# Patient Record
Sex: Female | Born: 1935 | Race: White | Hispanic: No | Smoking: Former smoker
Health system: Southern US, Community
[De-identification: ages and names within clinical notes are randomized; demographics above are authoritative.]

## PROBLEM LIST (undated history)

## (undated) DIAGNOSIS — J302 Other seasonal allergic rhinitis: Secondary | ICD-10-CM

## (undated) DIAGNOSIS — I2510 Atherosclerotic heart disease of native coronary artery without angina pectoris: Secondary | ICD-10-CM

## (undated) DIAGNOSIS — F32A Depression, unspecified: Secondary | ICD-10-CM

## (undated) DIAGNOSIS — R7303 Prediabetes: Secondary | ICD-10-CM

## (undated) DIAGNOSIS — I4891 Unspecified atrial fibrillation: Secondary | ICD-10-CM

## (undated) DIAGNOSIS — K5792 Diverticulitis of intestine, part unspecified, without perforation or abscess without bleeding: Secondary | ICD-10-CM

## (undated) DIAGNOSIS — N390 Urinary tract infection, site not specified: Secondary | ICD-10-CM

## (undated) DIAGNOSIS — E860 Dehydration: Secondary | ICD-10-CM

## (undated) DIAGNOSIS — G4730 Sleep apnea, unspecified: Secondary | ICD-10-CM

## (undated) DIAGNOSIS — C3490 Malignant neoplasm of unspecified part of unspecified bronchus or lung: Secondary | ICD-10-CM

## (undated) DIAGNOSIS — F329 Major depressive disorder, single episode, unspecified: Secondary | ICD-10-CM

## (undated) DIAGNOSIS — C50919 Malignant neoplasm of unspecified site of unspecified female breast: Secondary | ICD-10-CM

## (undated) DIAGNOSIS — J189 Pneumonia, unspecified organism: Secondary | ICD-10-CM

## (undated) DIAGNOSIS — M1990 Unspecified osteoarthritis, unspecified site: Secondary | ICD-10-CM

## (undated) DIAGNOSIS — IMO0001 Reserved for inherently not codable concepts without codable children: Secondary | ICD-10-CM

## (undated) DIAGNOSIS — R350 Frequency of micturition: Secondary | ICD-10-CM

## (undated) DIAGNOSIS — K219 Gastro-esophageal reflux disease without esophagitis: Secondary | ICD-10-CM

## (undated) DIAGNOSIS — E785 Hyperlipidemia, unspecified: Secondary | ICD-10-CM

## (undated) DIAGNOSIS — F0390 Unspecified dementia without behavioral disturbance: Secondary | ICD-10-CM

## (undated) DIAGNOSIS — I10 Essential (primary) hypertension: Secondary | ICD-10-CM

## (undated) DIAGNOSIS — F419 Anxiety disorder, unspecified: Secondary | ICD-10-CM

## (undated) HISTORY — PX: COLON SURGERY: SHX602

## (undated) HISTORY — DX: Prediabetes: R73.03

## (undated) HISTORY — PX: COLONOSCOPY: SHX174

## (undated) HISTORY — PX: CARDIOVERSION: SHX1299

## (undated) HISTORY — PX: BREAST SURGERY: SHX581

## (undated) HISTORY — PX: MASTECTOMY: SHX3

## (undated) HISTORY — DX: Other seasonal allergic rhinitis: J30.2

## (undated) HISTORY — DX: Depression, unspecified: F32.A

## (undated) HISTORY — DX: Dehydration: E86.0

## (undated) HISTORY — DX: Malignant neoplasm of unspecified part of unspecified bronchus or lung: C34.90

## (undated) HISTORY — DX: Diverticulitis of intestine, part unspecified, without perforation or abscess without bleeding: K57.92

## (undated) HISTORY — PX: TONSILLECTOMY: SUR1361

## (undated) HISTORY — DX: Atherosclerotic heart disease of native coronary artery without angina pectoris: I25.10

## (undated) HISTORY — DX: Unspecified atrial fibrillation: I48.91

## (undated) HISTORY — DX: Malignant neoplasm of unspecified site of unspecified female breast: C50.919

## (undated) HISTORY — DX: Major depressive disorder, single episode, unspecified: F32.9

## (undated) HISTORY — DX: Hyperlipidemia, unspecified: E78.5

## (undated) HISTORY — PX: OTHER SURGICAL HISTORY: SHX169

## (undated) HISTORY — DX: Essential (primary) hypertension: I10

---

## 2014-04-16 DIAGNOSIS — E785 Hyperlipidemia, unspecified: Secondary | ICD-10-CM | POA: Insufficient documentation

## 2014-04-16 DIAGNOSIS — K5792 Diverticulitis of intestine, part unspecified, without perforation or abscess without bleeding: Secondary | ICD-10-CM | POA: Insufficient documentation

## 2014-04-16 DIAGNOSIS — I2510 Atherosclerotic heart disease of native coronary artery without angina pectoris: Secondary | ICD-10-CM | POA: Insufficient documentation

## 2014-04-17 DIAGNOSIS — C50919 Malignant neoplasm of unspecified site of unspecified female breast: Secondary | ICD-10-CM | POA: Insufficient documentation

## 2014-04-17 DIAGNOSIS — R7303 Prediabetes: Secondary | ICD-10-CM | POA: Insufficient documentation

## 2014-05-27 ENCOUNTER — Encounter: Payer: Self-pay | Admitting: Internal Medicine

## 2014-05-27 ENCOUNTER — Other Ambulatory Visit: Payer: Self-pay | Admitting: Internal Medicine

## 2014-05-27 ENCOUNTER — Ambulatory Visit (INDEPENDENT_AMBULATORY_CARE_PROVIDER_SITE_OTHER): Payer: Medicare HMO | Admitting: Internal Medicine

## 2014-05-27 VITALS — BP 120/70 | HR 52 | Ht 65.5 in | Wt 169.1 lb

## 2014-05-27 DIAGNOSIS — L659 Nonscarring hair loss, unspecified: Secondary | ICD-10-CM

## 2014-05-27 DIAGNOSIS — I480 Paroxysmal atrial fibrillation: Secondary | ICD-10-CM

## 2014-05-27 DIAGNOSIS — Z79899 Other long term (current) drug therapy: Secondary | ICD-10-CM

## 2014-05-27 DIAGNOSIS — E785 Hyperlipidemia, unspecified: Secondary | ICD-10-CM | POA: Insufficient documentation

## 2014-05-27 DIAGNOSIS — I498 Other specified cardiac arrhythmias: Secondary | ICD-10-CM

## 2014-05-27 DIAGNOSIS — R0989 Other specified symptoms and signs involving the circulatory and respiratory systems: Secondary | ICD-10-CM

## 2014-05-27 DIAGNOSIS — I4891 Unspecified atrial fibrillation: Secondary | ICD-10-CM

## 2014-05-27 DIAGNOSIS — R001 Bradycardia, unspecified: Secondary | ICD-10-CM

## 2014-05-27 DIAGNOSIS — R0609 Other forms of dyspnea: Secondary | ICD-10-CM

## 2014-05-27 DIAGNOSIS — I10 Essential (primary) hypertension: Secondary | ICD-10-CM

## 2014-05-27 MED ORDER — APIXABAN 5 MG PO TABS: 5.0000 mg | ORAL_TABLET | Freq: Two times a day (BID) | ORAL | Status: DC

## 2014-05-27 MED ORDER — METOPROLOL TARTRATE 25 MG PO TABS: 25.0000 mg | ORAL_TABLET | Freq: Two times a day (BID) | ORAL | Status: DC

## 2014-05-27 NOTE — Telephone Encounter (Signed)
Rx refill sent to patient pharmacy   

## 2014-05-27 NOTE — Progress Notes (Signed)
OFFICE NOTE  Chief Complaint:  Establish with cardiologist  Primary Care Physician: Pamela Ou, MD  HPI:  Pamela Mcguire is a pleasant 79 year old female who recently moved from the [place] area back to [place]. She is here today to establish cardiology care. She apparently was recently hospitalized and quite ill in the intensive care unit. She was suffering from diverticulitis and went into atrial fibrillation. She does not recall many of the events however does report that she was cardioverted and started on Rythmol. She must is seen a cardiologist in the hospital however did not have cardiology followup. She has been maintained on this medication and is in sinus rhythm today. She was not started on anticoagulation which is very unusual. She does have a history of hypertension, age greater than 98 and dyslipidemia. Her CHADSVASC score is 2, therefore, long-term anticoagulation is indicated. There are also some discrepancies in her medications. It appears that she has 3 different cholesterol medications.  She denies any chest pain with exertion but does get short of breath. She denies any palpitations, presyncope or syncopal episodes. She reports that she never had a stress test. She denies suffering an MI in the hospital.  PMHx:  Past Medical History  Diagnosis Date  . Hyperlipidemia   . Diverticulitis   . CAD (coronary artery disease)   . Breast cancer     right mastectomy  . AF (atrial fibrillation)   . Hypertension   . Seasonal allergies   . Prediabetes     Past Surgical History  Procedure Laterality Date  . Cardioversion      FAMHx:  Family History  Problem Relation Age of Onset  . Family history unknown: Yes    SOCHx:   reports that she quit smoking about 20 years ago. She has never used smokeless tobacco. She reports that she drinks alcohol. She reports that she does not use illicit drugs.  ALLERGIES:  No Known Allergies  ROS: A comprehensive review  of systems was negative except for: Respiratory: positive for dyspnea on exertion  HOME MEDS: Current Outpatient Prescriptions  Medication Sig Dispense Refill  . Cholecalciferol (VITAMIN D PO) Take by mouth every morning.      . clonazePAM (KLONOPIN) 0.5 MG tablet Take 0.5 mg by mouth at bedtime.      . Cyanocobalamin (VITAMIN B-12 PO) Take 1 capsule by mouth every morning.      . escitalopram (LEXAPRO) 10 MG tablet Take 10 mg by mouth daily.      Marland Kitchen ezetimibe-simvastatin (VYTORIN) 10-40 MG per tablet Take 1 tablet by mouth daily.      Marland Kitchen FOLIC ACID PO Take 1 tablet by mouth 2 (two) times daily.      . Ginger, Zingiber officinalis, (GINGER PO) Take by mouth 2 (two) times daily.      Marland Kitchen loratadine (CLARITIN) 10 MG tablet Take 10 mg by mouth at bedtime as needed for allergies.      . magnesium chloride (SLOW-MAG) 64 MG TBEC SR tablet Take 1 tablet by mouth 3 (three) times daily.      . Omega-3 Fatty Acids (FISH OIL) 1200 MG CAPS Take 2 capsules by mouth daily.      . pantoprazole (PROTONIX) 40 MG tablet Take 40 mg by mouth daily.      . Probiotic Product (PROBIOTIC PO) Take 1 capsule by mouth 2 (two) times daily.      . propafenone (RYTHMOL) 150 MG tablet Take 150 mg by mouth 2 (two)  times daily.      . traZODone (DESYREL) 50 MG tablet Take 50-100 mg by mouth at bedtime.      Marland Kitchen apixaban (ELIQUIS) 5 MG TABS tablet Take 1 tablet (5 mg total) by mouth 2 (two) times daily.  60 tablet  6  . metoprolol tartrate (LOPRESSOR) 25 MG tablet TAKE 1 TABLET BY MOUTH TWICE DAILY  180 tablet  3   No current facility-administered medications for this visit.    LABS/IMAGING: No results found for this or any previous visit (from the past 48 hour(s)). No results found.  VITALS: BP 120/70  Pulse 52  Ht 5' 5.5" (1.664 m)  Wt 169 lb 1.6 oz (76.703 kg)  BMI 27.70 kg/m2  EXAM: General appearance: alert and no distress Neck: no carotid bruit and no JVD Lungs: clear to auscultation bilaterally Heart: regular  rate and rhythm, S1, S2 normal, no murmur, click, rub or gallop Abdomen: soft, non-tender; bowel sounds normal; no masses,  no organomegaly Extremities: extremities normal, atraumatic, no cyanosis or edema Pulses: 2+ and symmetric Skin: Skin color, texture, turgor normal. No rashes or lesions Neurologic: Grossly normal Psych: Mood, affect normal  EKG: Deferred  ASSESSMENT: 1. Paroxysmal atrial fibrillation-maintaining sinus on Rythmol 2. Hypertension-controlled 3. Dyslipidemia-on multiple statins 4. Dyspnea on exertion 5. Relative bradycardia-fatigue  PLAN: 1.   [name] had paroxysmal atrial fibrillation which may have been related to being sick although she was unaware she was out of rhythm. She apparently was cardioverted and started on Rythmol but is on no anticoagulations. This is very unusual as the risk of thrombus does go up after cardioversion. She does have a CHADSVASC score of 2 therefore I would recommend anticoagulation. I will start eloquence 5 mg twice a day. I counseled her today and side effects of the medication and things to watch for. We've given her a card for free 30 day supply. I would like her to see Erasmo Downer in our anticoagulation pharmacist for a more thorough explanation of anticoagulant therapy. She is not currently on aspirin and I would recommend against that. Her blood pressure is controlled on her current medications she does report some fatigue and recent hair loss which may be related to the beta blocker. I recommend decreasing her metoprolol to 25 mg twice daily. She should discontinue atorvastatin and simvastatin and remain on Vytorin, which apparently is being paid for by her insurance company. Finally, she will need a stress test for coronary ischemia evaluation as a possible cause of her A. fib as well as to assure that she is not at increased risk for pro arrhythmias on Rythmol. I would recommend a LexiScan nuclear stress test.  Plan to see her back to  review her records as well as the results of her stress test and to see she's had improvement in her bradycardia and fatigue as well as some of her hair loss in about a month.  Thank you for allowing to participate in her care.  Pixie Casino, MD, [facility] Attending Cardiologist [facility]  HILTY,Kenneth C 05/27/2014, 4:52 PM

## 2014-05-27 NOTE — Patient Instructions (Signed)
Your physician has requested that you have a lexiscan myoview. For further information please visit [email or web address]. Please follow instruction sheet, as given.  Your physician has recommended you make the following change in your medication... 1. STOP atorvastatin and simvastatin (continue vytorin - for cholesterol) 2. DECREASE metoprolol to 25mg  twice daily 3. START eliquis 5mg  twice daily (every 12 hours) - blood thinner  Please have FASTING lab work at [facility] (to check cholesterol)  Please schedule appointment with Pamela Mcguire or Pamela Mcguire (pharmacists) to discuss eliquis  Please schedule an appointment with Dr. Debara Pickett - after your stress test.

## 2014-06-05 ENCOUNTER — Encounter (HOSPITAL_COMMUNITY): Payer: Medicare HMO

## 2014-06-05 ENCOUNTER — Telehealth: Payer: Self-pay | Admitting: *Deleted

## 2014-06-05 NOTE — Telephone Encounter (Signed)
Faxed OV note to get lexiscan myoview approved with [employer]  Dyspnea, new-onset AF, starting antiarrythmic medications

## 2014-06-06 ENCOUNTER — Telehealth (HOSPITAL_COMMUNITY): Payer: Self-pay

## 2014-06-06 NOTE — Telephone Encounter (Signed)
Encounter complete. 

## 2014-06-07 LAB — LIPID PANEL
Cholesterol: 191 mg/dL (ref 0–200)
HDL: 44 mg/dL (ref >39)
LDL Cholesterol: 122 mg/dL — ABNORMAL HIGH (ref 0–99)
TRIGLYCERIDES: 123 mg/dL (ref <150)
Total CHOL/HDL Ratio: 4.3 Ratio
VLDL: 25 mg/dL (ref 0–40)

## 2014-06-10 ENCOUNTER — Telehealth (HOSPITAL_COMMUNITY): Payer: Self-pay

## 2014-06-10 NOTE — Telephone Encounter (Signed)
Encounter complete. 

## 2014-06-11 ENCOUNTER — Ambulatory Visit (INDEPENDENT_AMBULATORY_CARE_PROVIDER_SITE_OTHER): Payer: Medicare HMO | Admitting: Pharmacist

## 2014-06-11 ENCOUNTER — Ambulatory Visit (HOSPITAL_COMMUNITY)
Admission: RE | Admit: 2014-06-11 | Discharge: 2014-06-11 | Disposition: A | Discharge status: Home / Self Care | Payer: Medicare HMO | Source: Ambulatory Visit | Attending: Internal Medicine | Admitting: Internal Medicine

## 2014-06-11 DIAGNOSIS — I4891 Unspecified atrial fibrillation: Secondary | ICD-10-CM

## 2014-06-11 DIAGNOSIS — Z79899 Other long term (current) drug therapy: Secondary | ICD-10-CM

## 2014-06-11 DIAGNOSIS — R0989 Other specified symptoms and signs involving the circulatory and respiratory systems: Secondary | ICD-10-CM | POA: Insufficient documentation

## 2014-06-11 DIAGNOSIS — R0609 Other forms of dyspnea: Secondary | ICD-10-CM | POA: Insufficient documentation

## 2014-06-11 LAB — CBC
HCT: 39.1 % (ref 36.0–46.0)
Hemoglobin: 13.2 g/dL (ref 12.0–15.0)
MCH: 28.8 pg (ref 26.0–34.0)
MCHC: 33.8 g/dL (ref 30.0–36.0)
MCV: 85.2 fL (ref 78.0–100.0)
PLATELETS: 297 10*3/uL (ref 150–400)
RBC: 4.59 MIL/uL (ref 3.87–5.11)
RDW: 14.8 % (ref 11.5–15.5)
WBC: 7.1 10*3/uL (ref 4.0–10.5)

## 2014-06-11 MED ORDER — TECHNETIUM TC 99M SESTAMIBI GENERIC - CARDIOLITE
29.8000 | Freq: Once | INTRAVENOUS | Status: AC | PRN
  Administered 2014-06-11: 29.8 via INTRAVENOUS

## 2014-06-11 MED ORDER — TECHNETIUM TC 99M SESTAMIBI GENERIC - CARDIOLITE
10.6000 | Freq: Once | INTRAVENOUS | Status: AC | PRN
  Administered 2014-06-11: 11 via INTRAVENOUS

## 2014-06-11 MED ORDER — REGADENOSON 0.4 MG/5ML IV SOLN
0.4000 mg | Freq: Once | INTRAVENOUS | Status: AC
  Administered 2014-06-11: 0.4 mg via INTRAVENOUS

## 2014-06-11 MED ORDER — AMINOPHYLLINE 25 MG/ML IV SOLN
75.0000 mg | Freq: Once | INTRAVENOUS | Status: AC
  Administered 2014-06-11: 75 mg via INTRAVENOUS

## 2014-06-11 NOTE — Progress Notes (Signed)
Pt was started on Eliquis for Atrial Fibrillation on May 27, 2014.     Reviewed patients medication list.  Pt is not currently on any combined P-gp and strong CYP3A4 inhibitors/inducers (ketoconazole, traconazole, ritonavir, carbamazepine, phenytoin, rifampin, St. John's wort).  She is taking Ginger, which can increase bleeding risk.   Reviewed labs.  SCr 0.91 , Weight 76 kg, CrCl- 62 mL/min.  Dose appropriate based on CrCl.   Hgb and HCT Within Normal Limits  A full discussion of the nature of anticoagulants has been carried out.  A benefit/risk analysis has been presented to the patient, so that they understand the justification for choosing anticoagulation with Eliquis at this time.  The need for compliance is stressed.  Pt is aware to take the medication twice daily.  Side effects of potential bleeding are discussed, including unusual colored urine or stools, coughing up blood or coffee ground emesis, nose bleeds or serious fall or head trauma.  Discussed signs and symptoms of stroke. The patient should avoid any OTC items containing aspirin or ibuprofen.  Avoid alcohol consumption.   Call if any signs of abnormal bleeding.  Discussed financial obligations and resolved any difficulty in obtaining medication.  Next lab test test in 6 months.

## 2014-06-11 NOTE — Procedures (Addendum)
[place] [address] [place] [address] [phone]  Cardiology Nuclear Med Study  Pamela Mcguire is a 79 y.o. female     MRN : [id number]     DOB: August 20, 1935  Procedure Date: 06/11/2014  Nuclear Med Background Indication for Stress Test:  Evaluation for Ischemia and [facility] History:  CAD;PAF -cardioversion;bradycardia;DVT;No prior respiratory history reported;No prior NUC MPI for comparison. Cardiac Risk Factors: Family History - CAD, History of Smoking, Hypertension, Lipids and Overweight  Symptoms:  Chest Pain, Dizziness, DOE, Fatigue, Light-Headedness, SOB and weakness   Nuclear Pre-Procedure Caffeine/Decaff Intake:  1:00am NPO After: 11am   IV Site: L Forearm  IV 0.9% NS with Angio Cath:  22g  Chest Size (in):  n/a IV Started by: Pamela Course, RN  Height: 5\' 6"  (1.676 m)  Cup Size: C- s/p right mastectomy/lymphectomy;No sticks right arm.  BMI:  Body mass index is 27.29 kg/(m^2). Weight:  169 lb (76.658 kg)   Tech Comments:  NO STICKS RIGHT ARM     Nuclear Med Study 1 or 2 day study: 1 day  Stress Test Type:  Lexiscan  Order Authorizing Provider:  Lyman Bishop, MD   Resting Radionuclide: Technetium 16m Sestamibi  Resting Radionuclide Dose: 10.6 mCi   Stress Radionuclide:  Thallous Chloride Tl201  Stress Radionuclide Dose: 29.8 mCi           Stress Protocol Rest HR: 50 Stress HR: 63  Rest BP: 185/88 Stress BP: 178/88  Exercise Time (min): n/a METS: n/a   Predicted Max HR: 143 bpm % Max HR: 44.06 bpm Rate Pressure Product: 71696  Dose of Adenosine (mg):  n/a Dose of Lexiscan: 0.4 mg  Dose of Atropine (mg): n/a Dose of Dobutamine: n/a mcg/kg/min (at max HR)  Stress Test Technologist: Pamela Mcguire, CCT Nuclear Technologist: Pamela Mcguire, CNMT   Rest Procedure:  Myocardial perfusion imaging was performed at rest 45 minutes following the intravenous administration of Technetium 43m  Sestamibi. Stress Procedure:  The patient received IV Lexiscan 0.4 mg over 15-seconds.  Technetium 26m Sestamibi injected IV at 30-seconds.  Patient experienced SOB and 75 mg of Aminophylline IV was administered. There were no significant changes with Lexiscan.  Quantitative spect images were obtained after a 45 minute delay.  Transient Ischemic Dilatation (Normal <1.22):  1.02  QGS EDV:  63 ml QGS ESV:  19 ml LV Ejection Fraction: 69%  Rest ECG: NSR - Normal EKG  Stress ECG: No significant change from baseline ECG  QPS Raw Data Images:  There is interference from nuclear activity from structures below the diaphragm. This does not affect the ability to read the study. Stress Images:  Normal homogeneous uptake in all areas of the myocardium. Rest Images:  Normal homogeneous uptake in all areas of the myocardium. Subtraction (SDS):  No evidence of ischemia.  Impression Exercise Capacity:  Lexiscan with no exercise. BP Response:  Normal blood pressure response. Clinical Symptoms:  There is dyspnea. ECG Impression:  No significant ECG changes with Lexiscan. Comparison with Prior Nuclear Study: No previous nuclear study performed  Overall Impression:  Normal stress nuclear study.  LV Wall Motion:  NL LV Function; NL Wall Motion; EF 69%  Pixie Casino, MD, [facility] Board Certified in Nuclear Cardiology Attending Cardiologist [place], MD  06/11/2014 4:34 PM

## 2014-06-12 LAB — BASIC METABOLIC PANEL
BUN: 15 mg/dL (ref 6–23)
CO2: 21 meq/L (ref 19–32)
CREATININE: 0.91 mg/dL (ref 0.50–1.10)
Calcium: 9.8 mg/dL (ref 8.4–10.5)
Chloride: 102 mEq/L (ref 96–112)
GLUCOSE: 118 mg/dL — AB (ref 70–99)
Potassium: 5.6 mEq/L — ABNORMAL HIGH (ref 3.5–5.3)
Sodium: 138 mEq/L (ref 135–145)

## 2014-06-12 NOTE — Progress Notes (Signed)
LMTCB for lab/stress test results.

## 2014-06-13 NOTE — Progress Notes (Signed)
LM on daughter Donna's # to return call for lab and test results.

## 2014-07-02 ENCOUNTER — Telehealth: Payer: Self-pay | Admitting: Internal Medicine

## 2014-07-02 NOTE — Telephone Encounter (Signed)
Pt would lie some samples of Eliquis 5 mg and Vytorin please. She did not know the mg for Vytorin.

## 2014-07-02 NOTE — Telephone Encounter (Signed)
Spoke with patient's daughter and informed her samples of Vytorin would be at front desk, we are currently out of Eliquis. Told her she could call [address] and check. Patient is leaving for [place] on Thursday and would really like to have some before she goes, co-pay is very expansive.

## 2014-07-09 ENCOUNTER — Ambulatory Visit: Payer: Medicare HMO | Admitting: Internal Medicine

## 2014-08-12 ENCOUNTER — Ambulatory Visit (INDEPENDENT_AMBULATORY_CARE_PROVIDER_SITE_OTHER): Payer: Medicare HMO | Admitting: Internal Medicine

## 2014-08-12 ENCOUNTER — Encounter: Payer: Self-pay | Admitting: Internal Medicine

## 2014-08-12 VITALS — BP 153/66 | HR 57 | Ht 65.5 in | Wt 163.9 lb

## 2014-08-12 DIAGNOSIS — I498 Other specified cardiac arrhythmias: Secondary | ICD-10-CM

## 2014-08-12 DIAGNOSIS — G4710 Hypersomnia, unspecified: Secondary | ICD-10-CM

## 2014-08-12 DIAGNOSIS — R0609 Other forms of dyspnea: Secondary | ICD-10-CM

## 2014-08-12 DIAGNOSIS — Z79899 Other long term (current) drug therapy: Secondary | ICD-10-CM

## 2014-08-12 DIAGNOSIS — E785 Hyperlipidemia, unspecified: Secondary | ICD-10-CM

## 2014-08-12 DIAGNOSIS — I480 Paroxysmal atrial fibrillation: Secondary | ICD-10-CM

## 2014-08-12 DIAGNOSIS — R001 Bradycardia, unspecified: Secondary | ICD-10-CM

## 2014-08-12 DIAGNOSIS — R5383 Other fatigue: Secondary | ICD-10-CM

## 2014-08-12 DIAGNOSIS — I4891 Unspecified atrial fibrillation: Secondary | ICD-10-CM

## 2014-08-12 DIAGNOSIS — R0683 Snoring: Secondary | ICD-10-CM

## 2014-08-12 DIAGNOSIS — I10 Essential (primary) hypertension: Secondary | ICD-10-CM

## 2014-08-12 DIAGNOSIS — R0989 Other specified symptoms and signs involving the circulatory and respiratory systems: Secondary | ICD-10-CM

## 2014-08-12 DIAGNOSIS — R5381 Other malaise: Secondary | ICD-10-CM

## 2014-08-12 DIAGNOSIS — G4719 Other hypersomnia: Secondary | ICD-10-CM

## 2014-08-12 MED ORDER — ZOLPIDEM TARTRATE 5 MG PO TABS: 5.0000 mg | ORAL_TABLET | Freq: Every evening | ORAL | Status: DC | PRN

## 2014-08-12 MED ORDER — APIXABAN 5 MG PO TABS: 5.0000 mg | ORAL_TABLET | Freq: Two times a day (BID) | ORAL | Status: DC

## 2014-08-12 MED ORDER — EZETIMIBE-SIMVASTATIN 10-40 MG PO TABS: 1.0000 | ORAL_TABLET | Freq: Every day | ORAL | Status: DC

## 2014-08-12 NOTE — Progress Notes (Signed)
OFFICE NOTE  Chief Complaint:  Establish with cardiologist  Primary Care Physician: Florina Ou, MD  HPI:  Pamela Mcguire is a pleasant 79 year old female who recently moved from the [place] area back to [place]. She is here today to establish cardiology care. She apparently was recently hospitalized and quite ill in the intensive care unit. She was suffering from diverticulitis and went into atrial fibrillation. She does not recall many of the events however does report that she was cardioverted and started on Rythmol. She must is seen a cardiologist in the hospital however did not have cardiology followup. She has been maintained on this medication and is in sinus rhythm today. She was not started on anticoagulation which is very unusual. She does have a history of hypertension, age greater than 54 and dyslipidemia. Her CHADSVASC score is 2, therefore, long-term anticoagulation is indicated. There are also some discrepancies in her medications. It appears that she has 3 different cholesterol medications.  She denies any chest pain with exertion but does get short of breath. She denies any palpitations, presyncope or syncopal episodes. She reports that she never had a stress test. She denies suffering an MI in the hospital.  Ms. Fomby returns today for followup. I'm pleased to report her nuclear stress test was negative for ischemia. She has since started Eliquis and is tolerating it well without bleeding problems. We simplified her cholesterol medications and she is currently on Vytorin. She does report poor sleep at night and napping during the day. She says she has to urinate several times at night. She is noted to have a history of snoring but is unclear whether she has any true apnea. She often has trouble falling asleep as well as staying asleep.  PMHx:  Past Medical History  Diagnosis Date  . Hyperlipidemia   . Diverticulitis   . CAD (coronary artery disease)   . Breast  cancer     right mastectomy  . AF (atrial fibrillation)   . Hypertension   . Seasonal allergies   . Prediabetes     Past Surgical History  Procedure Laterality Date  . Cardioversion      FAMHx:  No family history on file.  SOCHx:   reports that she quit smoking about 20 years ago. She has never used smokeless tobacco. She reports that she drinks alcohol. She reports that she does not use illicit drugs.  ALLERGIES:  No Known Allergies  ROS: A comprehensive review of systems was negative except for: Constitutional: positive for insomnia Respiratory: positive for dyspnea on exertion  HOME MEDS: Current Outpatient Prescriptions  Medication Sig Dispense Refill  . apixaban (ELIQUIS) 5 MG TABS tablet Take 1 tablet (5 mg total) by mouth 2 (two) times daily.  56 tablet  0  . Cholecalciferol (VITAMIN D PO) Take by mouth every morning.      . clonazePAM (KLONOPIN) 0.5 MG tablet Take 0.5 mg by mouth at bedtime.      . Cyanocobalamin (VITAMIN B-12 PO) Take 1 capsule by mouth every morning.      . escitalopram (LEXAPRO) 10 MG tablet Take 10 mg by mouth daily.      Marland Kitchen ezetimibe-simvastatin (VYTORIN) 10-40 MG per tablet Take 1 tablet by mouth daily.  28 tablet  0  . FOLIC ACID PO Take 1 tablet by mouth 2 (two) times daily.      Marland Kitchen loratadine (CLARITIN) 10 MG tablet Take 10 mg by mouth at bedtime as needed for allergies.      Marland Kitchen  magnesium chloride (SLOW-MAG) 64 MG TBEC SR tablet Take 1 tablet by mouth 3 (three) times daily.      . metoprolol tartrate (LOPRESSOR) 25 MG tablet TAKE 1 TABLET BY MOUTH TWICE DAILY  180 tablet  3  . Omega-3 Fatty Acids (FISH OIL) 1200 MG CAPS Take 2 capsules by mouth daily.      . pantoprazole (PROTONIX) 40 MG tablet Take 40 mg by mouth daily.      . Probiotic Product (PROBIOTIC PO) Take 1 capsule by mouth 2 (two) times daily.      . propafenone (RYTHMOL) 150 MG tablet Take 150 mg by mouth 2 (two) times daily.      . traZODone (DESYREL) 50 MG tablet Take 50-100 mg  by mouth at bedtime.      Marland Kitchen zolpidem (AMBIEN) 5 MG tablet Take 1 tablet (5 mg total) by mouth at bedtime as needed for sleep.  1 tablet  0   No current facility-administered medications for this visit.    LABS/IMAGING: No results found for this or any previous visit (from the past 48 hour(s)). No results found.  VITALS: BP 153/66  Pulse 57  Ht 5' 5.5" (1.664 m)  Wt 163 lb 14.4 oz (74.345 kg)  BMI 26.85 kg/m2  EXAM: deferred  EKG: Deferred  ASSESSMENT: 1. Paroxysmal atrial fibrillation-maintaining sinus on Rythmol 2. Hypertension-controlled 3. Dyslipidemia-on multiple statins 4. Dyspnea on exertion 5. Relative bradycardia-fatigue 6. Insomnia/somnolence  PLAN: 1.   Pamela Mcguire had a negative nuclear stress test which is reassuring. She is maintaining sinus rhythm on Rythmol. Her blood pressure is well controlled. Cholesterol was noted to be slightly high with an LDL in the 120s, however she just restarted her Vytorin. This will need to be looked at again in 6 months to year. She is having trouble with sleep at night.  I've recommended a sleep study as there is a high correlation with atrial fibrillation and sleep apnea. We will provide a single dose Ambien to take prior to her sleep study in order to help her sleep.  Followup in 6 months.  Pixie Casino, MD, [facility] Attending Cardiologist [facility]  Juniper Snyders C 08/12/2014, 4:11 PM

## 2014-08-12 NOTE — Patient Instructions (Signed)
Your physician has recommended that you have a sleep study. This test records several body functions during sleep, including: brain activity, eye movement, oxygen and carbon dioxide blood levels, heart rate and rhythm, breathing rate and rhythm, the flow of air through your mouth and nose, snoring, body muscle movements, and chest and belly movement. ** this is done at [place] wants you to follow-up in: 6 months with Dr. Debara Pickett. You will receive a reminder letter in the mail two months in advance. If you don't receive a letter, please call our office to schedule the follow-up appointment.

## 2014-08-14 ENCOUNTER — Ambulatory Visit: Payer: Medicare HMO | Admitting: Internal Medicine

## 2014-09-16 ENCOUNTER — Telehealth: Payer: Self-pay | Admitting: Internal Medicine

## 2014-09-16 ENCOUNTER — Telehealth: Payer: Self-pay | Admitting: *Deleted

## 2014-09-16 ENCOUNTER — Other Ambulatory Visit: Payer: Self-pay | Admitting: Internal Medicine

## 2014-09-16 NOTE — Telephone Encounter (Signed)
Pt. called and message left for her to call me back, I did leave a message with the time to be here tomorrow 11:15am for a 11:30 am appt to see Dr. Sallyanne Kuster for Bradycardia

## 2014-09-16 NOTE — Telephone Encounter (Signed)
Pamela Mcguire called in stating that she would like to pick up some Vytorin Samples tomorrow when her mom comes in the morning.

## 2014-09-16 NOTE — Telephone Encounter (Signed)
[name] called in stating the while the pt was at he doctors office, her heart rate began to drop. The PA ran a EKG on her and they found that she had bradychardia. The PA suggested that she be seen as soon as possible. Please call  thanks

## 2014-09-16 NOTE — Telephone Encounter (Signed)
Received call from [facility] who was working up patient at PCP office - EKG performed - sinus bradycardia - patient complains of dizziness with position changes - HR 52  Bradycardia is NOT new for patient. They will fax over EKG for provider to review. Patient has no other complaints.

## 2014-09-17 ENCOUNTER — Ambulatory Visit (INDEPENDENT_AMBULATORY_CARE_PROVIDER_SITE_OTHER): Payer: Medicare HMO | Admitting: Cardiovascular Disease

## 2014-09-17 ENCOUNTER — Encounter: Payer: Self-pay | Admitting: Cardiovascular Disease

## 2014-09-17 VITALS — BP 152/68 | HR 64 | Resp 16 | Ht 65.0 in | Wt 168.1 lb

## 2014-09-17 DIAGNOSIS — E785 Hyperlipidemia, unspecified: Secondary | ICD-10-CM

## 2014-09-17 DIAGNOSIS — Z79899 Other long term (current) drug therapy: Secondary | ICD-10-CM

## 2014-09-17 DIAGNOSIS — I10 Essential (primary) hypertension: Secondary | ICD-10-CM

## 2014-09-17 DIAGNOSIS — R001 Bradycardia, unspecified: Secondary | ICD-10-CM

## 2014-09-17 DIAGNOSIS — I480 Paroxysmal atrial fibrillation: Secondary | ICD-10-CM

## 2014-09-17 MED ORDER — EZETIMIBE-SIMVASTATIN 10-40 MG PO TABS: 1.0000 | ORAL_TABLET | Freq: Every day | ORAL | Status: DC

## 2014-09-17 NOTE — Patient Instructions (Signed)
Your physician has recommended you make the following change in your medication: STOP RYTHMOL.  Your physician recommends that you schedule a follow-up appointment in: As scheduled with Dr. Debara Pickett.

## 2014-09-17 NOTE — Telephone Encounter (Signed)
Samples will be left in Dr. Victorino December pod for patient to pick up at appointment

## 2014-09-17 NOTE — Telephone Encounter (Signed)
Rx was sent to pharmacy electronically. 

## 2014-09-18 ENCOUNTER — Encounter: Payer: Self-pay | Admitting: Cardiovascular Disease

## 2014-09-18 NOTE — Progress Notes (Signed)
Patient ID: Pamela Mcguire, female   DOB: 1935/10/05, 79 y.o.   MRN: [id number]     Reason for office visit Bradycardia, PAF  Pamela Mcguire is Dr. Lysbeth Penner patient, but is seen today for fatigue and bradycardia.  Pamela Mcguire was hospitalized with acute diverticulitis when she had her only known episode of PAF earlier this year. She has not had any recurrence on Rythmol and metoprolol. She takes Eliquis, without bleeding or neurological events. She has treated HTN and hyperlipidemia. Normal Lexiscan Myoview in July. Normal LV function, not sure about left atrium size.  She presented yesterday with fatigue, sleepiness and her ECG showed sinus bradycardia in the low 50s. Today she is better and her heart rate is 64.   No Known Allergies  Current Outpatient Prescriptions  Medication Sig Dispense Refill  . apixaban (ELIQUIS) 5 MG TABS tablet Take 1 tablet (5 mg total) by mouth 2 (two) times daily.  56 tablet  0  . Cholecalciferol (VITAMIN D PO) Take by mouth every morning.      . Cyanocobalamin (VITAMIN B-12 PO) Take 1 capsule by mouth every morning.      . escitalopram (LEXAPRO) 10 MG tablet Take 10 mg by mouth daily.      Marland Kitchen ezetimibe-simvastatin (VYTORIN) 10-40 MG per tablet Take 1 tablet by mouth daily.  28 tablet  0  . FOLIC ACID PO Take 1 tablet by mouth 2 (two) times daily.      Marland Kitchen loratadine (CLARITIN) 10 MG tablet Take 10 mg by mouth at bedtime as needed for allergies.      . magnesium chloride (SLOW-MAG) 64 MG TBEC SR tablet Take 1 tablet by mouth 3 (three) times daily.      . metoprolol tartrate (LOPRESSOR) 25 MG tablet TAKE 1 TABLET BY MOUTH TWICE DAILY  180 tablet  3  . Omega-3 Fatty Acids (FISH OIL) 1200 MG CAPS Take 2 capsules by mouth daily.      . pantoprazole (PROTONIX) 40 MG tablet Take 40 mg by mouth daily.      . Probiotic Product (PROBIOTIC PO) Take 1 capsule by mouth 2 (two) times daily.      . traZODone (DESYREL) 50 MG tablet Take 50-100 mg by mouth at bedtime.      Marland Kitchen  zolpidem (AMBIEN) 5 MG tablet Take 1 tablet (5 mg total) by mouth at bedtime as needed for sleep.  1 tablet  0  . metoprolol tartrate (LOPRESSOR) 25 MG tablet TAKE 1 TABLET BY MOUTH TWICE DAILY  60 tablet  6   No current facility-administered medications for this visit.    Past Medical History  Diagnosis Date  . Hyperlipidemia   . Diverticulitis   . CAD (coronary artery disease)   . Breast cancer     right mastectomy  . AF (atrial fibrillation)   . Hypertension   . Seasonal allergies   . Prediabetes     Past Surgical History  Procedure Laterality Date  . Cardioversion      No family history on file.  History   Social History  . Marital Status: Widowed    Spouse Name: N/A    Number of Children: N/A  . Years of Education: N/A   Occupational History  . Not on file.   Social History Main Topics  . Smoking status: Former Smoker -- 20 years    Quit date: 05/27/1994  . Smokeless tobacco: Never Used  . Alcohol Use: Yes     Comment: 1/2 gallon red  wine/week - limited to special occasions (05/27/14)  . Drug Use: No  . Sexual Activity: Not on file   Other Topics Concern  . Not on file   Social History Narrative  . No narrative on file    Review of systems: Fatigue, chronic class II exertional dyspnea. The patient specifically denies any chest pain at rest or with exertion, dyspnea at rest, orthopnea, paroxysmal nocturnal dyspnea, syncope, palpitations, focal neurological deficits, intermittent claudication, lower extremity edema, unexplained weight gain, cough, hemoptysis or wheezing.  The patient also denies abdominal pain, nausea, vomiting, dysphagia, diarrhea, constipation, polyuria, polydipsia, dysuria, hematuria, frequency, urgency, abnormal bleeding or bruising, fever, chills, unexpected weight changes, mood swings, change in skin or hair texture, change in voice quality, auditory or visual problems, allergic reactions or rashes, new musculoskeletal complaints other  than usual "aches and pains".   PHYSICAL EXAM BP 152/68  Pulse 64  Resp 16  Ht 5\' 5"  (1.651 m)  Wt 168 lb 1.6 oz (76.25 kg)  BMI 27.97 kg/m2  General: Alert, oriented x3, no distress Head: no evidence of trauma, PERRL, EOMI, no exophtalmos or lid lag, no myxedema, no xanthelasma; normal ears, nose and oropharynx Neck: normal jugular venous pulsations and no hepatojugular reflux; brisk carotid pulses without delay and no carotid bruits Chest: clear to auscultation, no signs of consolidation by percussion or palpation, normal fremitus, symmetrical and full respiratory excursions Cardiovascular: normal position and quality of the apical impulse, regular rhythm, normal first and second heart sounds, no murmurs, rubs or gallops Abdomen: no tenderness or distention, no masses by palpation, no abnormal pulsatility or arterial bruits, normal bowel sounds, no hepatosplenomegaly Extremities: no clubbing, cyanosis or edema; 2+ radial, ulnar and brachial pulses bilaterally; 2+ right femoral, posterior tibial and dorsalis pedis pulses; 2+ left femoral, posterior tibial and dorsalis pedis pulses; no subclavian or femoral bruits Neurological: grossly nonfocal   EKG: sinus rhythm (yesterday sinus brady)  Lipid Panel     Component Value Date/Time   CHOL 191 06/06/2014 1108   TRIG 123 06/06/2014 1108   HDL 44 06/06/2014 1108   CHOLHDL 4.3 06/06/2014 1108   VLDL 25 06/06/2014 1108   LDLCALC 122* 06/06/2014 1108    BMET    Component Value Date/Time   NA 138 06/11/2014 1608   K 5.6* 06/11/2014 1608   CL 102 06/11/2014 1608   CO2 21 06/11/2014 1608   GLUCOSE 118* 06/11/2014 1608   BUN 15 06/11/2014 1608   CREATININE 0.91 06/11/2014 1608   CALCIUM 9.8 06/11/2014 1608     ASSESSMENT AND PLAN  Pamela Mcguire has symptomatic medication-induced bradycardia. She has not had recurrence of AF and her only known event occurred during an acute non-cardiac illness.  Stop Rythmol. Continue low dose beta blocker and  anticoagulation. If she has recurrent AF with RVR, may need to consider pacemaker therapy to allow antiarrhythmics.    Orders Placed This Encounter  Procedures  . EKG 12-Lead   No orders of the defined types were placed in this encounter.    Holli Humbles, MD, [place] [phone] office [phone] pager

## 2014-09-21 DIAGNOSIS — E538 Deficiency of other specified B group vitamins: Secondary | ICD-10-CM | POA: Insufficient documentation

## 2014-10-15 ENCOUNTER — Telehealth: Payer: Self-pay | Admitting: Internal Medicine

## 2014-10-15 MED ORDER — EZETIMIBE-SIMVASTATIN 10-40 MG PO TABS: 1.0000 | ORAL_TABLET | Freq: Every day | ORAL | Status: DC

## 2014-10-15 NOTE — Telephone Encounter (Signed)
(  2) samples of VYTORIN 10/40 NOTIFIED Ms Marjo Bicker pick up

## 2014-10-15 NOTE — Telephone Encounter (Signed)
Pt would like some samples of Vytorin 10/40 please.

## 2014-10-28 ENCOUNTER — Telehealth: Payer: Self-pay | Admitting: Internal Medicine

## 2014-10-28 NOTE — Telephone Encounter (Signed)
Pt would like some samples of Vytorin 10/40 please.

## 2014-10-29 MED ORDER — EZETIMIBE-SIMVASTATIN 10-40 MG PO TABS: 1.0000 | ORAL_TABLET | Freq: Every day | ORAL | Status: DC

## 2014-11-03 ENCOUNTER — Ambulatory Visit (HOSPITAL_BASED_OUTPATIENT_CLINIC_OR_DEPARTMENT_OTHER): Payer: Medicare HMO | Attending: Internal Medicine | Admitting: Radiology

## 2014-11-03 ENCOUNTER — Ambulatory Visit: Payer: Medicare HMO | Admitting: Cardiovascular Disease

## 2014-11-03 VITALS — Ht 65.0 in | Wt 170.0 lb

## 2014-11-03 DIAGNOSIS — Z6828 Body mass index (BMI) 28.0-28.9, adult: Secondary | ICD-10-CM | POA: Insufficient documentation

## 2014-11-03 DIAGNOSIS — R0683 Snoring: Secondary | ICD-10-CM | POA: Diagnosis present

## 2014-11-03 DIAGNOSIS — R5383 Other fatigue: Secondary | ICD-10-CM | POA: Insufficient documentation

## 2014-11-03 DIAGNOSIS — G4733 Obstructive sleep apnea (adult) (pediatric): Secondary | ICD-10-CM

## 2014-11-03 DIAGNOSIS — I10 Essential (primary) hypertension: Secondary | ICD-10-CM | POA: Diagnosis not present

## 2014-11-03 DIAGNOSIS — G4719 Other hypersomnia: Secondary | ICD-10-CM

## 2014-11-03 DIAGNOSIS — I480 Paroxysmal atrial fibrillation: Secondary | ICD-10-CM | POA: Insufficient documentation

## 2014-11-03 DIAGNOSIS — G4730 Sleep apnea, unspecified: Secondary | ICD-10-CM | POA: Diagnosis present

## 2014-11-03 DIAGNOSIS — I4891 Unspecified atrial fibrillation: Secondary | ICD-10-CM

## 2014-11-14 NOTE — Addendum Note (Signed)
Addended by: Shelva Majestic A on: 11/14/2014 02:57 PM   Modules accepted: Level of Service

## 2014-11-14 NOTE — Sleep Study (Signed)
NAME: Pamela Mcguire DATE OF BIRTH:  [birth year] MEDICAL RECORD NUMBER [id number]  LOCATION: [facility]  PHYSICIAN: KELLY,THOMAS A  DATE OF STUDY: 11/03/2014  SLEEP STUDY TYPE: Nocturnal Polysomnogram               REFERRING PHYSICIAN: Pixie Casino., MD  INDICATION FOR STUDY:  Pamela Mcguire is a 79 year old female who has a history of paroxysmal atrial fibrillation, hypertension, and is referred for sleep study to evaluate for obstructive sleep apnea.  She has had episodes of witnessed apnea, snoring, and daytime fatigue.  EPWORTH SLEEPINESS SCORE:  7  HEIGHT: 5\' 5"  (165.1 cm)  WEIGHT: 170 lb (77.111 kg)    Body mass index is 28.29 kg/(m^2).  NECK SIZE: 14.5 in.  MEDICATIONS:   Medications zolpidem (AMBIEN) 5 MG tablet 5 mg, At bedtime PRN traZODone (DESYREL) 50 MG tablet 50-100 mg, Daily at bedtime Probiotic Product (PROBIOTIC PO) 1 capsule, 2 times daily pantoprazole (PROTONIX) 40 MG tablet 40 mg, Daily Omega-3 Fatty Acids (FISH OIL) 1200 MG CAPS 2 capsule, Daily metoprolol tartrate (LOPRESSOR) 25 MG tablet metoprolol tartrate (LOPRESSOR) 25 MG tablet magnesium chloride (SLOW-MAG) 64 MG TBEC SR tablet 1 tablet, 3 times daily loratadine (CLARITIN) 10 MG tablet 10 mg, At bedtime PRN FOLIC ACID PO 1 tablet, 2 times daily ezetimibe-simvastatin (VYTORIN) 10-40 MG per tablet 1 tablet, Daily escitalopram (LEXAPRO) 10 MG tablet 10 mg, Daily Cyanocobalamin (VITAMIN B-12 PO) 1 capsule, Every morning - 10a Cholecalciferol (VITAMIN D PO) Every morning - 10a apixaban (ELIQUIS) 5 MG TABS tablet 5 mg, 2 times daily   SLEEP ARCHITECTURE:   The patient did not meet split-night criteria per standing protocol in the laboratory.  A diagnostic polysomnogram was performed.  Total recording time was 384.5 minutes.  The patient slept for 214 minutes out of 333.5 minutes of sleep period time.  There is reduced sleep efficiency at 55.7 percent.  Sleep latency was prolonged at 51 minutes.   She was unable to achieve REM sleep.  The patient spent 6.5 minutes in stage I (7.7%), and 197.5 minutes in stage II (92.3%).  There was no evidence for slow wave or REM sleep.   RESPIRATORY DATA:  During the sleep period time there were 39 obstructive apneas, 123 hypopneas and  one mixed apnea.  The apnea hypopnea index (AHI) was 45.7/hr, which places the patient in the category of severe obstructive sleep apnea.  The respiratory disturbance index (RDI) was 57.5/hr.  There were 184 arousals with an index of 51.6.  Patient was observed to snore mildly.  OXYGEN DATA:    The baseline oxygen saturation was 98%.  The mean oxygen saturation with sleep was 88.7%.  The oxygen nadir with non-REM sleep was 79%.  REM sleep was not achieved.   CARDIAC DATA:   The patient was in sinus rhythm but during the night there were several episodes of PACs and isolated PVCs with several short salvos of transient atrial tachycardia.  MOVEMENT/PARASOMNIA:   A total of 44 periodic limb movements were observed, with an index of 12.3 with 2.5 to arousal  IMPRESSION/ RECOMMENDATION:    Severe obstructive sleep apnea/hypopnea syndrome with an AHI of 45.7 per hour and RDI of 57.5 per hour. Respiratory events with frequent oxygen desaturation to a nadir of 79% with non-REM sleep. Mild snoring. Reduced sleep efficiency. Nocturnal myoclonus with questionable clinical significance. The arousal index was severely abnormal. Transient PACs and short bursts of atrial arrhythmia with isolated PVCs.  CPAP  titration is recommended in light of the severity of the patient's sleep apnea and its symptomatic nature in this patient with a history of paroxysmal atrial fibrillation and hypertension. Efforts should be made to optimize nasal/oral pharyngeal patency.  The patient should be counseled on good sleep hygiene.    [place], American Board of Sleep Medicine  ELECTRONICALLY SIGNED ON:  11/14/2014, 2:32  PM [place] PH: [phone]   FX: [phone] [place]

## 2014-11-17 ENCOUNTER — Telehealth: Payer: Self-pay | Admitting: Internal Medicine

## 2014-11-17 ENCOUNTER — Telehealth: Payer: Self-pay | Admitting: *Deleted

## 2014-11-17 DIAGNOSIS — G4733 Obstructive sleep apnea (adult) (pediatric): Secondary | ICD-10-CM

## 2014-11-17 NOTE — Telephone Encounter (Signed)
Left message to return a call to discuss sleep study results. 

## 2014-11-17 NOTE — Telephone Encounter (Signed)
Returning your call. °

## 2014-11-17 NOTE — Telephone Encounter (Signed)
Called mobile # - spoke with [name] (daughter). Was informed that patient lives w/her part time and her other sister [name] part time. Left VM for [name] to return call.

## 2014-11-17 NOTE — Telephone Encounter (Signed)
Provided PSG results to daughter [name]. She will informed [place]. CPAP titration ordered. They are aware that sleep center will contact them for an appmt

## 2014-11-27 ENCOUNTER — Telehealth: Payer: Self-pay | Admitting: Internal Medicine

## 2014-11-27 ENCOUNTER — Other Ambulatory Visit: Payer: Self-pay | Admitting: *Deleted

## 2014-11-27 MED ORDER — EZETIMIBE-SIMVASTATIN 10-40 MG PO TABS: 1.0000 | ORAL_TABLET | Freq: Every day | ORAL | Status: DC

## 2014-11-27 NOTE — Telephone Encounter (Signed)
Pt's daughter called in wanting to know if there are any samples of Vytorin 10/40 available for the pt. If not she will need some called in to the pharmacy. Please call  Thanks

## 2014-11-27 NOTE — Telephone Encounter (Signed)
Samples left at front. Pt's daughter notified. Also sending in Rx to pharmacy of choice.

## 2014-12-01 ENCOUNTER — Telehealth: Payer: Self-pay | Admitting: Internal Medicine

## 2014-12-01 NOTE — Telephone Encounter (Signed)
Daughter came in requested follow up appt for her mother.  Patient had sleep study and was told she needed cpap, etc. What does she need to do?

## 2014-12-01 NOTE — Telephone Encounter (Signed)
A CPAP titration has been ordered - just needs to be scheduled.   Can you schedule this Deedie?

## 2014-12-02 ENCOUNTER — Telehealth: Payer: Self-pay | Admitting: Internal Medicine

## 2014-12-02 NOTE — Telephone Encounter (Signed)
Joycelyn Schmid is calling in reference to a drug change for a pt. Please call  Thanks

## 2014-12-02 NOTE — Telephone Encounter (Signed)
Pharmacy to send fax requesting med change due to patient insurer's preference. Will put to Dr. Lysbeth Penner attention.

## 2014-12-24 ENCOUNTER — Telehealth: Payer: Self-pay | Admitting: Internal Medicine

## 2014-12-24 MED ORDER — APIXABAN 5 MG PO TABS: 5.0000 mg | ORAL_TABLET | Freq: Two times a day (BID) | ORAL | Status: DC

## 2014-12-24 NOTE — Telephone Encounter (Signed)
Samples at front desk, called and informed requesting party.

## 2014-12-24 NOTE — Telephone Encounter (Signed)
Pamela Mcguire is calling to see if we have samples of Vytorin and Eliquis . Please call  If you have any questions   Thanks

## 2015-01-05 ENCOUNTER — Other Ambulatory Visit: Payer: Self-pay

## 2015-01-05 MED ORDER — METOPROLOL TARTRATE 25 MG PO TABS: 25.0000 mg | ORAL_TABLET | Freq: Two times a day (BID) | ORAL | Status: DC

## 2015-01-05 NOTE — Telephone Encounter (Signed)
Rx(s) sent to pharmacy electronically. Request for Lexapro routed to Dr Debara Pickett to approve.

## 2015-01-06 MED ORDER — ESCITALOPRAM OXALATE 10 MG PO TABS: 10.0000 mg | ORAL_TABLET | Freq: Every day | ORAL | Status: DC

## 2015-01-24 ENCOUNTER — Other Ambulatory Visit: Payer: Self-pay | Admitting: Internal Medicine

## 2015-01-26 ENCOUNTER — Other Ambulatory Visit: Payer: Self-pay

## 2015-01-26 MED ORDER — APIXABAN 5 MG PO TABS: 5.0000 mg | ORAL_TABLET | Freq: Two times a day (BID) | ORAL | Status: DC

## 2015-01-26 NOTE — Telephone Encounter (Signed)
Rx(s) sent to pharmacy electronically.  

## 2015-01-27 ENCOUNTER — Telehealth: Payer: Self-pay | Admitting: Internal Medicine

## 2015-01-27 NOTE — Telephone Encounter (Signed)
Returned call to patient's daughter Drucie Opitz 10/40 mg samples left at front desk of [facility] office.

## 2015-01-27 NOTE — Telephone Encounter (Signed)
[name] is calling to see if Pamela Mcguire can get some Vytorin 10/40. Please call    Thanks

## 2015-02-09 ENCOUNTER — Ambulatory Visit (INDEPENDENT_AMBULATORY_CARE_PROVIDER_SITE_OTHER): Payer: Medicare HMO | Admitting: Internal Medicine

## 2015-02-09 ENCOUNTER — Encounter: Payer: Self-pay | Admitting: Internal Medicine

## 2015-02-09 VITALS — BP 118/74 | HR 66 | Ht 65.5 in | Wt 182.6 lb

## 2015-02-09 DIAGNOSIS — Z79899 Other long term (current) drug therapy: Secondary | ICD-10-CM

## 2015-02-09 DIAGNOSIS — R001 Bradycardia, unspecified: Secondary | ICD-10-CM

## 2015-02-09 DIAGNOSIS — I10 Essential (primary) hypertension: Secondary | ICD-10-CM

## 2015-02-09 DIAGNOSIS — I480 Paroxysmal atrial fibrillation: Secondary | ICD-10-CM

## 2015-02-09 DIAGNOSIS — E785 Hyperlipidemia, unspecified: Secondary | ICD-10-CM

## 2015-02-09 DIAGNOSIS — R0609 Other forms of dyspnea: Secondary | ICD-10-CM

## 2015-02-09 DIAGNOSIS — M2550 Pain in unspecified joint: Secondary | ICD-10-CM

## 2015-02-09 NOTE — Patient Instructions (Signed)
You have been referred to Dr. Beryl Meager for rheumatology.  Your physician wants you to follow-up in: 6 months or sooner if needed with Dr. Debara Pickett. You will receive a reminder letter in the mail two months in advance. If you don't receive a letter, please call our office to schedule the follow-up appointment.

## 2015-02-09 NOTE — Progress Notes (Signed)
OFFICE NOTE  Chief Complaint:  Positional dizziness  Primary Care Physician: Florina Ou, MD  HPI:  Pamela Mcguire is a pleasant 80 year old female who recently moved from the [place] area back to [place]. She is here today to establish cardiology care. She apparently was recently hospitalized and quite ill in the intensive care unit. She was suffering from diverticulitis and went into atrial fibrillation. She does not recall many of the events however does report that she was cardioverted and started on Rythmol. She must is seen a cardiologist in the hospital however did not have cardiology followup. She has been maintained on this medication and is in sinus rhythm today. She was not started on anticoagulation which is very unusual. She does have a history of hypertension, age greater than 66 and dyslipidemia. Her CHADSVASC score is 2, therefore, long-term anticoagulation is indicated. There are also some discrepancies in her medications. It appears that she has 3 different cholesterol medications.  She denies any chest pain with exertion but does get short of breath. She denies any palpitations, presyncope or syncopal episodes. She reports that she never had a stress test. She denies suffering an MI in the hospital.  Pamela Mcguire returns today for followup. I'm pleased to report her nuclear stress test was negative for ischemia. She has since started Eliquis and is tolerating it well without bleeding problems. We simplified her cholesterol medications and she is currently on Vytorin. She does report poor sleep at night and napping during the day. She says she has to urinate several times at night. She is noted to have a history of snoring but is unclear whether she has any true apnea. She often has trouble falling asleep as well as staying asleep.  I saw Pamela Mcguire back in the office today. She is reporting some mild positional dizziness. She also reports of chest pain when laying down at  night. Although she recently had a stress test as above which was negative. She continues to be fatigued throughout the day. She did undergo a sleep study which was markedly abnormal demonstrating an AHI of close to 50. She is scheduled to have a CPAP titration on March 28. I think this will make a significant difference in her symptoms. She reports no recurrence of her atrial fibrillation and is off Rythmol.  PMHx:  Past Medical History  Diagnosis Date  . Hyperlipidemia   . Diverticulitis   . CAD (coronary artery disease)   . Breast cancer     right mastectomy  . AF (atrial fibrillation)   . Hypertension   . Seasonal allergies   . Prediabetes     Past Surgical History  Procedure Laterality Date  . Cardioversion      FAMHx:  No family history on file.  SOCHx:   reports that she quit smoking about 20 years ago. She has never used smokeless tobacco. She reports that she drinks alcohol. She reports that she does not use illicit drugs.  ALLERGIES:  No Known Allergies  ROS: A comprehensive review of systems was negative except for: Constitutional: positive for insomnia and daytime hypersomnolence Respiratory: positive for dyspnea on exertion  HOME MEDS: Current Outpatient Prescriptions  Medication Sig Dispense Refill  . Cholecalciferol (VITAMIN D PO) Take by mouth every morning.    . Cyanocobalamin (VITAMIN B-12 PO) Take 1 capsule by mouth every morning.    Marland Kitchen ELIQUIS 5 MG TABS tablet TAKE 1 TABLET BY MOUTH TWICE DAILY 60 tablet 5  . escitalopram (  LEXAPRO) 10 MG tablet Take 1 tablet (10 mg total) by mouth daily. 30 tablet 5  . ezetimibe-simvastatin (VYTORIN) 10-40 MG per tablet Take 1 tablet by mouth daily. 90 tablet 2  . FOLIC ACID PO Take 1 tablet by mouth 2 (two) times daily.    Marland Kitchen loratadine (CLARITIN) 10 MG tablet Take 10 mg by mouth at bedtime as needed for allergies.    . magnesium chloride (SLOW-MAG) 64 MG TBEC SR tablet Take 1 tablet by mouth 3 (three) times daily.    .  metoprolol tartrate (LOPRESSOR) 25 MG tablet Take 1 tablet (25 mg total) by mouth 2 (two) times daily. 180 tablet 2  . Omega-3 Fatty Acids (FISH OIL) 1200 MG CAPS Take 2 capsules by mouth daily.    . pantoprazole (PROTONIX) 40 MG tablet Take 40 mg by mouth daily.    . Probiotic Product (PROBIOTIC PO) Take 1 capsule by mouth 2 (two) times daily.    . traZODone (DESYREL) 50 MG tablet Take 50-100 mg by mouth at bedtime.     No current facility-administered medications for this visit.    LABS/IMAGING: No results found for this or any previous visit (from the past 48 hour(s)). No results found.  VITALS: BP 118/74 mmHg  Pulse 66  Ht 5' 5.5" (1.664 m)  Wt 182 lb 9.6 oz (82.827 kg)  BMI 29.91 kg/m2  EXAM: General appearance: alert and no distress Neck: no carotid bruit and no JVD Lungs: clear to auscultation bilaterally Heart: regular rate and rhythm, S1, S2 normal, no murmur, click, rub or gallop Abdomen: soft, non-tender; bowel sounds normal; no masses,  no organomegaly Extremities: extremities normal, atraumatic, no cyanosis or edema Pulses: 2+ and symmetric Skin: Skin color, texture, turgor normal. No rashes or lesions Neurologic: Grossly normal Psych: Appears fatigued  EKG: Sinus rhythm at 66  ASSESSMENT: 1. Paroxysmal atrial fibrillation-maintaining sinus rhythm 2. Hypertension-controlled 3. Dyslipidemia-on multiple statins 4. Dyspnea on exertion 5. Relative bradycardia-fatigue 6. Insomnia/somnolence - severe sleep apnea, awaiting CPAP titration  PLAN: 1.   Pamela Mcguire had a negative nuclear stress test which is reassuring. She is maintaining sinus rhythm - but is no longer on antiarrhythmic therapy. Her blood pressure is well controlled. Cholesterol was noted to be slightly high with an LDL in the 120s, however she just restarted her Vytorin. She had a sleep study which was markedly abnormal and is awaiting CPAP titration. I think this will help her significantly with her  insomnia/somnolence. I recommend we continue to pursue this now and continue her other medications. She is not having any bleeding problems on Eliquis.  Followup in 6 months.  Pixie Casino, MD, [facility] Attending Cardiologist [facility]  HILTY,Kenneth C 02/09/2015, 6:16 PM

## 2015-02-16 ENCOUNTER — Telehealth: Payer: Self-pay | Admitting: Internal Medicine

## 2015-02-16 NOTE — Telephone Encounter (Signed)
Deferred to Dr. Debara Pickett to advise on refill - was taken OFF med list at last [place] 3/14 - no refills by Dr. Debara Pickett noted in [facility]

## 2015-02-16 NOTE — Telephone Encounter (Signed)
Defer to PCP.  -Dr. Lemmie Evens

## 2015-02-16 NOTE — Telephone Encounter (Signed)
Pt decoded she wants a prescription for Ambien. Please call to [facility] in [place].

## 2015-02-17 NOTE — Telephone Encounter (Signed)
Left message on Denise's (daughter) VM that medication will not be refilled by Dr. Debara Pickett - defer to PCP

## 2015-02-23 ENCOUNTER — Ambulatory Visit (HOSPITAL_BASED_OUTPATIENT_CLINIC_OR_DEPARTMENT_OTHER): Payer: Medicare HMO | Attending: Internal Medicine | Admitting: Radiology

## 2015-02-23 VITALS — Ht 65.5 in | Wt 182.0 lb

## 2015-02-23 DIAGNOSIS — G4733 Obstructive sleep apnea (adult) (pediatric): Secondary | ICD-10-CM

## 2015-03-04 ENCOUNTER — Other Ambulatory Visit: Payer: Self-pay | Admitting: Internal Medicine

## 2015-03-04 MED ORDER — EZETIMIBE-SIMVASTATIN 10-40 MG PO TABS: 1.0000 | ORAL_TABLET | Freq: Every day | ORAL | Status: DC

## 2015-03-04 NOTE — Telephone Encounter (Signed)
E-sent medication and notified  [name]

## 2015-03-04 NOTE — Telephone Encounter (Signed)
°  1. Which medications need to be refilled? Vytorin 10/40mg    2. Which pharmacy is medication to be sent to?[facility] in [place]   3. Do they need a 30 day or 90 day supply? 90  4. Would they like a call back once the medication has been sent to the pharmacy? yes

## 2015-03-08 NOTE — Sleep Study (Signed)
   NAME: Pamela Mcguire DATE OF BIRTH:  1935-08-28 MEDICAL RECORD NUMBER [id number]  LOCATION: [facility]  PHYSICIAN: KELLY,THOMAS A  DATE OF STUDY: 02/23/2015  SLEEP STUDY TYPE: Positive Airway Pressure Titration               REFERRING PHYSICIAN: Pixie Casino, MD  INDICATION FOR STUDY:  [name] is a 80 year old female has a history of PAF, and hypertension.  She was found to have severe obstructive sleep apnea with AHI of 45.7 per hour and RDI of 57.5 per hour associated with an oxygen desaturation 79%.  She is now referred for CPAP titration.  EPWORTH SLEEPINESS SCORE:  7 HEIGHT: 5' 5.5" (166.4 cm)  WEIGHT: 182 lb (82.555 kg)    Body mass index is 29.82 kg/(m^2).  NECK SIZE: 14 in.  MEDICATIONS:  traZODone (DESYREL) 50 MG tablet 50-100 mg, Daily at bedtime Probiotic Product (PROBIOTIC PO) 1 capsule, 2 times daily pantoprazole (PROTONIX) 40 MG tablet 40 mg, Daily Omega-3 Fatty Acids (FISH OIL) 1200 MG CAPS 2 capsule, Daily metoprolol tartrate (LOPRESSOR) 25 MG tablet 25 mg, 2 times daily magnesium chloride (SLOW-MAG) 64 MG TBEC SR tablet 1 tablet, 3 times daily loratadine (CLARITIN) 10 MG tablet 10 mg, At bedtime PRN FOLIC ACID PO 1 tablet, 2 times daily ezetimibe-simvastatin (VYTORIN) 10-40 MG per tablet 1 tablet, Daily escitalopram (LEXAPRO) 10 MG tablet 10 mg, Daily ELIQUIS 5 MG TABS tablet Cyanocobalamin (VITAMIN B-12 PO) 1 capsule, Every morning - 10a Cholecalciferol (VITAMIN D PO) Every morning    SLEEP ARCHITECTURE:  Total recording time was 367 minutes.  Total sleep time 277 minutes of the sleep period of time of 338.5 minutes.  Sleep efficiency 75.5.  Sleep latency was 28.5 minutes.  There was prolonged latency from sleep at 277.5 minutes.  The patient's slept for 26 minutes (9.4%) in stage I, 190 minutes (68.6%), stage II, 0 minutes in stage III, and 61 minutes (22%) in REM sleep.  70 minutes or 25.3% were spent supine.  RESPIRATORY DATA:  CPAP therapy  was started at 570 m water pressure and was titrated up to 13 cm water pressure.  The AHI at 13 cm water pressure was 0.9 per hour.  Initially there was mild to moderate snoring, which resolved with CPAP therapy.  OXYGEN DATA:  The baseline oxygen was 98%.  The lowest oxygen desaturation with non-REM sleep was 87% and with REM sleep 92%.  CARDIAC DATA:  The patient was in sinus rhythm but there were occasional PVCs and PACs throughout the evening.  MOVEMENT/PARASOMNIA:  There were frequent.  Lipid movements with a total count of 200, and an index of 43.3.  There were 80.  Neck lipid movements to arousal with an index of 17.3.  IMPRESSION/ RECOMMENDATION:   The patient has previously diagnosed severe obstructive sleep apnea/hypopnea syndrome on diagnostic polysomnography.  Recommend initial CPAP pressure with C-Flex/APR 3 at 13 cm water pressure with heated humidification.  The patient tolerated CPAP well with resolution of snoring.  Recommend download in 30 days and sleep clinic evaluation.   [place], American Board of Sleep Medicine  ELECTRONICALLY SIGNED ON:  03/08/2015, 4:26 PM [place] PH: [phone]   FX: [phone] [place]

## 2015-03-08 NOTE — Addendum Note (Signed)
Addended by: Shelva Majestic A on: 03/08/2015 04:35 PM   Modules accepted: Level of Service

## 2015-03-11 ENCOUNTER — Telehealth: Payer: Self-pay | Admitting: *Deleted

## 2015-03-11 NOTE — Telephone Encounter (Signed)
Spoke with Coralyn Mark @ [facility] to ask why the patient 's sleep study was scheduled so far from the time of order. Wondered if she had rescheduled any of the appointments.. She informs me that the first study was ordered in September. Patient was not scheduled until December. Titration study was ordered in late December. Not scheduled until March due to their schedule being full. I will attempt to see if the visit from Dr. Sallyanne Kuster 09/11/14 office visit will work. If not the patient will have to star over in order for Medicare to cover her equipment. Drs. Claiborne Billings and [place] will be informed of these events.

## 2015-03-25 ENCOUNTER — Telehealth: Payer: Self-pay | Admitting: Internal Medicine

## 2015-03-25 NOTE — Telephone Encounter (Signed)
Returned call to daughter, [name]. Informed her we are unable to see the chest xray in [facility]. She states she got another MD to authorize CT.   [name] requested samples of vytorin for mother  Medication samples have been provided to the patient.  Drug name: vytorin 10/40  Qty: 28  LOT: [id number]  Exp.Date: 05/2015  Samples left at front desk for patient pick-up at [facility] appmt date. Patient notified.  Sheral Apley M 1:56 PM 03/25/2015

## 2015-03-25 NOTE — Telephone Encounter (Signed)
FORWARD TO DR HILTY AND JENNA RN

## 2015-03-25 NOTE — Telephone Encounter (Signed)
Pt had abnormal chest xray,there is a recommendation for a CT. Pt need a medical doctor to authorize the CT Scan,.please call.

## 2015-03-27 ENCOUNTER — Telehealth: Payer: Self-pay | Admitting: *Deleted

## 2015-03-27 ENCOUNTER — Ambulatory Visit (INDEPENDENT_AMBULATORY_CARE_PROVIDER_SITE_OTHER): Payer: Medicare HMO | Admitting: Cardiovascular Disease

## 2015-03-27 ENCOUNTER — Other Ambulatory Visit: Payer: Self-pay | Admitting: *Deleted

## 2015-03-27 ENCOUNTER — Encounter: Payer: Self-pay | Admitting: Cardiovascular Disease

## 2015-03-27 VITALS — BP 120/80 | HR 68 | Ht 65.0 in | Wt 187.0 lb

## 2015-03-27 DIAGNOSIS — I10 Essential (primary) hypertension: Secondary | ICD-10-CM

## 2015-03-27 DIAGNOSIS — R918 Other nonspecific abnormal finding of lung field: Secondary | ICD-10-CM | POA: Insufficient documentation

## 2015-03-27 DIAGNOSIS — Z79899 Other long term (current) drug therapy: Secondary | ICD-10-CM

## 2015-03-27 DIAGNOSIS — I480 Paroxysmal atrial fibrillation: Secondary | ICD-10-CM | POA: Diagnosis not present

## 2015-03-27 DIAGNOSIS — G4733 Obstructive sleep apnea (adult) (pediatric): Secondary | ICD-10-CM | POA: Insufficient documentation

## 2015-03-27 DIAGNOSIS — E785 Hyperlipidemia, unspecified: Secondary | ICD-10-CM

## 2015-03-27 NOTE — Telephone Encounter (Signed)
Received referral for Pamela Mcguire today.  Patient needs PET scan.  I called referring office, [facility].  I asked them to order and they stated they would.  I called patient to give her appt time and place.  I spoke with the daughter and gave her appt.  I also asked that she bring her CT Chest scan from [facility].  Daughter stated they had it already.  Daughter was thankful for the call and appt.

## 2015-03-27 NOTE — Progress Notes (Signed)
Patient ID: Pamela Mcguire, female   DOB: 1935-09-03, 80 y.o.   MRN: [id number]     HPI: Pamela Mcguire is a 81 y.o. female who presents to the office today for a sleep evaluation prior to initiating CPAP therapy.  As 35 or is a 80 year old female who was originally from [place], [place].  She has a history of hypertension, hyperlipidemia, and paroxysmal atrial fibrillation.  She was seen by Dr. Debara Pickett in September 2015 and with her history of atrial fibrillation as well as symptoms very concerning for obstructive sleep apnea.  She was referred to [facility] for a sleep study.  Due to scheduling constraints, her diagnostic polysomnogram was not able to be done until 11/03/2014.  This was done as a polysomnogram and this him and stated severe obstructive sleep apnea/hypoxia syndrome with an AHI of 45.7 per hour and RDI 57.5 per hour.  She has significant oxygen desaturation to 79% with non-REM sleep.  There was evidence for mild snoring, reduced sleep efficiency, and there was evidence for periodic limb movements with an index of 12.3 with 12.5 to arousal.  She had a severely abnormal arousal index and she was in sinus rhythm but had transient PACs and short burst of atrial arrhythmia with isolated PVCs.  She was scheduled for a CPAP titration trial.  However, due to the laboratory being overbooked she could not get the CPAP study done until 02/23/2015.  She was titrated up to 13 cm water pressure and had definite benefit with an AHI of 13 cm at 0.9 per hour and resolution of previous mild/moderate snoring.  She was able to sleep for 22% of the night in REM sleep.  [facility].  I'm seeing her face-to-face today to review her sleep findings with her.  She remains significantly symptomatic with her sleep apnea.  She presently goes to bed at 10 PM and wakes up at 9 AM.  She often has to get up 5 times per night to go to the bathroom.  She is snoring.  Her  sleep is nonrestorative.  Although her Epworth Sleepiness Scale score calculated at 7, she feels that she has to take daytime naps due to hypersomnolence.   Past Medical History  Diagnosis Date  . Hyperlipidemia   . Diverticulitis   . CAD (coronary artery disease)   . Breast cancer     right mastectomy  . AF (atrial fibrillation)   . Hypertension   . Seasonal allergies   . Prediabetes     Past Surgical History  Procedure Laterality Date  . Cardioversion      No Known Allergies  Current Outpatient Prescriptions  Medication Sig Dispense Refill  . Cholecalciferol (VITAMIN D PO) Take by mouth every morning.    . Cyanocobalamin (VITAMIN B-12 PO) Take 1 capsule by mouth every morning.    Marland Kitchen ELIQUIS 5 MG TABS tablet TAKE 1 TABLET BY MOUTH TWICE DAILY 60 tablet 5  . escitalopram (LEXAPRO) 10 MG tablet Take 1 tablet (10 mg total) by mouth daily. 30 tablet 5  . ezetimibe-simvastatin (VYTORIN) 10-40 MG per tablet Take 1 tablet by mouth daily. 90 tablet 3  . loratadine (CLARITIN) 10 MG tablet Take 10 mg by mouth at bedtime as needed for allergies.    . metoprolol tartrate (LOPRESSOR) 25 MG tablet Take 1 tablet (25 mg total) by mouth 2 (two) times daily. 180 tablet 2  . Omega-3 Fatty Acids (FISH OIL) 1200  MG CAPS Take 2 capsules by mouth daily.    . pantoprazole (PROTONIX) 40 MG tablet Take 40 mg by mouth daily.    . Probiotic Product (PROBIOTIC PO) Take 1 capsule by mouth 2 (two) times daily.    . traZODone (DESYREL) 50 MG tablet Take 50-100 mg by mouth at bedtime.     No current facility-administered medications for this visit.    History   Social History  . Marital Status: Widowed    Spouse Name: N/A  . Number of Children: N/A  . Years of Education: N/A   Occupational History  . Not on file.   Social History Main Topics  . Smoking status: Former Smoker -- 20 years    Quit date: 05/27/1994  . Smokeless tobacco: Never Used  . Alcohol Use: 0.0 oz/week    0 Standard drinks or  equivalent per week     Comment: 1/2 gallon red wine/week - limited to special occasions (05/27/14)  . Drug Use: No  . Sexual Activity: Not on file   Other Topics Concern  . Not on file   Social History Narrative    History reviewed. No pertinent family history.  ROS General: Negative; No fevers, chills, or night sweats HEENT: Negative; No changes in vision or hearing, sinus congestion, difficulty swallowing Pulmonary: Negative; No cough, wheezing, shortness of breath, hemoptysis Cardiovascular: See HPI: History of paroxysmal atrial fibrillation No chest pain, presyncope, syncope, GI: Negative; No nausea, vomiting, diarrhea, or abdominal pain GU: Negative; No dysuria, hematuria, or difficulty voiding Musculoskeletal: Negative; no myalgias, joint pain, or weakness Hematologic: Negative; no easy bruising, bleeding Endocrine: Negative; no heat/cold intolerance; no diabetes, Neuro: Negative; no changes in balance, headaches Skin: Negative; No rashes or skin lesions Psychiatric: Negative; No behavioral problems, depression Sleep: Positive for recently diagnosed severe obstructive sleep apnea with mild to moderate snoring and daytime sleepiness; no bruxism, restless legs, hypnogognic hallucinations. Other comprehensive 14 point system review is negative   Physical Exam BP 120/80 mmHg  Pulse 68  Ht '5\' 5"'$  (1.651 m)  Wt 187 lb (84.823 kg)  BMI 31.12 kg/m2 General: Alert, oriented, no distress.  Skin: normal turgor, no rashes, warm and dry HEENT: Normocephalic, atraumatic. Pupils equal round and reactive to light; sclera anicteric; extraocular muscles intact, No lid lag; Nose without nasal septal hypertrophy; Mouth/Parynx benign; Mallinpatti scale 3 Neck: No JVD, no carotid bruits; normal carotid upstroke Lungs: clear to ausculatation and percussion bilaterally; no wheezing or rales, normal inspiratory and expiratory effort Chest wall: without tenderness to palpitation Heart: PMI not  displaced, RRR, s1 s2 normal, 1/6 systolic murmur, No diastolic murmur, no rubs, gallops, thrills, or heaves Abdomen: soft, nontender; no hepatosplenomehaly, BS+; abdominal aorta nontender and not dilated by palpation. Back: no CVA tenderness Pulses: 2+  Musculoskeletal: full range of motion, normal strength, no joint deformities Extremities: Pulses 2+, no clubbing cyanosis or edema, Homan's sign negative  Neurologic: grossly nonfocal; Cranial nerves grossly wnl Psychologic: Normal mood and affect   ECG not done in the office today  LABS:  BMP Latest Ref Rng 06/11/2014  Glucose 70 - 99 mg/dL 118(H)  BUN 6 - 23 mg/dL 15  Creatinine 0.50 - 1.10 mg/dL 0.91  Sodium 135 - 145 mEq/L 138  Potassium 3.5 - 5.3 mEq/L 5.6(H)  Chloride 96 - 112 mEq/L 102  CO2 19 - 32 mEq/L 21  Calcium 8.4 - 10.5 mg/dL 9.8     No flowsheet data found.  CBC Latest Ref Rng 06/11/2014  WBC 4.0 -  10.5 K/uL 7.1  Hemoglobin 12.0 - 15.0 g/dL 13.2  Hematocrit 36.0 - 46.0 % 39.1  Platelets 150 - 400 K/uL 297   Lab Results  Component Value Date   MCV 85.2 06/11/2014    No results found for: TSH  BNP No results found for: BNP  ProBNP No results found for: PROBNP   Lipid Panel     Component Value Date/Time   CHOL 191 06/06/2014 1108   TRIG 123 06/06/2014 1108   HDL 44 06/06/2014 1108   CHOLHDL 4.3 06/06/2014 1108   VLDL 25 06/06/2014 1108   LDLCALC 122* 06/06/2014 1108     RADIOLOGY: No results found.    ASSESSMENT AND PLAN: Ms. Westbay is a 80 year old female who has significant cardiovascular comorbidities including paroxysmal atrial fibrillation, hypertension, as well as hyperlipidemia.  She is on Eliquis for anticoagulation.  She was referred for a diagnostic polysomnogram which was done several months after she had been seen by Dr. Debara Pickett due to a delay in being able to have this done at the sleep lab since the lab was unable to accommodate her study request sooner.  This initially had been  done, as only a diagnostic polysomnogram.  Again, due to significant difficulty with waiting time, her CPAP titration trial was ultimately able to be scheduled and done in late March.  She saw Dr. Debara Pickett in March to her CPAP titration. She continues to be significant asymptomatic with her obstructive sleep apnea and on the CPAP titration trial she derive significant benefit when titrated to a CPAP pressure of 13 cm.  I had an extensive discussion with both she and her daughter today.  I reviewed her diagnostic polysomnogram and CPAP trials in detail.  I also discussed her abnormal sleep architecture.  I discussed the risks associated with untreated sleep apnea particularly with reference to recurrent atrial fibrillation and her cardiovascular comorbidities.  I will try to expedite initiation of her home CPAP therapy.  She will need to be seen per Medicare guidelines within 90 days following CPAP initiation in a face-to-face assessment to document compliance.  Time spent: 30 minutes  Troy Sine, MD, [facility]  03/27/2015 1:25 PM

## 2015-03-27 NOTE — Patient Instructions (Signed)
Your physician recommends that you schedule a follow-up appointment in: 2 months in a sleep clinic after initiation of therapy.

## 2015-03-30 ENCOUNTER — Other Ambulatory Visit (HOSPITAL_COMMUNITY): Payer: Self-pay | Admitting: Nurse Practitioner

## 2015-03-30 DIAGNOSIS — R918 Other nonspecific abnormal finding of lung field: Secondary | ICD-10-CM

## 2015-04-01 ENCOUNTER — Encounter: Payer: Self-pay | Admitting: Internal Medicine

## 2015-04-06 ENCOUNTER — Ambulatory Visit (HOSPITAL_COMMUNITY): Payer: Medicare HMO

## 2015-04-07 ENCOUNTER — Other Ambulatory Visit: Payer: Medicare HMO

## 2015-04-07 ENCOUNTER — Ambulatory Visit: Payer: Medicare HMO

## 2015-04-07 ENCOUNTER — Telehealth: Payer: Self-pay | Admitting: *Deleted

## 2015-04-07 ENCOUNTER — Ambulatory Visit: Payer: Medicare HMO | Admitting: Internal Medicine

## 2015-04-07 NOTE — Telephone Encounter (Signed)
Called pt to check status due to no show MD visit 5/10. Daughter answered states" that appt was cancelled, my sister cancelled it. Mom was to have a PET scan first before she was allowed to see the Dr; so I thought my sister cancelled it." Daughter advised PET scan is on Friday 5/13. Declined to r/s appt at this time. Message forward to MD for review. POF to scheduling for pt appt to be r/s

## 2015-04-09 ENCOUNTER — Telehealth: Payer: Self-pay | Admitting: Internal Medicine

## 2015-04-09 NOTE — Telephone Encounter (Signed)
RN contacted [facility] No order received for C-PAP MACHINE,per Ivin Booty. Will forward to [place]

## 2015-04-09 NOTE — Telephone Encounter (Signed)
Pt have had 2 sleep studies,but she still does not have a C-Pap machine.

## 2015-04-10 ENCOUNTER — Ambulatory Visit (HOSPITAL_COMMUNITY)
Admission: RE | Admit: 2015-04-10 | Discharge: 2015-04-10 | Disposition: A | Discharge status: Home / Self Care | Payer: Medicare HMO | Source: Ambulatory Visit | Attending: Nurse Practitioner | Admitting: Nurse Practitioner

## 2015-04-10 DIAGNOSIS — C3490 Malignant neoplasm of unspecified part of unspecified bronchus or lung: Secondary | ICD-10-CM | POA: Insufficient documentation

## 2015-04-10 DIAGNOSIS — N200 Calculus of kidney: Secondary | ICD-10-CM | POA: Diagnosis not present

## 2015-04-10 DIAGNOSIS — I272 Other secondary pulmonary hypertension: Secondary | ICD-10-CM | POA: Insufficient documentation

## 2015-04-10 DIAGNOSIS — R918 Other nonspecific abnormal finding of lung field: Secondary | ICD-10-CM

## 2015-04-10 LAB — GLUCOSE, CAPILLARY: Glucose-Capillary: 96 mg/dL (ref 65–99)

## 2015-04-10 MED ORDER — FLUDEOXYGLUCOSE F - 18 (FDG) INJECTION: 9.9000 | Freq: Once | INTRAVENOUS | Status: AC | PRN

## 2015-04-13 ENCOUNTER — Ambulatory Visit (HOSPITAL_BASED_OUTPATIENT_CLINIC_OR_DEPARTMENT_OTHER): Payer: Medicare HMO | Admitting: Internal Medicine

## 2015-04-13 ENCOUNTER — Telehealth: Payer: Self-pay | Admitting: Internal Medicine

## 2015-04-13 ENCOUNTER — Other Ambulatory Visit (HOSPITAL_BASED_OUTPATIENT_CLINIC_OR_DEPARTMENT_OTHER): Payer: Medicare HMO

## 2015-04-13 ENCOUNTER — Encounter (INDEPENDENT_AMBULATORY_CARE_PROVIDER_SITE_OTHER): Payer: Self-pay

## 2015-04-13 ENCOUNTER — Encounter: Payer: Self-pay | Admitting: Internal Medicine

## 2015-04-13 ENCOUNTER — Other Ambulatory Visit: Payer: Self-pay | Admitting: Internal Medicine

## 2015-04-13 ENCOUNTER — Ambulatory Visit: Payer: Medicare HMO

## 2015-04-13 VITALS — BP 148/56 | HR 67 | Temp 97.9°F | Resp 17 | Ht 65.0 in | Wt 187.4 lb

## 2015-04-13 DIAGNOSIS — R918 Other nonspecific abnormal finding of lung field: Secondary | ICD-10-CM

## 2015-04-13 LAB — COMPREHENSIVE METABOLIC PANEL (CC13)
ALT: 12 U/L (ref 0–55)
AST: 17 U/L (ref 5–34)
Albumin: 3.5 g/dL (ref 3.5–5.0)
Alkaline Phosphatase: 46 U/L (ref 40–150)
Anion Gap: 10 mEq/L (ref 3–11)
BILIRUBIN TOTAL: 0.42 mg/dL (ref 0.20–1.20)
BUN: 19.6 mg/dL (ref 7.0–26.0)
CHLORIDE: 103 meq/L (ref 98–109)
CO2: 27 meq/L (ref 22–29)
Calcium: 9.3 mg/dL (ref 8.4–10.4)
Creatinine: 0.9 mg/dL (ref 0.6–1.1)
EGFR: 64 mL/min/{1.73_m2} — AB (ref >90)
GLUCOSE: 122 mg/dL (ref 70–140)
POTASSIUM: 4.8 meq/L (ref 3.5–5.1)
SODIUM: 139 meq/L (ref 136–145)
Total Protein: 7.1 g/dL (ref 6.4–8.3)

## 2015-04-13 LAB — CBC WITH DIFFERENTIAL/PLATELET
BASO%: 0.3 % (ref 0.0–2.0)
Basophils Absolute: 0 10*3/uL (ref 0.0–0.1)
EOS%: 0.8 % (ref 0.0–7.0)
Eosinophils Absolute: 0.1 10*3/uL (ref 0.0–0.5)
HEMATOCRIT: 36.8 % (ref 34.8–46.6)
HGB: 11.8 g/dL (ref 11.6–15.9)
LYMPH%: 20.5 % (ref 14.0–49.7)
MCH: 27.7 pg (ref 25.1–34.0)
MCHC: 32.1 g/dL (ref 31.5–36.0)
MCV: 86.4 fL (ref 79.5–101.0)
MONO#: 0.7 10*3/uL (ref 0.1–0.9)
MONO%: 7.7 % (ref 0.0–14.0)
NEUT#: 6.6 10*3/uL — ABNORMAL HIGH (ref 1.5–6.5)
NEUT%: 70.7 % (ref 38.4–76.8)
PLATELETS: 353 10*3/uL (ref 145–400)
RBC: 4.26 10*6/uL (ref 3.70–5.45)
RDW: 14.2 % (ref 11.2–14.5)
WBC: 9.4 10*3/uL (ref 3.9–10.3)
lymph#: 1.9 10*3/uL (ref 0.9–3.3)

## 2015-04-13 NOTE — Progress Notes (Signed)
Checked in new pt with no financial concerns prior to seeing the dr.  Abbott Pao has my card for any billing questions or concerns.

## 2015-04-13 NOTE — Telephone Encounter (Signed)
Gave and printed appt sched and avs for pt for June °

## 2015-04-13 NOTE — Progress Notes (Signed)
[facility] [phone]   [phone]  CONSULT NOTE  REFERRING PHYSICIAN: [name], NP  REASON FOR CONSULTATION:  80 years old white female with questionable lung cancer.  HPI Pamela Mcguire is a 80 y.o. female with past medical history significant for breast cancer status post right mastectomy more than 10 years ago in [place] followed by treatment with tamoxifen for 5 years, dyslipidemia, hypertension, diverticulosis, atrial fibrillation as well as depression and long history of smoking but quit 20 years ago. The patient was seen by her rheumatologist Dr. Amil Amen for evaluation of arthritis and during her evaluation she had chest x-ray performed which showed a nodule in the right lung. This was followed by CT scan of the chest with contrast on 03/25/2015 at [facility] and it showed evidence of a spiculated right upper lobe nodule measuring 1.8 x 0.9 cm. There was also enlarged right suprahilar and mediastinal lymph nodes with the largest node in the pretracheal area measuring 0.5 x 3.1 cm. There was also nonspecific small left adrenal mass that could represent adenoma or metastasis. The patient was seen by her primary care physician and a PET scan was performed on 04/10/2015 and it showed a spiculated right apical pulmonary nodule measuring 1.3 x 1.8 cm with SUV max of 10.3. There was also low right paratracheal node measuring 3.5 x 3.8 cm and a SUV max of 15.6. There was also right hilar hypermetabolism corresponding to soft tissue fullness/adenopathy with SUV max of 11.3. There was no hyper metabolic activity corresponding to the nodule in the left adrenal gland. The patient was referred to me today for evaluation and discussion of her condition and treatment options. When seen today she continues to complain of chest pain on the right side especially when she lays down. She also has shortness of breath that was getting worse recently and mild cough with  occasional hemoptysis in the past but not recently. The patient also has arthritis. She denied having any significant weight loss or night sweats. She has no nausea or vomiting. She denied having any headache or visual changes. Family history: The patient doesn't know much about her parents other than the mother was in a psychiatric facility. She is a widow and had 6 children, 4 living. She was accompanied today by 2 of her daughters [name] and [name]. She used to work as a Garment/textile technologist. She has a history of smoking more than one pack per day for around 40 years but quit 20 years ago. She also drinks 3 glasses of wine on the weekend. No history of drug abuse.  HPI  Past Medical History  Diagnosis Date  . Hyperlipidemia   . Diverticulitis   . CAD (coronary artery disease)   . Breast cancer     right mastectomy  . AF (atrial fibrillation)   . Hypertension   . Seasonal allergies   . Prediabetes     Past Surgical History  Procedure Laterality Date  . Cardioversion      No family history on file.  Social History History  Substance Use Topics  . Smoking status: Former Smoker -- 20 years    Quit date: 05/27/1994  . Smokeless tobacco: Never Used  . Alcohol Use: 0.0 oz/week    0 Standard drinks or equivalent per week     Comment: 1/2 gallon red wine/week - limited to special occasions (05/27/14)    No Known Allergies  Current Outpatient Prescriptions  Medication Sig Dispense Refill  .  albuterol (PROVENTIL HFA;VENTOLIN HFA) 108 (90 BASE) MCG/ACT inhaler Inhale 1 puff into the lungs every 6 (six) hours as needed for wheezing or shortness of breath.    . Cholecalciferol (VITAMIN D PO) Take by mouth every morning.    . Cyanocobalamin (VITAMIN B-12 PO) Take 1 capsule by mouth every morning.    Marland Kitchen ELIQUIS 5 MG TABS tablet TAKE 1 TABLET BY MOUTH TWICE DAILY 60 tablet 5  . escitalopram (LEXAPRO) 10 MG tablet Take 1 tablet (10 mg total) by mouth daily. 30 tablet 5  . ezetimibe-simvastatin  (VYTORIN) 10-40 MG per tablet Take 1 tablet by mouth daily. 90 tablet 3  . loratadine (CLARITIN) 10 MG tablet Take 10 mg by mouth at bedtime as needed for allergies.    . metoprolol tartrate (LOPRESSOR) 25 MG tablet Take 1 tablet (25 mg total) by mouth 2 (two) times daily. 180 tablet 2  . mometasone-formoterol (DULERA) 100-5 MCG/ACT AERO Inhale 2 puffs into the lungs 2 (two) times daily.    . Omega-3 Fatty Acids (FISH OIL) 1200 MG CAPS Take 2 capsules by mouth daily.    . pantoprazole (PROTONIX) 40 MG tablet Take 40 mg by mouth daily.    . Probiotic Product (PROBIOTIC PO) Take 1 capsule by mouth 2 (two) times daily.    . traZODone (DESYREL) 50 MG tablet Take 100 mg by mouth at bedtime.      No current facility-administered medications for this visit.    Review of Systems  Constitutional: negative Eyes: negative Ears, nose, mouth, throat, and face: negative Respiratory: positive for cough and dyspnea on exertion Cardiovascular: negative Gastrointestinal: negative Genitourinary:negative Integument/breast: negative Hematologic/lymphatic: negative Musculoskeletal:positive for arthralgias Neurological: negative Behavioral/Psych: negative Endocrine: negative Allergic/Immunologic: negative  Physical Exam  QQP:YPPJK, healthy, no distress, well nourished and well developed SKIN: skin color, texture, turgor are normal, no rashes or significant lesions HEAD: Normocephalic, No masses, lesions, tenderness or abnormalities EYES: normal, PERRLA, Conjunctiva are pink and non-injected EARS: External ears normal, Canals clear OROPHARYNX:no exudate, no erythema and lips, buccal mucosa, and tongue normal  NECK: supple, no adenopathy, no JVD LYMPH:  no palpable lymphadenopathy, no hepatosplenomegaly BREAST:not examined LUNGS: clear to auscultation , and palpation HEART: no murmurs, no gallops and irregularly irregular ABDOMEN:abdomen soft, non-tender, obese, normal bowel sounds and no masses or  organomegaly BACK: Back symmetric, no curvature., No CVA tenderness EXTREMITIES:no joint deformities, effusion, or inflammation, no edema, no skin discoloration  NEURO: alert & oriented x 3 with fluent speech, no focal motor/sensory deficits  PERFORMANCE STATUS: ECOG 1  LABORATORY DATA: Lab Results  Component Value Date   WBC 9.4 04/13/2015   HGB 11.8 04/13/2015   HCT 36.8 04/13/2015   MCV 86.4 04/13/2015   PLT 353 04/13/2015      Chemistry      Component Value Date/Time   NA 139 04/13/2015 1348   NA 138 06/11/2014 1608   K 4.8 04/13/2015 1348   K 5.6* 06/11/2014 1608   CL 102 06/11/2014 1608   CO2 27 04/13/2015 1348   CO2 21 06/11/2014 1608   BUN 19.6 04/13/2015 1348   BUN 15 06/11/2014 1608   CREATININE 0.9 04/13/2015 1348   CREATININE 0.91 06/11/2014 1608      Component Value Date/Time   CALCIUM 9.3 04/13/2015 1348   CALCIUM 9.8 06/11/2014 1608   ALKPHOS 46 04/13/2015 1348   AST 17 04/13/2015 1348   ALT 12 04/13/2015 1348   BILITOT 0.42 04/13/2015 1348       RADIOGRAPHIC  STUDIES: Nm Pet Image Initial (pi) Skull Base To Thigh  04/13/2015   CLINICAL DATA:  Initial treatment strategy for lung mass. History of breast cancer.  EXAM: NUCLEAR MEDICINE PET SKULL BASE TO THIGH  TECHNIQUE: 9.9 MCi F-18 FDG was injected intravenously. Full-ring PET imaging was performed from the skull base to thigh after the radiotracer. CT data was obtained and used for attenuation correction and anatomic localization.  FASTING BLOOD GLUCOSE:  Value: 96 mg/dl  COMPARISON:  None.  No outside images or report submitted.  FINDINGS: NECK  No areas of abnormal hypermetabolism.  CHEST  Spiculated right apical pulmonary nodule measures 1.3 x 1.8 cm and a S.U.V. max of 10.3 on image 18.  Low right paratracheal node measures 3.5 x 3.8 cm and a S.U.V. max of 15.6 on image 63.  Right hilar hypermetabolism corresponds to soft tissue fullness/ adenopathy. The node is difficult to differentiate from the  adjacent hilar vasculature, but measures a S.U.V. max of 11.3.  ABDOMEN/PELVIS  Hypermetabolism which corresponds to focal outpouching of the lateral aspect of the sigmoid colon. This measures a S.U.V. max of 10.0 on image 162.  SKELETON  No abnormal marrow activity.  CT IMAGES PERFORMED FOR ATTENUATION CORRECTION  Cerebral atrophy. No cervical adenopathy. Cardiomegaly. Aortic and branch vessel atherosclerosis. Multivessel coronary artery atherosclerosis. Pulmonary artery enlargement is mild, including a 2.7 cm right pulmonary artery.  4 mm left apical pulmonary nodule on image 17. More Posterior left apical 4 mm nodule on image 20. 4 mm right middle lobe lung nodule on image 54.  Right mastectomy and axillary node dissection. No axillary adenopathy. Punctate left renal collecting system calculus. Interpolar right renal cyst.  Cholecystectomy.  IMPRESSION: 1. 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 Presuming non-small-cell histology, T1aN2M0 or stage IIIA. 2. Hypermetabolism corresponding to a focal outpouching of the sigmoid colon. Suspicious for localized diverticulitis. 3. Incidental findings, including pulmonary arterial hypertension and left nephrolithiasis. 4. Nonspecific scattered pulmonary nodules. 5.  Atherosclerosis, including within the coronary arteries.   Electronically Signed   By: Abigail Miyamoto M.D.   On: 04/13/2015 08:09    ASSESSMENT: This is a very pleasant 80 years old white female was questionable as stage IIIA (T1a, N2, M0) lung cancer, presenting with right upper lobe nodule in addition to mediastinal lymphadenopathy, pending tissue diagnosis and the staging workup.   PLAN: I had a lengthy discussion with the patient and her daughters today about her current condition and further investigation to confirm her diagnosis as well as staging workup. I recommended for the patient to have MRI of the brain to rule out brain  metastasis. I will also refer the patient to cardiothoracic surgery for consideration of bronchoscopy, endobronchial ultrasound and biopsy plus/minus mediastinoscopy for tissue diagnosis. Depending on the final pathology and staging workup, I will discuss with the patient her treatment options which may include a course of concurrent chemoradiation if she has no evidence of metastatic disease to the brain. The patient will come back for follow-up visit in 2 weeks for reevaluation and more detailed discussion of her treatment options. She was also seen by the thoracic nurse navigator who will arrange for the patient an appointment with thoracic surgery for tissue diagnosis. The patient voices understanding of current disease status and treatment options and is in agreement with the current care plan.  All questions were answered. The patient knows to call the clinic with any problems, questions or concerns. We  can certainly see the patient much sooner if necessary.  Thank you so much for allowing me to participate in the care of [facility]. I will continue to follow up the patient with you and assist in her care.  I spent 40 minutes counseling the patient face to face. The total time spent in the appointment was 60 minutes.  Disclaimer: This note was dictated with voice recognition software. Similar sounding words can inadvertently be transcribed and may not be corrected upon review.   Brinnley Lacap K. Apr 13, 2015, 3:02 PM

## 2015-04-14 ENCOUNTER — Ambulatory Visit (HOSPITAL_COMMUNITY): Payer: Medicare HMO

## 2015-04-15 ENCOUNTER — Encounter: Payer: Self-pay | Admitting: Cardiovascular Disease

## 2015-04-15 ENCOUNTER — Telehealth: Payer: Self-pay | Admitting: Internal Medicine

## 2015-04-15 NOTE — Telephone Encounter (Signed)
returned call and confirmed with pt that radiology will call pt to sched MRI

## 2015-04-16 NOTE — Telephone Encounter (Signed)
Returned a call to patient's daughter informing her that [facility] is working on getting her CPAP and supplies covered by [employer]. Due to the time expiring from the time she saw Dr. Debara Pickett and the set up was requested Medicare will require for her to have another sleep study. We are trying to work out a way to keep her from having to do this. Dr. Claiborne Billings put in his last dictation the reasons that it took so long and that it was no fault of the patient. Once [facility] gets approval or denial we will contact them. I apologized for the length of time that this has taken and for any inconvenience to their family. Patient's daughter also informed me that the patient has recently been diagnosed with lung CA.

## 2015-04-17 ENCOUNTER — Telehealth: Payer: Self-pay | Admitting: *Deleted

## 2015-04-17 NOTE — Telephone Encounter (Signed)
Oncology Nurse Navigator Documentation  Oncology Nurse Navigator Flowsheets 04/17/2015  Navigator Encounter Type Clinic/MDC  Treatment Phase Other  Barriers/Navigation Needs No barriers at this time  Interventions Coordination of Care. Spoke with daughter regarding appt for [place] on 04/23/15.  Verbalized understanding of appt time and place.   Coordination of Care MD Appointments  Time Spent with Patient 15

## 2015-04-22 ENCOUNTER — Telehealth: Payer: Self-pay | Admitting: *Deleted

## 2015-04-22 NOTE — Telephone Encounter (Signed)
Called and spoke w/ pt's daughter w/ friendly reminder about clinic appt tomorrow.  Gave directions and instructions.

## 2015-04-23 ENCOUNTER — Institutional Professional Consult (permissible substitution) (INDEPENDENT_AMBULATORY_CARE_PROVIDER_SITE_OTHER): Payer: Medicare HMO | Admitting: Surgery

## 2015-04-23 ENCOUNTER — Encounter: Payer: Self-pay | Admitting: *Deleted

## 2015-04-23 ENCOUNTER — Ambulatory Visit: Payer: Medicare HMO | Attending: Surgery | Admitting: Physical Therapy

## 2015-04-23 VITALS — BP 128/43 | HR 73 | Temp 97.8°F | Resp 17 | Wt 191.0 lb

## 2015-04-23 DIAGNOSIS — R0602 Shortness of breath: Secondary | ICD-10-CM | POA: Insufficient documentation

## 2015-04-23 DIAGNOSIS — Z9181 History of falling: Secondary | ICD-10-CM | POA: Diagnosis not present

## 2015-04-23 DIAGNOSIS — R599 Enlarged lymph nodes, unspecified: Secondary | ICD-10-CM

## 2015-04-23 DIAGNOSIS — R2689 Other abnormalities of gait and mobility: Secondary | ICD-10-CM

## 2015-04-23 DIAGNOSIS — R29818 Other symptoms and signs involving the nervous system: Secondary | ICD-10-CM | POA: Diagnosis not present

## 2015-04-23 DIAGNOSIS — R590 Localized enlarged lymph nodes: Secondary | ICD-10-CM

## 2015-04-23 DIAGNOSIS — R918 Other nonspecific abnormal finding of lung field: Secondary | ICD-10-CM

## 2015-04-23 NOTE — Progress Notes (Unsigned)
Oncology Nurse Navigator Documentation  Oncology Nurse Navigator Flowsheets 04/23/2015  Navigator Encounter Type Clinic/MDC  Patient Visit Type Follow-up  Treatment Phase Other  Barriers/Navigation Needs Education. Information explained on biopsy  Interventions Education Method  Coordination of Care -  Education Method Verbal  Time Spent with Patient 30

## 2015-04-23 NOTE — Therapy (Signed)
[place], [place], [address] Phone: [phone]   Fax:  [phone]  Physical Therapy Evaluation  Patient Details  Name: Pamela Mcguire MRN: [id number] Date of Birth: February 24, 1935 Referring Provider:  [name], MD  Encounter Date: 04/23/2015      PT End of Session - 04/23/15 1709    Visit Number 1   Number of Visits 1   PT Start Time 7902   PT Stop Time 1615   PT Time Calculation (min) 20 min   Activity Tolerance Treatment limited secondary to medical complications (Comment)  shortness of breath   Behavior During Therapy [facility] for tasks assessed/performed      Past Medical History  Diagnosis Date  . Hyperlipidemia   . Diverticulitis   . CAD (coronary artery disease)   . Breast cancer     right mastectomy  . AF (atrial fibrillation)   . Hypertension   . Seasonal allergies   . Prediabetes   . Depression     Past Surgical History  Procedure Laterality Date  . Cardioversion      There were no vitals filed for this visit.  Visit Diagnosis:  Personal history of fall - Plan: PT plan of care cert/re-cert  Shortness of breath on exertion - Plan: PT plan of care cert/re-cert  Balance problem - Plan: PT plan of care cert/re-cert      Subjective Assessment - 04/23/15 1656    Subjective Pt. with shortness of breath with walking a short distance; also h/o falls.   Patient is accompained by: Family member  daughter   Pertinent History Incidental finding of lung nodule when being worked up for R.A. 03/25/15.  Has right hilar and mediastinal mass, 1.3 cm. x 1.8 cm.; no pathology yet.  Pt. to have bronch EBUS or mediastinoscopy for pathology before treatment is recommended.  h/o falls with a stay in a rehab facility and then home health PT because she "couldn't get up"; h/o right breast CA wiht mastectomy, CAD, atrial fib, HTN, depression, sleep apnea   Currently in Pain? Yes   Pain Score 6    Pain Location Hip   Pain Orientation Right;Left   Aggravating Factors  lying down   Pain Relieving Factors sitting up, use of ice            [facility] PT Assessment - 04/23/15 0001    Assessment   Medical Diagnosis probable lung cancer, but awaiting pathology   Onset Date/Surgical Date 03/25/15   Precautions   Precautions Fall;Other (comment)   Precaution Comments cancer precautions   Restrictions   Weight Bearing Restrictions No   Balance Screen   Has the patient fallen in the past 6 months No  but h/o earlier falls   Has the patient had a decrease in activity level because of a fear of falling?  Yes   Is the patient reluctant to leave their home because of a fear of falling?  Yes   [place] Private residence   Living Arrangements Children   Available Help at Discharge Family   Type of [place] to enter   [place] One level   Prior Function   Level of [place] device for independence;Needs assistance with ADLs;Needs assistance with homemaking   Leisure no regular exercise   Observation/Other Assessments   Observations older woman who looks pale   Posture/Postural Control  Posture/Postural Control Postural limitations   Postural Limitations Forward head   ROM / Strength   AROM / PROM / Strength AROM   AROM   Overall AROM  Deficits   Overall AROM Comments decreased trunk flexion and extension, partly due to loss of balance with these motions; rotation 25% loss bilat.   Ambulation/Gait   Ambulation/Gait Yes   Ambulation/Gait Assistance 6: Modified independent (Device/Increase time)   Ambulation Distance (Feet) --  short of breath with walking limits distance   Assistive device Straight cane;Rollator  rollator for longer distances   Balance   Balance Assessed Yes   Dynamic Standing Balance   Dynamic Standing - Comments reaches 8 inches forward in standing  and with significant difficulty; this is less than average                           PT Education - Apr 24, 2015 1708    Education provided Yes   Education Details posture, breathing, energy conservation, walking or other exercise, cough splinting, PT info   Person(s) Educated Patient;Child(ren)   Methods Explanation;Handout   Comprehension Verbalized understanding               Lung Clinic Goals - 04/24/2015 1713    Patient will be able to verbalize understanding of the benefit of exercise to decrease fatigue.   Status Achieved   Patient will be able to verbalize the importance of posture.   Status Achieved   Patient will be able to demonstrate diaphragmatic breathing for improved lung function.   Status Achieved   Patient will be able to verbalize understanding of the role of physical therapy to prevent functional decline and who to contact if physical therapy is needed.   Status Achieved             Plan - Apr 24, 2015 1709    Clinical Impression Statement Patient with significant h/o falls and impaired gait including limited endurance due to SOB now with lung nodule and probably cancer to be confirmed with procedure to get tissue diagnosis.   Pt will benefit from skilled therapeutic intervention in order to improve on the following deficits Cardiopulmonary status limiting activity;Decreased endurance;Difficulty walking;Decreased balance   Rehab Potential Fair   PT Frequency One time visit   PT Next Visit Plan None at this time; may benefit from therapy after diagnosis is  made and treatment begun for probable lung cancer.   PT Home Exercise Plan see education section   Consulted and Agree with Plan of Care Patient;Family member/caregiver          G-Codes - 2015-04-24 1713    Functional Assessment Tool Used clinical judgement   Functional Limitation Mobility: Walking and moving around   Mobility: Walking and Moving Around Current Status [phone]) At least  60 percent but less than 80 percent impaired, limited or restricted   Mobility: Walking and Moving Around Goal Status [phone]) At least 60 percent but less than 80 percent impaired, limited or restricted   Mobility: Walking and Moving Around Discharge Status [phone]) At least 60 percent but less than 80 percent impaired, limited or restricted       Problem List Patient Active Problem List   Diagnosis Date Noted  . Severe obstructive sleep apnea 03/27/2015  . Lung mass 03/27/2015  . Paroxysmal a-fib 05/27/2014  . DOE (dyspnea on exertion) 05/27/2014  . HTN (hypertension) 05/27/2014  . Dyslipidemia 05/27/2014  . Long term current use of antiarrhythmic drug  05/27/2014  . Bradycardia 05/27/2014  . Hair loss 05/27/2014    SALISBURY,DONNA 04/23/2015, 5:15 PM  [place], [place], [address] Phone: [phone]   Fax:  [phone]  Patient will follow up at outpatient cancer rehab if needed.  If the patient does require further physical therapy, a specific plan will be dictated and sent to the referring physician for approval at that time. The patient was educated today on the importance of posture, diaphragmatic breathing, energy conservation, and a progressive walking program.    Serafina Royals, PT 04/23/2015 5:16 PM

## 2015-04-24 ENCOUNTER — Encounter (HOSPITAL_COMMUNITY): Payer: Self-pay | Admitting: *Deleted

## 2015-04-24 ENCOUNTER — Encounter (HOSPITAL_COMMUNITY): Payer: Self-pay | Admitting: Pharmacy Technician

## 2015-04-24 ENCOUNTER — Encounter: Payer: Self-pay | Admitting: Surgery

## 2015-04-24 ENCOUNTER — Other Ambulatory Visit: Payer: Self-pay | Admitting: *Deleted

## 2015-04-24 DIAGNOSIS — R590 Localized enlarged lymph nodes: Secondary | ICD-10-CM | POA: Insufficient documentation

## 2015-04-24 NOTE — Progress Notes (Signed)
Cardiothoracic Surgery Consultation   PCP is Florina Ou, MD Referring Provider is Curt Bears, MD    HPI:  The patient is a 80 y.o. female with a history significant for breast cancer, status post right mastectomy more than 10 years ago in [place] followed by treatment with tamoxifen for 5 years, dyslipidemia, hypertension, diverticulosis, atrial fibrillation on Eliquis, as well as depression and long history of smoking (quit 20 years ago). The patient was seen by her rheumatologist Dr. Amil Amen for evaluation of arthritis and during her evaluation she mentioned that she had some blood streaking of her sputum. She had a chest x-ray performed which showed a nodule in the right lung. This was followed by CT scan of the chest with contrast on 03/25/2015 at [facility] and it showed evidence of a spiculated right upper lobe nodule measuring 1.8 x 0.9 cm. There was also enlarged right suprahilar and mediastinal lymph nodes with the largest node in the pretracheal area measuring 0.5 x 3.1 cm. There was also nonspecific small left adrenal mass that could represent adenoma or metastasis. PET scan was performed on 04/10/2015 and it showed a spiculated right apical pulmonary nodule measuring 1.3 x 1.8 cm with SUV max of 10.3. There was also low right paratracheal node measuring 3.5 x 3.8 cm and a SUV max of 15.6. There was also right hilar hypermetabolism corresponding to soft tissue fullness/adenopathy with SUV max of 11.3. There was no hyper metabolic activity corresponding to the nodule in the left adrenal gland.  Past Medical History  Diagnosis Date  . Hyperlipidemia   . Diverticulitis   . CAD (coronary artery disease)   . Breast cancer     right mastectomy  . AF (atrial fibrillation)   . Hypertension   . Seasonal allergies   . Prediabetes   . Depression     Past Surgical History  Procedure Laterality Date  . Cardioversion      Family history:   The patient doesn't know much  about her parents other than the mother was in a psychiatric facility. She is a widow and had 6 children, 4 living. She was accompanied today by 1 of her daughters.    Social History History  Substance Use Topics  . Smoking status: Former Smoker -- 20 years    Quit date: 05/27/1994  . Smokeless tobacco: Never Used  . Alcohol Use: 0.0 oz/week    0 Standard drinks or equivalent per week     Comment: 1/2 gallon red wine/week - limited to special occasions (05/27/14)  She used to work as a travel Music therapist.    Current Outpatient Prescriptions  Medication Sig Dispense Refill  . albuterol (PROVENTIL HFA;VENTOLIN HFA) 108 (90 BASE) MCG/ACT inhaler Inhale 1 puff into the lungs every 6 (six) hours as needed for wheezing or shortness of breath.    . Cholecalciferol (VITAMIN D PO) Take by mouth every morning.    . Cyanocobalamin (VITAMIN B-12 PO) Take 1 capsule by mouth every morning.    Marland Kitchen ELIQUIS 5 MG TABS tablet TAKE 1 TABLET BY MOUTH TWICE DAILY 60 tablet 5  . escitalopram (LEXAPRO) 10 MG tablet Take 1 tablet (10 mg total) by mouth daily. 30 tablet 5  . ezetimibe-simvastatin (VYTORIN) 10-40 MG per tablet Take 1 tablet by mouth daily. 90 tablet 3  . loratadine (CLARITIN) 10 MG tablet Take 10 mg by mouth at bedtime as needed for allergies.    . metoprolol tartrate (LOPRESSOR) 25 MG tablet Take 1 tablet (25  mg total) by mouth 2 (two) times daily. 180 tablet 2  . mometasone-formoterol (DULERA) 100-5 MCG/ACT AERO Inhale 2 puffs into the lungs 2 (two) times daily.    . Omega-3 Fatty Acids (FISH OIL) 1200 MG CAPS Take 2 capsules by mouth daily.    . pantoprazole (PROTONIX) 40 MG tablet Take 40 mg by mouth daily.    . Probiotic Product (PROBIOTIC PO) Take 1 capsule by mouth 2 (two) times daily.    . traZODone (DESYREL) 50 MG tablet Take 100 mg by mouth at bedtime.      No current facility-administered medications for this visit.    No Known Allergies  Review of Systems  Constitutional: Negative for  activity change, appetite change, fatigue and unexpected weight change.  HENT: Negative.   Eyes: Negative.   Respiratory: Positive for cough and shortness of breath.        Right chest pain  Blood streaked sputum  Cardiovascular: Negative for palpitations and leg swelling.       No exertional chest pain. She had a nuclear stress test in 2015 that was negative for ischemia.  Gastrointestinal: Negative for nausea, vomiting, abdominal pain, diarrhea, constipation and abdominal distention.  Endocrine: Negative.   Genitourinary: Negative.   Musculoskeletal: Positive for arthralgias.  Skin: Negative.   Allergic/Immunologic: Negative.   Neurological: Negative.   Hematological: Negative.   Psychiatric/Behavioral: Negative.     BP 128/43 mmHg  Pulse 73  Temp(Src) 97.8 F (36.6 C)  Resp 17  Wt 191 lb (86.637 kg)  SpO2 96% Physical Exam  Constitutional: She is oriented to person, place, and time. She appears well-developed and well-nourished. No distress.  HENT:  Head: Normocephalic and atraumatic.  Mouth/Throat: Oropharynx is clear and moist.  Eyes: EOM are normal. Pupils are equal, round, and reactive to light.  Neck: Normal range of motion. Neck supple. No JVD present. No tracheal deviation present. No thyromegaly present.  Cardiovascular: Normal rate, regular rhythm, normal heart sounds and intact distal pulses.   No murmur heard. Pulmonary/Chest: Effort normal and breath sounds normal. No respiratory distress. She has no wheezes. She has no rales. She exhibits no tenderness.  Abdominal: Soft. Bowel sounds are normal. She exhibits no distension and no mass. There is no tenderness.  Musculoskeletal: Normal range of motion. She exhibits no edema.  Lymphadenopathy:    She has no cervical adenopathy.  Neurological: She is alert and oriented to person, place, and time. She has normal strength. No cranial nerve deficit or sensory deficit.  Skin: Skin is warm and dry.  Psychiatric: She  has a normal mood and affect.     Diagnostic Tests:  CLINICAL DATA: Initial treatment strategy for lung mass. History of breast cancer.  EXAM: NUCLEAR MEDICINE PET SKULL BASE TO THIGH  TECHNIQUE: 9.9 MCi F-18 FDG was injected intravenously. Full-ring PET imaging was performed from the skull base to thigh after the radiotracer. CT data was obtained and used for attenuation correction and anatomic localization.  FASTING BLOOD GLUCOSE: Value: 96 mg/dl  COMPARISON: None. No outside images or report submitted.  FINDINGS: NECK  No areas of abnormal hypermetabolism.  CHEST  Spiculated right apical pulmonary nodule measures 1.3 x 1.8 cm and a S.U.V. max of 10.3 on image 18.  Low right paratracheal node measures 3.5 x 3.8 cm and a S.U.V. max of 15.6 on image 63.  Right hilar hypermetabolism corresponds to soft tissue fullness/ adenopathy. The node is difficult to differentiate from the adjacent hilar vasculature, but measures a  S.U.V. max of 11.3.  ABDOMEN/PELVIS  Hypermetabolism which corresponds to focal outpouching of the lateral aspect of the sigmoid colon. This measures a S.U.V. max of 10.0 on image 162.  SKELETON  No abnormal marrow activity.  CT IMAGES PERFORMED FOR ATTENUATION CORRECTION  Cerebral atrophy. No cervical adenopathy. Cardiomegaly. Aortic and branch vessel atherosclerosis. Multivessel coronary artery atherosclerosis. Pulmonary artery enlargement is mild, including a 2.7 cm right pulmonary artery.  4 mm left apical pulmonary nodule on image 17. More Posterior left apical 4 mm nodule on image 20. 4 mm right middle lobe lung nodule on image 54.  Right mastectomy and axillary node dissection. No axillary adenopathy. Punctate left renal collecting system calculus. Interpolar right renal cyst.  Cholecystectomy.  IMPRESSION: 1. Right apical primary bronchogenic carcinoma with nodal metastasis in the right-sided  mediastinum and right hilum. Presuming non-small-cell histology, T1aN2M0 or stage IIIA. 2. Hypermetabolism corresponding to a focal outpouching of the sigmoid colon. Suspicious for localized diverticulitis. 3. Incidental findings, including pulmonary arterial hypertension and left nephrolithiasis. 4. Nonspecific scattered pulmonary nodules. 5. Atherosclerosis, including within the coronary arteries.   Electronically Signed  By: Abigail Miyamoto M.D.  On: 04/13/2015 08:09      Cardiology Nuclear Med Study  Jessia Kief is a 80 y.o. female MRN : [id number] DOB: 09/12/1935  Procedure Date: 06/11/2014  Nuclear Med Background Indication for Stress Test: Evaluation for Ischemia and [facility] History: CAD;PAF -cardioversion;bradycardia;DVT;No prior respiratory history reported;No prior NUC MPI for comparison. Cardiac Risk Factors: Family History - CAD, History of Smoking, Hypertension, Lipids and Overweight  Symptoms: Chest Pain, Dizziness, DOE, Fatigue, Light-Headedness, SOB and weakness   Nuclear Pre-Procedure Caffeine/Decaff Intake: 1:00am NPO After: 11am   IV Site: L Forearm  IV 0.9% NS with Angio Cath: 22g  Chest Size (in): n/a IV Started by: Rolene Course, RN  Height: '5\' 6"'$  (1.676 m)  Cup Size: C- s/p right mastectomy/lymphectomy;No sticks right arm.  BMI: Body mass index is 27.29 kg/(m^2). Weight: 169 lb (76.658 kg)   Tech Comments: NO STICKS RIGHT ARM     Nuclear Med Study 1 or 2 day study: 1 day  Stress Test Type: Lexiscan  Order Authorizing Provider: Lyman Bishop, MD   Resting Radionuclide: Technetium 73mSestamibi  Resting Radionuclide Dose: 10.6 mCi   Stress Radionuclide: Thallous Chloride Tl201  Stress Radionuclide Dose: 29.8 mCi      Stress Protocol Rest HR: 50 Stress HR: 63  Rest BP: 185/88 Stress BP: 178/88  Exercise Time (min): n/a METS: n/a   Predicted Max HR: 143 bpm % Max HR: 44.06 bpm Rate  Pressure Product: 131517 Dose of Adenosine (mg): n/a Dose of Lexiscan: 0.4 mg  Dose of Atropine (mg): n/a Dose of Dobutamine: n/a mcg/kg/min (at max HR)  Stress Test Technologist: TLeane Para CCT Nuclear Technologist: PImagene Riches CNMT   Rest Procedure: Myocardial perfusion imaging was performed at rest 45 minutes following the intravenous administration of Technetium 918mestamibi. Stress Procedure: The patient received IV Lexiscan 0.4 mg over 15-seconds. Technetium 9964mstamibi injected IV at 30-seconds. Patient experienced SOB and 75 mg of Aminophylline IV was administered. There were no significant changes with Lexiscan. Quantitative spect images were obtained after a 45 minute delay.  Transient Ischemic Dilatation (Normal <1.22): 1.02  QGS EDV: 63 ml QGS ESV: 19 ml LV Ejection Fraction: 69%  Rest ECG: NSR - Normal EKG  Stress ECG: No significant change from baseline ECG  QPS Raw Data Images: There is interference from nuclear activity from structures below  the diaphragm. This does not affect the ability to read the study. Stress Images: Normal homogeneous uptake in all areas of the myocardium. Rest Images: Normal homogeneous uptake in all areas of the myocardium. Subtraction (SDS): No evidence of ischemia.  Impression Exercise Capacity: Lexiscan with no exercise. BP Response: Normal blood pressure response. Clinical Symptoms: There is dyspnea. ECG Impression: No significant ECG changes with Lexiscan. Comparison with Prior Nuclear Study: No previous nuclear study performed  Overall Impression: Normal stress nuclear study.  LV Wall Motion: NL LV Function; NL Wall Motion; EF 69%  Pixie Casino, MD, [facility] Board Certified in Nuclear Cardiology Attending Cardiologist [place], MD  06/11/2014 4:34 PM     Impression:  She has a right apical lung mass and hypermetabolic mediastinal and right hilar adenopathy  suspicious metastatic disease. I think mediastinoscopy is the best diagnostic test to get a tissue diagnosis and get enough tissue to allow genetic testing. EBUS would probably give [place] a diagnosis but limited tissue. Bronchoscopy or ENB of the right apical lung lesion would be low yield given the peripheral location of the lesion and small size. I discussed the procedure with the patient and her daughter including alternatives, benefits and risks including but not limited to bleeding, infection, injury to the vessels or airway in the mediastinum, pneumothorax and they understand and agree to proceed.  Plan:  Mediastinoscopy on Tuesday 04/28/2015. She will stop her Eliquis on Friday of this week.  [name], MD [facility] [phone]

## 2015-04-24 NOTE — Progress Notes (Signed)
In Dr. Vivi Martens note from today 04/24/15 he wrote that pt would stop Eliquis today. When I asked pt this on the phone she seemed unaware of this. I then instructed her not to take it again until Dr. Cyndia Bent tells her she can start it back. She voiced understanding.

## 2015-04-27 MED ORDER — CHLORHEXIDINE GLUCONATE CLOTH 2 % EX PADS: 6.0000 | MEDICATED_PAD | Freq: Once | CUTANEOUS | Status: DC

## 2015-04-27 MED ORDER — MUPIROCIN 2 % EX OINT: 1.0000 "application " | TOPICAL_OINTMENT | Freq: Once | CUTANEOUS | Status: DC

## 2015-04-27 MED ORDER — DEXTROSE 5 % IV SOLN
1.5000 g | INTRAVENOUS | Status: DC
  Filled 2015-04-27: qty 1.5

## 2015-04-28 ENCOUNTER — Encounter (HOSPITAL_COMMUNITY): Payer: Self-pay | Admitting: General Practice

## 2015-04-28 ENCOUNTER — Ambulatory Visit (HOSPITAL_COMMUNITY): Payer: Medicare HMO | Admitting: Certified Registered"

## 2015-04-28 ENCOUNTER — Encounter (HOSPITAL_COMMUNITY)
Admission: RE | Disposition: A | Discharge status: Home / Self Care | Payer: Self-pay | Source: Ambulatory Visit | Attending: Surgery

## 2015-04-28 ENCOUNTER — Ambulatory Visit (HOSPITAL_COMMUNITY): Payer: Medicare HMO

## 2015-04-28 ENCOUNTER — Observation Stay (HOSPITAL_COMMUNITY)
Admission: RE | Admit: 2015-04-28 | Discharge: 2015-04-29 | Disposition: A | Discharge status: Home / Self Care | Payer: Medicare HMO | Source: Ambulatory Visit | Attending: Surgery | Admitting: Surgery

## 2015-04-28 DIAGNOSIS — R590 Localized enlarged lymph nodes: Secondary | ICD-10-CM

## 2015-04-28 DIAGNOSIS — C3411 Malignant neoplasm of upper lobe, right bronchus or lung: Principal | ICD-10-CM | POA: Insufficient documentation

## 2015-04-28 DIAGNOSIS — C771 Secondary and unspecified malignant neoplasm of intrathoracic lymph nodes: Secondary | ICD-10-CM | POA: Insufficient documentation

## 2015-04-28 DIAGNOSIS — G4733 Obstructive sleep apnea (adult) (pediatric): Secondary | ICD-10-CM | POA: Insufficient documentation

## 2015-04-28 DIAGNOSIS — Z9011 Acquired absence of right breast and nipple: Secondary | ICD-10-CM | POA: Diagnosis not present

## 2015-04-28 DIAGNOSIS — K5790 Diverticulosis of intestine, part unspecified, without perforation or abscess without bleeding: Secondary | ICD-10-CM | POA: Diagnosis not present

## 2015-04-28 DIAGNOSIS — E785 Hyperlipidemia, unspecified: Secondary | ICD-10-CM | POA: Diagnosis not present

## 2015-04-28 DIAGNOSIS — Z853 Personal history of malignant neoplasm of breast: Secondary | ICD-10-CM | POA: Diagnosis not present

## 2015-04-28 DIAGNOSIS — F329 Major depressive disorder, single episode, unspecified: Secondary | ICD-10-CM | POA: Insufficient documentation

## 2015-04-28 DIAGNOSIS — I2510 Atherosclerotic heart disease of native coronary artery without angina pectoris: Secondary | ICD-10-CM | POA: Insufficient documentation

## 2015-04-28 DIAGNOSIS — I4891 Unspecified atrial fibrillation: Secondary | ICD-10-CM | POA: Insufficient documentation

## 2015-04-28 DIAGNOSIS — J939 Pneumothorax, unspecified: Secondary | ICD-10-CM

## 2015-04-28 DIAGNOSIS — Z23 Encounter for immunization: Secondary | ICD-10-CM | POA: Diagnosis not present

## 2015-04-28 DIAGNOSIS — I10 Essential (primary) hypertension: Secondary | ICD-10-CM | POA: Insufficient documentation

## 2015-04-28 DIAGNOSIS — N200 Calculus of kidney: Secondary | ICD-10-CM | POA: Diagnosis not present

## 2015-04-28 DIAGNOSIS — Z79899 Other long term (current) drug therapy: Secondary | ICD-10-CM | POA: Diagnosis not present

## 2015-04-28 DIAGNOSIS — Z87891 Personal history of nicotine dependence: Secondary | ICD-10-CM | POA: Insufficient documentation

## 2015-04-28 DIAGNOSIS — G4730 Sleep apnea, unspecified: Secondary | ICD-10-CM | POA: Diagnosis present

## 2015-04-28 HISTORY — DX: Reserved for inherently not codable concepts without codable children: IMO0001

## 2015-04-28 HISTORY — PX: MEDIASTINOSCOPY: SHX5086

## 2015-04-28 HISTORY — DX: Gastro-esophageal reflux disease without esophagitis: K21.9

## 2015-04-28 HISTORY — DX: Unspecified osteoarthritis, unspecified site: M19.90

## 2015-04-28 HISTORY — DX: Anxiety disorder, unspecified: F41.9

## 2015-04-28 HISTORY — DX: Frequency of micturition: R35.0

## 2015-04-28 HISTORY — DX: Sleep apnea, unspecified: G47.30

## 2015-04-28 LAB — PROTIME-INR
INR: 1 (ref 0.00–1.49)
Prothrombin Time: 13.4 seconds (ref 11.6–15.2)

## 2015-04-28 LAB — COMPREHENSIVE METABOLIC PANEL
ALT: 12 U/L — ABNORMAL LOW (ref 14–54)
AST: 16 U/L (ref 15–41)
Albumin: 3.5 g/dL (ref 3.5–5.0)
Alkaline Phosphatase: 42 U/L (ref 38–126)
Anion gap: 9 (ref 5–15)
BUN: 15 mg/dL (ref 6–20)
CALCIUM: 9.1 mg/dL (ref 8.9–10.3)
CO2: 27 mmol/L (ref 22–32)
CREATININE: 0.87 mg/dL (ref 0.44–1.00)
Chloride: 104 mmol/L (ref 101–111)
GLUCOSE: 125 mg/dL — AB (ref 65–99)
Potassium: 4.4 mmol/L (ref 3.5–5.1)
Sodium: 140 mmol/L (ref 135–145)
Total Bilirubin: 0.2 mg/dL — ABNORMAL LOW (ref 0.3–1.2)
Total Protein: 7 g/dL (ref 6.5–8.1)

## 2015-04-28 LAB — CBC
HCT: 37.4 % (ref 36.0–46.0)
Hemoglobin: 12 g/dL (ref 12.0–15.0)
MCH: 28 pg (ref 26.0–34.0)
MCHC: 32.1 g/dL (ref 30.0–36.0)
MCV: 87.2 fL (ref 78.0–100.0)
Platelets: 309 10*3/uL (ref 150–400)
RBC: 4.29 MIL/uL (ref 3.87–5.11)
RDW: 13.9 % (ref 11.5–15.5)
WBC: 7.9 10*3/uL (ref 4.0–10.5)

## 2015-04-28 LAB — APTT: aPTT: 34 seconds (ref 24–37)

## 2015-04-28 LAB — SURGICAL PCR SCREEN
MRSA, PCR: NEGATIVE
Staphylococcus aureus: POSITIVE — AB

## 2015-04-28 SURGERY — MEDIASTINOSCOPY
Anesthesia: General | Site: Chest

## 2015-04-28 MED ORDER — LIDOCAINE HCL 4 % MT SOLN
OROMUCOSAL | Status: DC | PRN
  Administered 2015-04-28: 4 mL via TOPICAL

## 2015-04-28 MED ORDER — 0.9 % SODIUM CHLORIDE (POUR BTL) OPTIME
TOPICAL | Status: DC | PRN
  Administered 2015-04-28: 1000 mL

## 2015-04-28 MED ORDER — OXYCODONE HCL 5 MG PO TABS: 5.0000 mg | ORAL_TABLET | ORAL | Status: DC | PRN

## 2015-04-28 MED ORDER — EPHEDRINE SULFATE 50 MG/ML IJ SOLN
INTRAMUSCULAR | Status: DC | PRN
  Administered 2015-04-28: 5 mg via INTRAVENOUS

## 2015-04-28 MED ORDER — PHENYLEPHRINE 40 MCG/ML (10ML) SYRINGE FOR IV PUSH (FOR BLOOD PRESSURE SUPPORT)
PREFILLED_SYRINGE | INTRAVENOUS | Status: AC
  Filled 2015-04-28: qty 10

## 2015-04-28 MED ORDER — GLYCOPYRROLATE 0.2 MG/ML IJ SOLN
INTRAMUSCULAR | Status: AC
  Filled 2015-04-28: qty 3

## 2015-04-28 MED ORDER — IPRATROPIUM-ALBUTEROL 0.5-2.5 (3) MG/3ML IN SOLN: 3.0000 mL | RESPIRATORY_TRACT | Status: DC | PRN

## 2015-04-28 MED ORDER — PROPOFOL 10 MG/ML IV BOLUS
INTRAVENOUS | Status: DC | PRN
  Administered 2015-04-28: 110 mg via INTRAVENOUS

## 2015-04-28 MED ORDER — MIDAZOLAM HCL 2 MG/2ML IJ SOLN
INTRAMUSCULAR | Status: AC
  Filled 2015-04-28: qty 2

## 2015-04-28 MED ORDER — ONDANSETRON HCL 4 MG/2ML IJ SOLN
INTRAMUSCULAR | Status: DC | PRN
  Administered 2015-04-28: 4 mg via INTRAVENOUS

## 2015-04-28 MED ORDER — IPRATROPIUM-ALBUTEROL 0.5-2.5 (3) MG/3ML IN SOLN
3.0000 mL | Freq: Two times a day (BID) | RESPIRATORY_TRACT | Status: DC
  Administered 2015-04-29: 3 mL via RESPIRATORY_TRACT
  Filled 2015-04-28: qty 3

## 2015-04-28 MED ORDER — SUCCINYLCHOLINE CHLORIDE 20 MG/ML IJ SOLN
INTRAMUSCULAR | Status: AC
  Filled 2015-04-28: qty 1

## 2015-04-28 MED ORDER — EZETIMIBE-SIMVASTATIN 10-40 MG PO TABS
1.0000 | ORAL_TABLET | Freq: Every day | ORAL | Status: DC
  Administered 2015-04-28: 1 via ORAL
  Filled 2015-04-28 (×2): qty 1

## 2015-04-28 MED ORDER — FENTANYL CITRATE (PF) 250 MCG/5ML IJ SOLN
INTRAMUSCULAR | Status: AC
  Filled 2015-04-28: qty 5

## 2015-04-28 MED ORDER — ROCURONIUM BROMIDE 50 MG/5ML IV SOLN
INTRAVENOUS | Status: AC
Start: 2015-04-28 — End: 2015-04-28
  Filled 2015-04-28: qty 1

## 2015-04-28 MED ORDER — ALBUTEROL SULFATE (2.5 MG/3ML) 0.083% IN NEBU
3.0000 mL | INHALATION_SOLUTION | Freq: Four times a day (QID) | RESPIRATORY_TRACT | Status: DC
  Administered 2015-04-28: 3 mL via RESPIRATORY_TRACT
  Filled 2015-04-28: qty 3

## 2015-04-28 MED ORDER — ARTIFICIAL TEARS OP OINT
TOPICAL_OINTMENT | OPHTHALMIC | Status: AC
  Filled 2015-04-28: qty 3.5

## 2015-04-28 MED ORDER — ACETAMINOPHEN 500 MG PO TABS
500.0000 mg | ORAL_TABLET | Freq: Four times a day (QID) | ORAL | Status: DC | PRN
  Administered 2015-04-29: 1000 mg via ORAL
  Filled 2015-04-28: qty 2

## 2015-04-28 MED ORDER — ROCURONIUM BROMIDE 100 MG/10ML IV SOLN
INTRAVENOUS | Status: DC | PRN
  Administered 2015-04-28: 50 mg via INTRAVENOUS

## 2015-04-28 MED ORDER — LIDOCAINE HCL (CARDIAC) 20 MG/ML IV SOLN
INTRAVENOUS | Status: DC | PRN
  Administered 2015-04-28: 40 mg via INTRAVENOUS

## 2015-04-28 MED ORDER — LIDOCAINE HCL (CARDIAC) 20 MG/ML IV SOLN
INTRAVENOUS | Status: AC
  Filled 2015-04-28: qty 5

## 2015-04-28 MED ORDER — EPHEDRINE SULFATE 50 MG/ML IJ SOLN
INTRAMUSCULAR | Status: AC
  Filled 2015-04-28: qty 1

## 2015-04-28 MED ORDER — ONDANSETRON HCL 4 MG/2ML IJ SOLN
INTRAMUSCULAR | Status: AC
  Filled 2015-04-28: qty 2

## 2015-04-28 MED ORDER — TRAZODONE HCL 100 MG PO TABS
100.0000 mg | ORAL_TABLET | Freq: Every day | ORAL | Status: DC
  Administered 2015-04-28: 100 mg via ORAL
  Filled 2015-04-28 (×3): qty 1

## 2015-04-28 MED ORDER — OXYCODONE HCL 5 MG PO TABS: 5.0000 mg | ORAL_TABLET | Freq: Once | ORAL | Status: DC

## 2015-04-28 MED ORDER — PHENYLEPHRINE HCL 10 MG/ML IJ SOLN
10.0000 mg | INTRAVENOUS | Status: DC | PRN
  Administered 2015-04-28: 20 ug/min via INTRAVENOUS

## 2015-04-28 MED ORDER — FENTANYL CITRATE (PF) 100 MCG/2ML IJ SOLN
INTRAMUSCULAR | Status: AC
  Filled 2015-04-28: qty 2

## 2015-04-28 MED ORDER — FENTANYL CITRATE (PF) 100 MCG/2ML IJ SOLN
25.0000 ug | INTRAMUSCULAR | Status: DC | PRN
  Administered 2015-04-28 (×2): 25 ug via INTRAVENOUS

## 2015-04-28 MED ORDER — METOPROLOL TARTRATE 25 MG PO TABS
25.0000 mg | ORAL_TABLET | Freq: Two times a day (BID) | ORAL | Status: DC
  Administered 2015-04-28 – 2015-04-29 (×2): 25 mg via ORAL
  Filled 2015-04-28 (×3): qty 1

## 2015-04-28 MED ORDER — PROPOFOL 10 MG/ML IV BOLUS
INTRAVENOUS | Status: AC
  Filled 2015-04-28: qty 20

## 2015-04-28 MED ORDER — LACTATED RINGERS IV SOLN
INTRAVENOUS | Status: DC
  Administered 2015-04-28: 08:00:00 via INTRAVENOUS

## 2015-04-28 MED ORDER — SODIUM CHLORIDE 0.9 % IJ SOLN
INTRAMUSCULAR | Status: AC
  Filled 2015-04-28: qty 10

## 2015-04-28 MED ORDER — OXYCODONE HCL 5 MG/5ML PO SOLN
ORAL | Status: AC
  Administered 2015-04-28: 5 mg
  Filled 2015-04-28: qty 5

## 2015-04-28 MED ORDER — ARTIFICIAL TEARS OP OINT
TOPICAL_OINTMENT | OPHTHALMIC | Status: DC | PRN
  Administered 2015-04-28: 1 via OPHTHALMIC

## 2015-04-28 MED ORDER — FENTANYL CITRATE (PF) 100 MCG/2ML IJ SOLN
INTRAMUSCULAR | Status: DC | PRN
  Administered 2015-04-28 (×3): 50 ug via INTRAVENOUS

## 2015-04-28 MED ORDER — HEMOSTATIC AGENTS (NO CHARGE) OPTIME
TOPICAL | Status: DC | PRN
  Administered 2015-04-28 (×2): 1 via TOPICAL

## 2015-04-28 MED ORDER — PNEUMOCOCCAL VAC POLYVALENT 25 MCG/0.5ML IJ INJ
0.5000 mL | INJECTION | INTRAMUSCULAR | Status: AC
  Administered 2015-04-29: 0.5 mL via INTRAMUSCULAR
  Filled 2015-04-28 (×2): qty 0.5

## 2015-04-28 MED ORDER — NEOSTIGMINE METHYLSULFATE 10 MG/10ML IV SOLN
INTRAVENOUS | Status: AC
  Filled 2015-04-28: qty 1

## 2015-04-28 MED ORDER — OXYCODONE HCL 5 MG PO TABS
5.0000 mg | ORAL_TABLET | ORAL | Status: DC | PRN
  Administered 2015-04-28 – 2015-04-29 (×5): 5 mg via ORAL
  Filled 2015-04-28 (×6): qty 1

## 2015-04-28 MED ORDER — MUPIROCIN 2 % EX OINT
TOPICAL_OINTMENT | CUTANEOUS | Status: AC
  Filled 2015-04-28: qty 22

## 2015-04-28 MED ORDER — DEXTROSE 5 % IV SOLN
1.5000 g | INTRAVENOUS | Status: DC | PRN
  Administered 2015-04-28: 1.5 g via INTRAVENOUS

## 2015-04-28 MED ORDER — GLYCOPYRROLATE 0.2 MG/ML IJ SOLN
INTRAMUSCULAR | Status: DC | PRN
  Administered 2015-04-28: .6 mg via INTRAVENOUS

## 2015-04-28 MED ORDER — NEOSTIGMINE METHYLSULFATE 10 MG/10ML IV SOLN
INTRAVENOUS | Status: DC | PRN
  Administered 2015-04-28: 4 mg via INTRAVENOUS

## 2015-04-28 MED ORDER — LORATADINE 10 MG PO TABS
10.0000 mg | ORAL_TABLET | Freq: Every day | ORAL | Status: DC
  Administered 2015-04-28 – 2015-04-29 (×2): 10 mg via ORAL
  Filled 2015-04-28 (×2): qty 1

## 2015-04-28 MED ORDER — MUPIROCIN 2 % EX OINT
TOPICAL_OINTMENT | Freq: Once | CUTANEOUS | Status: AC
  Administered 2015-04-28: 1 via NASAL

## 2015-04-28 SURGICAL SUPPLY — 43 items
BLADE SURG 15 STRL LF DISP TIS (BLADE) ×1 IMPLANT
BLADE SURG 15 STRL SS (BLADE) ×2
CANISTER SUCTION 2500CC (MISCELLANEOUS) ×3 IMPLANT
CLIP TI MEDIUM 6 (CLIP) ×3 IMPLANT
CONT SPEC 4OZ CLIKSEAL STRL BL (MISCELLANEOUS) ×6 IMPLANT
COVER SURGICAL LIGHT HANDLE (MISCELLANEOUS) ×3 IMPLANT
DERMABOND ADVANCED (GAUZE/BANDAGES/DRESSINGS) ×2
DERMABOND ADVANCED .7 DNX12 (GAUZE/BANDAGES/DRESSINGS) ×1 IMPLANT
DRAPE LAPAROTOMY T 102X78X121 (DRAPES) ×3 IMPLANT
ELECT CAUTERY BLADE 6.4 (BLADE) ×3 IMPLANT
ELECT REM PT RETURN 9FT ADLT (ELECTROSURGICAL) ×3
ELECTRODE REM PT RTRN 9FT ADLT (ELECTROSURGICAL) ×1 IMPLANT
GAUZE SPONGE 4X4 12PLY STRL (GAUZE/BANDAGES/DRESSINGS) ×3 IMPLANT
GLOVE EUDERMIC 7 POWDERFREE (GLOVE) ×3 IMPLANT
GLOVE SURG SS PI 7.0 STRL IVOR (GLOVE) ×6 IMPLANT
GOWN STRL REUS W/ TWL LRG LVL3 (GOWN DISPOSABLE) ×1 IMPLANT
GOWN STRL REUS W/ TWL XL LVL3 (GOWN DISPOSABLE) ×1 IMPLANT
GOWN STRL REUS W/TWL LRG LVL3 (GOWN DISPOSABLE) ×2
GOWN STRL REUS W/TWL XL LVL3 (GOWN DISPOSABLE) ×2
HEMOSTAT SURGICEL 2X14 (HEMOSTASIS) ×6 IMPLANT
KIT BASIN OR (CUSTOM PROCEDURE TRAY) ×3 IMPLANT
KIT ROOM TURNOVER OR (KITS) ×3 IMPLANT
NS IRRIG 1000ML POUR BTL (IV SOLUTION) ×3 IMPLANT
PACK SURGICAL SETUP 50X90 (CUSTOM PROCEDURE TRAY) ×3 IMPLANT
PAD ARMBOARD 7.5X6 YLW CONV (MISCELLANEOUS) ×6 IMPLANT
PENCIL BUTTON HOLSTER BLD 10FT (ELECTRODE) ×3 IMPLANT
SET ASEPTIC TRANSFER (MISCELLANEOUS) ×3 IMPLANT
SPONGE INTESTINAL PEANUT (DISPOSABLE) ×3 IMPLANT
SUT SILK 2 0 TIES 10X30 (SUTURE) IMPLANT
SUT SILK 3 0 (SUTURE) ×2
SUT SILK 3-0 18XBRD TIE 12 (SUTURE) ×1 IMPLANT
SUT VIC AB 2-0 CT1 27 (SUTURE) ×2
SUT VIC AB 2-0 CT1 TAPERPNT 27 (SUTURE) ×1 IMPLANT
SUT VIC AB 3-0 SH 27 (SUTURE)
SUT VIC AB 3-0 SH 27X BRD (SUTURE) IMPLANT
SUT VICRYL 4-0 PS2 18IN ABS (SUTURE) ×3 IMPLANT
SYR BULB IRRIGATION 50ML (SYRINGE) ×3 IMPLANT
SYRINGE 10CC LL (SYRINGE) ×3 IMPLANT
TOWEL OR 17X24 6PK STRL BLUE (TOWEL DISPOSABLE) ×6 IMPLANT
TOWEL OR 17X26 10 PK STRL BLUE (TOWEL DISPOSABLE) ×3 IMPLANT
TUBE CONNECTING 12'X1/4 (SUCTIONS) ×1
TUBE CONNECTING 12X1/4 (SUCTIONS) ×2 IMPLANT
WATER STERILE IRR 1000ML POUR (IV SOLUTION) IMPLANT

## 2015-04-28 NOTE — Anesthesia Procedure Notes (Signed)
Procedure Name: Intubation Performed by: Julian Reil Pre-anesthesia Checklist: Patient identified, Emergency Drugs available, Suction available, Patient being monitored and Timeout performed Patient Re-evaluated:Patient Re-evaluated prior to inductionOxygen Delivery Method: Circle system utilized Preoxygenation: Pre-oxygenation with 100% oxygen Intubation Type: IV induction Ventilation: Mask ventilation without difficulty Laryngoscope Size: Mac and 3 Grade View: Grade I Tube type: Oral Tube size: 7.5 mm Number of attempts: 1 Airway Equipment and Method: Stylet and LTA kit utilized Placement Confirmation: ETT inserted through vocal cords under direct vision,  positive ETCO2,  CO2 detector and breath sounds checked- equal and bilateral Secured at: 20 cm Tube secured with: Tape Dental Injury: Teeth and Oropharynx as per pre-operative assessment

## 2015-04-28 NOTE — Brief Op Note (Signed)
04/28/2015  10:34 AM  PATIENT:  [name]  80 y.o. female  PRE-OPERATIVE DIAGNOSIS:  Mediastinal lymphadenopathy and right upper lobe lung mass  POST-OPERATIVE DIAGNOSIS:  Same  PROCEDURE:  Procedure(s): MEDIASTINOSCOPY (N/A) with lymph node biopsies  SURGEON:  Surgeon(s) and Role:    * [name], MD - Primary  PHYSICIAN ASSISTANT: none  ASSISTANTS: none   ANESTHESIA:   general  EBL:  Total I/O In: 56 [IV Piggyback:50] Out: 100 [Blood:100]  BLOOD ADMINISTERED:none  DRAINS: none   LOCAL MEDICATIONS USED:  NONE  SPECIMEN:  Source of Specimen:  right paratracheal lymph node mass  DISPOSITION OF SPECIMEN:  PATHOLOGY   Frozen section: carcinoma, favor non-small cell.  COUNTS:  YES  TOURNIQUET:  * No tourniquets in log *  DICTATION: .Note written in [place]: Discharge to home after PACU  PATIENT DISPOSITION:  PACU - hemodynamically stable.   Delay start of Pharmacological VTE agent (>24hrs) due to surgical blood loss or risk of bleeding: not applicable

## 2015-04-28 NOTE — Discharge Instructions (Signed)
May resume normal activity  May shower

## 2015-04-28 NOTE — Anesthesia Postprocedure Evaluation (Signed)
  Anesthesia Post-op Note  Patient: Pamela Mcguire  Procedure(s) Performed: Procedure(s): MEDIASTINOSCOPY (N/A)  Patient Location: PACU  Anesthesia Type:General  Level of Consciousness: awake and alert   Airway and Oxygen Therapy: Patient Spontanous Breathing  Post-op Pain: mild  Post-op Assessment: Post-op Vital signs reviewed, Patient's Cardiovascular Status Stable and Respiratory Function Stable  Post-op Vital Signs: Reviewed  Filed Vitals:   04/28/15 1211  BP: 141/43  Pulse:   Temp:   Resp:     Complications: No apparent anesthesia complications

## 2015-04-28 NOTE — Anesthesia Preprocedure Evaluation (Signed)
Anesthesia Evaluation  Patient identified by MRN, date of birth, ID band Patient awake    Reviewed: Allergy & Precautions, H&P , NPO status , Patient's Chart, lab work & pertinent test results, reviewed documented beta blocker date and time   Airway Mallampati: II  TM Distance: >3 FB Neck ROM: Full    Dental no notable dental hx. (+) Lower Dentures, Upper Dentures, Dental Advisory Given   Pulmonary sleep apnea , former smoker,  breath sounds clear to auscultation  Pulmonary exam normal       Cardiovascular hypertension, Pt. on medications and Pt. on home beta blockers + CAD + dysrhythmias Atrial Fibrillation Rhythm:Regular Rate:Normal     Neuro/Psych Anxiety Depression negative neurological ROS     GI/Hepatic Neg liver ROS, GERD-  Medicated and Controlled,  Endo/Other  negative endocrine ROS  Renal/GU negative Renal ROS  negative genitourinary   Musculoskeletal  (+) Arthritis -, Osteoarthritis,    Abdominal   Peds  Hematology negative hematology ROS (+)   Anesthesia Other Findings   Reproductive/Obstetrics negative OB ROS                             Anesthesia Physical Anesthesia Plan  ASA: III  Anesthesia Plan: General   Post-op Pain Management:    Induction: Intravenous  Airway Management Planned: Oral ETT  Additional Equipment:   Intra-op Plan:   Post-operative Plan: Extubation in OR  Informed Consent: I have reviewed the patients History and Physical, chart, labs and discussed the procedure including the risks, benefits and alternatives for the proposed anesthesia with the patient or authorized representative who has indicated his/her understanding and acceptance.   Dental advisory given  Plan Discussed with: CRNA  Anesthesia Plan Comments:         Anesthesia Quick Evaluation

## 2015-04-28 NOTE — Interval H&P Note (Signed)
History and Physical Interval Note:  04/28/2015 8:55 AM  Pamela Mcguire  has presented today for surgery, with the diagnosis of Mediastinal lymphadenopathy  The various methods of treatment have been discussed with the patient and family. After consideration of risks, benefits and other options for treatment, the patient has consented to  Procedure(s): MEDIASTINOSCOPY (N/A) as a surgical intervention .  The patient's history has been reviewed, patient examined, no change in status, stable for surgery.  I have reviewed the patient's chart and labs.  Questions were answered to the patient's satisfaction.     [name]

## 2015-04-28 NOTE — Progress Notes (Signed)
Patient sats keep dropping when she goes to sleep. Patient still very sleepy. Talked to Dr. Ola Spurr and Dr. Cyndia Bent and she is okay to stay over night if needed for better monitoring and breathing.

## 2015-04-28 NOTE — H&P (Signed)
Pamela Mcguire [address]       ,[place] [address]             [phone]      Cardiothoracic Surgery History and Physical   PCP is Florina Ou, MD Referring Provider is Curt Bears, MD    HPI:  The patient is a 80 y.o. female with a history significant for breast cancer, status post right mastectomy more than 10 years ago in [place] followed by treatment with tamoxifen for 5 years, dyslipidemia, hypertension, diverticulosis, atrial fibrillation on Eliquis, as well as depression and long history of smoking (quit 20 years ago). The patient was seen by her rheumatologist Dr. Amil Amen for evaluation of arthritis and during her evaluation she mentioned that she had some blood streaking of her sputum. She had a chest x-ray performed which showed a nodule in the right lung. This was followed by CT scan of the chest with contrast on 03/25/2015 at [facility] and it showed evidence of a spiculated right upper lobe nodule measuring 1.8 x 0.9 cm. There was also enlarged right suprahilar and mediastinal lymph nodes with the largest node in the pretracheal area measuring 0.5 x 3.1 cm. There was also nonspecific small left adrenal mass that could represent adenoma or metastasis. PET scan was performed on 04/10/2015 and it showed a spiculated right apical pulmonary nodule measuring 1.3 x 1.8 cm with SUV max of 10.3. There was also low right paratracheal node measuring 3.5 x 3.8 cm and a SUV max of 15.6. There was also right hilar hypermetabolism corresponding to soft tissue fullness/adenopathy with SUV max of 11.3. There was no hyper metabolic activity corresponding to the nodule in the left adrenal gland.  Past Medical History  Diagnosis Date  . Hyperlipidemia   . Diverticulitis   . CAD (coronary artery disease)   . Breast cancer     right mastectomy  . AF (atrial fibrillation)   . Hypertension   . Seasonal allergies   . Prediabetes   .  Depression     Past Surgical History  Procedure Laterality Date  . Cardioversion      Family history:   The patient doesn't know much about her parents other than the mother was in a psychiatric facility. She is a widow and had 6 children, 4 living. She was accompanied today by 1 of her daughters.    Social History History  Substance Use Topics  . Smoking status: Former Smoker -- 20 years    Quit date: 05/27/1994  . Smokeless tobacco: Never Used  . Alcohol Use: 0.0 oz/week    0 Standard drinks or equivalent per week     Comment: 1/2 gallon red wine/week - limited to special occasions (05/27/14)  She used to work as a travel Music therapist.    Current Outpatient Prescriptions  Medication Sig Dispense Refill  . albuterol (PROVENTIL HFA;VENTOLIN HFA) 108 (90 BASE) MCG/ACT inhaler Inhale 1 puff into the lungs every 6 (six) hours as needed for wheezing or shortness of breath.    . Cholecalciferol (VITAMIN D PO) Take by mouth every morning.    . Cyanocobalamin (VITAMIN B-12 PO) Take 1 capsule by mouth every morning.    Marland Kitchen ELIQUIS 5 MG TABS tablet TAKE 1 TABLET BY MOUTH TWICE DAILY 60 tablet 5  . escitalopram (LEXAPRO) 10 MG tablet Take 1 tablet (10 mg total) by mouth daily. 30 tablet 5  . ezetimibe-simvastatin (VYTORIN) 10-40 MG per tablet Take 1 tablet by  mouth daily. 90 tablet 3  . loratadine (CLARITIN) 10 MG tablet Take 10 mg by mouth at bedtime as needed for allergies.    . metoprolol tartrate (LOPRESSOR) 25 MG tablet Take 1 tablet (25 mg total) by mouth 2 (two) times daily. 180 tablet 2  . mometasone-formoterol (DULERA) 100-5 MCG/ACT AERO Inhale 2 puffs into the lungs 2 (two) times daily.    . Omega-3 Fatty Acids (FISH OIL) 1200 MG CAPS Take 2 capsules by mouth daily.    . pantoprazole (PROTONIX) 40 MG tablet Take 40 mg by mouth daily.    . Probiotic Product (PROBIOTIC PO) Take 1 capsule by  mouth 2 (two) times daily.    . traZODone (DESYREL) 50 MG tablet Take 100 mg by mouth at bedtime.      No current facility-administered medications for this visit.    No Known Allergies  Review of Systems  Constitutional: Negative for activity change, appetite change, fatigue and unexpected weight change.  HENT: Negative.  Eyes: Negative.  Respiratory: Positive for cough and shortness of breath.   Right chest pain  Blood streaked sputum  Cardiovascular: Negative for palpitations and leg swelling.   No exertional chest pain. She had a nuclear stress test in 2015 that was negative for ischemia.  Gastrointestinal: Negative for nausea, vomiting, abdominal pain, diarrhea, constipation and abdominal distention.  Endocrine: Negative.  Genitourinary: Negative.  Musculoskeletal: Positive for arthralgias.  Skin: Negative.  Allergic/Immunologic: Negative.  Neurological: Negative.  Hematological: Negative.  Psychiatric/Behavioral: Negative.    BP 128/43 mmHg  Pulse 73  Temp(Src) 97.8 F (36.6 C)  Resp 17  Wt 191 lb (86.637 kg)  SpO2 96% Physical Exam  Constitutional: She is oriented to person, place, and time. She appears well-developed and well-nourished. No distress.  HENT:  Head: Normocephalic and atraumatic.  Mouth/Throat: Oropharynx is clear and moist.  Eyes: EOM are normal. Pupils are equal, round, and reactive to light.  Neck: Normal range of motion. Neck supple. No JVD present. No tracheal deviation present. No thyromegaly present.  Cardiovascular: Normal rate, regular rhythm, normal heart sounds and intact distal pulses.  No murmur heard. Pulmonary/Chest: Effort normal and breath sounds normal. No respiratory distress. She has no wheezes. She has no rales. She exhibits no tenderness.  Abdominal: Soft. Bowel sounds are normal. She exhibits no distension and no mass. There is no tenderness.  Musculoskeletal: Normal range of motion. She  exhibits no edema.  Lymphadenopathy:   She has no cervical adenopathy.  Neurological: She is alert and oriented to person, place, and time. She has normal strength. No cranial nerve deficit or sensory deficit.  Skin: Skin is warm and dry.  Psychiatric: She has a normal mood and affect.     Diagnostic Tests:  CLINICAL DATA: Initial treatment strategy for lung mass. History of breast cancer.  EXAM: NUCLEAR MEDICINE PET SKULL BASE TO THIGH  TECHNIQUE: 9.9 MCi F-18 FDG was injected intravenously. Full-ring PET imaging was performed from the skull base to thigh after the radiotracer. CT data was obtained and used for attenuation correction and anatomic localization.  FASTING BLOOD GLUCOSE: Value: 96 mg/dl  COMPARISON: None. No outside images or report submitted.  FINDINGS: NECK  No areas of abnormal hypermetabolism.  CHEST  Spiculated right apical pulmonary nodule measures 1.3 x 1.8 cm and a S.U.V. max of 10.3 on image 18.  Low right paratracheal node measures 3.5 x 3.8 cm and a S.U.V. max of 15.6 on image 63.  Right hilar hypermetabolism corresponds to soft  tissue fullness/ adenopathy. The node is difficult to differentiate from the adjacent hilar vasculature, but measures a S.U.V. max of 11.3.  ABDOMEN/PELVIS  Hypermetabolism which corresponds to focal outpouching of the lateral aspect of the sigmoid colon. This measures a S.U.V. max of 10.0 on image 162.  SKELETON  No abnormal marrow activity.  CT IMAGES PERFORMED FOR ATTENUATION CORRECTION  Cerebral atrophy. No cervical adenopathy. Cardiomegaly. Aortic and branch vessel atherosclerosis. Multivessel coronary artery atherosclerosis. Pulmonary artery enlargement is mild, including a 2.7 cm right pulmonary artery.  4 mm left apical pulmonary nodule on image 17. More Posterior left apical 4 mm nodule on image 20. 4 mm right middle lobe lung nodule on image 54.  Right mastectomy and  axillary node dissection. No axillary adenopathy. Punctate left renal collecting system calculus. Interpolar right renal cyst.  Cholecystectomy.  IMPRESSION: 1. 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 Presuming non-small-cell histology, T1aN2M0 or stage IIIA. 2. Hypermetabolism corresponding to a focal outpouching of the sigmoid colon. Suspicious for localized diverticulitis. 3. Incidental findings, including pulmonary arterial hypertension and left nephrolithiasis. 4. Nonspecific scattered pulmonary nodules. 5. Atherosclerosis, including within the coronary arteries.   Electronically Signed  By: Abigail Miyamoto M.D.  On: 04/13/2015 08:09      Cardiology Nuclear Med Study  Pamela Mcguire is a 80 y.o. female MRN : [id number] DOB: 11/14/1935  Procedure Date: 06/11/2014  Nuclear Med Background Indication for Stress Test: Evaluation for Ischemia and [facility] History: CAD;PAF -cardioversion;bradycardia;DVT;No prior respiratory history reported;No prior NUC MPI for comparison. Cardiac Risk Factors: Family History - CAD, History of Smoking, Hypertension, Lipids and Overweight  Symptoms: Chest Pain, Dizziness, DOE, Fatigue, Light-Headedness, SOB and weakness   Nuclear Pre-Procedure Caffeine/Decaff Intake: 1:00am NPO After: 11am   IV Site: L Forearm  IV 0.9% NS with Angio Cath: 22g  Chest Size (in): n/a IV Started by: Rolene Course, RN  Height: '5\' 6"'$  (1.676 m)  Cup Size: C- s/p right mastectomy/lymphectomy;No sticks right arm.  BMI: Body mass index is 27.29 kg/(m^2). Weight: 169 lb (76.658 kg)   Tech Comments: NO STICKS RIGHT ARM     Nuclear Med Study 1 or 2 day study: 1 day  Stress Test Type: Lexiscan  Order Authorizing Provider: Lyman Bishop, MD   Resting Radionuclide: Technetium 18mSestamibi  Resting Radionuclide Dose: 10.6 mCi     Stress Radionuclide: Thallous Chloride Tl201  Stress Radionuclide Dose: 29.8 mCi      Stress Protocol Rest HR: 50 Stress HR: 63  Rest BP: 185/88 Stress BP: 178/88  Exercise Time (min): n/a METS: n/a   Predicted Max HR: 143 bpm % Max HR: 44.06 bpm Rate Pressure Product: 185631 Dose of Adenosine (mg): n/a Dose of Lexiscan: 0.4 mg  Dose of Atropine (mg): n/a Dose of Dobutamine: n/a mcg/kg/min (at max HR)  Stress Test Technologist: TLeane Para CCT Nuclear Technologist: PImagene Riches CNMT   Rest Procedure: Myocardial perfusion imaging was performed at rest 45 minutes following the intravenous administration of Technetium 925mestamibi. Stress Procedure: The patient received IV Lexiscan 0.4 mg over 15-seconds. Technetium 9956mstamibi injected IV at 30-seconds. Patient experienced SOB and 75 mg of Aminophylline IV was administered. There were no significant changes with Lexiscan. Quantitative spect images were obtained after a 45 minute delay.  Transient Ischemic Dilatation (Normal <1.22): 1.02  QGS EDV: 63 ml QGS ESV: 19 ml LV Ejection Fraction: 69%  Rest ECG: NSR - Normal EKG  Stress ECG: No significant  change from baseline ECG  QPS Raw Data Images: There is interference from nuclear activity from structures below the diaphragm. This does not affect the ability to read the study. Stress Images: Normal homogeneous uptake in all areas of the myocardium. Rest Images: Normal homogeneous uptake in all areas of the myocardium. Subtraction (SDS): No evidence of ischemia.  Impression Exercise Capacity: Lexiscan with no exercise. BP Response: Normal blood pressure response. Clinical Symptoms: There is dyspnea. ECG Impression: No significant ECG changes with Lexiscan. Comparison with Prior Nuclear Study: No previous nuclear study performed  Overall Impression: Normal stress nuclear study.  LV Wall Motion: NL LV Function;  NL Wall Motion; EF 69%  Pixie Casino, MD, [facility] Board Certified in Nuclear Cardiology Attending Cardiologist [place], MD  06/11/2014 4:34 PM     Impression:  She has a right apical lung mass and hypermetabolic mediastinal and right hilar adenopathy suspicious metastatic disease. I think mediastinoscopy is the best diagnostic test to get a tissue diagnosis and get enough tissue to allow genetic testing. EBUS would probably give [place] a diagnosis but limited tissue. Bronchoscopy or ENB of the right apical lung lesion would be low yield given the peripheral location of the lesion and small size. I discussed the procedure with the patient and her daughter including alternatives, benefits and risks including but not limited to bleeding, infection, injury to the vessels or airway in the mediastinum, pneumothorax and they understand and agree to proceed.  Plan:  Mediastinoscopy on Tuesday 04/28/2015. She will stop her Eliquis on Friday of this week.  [name], MD [facility] [phone]

## 2015-04-28 NOTE — Transfer of Care (Signed)
Immediate Anesthesia Transfer of Care Note  Patient: Pamela Mcguire  Procedure(s) Performed: Procedure(s): MEDIASTINOSCOPY (N/A)  Patient Location: PACU  Anesthesia Type:General  Level of Consciousness: awake, alert , oriented and patient cooperative  Airway & Oxygen Therapy: Patient Spontanous Breathing and Patient connected to nasal cannula oxygen  Post-op Assessment: Report given to RN, Post -op Vital signs reviewed and stable and Patient moving all extremities  Post vital signs: Reviewed and stable  Last Vitals:  Filed Vitals:   04/28/15 0746  BP: 169/46  Pulse: 65  Temp: 36.6 C  Resp: 20    Complications: No apparent anesthesia complications

## 2015-04-29 ENCOUNTER — Ambulatory Visit (HOSPITAL_COMMUNITY)
Admission: RE | Admit: 2015-04-29 | Discharge: 2015-04-29 | Disposition: A | Discharge status: Home / Self Care | Payer: Medicare HMO | Source: Ambulatory Visit | Attending: Internal Medicine | Admitting: Internal Medicine

## 2015-04-29 ENCOUNTER — Other Ambulatory Visit: Payer: Self-pay | Admitting: Internal Medicine

## 2015-04-29 ENCOUNTER — Encounter (HOSPITAL_COMMUNITY): Payer: Self-pay | Admitting: Surgery

## 2015-04-29 DIAGNOSIS — C3411 Malignant neoplasm of upper lobe, right bronchus or lung: Secondary | ICD-10-CM | POA: Diagnosis not present

## 2015-04-29 DIAGNOSIS — R918 Other nonspecific abnormal finding of lung field: Secondary | ICD-10-CM

## 2015-04-29 MED ORDER — GADOBENATE DIMEGLUMINE 529 MG/ML IV SOLN
20.0000 mL | Freq: Once | INTRAVENOUS | Status: AC | PRN
  Administered 2015-04-29: 19 mL via INTRAVENOUS

## 2015-04-29 MED ORDER — OXYCODONE HCL 5 MG PO TABS: 5.0000 mg | ORAL_TABLET | ORAL | Status: DC | PRN

## 2015-04-29 NOTE — Discharge Summary (Signed)
Physician Discharge Summary       [address]       [place] [address]             [phone]    Patient ID: Pamela Mcguire MRN: [id number] DOB/AGE: Jan 29, 1935 80 y.o.  Admit date: 04/28/2015 Discharge date: 04/29/2015  Admission Diagnoses: 1. Mediastinal lymphadenopathy 2. Right upper lobe lung mass 3. History hyperlipidemia 4. History of CAD 5. History of right breast cancer 6. History of prediabetes 7. History of hypertension 8. History of a fib 9. History of tobacco abuse 10. Newly diagnosed OSA  Discharge Diagnoses:  1. Mediastinal lymphadenopathy 2. Right upper lobe lung mass 3. History hyperlipidemia 4. History of CAD 5. History of right breast cancer 6. History of prediabetes 7. History of hypertension 8. History of a fib 9. History of tobacco abuse 10. Newly diagnosed OSA   Procedure (s):  MEDIASTINOSCOPY (N/A) with lymph node biopsies by Dr. Cyndia Bent on 04/28/2015  History of Presenting Illness: The patient is a 80 y.o. female with a history significant for breast cancer, status post right mastectomy more than 10 years ago in [place] followed by treatment with tamoxifen for 5 years, dyslipidemia, hypertension, diverticulosis, atrial fibrillation on Eliquis, as well as depression and long history of smoking (quit 20 years ago). The patient was seen by her rheumatologist Dr. Amil Amen for evaluation of arthritis and during her evaluation she mentioned that she had some blood streaking of her sputum. She had a chest x-ray performed which showed a nodule in the right lung. This was followed by CT scan of the chest with contrast on 03/25/2015 at [facility] and it showed evidence of a spiculated right upper lobe nodule measuring 1.8 x 0.9 cm. There was also enlarged right suprahilar and mediastinal lymph nodes with the largest node in the pretracheal area measuring 0.5 x 3.1 cm. There was also nonspecific small left adrenal mass that could represent adenoma  or metastasis. PET scan was performed on 04/10/2015 and it showed a spiculated right apical pulmonary nodule measuring 1.3 x 1.8 cm with SUV max of 10.3. There was also low right paratracheal node measuring 3.5 x 3.8 cm and a SUV max of 15.6. There was also right hilar hypermetabolism corresponding to soft tissue fullness/adenopathy with SUV max of 11.3. There was no hyper metabolic activity corresponding to the nodule in the left adrenal gland. Dr. Cyndia Bent discussed the need for a mediastinoscopy. Potential risks, benefits, and complications were discussed with the patient and she agreed to proceed with surgery. She underwent a mediastinoscopy with lymph node biopsies on 04/28/2015.  Brief Hospital Course:  She did de sat post op and was very sleepy. As a result, she was kept over night for observation. She did require 1-2 liters of oxygen via [place]. Attempts were made to wean her to room air;however, she was 85 % on room. Home oxygen was arranged. She had pain under left axilla the evening of surgery. Cold compress did help. Her wound was clean and dry and derma bond is intact. She was recently diagnosed with OSA and her CPAP is supposed to be delivered today. She is tolerating a diet. She is felt surgically stable for discharge.   Latest Vital Signs: Blood pressure 161/53, pulse 73, temperature 98 F (36.7 C), temperature source Oral, resp. rate 18, weight 191 lb (86.637 kg), SpO2 96 %.  Physical Exam: Cardiovascular: RRR Pulmonary: Clear to auscultation bilaterally; no rales, wheezes, or rhonchi. Abdomen: Soft, non tender, bowel sounds  present. Wounds: Clean and dry. No erythema or signs of infection.  Discharge Condition:Stable to home  Recent laboratory studies:  Lab Results  Component Value Date   WBC 7.9 04/28/2015   HGB 12.0 04/28/2015   HCT 37.4 04/28/2015   MCV 87.2 04/28/2015   PLT 309 04/28/2015   Lab Results  Component Value Date   NA 140 04/28/2015   K 4.4 04/28/2015   CL  104 04/28/2015   CO2 27 04/28/2015   CREATININE 0.87 04/28/2015   GLUCOSE 125* 04/28/2015      Diagnostic Studies: Dg Chest 2 View  04/28/2015   CLINICAL DATA:  Preop  EXAM: CHEST  2 VIEW  COMPARISON:  None.  FINDINGS: The heart is mildly enlarged. Interstitial prominence is noted. Lungs are hyperaerated. No pneumothorax or pleural effusion  The lung parenchyma is abnormal. Throughout the right upper lobe, there is increased hazy opacity with increased reticular markings. There is also an ill-defined focal opacity in the central right upper lobe. Vague opacity at the medial left apex is noted. Patchy densities are present at the right lung base.  IMPRESSION: Cardiomegaly without edema.  Stigmata of COPD.  There are abnormal features in the lung parenchyma including ill-defined opacities in the upper lung zones. Focal masses cannot be excluded. There is also patchy opacity at the right base which may represent airspace disease and hazy density throughout the right upper lobe which is nonspecific. Correlation with prior imaging is recommended.   Electronically Signed   By: Marybelle Killings M.D.   On: 04/28/2015 08:01   Nm Pet Image Initial (pi) Skull Base To Thigh  04/13/2015   CLINICAL DATA:  Initial treatment strategy for lung mass. History of breast cancer.  EXAM: NUCLEAR MEDICINE PET SKULL BASE TO THIGH  TECHNIQUE: 9.9 MCi F-18 FDG was injected intravenously. Full-ring PET imaging was performed from the skull base to thigh after the radiotracer. CT data was obtained and used for attenuation correction and anatomic localization.  FASTING BLOOD GLUCOSE:  Value: 96 mg/dl  COMPARISON:  None.  No outside images or report submitted.  FINDINGS: NECK  No areas of abnormal hypermetabolism.  CHEST  Spiculated right apical pulmonary nodule measures 1.3 x 1.8 cm and a S.U.V. max of 10.3 on image 18.  Low right paratracheal node measures 3.5 x 3.8 cm and a S.U.V. max of 15.6 on image 63.  Right hilar hypermetabolism  corresponds to soft tissue fullness/ adenopathy. The node is difficult to differentiate from the adjacent hilar vasculature, but measures a S.U.V. max of 11.3.  ABDOMEN/PELVIS  Hypermetabolism which corresponds to focal outpouching of the lateral aspect of the sigmoid colon. This measures a S.U.V. max of 10.0 on image 162.  SKELETON  No abnormal marrow activity.  CT IMAGES PERFORMED FOR ATTENUATION CORRECTION  Cerebral atrophy. No cervical adenopathy. Cardiomegaly. Aortic and branch vessel atherosclerosis. Multivessel coronary artery atherosclerosis. Pulmonary artery enlargement is mild, including a 2.7 cm right pulmonary artery.  4 mm left apical pulmonary nodule on image 17. More Posterior left apical 4 mm nodule on image 20. 4 mm right middle lobe lung nodule on image 54.  Right mastectomy and axillary node dissection. No axillary adenopathy. Punctate left renal collecting system calculus. Interpolar right renal cyst.  Cholecystectomy.  IMPRESSION: 1. 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 Presuming non-small-cell histology, T1aN2M0 or stage IIIA. 2. Hypermetabolism corresponding to a focal outpouching of the sigmoid colon. Suspicious for localized diverticulitis. 3.  Incidental findings, including pulmonary arterial hypertension and left nephrolithiasis. 4. Nonspecific scattered pulmonary nodules. 5.  Atherosclerosis, including within the coronary arteries.   Electronically Signed   By: Abigail Miyamoto M.D.   On: 04/13/2015 08:09   Dg Chest Port 1 View  04/28/2015   CLINICAL DATA:  Post mediastinal surgery. Patient underwent mediastinoscopy with lymph node biopsies for mediastinal lymph node.  EXAM: PORTABLE CHEST - 1 VIEW  COMPARISON:  Chest radiograph 04/28/2015 and PET-CT 04/10/2015. Chest radiograph 03/18/2015.  FINDINGS: Heart size is within normal limits and stable. There is heavy atherosclerotic calcification of the thoracic aortic  arch. Lung volumes are low-normal. There are chronic bronchitic changes throughout both lungs, unchanged. Ill-defined approximately 18 mm mass is noted in the right upper lobe, recently evaluated with PET-CT. Thickening of the right paratracheal stripe corresponds to known mediastinal lymphadenopathy as described on recent PET-CT.  Negative for pneumothorax. No visible pneumomediastinum. No subcutaneous air. No visible pleural effusion. Right axillary surgical clips are present. Evidence of right mastectomy.  IMPRESSION: 1. No acute findings identified in the chest, in this patient status post mediastinoscopy. Chronic bronchitic changes are stable. 2. Right upper lobe mass, as described on recent PET-CT. 3. Evidence of mediastinal lymphadenopathy, as described on recent PET-CT. 4. Thoracic aorta atherosclerosis 5. Right mastectomy right axillary nodal dissection.   Electronically Signed   By: Curlene Dolphin M.D.   On: 04/28/2015 11:19    Discharge Medications:   Medication List    TAKE these medications        acetaminophen 500 MG tablet  Commonly known as:  TYLENOL  Take 500-1,000 mg by mouth every 6 (six) hours as needed for mild pain or headache.     albuterol 108 (90 BASE) MCG/ACT inhaler  Commonly known as:  PROVENTIL HFA;VENTOLIN HFA  Inhale 1 puff into the lungs every 6 (six) hours as needed for wheezing or shortness of breath.     ELIQUIS 5 MG Tabs tablet  Generic drug:  apixaban  TAKE 1 TABLET BY MOUTH TWICE DAILY     escitalopram 10 MG tablet  Commonly known as:  LEXAPRO  Take 1 tablet (10 mg total) by mouth daily.     ezetimibe-simvastatin 10-40 MG per tablet  Commonly known as:  VYTORIN  Take 1 tablet by mouth daily.     Fish Oil 1200 MG Caps  Take 2 capsules by mouth daily.     loratadine 10 MG tablet  Commonly known as:  CLARITIN  Take 10 mg by mouth at bedtime as needed for allergies.     metoprolol tartrate 25 MG tablet  Commonly known as:  LOPRESSOR  Take 1  tablet (25 mg total) by mouth 2 (two) times daily.     mometasone-formoterol 100-5 MCG/ACT Aero  Commonly known as:  DULERA  Inhale 2 puffs into the lungs 2 (two) times daily as needed for wheezing.     oxyCODONE 5 MG immediate release tablet  Commonly known as:  ROXICODONE  Take 1 tablet (5 mg total) by mouth every 4 (four) hours as needed for severe pain.     pantoprazole 40 MG tablet  Commonly known as:  PROTONIX  Take 40 mg by mouth daily.     PROBIOTIC PO  Take 1 capsule by mouth 2 (two) times daily.     traZODone 50 MG tablet  Commonly known as:  DESYREL  Take 100 mg by mouth at bedtime.     VITAMIN B-12 PO  Take 1  capsule by mouth every morning.     VITAMIN D PO  Take 1 tablet by mouth every morning.        Follow Up Appointments: Follow-up Information    Follow up with [name], MD On 05/13/2015.   Specialty:  Cardiothoracic Surgery   Why:  Appointment time is at 10:30 am   Contact information:   [place] [address] [phone]       Signed: Lars Pinks MPA-C 04/29/2015, 10:54 AM

## 2015-04-29 NOTE — Care Management Note (Addendum)
Case Management Note  Patient Details  Name: Pamela Mcguire MRN: [id number] Date of Birth: 1935/09/02  Subjective/Objective:        Non-small cell carcinoma metastases             Action/Plan: Lives with dtrs  Expected Discharge Date:      04/29/2015            Expected Discharge Plan:  Home/Self Care  In-House Referral:     Discharge planning Services  CM Consult  Post Acute Care Choice:    Choice offered to:     DME Arranged:  Oxygen DME Agency:  [place]:    [facility] Agency:     Status of Service:  Completed, signed off  Medicare Important Message Given:  No Date Medicare IM Given:    Medicare IM give by:    Date Additional Medicare IM Given:    Additional Medicare Important Message give by:     If discussed at [place] of Stay Meetings, dates discussed:    Additional Comments: NCM spoke to pt and gave permission to speak to dtr, [name] [phone]. She stays with her during the week, [address] [place] [address]. Contacted [facility] for oxygen for home. Pt has a cane at home.  Erenest Rasher, RN 04/29/2015, 2:16 PM

## 2015-04-29 NOTE — Progress Notes (Addendum)
      [address]       [place] [address]             [phone]       1 Day Post-Op Procedure(s) (LRB): MEDIASTINOSCOPY (N/A)  Subjective: Patient had a lot of pain below left axilla last night. Cool compress helped.  Objective: Vital signs in last 24 hours: Temp:  [97.2 F (36.2 C)-98 F (36.7 C)] 98 F (36.7 C) (06/01 0336) Pulse Rate:  [49-77] 73 (06/01 0336) Cardiac Rhythm:  [-] Normal sinus rhythm (05/31 1045) Resp:  [12-20] 18 (06/01 0336) BP: (113-169)/(43-79) 161/53 mmHg (06/01 0336) SpO2:  [87 %-100 %] 95 % (06/01 0336) Weight:  [191 lb (86.637 kg)] 191 lb (86.637 kg) (05/31 0742)     Intake/Output from previous day: 05/31 0701 - 06/01 0700 In: 665 [P.O.:490; I.V.:125; IV Piggyback:50] Out: 500 [Urine:400; Blood:100]   Physical Exam:  Cardiovascular: RRR Pulmonary: Clear to auscultation bilaterally; no rales, wheezes, or rhonchi. Abdomen: Soft, non tender, bowel sounds present. Wounds: Clean and dry.  No erythema or signs of infection.   Lab Results: CBC: Recent Labs  04/28/15 0710  WBC 7.9  HGB 12.0  HCT 37.4  PLT 309   BMET:  Recent Labs  04/28/15 0710  NA 140  K 4.4  CL 104  CO2 27  GLUCOSE 125*  BUN 15  CREATININE 0.87  CALCIUM 9.1    PT/INR:  Recent Labs  04/28/15 0710  LABPROT 13.4  INR 1.00   ABG:  INR: Will add last result for INR, ABG once components are confirmed Will add last 4 CBG results once components are confirmed  Assessment/Plan:  1. CV - SR in the 70's. 2.  Pulmonary - Newly diagnosed OSA. On 2 iters via [place]. Wean oxygen this am. Continue Duoneb.  3. Hope to discharge later today  ZIMMERMAN,DONIELLE MPA-C 04/29/2015,7:25 AM   Chart reviewed, patient examined, agree with above.

## 2015-04-29 NOTE — Plan of Care (Signed)
Problem: Problem: Respiratory Progression Goal: ABLE TO WEAN TO ROOM AIR Outcome: Not Progressing Unable to keep oxygen saturation above 85% on room air while sitting in bed.

## 2015-04-29 NOTE — Op Note (Addendum)
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 [name]   Assistant: none  Anesthesia: GET   Clinical History:   The patient is a 80 y.o. female with a history significant for breast cancer, status post right mastectomy more than 10 years ago in [place] followed by treatment with tamoxifen for 5 years, dyslipidemia, hypertension, diverticulosis, atrial fibrillation on Eliquis, as well as depression and long history of smoking (quit 20 years ago). The patient was seen by her rheumatologist Dr. Amil Amen for evaluation of arthritis and during her evaluation she mentioned that she had some blood streaking of her sputum. She had a chest x-ray performed which showed a nodule in the right lung. This was followed by CT scan of the chest with contrast on 03/25/2015 at [facility] and it showed evidence of a spiculated right upper lobe nodule measuring 1.8 x 0.9 cm. There was also enlarged right suprahilar and mediastinal lymph nodes with the largest node in the pretracheal area measuring 0.5 x 3.1 cm. There was also nonspecific small left adrenal mass that could represent adenoma or metastasis. PET scan was performed on 04/10/2015 and it showed a spiculated right apical pulmonary nodule measuring 1.3 x 1.8 cm with SUV max of 10.3. There was also low right paratracheal node measuring 3.5 x 3.8 cm and a SUV max of 15.6. There was also right hilar hypermetabolism corresponding to soft tissue fullness/adenopathy with SUV max of 11.3. There was no hyper metabolic activity corresponding to the nodule in the left adrenal gland.  She has a right apical lung mass and hypermetabolic mediastinal and right hilar adenopathy suspicious metastatic disease. I think mediastinoscopy  is the best diagnostic test to get a tissue diagnosis and get enough tissue to allow genetic testing. EBUS would probably give [place] a diagnosis but limited tissue. Bronchoscopy or ENB of the right apical lung lesion would be low yield given the peripheral location of the lesion and small size. I discussed the procedure with the patient and her daughter including alternatives, benefits and risks including but not limited to bleeding, infection, injury to the vessels or airway in the mediastinum, pneumothorax and they understand and agree to proceed.  Mediastinoscopy:  A roll was placed beneath the shoulders and the neck slightly extended. The neck and chest were prepped with betadine soap and solution and draped in the usual sterile manner. A  time out was taken and the correct patient and procedure were confirmed with nursing and anesthesia. A 2 cm incision was made horizontally just above the sternal notch in the natural skin crease. Electrocautery was used to continue down through the subcutaneous tissue. The strap muscles were separated in the midline to expose the trachea. A pretracheal plane was developed bluntly and the mediastinoscope inserted. It was advanced along the anterior tracheal wall. There was a large lymph node mass along the right paratracheal and pretracheal regions. Biopsies were taken and sent for frozen section. While I was waiting for these results I took several other biopsies of these lymph nodes which were firm and looked grossly like malignant lymph nodes. The initial frozen section results were called back to the OR by Dr. Tillman Sers and showed crush artifact and atypical cells but not diagnostic of malignancy. Therefore I sent the second set of biopsies for frozen section and  took a third set of biopsies which were sent for permanent pathology. The frozen section of the second set of biopsies showed metastatic carcinoma favoring non-small cell. Hemostasis was complete at the end of the  procedure. The scope was removed and the strap muscles re-approximated with interrupted 3-0 vicryl sutures. The platysma muscle was re-approximated with 3-0 vicryl suture and the skin with 4-0 vicryl subcuticular suture. Dermabond was applied. The sponge, needle, and instrument counts were correct according to the nurses. The patient was awakened, extubated and transported to the PACU in stable condition.

## 2015-04-29 NOTE — Progress Notes (Signed)
SATURATION QUALIFICATIONS: (This note is used to comply with regulatory documentation for home oxygen)  Patient Saturations on Room Air at Rest = 85%  Patient Saturations on Room Air while Ambulating = %  Patient Saturations on 2 Liters of oxygen while Ambulating = 93%  Please briefly explain why patient needs home oxygen: Patient unable to keep oxygen level at normal range.

## 2015-04-30 ENCOUNTER — Telehealth: Payer: Self-pay | Admitting: *Deleted

## 2015-04-30 ENCOUNTER — Telehealth: Payer: Self-pay | Admitting: Physician Assistant

## 2015-04-30 ENCOUNTER — Ambulatory Visit (HOSPITAL_BASED_OUTPATIENT_CLINIC_OR_DEPARTMENT_OTHER): Payer: Medicare HMO | Admitting: Physician Assistant

## 2015-04-30 ENCOUNTER — Encounter: Payer: Self-pay | Admitting: Physician Assistant

## 2015-04-30 VITALS — BP 106/31 | HR 74 | Temp 98.1°F | Resp 17 | Ht 65.0 in

## 2015-04-30 DIAGNOSIS — C3490 Malignant neoplasm of unspecified part of unspecified bronchus or lung: Secondary | ICD-10-CM

## 2015-04-30 DIAGNOSIS — C3491 Malignant neoplasm of unspecified part of right bronchus or lung: Secondary | ICD-10-CM | POA: Diagnosis not present

## 2015-04-30 DIAGNOSIS — C771 Secondary and unspecified malignant neoplasm of intrathoracic lymph nodes: Secondary | ICD-10-CM

## 2015-04-30 DIAGNOSIS — C3411 Malignant neoplasm of upper lobe, right bronchus or lung: Secondary | ICD-10-CM | POA: Insufficient documentation

## 2015-04-30 HISTORY — DX: Malignant neoplasm of unspecified part of unspecified bronchus or lung: C34.90

## 2015-04-30 LAB — TYPE AND SCREEN
ABO/RH(D): B NEG
Antibody Screen: POSITIVE
DAT, IgG: NEGATIVE
DONOR AG TYPE: NEGATIVE
Donor AG Type: NEGATIVE
PT AG TYPE: NEGATIVE
UNIT DIVISION: 0
Unit division: 0

## 2015-04-30 MED ORDER — PROCHLORPERAZINE MALEATE 10 MG PO TABS: 10.0000 mg | ORAL_TABLET | Freq: Four times a day (QID) | ORAL | Status: DC | PRN

## 2015-04-30 NOTE — Telephone Encounter (Signed)
per pof to sch pt appt-gave pt copy of sch-cld Santiago Glad to sch radiation-gave pt copy of sch-sent MW email to sch trmt

## 2015-04-30 NOTE — Telephone Encounter (Signed)
Per staff message and POF I have scheduled appts. Advised scheduler of appts. JMW  

## 2015-04-30 NOTE — Progress Notes (Signed)
No images are attached to the encounter. No scans are attached to the encounter. No scans are attached to the encounter. [facility] VISIT PROGRESS NOTE  [name], [place] [address]  DIAGNOSIS: Non-small cell carcinoma of lung, stage 3   Staging form: Lung, AJCC 7th Edition     Clinical stage from 04/30/2015: Stage IIIA (T1a, N2, M0) - Signed by Curt Bears, MD on 04/30/2015  PRIOR THERAPY: None  CURRENT THERAPY: Patient began a course of concurrent chemoradiation with chemotherapy in the form of weekly carboplatin for an AUC of 2 and paclitaxel at 45 mg/m given concurrent with radiation.  INTERVAL HISTORY: Pamela Mcguire 80 y.o. female returns for a scheduled regular office visit for followup of her recently diagnosed stage IIIa non-small cell lung cancer. She is accompanied by her 2 daughters. She recently had an MRI of the brain and presents to discuss the results as well as her treatment options. She had one episode of vomiting after her mediastinoscopy, likely related to the anesthesia. She is a little sore from her procedure. Reports decreased appetite. She is currently on oxygen at 2 L via nasal cannula.  MEDICAL HISTORY: Past Medical History  Diagnosis Date  . Hyperlipidemia   . Diverticulitis   . CAD (coronary artery disease)   . AF (atrial fibrillation)   . Hypertension   . Seasonal allergies   . Prediabetes   . Depression   . Shortness of breath dyspnea   . Anxiety   . Urinary frequency   . GERD (gastroesophageal reflux disease)   . Breast cancer     right mastectomy  . Sleep apnea      dx'd 10/2014, has not received cpap yet (04/28/2015)  . Arthritis     "thighs, hips, arms" (04/28/2015)  . Non-small cell carcinoma of lung, stage 3 04/30/2015    ALLERGIES:  has No Known Allergies.  MEDICATIONS:  Current Outpatient Prescriptions  Medication Sig Dispense Refill  . acetaminophen (TYLENOL)  500 MG tablet Take 500-1,000 mg by mouth every 6 (six) hours as needed for mild pain or headache.    . albuterol (PROVENTIL HFA;VENTOLIN HFA) 108 (90 BASE) MCG/ACT inhaler Inhale 1 puff into the lungs every 6 (six) hours as needed for wheezing or shortness of breath.    . Cholecalciferol (VITAMIN D PO) Take 1 tablet by mouth every morning.     . Cyanocobalamin (VITAMIN B-12 PO) Take 1 capsule by mouth every morning.    Marland Kitchen ELIQUIS 5 MG TABS tablet TAKE 1 TABLET BY MOUTH TWICE DAILY 60 tablet 5  . escitalopram (LEXAPRO) 10 MG tablet Take 1 tablet (10 mg total) by mouth daily. 30 tablet 5  . ezetimibe-simvastatin (VYTORIN) 10-40 MG per tablet Take 1 tablet by mouth daily. 90 tablet 3  . loratadine (CLARITIN) 10 MG tablet Take 10 mg by mouth at bedtime as needed for allergies.    . metoprolol tartrate (LOPRESSOR) 25 MG tablet Take 1 tablet (25 mg total) by mouth 2 (two) times daily. 180 tablet 2  . mometasone-formoterol (DULERA) 100-5 MCG/ACT AERO Inhale 2 puffs into the lungs 2 (two) times daily as needed for wheezing.     . Omega-3 Fatty Acids (FISH OIL) 1200 MG CAPS Take 2 capsules by mouth daily.    Marland Kitchen oxyCODONE (ROXICODONE) 5 MG immediate release tablet Take 1 tablet (5 mg total) by mouth every 4 (four) hours as needed for severe pain. 10 tablet  0  . pantoprazole (PROTONIX) 40 MG tablet Take 40 mg by mouth daily.    . Probiotic Product (PROBIOTIC PO) Take 1 capsule by mouth 2 (two) times daily.    . traZODone (DESYREL) 50 MG tablet Take 100 mg by mouth at bedtime.     . prochlorperazine (COMPAZINE) 10 MG tablet Take 1 tablet (10 mg total) by mouth every 6 (six) hours as needed for nausea or vomiting. 30 tablet 0   No current facility-administered medications for this visit.    SURGICAL HISTORY:  Past Surgical History  Procedure Laterality Date  . Cardioversion    . Tonsillectomy    . Colonoscopy    . Cesarean section  X 2  . Mastectomy Right   . Breast surgery    . Mediastinoscopy N/A  04/28/2015    Procedure: MEDIASTINOSCOPY;  Surgeon: [name], MD;  Location: [facility] OR;  Service: Thoracic;  Laterality: N/A;    REVIEW OF SYSTEMS:  Review of Systems  Constitutional: Negative for fever, chills, weight loss, malaise/fatigue and diaphoresis.       Decreased appetite  HENT: Negative for congestion, ear discharge, ear pain, hearing loss, nosebleeds, sore throat and tinnitus.   Eyes: Negative for blurred vision, double vision, photophobia, pain, discharge and redness.  Respiratory: Negative for cough, hemoptysis, sputum production, shortness of breath, wheezing and stridor.        Soreness from recent mediastinoscopy  Cardiovascular: Negative for chest pain, palpitations, orthopnea, claudication, leg swelling and PND.  Gastrointestinal: Positive for nausea and vomiting. Negative for heartburn, abdominal pain, diarrhea, constipation, blood in stool and melena.  Genitourinary: Negative.   Musculoskeletal: Negative.   Skin: Negative.   Neurological: Negative for dizziness, tingling, focal weakness, seizures, weakness and headaches.  Endo/Heme/Allergies: Does not bruise/bleed easily.  Psychiatric/Behavioral: Negative for depression. The patient is not nervous/anxious and does not have insomnia.      PHYSICAL EXAMINATION: Physical Exam  Constitutional: She is oriented to person, place, and time and well-developed, well-nourished, and in no distress.  Comfortably seated and wheelchair on 2 L of oxygen via nasal cannula  HENT:  Head: Normocephalic and atraumatic.  Mouth/Throat: Oropharynx is clear and moist.  Eyes: Pupils are equal, round, and reactive to light.  Neck: Normal range of motion. Neck supple. No JVD present. No tracheal deviation present. No thyromegaly present.  Cardiovascular: Normal rate, regular rhythm, normal heart sounds and intact distal pulses.  Exam reveals no gallop and no friction rub.   No murmur heard. Pulmonary/Chest: Effort normal and breath sounds  normal. No respiratory distress. She has no wheezes. She has no rales.  Abdominal: Soft. Bowel sounds are normal. She exhibits no distension and no mass. There is no tenderness.  Musculoskeletal: Normal range of motion. She exhibits no edema or tenderness.  Lymphadenopathy:    She has no cervical adenopathy.  Neurological: She is alert and oriented to person, place, and time. She has normal reflexes. Gait normal.  Skin: Skin is warm and dry. No rash noted.    ECOG PERFORMANCE STATUS: 2 - Symptomatic, <50% confined to bed  Blood pressure 106/31, pulse 74, temperature 98.1 F (36.7 C), temperature source Oral, resp. rate 17, height '5\' 5"'$  (1.651 m), SpO2 96 %.  LABORATORY DATA: Lab Results  Component Value Date   WBC 7.9 04/28/2015   HGB 12.0 04/28/2015   HCT 37.4 04/28/2015   MCV 87.2 04/28/2015   PLT 309 04/28/2015      Chemistry      Component  Value Date/Time   NA 140 04/28/2015 0710   NA 139 04/13/2015 1348   K 4.4 04/28/2015 0710   K 4.8 04/13/2015 1348   CL 104 04/28/2015 0710   CO2 27 04/28/2015 0710   CO2 27 04/13/2015 1348   BUN 15 04/28/2015 0710   BUN 19.6 04/13/2015 1348   CREATININE 0.87 04/28/2015 0710   CREATININE 0.9 04/13/2015 1348   CREATININE 0.91 06/11/2014 1608      Component Value Date/Time   CALCIUM 9.1 04/28/2015 0710   CALCIUM 9.3 04/13/2015 1348   ALKPHOS 42 04/28/2015 0710   ALKPHOS 46 04/13/2015 1348   AST 16 04/28/2015 0710   AST 17 04/13/2015 1348   ALT 12* 04/28/2015 0710   ALT 12 04/13/2015 1348   BILITOT 0.2* 04/28/2015 0710   BILITOT 0.42 04/13/2015 1348       RADIOGRAPHIC STUDIES:  Dg Chest 2 View  04/28/2015   CLINICAL DATA:  Preop  EXAM: CHEST  2 VIEW  COMPARISON:  None.  FINDINGS: The heart is mildly enlarged. Interstitial prominence is noted. Lungs are hyperaerated. No pneumothorax or pleural effusion  The lung parenchyma is abnormal. Throughout the right upper lobe, there is increased hazy opacity with increased reticular  markings. There is also an ill-defined focal opacity in the central right upper lobe. Vague opacity at the medial left apex is noted. Patchy densities are present at the right lung base.  IMPRESSION: Cardiomegaly without edema.  Stigmata of COPD.  There are abnormal features in the lung parenchyma including ill-defined opacities in the upper lung zones. Focal masses cannot be excluded. There is also patchy opacity at the right base which may represent airspace disease and hazy density throughout the right upper lobe which is nonspecific. Correlation with prior imaging is recommended.   Electronically Signed   By: Marybelle Killings M.D.   On: 04/28/2015 08:01   Mr Brain Wo Contrast  04/29/2015   CLINICAL DATA:  Lung cancer. Personal history breast cancer. Abnormal speech and gait.  EXAM: MRI HEAD WITHOUT CONTRAST  TECHNIQUE: Multiplanar, multiecho pulse sequences of the brain and surrounding structures were obtained without intravenous contrast.  COMPARISON:  None.  FINDINGS: Remote lacunar infarcts are present in the basal ganglia and cerebellum bilaterally. Moderate scratch the remote lacunar infarct is present in the right coronal radiata. Moderate atrophy and white matter disease is present bilaterally. The ventricles are proportionate to the degree of atrophy.  Flow is present in the major intracranial arteries. The globes and orbits are intact. The paranasal sinuses and mastoid air cells are clear.  IMPRESSION: 1. No focal mass lucent to suggest metastatic disease to the brain. 2. Multiple remote lacunar infarcts of the basal ganglia and cerebellum bilaterally. 3. Advanced atrophy and white matter disease likely reflects the sequela of chronic microvascular ischemia.   Electronically Signed   By: San Morelle M.D.   On: 04/29/2015 21:06   Nm Pet Image Initial (pi) Skull Base To Thigh  04/13/2015   CLINICAL DATA:  Initial treatment strategy for lung mass. History of breast cancer.  EXAM: NUCLEAR MEDICINE  PET SKULL BASE TO THIGH  TECHNIQUE: 9.9 MCi F-18 FDG was injected intravenously. Full-ring PET imaging was performed from the skull base to thigh after the radiotracer. CT data was obtained and used for attenuation correction and anatomic localization.  FASTING BLOOD GLUCOSE:  Value: 96 mg/dl  COMPARISON:  None.  No outside images or report submitted.  FINDINGS: NECK  No areas of abnormal hypermetabolism.  CHEST  Spiculated right apical pulmonary nodule measures 1.3 x 1.8 cm and a S.U.V. max of 10.3 on image 18.  Low right paratracheal node measures 3.5 x 3.8 cm and a S.U.V. max of 15.6 on image 63.  Right hilar hypermetabolism corresponds to soft tissue fullness/ adenopathy. The node is difficult to differentiate from the adjacent hilar vasculature, but measures a S.U.V. max of 11.3.  ABDOMEN/PELVIS  Hypermetabolism which corresponds to focal outpouching of the lateral aspect of the sigmoid colon. This measures a S.U.V. max of 10.0 on image 162.  SKELETON  No abnormal marrow activity.  CT IMAGES PERFORMED FOR ATTENUATION CORRECTION  Cerebral atrophy. No cervical adenopathy. Cardiomegaly. Aortic and branch vessel atherosclerosis. Multivessel coronary artery atherosclerosis. Pulmonary artery enlargement is mild, including a 2.7 cm right pulmonary artery.  4 mm left apical pulmonary nodule on image 17. More Posterior left apical 4 mm nodule on image 20. 4 mm right middle lobe lung nodule on image 54.  Right mastectomy and axillary node dissection. No axillary adenopathy. Punctate left renal collecting system calculus. Interpolar right renal cyst.  Cholecystectomy.  IMPRESSION: 1. 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 Presuming non-small-cell histology, T1aN2M0 or stage IIIA. 2. Hypermetabolism corresponding to a focal outpouching of the sigmoid colon. Suspicious for localized diverticulitis. 3. Incidental findings, including pulmonary arterial  hypertension and left nephrolithiasis. 4. Nonspecific scattered pulmonary nodules. 5.  Atherosclerosis, including within the coronary arteries.   Electronically Signed   By: Abigail Miyamoto M.D.   On: 04/13/2015 08:09   Dg Chest Port 1 View  04/28/2015   CLINICAL DATA:  Post mediastinal surgery. Patient underwent mediastinoscopy with lymph node biopsies for mediastinal lymph node.  EXAM: PORTABLE CHEST - 1 VIEW  COMPARISON:  Chest radiograph 04/28/2015 and PET-CT 04/10/2015. Chest radiograph 03/18/2015.  FINDINGS: Heart size is within normal limits and stable. There is heavy atherosclerotic calcification of the thoracic aortic arch. Lung volumes are low-normal. There are chronic bronchitic changes throughout both lungs, unchanged. Ill-defined approximately 18 mm mass is noted in the right upper lobe, recently evaluated with PET-CT. Thickening of the right paratracheal stripe corresponds to known mediastinal lymphadenopathy as described on recent PET-CT.  Negative for pneumothorax. No visible pneumomediastinum. No subcutaneous air. No visible pleural effusion. Right axillary surgical clips are present. Evidence of right mastectomy.  IMPRESSION: 1. No acute findings identified in the chest, in this patient status post mediastinoscopy. Chronic bronchitic changes are stable. 2. Right upper lobe mass, as described on recent PET-CT. 3. Evidence of mediastinal lymphadenopathy, as described on recent PET-CT. 4. Thoracic aorta atherosclerosis 5. Right mastectomy right axillary nodal dissection.   Electronically Signed   By: Curlene Dolphin M.D.   On: 04/28/2015 11:19     ASSESSMENT/PLAN:  No problem-specific assessment & plan notes found for this encounter.  the patient is a pleasant 80 year old Caucasian female recently diagnosed with stage IIIa non-small cell lung cancer. She is currently on oxygen at 2 L via nasal cannula. She still has some soreness from her recent mediastinal endoscopy. The MRI of her brain was  negative for brain metastasis and these results were discussed with the patient and her daughter. The patient was discussed with and also seen by Dr. Julien Nordmann. The plan is for her to proceed with a course of concurrent chemoradiation with chemotherapy in the form of carboplatin for an before meals of 2 and paclitaxel 45 mg/m given weekly concurrent with radiation. Her prescription for Compazine  was sent her pharmacy of record via E scribed for nausea or vomiting. We will plan for her to begin in approximately 2 weeks. She will be referred to radiation oncology so that they can do the simulation and schedule the radiation therapy hopefully to begin 05/11/2015. She will follow-up in 3 weeks for symptom management visit after starting her course of concurrent chemoradiation.  Awilda Metro E, PA-C 04/30/2015  All questions were answered. The patient knows to call the clinic with any problems, questions or concerns. We can certainly see the patient much sooner if necessary.  ADDENDUM: Hematology/Oncology Attending: I had a face to face encounter with the patient. I recommended her care plan. This is a very pleasant 80 years old white female recently diagnosed with a stage IIIa non-small cell lung cancer, adenocarcinoma. She recently had mediastinoscopy that showed evidence for metastatic adenocarcinoma to the mediastinal lymph nodes. The patient is here today for evaluation and discussion of her treatment options. I had a lengthy discussion with the patient today about her treatment options and I recommended for her course of concurrent chemoradiation with weekly carboplatin for AUC of 2 and paclitaxel 45 MG/M2 concurrent with radiation. I would refer the patient to radiation oncology for evaluation and discussion of the radiotherapy option. I discussed with the patient adverse effect of the chemotherapy including but not limited to alopecia, myelosuppression, nausea and vomiting, peripheral neuropathy,  liver or renal dysfunction. She is expected to start the first cycle of her concurrent chemoradiation on 05/11/2015. I will also arrange for the patient to have MRI of the brain to rule out brain metastases. She would come back for follow-up visit in 2 weeks for reevaluation and management of any adverse effect of her treatment. The patient was advised to call immediately if she has any concerning symptoms in the interval.  Disclaimer: This note was dictated with voice recognition software. Similar sounding words can inadvertently be transcribed and may be missed upon review. Eilleen Kempf., MD 05/04/2015

## 2015-05-01 ENCOUNTER — Encounter: Payer: Self-pay | Admitting: Radiation Oncology

## 2015-05-01 ENCOUNTER — Telehealth: Payer: Self-pay | Admitting: *Deleted

## 2015-05-01 NOTE — Progress Notes (Signed)
Thoracic Location of Tumor / Histology: non-small cell carcinoma of lung, stage 3  Patient presented to her rheumatologist for routine visit and mentioned she had hemoptysis. Chest xray preformed and showed evidence of a lung nodule. Chest xray was followed by CT and PET.  Biopsies of right paratracheal lymph node revealed:    Tobacco/Marijuana/Snuff/ETOH use: Former smoker quit 20 years ago  Past/Anticipated interventions by cardiothoracic surgery, if any: mediastinoscopy  Past/Anticipated interventions by medical oncology, if any: treatment with tamoxifen ten years ago in [place] for breast ca  Signs/Symptoms  Weight changes, if any:   Respiratory complaints, if any:   Hemoptysis, if any: yes  Pain issues, if any:    SAFETY ISSUES:  Prior radiation?   Pacemaker/ICD? no   Possible current pregnancy?no  Is the patient on methotrexate? no  Current Complaints / other details:  80 year old female.

## 2015-05-01 NOTE — Patient Instructions (Signed)
Keep the appointment with a radiation oncologist Start your concurrent chemoradiation as scheduled Follow-up in 3 weeks for symptom management visit

## 2015-05-01 NOTE — Telephone Encounter (Signed)
Oncology Nurse Navigator Documentation  Oncology Nurse Navigator Flowsheets 05/01/2015  Navigator Encounter Type Other/Follow up phone call   Patient Visit Type Phone  Treatment Phase Pre-treatment   Barriers/Navigation Needs Education  Interventions Called to follow up with patient.  She has a treatment plan for stage III lung cancer.  I called to see if she had any questions or concerns.  I left a vm message to call if needed.   Coordination of Care Other/Phone call   Education Method None  Time Spent with Patient 5

## 2015-05-04 ENCOUNTER — Ambulatory Visit
Admission: RE | Admit: 2015-05-04 | Discharge: 2015-05-04 | Disposition: A | Discharge status: Home / Self Care | Payer: Medicare HMO | Source: Ambulatory Visit | Attending: Radiation Oncology | Admitting: Radiation Oncology

## 2015-05-04 ENCOUNTER — Encounter: Payer: Self-pay | Admitting: Radiation Oncology

## 2015-05-04 VITALS — BP 130/48 | HR 61 | Resp 18 | Ht 65.0 in | Wt 191.0 lb

## 2015-05-04 DIAGNOSIS — I10 Essential (primary) hypertension: Secondary | ICD-10-CM | POA: Insufficient documentation

## 2015-05-04 DIAGNOSIS — M1990 Unspecified osteoarthritis, unspecified site: Secondary | ICD-10-CM | POA: Diagnosis not present

## 2015-05-04 DIAGNOSIS — Z510 Encounter for antineoplastic radiation therapy: Secondary | ICD-10-CM | POA: Diagnosis not present

## 2015-05-04 DIAGNOSIS — Z853 Personal history of malignant neoplasm of breast: Secondary | ICD-10-CM | POA: Diagnosis not present

## 2015-05-04 DIAGNOSIS — G4730 Sleep apnea, unspecified: Secondary | ICD-10-CM | POA: Diagnosis not present

## 2015-05-04 DIAGNOSIS — K219 Gastro-esophageal reflux disease without esophagitis: Secondary | ICD-10-CM | POA: Insufficient documentation

## 2015-05-04 DIAGNOSIS — I2510 Atherosclerotic heart disease of native coronary artery without angina pectoris: Secondary | ICD-10-CM | POA: Insufficient documentation

## 2015-05-04 DIAGNOSIS — E785 Hyperlipidemia, unspecified: Secondary | ICD-10-CM | POA: Diagnosis not present

## 2015-05-04 DIAGNOSIS — I4891 Unspecified atrial fibrillation: Secondary | ICD-10-CM | POA: Diagnosis not present

## 2015-05-04 DIAGNOSIS — F419 Anxiety disorder, unspecified: Secondary | ICD-10-CM | POA: Diagnosis not present

## 2015-05-04 DIAGNOSIS — Z87891 Personal history of nicotine dependence: Secondary | ICD-10-CM | POA: Diagnosis not present

## 2015-05-04 DIAGNOSIS — D381 Neoplasm of uncertain behavior of trachea, bronchus and lung: Secondary | ICD-10-CM

## 2015-05-04 DIAGNOSIS — C3411 Malignant neoplasm of upper lobe, right bronchus or lung: Secondary | ICD-10-CM | POA: Diagnosis not present

## 2015-05-04 DIAGNOSIS — C771 Secondary and unspecified malignant neoplasm of intrathoracic lymph nodes: Secondary | ICD-10-CM | POA: Insufficient documentation

## 2015-05-04 DIAGNOSIS — C3410 Malignant neoplasm of upper lobe, unspecified bronchus or lung: Secondary | ICD-10-CM | POA: Diagnosis present

## 2015-05-04 NOTE — Progress Notes (Signed)
Patient reports that she has pain in the center of her chest 5 on a scale of 0-10. Reports this pain has been present since her biopsy. Egg shaped bruised raised area noted base of neck. Reports ten pound weight gain noted over the last two months. Oxygen therapy has been prescribed because pulse ox drops to 85% with exertion however, patient forgot oxygen tank in her daughter's car. Reports a persistent productive cough with beige sputum. Reports hemoptysis has resolved. Reports nausea and vomiting following biopsy but, none since. Reports fatigue and weakness.

## 2015-05-04 NOTE — Progress Notes (Signed)
Radiation Oncology         [phone] ________________________________  Initial outpatient Consultation  Name: Pamela Mcguire  MRN: [id number]  Date: 05/04/2015  DOB: 1935/09/03  XA:JOIN, Neoma Laming, MD  Curt Bears, MD   REFERRING PHYSICIAN: Curt Bears, MD  DIAGNOSIS: The encounter diagnosis was Cancer of upper lobe of right lung.    ICD-9-CM ICD-10-CM   1. Cancer of upper lobe of right lung 162.3 C34.11     HISTORY OF PRESENT ILLNESS::Pamela Mcguire is a 80 y.o. female who had a chest x-ray performed by her rheumatologist. This showed a nodule in the right lung. The patient proceeded to chest CT on 03/25/15 at [facility]. This study confirmed a 1.8 cm right upper lung nodule with enlarged mediastinal lymph nodes. PET scan was performed on 04/10/15 confirming involved lymph nodes with SUV max of 11.3. A nodule in the left adrenal gland noted on chest CT showed no increase in metabolism on PET CT ruling out metastasis.            The patient was referred to thoracic surgery and underwent mediastinoscopy on 04/28/15. Sampled lymph nodes from the right paratracheal level revealed metastatic adenocarcinoma. The patient has been referred today to discuss possible radiation treatment options.      PREVIOUS RADIATION THERAPY: No  PAST MEDICAL HISTORY:  has a past medical history of Hyperlipidemia; Diverticulitis; CAD (coronary artery disease); AF (atrial fibrillation); Hypertension; Seasonal allergies; Prediabetes; Depression; Shortness of breath dyspnea; Anxiety; Urinary frequency; GERD (gastroesophageal reflux disease); Breast cancer; Sleep apnea; Arthritis; and Non-small cell carcinoma of lung, stage 3 (04/30/2015).    PAST SURGICAL HISTORY: Past Surgical History  Procedure Laterality Date  . Cardioversion    . Tonsillectomy    . Colonoscopy    . Cesarean section  X 2  . Mastectomy Right   . Breast surgery    . Mediastinoscopy N/A 04/28/2015    Procedure: MEDIASTINOSCOPY;   Surgeon: [name], MD;  Location: [place];  Service: Thoracic;  Laterality: N/A;    FAMILY HISTORY: family history is not on file.  SOCIAL HISTORY:  reports that she quit smoking about 22 years ago. Her smoking use included Cigarettes. She has a 35 pack-year smoking history. She has never used smokeless tobacco. She reports that she drinks about 5.4 oz of alcohol per week. She reports that she does not use illicit drugs.  ALLERGIES: Review of patient's allergies indicates no known allergies.  MEDICATIONS:  Current Outpatient Prescriptions  Medication Sig Dispense Refill  . acetaminophen (TYLENOL) 500 MG tablet Take 500-1,000 mg by mouth every 6 (six) hours as needed for mild pain or headache.    . albuterol (PROVENTIL HFA;VENTOLIN HFA) 108 (90 BASE) MCG/ACT inhaler Inhale 1 puff into the lungs every 6 (six) hours as needed for wheezing or shortness of breath.    . Cholecalciferol (VITAMIN D PO) Take 1 tablet by mouth every morning.     . Cyanocobalamin (VITAMIN B-12 PO) Take 1 capsule by mouth every morning.    Marland Kitchen ELIQUIS 5 MG TABS tablet TAKE 1 TABLET BY MOUTH TWICE DAILY 60 tablet 5  . escitalopram (LEXAPRO) 10 MG tablet Take 1 tablet (10 mg total) by mouth daily. 30 tablet 5  . ezetimibe-simvastatin (VYTORIN) 10-40 MG per tablet Take 1 tablet by mouth daily. 90 tablet 3  . loratadine (CLARITIN) 10 MG tablet Take 10 mg by mouth at bedtime as needed for allergies.    . metoprolol tartrate (LOPRESSOR) 25 MG tablet Take  1 tablet (25 mg total) by mouth 2 (two) times daily. 180 tablet 2  . mometasone-formoterol (DULERA) 100-5 MCG/ACT AERO Inhale 2 puffs into the lungs 2 (two) times daily as needed for wheezing.     . Omega-3 Fatty Acids (FISH OIL) 1200 MG CAPS Take 2 capsules by mouth daily.    Marland Kitchen oxyCODONE (ROXICODONE) 5 MG immediate release tablet Take 1 tablet (5 mg total) by mouth every 4 (four) hours as needed for severe pain. 10 tablet 0  . pantoprazole (PROTONIX) 40 MG tablet Take 40 mg  by mouth daily.    . Probiotic Product (PROBIOTIC PO) Take 1 capsule by mouth 2 (two) times daily.    . traZODone (DESYREL) 50 MG tablet Take 100 mg by mouth at bedtime.     . prochlorperazine (COMPAZINE) 10 MG tablet Take 1 tablet (10 mg total) by mouth every 6 (six) hours as needed for nausea or vomiting. (Patient not taking: Reported on 05/04/2015) 30 tablet 0   No current facility-administered medications for this encounter.    REVIEW OF SYSTEMS:  A 15 point review of systems is documented in the electronic medical record. This was obtained by the nursing staff. However,  I reviewed this with the patient to discuss relevant findings and make appropriate changes.  Pertinent items are noted in HPI.  Patient reports that she has pain in the center of her chest 5 on a scale of 0-10. Reports this pain has been present since her biopsy. Egg shaped bruised raised area noted base of neck. Reports ten pound weight gain noted over the last two months. Oxygen therapy has been prescribed because pulse ox drops to 85% with exertion however, patient forgot oxygen tank in her daughter's car. Reports a persistent productive cough with beige sputum. Reports hemoptysis has resolved. Reports nausea and vomit ing following biopsy but, none since. Reports fatigue and weakness.   PHYSICAL EXAM:  height is '5\' 5"'$  (1.651 m) and weight is 191 lb (86.637 kg). Her blood pressure is 130/48 and her pulse is 61. Her respiration is 18 and oxygen saturation is 94%.    Patient is in no acute distress or pain. EOMI Neck is notable recent mediastinotomy which is well intended with no evidence of infection. There is some ecchymosis noted. Respiratory effort is normal. Neurologic function is intact  Appropriate psychology.   KPS = 90  100 - Normal; no complaints; no evidence of disease. 90   - Able to carry on normal activity; minor signs or symptoms of disease. 80   - Normal activity with effort; some signs or symptoms of  disease. 60   - Cares for self; unable to carry on normal activity or to do active work. 60   - Requires occasional assistance, but is able to care for most of his personal needs. 50   - Requires considerable assistance and frequent medical care. 33   - Disabled; requires special care and assistance. 78   - Severely disabled; hospital admission is indicated although death not imminent. 5   - Very sick; hospital admission necessary; active supportive treatment necessary. 10   - Moribund; fatal processes progressing rapidly. 0     - Dead  Karnofsky DA, Abelmann WH, Craver LS and Burchenal [facility] [phone]) The use of the nitrogen mustards in the palliative treatment of carcinoma: with particular reference to bronchogenic carcinoma Cancer 1 634-56  LABORATORY DATA:  Lab Results  Component Value Date   WBC 7.9 04/28/2015   HGB 12.0  04/28/2015   HCT 37.4 04/28/2015   MCV 87.2 04/28/2015   PLT 309 04/28/2015   Lab Results  Component Value Date   NA 140 04/28/2015   K 4.4 04/28/2015   CL 104 04/28/2015   CO2 27 04/28/2015   Lab Results  Component Value Date   ALT 12* 04/28/2015   AST 16 04/28/2015   ALKPHOS 42 04/28/2015   BILITOT 0.2* 04/28/2015     RADIOGRAPHY: Dg Chest 2 View  04/28/2015   CLINICAL DATA:  Preop  EXAM: CHEST  2 VIEW  COMPARISON:  None.  FINDINGS: The heart is mildly enlarged. Interstitial prominence is noted. Lungs are hyperaerated. No pneumothorax or pleural effusion  The lung parenchyma is abnormal. Throughout the right upper lobe, there is increased hazy opacity with increased reticular markings. There is also an ill-defined focal opacity in the central right upper lobe. Vague opacity at the medial left apex is noted. Patchy densities are present at the right lung base.  IMPRESSION: Cardiomegaly without edema.  Stigmata of COPD.  There are abnormal features in the lung parenchyma including ill-defined opacities in the upper lung zones. Focal masses cannot be excluded.  There is also patchy opacity at the right base which may represent airspace disease and hazy density throughout the right upper lobe which is nonspecific. Correlation with prior imaging is recommended.   Electronically Signed   By: Marybelle Killings M.D.   On: 04/28/2015 08:01   Mr Brain Wo Contrast  04/29/2015   CLINICAL DATA:  Lung cancer. Personal history breast cancer. Abnormal speech and gait.  EXAM: MRI HEAD WITHOUT CONTRAST  TECHNIQUE: Multiplanar, multiecho pulse sequences of the brain and surrounding structures were obtained without intravenous contrast.  COMPARISON:  None.  FINDINGS: Remote lacunar infarcts are present in the basal ganglia and cerebellum bilaterally. Moderate scratch the remote lacunar infarct is present in the right coronal radiata. Moderate atrophy and white matter disease is present bilaterally. The ventricles are proportionate to the degree of atrophy.  Flow is present in the major intracranial arteries. The globes and orbits are intact. The paranasal sinuses and mastoid air cells are clear.  IMPRESSION: 1. No focal mass lucent to suggest metastatic disease to the brain. 2. Multiple remote lacunar infarcts of the basal ganglia and cerebellum bilaterally. 3. Advanced atrophy and white matter disease likely reflects the sequela of chronic microvascular ischemia.   Electronically Signed   By: San Morelle M.D.   On: 04/29/2015 21:06   Nm Pet Image Initial (pi) Skull Base To Thigh  04/13/2015   CLINICAL DATA:  Initial treatment strategy for lung mass. History of breast cancer.  EXAM: NUCLEAR MEDICINE PET SKULL BASE TO THIGH  TECHNIQUE: 9.9 MCi F-18 FDG was injected intravenously. Full-ring PET imaging was performed from the skull base to thigh after the radiotracer. CT data was obtained and used for attenuation correction and anatomic localization.  FASTING BLOOD GLUCOSE:  Value: 96 mg/dl  COMPARISON:  None.  No outside images or report submitted.  FINDINGS: NECK  No areas of  abnormal hypermetabolism.  CHEST  Spiculated right apical pulmonary nodule measures 1.3 x 1.8 cm and a S.U.V. max of 10.3 on image 18.  Low right paratracheal node measures 3.5 x 3.8 cm and a S.U.V. max of 15.6 on image 63.  Right hilar hypermetabolism corresponds to soft tissue fullness/ adenopathy. The node is difficult to differentiate from the adjacent hilar vasculature, but measures a S.U.V. max of 11.3.  ABDOMEN/PELVIS  Hypermetabolism which corresponds  to focal outpouching of the lateral aspect of the sigmoid colon. This measures a S.U.V. max of 10.0 on image 162.  SKELETON  No abnormal marrow activity.  CT IMAGES PERFORMED FOR ATTENUATION CORRECTION  Cerebral atrophy. No cervical adenopathy. Cardiomegaly. Aortic and branch vessel atherosclerosis. Multivessel coronary artery atherosclerosis. Pulmonary artery enlargement is mild, including a 2.7 cm right pulmonary artery.  4 mm left apical pulmonary nodule on image 17. More Posterior left apical 4 mm nodule on image 20. 4 mm right middle lobe lung nodule on image 54.  Right mastectomy and axillary node dissection. No axillary adenopathy. Punctate left renal collecting system calculus. Interpolar right renal cyst.  Cholecystectomy.  IMPRESSION: 1. 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 Presuming non-small-cell histology, T1aN2M0 or stage IIIA. 2. Hypermetabolism corresponding to a focal outpouching of the sigmoid colon. Suspicious for localized diverticulitis. 3. Incidental findings, including pulmonary arterial hypertension and left nephrolithiasis. 4. Nonspecific scattered pulmonary nodules. 5.  Atherosclerosis, including within the coronary arteries.   Electronically Signed   By: Abigail Miyamoto M.D.   On: 04/13/2015 08:09   Dg Chest Port 1 View  04/28/2015   CLINICAL DATA:  Post mediastinal surgery. Patient underwent mediastinoscopy with lymph node biopsies for mediastinal lymph  node.  EXAM: PORTABLE CHEST - 1 VIEW  COMPARISON:  Chest radiograph 04/28/2015 and PET-CT 04/10/2015. Chest radiograph 03/18/2015.  FINDINGS: Heart size is within normal limits and stable. There is heavy atherosclerotic calcification of the thoracic aortic arch. Lung volumes are low-normal. There are chronic bronchitic changes throughout both lungs, unchanged. Ill-defined approximately 18 mm mass is noted in the right upper lobe, recently evaluated with PET-CT. Thickening of the right paratracheal stripe corresponds to known mediastinal lymphadenopathy as described on recent PET-CT.  Negative for pneumothorax. No visible pneumomediastinum. No subcutaneous air. No visible pleural effusion. Right axillary surgical clips are present. Evidence of right mastectomy.  IMPRESSION: 1. No acute findings identified in the chest, in this patient status post mediastinoscopy. Chronic bronchitic changes are stable. 2. Right upper lobe mass, as described on recent PET-CT. 3. Evidence of mediastinal lymphadenopathy, as described on recent PET-CT. 4. Thoracic aorta atherosclerosis 5. Right mastectomy right axillary nodal dissection.   Electronically Signed   By: Curlene Dolphin M.D.   On: 04/28/2015 11:19      IMPRESSION: The patient is a very nice 80 year-old woman with stage IIIA adenocarcinoma of right upper lung. She may potentially benefit from concurrent radiation and chemotherapy.  PLAN:Today, I talked to the patient and family about the findings and work-up thus far.  We discussed the natural history of stage III lung cancer and general treatment, highlighting the role of radiotherapy in the management.  We discussed the available radiation techniques, and focused on the details of logistics and delivery.  We reviewed the anticipated acute and late sequelae associated with radiation in this setting.  The patient was encouraged to ask questions that I answered to the best of my ability.  I filled out a patient counseling  form during our discussion including treatment diagrams.  We retained a copy for our records.  The patient would like to proceed with radiation and will be scheduled for CT simulation.  CT simulation scheduled for Friday at 1 pm.    I spent 60 minutes minutes face to face with the patient and more than 50% of that time was spent in counseling and/or coordination of care.      ------------------------------------------------  Sheral Apley Tammi Klippel, M.D.      This document serves as a record of services personally performed by Tyler Pita, MD. It was created on his behalf by Derek Mound, a trained medical scribe. The creation of this record is based on the scribe's personal observations and the provider's statements to them. This document has been checked and approved by the attending provider.

## 2015-05-04 NOTE — Progress Notes (Signed)
See progress note under physician encounter. 

## 2015-05-08 ENCOUNTER — Ambulatory Visit
Admission: RE | Admit: 2015-05-08 | Discharge: 2015-05-08 | Disposition: A | Discharge status: Home / Self Care | Payer: Medicare HMO | Source: Ambulatory Visit | Attending: Radiation Oncology | Admitting: Radiation Oncology

## 2015-05-08 DIAGNOSIS — Z510 Encounter for antineoplastic radiation therapy: Secondary | ICD-10-CM | POA: Diagnosis not present

## 2015-05-08 DIAGNOSIS — C3411 Malignant neoplasm of upper lobe, right bronchus or lung: Secondary | ICD-10-CM

## 2015-05-08 NOTE — Progress Notes (Signed)
  Radiation Oncology         (336) [phone] ________________________________  Name: Pamela Mcguire  MRN: [id number]  Date: 05/08/2015  DOB: December 31, 1934  SIMULATION AND TREATMENT PLANNING NOTE    ICD-9-CM ICD-10-CM   1. Adenocarcinoma of upper lobe of right lung 162.3 C34.11     DIAGNOSIS:  80 yo woman with encounter diagnosis of Cancer of upper lobe of right lung.    ICD-9-CM ICD-10-CM   1. Cancer of upper lobe of right lung 162.3 C34.11     NARRATIVE:  The patient was brought to the [facility].  Identity was confirmed.  All relevant records and images related to the planned course of therapy were reviewed.  The patient freely provided informed written consent to proceed with treatment after reviewing the details related to the planned course of therapy. The consent form was witnessed and verified by the simulation staff.  Then, the patient was set-up in a stable reproducible  supine position for radiation therapy.  CT images were obtained.  Surface markings were placed.  The CT images were loaded into the planning software.  Then the target and avoidance structures were contoured.  Treatment planning then occurred.  The radiation prescription was entered and confirmed.  Then, I designed and supervised the construction of a total of 6 medically necessary complex treatment devices, including a BodyFix immobilization mold custom fitted to the patient along with 5 multileaf collimators conformally shaped radiation around the treatment target while shielding critical structures such as the heart and spinal cord maximally.  I have requested : 3D Simulation  I have requested a DVH of the following structures: Left lung, right lung, spinal cord, heart, esophagus, and target.  I have ordered:Nutrition Consult  SPECIAL TREATMENT PROCEDURE:  The planned course of therapy using radiation constitutes a special treatment procedure. Special care is required in the management of this patient for the  following reasons.  The patient will be receiving concurrent chemotherapy requiring careful monitoring for increased toxicities of treatment including periodic laboratory values.  The special nature of the planned course of radiotherapy will require increased physician supervision and oversight to ensure patient's safety with optimal treatment outcomes.  PLAN:  The patient will receive 66 Gy in 33 fractions.  This document serves as a record of services personally performed by Tyler Pita, MD. It was created on his behalf by Arlyce Harman, a trained medical scribe. The creation of this record is based on the scribe's personal observations and the provider's statements to them. This document has been checked and approved by the attending provider.    ________________________________  Sheral Apley. Tammi Klippel, M.D.

## 2015-05-11 ENCOUNTER — Other Ambulatory Visit: Payer: Self-pay | Admitting: Surgery

## 2015-05-11 ENCOUNTER — Other Ambulatory Visit: Payer: Self-pay | Admitting: Internal Medicine

## 2015-05-11 ENCOUNTER — Ambulatory Visit: Payer: Medicare HMO | Admitting: Internal Medicine

## 2015-05-11 ENCOUNTER — Other Ambulatory Visit: Payer: Medicare HMO

## 2015-05-11 ENCOUNTER — Encounter: Payer: Self-pay | Admitting: *Deleted

## 2015-05-11 ENCOUNTER — Ambulatory Visit: Payer: Medicare HMO

## 2015-05-11 ENCOUNTER — Other Ambulatory Visit (HOSPITAL_BASED_OUTPATIENT_CLINIC_OR_DEPARTMENT_OTHER): Payer: Medicare HMO

## 2015-05-11 ENCOUNTER — Ambulatory Visit (HOSPITAL_BASED_OUTPATIENT_CLINIC_OR_DEPARTMENT_OTHER): Payer: Medicare HMO

## 2015-05-11 ENCOUNTER — Telehealth: Payer: Self-pay | Admitting: Internal Medicine

## 2015-05-11 VITALS — BP 152/69 | HR 70 | Temp 97.9°F | Resp 20

## 2015-05-11 DIAGNOSIS — R918 Other nonspecific abnormal finding of lung field: Secondary | ICD-10-CM

## 2015-05-11 DIAGNOSIS — C3491 Malignant neoplasm of unspecified part of right bronchus or lung: Secondary | ICD-10-CM

## 2015-05-11 DIAGNOSIS — C3411 Malignant neoplasm of upper lobe, right bronchus or lung: Secondary | ICD-10-CM

## 2015-05-11 DIAGNOSIS — Z5111 Encounter for antineoplastic chemotherapy: Secondary | ICD-10-CM

## 2015-05-11 LAB — COMPREHENSIVE METABOLIC PANEL (CC13)
ALBUMIN: 3.2 g/dL — AB (ref 3.5–5.0)
ALT: 9 U/L (ref 0–55)
AST: 13 U/L (ref 5–34)
Alkaline Phosphatase: 41 U/L (ref 40–150)
Anion Gap: 9 mEq/L (ref 3–11)
BILIRUBIN TOTAL: 0.28 mg/dL (ref 0.20–1.20)
BUN: 14.9 mg/dL (ref 7.0–26.0)
CHLORIDE: 104 meq/L (ref 98–109)
CO2: 26 meq/L (ref 22–29)
CREATININE: 0.9 mg/dL (ref 0.6–1.1)
Calcium: 9.2 mg/dL (ref 8.4–10.4)
EGFR: 62 mL/min/{1.73_m2} — AB (ref >90)
GLUCOSE: 116 mg/dL (ref 70–140)
Potassium: 4.5 mEq/L (ref 3.5–5.1)
Sodium: 139 mEq/L (ref 136–145)
Total Protein: 6.8 g/dL (ref 6.4–8.3)

## 2015-05-11 LAB — CBC WITH DIFFERENTIAL/PLATELET
BASO%: 1.2 % (ref 0.0–2.0)
BASOS ABS: 0.1 10*3/uL (ref 0.0–0.1)
EOS ABS: 0.1 10*3/uL (ref 0.0–0.5)
EOS%: 1.4 % (ref 0.0–7.0)
HCT: 35 % (ref 34.8–46.6)
HEMOGLOBIN: 11.4 g/dL — AB (ref 11.6–15.9)
LYMPH%: 18.2 % (ref 14.0–49.7)
MCH: 28 pg (ref 25.1–34.0)
MCHC: 32.6 g/dL (ref 31.5–36.0)
MCV: 86.1 fL (ref 79.5–101.0)
MONO#: 0.6 10*3/uL (ref 0.1–0.9)
MONO%: 7.3 % (ref 0.0–14.0)
NEUT#: 6 10*3/uL (ref 1.5–6.5)
NEUT%: 71.9 % (ref 38.4–76.8)
Platelets: 336 10*3/uL (ref 145–400)
RBC: 4.06 10*6/uL (ref 3.70–5.45)
RDW: 14 % (ref 11.2–14.5)
WBC: 8.3 10*3/uL (ref 3.9–10.3)
lymph#: 1.5 10*3/uL (ref 0.9–3.3)

## 2015-05-11 MED ORDER — TRAMADOL HCL (ER BIPHASIC) 100 MG PO TB24: 100.0000 | ORAL_TABLET | Freq: Once | ORAL | Status: DC

## 2015-05-11 MED ORDER — DIPHENHYDRAMINE HCL 50 MG/ML IJ SOLN
50.0000 mg | Freq: Once | INTRAMUSCULAR | Status: AC
  Administered 2015-05-11: 50 mg via INTRAVENOUS

## 2015-05-11 MED ORDER — SODIUM CHLORIDE 0.9 % IV SOLN
Freq: Once | INTRAVENOUS | Status: AC
  Administered 2015-05-11: 11:00:00 via INTRAVENOUS

## 2015-05-11 MED ORDER — FAMOTIDINE IN NACL 20-0.9 MG/50ML-% IV SOLN
INTRAVENOUS | Status: AC
  Filled 2015-05-11: qty 50

## 2015-05-11 MED ORDER — DIPHENHYDRAMINE HCL 50 MG/ML IJ SOLN
INTRAMUSCULAR | Status: AC
Start: 2015-05-11 — End: 2015-05-11
  Filled 2015-05-11: qty 1

## 2015-05-11 MED ORDER — SODIUM CHLORIDE 0.9 % IV SOLN
Freq: Once | INTRAVENOUS | Status: AC
  Administered 2015-05-11: 12:00:00 via INTRAVENOUS
  Filled 2015-05-11: qty 8

## 2015-05-11 MED ORDER — TRAZODONE HCL 100 MG PO TABS: 100.0000 mg | ORAL_TABLET | Freq: Every day | ORAL | Status: DC

## 2015-05-11 MED ORDER — CARBOPLATIN CHEMO INJECTION 450 MG/45ML
176.8000 mg | Freq: Once | INTRAVENOUS | Status: AC
  Administered 2015-05-11: 180 mg via INTRAVENOUS
  Filled 2015-05-11: qty 18

## 2015-05-11 MED ORDER — FAMOTIDINE IN NACL 20-0.9 MG/50ML-% IV SOLN
20.0000 mg | Freq: Once | INTRAVENOUS | Status: AC
  Administered 2015-05-11: 20 mg via INTRAVENOUS

## 2015-05-11 MED ORDER — PACLITAXEL CHEMO INJECTION 300 MG/50ML
45.0000 mg/m2 | Freq: Once | INTRAVENOUS | Status: AC
  Administered 2015-05-11: 90 mg via INTRAVENOUS
  Filled 2015-05-11: qty 15

## 2015-05-11 NOTE — Progress Notes (Signed)
Oncology Nurse Navigator Documentation  Oncology Nurse Navigator Flowsheets 05/11/2015  Navigator Encounter Type Treatment  Patient Visit Type Medonc  Treatment Phase First Chemo Tx  Barriers/Navigation Needs No barriers at this time  Interventions Coordination of Care/Spoke with patient and her daughter today at her 1st chemotherapy tx.  They do not have any questions about tx but need help with medication refill.  I spoke with Dr. Julien Nordmann about refill and he gave me the prescription for the patient.  I took it to chemo room and gave it to patient.  She was thankful for the prescription.   Coordination of Care Medication refill  Education Method None needed at this time  Time Spent with Patient 30

## 2015-05-11 NOTE — Telephone Encounter (Signed)
Appointments made and patient will get a new avs in chemo °

## 2015-05-11 NOTE — Patient Instructions (Signed)
[facility] Discharge Instructions for Patients Receiving Chemotherapy  Today you received the following chemotherapy agents taxol and carboplatin.  To help prevent nausea and vomiting after your treatment, we encourage you to take your nausea medication compazine.   If you develop nausea and vomiting that is not controlled by your nausea medication, call the clinic.   BELOW ARE SYMPTOMS THAT SHOULD BE REPORTED IMMEDIATELY:  *FEVER GREATER THAN 100.5 F  *CHILLS WITH OR WITHOUT FEVER  NAUSEA AND VOMITING THAT IS NOT CONTROLLED WITH YOUR NAUSEA MEDICATION  *UNUSUAL SHORTNESS OF BREATH  *UNUSUAL BRUISING OR BLEEDING  TENDERNESS IN MOUTH AND THROAT WITH OR WITHOUT PRESENCE OF ULCERS  *URINARY PROBLEMS  *BOWEL PROBLEMS  UNUSUAL RASH Items with * indicate a potential emergency and should be followed up as soon as possible.  Feel free to call the clinic you have any questions or concerns. The clinic phone number is [phone].  Please show the [facility] at check-in to the Emergency Department and triage nurse. Paclitaxel injection What is this medicine? PACLITAXEL (PAK li TAX el) is a chemotherapy drug. It targets fast dividing cells, like cancer cells, and causes these cells to die. This medicine is used to treat ovarian cancer, breast cancer, and other cancers. This medicine may be used for other purposes; ask your health care provider or pharmacist if you have questions. COMMON BRAND NAME(S): Onxol, Taxol What should I tell my health care provider before I take this medicine? They need to know if you have any of these conditions: -blood disorders -irregular heartbeat -infection (especially a virus infection such as chickenpox, cold sores, or herpes) -liver disease -previous or ongoing radiation therapy -an unusual or allergic reaction to paclitaxel, alcohol, polyoxyethylated castor oil, other chemotherapy agents, other medicines, foods, dyes, or  preservatives -pregnant or trying to get pregnant -breast-feeding How should I use this medicine? This drug is given as an infusion into a vein. It is administered in a hospital or clinic by a specially trained health care professional. Talk to your pediatrician regarding the use of this medicine in children. Special care may be needed. Overdosage: If you think you have taken too much of this medicine contact a poison control center or emergency room at once. NOTE: This medicine is only for you. Do not share this medicine with others. What if I miss a dose? It is important not to miss your dose. Call your doctor or health care professional if you are unable to keep an appointment. What may interact with this medicine? Do not take this medicine with any of the following medications: -disulfiram -metronidazole This medicine may also interact with the following medications: -cyclosporine -diazepam -ketoconazole -medicines to increase blood counts like filgrastim, pegfilgrastim, sargramostim -other chemotherapy drugs like cisplatin, doxorubicin, epirubicin, etoposide, teniposide, vincristine -quinidine -testosterone -vaccines -verapamil Talk to your doctor or health care professional before taking any of these medicines: -acetaminophen -aspirin -ibuprofen -ketoprofen -naproxen This list may not describe all possible interactions. Give your health care provider a list of all the medicines, herbs, non-prescription drugs, or dietary supplements you use. Also tell them if you smoke, drink alcohol, or use illegal drugs. Some items may interact with your medicine. What should I watch for while using this medicine? Your condition will be monitored carefully while you are receiving this medicine. You will need important blood work done while you are taking this medicine. This drug may make you feel generally unwell. This is not uncommon, as chemotherapy can affect healthy  cells as well as cancer  cells. Report any side effects. Continue your course of treatment even though you feel ill unless your doctor tells you to stop. In some cases, you may be given additional medicines to help with side effects. Follow all directions for their use. Call your doctor or health care professional for advice if you get a fever, chills or sore throat, or other symptoms of a cold or flu. Do not treat yourself. This drug decreases your body's ability to fight infections. Try to avoid being around people who are sick. This medicine may increase your risk to bruise or bleed. Call your doctor or health care professional if you notice any unusual bleeding. Be careful brushing and flossing your teeth or using a toothpick because you may get an infection or bleed more easily. If you have any dental work done, tell your dentist you are receiving this medicine. Avoid taking products that contain aspirin, acetaminophen, ibuprofen, naproxen, or ketoprofen unless instructed by your doctor. These medicines may hide a fever. Do not become pregnant while taking this medicine. Women should inform their doctor if they wish to become pregnant or think they might be pregnant. There is a potential for serious side effects to an unborn child. Talk to your health care professional or pharmacist for more information. Do not breast-feed an infant while taking this medicine. Men are advised not to father a child while receiving this medicine. What side effects may I notice from receiving this medicine? Side effects that you should report to your doctor or health care professional as soon as possible: -allergic reactions like skin rash, itching or hives, swelling of the face, lips, or tongue -low blood counts - This drug may decrease the number of white blood cells, red blood cells and platelets. You may be at increased risk for infections and bleeding. -signs of infection - fever or chills, cough, sore throat, pain or difficulty passing  urine -signs of decreased platelets or bleeding - bruising, pinpoint red spots on the skin, black, tarry stools, nosebleeds -signs of decreased red blood cells - unusually weak or tired, fainting spells, lightheadedness -breathing problems -chest pain -high or low blood pressure -mouth sores -nausea and vomiting -pain, swelling, redness or irritation at the injection site -pain, tingling, numbness in the hands or feet -slow or irregular heartbeat -swelling of the ankle, feet, hands Side effects that usually do not require medical attention (report to your doctor or health care professional if they continue or are bothersome): -bone pain -complete hair loss including hair on your head, underarms, pubic hair, eyebrows, and eyelashes -changes in the color of fingernails -diarrhea -loosening of the fingernails -loss of appetite -muscle or joint pain -red flush to skin -sweating This list may not describe all possible side effects. Call your doctor for medical advice about side effects. You may report side effects to FDA at 1-800-FDA-1088. Where should I keep my medicine? This drug is given in a hospital or clinic and will not be stored at home. NOTE: This sheet is a summary. It may not cover all possible information. If you have questions about this medicine, talk to your doctor, pharmacist, or health care provider.  2015, [employer] Standard. (2013-01-07 16:41:21) Carboplatin injection What is this medicine? CARBOPLATIN (KAR boe pla tin) is a chemotherapy drug. It targets fast dividing cells, like cancer cells, and causes these cells to die. This medicine is used to treat ovarian cancer and many other cancers. This medicine may be used for other  purposes; ask your health care provider or pharmacist if you have questions. COMMON BRAND NAME(S): Paraplatin What should I tell my health care provider before I take this medicine? They need to know if you have any of these  conditions: -blood disorders -hearing problems -kidney disease -recent or ongoing radiation therapy -an unusual or allergic reaction to carboplatin, cisplatin, other chemotherapy, other medicines, foods, dyes, or preservatives -pregnant or trying to get pregnant -breast-feeding How should I use this medicine? This drug is usually given as an infusion into a vein. It is administered in a hospital or clinic by a specially trained health care professional. Talk to your pediatrician regarding the use of this medicine in children. Special care may be needed. Overdosage: If you think you have taken too much of this medicine contact a poison control center or emergency room at once. NOTE: This medicine is only for you. Do not share this medicine with others. What if I miss a dose? It is important not to miss a dose. Call your doctor or health care professional if you are unable to keep an appointment. What may interact with this medicine? -medicines for seizures -medicines to increase blood counts like filgrastim, pegfilgrastim, sargramostim -some antibiotics like amikacin, gentamicin, neomycin, streptomycin, tobramycin -vaccines Talk to your doctor or health care professional before taking any of these medicines: -acetaminophen -aspirin -ibuprofen -ketoprofen -naproxen This list may not describe all possible interactions. Give your health care provider a list of all the medicines, herbs, non-prescription drugs, or dietary supplements you use. Also tell them if you smoke, drink alcohol, or use illegal drugs. Some items may interact with your medicine. What should I watch for while using this medicine? Your condition will be monitored carefully while you are receiving this medicine. You will need important blood work done while you are taking this medicine. This drug may make you feel generally unwell. This is not uncommon, as chemotherapy can affect healthy cells as well as cancer cells. Report  any side effects. Continue your course of treatment even though you feel ill unless your doctor tells you to stop. In some cases, you may be given additional medicines to help with side effects. Follow all directions for their use. Call your doctor or health care professional for advice if you get a fever, chills or sore throat, or other symptoms of a cold or flu. Do not treat yourself. This drug decreases your body's ability to fight infections. Try to avoid being around people who are sick. This medicine may increase your risk to bruise or bleed. Call your doctor or health care professional if you notice any unusual bleeding. Be careful brushing and flossing your teeth or using a toothpick because you may get an infection or bleed more easily. If you have any dental work done, tell your dentist you are receiving this medicine. Avoid taking products that contain aspirin, acetaminophen, ibuprofen, naproxen, or ketoprofen unless instructed by your doctor. These medicines may hide a fever. Do not become pregnant while taking this medicine. Women should inform their doctor if they wish to become pregnant or think they might be pregnant. There is a potential for serious side effects to an unborn child. Talk to your health care professional or pharmacist for more information. Do not breast-feed an infant while taking this medicine. What side effects may I notice from receiving this medicine? Side effects that you should report to your doctor or health care professional as soon as possible: -allergic reactions like skin rash, itching or  hives, swelling of the face, lips, or tongue -signs of infection - fever or chills, cough, sore throat, pain or difficulty passing urine -signs of decreased platelets or bleeding - bruising, pinpoint red spots on the skin, black, tarry stools, nosebleeds -signs of decreased red blood cells - unusually weak or tired, fainting spells, lightheadedness -breathing  problems -changes in hearing -changes in vision -chest pain -high blood pressure -low blood counts - This drug may decrease the number of white blood cells, red blood cells and platelets. You may be at increased risk for infections and bleeding. -nausea and vomiting -pain, swelling, redness or irritation at the injection site -pain, tingling, numbness in the hands or feet -problems with balance, talking, walking -trouble passing urine or change in the amount of urine Side effects that usually do not require medical attention (report to your doctor or health care professional if they continue or are bothersome): -hair loss -loss of appetite -metallic taste in the mouth or changes in taste This list may not describe all possible side effects. Call your doctor for medical advice about side effects. You may report side effects to FDA at 1-800-FDA-1088. Where should I keep my medicine? This drug is given in a hospital or clinic and will not be stored at home. NOTE: This sheet is a summary. It may not cover all possible information. If you have questions about this medicine, talk to your doctor, pharmacist, or health care provider.  2015, [employer]/Gold Standard. (2008-02-19 14:38:05)

## 2015-05-12 ENCOUNTER — Telehealth: Payer: Self-pay | Admitting: Medical Oncology

## 2015-05-12 NOTE — Telephone Encounter (Signed)
feeling fine after chemo no nausea. She is eating.  She does have new pain in ant chest with deep breath that started yesterday.

## 2015-05-12 NOTE — Telephone Encounter (Signed)
I spoke to daughter and she just gave pt a tylenol and put her on her oxygen for the pain in her chest. She understands to call if worsening symptoms.

## 2015-05-13 ENCOUNTER — Encounter: Payer: Self-pay | Admitting: Surgery

## 2015-05-13 ENCOUNTER — Ambulatory Visit (INDEPENDENT_AMBULATORY_CARE_PROVIDER_SITE_OTHER): Payer: Medicare HMO | Admitting: Surgery

## 2015-05-13 ENCOUNTER — Ambulatory Visit
Admission: RE | Admit: 2015-05-13 | Discharge: 2015-05-13 | Disposition: A | Discharge status: Home / Self Care | Payer: Medicare HMO | Source: Ambulatory Visit | Attending: Surgery | Admitting: Surgery

## 2015-05-13 VITALS — BP 129/63 | HR 63 | Resp 16 | Ht 65.0 in | Wt 190.0 lb

## 2015-05-13 DIAGNOSIS — Z9889 Other specified postprocedural states: Secondary | ICD-10-CM

## 2015-05-13 DIAGNOSIS — C3490 Malignant neoplasm of unspecified part of unspecified bronchus or lung: Secondary | ICD-10-CM | POA: Diagnosis not present

## 2015-05-13 DIAGNOSIS — R599 Enlarged lymph nodes, unspecified: Secondary | ICD-10-CM

## 2015-05-13 DIAGNOSIS — R590 Localized enlarged lymph nodes: Secondary | ICD-10-CM

## 2015-05-13 DIAGNOSIS — R918 Other nonspecific abnormal finding of lung field: Secondary | ICD-10-CM

## 2015-05-13 NOTE — Progress Notes (Signed)
HPI:  Patient returns for routine postoperative follow-up having undergone mediastinoscopy with lymph node biopsy for diagnosis on 04/28/2015. 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 Since the procedure she has started chemotherapy per Dr. Julien Nordmann and has seen Dr. Tammi Klippel for XRT which has not started yet.   Current Outpatient Prescriptions  Medication Sig Dispense Refill  . acetaminophen (TYLENOL) 500 MG tablet Take 500-1,000 mg by mouth every 6 (six) hours as needed for mild pain or headache.    . albuterol (PROVENTIL HFA;VENTOLIN HFA) 108 (90 BASE) MCG/ACT inhaler Inhale 1 puff into the lungs every 6 (six) hours as needed for wheezing or shortness of breath.    . Cholecalciferol (VITAMIN D PO) Take 1 tablet by mouth every morning.     . Cyanocobalamin (VITAMIN B-12 PO) Take 1 capsule by mouth every morning.    Marland Kitchen ELIQUIS 5 MG TABS tablet TAKE 1 TABLET BY MOUTH TWICE DAILY 60 tablet 5  . escitalopram (LEXAPRO) 10 MG tablet Take 1 tablet (10 mg total) by mouth daily. 30 tablet 5  . ezetimibe-simvastatin (VYTORIN) 10-40 MG per tablet Take 1 tablet by mouth daily. 90 tablet 3  . metoprolol tartrate (LOPRESSOR) 25 MG tablet Take 1 tablet (25 mg total) by mouth 2 (two) times daily. 180 tablet 2  . mometasone-formoterol (DULERA) 100-5 MCG/ACT AERO Inhale 2 puffs into the lungs 2 (two) times daily as needed for wheezing.     . Omega-3 Fatty Acids (FISH OIL) 1200 MG CAPS Take 2 capsules by mouth daily.    Marland Kitchen oxyCODONE (ROXICODONE) 5 MG immediate release tablet Take 1 tablet (5 mg total) by mouth every 4 (four) hours as needed for severe pain. 10 tablet 0  . pantoprazole (PROTONIX) 40 MG tablet Take 40 mg by mouth daily.    . Probiotic Product (PROBIOTIC PO) Take 1 capsule by mouth 2 (two) times daily.    . TraMADol HCl 100 MG TB24 Take 100 tablets by mouth once. 30 tablet 0  . traZODone (DESYREL) 100 MG tablet Take 1 tablet (100 mg total) by mouth at bedtime. 30 tablet  0  . loratadine (CLARITIN) 10 MG tablet Take 10 mg by mouth at bedtime as needed for allergies.    Marland Kitchen prochlorperazine (COMPAZINE) 10 MG tablet Take 1 tablet (10 mg total) by mouth every 6 (six) hours as needed for nausea or vomiting. (Patient not taking: Reported on 05/04/2015) 30 tablet 0   No current facility-administered medications for this visit.    Physical Exam: BP 129/63 mmHg  Pulse 63  Resp 16  Ht '5\' 5"'$  (1.651 m)  Wt 190 lb (86.183 kg)  BMI 31.62 kg/m2  SpO2 97% She looks well The incision is healing well with minimal residual swelling.  Diagnostic Tests:  CLINICAL DATA: Lung mass.  EXAM: CHEST 2 VIEW  COMPARISON: None.  FINDINGS: Mediastinum fullness is noted. This could be secondary to adenopathy. Right upper lobe nodular density is noted. This could represent a malignancy. Right upper lobe infiltrate noted. No pleural effusion or pneumothorax. Mild cardiomegaly with normal pulmonary vascularity. No acute bony abnormality. Diffuse osteopenia degenerative change.  IMPRESSION: 1. Right upper lobe nodular density with possible associated mediastinal adenopathy. IV contrast chest CT is suggested for further evaluation . 2. Mild right upper lobe infiltrate. 3. Mild cardiomegaly. No pulmonary venous congestion. a   Electronically Signed  By: Marcello Moores Register  On: 05/13/2015 11:09   Impression:  She is recovering well from the mediastinoscopy. She can  return to normal activity.  Plan:  She will continue chemo and XRT per oncology.   [name], MD [facility] [phone]

## 2015-05-14 ENCOUNTER — Encounter: Payer: Self-pay | Admitting: *Deleted

## 2015-05-14 ENCOUNTER — Other Ambulatory Visit: Payer: Medicare HMO

## 2015-05-14 ENCOUNTER — Other Ambulatory Visit: Payer: Self-pay | Admitting: Internal Medicine

## 2015-05-14 ENCOUNTER — Other Ambulatory Visit: Payer: Self-pay | Admitting: Medical Oncology

## 2015-05-14 DIAGNOSIS — Z510 Encounter for antineoplastic radiation therapy: Secondary | ICD-10-CM | POA: Diagnosis not present

## 2015-05-14 MED ORDER — HYDROCODONE-ACETAMINOPHEN 5-325 MG PO TABS: 1.0000 | ORAL_TABLET | Freq: Four times a day (QID) | ORAL | Status: DC | PRN

## 2015-05-15 ENCOUNTER — Ambulatory Visit
Admission: RE | Admit: 2015-05-15 | Discharge: 2015-05-15 | Disposition: A | Discharge status: Home / Self Care | Payer: Medicare HMO | Source: Ambulatory Visit | Attending: Radiation Oncology | Admitting: Radiation Oncology

## 2015-05-15 DIAGNOSIS — C3411 Malignant neoplasm of upper lobe, right bronchus or lung: Secondary | ICD-10-CM

## 2015-05-15 DIAGNOSIS — Z510 Encounter for antineoplastic radiation therapy: Secondary | ICD-10-CM | POA: Diagnosis not present

## 2015-05-18 ENCOUNTER — Other Ambulatory Visit (HOSPITAL_BASED_OUTPATIENT_CLINIC_OR_DEPARTMENT_OTHER): Payer: Medicare HMO

## 2015-05-18 ENCOUNTER — Ambulatory Visit (HOSPITAL_BASED_OUTPATIENT_CLINIC_OR_DEPARTMENT_OTHER): Payer: Medicare HMO | Admitting: Physician Assistant

## 2015-05-18 ENCOUNTER — Ambulatory Visit (HOSPITAL_BASED_OUTPATIENT_CLINIC_OR_DEPARTMENT_OTHER): Payer: Medicare HMO

## 2015-05-18 ENCOUNTER — Ambulatory Visit
Admission: RE | Admit: 2015-05-18 | Discharge: 2015-05-18 | Disposition: A | Discharge status: Home / Self Care | Payer: Medicare HMO | Source: Ambulatory Visit | Attending: Radiation Oncology | Admitting: Radiation Oncology

## 2015-05-18 ENCOUNTER — Telehealth: Payer: Self-pay | Admitting: Physician Assistant

## 2015-05-18 ENCOUNTER — Telehealth: Payer: Self-pay | Admitting: *Deleted

## 2015-05-18 ENCOUNTER — Encounter: Payer: Self-pay | Admitting: Physician Assistant

## 2015-05-18 VITALS — BP 99/74 | HR 61 | Temp 98.0°F | Resp 18 | Wt 190.3 lb

## 2015-05-18 DIAGNOSIS — C3491 Malignant neoplasm of unspecified part of right bronchus or lung: Secondary | ICD-10-CM

## 2015-05-18 DIAGNOSIS — C771 Secondary and unspecified malignant neoplasm of intrathoracic lymph nodes: Secondary | ICD-10-CM | POA: Diagnosis not present

## 2015-05-18 DIAGNOSIS — Z510 Encounter for antineoplastic radiation therapy: Secondary | ICD-10-CM | POA: Diagnosis not present

## 2015-05-18 DIAGNOSIS — Z5111 Encounter for antineoplastic chemotherapy: Secondary | ICD-10-CM

## 2015-05-18 DIAGNOSIS — C3411 Malignant neoplasm of upper lobe, right bronchus or lung: Secondary | ICD-10-CM

## 2015-05-18 LAB — COMPREHENSIVE METABOLIC PANEL (CC13)
ALBUMIN: 3.2 g/dL — AB (ref 3.5–5.0)
ALK PHOS: 46 U/L (ref 40–150)
ALT: 13 U/L (ref 0–55)
ANION GAP: 7 meq/L (ref 3–11)
AST: 12 U/L (ref 5–34)
BUN: 17.3 mg/dL (ref 7.0–26.0)
CO2: 27 meq/L (ref 22–29)
Calcium: 9.2 mg/dL (ref 8.4–10.4)
Chloride: 104 mEq/L (ref 98–109)
Creatinine: 0.8 mg/dL (ref 0.6–1.1)
EGFR: 67 mL/min/{1.73_m2} — AB (ref >90)
Glucose: 135 mg/dl (ref 70–140)
POTASSIUM: 4.3 meq/L (ref 3.5–5.1)
SODIUM: 138 meq/L (ref 136–145)
TOTAL PROTEIN: 6.3 g/dL — AB (ref 6.4–8.3)
Total Bilirubin: 0.26 mg/dL (ref 0.20–1.20)

## 2015-05-18 LAB — CBC WITH DIFFERENTIAL/PLATELET
BASO%: 0.2 % (ref 0.0–2.0)
Basophils Absolute: 0 10*3/uL (ref 0.0–0.1)
EOS%: 2.9 % (ref 0.0–7.0)
Eosinophils Absolute: 0.2 10*3/uL (ref 0.0–0.5)
HEMATOCRIT: 34.6 % — AB (ref 34.8–46.6)
HGB: 11.3 g/dL — ABNORMAL LOW (ref 11.6–15.9)
LYMPH#: 1.4 10*3/uL (ref 0.9–3.3)
LYMPH%: 18.6 % (ref 14.0–49.7)
MCH: 28 pg (ref 25.1–34.0)
MCHC: 32.7 g/dL (ref 31.5–36.0)
MCV: 85.6 fL (ref 79.5–101.0)
MONO#: 0.3 10*3/uL (ref 0.1–0.9)
MONO%: 4.7 % (ref 0.0–14.0)
NEUT#: 5.4 10*3/uL (ref 1.5–6.5)
NEUT%: 73.6 % (ref 38.4–76.8)
Platelets: 353 10*3/uL (ref 145–400)
RBC: 4.04 10*6/uL (ref 3.70–5.45)
RDW: 13.9 % (ref 11.2–14.5)
WBC: 7.3 10*3/uL (ref 3.9–10.3)

## 2015-05-18 MED ORDER — SODIUM CHLORIDE 0.9 % IV SOLN
176.8000 mg | Freq: Once | INTRAVENOUS | Status: AC
  Administered 2015-05-18: 180 mg via INTRAVENOUS
  Filled 2015-05-18: qty 18

## 2015-05-18 MED ORDER — FAMOTIDINE IN NACL 20-0.9 MG/50ML-% IV SOLN
INTRAVENOUS | Status: AC
  Filled 2015-05-18: qty 50

## 2015-05-18 MED ORDER — SODIUM CHLORIDE 0.9 % IV SOLN
Freq: Once | INTRAVENOUS | Status: AC
  Administered 2015-05-18: 12:00:00 via INTRAVENOUS
  Filled 2015-05-18: qty 8

## 2015-05-18 MED ORDER — PACLITAXEL CHEMO INJECTION 300 MG/50ML
45.0000 mg/m2 | Freq: Once | INTRAVENOUS | Status: AC
  Administered 2015-05-18: 90 mg via INTRAVENOUS
  Filled 2015-05-18: qty 15

## 2015-05-18 MED ORDER — SODIUM CHLORIDE 0.9 % IV SOLN
Freq: Once | INTRAVENOUS | Status: AC
  Administered 2015-05-18: 12:00:00 via INTRAVENOUS

## 2015-05-18 MED ORDER — FAMOTIDINE IN NACL 20-0.9 MG/50ML-% IV SOLN
20.0000 mg | Freq: Once | INTRAVENOUS | Status: AC
  Administered 2015-05-18: 20 mg via INTRAVENOUS

## 2015-05-18 MED ORDER — DIPHENHYDRAMINE HCL 50 MG/ML IJ SOLN
INTRAMUSCULAR | Status: AC
  Filled 2015-05-18: qty 1

## 2015-05-18 MED ORDER — DIPHENHYDRAMINE HCL 50 MG/ML IJ SOLN
50.0000 mg | Freq: Once | INTRAMUSCULAR | Status: AC
  Administered 2015-05-18: 50 mg via INTRAVENOUS

## 2015-05-18 NOTE — Patient Instructions (Signed)
Continue your course of concurrent chemoradiation as scheduled Follow up in 3 weeks 

## 2015-05-18 NOTE — Telephone Encounter (Signed)
Per staff message and POF I have scheduled appts. Advised scheduler of appts. JMW  

## 2015-05-18 NOTE — Progress Notes (Addendum)
No images are attached to the encounter. No scans are attached to the encounter. No scans are attached to the encounter. [facility] VISIT PROGRESS NOTE  [name], [address] [place] [address]  DIAGNOSIS: Non-small cell carcinoma of lung, stage 3   Staging form: Lung, AJCC 7th Edition     Clinical stage from 04/30/2015: Stage IIIA (T1a, N2, M0) - Signed by Curt Bears, MD on 04/30/2015  PRIOR THERAPY: None  CURRENT THERAPY: Patient to begin a course of concurrent chemoradiation with chemotherapy in the form of weekly carboplatin for an AUC of 2 and paclitaxel at 45 mg/m given concurrent with radiation.  INTERVAL HISTORY: Pamela Mcguire 80 y.o. female returns for a scheduled regular office visit for followup of her recently diagnosed stage IIIa non-small cell lung cancer. She was evaluated in radiation oncology by Dr. Tammi Klippel and has had her port films. She is to start her radiation this week. She is accompanied by one of her daughters.   She is currently on oxygen at 2 L via nasal cannula. She voices no specific complaints today.  MEDICAL HISTORY: Past Medical History  Diagnosis Date  . Hyperlipidemia   . Diverticulitis   . CAD (coronary artery disease)   . AF (atrial fibrillation)   . Hypertension   . Seasonal allergies   . Prediabetes   . Depression   . Shortness of breath dyspnea   . Anxiety   . Urinary frequency   . GERD (gastroesophageal reflux disease)   . Breast cancer     right mastectomy  . Sleep apnea      dx'd 10/2014, has not received cpap yet (04/28/2015)  . Arthritis     "thighs, hips, arms" (04/28/2015)  . Non-small cell carcinoma of lung, stage 3 04/30/2015    ALLERGIES:  has No Known Allergies.  MEDICATIONS:  Current Outpatient Prescriptions  Medication Sig Dispense Refill  . acetaminophen (TYLENOL) 500 MG tablet Take 500-1,000 mg by mouth every 6 (six) hours as needed for mild pain or  headache.    . albuterol (PROVENTIL HFA;VENTOLIN HFA) 108 (90 BASE) MCG/ACT inhaler Inhale 1 puff into the lungs every 6 (six) hours as needed for wheezing or shortness of breath.    . Cholecalciferol (VITAMIN D PO) Take 1 tablet by mouth every morning.     . Cyanocobalamin (VITAMIN B-12 PO) Take 1 capsule by mouth every morning.    Marland Kitchen ELIQUIS 5 MG TABS tablet TAKE 1 TABLET BY MOUTH TWICE DAILY 60 tablet 5  . escitalopram (LEXAPRO) 10 MG tablet Take 1 tablet (10 mg total) by mouth daily. 30 tablet 5  . ezetimibe-simvastatin (VYTORIN) 10-40 MG per tablet Take 1 tablet by mouth daily. 90 tablet 3  . HYDROcodone-acetaminophen (NORCO/VICODIN) 5-325 MG per tablet Take 1 tablet by mouth every 6 (six) hours as needed for moderate pain. 30 tablet 0  . loratadine (CLARITIN) 10 MG tablet Take 10 mg by mouth at bedtime as needed for allergies.    . metoprolol tartrate (LOPRESSOR) 25 MG tablet Take 1 tablet (25 mg total) by mouth 2 (two) times daily. 180 tablet 2  . mometasone-formoterol (DULERA) 100-5 MCG/ACT AERO Inhale 2 puffs into the lungs 2 (two) times daily as needed for wheezing.     . Omega-3 Fatty Acids (FISH OIL) 1200 MG CAPS Take 2 capsules by mouth daily.    Marland Kitchen oxyCODONE (ROXICODONE) 5 MG immediate release tablet Take 1 tablet (5 mg total)  by mouth every 4 (four) hours as needed for severe pain. 10 tablet 0  . pantoprazole (PROTONIX) 40 MG tablet Take 40 mg by mouth daily.    . Probiotic Product (PROBIOTIC PO) Take 1 capsule by mouth 2 (two) times daily.    . prochlorperazine (COMPAZINE) 10 MG tablet Take 1 tablet (10 mg total) by mouth every 6 (six) hours as needed for nausea or vomiting. 30 tablet 0  . traZODone (DESYREL) 100 MG tablet Take 1 tablet (100 mg total) by mouth at bedtime. 30 tablet 0  . TraMADol HCl 100 MG TB24 Take 100 tablets by mouth once. (Patient not taking: Reported on 05/18/2015) 30 tablet 0   No current facility-administered medications for this visit.    Facility-Administered Medications Ordered in Other Visits  Medication Dose Route Frequency Provider Last Rate Last Dose  . CARBOplatin (PARAPLATIN) 180 mg in sodium chloride 0.9 % 100 mL chemo infusion  180 mg Intravenous Once Curt Bears, MD 236 mL/hr at 05/18/15 1341 180 mg at 05/18/15 1341    SURGICAL HISTORY:  Past Surgical History  Procedure Laterality Date  . Cardioversion    . Tonsillectomy    . Colonoscopy    . Cesarean section  X 2  . Mastectomy Right   . Breast surgery    . Mediastinoscopy N/A 04/28/2015    Procedure: MEDIASTINOSCOPY;  Surgeon: [name], MD;  Location: [facility] OR;  Service: Thoracic;  Laterality: N/A;    REVIEW OF SYSTEMS:  Review of Systems  Constitutional: Negative for fever, chills, weight loss, malaise/fatigue and diaphoresis.  HENT: Negative for congestion, ear discharge, ear pain, hearing loss, nosebleeds, sore throat and tinnitus.   Eyes: Negative for blurred vision, double vision, photophobia, pain, discharge and redness.  Respiratory: Negative for cough, hemoptysis, sputum production, shortness of breath, wheezing and stridor.   Cardiovascular: Negative for chest pain, palpitations, orthopnea, claudication, leg swelling and PND.  Gastrointestinal: Negative for heartburn, abdominal pain, diarrhea, constipation, blood in stool and melena.  Genitourinary: Negative.   Musculoskeletal: Negative.   Skin: Negative.   Neurological: Negative for dizziness, tingling, focal weakness, seizures, weakness and headaches.  Endo/Heme/Allergies: Does not bruise/bleed easily.  Psychiatric/Behavioral: Negative for depression. The patient is not nervous/anxious and does not have insomnia.      PHYSICAL EXAMINATION: Physical Exam  Constitutional: She is oriented to person, place, and time and well-developed, well-nourished, and in no distress.  Comfortably seated and wheelchair on 2 L of oxygen via nasal cannula  HENT:  Head: Normocephalic and atraumatic.   Mouth/Throat: Oropharynx is clear and moist.  Eyes: Pupils are equal, round, and reactive to light.  Neck: Normal range of motion. Neck supple. No JVD present. No tracheal deviation present. No thyromegaly present.  Cardiovascular: Normal rate, regular rhythm, normal heart sounds and intact distal pulses.  Exam reveals no gallop and no friction rub.   No murmur heard. Pulmonary/Chest: Effort normal and breath sounds normal. No respiratory distress. She has no wheezes. She has no rales.  Abdominal: Soft. Bowel sounds are normal. She exhibits no distension and no mass. There is no tenderness.  Musculoskeletal: Normal range of motion. She exhibits no edema or tenderness.  Lymphadenopathy:    She has no cervical adenopathy.  Neurological: She is alert and oriented to person, place, and time. She has normal reflexes. Gait normal.  Skin: Skin is warm and dry. No rash noted.    ECOG PERFORMANCE STATUS: 2 - Symptomatic, <50% confined to bed  Blood pressure 99/74, pulse  61, temperature 98 F (36.7 C), resp. rate 18, weight 190 lb 4.8 oz (86.32 kg), SpO2 95 %.  LABORATORY DATA: Lab Results  Component Value Date   WBC 7.3 05/18/2015   HGB 11.3* 05/18/2015   HCT 34.6* 05/18/2015   MCV 85.6 05/18/2015   PLT 353 05/18/2015      Chemistry      Component Value Date/Time   NA 138 05/18/2015 1039   NA 140 04/28/2015 0710   K 4.3 05/18/2015 1039   K 4.4 04/28/2015 0710   CL 104 04/28/2015 0710   CO2 27 05/18/2015 1039   CO2 27 04/28/2015 0710   BUN 17.3 05/18/2015 1039   BUN 15 04/28/2015 0710   CREATININE 0.8 05/18/2015 1039   CREATININE 0.87 04/28/2015 0710   CREATININE 0.91 06/11/2014 1608      Component Value Date/Time   CALCIUM 9.2 05/18/2015 1039   CALCIUM 9.1 04/28/2015 0710   ALKPHOS 46 05/18/2015 1039   ALKPHOS 42 04/28/2015 0710   AST 12 05/18/2015 1039   AST 16 04/28/2015 0710   ALT 13 05/18/2015 1039   ALT 12* 04/28/2015 0710   BILITOT 0.26 05/18/2015 1039   BILITOT  0.2* 04/28/2015 0710       RADIOGRAPHIC STUDIES:  Dg Chest 2 View  05/13/2015   CLINICAL DATA:  Lung mass.  EXAM: CHEST  2 VIEW  COMPARISON:  None.  FINDINGS: Mediastinum fullness is noted. This could be secondary to adenopathy. Right upper lobe nodular density is noted. This could represent a malignancy. Right upper lobe infiltrate noted. No pleural effusion or pneumothorax. Mild cardiomegaly with normal pulmonary vascularity. No acute bony abnormality. Diffuse osteopenia degenerative change.  IMPRESSION: 1. Right upper lobe nodular density with possible associated mediastinal adenopathy. IV contrast chest CT is suggested for further evaluation . 2. Mild right upper lobe infiltrate. 3. Mild cardiomegaly.  No pulmonary venous congestion.  a   Electronically Signed   By: [place]   On: 05/13/2015 11:09   Dg Chest 2 View  04/28/2015   CLINICAL DATA:  Preop  EXAM: CHEST  2 VIEW  COMPARISON:  None.  FINDINGS: The heart is mildly enlarged. Interstitial prominence is noted. Lungs are hyperaerated. No pneumothorax or pleural effusion  The lung parenchyma is abnormal. Throughout the right upper lobe, there is increased hazy opacity with increased reticular markings. There is also an ill-defined focal opacity in the central right upper lobe. Vague opacity at the medial left apex is noted. Patchy densities are present at the right lung base.  IMPRESSION: Cardiomegaly without edema.  Stigmata of COPD.  There are abnormal features in the lung parenchyma including ill-defined opacities in the upper lung zones. Focal masses cannot be excluded. There is also patchy opacity at the right base which may represent airspace disease and hazy density throughout the right upper lobe which is nonspecific. Correlation with prior imaging is recommended.   Electronically Signed   By: Marybelle Killings M.D.   On: 04/28/2015 08:01   Mr Brain Wo Contrast  04/29/2015   CLINICAL DATA:  Lung cancer. Personal history breast cancer.  Abnormal speech and gait.  EXAM: MRI HEAD WITHOUT CONTRAST  TECHNIQUE: Multiplanar, multiecho pulse sequences of the brain and surrounding structures were obtained without intravenous contrast.  COMPARISON:  None.  FINDINGS: Remote lacunar infarcts are present in the basal ganglia and cerebellum bilaterally. Moderate scratch the remote lacunar infarct is present in the right coronal radiata. Moderate atrophy and white matter disease is present  bilaterally. The ventricles are proportionate to the degree of atrophy.  Flow is present in the major intracranial arteries. The globes and orbits are intact. The paranasal sinuses and mastoid air cells are clear.  IMPRESSION: 1. No focal mass lucent to suggest metastatic disease to the brain. 2. Multiple remote lacunar infarcts of the basal ganglia and cerebellum bilaterally. 3. Advanced atrophy and white matter disease likely reflects the sequela of chronic microvascular ischemia.   Electronically Signed   By: San Morelle M.D.   On: 04/29/2015 21:06   Dg Chest Port 1 View  04/28/2015   CLINICAL DATA:  Post mediastinal surgery. Patient underwent mediastinoscopy with lymph node biopsies for mediastinal lymph node.  EXAM: PORTABLE CHEST - 1 VIEW  COMPARISON:  Chest radiograph 04/28/2015 and PET-CT 04/10/2015. Chest radiograph 03/18/2015.  FINDINGS: Heart size is within normal limits and stable. There is heavy atherosclerotic calcification of the thoracic aortic arch. Lung volumes are low-normal. There are chronic bronchitic changes throughout both lungs, unchanged. Ill-defined approximately 18 mm mass is noted in the right upper lobe, recently evaluated with PET-CT. Thickening of the right paratracheal stripe corresponds to known mediastinal lymphadenopathy as described on recent PET-CT.  Negative for pneumothorax. No visible pneumomediastinum. No subcutaneous air. No visible pleural effusion. Right axillary surgical clips are present. Evidence of right mastectomy.   IMPRESSION: 1. No acute findings identified in the chest, in this patient status post mediastinoscopy. Chronic bronchitic changes are stable. 2. Right upper lobe mass, as described on recent PET-CT. 3. Evidence of mediastinal lymphadenopathy, as described on recent PET-CT. 4. Thoracic aorta atherosclerosis 5. Right mastectomy right axillary nodal dissection.   Electronically Signed   By: Curlene Dolphin M.D.   On: 04/28/2015 11:19     ASSESSMENT/PLAN:  No problem-specific assessment & plan notes found for this encounter.  the patient is a pleasant 80 year old Caucasian female recently diagnosed with stage IIIa non-small cell lung cancer. She is currently on oxygen at 2 L via nasal cannula. She still has some soreness from her recent mediastinal endoscopy. The MRI of her brain was negative for brain metastasis. The patient was discussed with and also seen by Dr. Julien Nordmann. The plan is for her to proceed with a course of concurrent chemoradiation with chemotherapy in the form of carboplatin for an AUC of 2 and paclitaxel 45 mg/m given weekly concurrent with radiation starting today.  She will follow-up in 3 weeks for symptom management visit.   Awilda Metro E, PA-C 05/18/2015  All questions were answered. The patient knows to call the clinic with any problems, questions or concerns. We can certainly see the patient much sooner if necessary.  ADDENDUM: Hematology/Oncology Attending: I had a face to face encounter with the patient today. I recommended her care plan. This is a very pleasant 80 years old white female recently diagnosed with a stage IIIa non-small cell lung cancer and currently undergoing concurrent chemoradiation with weekly carboplatin and paclitaxel is status post 1 cycle. She tolerated the first week of her treatment well. She denied having any significant nausea or vomiting. I recommended for the patient to proceed with cycle #2 of her treatment today as a scheduled. She would come  back for follow-up visit in 3 weeks for reevaluation and management of any adverse effect of her treatment. The patient was advised to call immediately if she has any concerning symptoms in the interval.  Disclaimer: This note was dictated with voice recognition software. Similar sounding words can inadvertently be transcribed and may  not be corrected upon review. Eilleen Kempf., MD 05/18/2015

## 2015-05-18 NOTE — Patient Instructions (Signed)
[facility] Discharge Instructions for Patients Receiving Chemotherapy  Today you received the following chemotherapy agents Paclitaxel/Carboplatin.  To help prevent nausea and vomiting after your treatment, we encourage you to take your nausea medication as directed.    If you develop nausea and vomiting that is not controlled by your nausea medication, call the clinic.   BELOW ARE SYMPTOMS THAT SHOULD BE REPORTED IMMEDIATELY:  *FEVER GREATER THAN 100.5 F  *CHILLS WITH OR WITHOUT FEVER  NAUSEA AND VOMITING THAT IS NOT CONTROLLED WITH YOUR NAUSEA MEDICATION  *UNUSUAL SHORTNESS OF BREATH  *UNUSUAL BRUISING OR BLEEDING  TENDERNESS IN MOUTH AND THROAT WITH OR WITHOUT PRESENCE OF ULCERS  *URINARY PROBLEMS  *BOWEL PROBLEMS  UNUSUAL RASH Items with * indicate a potential emergency and should be followed up as soon as possible.  Feel free to call the clinic you have any questions or concerns. The clinic phone number is [phone].  Please show the [facility] at check-in to the Emergency Department and triage nurse.

## 2015-05-18 NOTE — Telephone Encounter (Signed)
Gave avs & calendar for June & July. Sent message to schedule treatment for 07/05

## 2015-05-19 ENCOUNTER — Ambulatory Visit
Admission: RE | Admit: 2015-05-19 | Discharge: 2015-05-19 | Disposition: A | Discharge status: Home / Self Care | Payer: Medicare HMO | Source: Ambulatory Visit | Attending: Radiation Oncology | Admitting: Radiation Oncology

## 2015-05-19 DIAGNOSIS — Z510 Encounter for antineoplastic radiation therapy: Secondary | ICD-10-CM | POA: Diagnosis not present

## 2015-05-20 ENCOUNTER — Ambulatory Visit
Admission: RE | Admit: 2015-05-20 | Discharge: 2015-05-20 | Disposition: A | Discharge status: Home / Self Care | Payer: Medicare HMO | Source: Ambulatory Visit | Attending: Radiation Oncology | Admitting: Radiation Oncology

## 2015-05-20 DIAGNOSIS — Z510 Encounter for antineoplastic radiation therapy: Secondary | ICD-10-CM | POA: Diagnosis not present

## 2015-05-21 ENCOUNTER — Ambulatory Visit
Admission: RE | Admit: 2015-05-21 | Discharge: 2015-05-21 | Disposition: A | Discharge status: Home / Self Care | Payer: Medicare HMO | Source: Ambulatory Visit | Attending: Radiation Oncology | Admitting: Radiation Oncology

## 2015-05-21 ENCOUNTER — Telehealth: Payer: Self-pay | Admitting: *Deleted

## 2015-05-21 VITALS — BP 146/84 | HR 70 | Resp 16 | Wt 192.2 lb

## 2015-05-21 DIAGNOSIS — Z510 Encounter for antineoplastic radiation therapy: Secondary | ICD-10-CM | POA: Diagnosis not present

## 2015-05-21 DIAGNOSIS — C3411 Malignant neoplasm of upper lobe, right bronchus or lung: Secondary | ICD-10-CM

## 2015-05-21 MED ORDER — SUCRALFATE 1 G PO TABS: 1.0000 g | ORAL_TABLET | Freq: Three times a day (TID) | ORAL | Status: DC

## 2015-05-21 NOTE — Telephone Encounter (Signed)
[facility] faxed Prior authorization request for Tramadol.  Request to Managed Care for review.

## 2015-05-21 NOTE — Progress Notes (Signed)
  Radiation Oncology         [phone]   Name: Pamela Mcguire MRN: [id number]   Date: 05/21/2015  DOB: Jan 14, 1935   Weekly Radiation Therapy Management    ICD-9-CM ICD-10-CM   1. Adenocarcinoma of upper lobe of right lung 162.3 C34.11 sucralfate (CARAFATE) 1 G tablet    Current Dose: 8 Gy  Planned Dose:  66 Gy  Narrative The patient presents for routine under treatment assessment. The pt has started to feel "pressure pains" while swallowing. The pt reports she is taking nausea medication. Set-up films were reviewed. The chart was checked.  Physical Findings Weight essentially stable.  No significant changes.  Impression The patient is tolerating radiation.  Plan Continue treatment as planned. I will prescribe Carafate for the pt to help with her difficulty swallowing. The pt's pharmacy is the [facility] in [place]    This document serves as a record of services personally performed by Tyler Pita, MD. It was created on his behalf by Darcus Austin, a trained medical scribe. The creation of this record is based on the scribe's personal observations and the provider's statements to them. This document has been checked and approved by the attending provider.      Pamela Mcguire Pamela Mcguire, M.D.

## 2015-05-22 ENCOUNTER — Ambulatory Visit
Admission: RE | Admit: 2015-05-22 | Discharge: 2015-05-22 | Disposition: A | Discharge status: Home / Self Care | Payer: Medicare HMO | Source: Ambulatory Visit | Attending: Radiation Oncology | Admitting: Radiation Oncology

## 2015-05-22 ENCOUNTER — Encounter: Payer: Self-pay | Admitting: Internal Medicine

## 2015-05-22 DIAGNOSIS — Z510 Encounter for antineoplastic radiation therapy: Secondary | ICD-10-CM | POA: Diagnosis not present

## 2015-05-22 MED ORDER — RADIAPLEXRX EX GEL
Freq: Once | CUTANEOUS | Status: AC
  Administered 2015-05-22: 14:00:00 via TOPICAL

## 2015-05-22 NOTE — Addendum Note (Signed)
Encounter addended by: Heywood Footman, RN on: 05/22/2015  1:45 PM<BR>     Documentation filed: Inpatient MAR

## 2015-05-22 NOTE — Progress Notes (Signed)
I faxed notes to show what has been tried and not working prior to request for tramadol. [employer] denied because others needed to be tried and failed.

## 2015-05-22 NOTE — Addendum Note (Signed)
Encounter addended by: Heywood Footman, RN on: 05/22/2015  1:43 PM<BR>     Documentation filed: Chief Complaint Section, Medications, Dx Association, [place] Section, Notes Section, Orders

## 2015-05-22 NOTE — Progress Notes (Signed)
Oriented patient to staff and routine of the clinic. Provided patient with RADIATION THERAPY AND YOU handbook then, reviewed pertinent information. Educated patient reference potential side effects and management such as, fatigue, skin changes, and throat changes. Provided patient with [facility] and directed upon use. Encouraged patient to contact this RN with future needs and provided her with a business card. Patient verbalized understanding of all reviewed.

## 2015-05-22 NOTE — Progress Notes (Signed)
I faxed [employer] request for tramadol

## 2015-05-25 ENCOUNTER — Ambulatory Visit (HOSPITAL_BASED_OUTPATIENT_CLINIC_OR_DEPARTMENT_OTHER): Payer: Medicare HMO

## 2015-05-25 ENCOUNTER — Ambulatory Visit
Admission: RE | Admit: 2015-05-25 | Discharge: 2015-05-25 | Disposition: A | Discharge status: Home / Self Care | Payer: Medicare HMO | Source: Ambulatory Visit | Attending: Radiation Oncology | Admitting: Radiation Oncology

## 2015-05-25 ENCOUNTER — Other Ambulatory Visit (HOSPITAL_BASED_OUTPATIENT_CLINIC_OR_DEPARTMENT_OTHER): Payer: Medicare HMO

## 2015-05-25 DIAGNOSIS — Z5111 Encounter for antineoplastic chemotherapy: Secondary | ICD-10-CM | POA: Diagnosis not present

## 2015-05-25 DIAGNOSIS — G2581 Restless legs syndrome: Secondary | ICD-10-CM

## 2015-05-25 DIAGNOSIS — C771 Secondary and unspecified malignant neoplasm of intrathoracic lymph nodes: Secondary | ICD-10-CM | POA: Diagnosis not present

## 2015-05-25 DIAGNOSIS — Z510 Encounter for antineoplastic radiation therapy: Secondary | ICD-10-CM | POA: Diagnosis not present

## 2015-05-25 DIAGNOSIS — C3491 Malignant neoplasm of unspecified part of right bronchus or lung: Secondary | ICD-10-CM

## 2015-05-25 LAB — COMPREHENSIVE METABOLIC PANEL (CC13)
ALBUMIN: 3.1 g/dL — AB (ref 3.5–5.0)
ALK PHOS: 39 U/L — AB (ref 40–150)
ALT: 18 U/L (ref 0–55)
AST: 15 U/L (ref 5–34)
Anion Gap: 7 mEq/L (ref 3–11)
BILIRUBIN TOTAL: 0.38 mg/dL (ref 0.20–1.20)
BUN: 16 mg/dL (ref 7.0–26.0)
CALCIUM: 8.9 mg/dL (ref 8.4–10.4)
CO2: 26 meq/L (ref 22–29)
Chloride: 106 mEq/L (ref 98–109)
Creatinine: 0.8 mg/dL (ref 0.6–1.1)
EGFR: 70 mL/min/{1.73_m2} — AB (ref >90)
GLUCOSE: 128 mg/dL (ref 70–140)
Potassium: 4.4 mEq/L (ref 3.5–5.1)
SODIUM: 138 meq/L (ref 136–145)
Total Protein: 6.2 g/dL — ABNORMAL LOW (ref 6.4–8.3)

## 2015-05-25 LAB — CBC WITH DIFFERENTIAL/PLATELET
BASO%: 0.9 % (ref 0.0–2.0)
Basophils Absolute: 0.1 10*3/uL (ref 0.0–0.1)
EOS ABS: 0.2 10*3/uL (ref 0.0–0.5)
EOS%: 3.2 % (ref 0.0–7.0)
HEMATOCRIT: 33.1 % — AB (ref 34.8–46.6)
HEMOGLOBIN: 10.9 g/dL — AB (ref 11.6–15.9)
LYMPH%: 17.9 % (ref 14.0–49.7)
MCH: 28.4 pg (ref 25.1–34.0)
MCHC: 32.9 g/dL (ref 31.5–36.0)
MCV: 86.2 fL (ref 79.5–101.0)
MONO#: 0.2 10*3/uL (ref 0.1–0.9)
MONO%: 4 % (ref 0.0–14.0)
NEUT%: 74 % (ref 38.4–76.8)
NEUTROS ABS: 4.4 10*3/uL (ref 1.5–6.5)
Platelets: 288 10*3/uL (ref 145–400)
RBC: 3.84 10*6/uL (ref 3.70–5.45)
RDW: 13.9 % (ref 11.2–14.5)
WBC: 5.9 10*3/uL (ref 3.9–10.3)
lymph#: 1.1 10*3/uL (ref 0.9–3.3)

## 2015-05-25 MED ORDER — SODIUM CHLORIDE 0.9 % IV SOLN
Freq: Once | INTRAVENOUS | Status: AC
  Administered 2015-05-25: 15:00:00 via INTRAVENOUS
  Filled 2015-05-25: qty 8

## 2015-05-25 MED ORDER — LORAZEPAM 2 MG/ML IJ SOLN
0.5000 mg | INTRAMUSCULAR | Status: AC
  Administered 2015-05-25: 0.5 mg via INTRAVENOUS

## 2015-05-25 MED ORDER — CARBOPLATIN CHEMO INJECTION 450 MG/45ML
180.0000 mg | Freq: Once | INTRAVENOUS | Status: AC
  Administered 2015-05-25: 180 mg via INTRAVENOUS
  Filled 2015-05-25: qty 18

## 2015-05-25 MED ORDER — DEXTROSE 5 % IV SOLN
45.0000 mg/m2 | Freq: Once | INTRAVENOUS | Status: AC
  Administered 2015-05-25: 90 mg via INTRAVENOUS
  Filled 2015-05-25: qty 15

## 2015-05-25 MED ORDER — FAMOTIDINE IN NACL 20-0.9 MG/50ML-% IV SOLN
20.0000 mg | Freq: Once | INTRAVENOUS | Status: AC
  Administered 2015-05-25: 20 mg via INTRAVENOUS

## 2015-05-25 MED ORDER — FAMOTIDINE IN NACL 20-0.9 MG/50ML-% IV SOLN
INTRAVENOUS | Status: AC
  Filled 2015-05-25: qty 50

## 2015-05-25 MED ORDER — DIPHENHYDRAMINE HCL 50 MG/ML IJ SOLN
50.0000 mg | Freq: Once | INTRAMUSCULAR | Status: AC
  Administered 2015-05-25: 50 mg via INTRAVENOUS

## 2015-05-25 MED ORDER — SODIUM CHLORIDE 0.9 % IV SOLN
Freq: Once | INTRAVENOUS | Status: AC
  Administered 2015-05-25: 14:00:00 via INTRAVENOUS

## 2015-05-25 MED ORDER — DIPHENHYDRAMINE HCL 50 MG/ML IJ SOLN
INTRAMUSCULAR | Status: AC
  Filled 2015-05-25: qty 1

## 2015-05-25 MED ORDER — LORAZEPAM 2 MG/ML IJ SOLN
INTRAMUSCULAR | Status: AC
  Filled 2015-05-25: qty 1

## 2015-05-25 NOTE — Patient Instructions (Signed)
[facility] Discharge Instructions for Patients Receiving Chemotherapy  Today you received the following chemotherapy agents Taxol and Carboplatin  To help prevent nausea and vomiting after your treatment, we encourage you to take your nausea medication Compazine 10 mg every 6 hours as needed.   If you develop nausea and vomiting that is not controlled by your nausea medication, call the clinic.   BELOW ARE SYMPTOMS THAT SHOULD BE REPORTED IMMEDIATELY:  *FEVER GREATER THAN 100.5 F  *CHILLS WITH OR WITHOUT FEVER  NAUSEA AND VOMITING THAT IS NOT CONTROLLED WITH YOUR NAUSEA MEDICATION  *UNUSUAL SHORTNESS OF BREATH  *UNUSUAL BRUISING OR BLEEDING  TENDERNESS IN MOUTH AND THROAT WITH OR WITHOUT PRESENCE OF ULCERS  *URINARY PROBLEMS  *BOWEL PROBLEMS  UNUSUAL RASH Items with * indicate a potential emergency and should be followed up as soon as possible.  Feel free to call the clinic you have any questions or concerns. The clinic phone number is [phone].  Please show the CHEMO ALERT CARD at check-in to the Emergency Department and triage nurse.   

## 2015-05-26 ENCOUNTER — Ambulatory Visit
Admission: RE | Admit: 2015-05-26 | Discharge: 2015-05-26 | Disposition: A | Discharge status: Home / Self Care | Payer: Medicare HMO | Source: Ambulatory Visit | Attending: Radiation Oncology | Admitting: Radiation Oncology

## 2015-05-26 DIAGNOSIS — Z510 Encounter for antineoplastic radiation therapy: Secondary | ICD-10-CM | POA: Diagnosis not present

## 2015-05-27 ENCOUNTER — Ambulatory Visit
Admission: RE | Admit: 2015-05-27 | Discharge: 2015-05-27 | Disposition: A | Discharge status: Home / Self Care | Payer: Medicare HMO | Source: Ambulatory Visit | Attending: Radiation Oncology | Admitting: Radiation Oncology

## 2015-05-27 ENCOUNTER — Encounter: Payer: Self-pay | Admitting: Radiation Oncology

## 2015-05-27 VITALS — BP 145/66 | HR 70 | Resp 16 | Wt 191.8 lb

## 2015-05-27 DIAGNOSIS — C3411 Malignant neoplasm of upper lobe, right bronchus or lung: Secondary | ICD-10-CM

## 2015-05-27 DIAGNOSIS — Z510 Encounter for antineoplastic radiation therapy: Secondary | ICD-10-CM | POA: Diagnosis not present

## 2015-05-27 MED ORDER — LORAZEPAM 1 MG PO TABS: 1.0000 mg | ORAL_TABLET | Freq: Three times a day (TID) | ORAL | Status: DC

## 2015-05-27 NOTE — Progress Notes (Addendum)
Weight and vitals stable. Reports pain in the center of her chest continues 5 on a scale of 0-10. Reports a persistent dry cough. Denies hemoptysis. Denies nausea or vomiting. Swallowing without complication. Reports fatigue. Reports difficulty sleeping. Mild hyperpigmentation without desquamation of right/treated chest.  BP 145/66 mmHg  Pulse 70  Resp 16  Wt 191 lb 12.8 oz (87 kg) Wt Readings from Last 3 Encounters:  05/27/15 191 lb 12.8 oz (87 kg)  05/21/15 192 lb 3.2 oz (87.181 kg)  05/18/15 190 lb 4.8 oz (86.32 kg)

## 2015-05-27 NOTE — Progress Notes (Signed)
  Radiation Oncology         [phone]   Name: Pamela Mcguire MRN: [id number]   Date: 05/27/2015  DOB: [birth year]   Weekly Radiation Therapy Management  Diagnosis: Pamela Mcguire is a 80 year old woman presenting to clinic in regards to her adenocarcinoma of the upper lobe of the right bronchus.  Current Dose: 16 Gy Planned Dose:  66 Gy  Narrative The patient presents for routine under treatment assessment. Set-up films were reviewed. The chart was checked. The patient's weight and vitals are stable. She reports pain in the center of her chest (continues 5 on a scale of 0-10). Reports a persistent dry cough. Denies hemoptysis, nausea, or vomiting. The patient is swallowing without complication, reports fatigue and mild hyperpigmentation without desquamation of right/treated chest. Pamela Mcguire vocalized, "I can't sleep at night." She also reported constant, uncontrollable urine, with no bowel movements (for the past two days) . However, there are no "burning" sensations associated with these symptoms.  Physical Findings Weight essentially stable.  No significant changes.  Impression The patient is tolerating radiation.  Plan The medication Carafate is taken 15 minutes before eating and helping. The patient was prescribed Ativan today (05/27/2015) to alleviate new symptom of difficulty sleeping at night. The side effects and directions of use for both medications were provided to the patient and her daughter. Stool softener was also recommended to address difficulty of bowel movements. We have decided to continue treatment as planned.    This document serves as a record of services personally performed by Tyler Pita, MD. It was created on his behalf by Lenn Cal, a trained medical scribe. The creation of this record is based on the scribe's personal observations and the provider's statements to them. This document has been checked and approved by the attending provider.    Pamela Mcguire,  M.D.

## 2015-05-28 ENCOUNTER — Ambulatory Visit
Admission: RE | Admit: 2015-05-28 | Discharge: 2015-05-28 | Disposition: A | Discharge status: Home / Self Care | Payer: Medicare HMO | Source: Ambulatory Visit | Attending: Radiation Oncology | Admitting: Radiation Oncology

## 2015-05-28 ENCOUNTER — Telehealth: Payer: Self-pay | Admitting: Medical Oncology

## 2015-05-28 DIAGNOSIS — Z510 Encounter for antineoplastic radiation therapy: Secondary | ICD-10-CM | POA: Diagnosis not present

## 2015-05-28 NOTE — Telephone Encounter (Signed)
Tramadol authorized

## 2015-05-29 ENCOUNTER — Ambulatory Visit
Admission: RE | Admit: 2015-05-29 | Discharge: 2015-05-29 | Disposition: A | Discharge status: Home / Self Care | Payer: Medicare HMO | Source: Ambulatory Visit | Attending: Radiation Oncology | Admitting: Radiation Oncology

## 2015-05-29 DIAGNOSIS — Z510 Encounter for antineoplastic radiation therapy: Secondary | ICD-10-CM | POA: Diagnosis not present

## 2015-06-02 ENCOUNTER — Encounter: Payer: Self-pay | Admitting: *Deleted

## 2015-06-02 ENCOUNTER — Ambulatory Visit (HOSPITAL_BASED_OUTPATIENT_CLINIC_OR_DEPARTMENT_OTHER): Payer: Medicare HMO | Admitting: Nurse Practitioner

## 2015-06-02 ENCOUNTER — Ambulatory Visit (HOSPITAL_BASED_OUTPATIENT_CLINIC_OR_DEPARTMENT_OTHER): Payer: Medicare HMO

## 2015-06-02 ENCOUNTER — Other Ambulatory Visit: Payer: Self-pay | Admitting: Nurse Practitioner

## 2015-06-02 ENCOUNTER — Other Ambulatory Visit (HOSPITAL_COMMUNITY)
Admission: RE | Admit: 2015-06-02 | Discharge: 2015-06-02 | Disposition: A | Discharge status: Home / Self Care | Payer: Medicare HMO | Source: Ambulatory Visit | Attending: Internal Medicine | Admitting: Internal Medicine

## 2015-06-02 ENCOUNTER — Ambulatory Visit
Admission: RE | Admit: 2015-06-02 | Discharge: 2015-06-02 | Disposition: A | Discharge status: Home / Self Care | Payer: Medicare HMO | Source: Ambulatory Visit | Attending: Radiation Oncology | Admitting: Radiation Oncology

## 2015-06-02 ENCOUNTER — Other Ambulatory Visit (HOSPITAL_BASED_OUTPATIENT_CLINIC_OR_DEPARTMENT_OTHER): Payer: Medicare HMO

## 2015-06-02 VITALS — BP 122/52 | HR 79 | Temp 98.2°F | Resp 18

## 2015-06-02 DIAGNOSIS — I959 Hypotension, unspecified: Secondary | ICD-10-CM

## 2015-06-02 DIAGNOSIS — C771 Secondary and unspecified malignant neoplasm of intrathoracic lymph nodes: Secondary | ICD-10-CM | POA: Diagnosis not present

## 2015-06-02 DIAGNOSIS — H538 Other visual disturbances: Secondary | ICD-10-CM

## 2015-06-02 DIAGNOSIS — Z510 Encounter for antineoplastic radiation therapy: Secondary | ICD-10-CM | POA: Diagnosis not present

## 2015-06-02 DIAGNOSIS — R531 Weakness: Secondary | ICD-10-CM

## 2015-06-02 DIAGNOSIS — N390 Urinary tract infection, site not specified: Secondary | ICD-10-CM | POA: Diagnosis not present

## 2015-06-02 DIAGNOSIS — H109 Unspecified conjunctivitis: Secondary | ICD-10-CM | POA: Diagnosis not present

## 2015-06-02 DIAGNOSIS — C3491 Malignant neoplasm of unspecified part of right bronchus or lung: Secondary | ICD-10-CM

## 2015-06-02 DIAGNOSIS — C3411 Malignant neoplasm of upper lobe, right bronchus or lung: Secondary | ICD-10-CM | POA: Diagnosis not present

## 2015-06-02 DIAGNOSIS — Z5111 Encounter for antineoplastic chemotherapy: Secondary | ICD-10-CM

## 2015-06-02 LAB — COMPREHENSIVE METABOLIC PANEL (CC13)
ALT: 12 U/L (ref 0–55)
ANION GAP: 9 meq/L (ref 3–11)
AST: 12 U/L (ref 5–34)
Albumin: 3.1 g/dL — ABNORMAL LOW (ref 3.5–5.0)
Alkaline Phosphatase: 40 U/L (ref 40–150)
BUN: 14.3 mg/dL (ref 7.0–26.0)
CALCIUM: 9.6 mg/dL (ref 8.4–10.4)
CHLORIDE: 104 meq/L (ref 98–109)
CO2: 24 meq/L (ref 22–29)
CREATININE: 0.8 mg/dL (ref 0.6–1.1)
EGFR: 66 mL/min/{1.73_m2} — ABNORMAL LOW (ref >90)
GLUCOSE: 119 mg/dL (ref 70–140)
POTASSIUM: 4.2 meq/L (ref 3.5–5.1)
Sodium: 137 mEq/L (ref 136–145)
Total Bilirubin: 0.36 mg/dL (ref 0.20–1.20)
Total Protein: 6.4 g/dL (ref 6.4–8.3)

## 2015-06-02 LAB — URINALYSIS, MICROSCOPIC - CHCC
BILIRUBIN (URINE): NEGATIVE
GLUCOSE UR CHCC: NEGATIVE mg/dL
Ketones: NEGATIVE mg/dL
NITRITE: NEGATIVE
Protein: NEGATIVE mg/dL
Specific Gravity, Urine: 1.01 (ref 1.003–1.035)
Urobilinogen, UR: 0.2 mg/dL (ref 0.2–1)
pH: 6 (ref 4.6–8.0)

## 2015-06-02 LAB — CBC WITH DIFFERENTIAL/PLATELET
BASO%: 0.9 % (ref 0.0–2.0)
BASOS ABS: 0 10*3/uL (ref 0.0–0.1)
EOS%: 2.6 % (ref 0.0–7.0)
Eosinophils Absolute: 0.1 10*3/uL (ref 0.0–0.5)
HCT: 34 % — ABNORMAL LOW (ref 34.8–46.6)
HEMOGLOBIN: 11.3 g/dL — AB (ref 11.6–15.9)
LYMPH#: 0.7 10*3/uL — AB (ref 0.9–3.3)
LYMPH%: 13.8 % — ABNORMAL LOW (ref 14.0–49.7)
MCH: 28.7 pg (ref 25.1–34.0)
MCHC: 33.1 g/dL (ref 31.5–36.0)
MCV: 86.4 fL (ref 79.5–101.0)
MONO#: 0.4 10*3/uL (ref 0.1–0.9)
MONO%: 8.6 % (ref 0.0–14.0)
NEUT#: 3.6 10*3/uL (ref 1.5–6.5)
NEUT%: 74.1 % (ref 38.4–76.8)
Platelets: 322 10*3/uL (ref 145–400)
RBC: 3.93 10*6/uL (ref 3.70–5.45)
RDW: 14.7 % — AB (ref 11.2–14.5)
WBC: 4.9 10*3/uL (ref 3.9–10.3)

## 2015-06-02 MED ORDER — LORAZEPAM 2 MG/ML IJ SOLN
INTRAMUSCULAR | Status: AC
  Filled 2015-06-02: qty 1

## 2015-06-02 MED ORDER — FAMOTIDINE IN NACL 20-0.9 MG/50ML-% IV SOLN
20.0000 mg | Freq: Once | INTRAVENOUS | Status: AC
  Administered 2015-06-02: 20 mg via INTRAVENOUS

## 2015-06-02 MED ORDER — DIPHENHYDRAMINE HCL 50 MG/ML IJ SOLN
INTRAMUSCULAR | Status: AC
  Filled 2015-06-02: qty 1

## 2015-06-02 MED ORDER — SODIUM CHLORIDE 0.9 % IV SOLN
Freq: Once | INTRAVENOUS | Status: AC
  Administered 2015-06-02: 16:00:00 via INTRAVENOUS
  Filled 2015-06-02: qty 8

## 2015-06-02 MED ORDER — DIPHENHYDRAMINE HCL 50 MG/ML IJ SOLN
50.0000 mg | Freq: Once | INTRAMUSCULAR | Status: AC
  Administered 2015-06-02: 50 mg via INTRAVENOUS

## 2015-06-02 MED ORDER — ERYTHROMYCIN 5 MG/GM OP OINT: 1.0000 "application " | TOPICAL_OINTMENT | Freq: Three times a day (TID) | OPHTHALMIC | Status: DC

## 2015-06-02 MED ORDER — CIPROFLOXACIN HCL 500 MG PO TABS: 500.0000 mg | ORAL_TABLET | Freq: Two times a day (BID) | ORAL | Status: DC

## 2015-06-02 MED ORDER — SODIUM CHLORIDE 0.9 % IV SOLN
176.8000 mg | Freq: Once | INTRAVENOUS | Status: AC
  Administered 2015-06-02: 180 mg via INTRAVENOUS
  Filled 2015-06-02: qty 18

## 2015-06-02 MED ORDER — SODIUM CHLORIDE 0.9 % IV SOLN
Freq: Once | INTRAVENOUS | Status: AC
  Administered 2015-06-02: 15:00:00 via INTRAVENOUS

## 2015-06-02 MED ORDER — PACLITAXEL CHEMO INJECTION 300 MG/50ML
45.0000 mg/m2 | Freq: Once | INTRAVENOUS | Status: AC
  Administered 2015-06-02: 90 mg via INTRAVENOUS
  Filled 2015-06-02: qty 15

## 2015-06-02 MED ORDER — FAMOTIDINE IN NACL 20-0.9 MG/50ML-% IV SOLN
INTRAVENOUS | Status: AC
  Filled 2015-06-02: qty 50

## 2015-06-02 MED ORDER — LORAZEPAM 2 MG/ML IJ SOLN
0.5000 mg | Freq: Once | INTRAMUSCULAR | Status: AC
  Administered 2015-06-02: 0.5 mg via INTRAVENOUS

## 2015-06-02 NOTE — Patient Instructions (Signed)
[facility] Discharge Instructions for Patients Receiving Chemotherapy  Today you received the following chemotherapy agents taxol/carboplatin  To help prevent nausea and vomiting after your treatment, we encourage you to take your nausea medication as directed   If you develop nausea and vomiting that is not controlled by your nausea medication, call the clinic.   BELOW ARE SYMPTOMS THAT SHOULD BE REPORTED IMMEDIATELY:  *FEVER GREATER THAN 100.5 F  *CHILLS WITH OR WITHOUT FEVER  NAUSEA AND VOMITING THAT IS NOT CONTROLLED WITH YOUR NAUSEA MEDICATION  *UNUSUAL SHORTNESS OF BREATH  *UNUSUAL BRUISING OR BLEEDING  TENDERNESS IN MOUTH AND THROAT WITH OR WITHOUT PRESENCE OF ULCERS  *URINARY PROBLEMS  *BOWEL PROBLEMS  UNUSUAL RASH Items with * indicate a potential emergency and should be followed up as soon as possible.  Feel free to call the clinic you have any questions or concerns. The clinic phone number is [phone].  

## 2015-06-02 NOTE — Progress Notes (Unsigned)
Oncology Nurse Navigator Documentation  Oncology Nurse Navigator Flowsheets 06/02/2015  Navigator Encounter Type Treatment/I saw daughter and patient going to tx.  Daughter stated her mother was having blurry vision and was weak.  I spoke with Dr. Julien Nordmann.  He stated due to her recent head being negative.  He felt that if was low BP.  He gave an order for fluids.  I updated the chemo nurse regarding bolus.    Treatment Phase Treatment  Interventions Coordination of Care  Coordination of Care Symptom management  Time Spent with Patient 15

## 2015-06-03 ENCOUNTER — Ambulatory Visit
Admission: RE | Admit: 2015-06-03 | Discharge: 2015-06-03 | Disposition: A | Discharge status: Home / Self Care | Payer: Medicare HMO | Source: Ambulatory Visit | Attending: Radiation Oncology | Admitting: Radiation Oncology

## 2015-06-03 DIAGNOSIS — Z510 Encounter for antineoplastic radiation therapy: Secondary | ICD-10-CM | POA: Diagnosis not present

## 2015-06-04 ENCOUNTER — Ambulatory Visit
Admission: RE | Admit: 2015-06-04 | Discharge: 2015-06-04 | Disposition: A | Discharge status: Home / Self Care | Payer: Medicare HMO | Source: Ambulatory Visit | Attending: Radiation Oncology | Admitting: Radiation Oncology

## 2015-06-04 DIAGNOSIS — Z510 Encounter for antineoplastic radiation therapy: Secondary | ICD-10-CM | POA: Diagnosis not present

## 2015-06-04 LAB — URINE CULTURE

## 2015-06-05 ENCOUNTER — Ambulatory Visit
Admission: RE | Admit: 2015-06-05 | Discharge: 2015-06-05 | Disposition: A | Discharge status: Home / Self Care | Payer: Medicare HMO | Source: Ambulatory Visit | Attending: Radiation Oncology | Admitting: Radiation Oncology

## 2015-06-05 ENCOUNTER — Encounter: Payer: Self-pay | Admitting: Radiation Oncology

## 2015-06-05 VITALS — BP 112/55 | HR 77 | Resp 16 | Wt 187.8 lb

## 2015-06-05 DIAGNOSIS — C3411 Malignant neoplasm of upper lobe, right bronchus or lung: Secondary | ICD-10-CM

## 2015-06-05 DIAGNOSIS — Z510 Encounter for antineoplastic radiation therapy: Secondary | ICD-10-CM | POA: Diagnosis not present

## 2015-06-05 NOTE — Progress Notes (Signed)
  Radiation Oncology         [phone]   Name: Pamela Mcguire MRN: [id number]   Date: 06/05/2015  DOB: 08-Apr-1935   Weekly Radiation Therapy Management    ICD-9-CM ICD-10-CM   1. 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  Current Dose: 28 Gy Planned Dose:  66 Gy  Narrative The patient presents for routine under treatment assessment. Vitals stable. Patient reports she hasn't felt well since chemotherapy on Tuesday. The pt states she feels dizzy and reports blurred vision. Reports she was prescribed antibiotic eye drop by Retta Mac, NP without relief. Reports a slight headache. Reports nausea but, denies emesis. Reports using radiaplex as directed. Diagnosed with UTI this week. The pt's daughter reports she was prescribed Cipro. Reports intermittent hearing loss in the right ear. The pt presents to the clinic in a wheelchair. Set-up films were reviewed. The chart was checked.  Physical Findings BP 112/55 mmHg  Pulse 77  Resp 16  Wt 187 lb 12.8 oz (85.186 kg)  Wt Readings from Last 3 Encounters:   06/05/15  187 lb 12.8 oz (85.186 kg)   05/27/15  191 lb 12.8 oz (87 kg)   05/21/15  192 lb 3.2 oz (87.181 kg)  5 lb weight loss noted since late June, 2016. EOMI  Impression The patient is tolerating radiation.  Plan Continue treatment as planned. I advised the pt to continue taking the Cipro and keep herself hydrated. I also advised the pt and her daughter to monitor the pt's symptoms.   This document serves as a record of services personally performed by Tyler Pita, MD. It was created on his behalf by Darcus Austin, a trained medical scribe. The creation of this record is based on the scribe's personal observations and the provider's statements to them. This document has been checked and approved by the attending provider.        Sheral Apley Tammi Klippel, M.D.

## 2015-06-05 NOTE — Progress Notes (Addendum)
Vitals stable. Patient reports she hasn't felt well since chemotherapy on Tuesday. Reports blurred vision. Reports she was prescribed antibiotic eye drop by Retta Mac, NP without relief. Reports a slight headache. Reports nausea but, denies emesis. Radiation dermatitis noted on chest. Reports using radiaplex as directed. Diagnosed with UTI this week. Decline in short term memory and intermittent confusion noted. Reports intermittent hearing loss in right ear. Reports pain in center of chest continues. Denies hemoptysis. Reports a persistent dry cough.  BP 112/55 mmHg  Pulse 77  Resp 16  Wt 187 lb 12.8 oz (85.186 kg) Wt Readings from Last 3 Encounters:  06/05/15 187 lb 12.8 oz (85.186 kg)  05/27/15 191 lb 12.8 oz (87 kg)  05/21/15 192 lb 3.2 oz (87.181 kg)

## 2015-06-06 ENCOUNTER — Encounter: Payer: Self-pay | Admitting: Nurse Practitioner

## 2015-06-06 DIAGNOSIS — N390 Urinary tract infection, site not specified: Secondary | ICD-10-CM | POA: Insufficient documentation

## 2015-06-06 DIAGNOSIS — H109 Unspecified conjunctivitis: Secondary | ICD-10-CM | POA: Insufficient documentation

## 2015-06-06 HISTORY — DX: Urinary tract infection, site not specified: N39.0

## 2015-06-06 NOTE — Assessment & Plan Note (Signed)
Patient presented to the [facility] today to receive her Taxol/coronal chemotherapy.  She also continues to undergo radiation therapy on a daily basis.  Patient is scheduled to return on 06/08/2015 for labs and her next cycle of chemotherapy.

## 2015-06-06 NOTE — Assessment & Plan Note (Signed)
Patient is complaining of urinary frequency; but denies any dysuria, hematuria, or flank pain.  She also denies any recent fevers or chills.  Urinalysis obtained today did reveal a urinary tract infection.  Urine culture results pending.  Will prescribe Cipro antibiotics for treatment of UTI.

## 2015-06-06 NOTE — Assessment & Plan Note (Signed)
Patient is complaining of bilateral eye irritation, redness, and mild increased tearing.  Patient denies any purulent discharge whatsoever.  She also feels that her eyesight has become slightly more blurry as well.  She denies any other neurological deficits whatsoever.  On exam.  It does appear the patient has bilateral scleral erythema; but no excessive tearing or discharge.  Prescribed patient erythromycin ointment for treatment of bilateral mild conjunctivitis.  Also, advised patient to follow back up with her ophthalmologist if her symptoms persist or worsen.  Whatsoever.

## 2015-06-06 NOTE — Progress Notes (Signed)
SYMPTOM MANAGEMENT CLINIC   HPI: Pamela Mcguire 80 y.o. female diagnosed with lung cancer.  Currently undergoing Taxol/carboplatin chemotherapy regimen; as well as radiation therapy.  Patient presents to the [facility] today to receive her Taxol/carboplatin chemotherapy regimen.  She is complaining of some bilateral eye irritation, excessive tearing, and occasional blurred vision.  She denies any purulent discharge from her eyes.  She denies any headaches or other neurological deficits whatsoever.  Most recent brain MRI obtained on 04/29/2015 was negative for any acute findings.  Patient is also reporting some urinary frequency; but denies any dysuria, hematuria, flank pain, or fever/chills.  HPI  ROS  Past Medical History  Diagnosis Date  . Hyperlipidemia   . Diverticulitis   . CAD (coronary artery disease)   . AF (atrial fibrillation)   . Hypertension   . Seasonal allergies   . Prediabetes   . Depression   . Shortness of breath dyspnea   . Anxiety   . Urinary frequency   . GERD (gastroesophageal reflux disease)   . Breast cancer     right mastectomy  . Sleep apnea      dx'd 10/2014, has not received cpap yet (04/28/2015)  . Arthritis     "thighs, hips, arms" (04/28/2015)  . Non-small cell carcinoma of lung, stage 3 04/30/2015    Past Surgical History  Procedure Laterality Date  . Cardioversion    . Tonsillectomy    . Colonoscopy    . Cesarean section  X 2  . Mastectomy Right   . Breast surgery    . Mediastinoscopy N/A 04/28/2015    Procedure: MEDIASTINOSCOPY;  Surgeon: [name], MD;  Location: [facility] OR;  Service: Thoracic;  Laterality: N/A;    has Paroxysmal a-fib; DOE (dyspnea on exertion); HTN (hypertension); Dyslipidemia; Long term current use of antiarrhythmic drug; Bradycardia; Hair loss; Severe obstructive sleep apnea; Lung mass; Mediastinal adenopathy; Sleep apnea; Adenocarcinoma of upper lobe of right lung; Conjunctivitis; and UTI (urinary tract  infection) on her problem list.    has No Known Allergies.    Medication List       This list is accurate as of: 06/02/15 11:59 PM.  Always use your most recent med list.               acetaminophen 500 MG tablet  Commonly known as:  TYLENOL  Take 500-1,000 mg by mouth every 6 (six) hours as needed for mild pain or headache.     albuterol 108 (90 BASE) MCG/ACT inhaler  Commonly known as:  PROVENTIL HFA;VENTOLIN HFA  Inhale 1 puff into the lungs every 6 (six) hours as needed for wheezing or shortness of breath.     ciprofloxacin 500 MG tablet  Commonly known as:  CIPRO  Take 1 tablet (500 mg total) by mouth 2 (two) times daily.     ELIQUIS 5 MG Tabs tablet  Generic drug:  apixaban  TAKE 1 TABLET BY MOUTH TWICE DAILY     erythromycin ophthalmic ointment  Commonly known as:  ROMYCIN  Place 1 application into both eyes 3 (three) times daily.     escitalopram 10 MG tablet  Commonly known as:  LEXAPRO  Take 1 tablet (10 mg total) by mouth daily.     ezetimibe-simvastatin 10-40 MG per tablet  Commonly known as:  VYTORIN  Take 1 tablet by mouth daily.     Fish Oil 1200 MG Caps  Take 2 capsules by mouth daily.     HYDROcodone-acetaminophen 5-325  MG per tablet  Commonly known as:  NORCO/VICODIN  Take 1 tablet by mouth every 6 (six) hours as needed for moderate pain.     loratadine 10 MG tablet  Commonly known as:  CLARITIN  Take 10 mg by mouth at bedtime as needed for allergies.     LORazepam 1 MG tablet  Commonly known as:  ATIVAN  Take 1 tablet (1 mg total) by mouth every 8 (eight) hours.     metoprolol tartrate 25 MG tablet  Commonly known as:  LOPRESSOR  Take 1 tablet (25 mg total) by mouth 2 (two) times daily.     mometasone-formoterol 100-5 MCG/ACT Aero  Commonly known as:  DULERA  Inhale 2 puffs into the lungs 2 (two) times daily as needed for wheezing.     oxyCODONE 5 MG immediate release tablet  Commonly known as:  ROXICODONE  Take 1 tablet (5 mg total)  by mouth every 4 (four) hours as needed for severe pain.     pantoprazole 40 MG tablet  Commonly known as:  PROTONIX  Take 40 mg by mouth daily.     PROBIOTIC PO  Take 1 capsule by mouth 2 (two) times daily.     prochlorperazine 10 MG tablet  Commonly known as:  COMPAZINE  Take 1 tablet (10 mg total) by mouth every 6 (six) hours as needed for nausea or vomiting.     RADIAPLEX EX  Apply topically.     sucralfate 1 G tablet  Commonly known as:  CARAFATE  Take 1 tablet (1 g total) by mouth 4 (four) times daily -  with meals and at bedtime. 5 min before meals for radiation induced esophagitis     TraMADol HCl 100 MG Tb24  Take 100 tablets by mouth once.     traZODone 100 MG tablet  Commonly known as:  DESYREL  Take 1 tablet (100 mg total) by mouth at bedtime.     VITAMIN B-12 PO  Take 1 capsule by mouth every morning.     VITAMIN D PO  Take 1 tablet by mouth every morning.         PHYSICAL EXAMINATION  Oncology Vitals 06/05/2015 06/02/2015 05/27/2015 05/21/2015 05/18/2015 05/13/2015 05/11/2015  Height - - - - - 165 cm -  Weight 85.186 kg - 87 kg 87.181 kg 86.32 kg 86.183 kg -  Weight (lbs) 187 lbs 13 oz - 191 lbs 13 oz 192 lbs 3 oz 190 lbs 5 oz 190 lbs -  BMI (kg/m2) - - - - - 31.62 kg/m2 -  Temp - 98.2 - - 98 - 97.9  Pulse 77 79 70 70 61 63 70  Resp 16 18 16 16 18 16 20  SpO2 - 97 - - 95 97 96  BSA (m2) - - - - - 1.99 m2 -   BP Readings from Last 3 Encounters:  06/05/15 112/55  06/02/15 122/52  05/27/15 145/66    Physical Exam  Constitutional: She is oriented to person, place, and time and well-developed, well-nourished, and in no distress.  HENT:  Head: Normocephalic and atraumatic.  Eyes: EOM are normal. Pupils are equal, round, and reactive to light. Right eye exhibits no discharge. Left eye exhibits no discharge. No scleral icterus.  Neck: Normal range of motion.  Pulmonary/Chest: She is in respiratory distress.  Musculoskeletal: Normal range of motion.    Neurological: She is alert and oriented to person, place, and time.  Skin: Skin is warm and dry.  Psychiatric:   Affect normal.  Nursing note and vitals reviewed.   LABORATORY DATA:. Appointment on 06/02/2015  Component Date Value Ref Range Status  . WBC 06/02/2015 4.9  3.9 - 10.3 10e3/uL Final  . NEUT# 06/02/2015 3.6  1.5 - 6.5 10e3/uL Final  . HGB 06/02/2015 11.3* 11.6 - 15.9 g/dL Final  . HCT 06/02/2015 34.0* 34.8 - 46.6 % Final  . Platelets 06/02/2015 322  145 - 400 10e3/uL Final  . MCV 06/02/2015 86.4  79.5 - 101.0 fL Final  . MCH 06/02/2015 28.7  25.1 - 34.0 pg Final  . MCHC 06/02/2015 33.1  31.5 - 36.0 g/dL Final  . RBC 06/02/2015 3.93  3.70 - 5.45 10e6/uL Final  . RDW 06/02/2015 14.7* 11.2 - 14.5 % Final  . lymph# 06/02/2015 0.7* 0.9 - 3.3 10e3/uL Final  . MONO# 06/02/2015 0.4  0.1 - 0.9 10e3/uL Final  . Eosinophils Absolute 06/02/2015 0.1  0.0 - 0.5 10e3/uL Final  . Basophils Absolute 06/02/2015 0.0  0.0 - 0.1 10e3/uL Final  . NEUT% 06/02/2015 74.1  38.4 - 76.8 % Final  . LYMPH% 06/02/2015 13.8* 14.0 - 49.7 % Final  . MONO% 06/02/2015 8.6  0.0 - 14.0 % Final  . EOS% 06/02/2015 2.6  0.0 - 7.0 % Final  . BASO% 06/02/2015 0.9  0.0 - 2.0 % Final  . Sodium 06/02/2015 137  136 - 145 mEq/L Final  . Potassium 06/02/2015 4.2  3.5 - 5.1 mEq/L Final  . Chloride 06/02/2015 104  98 - 109 mEq/L Final  . CO2 06/02/2015 24  22 - 29 mEq/L Final  . Glucose 06/02/2015 119  70 - 140 mg/dl Final  . BUN 06/02/2015 14.3  7.0 - 26.0 mg/dL Final  . Creatinine 06/02/2015 0.8  0.6 - 1.1 mg/dL Final  . Total Bilirubin 06/02/2015 0.36  0.20 - 1.20 mg/dL Final  . Alkaline Phosphatase 06/02/2015 40  40 - 150 U/L Final  . AST 06/02/2015 12  5 - 34 U/L Final  . ALT 06/02/2015 12  0 - 55 U/L Final  . Total Protein 06/02/2015 6.4  6.4 - 8.3 g/dL Final  . Albumin 06/02/2015 3.1* 3.5 - 5.0 g/dL Final  . Calcium 06/02/2015 9.6  8.4 - 10.4 mg/dL Final  . Anion Gap 06/02/2015 9  3 - 11 mEq/L Final  .  EGFR 06/02/2015 66* >90 ml/min/1.73 m2 Final   eGFR is calculated using the CKD-EPI Creatinine Equation (2009)  . Glucose 06/02/2015 Negative  Negative mg/dL Final  . Bilirubin (Urine) 06/02/2015 Negative  Negative Final  . Ketones 06/02/2015 Negative  Negative mg/dL Final  . Specific Gravity, Urine 06/02/2015 1.010  1.003 - 1.035 Final  . Blood 06/02/2015 Large  Negative Final  . pH 06/02/2015 6.0  4.6 - 8.0 Final  . Protein 06/02/2015 Negative  Negative- <30 mg/dL Final  . Urobilinogen, UR 06/02/2015 0.2  0.2 - 1 mg/dL Final  . Nitrite 06/02/2015 Negative  Negative Final  . Leukocyte Esterase 06/02/2015 Moderate  Negative Final  . RBC / HPF 06/02/2015 7-10  0 - 2 Final  . WBC, UA 06/02/2015 11-20  0 - 2 Final  . Bacteria, UA 06/02/2015 Many  Negative- Trace Final  . Epithelial Cells 06/02/2015 Few  Negative- Few Final  . Urine Culture, Routine 06/02/2015 Culture, Urine   Final   Comment: Final - ===== COLONY COUNT: ===== >=100,000 COLONIES/ML PROTEUS MIRABILIS  ------------------------------------------------------------------------  PROTEUS MIRABILIS     AMPICILLIN  MIC      Sensitive        <=2 ug/ml    AMOX/CLAVULANIC                  MIC      Sensitive          4 ug/ml    AMPICILLIN/SUL                   MIC      Sensitive        <=2 ug/ml    PIPERACILLIN/TAZO                MIC      Sensitive        <=4 ug/ml    IMIPENEM                         MIC      Indeterminate      8 ug/ml    CEFAZOLIN                        MIC      Sensitive          8 ug/ml    CEFTRIAXONE                      MIC      Sensitive        <=1 ug/ml    CEFTAZIDIME                      MIC      Sensitive        <=1 ug/ml    CEFEPIME                         MIC      Sensitive        <=1 ug/ml    GENTAMICIN                       MIC      Sensitive        <=1 ug/ml    TOBRAMYCIN                       MIC      Sensitive        <=1 ug/ml    CIPROFLOXACIN                                               MIC      Sensitive     <=0.25 ug/ml    LEVOFLOXACIN                     MIC      Sensitive     <=0.12 ug/ml    NITROFURANTOIN                   MIC      Resistant        128 ug/ml    TRIMETH/SULFA                    MIC      Sensitive       <=20  ug/ml END OF REPORT      RADIOGRAPHIC STUDIES: No results found.  ASSESSMENT/PLAN:    Conjunctivitis Patient is complaining of bilateral eye irritation, redness, and mild increased tearing.  Patient denies any purulent discharge whatsoever.  She also feels that her eyesight has become slightly more blurry as well.  She denies any other neurological deficits whatsoever.  On exam.  It does appear the patient has bilateral scleral erythema; but no excessive tearing or discharge.  Prescribed patient erythromycin ointment for treatment of bilateral mild conjunctivitis.  Also, advised patient to follow back up with her ophthalmologist if her symptoms persist or worsen.  Whatsoever.  UTI (urinary tract infection) Patient is complaining of urinary frequency; but denies any dysuria, hematuria, or flank pain.  She also denies any recent fevers or chills.  Urinalysis obtained today did reveal a urinary tract infection.  Urine culture results pending.  Will prescribe Cipro antibiotics for treatment of UTI.  Adenocarcinoma of upper lobe of right lung Patient presented to the cancer Center today to receive her Taxol/coronal chemotherapy.  She also continues to undergo radiation therapy on a daily basis.  Patient is scheduled to return on 06/08/2015 for labs and her next cycle of chemotherapy.  Patient stated understanding of all instructions; and was in agreement with this plan of care. The patient knows to call the clinic with any problems, questions or concerns.   Review/collaboration with Dr. Mohamed  regarding all aspects of patient's visit today.   Total time spent with patient was 25 minutes;  with greater than 75 percent of that time  spent in face to face counseling regarding patient's symptoms,  and coordination of care and follow up.  Disclaimer: This note was dictated with voice recognition software. Similar sounding words can inadvertently be transcribed and may not be corrected upon review.   , , NP 06/06/2015    

## 2015-06-08 ENCOUNTER — Ambulatory Visit
Admission: RE | Admit: 2015-06-08 | Discharge: 2015-06-08 | Disposition: A | Discharge status: Home / Self Care | Payer: Medicare HMO | Source: Ambulatory Visit | Attending: Radiation Oncology | Admitting: Radiation Oncology

## 2015-06-08 ENCOUNTER — Other Ambulatory Visit: Payer: Self-pay | Admitting: Internal Medicine

## 2015-06-08 ENCOUNTER — Ambulatory Visit (HOSPITAL_BASED_OUTPATIENT_CLINIC_OR_DEPARTMENT_OTHER): Payer: Medicare HMO

## 2015-06-08 ENCOUNTER — Other Ambulatory Visit (HOSPITAL_BASED_OUTPATIENT_CLINIC_OR_DEPARTMENT_OTHER): Payer: Medicare HMO

## 2015-06-08 VITALS — BP 136/59 | HR 73 | Temp 98.0°F

## 2015-06-08 DIAGNOSIS — C771 Secondary and unspecified malignant neoplasm of intrathoracic lymph nodes: Secondary | ICD-10-CM

## 2015-06-08 DIAGNOSIS — C3491 Malignant neoplasm of unspecified part of right bronchus or lung: Secondary | ICD-10-CM

## 2015-06-08 DIAGNOSIS — Z5111 Encounter for antineoplastic chemotherapy: Secondary | ICD-10-CM

## 2015-06-08 DIAGNOSIS — Z510 Encounter for antineoplastic radiation therapy: Secondary | ICD-10-CM | POA: Diagnosis not present

## 2015-06-08 LAB — COMPREHENSIVE METABOLIC PANEL (CC13)
ALBUMIN: 3.1 g/dL — AB (ref 3.5–5.0)
ALT: 15 U/L (ref 0–55)
AST: 12 U/L (ref 5–34)
Alkaline Phosphatase: 41 U/L (ref 40–150)
Anion Gap: 7 mEq/L (ref 3–11)
BUN: 16.6 mg/dL (ref 7.0–26.0)
CHLORIDE: 104 meq/L (ref 98–109)
CO2: 25 mEq/L (ref 22–29)
CREATININE: 0.8 mg/dL (ref 0.6–1.1)
Calcium: 9.2 mg/dL (ref 8.4–10.4)
EGFR: 66 mL/min/{1.73_m2} — ABNORMAL LOW (ref >90)
GLUCOSE: 138 mg/dL (ref 70–140)
Potassium: 4.2 mEq/L (ref 3.5–5.1)
Sodium: 136 mEq/L (ref 136–145)
Total Bilirubin: 0.48 mg/dL (ref 0.20–1.20)
Total Protein: 6.1 g/dL — ABNORMAL LOW (ref 6.4–8.3)

## 2015-06-08 LAB — CBC WITH DIFFERENTIAL/PLATELET
BASO%: 0.8 % (ref 0.0–2.0)
Basophils Absolute: 0 10*3/uL (ref 0.0–0.1)
EOS%: 1.3 % (ref 0.0–7.0)
Eosinophils Absolute: 0 10*3/uL (ref 0.0–0.5)
HCT: 34.3 % — ABNORMAL LOW (ref 34.8–46.6)
HEMOGLOBIN: 11.3 g/dL — AB (ref 11.6–15.9)
LYMPH#: 0.5 10*3/uL — AB (ref 0.9–3.3)
LYMPH%: 14.4 % (ref 14.0–49.7)
MCH: 28.4 pg (ref 25.1–34.0)
MCHC: 32.9 g/dL (ref 31.5–36.0)
MCV: 86.3 fL (ref 79.5–101.0)
MONO#: 0.2 10*3/uL (ref 0.1–0.9)
MONO%: 4.6 % (ref 0.0–14.0)
NEUT#: 2.9 10*3/uL (ref 1.5–6.5)
NEUT%: 78.9 % — ABNORMAL HIGH (ref 38.4–76.8)
Platelets: 258 10*3/uL (ref 145–400)
RBC: 3.98 10*6/uL (ref 3.70–5.45)
RDW: 14.7 % — ABNORMAL HIGH (ref 11.2–14.5)
WBC: 3.7 10*3/uL — ABNORMAL LOW (ref 3.9–10.3)

## 2015-06-08 MED ORDER — DIPHENHYDRAMINE HCL 50 MG/ML IJ SOLN
INTRAMUSCULAR | Status: AC
  Filled 2015-06-08: qty 1

## 2015-06-08 MED ORDER — FAMOTIDINE IN NACL 20-0.9 MG/50ML-% IV SOLN
INTRAVENOUS | Status: AC
  Filled 2015-06-08: qty 50

## 2015-06-08 MED ORDER — DIPHENHYDRAMINE HCL 50 MG/ML IJ SOLN
50.0000 mg | Freq: Once | INTRAMUSCULAR | Status: AC
Start: 2015-06-08 — End: 2015-06-08
  Administered 2015-06-08: 25 mg via INTRAVENOUS

## 2015-06-08 MED ORDER — SODIUM CHLORIDE 0.9 % IV SOLN
Freq: Once | INTRAVENOUS | Status: AC
  Administered 2015-06-08: 12:00:00 via INTRAVENOUS
  Filled 2015-06-08: qty 8

## 2015-06-08 MED ORDER — SODIUM CHLORIDE 0.9 % IV SOLN
176.8000 mg | Freq: Once | INTRAVENOUS | Status: AC
  Administered 2015-06-08: 180 mg via INTRAVENOUS
  Filled 2015-06-08: qty 18

## 2015-06-08 MED ORDER — SODIUM CHLORIDE 0.9 % IV SOLN
Freq: Once | INTRAVENOUS | Status: AC
  Administered 2015-06-08: 11:00:00 via INTRAVENOUS

## 2015-06-08 MED ORDER — PACLITAXEL CHEMO INJECTION 300 MG/50ML
45.0000 mg/m2 | Freq: Once | INTRAVENOUS | Status: AC
  Administered 2015-06-08: 90 mg via INTRAVENOUS
  Filled 2015-06-08: qty 15

## 2015-06-08 MED ORDER — FAMOTIDINE IN NACL 20-0.9 MG/50ML-% IV SOLN
20.0000 mg | Freq: Once | INTRAVENOUS | Status: AC
  Administered 2015-06-08: 20 mg via INTRAVENOUS

## 2015-06-08 NOTE — Patient Instructions (Addendum)
[facility] Discharge Instructions for Patients Receiving Chemotherapy  Today you received the following chemotherapy agents taxol/carboplatin  To help prevent nausea and vomiting after your treatment, we encourage you to take your nausea medication as directed   If you develop nausea and vomiting that is not controlled by your nausea medication, call the clinic.   BELOW ARE SYMPTOMS THAT SHOULD BE REPORTED IMMEDIATELY:  *FEVER GREATER THAN 100.5 F  *CHILLS WITH OR WITHOUT FEVER  NAUSEA AND VOMITING THAT IS NOT CONTROLLED WITH YOUR NAUSEA MEDICATION  *UNUSUAL SHORTNESS OF BREATH  *UNUSUAL BRUISING OR BLEEDING  TENDERNESS IN MOUTH AND THROAT WITH OR WITHOUT PRESENCE OF ULCERS  *URINARY PROBLEMS  *BOWEL PROBLEMS  UNUSUAL RASH Items with * indicate a potential emergency and should be followed up as soon as possible.  Feel free to call the clinic you have any questions or concerns. The clinic phone number is [phone].   Paclitaxel injection What is this medicine? PACLITAXEL (PAK li TAX el) is a chemotherapy drug. It targets fast dividing cells, like cancer cells, and causes these cells to die. This medicine is used to treat ovarian cancer, breast cancer, and other cancers. This medicine may be used for other purposes; ask your health care provider or pharmacist if you have questions. COMMON BRAND NAME(S): Onxol, Taxol What should I tell my health care provider before I take this medicine? They need to know if you have any of these conditions: -blood disorders -irregular heartbeat -infection (especially a virus infection such as chickenpox, cold sores, or herpes) -liver disease -previous or ongoing radiation therapy -an unusual or allergic reaction to paclitaxel, alcohol, polyoxyethylated castor oil, other chemotherapy agents, other medicines, foods, dyes, or preservatives -pregnant or trying to get pregnant -breast-feeding How should I use this  medicine? This drug is given as an infusion into a vein. It is administered in a hospital or clinic by a specially trained health care professional. Talk to your pediatrician regarding the use of this medicine in children. Special care may be needed. Overdosage: If you think you have taken too much of this medicine contact a poison control center or emergency room at once. NOTE: This medicine is only for you. Do not share this medicine with others. What if I miss a dose? It is important not to miss your dose. Call your doctor or health care professional if you are unable to keep an appointment. What may interact with this medicine? Do not take this medicine with any of the following medications: -disulfiram -metronidazole This medicine may also interact with the following medications: -cyclosporine -diazepam -ketoconazole -medicines to increase blood counts like filgrastim, pegfilgrastim, sargramostim -other chemotherapy drugs like cisplatin, doxorubicin, epirubicin, etoposide, teniposide, vincristine -quinidine -testosterone -vaccines -verapamil Talk to your doctor or health care professional before taking any of these medicines: -acetaminophen -aspirin -ibuprofen -ketoprofen -naproxen This list may not describe all possible interactions. Give your health care provider a list of all the medicines, herbs, non-prescription drugs, or dietary supplements you use. Also tell them if you smoke, drink alcohol, or use illegal drugs. Some items may interact with your medicine. What should I watch for while using this medicine? Your condition will be monitored carefully while you are receiving this medicine. You will need important blood work done while you are taking this medicine. This drug may make you feel generally unwell. This is not uncommon, as chemotherapy can affect healthy cells as well as cancer cells. Report any side effects. Continue your course of treatment   even though you feel ill  unless your doctor tells you to stop. In some cases, you may be given additional medicines to help with side effects. Follow all directions for their use. Call your doctor or health care professional for advice if you get a fever, chills or sore throat, or other symptoms of a cold or flu. Do not treat yourself. This drug decreases your body's ability to fight infections. Try to avoid being around people who are sick. This medicine may increase your risk to bruise or bleed. Call your doctor or health care professional if you notice any unusual bleeding. Be careful brushing and flossing your teeth or using a toothpick because you may get an infection or bleed more easily. If you have any dental work done, tell your dentist you are receiving this medicine. Avoid taking products that contain aspirin, acetaminophen, ibuprofen, naproxen, or ketoprofen unless instructed by your doctor. These medicines may hide a fever. Do not become pregnant while taking this medicine. Women should inform their doctor if they wish to become pregnant or think they might be pregnant. There is a potential for serious side effects to an unborn child. Talk to your health care professional or pharmacist for more information. Do not breast-feed an infant while taking this medicine. Men are advised not to father a child while receiving this medicine. What side effects may I notice from receiving this medicine? Side effects that you should report to your doctor or health care professional as soon as possible: -allergic reactions like skin rash, itching or hives, swelling of the face, lips, or tongue -low blood counts - This drug may decrease the number of white blood cells, red blood cells and platelets. You may be at increased risk for infections and bleeding. -signs of infection - fever or chills, cough, sore throat, pain or difficulty passing urine -signs of decreased platelets or bleeding - bruising, pinpoint red spots on the skin,  black, tarry stools, nosebleeds -signs of decreased red blood cells - unusually weak or tired, fainting spells, lightheadedness -breathing problems -chest pain -high or low blood pressure -mouth sores -nausea and vomiting -pain, swelling, redness or irritation at the injection site -pain, tingling, numbness in the hands or feet -slow or irregular heartbeat -swelling of the ankle, feet, hands Side effects that usually do not require medical attention (report to your doctor or health care professional if they continue or are bothersome): -bone pain -complete hair loss including hair on your head, underarms, pubic hair, eyebrows, and eyelashes -changes in the color of fingernails -diarrhea -loosening of the fingernails -loss of appetite -muscle or joint pain -red flush to skin -sweating This list may not describe all possible side effects. Call your doctor for medical advice about side effects. You may report side effects to FDA at 1-800-FDA-1088. Where should I keep my medicine? This drug is given in a hospital or clinic and will not be stored at home. NOTE: This sheet is a summary. It may not cover all possible information. If you have questions about this medicine, talk to your doctor, pharmacist, or health care provider.  2015, [employer]/Gold Standard. (2013-01-07 16:41:21)  Carboplatin injection What is this medicine? CARBOPLATIN (KAR boe pla tin) is a chemotherapy drug. It targets fast dividing cells, like cancer cells, and causes these cells to die. This medicine is used to treat ovarian cancer and many other cancers. This medicine may be used for other purposes; ask your health care provider or pharmacist if you have questions. COMMON BRAND   NAME(S): Paraplatin What should I tell my health care provider before I take this medicine? They need to know if you have any of these conditions: -blood disorders -hearing problems -kidney disease -recent or ongoing radiation  therapy -an unusual or allergic reaction to carboplatin, cisplatin, other chemotherapy, other medicines, foods, dyes, or preservatives -pregnant or trying to get pregnant -breast-feeding How should I use this medicine? This drug is usually given as an infusion into a vein. It is administered in a hospital or clinic by a specially trained health care professional. Talk to your pediatrician regarding the use of this medicine in children. Special care may be needed. Overdosage: If you think you have taken too much of this medicine contact a poison control center or emergency room at once. NOTE: This medicine is only for you. Do not share this medicine with others. What if I miss a dose? It is important not to miss a dose. Call your doctor or health care professional if you are unable to keep an appointment. What may interact with this medicine? -medicines for seizures -medicines to increase blood counts like filgrastim, pegfilgrastim, sargramostim -some antibiotics like amikacin, gentamicin, neomycin, streptomycin, tobramycin -vaccines Talk to your doctor or health care professional before taking any of these medicines: -acetaminophen -aspirin -ibuprofen -ketoprofen -naproxen This list may not describe all possible interactions. Give your health care provider a list of all the medicines, herbs, non-prescription drugs, or dietary supplements you use. Also tell them if you smoke, drink alcohol, or use illegal drugs. Some items may interact with your medicine. What should I watch for while using this medicine? Your condition will be monitored carefully while you are receiving this medicine. You will need important blood work done while you are taking this medicine. This drug may make you feel generally unwell. This is not uncommon, as chemotherapy can affect healthy cells as well as cancer cells. Report any side effects. Continue your course of treatment even though you feel ill unless your doctor  tells you to stop. In some cases, you may be given additional medicines to help with side effects. Follow all directions for their use. Call your doctor or health care professional for advice if you get a fever, chills or sore throat, or other symptoms of a cold or flu. Do not treat yourself. This drug decreases your body's ability to fight infections. Try to avoid being around people who are sick. This medicine may increase your risk to bruise or bleed. Call your doctor or health care professional if you notice any unusual bleeding. Be careful brushing and flossing your teeth or using a toothpick because you may get an infection or bleed more easily. If you have any dental work done, tell your dentist you are receiving this medicine. Avoid taking products that contain aspirin, acetaminophen, ibuprofen, naproxen, or ketoprofen unless instructed by your doctor. These medicines may hide a fever. Do not become pregnant while taking this medicine. Women should inform their doctor if they wish to become pregnant or think they might be pregnant. There is a potential for serious side effects to an unborn child. Talk to your health care professional or pharmacist for more information. Do not breast-feed an infant while taking this medicine. What side effects may I notice from receiving this medicine? Side effects that you should report to your doctor or health care professional as soon as possible: -allergic reactions like skin rash, itching or hives, swelling of the face, lips, or tongue -signs of infection - fever or   chills, cough, sore throat, pain or difficulty passing urine -signs of decreased platelets or bleeding - bruising, pinpoint red spots on the skin, black, tarry stools, nosebleeds -signs of decreased red blood cells - unusually weak or tired, fainting spells, lightheadedness -breathing problems -changes in hearing -changes in vision -chest pain -high blood pressure -low blood counts - This  drug may decrease the number of white blood cells, red blood cells and platelets. You may be at increased risk for infections and bleeding. -nausea and vomiting -pain, swelling, redness or irritation at the injection site -pain, tingling, numbness in the hands or feet -problems with balance, talking, walking -trouble passing urine or change in the amount of urine Side effects that usually do not require medical attention (report to your doctor or health care professional if they continue or are bothersome): -hair loss -loss of appetite -metallic taste in the mouth or changes in taste This list may not describe all possible side effects. Call your doctor for medical advice about side effects. You may report side effects to FDA at 1-800-FDA-1088. Where should I keep my medicine? This drug is given in a hospital or clinic and will not be stored at home. NOTE: This sheet is a summary. It may not cover all possible information. If you have questions about this medicine, talk to your doctor, pharmacist, or health care provider.  2015, [employer]/Gold Standard. (2008-02-19 14:38:05)   

## 2015-06-09 ENCOUNTER — Ambulatory Visit
Admission: RE | Admit: 2015-06-09 | Discharge: 2015-06-09 | Disposition: A | Discharge status: Home / Self Care | Payer: Medicare HMO | Source: Ambulatory Visit | Attending: Radiation Oncology | Admitting: Radiation Oncology

## 2015-06-09 DIAGNOSIS — Z510 Encounter for antineoplastic radiation therapy: Secondary | ICD-10-CM | POA: Diagnosis not present

## 2015-06-09 DIAGNOSIS — C3411 Malignant neoplasm of upper lobe, right bronchus or lung: Secondary | ICD-10-CM

## 2015-06-09 MED ORDER — BIAFINE EX EMUL
Freq: Every day | CUTANEOUS | Status: DC
  Administered 2015-06-09: 15:00:00 via TOPICAL

## 2015-06-09 MED ORDER — EMOLLIENT BASE EX CREA: TOPICAL_CREAM | CUTANEOUS | Status: DC | PRN

## 2015-06-09 NOTE — Progress Notes (Signed)
Pamela Mcguire in nursing today after treatment with a mild rash in her treatment field. Discovered, per her daughter that she was using a lotion that was not approved despite having Radiaplex lotion.  Reviewed with daughter that she needed to use recommendation lotion to her upper chest and back and to avoid irritation of her skin during her treatment phase.  Advised that if lotion applied prior to treatment, that ii has to be applied 4 hours before she arrives to our department, then can reapply in the evening.  Daughter stated understanding.  Pamela Mcguire has difficulty with retaining information.

## 2015-06-10 ENCOUNTER — Ambulatory Visit
Admission: RE | Admit: 2015-06-10 | Discharge: 2015-06-10 | Disposition: A | Discharge status: Home / Self Care | Payer: Medicare HMO | Source: Ambulatory Visit | Attending: Radiation Oncology | Admitting: Radiation Oncology

## 2015-06-10 DIAGNOSIS — Z510 Encounter for antineoplastic radiation therapy: Secondary | ICD-10-CM | POA: Diagnosis not present

## 2015-06-11 ENCOUNTER — Ambulatory Visit
Admission: RE | Admit: 2015-06-11 | Discharge: 2015-06-11 | Disposition: A | Discharge status: Home / Self Care | Payer: Medicare HMO | Source: Ambulatory Visit | Attending: Radiation Oncology | Admitting: Radiation Oncology

## 2015-06-11 DIAGNOSIS — Z510 Encounter for antineoplastic radiation therapy: Secondary | ICD-10-CM | POA: Diagnosis not present

## 2015-06-12 ENCOUNTER — Encounter: Payer: Self-pay | Admitting: Radiation Oncology

## 2015-06-12 ENCOUNTER — Ambulatory Visit
Admission: RE | Admit: 2015-06-12 | Discharge: 2015-06-12 | Disposition: A | Discharge status: Home / Self Care | Payer: Medicare HMO | Source: Ambulatory Visit | Attending: Radiation Oncology | Admitting: Radiation Oncology

## 2015-06-12 VITALS — BP 116/50 | HR 78 | Temp 98.5°F | Resp 16 | Ht 65.0 in | Wt 185.5 lb

## 2015-06-12 DIAGNOSIS — C3411 Malignant neoplasm of upper lobe, right bronchus or lung: Secondary | ICD-10-CM

## 2015-06-12 DIAGNOSIS — Z510 Encounter for antineoplastic radiation therapy: Secondary | ICD-10-CM | POA: Diagnosis not present

## 2015-06-12 NOTE — Progress Notes (Addendum)
Pamela Mcguire has completed 19 fractions to her right lung.  She is in a wheelchair.  She continues to have pain in her central chest that she is rating at a 8/10.  She takes hydrocodone/acetaminophen at night.  She reports pain with swallowing and is taking Carafate.  She reports a poor appetite.  She has lost 2 lbs since last week.  She reports blurry vision that started about 2 weeks ago.  Her daughter reports that she is walking in to walls.  She is taking eye drops for this which are not helping.  She had chemotherapy on Monday.  She reports feeling dizzy.  Her diastolic BP was low today at 50.  She reports shortness of breath with activity.  She denies a cough or hemoptysis.  The skin on her right central chest and upper back has redness.  She is using biafine cream.  She reports fatigue.    BP 116/50 mmHg  Pulse 78  Temp(Src) 98.5 F (36.9 C) (Oral)  Resp 16  Ht '5\' 5"'$  (1.651 m)  Wt 185 lb 8 oz (84.142 kg)  BMI 30.87 kg/m2  SpO2 100%

## 2015-06-12 NOTE — Progress Notes (Signed)
  Radiation Oncology         [phone]   Name: Pamela Mcguire MRN: [id number]   Date: 06/12/2015  DOB: 1935-10-20   Weekly Radiation Therapy Management    ICD-9-CM ICD-10-CM   1. Adenocarcinoma of upper lobe of right lung 162.3 C34.11     Current Dose: 38 Gy  Planned Dose:  66 Gy  Narrative The patient presents for routine under treatment assessment. Pamela Mcguire has completed 19 fractions to her right lung.  She is in a wheelchair.  She continues to have pain in her central chest that she is rating at a 8/10.  She takes hydrocodone/acetaminophen at night.  She reports pain with swallowing and is taking Carafate.  She reports a poor appetite.  She has lost 2 lbs since last week.  She reports blurry vision that started about 2 weeks ago.  Her daughter reports that she is walking in to walls.  She is taking eye drops for this which are not helping.  She had chemotherapy on Monday.  She reports feeling dizzy.  Her diastolic BP was low today at 50.  She reports shortness of breath with activity.  She denies a cough or hemoptysis.  The skin on her right central chest and upper back has redness.  She is using biafine cream.  She reports fatigue.  The patient is without complaint. Set-up films were reviewed. The chart was checked.  Physical Findings  height is '5\' 5"'$  (1.651 m) and weight is 185 lb 8 oz (84.142 kg). Her oral temperature is 98.5 F (36.9 C). Her blood pressure is 116/50 and her pulse is 78. Her respiration is 16 and oxygen saturation is 100%. . Weight essentially stable.  No significant changes.  Impression The patient is tolerating radiation.  Plan Continue treatment as planned.  Will push hydration and nutrition.  If issues persist, will consider MRI brain with contrast.     Sheral Apley. Tammi Klippel, M.D.

## 2015-06-15 ENCOUNTER — Ambulatory Visit
Admission: RE | Admit: 2015-06-15 | Discharge: 2015-06-15 | Disposition: A | Discharge status: Home / Self Care | Payer: Medicare HMO | Source: Ambulatory Visit | Attending: Radiation Oncology | Admitting: Radiation Oncology

## 2015-06-15 ENCOUNTER — Other Ambulatory Visit: Payer: Self-pay | Admitting: Medical Oncology

## 2015-06-15 ENCOUNTER — Ambulatory Visit (HOSPITAL_BASED_OUTPATIENT_CLINIC_OR_DEPARTMENT_OTHER): Payer: Medicare HMO

## 2015-06-15 ENCOUNTER — Telehealth: Payer: Self-pay | Admitting: *Deleted

## 2015-06-15 ENCOUNTER — Ambulatory Visit (HOSPITAL_BASED_OUTPATIENT_CLINIC_OR_DEPARTMENT_OTHER): Payer: Medicare HMO | Admitting: Physician Assistant

## 2015-06-15 ENCOUNTER — Other Ambulatory Visit (HOSPITAL_BASED_OUTPATIENT_CLINIC_OR_DEPARTMENT_OTHER): Payer: Medicare HMO

## 2015-06-15 ENCOUNTER — Telehealth: Payer: Self-pay | Admitting: Physician Assistant

## 2015-06-15 ENCOUNTER — Encounter: Payer: Self-pay | Admitting: Physician Assistant

## 2015-06-15 VITALS — BP 127/65 | HR 84 | Temp 98.2°F | Resp 18 | Ht 65.0 in | Wt 183.7 lb

## 2015-06-15 DIAGNOSIS — Z5111 Encounter for antineoplastic chemotherapy: Secondary | ICD-10-CM | POA: Diagnosis not present

## 2015-06-15 DIAGNOSIS — R5383 Other fatigue: Secondary | ICD-10-CM

## 2015-06-15 DIAGNOSIS — C3491 Malignant neoplasm of unspecified part of right bronchus or lung: Secondary | ICD-10-CM

## 2015-06-15 DIAGNOSIS — C771 Secondary and unspecified malignant neoplasm of intrathoracic lymph nodes: Secondary | ICD-10-CM

## 2015-06-15 DIAGNOSIS — Z510 Encounter for antineoplastic radiation therapy: Secondary | ICD-10-CM | POA: Diagnosis not present

## 2015-06-15 DIAGNOSIS — R1310 Dysphagia, unspecified: Secondary | ICD-10-CM

## 2015-06-15 DIAGNOSIS — G2581 Restless legs syndrome: Secondary | ICD-10-CM

## 2015-06-15 DIAGNOSIS — C3411 Malignant neoplasm of upper lobe, right bronchus or lung: Secondary | ICD-10-CM

## 2015-06-15 LAB — CBC WITH DIFFERENTIAL/PLATELET
BASO%: 0.6 % (ref 0.0–2.0)
Basophils Absolute: 0 10*3/uL (ref 0.0–0.1)
EOS%: 1.3 % (ref 0.0–7.0)
Eosinophils Absolute: 0.1 10*3/uL (ref 0.0–0.5)
HEMATOCRIT: 35.7 % (ref 34.8–46.6)
HGB: 11.7 g/dL (ref 11.6–15.9)
LYMPH%: 10 % — ABNORMAL LOW (ref 14.0–49.7)
MCH: 28.3 pg (ref 25.1–34.0)
MCHC: 32.7 g/dL (ref 31.5–36.0)
MCV: 86.4 fL (ref 79.5–101.0)
MONO#: 0.2 10*3/uL (ref 0.1–0.9)
MONO%: 5.4 % (ref 0.0–14.0)
NEUT#: 3.6 10*3/uL (ref 1.5–6.5)
NEUT%: 82.7 % — ABNORMAL HIGH (ref 38.4–76.8)
PLATELETS: 194 10*3/uL (ref 145–400)
RBC: 4.14 10*6/uL (ref 3.70–5.45)
RDW: 14.7 % — AB (ref 11.2–14.5)
WBC: 4.3 10*3/uL (ref 3.9–10.3)
lymph#: 0.4 10*3/uL — ABNORMAL LOW (ref 0.9–3.3)

## 2015-06-15 LAB — COMPREHENSIVE METABOLIC PANEL (CC13)
ALBUMIN: 3.1 g/dL — AB (ref 3.5–5.0)
ALT: 18 U/L (ref 0–55)
AST: 16 U/L (ref 5–34)
Alkaline Phosphatase: 38 U/L — ABNORMAL LOW (ref 40–150)
Anion Gap: 7 mEq/L (ref 3–11)
BUN: 12 mg/dL (ref 7.0–26.0)
CO2: 24 mEq/L (ref 22–29)
Calcium: 9.3 mg/dL (ref 8.4–10.4)
Chloride: 106 mEq/L (ref 98–109)
Creatinine: 0.8 mg/dL (ref 0.6–1.1)
EGFR: 75 mL/min/{1.73_m2} — ABNORMAL LOW (ref >90)
Glucose: 124 mg/dl (ref 70–140)
POTASSIUM: 4.2 meq/L (ref 3.5–5.1)
SODIUM: 137 meq/L (ref 136–145)
Total Bilirubin: 0.41 mg/dL (ref 0.20–1.20)
Total Protein: 6.2 g/dL — ABNORMAL LOW (ref 6.4–8.3)

## 2015-06-15 MED ORDER — LORAZEPAM 2 MG/ML IJ SOLN
0.5000 mg | INTRAMUSCULAR | Status: AC
  Administered 2015-06-15: 0.5 mg via INTRAVENOUS

## 2015-06-15 MED ORDER — SODIUM CHLORIDE 0.9 % IV SOLN
Freq: Once | INTRAVENOUS | Status: AC
  Administered 2015-06-15: 15:00:00 via INTRAVENOUS

## 2015-06-15 MED ORDER — DIPHENHYDRAMINE HCL 50 MG/ML IJ SOLN
25.0000 mg | Freq: Once | INTRAMUSCULAR | Status: AC
  Administered 2015-06-15: 25 mg via INTRAVENOUS

## 2015-06-15 MED ORDER — FAMOTIDINE IN NACL 20-0.9 MG/50ML-% IV SOLN
INTRAVENOUS | Status: AC
  Filled 2015-06-15: qty 50

## 2015-06-15 MED ORDER — PACLITAXEL CHEMO INJECTION 300 MG/50ML
45.0000 mg/m2 | Freq: Once | INTRAVENOUS | Status: AC
  Administered 2015-06-15: 90 mg via INTRAVENOUS
  Filled 2015-06-15: qty 15

## 2015-06-15 MED ORDER — DIPHENHYDRAMINE HCL 50 MG/ML IJ SOLN
INTRAMUSCULAR | Status: AC
Start: 2015-06-15 — End: 2015-06-15
  Filled 2015-06-15: qty 1

## 2015-06-15 MED ORDER — LORAZEPAM 2 MG/ML IJ SOLN
INTRAMUSCULAR | Status: AC
  Filled 2015-06-15: qty 1

## 2015-06-15 MED ORDER — SODIUM CHLORIDE 0.9 % IV SOLN
Freq: Once | INTRAVENOUS | Status: AC
  Administered 2015-06-15: 15:00:00 via INTRAVENOUS
  Filled 2015-06-15: qty 8

## 2015-06-15 MED ORDER — FAMOTIDINE IN NACL 20-0.9 MG/50ML-% IV SOLN
20.0000 mg | Freq: Once | INTRAVENOUS | Status: AC
  Administered 2015-06-15: 20 mg via INTRAVENOUS

## 2015-06-15 MED ORDER — SODIUM CHLORIDE 0.9 % IV SOLN
176.8000 mg | Freq: Once | INTRAVENOUS | Status: AC
  Administered 2015-06-15: 180 mg via INTRAVENOUS
  Filled 2015-06-15: qty 18

## 2015-06-15 NOTE — Telephone Encounter (Signed)
Per staff message and POF I have scheduled appts. Advised scheduler of appts. JMW  

## 2015-06-15 NOTE — Patient Instructions (Signed)
[facility] Discharge Instructions for Patients Receiving Chemotherapy  Today you received the following chemotherapy agents taxol/carboplatin  To help prevent nausea and vomiting after your treatment, we encourage you to take your nausea medication as directed   If you develop nausea and vomiting that is not controlled by your nausea medication, call the clinic.   BELOW ARE SYMPTOMS THAT SHOULD BE REPORTED IMMEDIATELY:  *FEVER GREATER THAN 100.5 F  *CHILLS WITH OR WITHOUT FEVER  NAUSEA AND VOMITING THAT IS NOT CONTROLLED WITH YOUR NAUSEA MEDICATION  *UNUSUAL SHORTNESS OF BREATH  *UNUSUAL BRUISING OR BLEEDING  TENDERNESS IN MOUTH AND THROAT WITH OR WITHOUT PRESENCE OF ULCERS  *URINARY PROBLEMS  *BOWEL PROBLEMS  UNUSUAL RASH Items with * indicate a potential emergency and should be followed up as soon as possible.  Feel free to call the clinic you have any questions or concerns. The clinic phone number is [phone].  

## 2015-06-15 NOTE — Patient Instructions (Signed)
Continue with your course of concurrent chemoradiation as scheduled Followup in 2 weeks 

## 2015-06-15 NOTE — Progress Notes (Addendum)
No images are attached to the encounter. No scans are attached to the encounter. No scans are attached to the encounter. Pamela Mcguire VISIT PROGRESS NOTE  [name], [place] [address]  DIAGNOSIS: Non-small cell carcinoma of lung, stage 3   Staging form: Lung, AJCC 7th Edition     Clinical stage from 04/30/2015: Stage IIIA (T1a, N2, M0) - Signed by Curt Bears, MD on 04/30/2015  PRIOR THERAPY: None  CURRENT THERAPY: Concurrent chemoradiation with chemotherapy in the form of weekly carboplatin for an AUC of 2 and paclitaxel at 45 mg/m given concurrent with radiation.  INTERVAL HISTORY: Pamela Mcguire 80 y.o. female returns for a scheduled regular office visit for followup of her recently diagnosed stage IIIa non-small cell lung cancer. She is currently undergoing a course of concurrent chemoradiation. She reports having some discomfort with swallowing is currently on Carafate. She is experiencing some fatigue but is otherwise tolerating her course of concurrent chemoradiation relatively well.  She is accompanied by one of her daughters.   She continues on oxygen at 2 L via nasal cannula. She voices no specific complaints today.  MEDICAL HISTORY: Past Medical History  Diagnosis Date  . Hyperlipidemia   . Diverticulitis   . CAD (coronary artery disease)   . AF (atrial fibrillation)   . Hypertension   . Seasonal allergies   . Prediabetes   . Depression   . Shortness of breath dyspnea   . Anxiety   . Urinary frequency   . GERD (gastroesophageal reflux disease)   . Breast cancer     right mastectomy  . Sleep apnea      dx'd 10/2014, has not received cpap yet (04/28/2015)  . Arthritis     "thighs, hips, arms" (04/28/2015)  . Non-small cell carcinoma of lung, stage 3 04/30/2015    ALLERGIES:  has No Known Allergies.  MEDICATIONS:  Current Outpatient Prescriptions  Medication Sig Dispense Refill  .  acetaminophen (TYLENOL) 500 MG tablet Take 500-1,000 mg by mouth every 6 (six) hours as needed for mild pain or headache.    . albuterol (PROVENTIL HFA;VENTOLIN HFA) 108 (90 BASE) MCG/ACT inhaler Inhale 1 puff into the lungs every 6 (six) hours as needed for wheezing or shortness of breath.    . Cholecalciferol (VITAMIN D PO) Take 1 tablet by mouth every morning.     . Cyanocobalamin (VITAMIN B-12 PO) Take 1 capsule by mouth every morning.    Marland Kitchen ELIQUIS 5 MG TABS tablet TAKE 1 TABLET BY MOUTH TWICE DAILY 60 tablet 5  . emollient (BIAFINE) cream Apply topically as needed. 454 g 0  . erythromycin (ROMYCIN) ophthalmic ointment Place 1 application into both eyes 3 (three) times daily. 3.5 g 0  . escitalopram (LEXAPRO) 10 MG tablet Take 1 tablet (10 mg total) by mouth daily. 30 tablet 5  . ezetimibe-simvastatin (VYTORIN) 10-40 MG per tablet Take 1 tablet by mouth daily. 90 tablet 3  . HYDROcodone-acetaminophen (NORCO/VICODIN) 5-325 MG per tablet Take 1 tablet by mouth every 6 (six) hours as needed for moderate pain. 30 tablet 0  . LORazepam (ATIVAN) 1 MG tablet Take 1 tablet (1 mg total) by mouth every 8 (eight) hours. 45 tablet 0  . metoprolol tartrate (LOPRESSOR) 25 MG tablet Take 1 tablet (25 mg total) by mouth 2 (two) times daily. 180 tablet 2  . mometasone-formoterol (DULERA) 100-5 MCG/ACT AERO Inhale 2 puffs into the lungs 2 (two) times daily  as needed for wheezing.     . Omega-3 Fatty Acids (FISH OIL) 1200 MG CAPS Take 2 capsules by mouth daily.    . pantoprazole (PROTONIX) 40 MG tablet Take 40 mg by mouth daily.    . Probiotic Product (PROBIOTIC PO) Take 1 capsule by mouth 2 (two) times daily.    . prochlorperazine (COMPAZINE) 10 MG tablet Take 1 tablet (10 mg total) by mouth every 6 (six) hours as needed for nausea or vomiting. 30 tablet 0  . sucralfate (CARAFATE) 1 G tablet Take 1 tablet (1 g total) by mouth 4 (four) times daily -  with meals and at bedtime. 5 min before meals for radiation  induced esophagitis 120 tablet 2  . traZODone (DESYREL) 100 MG tablet Take 1 tablet (100 mg total) by mouth at bedtime. 30 tablet 0  . Wound Cleansers (RADIAPLEX EX) Apply topically.    Marland Kitchen loratadine (CLARITIN) 10 MG tablet Take 10 mg by mouth at bedtime as needed for allergies.    Marland Kitchen oxyCODONE (ROXICODONE) 5 MG immediate release tablet Take 1 tablet (5 mg total) by mouth every 4 (four) hours as needed for severe pain. (Patient not taking: Reported on 06/12/2015) 10 tablet 0   No current facility-administered medications for this visit.   Facility-Administered Medications Ordered in Other Visits  Medication Dose Route Frequency Provider Last Rate Last Dose  . CARBOplatin (PARAPLATIN) 180 mg in sodium chloride 0.9 % 100 mL chemo infusion  180 mg Intravenous Once Curt Bears, MD        SURGICAL HISTORY:  Past Surgical History  Procedure Laterality Date  . Cardioversion    . Tonsillectomy    . Colonoscopy    . Cesarean section  X 2  . Mastectomy Right   . Breast surgery    . Mediastinoscopy N/A 04/28/2015    Procedure: MEDIASTINOSCOPY;  Surgeon: [name], MD;  Location: [facility] OR;  Service: Thoracic;  Laterality: N/A;    REVIEW OF SYSTEMS:  Review of Systems  Constitutional: Negative for fever, chills, weight loss, malaise/fatigue and diaphoresis.  HENT: Negative for congestion, ear discharge, ear pain, hearing loss, nosebleeds, sore throat and tinnitus.   Eyes: Negative for blurred vision, double vision, photophobia, pain, discharge and redness.  Respiratory: Negative for cough, hemoptysis, sputum production, shortness of breath, wheezing and stridor.   Cardiovascular: Negative for chest pain, palpitations, orthopnea, claudication, leg swelling and PND.  Gastrointestinal: Negative for heartburn, abdominal pain, diarrhea, constipation, blood in stool and melena.  Genitourinary: Negative.   Musculoskeletal: Negative.   Skin: Negative.   Neurological: Negative for dizziness,  tingling, focal weakness, seizures, weakness and headaches.  Endo/Heme/Allergies: Does not bruise/bleed easily.  Psychiatric/Behavioral: Negative for depression. The patient is not nervous/anxious and does not have insomnia.      PHYSICAL EXAMINATION: Physical Exam  Constitutional: She is oriented to person, place, and time and well-developed, well-nourished, and in no distress.  Comfortably seated and wheelchair on 2 L of oxygen via nasal cannula  HENT:  Head: Normocephalic and atraumatic.  Mouth/Throat: Oropharynx is clear and moist.  Eyes: Pupils are equal, round, and reactive to light.  Neck: Normal range of motion. Neck supple. No JVD present. No tracheal deviation present. No thyromegaly present.  Cardiovascular: Normal rate, regular rhythm, normal heart sounds and intact distal pulses.  Exam reveals no gallop and no friction rub.   No murmur heard. Pulmonary/Chest: Effort normal and breath sounds normal. No respiratory distress. She has no wheezes. She has no rales.  Abdominal: Soft. Bowel sounds are normal. She exhibits no distension and no mass. There is no tenderness.  Musculoskeletal: Normal range of motion. She exhibits no edema or tenderness.  Lymphadenopathy:    She has no cervical adenopathy.  Neurological: She is alert and oriented to person, place, and time. She has normal reflexes. Gait normal.  Skin: Skin is warm and dry. No rash noted.    ECOG PERFORMANCE STATUS: 2 - Symptomatic, <50% confined to bed  Blood pressure 127/65, pulse 84, temperature 98.2 F (36.8 C), temperature source Oral, resp. rate 18, height '5\' 5"'$  (1.651 m), weight 183 lb 11.2 oz (83.326 kg), SpO2 96 %.  LABORATORY DATA: Lab Results  Component Value Date   WBC 4.3 06/15/2015   HGB 11.7 06/15/2015   HCT 35.7 06/15/2015   MCV 86.4 06/15/2015   PLT 194 06/15/2015      Chemistry      Component Value Date/Time   NA 137 06/15/2015 1154   NA 140 04/28/2015 0710   K 4.2 06/15/2015 1154   K  4.4 04/28/2015 0710   CL 104 04/28/2015 0710   CO2 24 06/15/2015 1154   CO2 27 04/28/2015 0710   BUN 12.0 06/15/2015 1154   BUN 15 04/28/2015 0710   CREATININE 0.8 06/15/2015 1154   CREATININE 0.87 04/28/2015 0710   CREATININE 0.91 06/11/2014 1608      Component Value Date/Time   CALCIUM 9.3 06/15/2015 1154   CALCIUM 9.1 04/28/2015 0710   ALKPHOS 38* 06/15/2015 1154   ALKPHOS 42 04/28/2015 0710   AST 16 06/15/2015 1154   AST 16 04/28/2015 0710   ALT 18 06/15/2015 1154   ALT 12* 04/28/2015 0710   BILITOT 0.41 06/15/2015 1154   BILITOT 0.2* 04/28/2015 0710       RADIOGRAPHIC STUDIES:  No results found.   ASSESSMENT/PLAN:  No problem-specific assessment & plan notes found for this encounter.  the patient is a pleasant 80 year old Caucasian female recently diagnosed with stage IIIa non-small cell lung cancer. She is currently on oxygen at 2 L via nasal cannula. She still has some soreness from her recent mediastinal endoscopy. The MRI of her brain was negative for brain metastasis. The patient was discussed with and also seen by Dr. Julien Nordmann. She will continue with her course of concurrent chemoradiation with chemotherapy in the form of carboplatin for an AUC of 2 and paclitaxel 45 mg/m given weekly concurrent with radiation.   She will follow-up in 2 weeks for symptom management visit.   Awilda Metro E, PA-C 06/15/2015  All questions were answered. The patient knows to call the clinic with any problems, questions or concerns. We can certainly see the patient much sooner if necessary.  ADDENDUM: Hematology/Oncology Attending: I had a face to face encounter with the patient today. I recommended her care plan. This is a very pleasant 80 years old white female recently diagnosed with a stage IIIa non-small cell lung cancer currently undergoing concurrent chemoradiation with weekly carboplatin and paclitaxel. She is tolerating her treatment well with no significant adverse effect  except for fatigue and mild odynophagia. I recommended for the patient to continue her treatment as scheduled. She will proceed with cycle #6 today, The patient would come back for follow-up visit in 2 weeks for reevaluation to the end of her treatment. She was advised to call immediately if she has any concerning symptoms in the interval.  Disclaimer: This note was dictated with voice recognition software. Similar sounding words can inadvertently be transcribed  and may be missed upon review. Eilleen Kempf., MD 06/15/2015

## 2015-06-15 NOTE — Telephone Encounter (Signed)
per pof to sch pt appt-gave pt copy of avs-sent MW email to sch trrmt

## 2015-06-16 ENCOUNTER — Ambulatory Visit
Admission: RE | Admit: 2015-06-16 | Discharge: 2015-06-16 | Disposition: A | Discharge status: Home / Self Care | Payer: Medicare HMO | Source: Ambulatory Visit | Attending: Radiation Oncology | Admitting: Radiation Oncology

## 2015-06-16 DIAGNOSIS — Z510 Encounter for antineoplastic radiation therapy: Secondary | ICD-10-CM | POA: Diagnosis not present

## 2015-06-17 ENCOUNTER — Ambulatory Visit
Admission: RE | Admit: 2015-06-17 | Discharge: 2015-06-17 | Disposition: A | Discharge status: Home / Self Care | Payer: Medicare HMO | Source: Ambulatory Visit | Attending: Radiation Oncology | Admitting: Radiation Oncology

## 2015-06-17 DIAGNOSIS — Z510 Encounter for antineoplastic radiation therapy: Secondary | ICD-10-CM | POA: Diagnosis not present

## 2015-06-18 ENCOUNTER — Ambulatory Visit
Admission: RE | Admit: 2015-06-18 | Discharge: 2015-06-18 | Disposition: A | Discharge status: Home / Self Care | Payer: Medicare HMO | Source: Ambulatory Visit | Attending: Radiation Oncology | Admitting: Radiation Oncology

## 2015-06-19 ENCOUNTER — Encounter: Payer: Self-pay | Admitting: Radiation Oncology

## 2015-06-19 ENCOUNTER — Ambulatory Visit
Admission: RE | Admit: 2015-06-19 | Discharge: 2015-06-19 | Disposition: A | Discharge status: Home / Self Care | Payer: Medicare HMO | Source: Ambulatory Visit | Attending: Radiation Oncology | Admitting: Radiation Oncology

## 2015-06-19 ENCOUNTER — Telehealth: Payer: Self-pay | Admitting: Medical Oncology

## 2015-06-19 ENCOUNTER — Other Ambulatory Visit: Payer: Self-pay | Admitting: Internal Medicine

## 2015-06-19 VITALS — BP 126/76 | HR 52 | Resp 16 | Wt 182.8 lb

## 2015-06-19 DIAGNOSIS — C3411 Malignant neoplasm of upper lobe, right bronchus or lung: Secondary | ICD-10-CM

## 2015-06-19 NOTE — Telephone Encounter (Signed)
Left message with pt home and mobile nubmer and with Tommi Emery number to pick up rx on Monday or go to ED over weekend for uncontrolled pain.

## 2015-06-19 NOTE — Telephone Encounter (Signed)
This pertains to a refill request received via patient portal.  This Triage nurse did not receive or handle request.  See 06-19-2015 phone note.

## 2015-06-19 NOTE — Progress Notes (Signed)
  Radiation Oncology         [phone]   Name: Pamela Mcguire MRN: [id number]   Date: 06/19/2015  DOB: Oct 29, 1935     Weekly Radiation Therapy Management    ICD-9-CM ICD-10-CM   1. Adenocarcinoma of upper lobe of right lung 162.3 C34.11     Current Dose: 48 Gy  Planned Dose:  66 Gy  Narrative The patient presents for routine under treatment assessment. Weight and vitals stable. Denies pain. Reports fatigue. Reports a sore throat. Denies difficulty swallowing. Denies cough or SOB. Hyperpigmentation with dermatitis noted center of chest. Reports using Biafine as directed. Reports blurry vision continues. Redness around left eye noted. She was prescribed Romycin eye drops by her primary care physician, Pamela Second NP. Currently receiving chemotherapy with medical oncology. The patient is without complaint. Set-up films were reviewed. The chart was checked.  Physical Findings  weight is 182 lb 12.8 oz (82.918 kg). Her blood pressure is 126/76 and her pulse is 52. Her respiration is 16 and oxygen saturation is 100%. . Weight essentially stable.  No significant changes.  Impression The patient is tolerating radiation.  Plan Continue treatment as planned.    This document serves as a record of services personally performed by Pamela Pita, MD. It was created on his behalf by Pamela Mcguire, a trained medical scribe. The creation of this record is based on the scribe's personal observations and the provider's statements to them. This document has been checked and approved by the attending provider.      Pamela Mcguire, M.D.

## 2015-06-19 NOTE — Progress Notes (Addendum)
Weight and vitals stable. Denies pain. Reports fatigue. Reports a sore throat. Denies difficulty swallowing. Denies cough or SOB. Hyperpigmentation with dermatitis noted center of chest. Reports using Biafine as directed. Reports blurry vision continues. Redness around left eye noted.   BP 126/76 mmHg  Pulse 52  Resp 16  Wt 182 lb 12.8 oz (82.918 kg)  SpO2 100% Wt Readings from Last 3 Encounters:  06/19/15 182 lb 12.8 oz (82.918 kg)  06/15/15 183 lb 11.2 oz (83.326 kg)  06/12/15 185 lb 8 oz (84.142 kg)

## 2015-06-21 ENCOUNTER — Emergency Department (HOSPITAL_COMMUNITY): Payer: Medicare HMO

## 2015-06-21 ENCOUNTER — Inpatient Hospital Stay (HOSPITAL_COMMUNITY)
Admission: EM | Admit: 2015-06-21 | Discharge: 2015-06-26 | DRG: 181 | Disposition: A | Discharge status: Home Health | Payer: Medicare HMO | Attending: Internal Medicine | Admitting: Internal Medicine

## 2015-06-21 ENCOUNTER — Encounter (HOSPITAL_COMMUNITY): Payer: Self-pay | Admitting: Emergency Medicine

## 2015-06-21 DIAGNOSIS — Z7901 Long term (current) use of anticoagulants: Secondary | ICD-10-CM

## 2015-06-21 DIAGNOSIS — R0682 Tachypnea, not elsewhere classified: Secondary | ICD-10-CM | POA: Diagnosis present

## 2015-06-21 DIAGNOSIS — D649 Anemia, unspecified: Secondary | ICD-10-CM | POA: Diagnosis present

## 2015-06-21 DIAGNOSIS — R0602 Shortness of breath: Secondary | ICD-10-CM | POA: Diagnosis not present

## 2015-06-21 DIAGNOSIS — C3411 Malignant neoplasm of upper lobe, right bronchus or lung: Secondary | ICD-10-CM | POA: Diagnosis not present

## 2015-06-21 DIAGNOSIS — I472 Ventricular tachycardia: Secondary | ICD-10-CM | POA: Diagnosis present

## 2015-06-21 DIAGNOSIS — Z79899 Other long term (current) drug therapy: Secondary | ICD-10-CM

## 2015-06-21 DIAGNOSIS — Z79891 Long term (current) use of opiate analgesic: Secondary | ICD-10-CM

## 2015-06-21 DIAGNOSIS — E785 Hyperlipidemia, unspecified: Secondary | ICD-10-CM | POA: Diagnosis present

## 2015-06-21 DIAGNOSIS — F329 Major depressive disorder, single episode, unspecified: Secondary | ICD-10-CM | POA: Diagnosis present

## 2015-06-21 DIAGNOSIS — G4730 Sleep apnea, unspecified: Secondary | ICD-10-CM | POA: Diagnosis present

## 2015-06-21 DIAGNOSIS — T451X5A Adverse effect of antineoplastic and immunosuppressive drugs, initial encounter: Secondary | ICD-10-CM | POA: Diagnosis present

## 2015-06-21 DIAGNOSIS — Z853 Personal history of malignant neoplasm of breast: Secondary | ICD-10-CM

## 2015-06-21 DIAGNOSIS — F419 Anxiety disorder, unspecified: Secondary | ICD-10-CM | POA: Diagnosis present

## 2015-06-21 DIAGNOSIS — Z87891 Personal history of nicotine dependence: Secondary | ICD-10-CM

## 2015-06-21 DIAGNOSIS — R6510 Systemic inflammatory response syndrome (SIRS) of non-infectious origin without acute organ dysfunction: Secondary | ICD-10-CM | POA: Diagnosis present

## 2015-06-21 DIAGNOSIS — N179 Acute kidney failure, unspecified: Secondary | ICD-10-CM | POA: Diagnosis present

## 2015-06-21 DIAGNOSIS — I4891 Unspecified atrial fibrillation: Secondary | ICD-10-CM | POA: Diagnosis present

## 2015-06-21 DIAGNOSIS — Z9011 Acquired absence of right breast and nipple: Secondary | ICD-10-CM | POA: Diagnosis present

## 2015-06-21 DIAGNOSIS — M1990 Unspecified osteoarthritis, unspecified site: Secondary | ICD-10-CM | POA: Diagnosis present

## 2015-06-21 DIAGNOSIS — I482 Chronic atrial fibrillation: Secondary | ICD-10-CM | POA: Diagnosis present

## 2015-06-21 DIAGNOSIS — R21 Rash and other nonspecific skin eruption: Secondary | ICD-10-CM | POA: Diagnosis present

## 2015-06-21 DIAGNOSIS — D6481 Anemia due to antineoplastic chemotherapy: Secondary | ICD-10-CM | POA: Diagnosis present

## 2015-06-21 DIAGNOSIS — I2510 Atherosclerotic heart disease of native coronary artery without angina pectoris: Secondary | ICD-10-CM | POA: Diagnosis present

## 2015-06-21 DIAGNOSIS — K219 Gastro-esophageal reflux disease without esophagitis: Secondary | ICD-10-CM | POA: Diagnosis present

## 2015-06-21 DIAGNOSIS — I10 Essential (primary) hypertension: Secondary | ICD-10-CM | POA: Diagnosis present

## 2015-06-21 DIAGNOSIS — E876 Hypokalemia: Secondary | ICD-10-CM | POA: Diagnosis present

## 2015-06-21 DIAGNOSIS — Z923 Personal history of irradiation: Secondary | ICD-10-CM

## 2015-06-21 DIAGNOSIS — D701 Agranulocytosis secondary to cancer chemotherapy: Secondary | ICD-10-CM | POA: Diagnosis present

## 2015-06-21 DIAGNOSIS — Z9221 Personal history of antineoplastic chemotherapy: Secondary | ICD-10-CM

## 2015-06-21 DIAGNOSIS — I480 Paroxysmal atrial fibrillation: Secondary | ICD-10-CM | POA: Diagnosis present

## 2015-06-21 DIAGNOSIS — E860 Dehydration: Secondary | ICD-10-CM | POA: Diagnosis present

## 2015-06-21 LAB — CBC WITH DIFFERENTIAL/PLATELET
BASOS ABS: 0 10*3/uL (ref 0.0–0.1)
Basophils Relative: 0 % (ref 0–1)
Eosinophils Absolute: 0 10*3/uL (ref 0.0–0.7)
Eosinophils Relative: 1 % (ref 0–5)
HCT: 32.1 % — ABNORMAL LOW (ref 36.0–46.0)
Hemoglobin: 10.6 g/dL — ABNORMAL LOW (ref 12.0–15.0)
Lymphocytes Relative: 16 % (ref 12–46)
Lymphs Abs: 0.5 10*3/uL — ABNORMAL LOW (ref 0.7–4.0)
MCH: 28.2 pg (ref 26.0–34.0)
MCHC: 33 g/dL (ref 30.0–36.0)
MCV: 85.4 fL (ref 78.0–100.0)
Monocytes Absolute: 0.1 10*3/uL (ref 0.1–1.0)
Monocytes Relative: 5 % (ref 3–12)
NEUTROS ABS: 2.4 10*3/uL (ref 1.7–7.7)
Neutrophils Relative %: 78 % — ABNORMAL HIGH (ref 43–77)
Platelets: 191 10*3/uL (ref 150–400)
RBC: 3.76 MIL/uL — ABNORMAL LOW (ref 3.87–5.11)
RDW: 14.8 % (ref 11.5–15.5)
WBC: 3 10*3/uL — ABNORMAL LOW (ref 4.0–10.5)

## 2015-06-21 LAB — TROPONIN I

## 2015-06-21 LAB — COMPREHENSIVE METABOLIC PANEL
ALT: 15 U/L (ref 14–54)
AST: 16 U/L (ref 15–41)
Albumin: 3.4 g/dL — ABNORMAL LOW (ref 3.5–5.0)
Alkaline Phosphatase: 34 U/L — ABNORMAL LOW (ref 38–126)
Anion gap: 7 (ref 5–15)
BILIRUBIN TOTAL: 0.6 mg/dL (ref 0.3–1.2)
BUN: 17 mg/dL (ref 6–20)
CALCIUM: 8.6 mg/dL — AB (ref 8.9–10.3)
CO2: 24 mmol/L (ref 22–32)
CREATININE: 0.98 mg/dL (ref 0.44–1.00)
Chloride: 104 mmol/L (ref 101–111)
GFR calc non Af Amer: 54 mL/min — ABNORMAL LOW (ref >60)
Glucose, Bld: 137 mg/dL — ABNORMAL HIGH (ref 65–99)
Potassium: 3.7 mmol/L (ref 3.5–5.1)
Sodium: 135 mmol/L (ref 135–145)
Total Protein: 6.1 g/dL — ABNORMAL LOW (ref 6.5–8.1)

## 2015-06-21 LAB — URINALYSIS, ROUTINE W REFLEX MICROSCOPIC
GLUCOSE, UA: NEGATIVE mg/dL
Ketones, ur: NEGATIVE mg/dL
Nitrite: NEGATIVE
Protein, ur: NEGATIVE mg/dL
Specific Gravity, Urine: 1.026 (ref 1.005–1.030)
UROBILINOGEN UA: 0.2 mg/dL (ref 0.0–1.0)
pH: 6.5 (ref 5.0–8.0)

## 2015-06-21 LAB — URINE MICROSCOPIC-ADD ON

## 2015-06-21 LAB — I-STAT CG4 LACTIC ACID, ED: Lactic Acid, Venous: 2.8 mmol/L (ref 0.5–2.0)

## 2015-06-21 MED ORDER — LORAZEPAM 1 MG PO TABS
1.0000 mg | ORAL_TABLET | Freq: Once | ORAL | Status: AC
  Administered 2015-06-21: 1 mg via ORAL
  Filled 2015-06-21: qty 1

## 2015-06-21 NOTE — ED Notes (Signed)
Patient went to xray 

## 2015-06-21 NOTE — ED Notes (Signed)
Patients family requested that patient gets ativan before trying again to get an PIV on patient.

## 2015-06-21 NOTE — ED Notes (Signed)
Below orders not completed by EW. 

## 2015-06-21 NOTE — ED Notes (Signed)
Patient has had increase weakness and SOB. Patient was on 96% room air and EMS put patient on [place] of 3L for comfort. Patient had last chemo session on Monday (June 14, 2015).

## 2015-06-21 NOTE — ED Provider Notes (Signed)
CSN: [id number]     Arrival date & time 06/21/15  2039 History   First MD Initiated Contact with Patient 06/21/15 2053     Chief Complaint  Patient presents with  . chemo card      Patient has had increase weakness and SOB. Patient was on 96% room air and EMS put patient on [place] of 3L for comfort. Patient had last chemo session on Monday (June 14, 2015).      (Consider location/radiation/quality/duration/timing/severity/associated sxs/prior Treatment) HPI Comments: 80 year old female with a history of lung cancer currently getting chemotherapy. Presents to the emergency department today at the past of her daughter for having fast breathing and an episode where she appeared clammy and pale. Was short in nature no other stated symptoms. No recent fevers nausea or vomiting. This happened one time before and she had to be cardioverted for A. fib. She has no pain anywhere. Does have a rash on her anterior chest that is old. An itchy illness and her back. No other associated symptoms.    Past Medical History  Diagnosis Date  . Hyperlipidemia   . Diverticulitis   . CAD (coronary artery disease)   . AF (atrial fibrillation)   . Hypertension   . Seasonal allergies   . Prediabetes   . Depression   . Shortness of breath dyspnea   . Anxiety   . Urinary frequency   . GERD (gastroesophageal reflux disease)   . Breast cancer     right mastectomy  . Sleep apnea      dx'd 10/2014, has not received cpap yet (04/28/2015)  . Arthritis     "thighs, hips, arms" (04/28/2015)  . Non-small cell carcinoma of lung, stage 3 04/30/2015   Past Surgical History  Procedure Laterality Date  . Cardioversion    . Tonsillectomy    . Colonoscopy    . Cesarean section  X 2  . Mastectomy Right   . Breast surgery    . Mediastinoscopy N/A 04/28/2015    Procedure: MEDIASTINOSCOPY;  Surgeon: [name], MD;  Location: [facility] OR;  Service: Thoracic;  Laterality: N/A;   History reviewed. No pertinent family  history. History  Substance Use Topics  . Smoking status: Former Smoker -- 1.00 packs/day for 35 years    Types: Cigarettes    Quit date: 11/28/1992  . Smokeless tobacco: Never Used     Comment: "stopped smoking at age 67"  . Alcohol Use: 5.4 oz/week    9 Glasses of wine, 0 Standard drinks or equivalent per week     Comment: 04/28/2015 "drink 3, 6oz,  glasses of wine on Sat & Sun"   OB History    No data available     Review of Systems  Constitutional: Negative for fever, chills and fatigue.  Eyes: Negative for pain.  Respiratory: Positive for shortness of breath. Negative for chest tightness.   Gastrointestinal: Negative for nausea, vomiting and abdominal pain.  Endocrine: Negative for polydipsia and polyuria.  Genitourinary: Negative for dysuria.  Musculoskeletal: Negative for neck pain.  Skin: Negative for pallor and wound.  All other systems reviewed and are negative.     Allergies  Review of patient's allergies indicates no known allergies.  Home Medications   Prior to Admission medications   Medication Sig Start Date End Date Taking? Authorizing Provider  acetaminophen (TYLENOL) 500 MG tablet Take 500-1,000 mg by mouth every 6 (six) hours as needed for mild pain or headache.    Historical Provider, MD  albuterol (PROVENTIL HFA;VENTOLIN HFA) 108 (90 BASE) MCG/ACT inhaler Inhale 1 puff into the lungs every 6 (six) hours as needed for wheezing or shortness of breath.    Historical Provider, MD  Cholecalciferol (VITAMIN D PO) Take 1 tablet by mouth every morning.     Historical Provider, MD  Cyanocobalamin (VITAMIN B-12 PO) Take 1 capsule by mouth every morning.    Historical Provider, MD  ELIQUIS 5 MG TABS tablet TAKE 1 TABLET BY MOUTH TWICE DAILY 01/27/15   Pixie Casino, MD  emollient (BIAFINE) cream Apply topically as needed. 06/09/15   Tyler Pita, MD  erythromycin [facility]) ophthalmic ointment Place 1 application into both eyes 3 (three) times daily. 06/02/15    Susanne Borders, NP  escitalopram (LEXAPRO) 10 MG tablet Take 1 tablet (10 mg total) by mouth daily. 01/06/15   Pixie Casino, MD  ezetimibe-simvastatin (VYTORIN) 10-40 MG per tablet Take 1 tablet by mouth daily. 03/04/15   Pixie Casino, MD  HYDROcodone-acetaminophen (NORCO/VICODIN) 5-325 MG per tablet Take 1 tablet by mouth every 6 (six) hours as needed for moderate pain. 05/14/15   Curt Bears, MD  loratadine (CLARITIN) 10 MG tablet Take 10 mg by mouth at bedtime as needed for allergies.    Historical Provider, MD  LORazepam (ATIVAN) 1 MG tablet Take 1 tablet (1 mg total) by mouth every 8 (eight) hours. 05/27/15   Tyler Pita, MD  metoprolol tartrate (LOPRESSOR) 25 MG tablet Take 1 tablet (25 mg total) by mouth 2 (two) times daily. 01/05/15   Pixie Casino, MD  mometasone-formoterol (DULERA) 100-5 MCG/ACT AERO Inhale 2 puffs into the lungs 2 (two) times daily as needed for wheezing.     [name], MD  Omega-3 Fatty Acids (FISH OIL) 1200 MG CAPS Take 2 capsules by mouth daily.    Historical Provider, MD  oxyCODONE (ROXICODONE) 5 MG immediate release tablet Take 1 tablet (5 mg total) by mouth every 4 (four) hours as needed for severe pain. 04/29/15   Donielle Liston Alba, PA-C  pantoprazole (PROTONIX) 40 MG tablet Take 40 mg by mouth daily.    Historical Provider, MD  Probiotic Product (PROBIOTIC PO) Take 1 capsule by mouth 2 (two) times daily.    Historical Provider, MD  prochlorperazine (COMPAZINE) 10 MG tablet Take 1 tablet (10 mg total) by mouth every 6 (six) hours as needed for nausea or vomiting. 04/30/15   Carlton Adam, PA-C  sucralfate (CARAFATE) 1 G tablet Take 1 tablet (1 g total) by mouth 4 (four) times daily -  with meals and at bedtime. 5 min before meals for radiation induced esophagitis 05/21/15   Tyler Pita, MD  traZODone (DESYREL) 100 MG tablet Take 1 tablet (100 mg total) by mouth at bedtime. 05/11/15   Curt Bears, MD  Wound Cleansers (RADIAPLEX EX) Apply topically.     Historical Provider, MD   BP 166/131 mmHg  Pulse 98  Temp(Src) 97.5 F (36.4 C) (Oral)  Resp 30  Ht '5\' 5"'$  (1.651 m)  Wt 182 lb (82.555 kg)  BMI 30.29 kg/m2  SpO2 100% Physical Exam  Constitutional: She is oriented to person, place, and time. She appears well-developed and well-nourished.  HENT:  Head: Normocephalic and atraumatic.  Eyes: Pupils are equal, round, and reactive to light.  Cardiovascular: Normal rate.   Pulmonary/Chest: Effort normal and breath sounds normal. Tachypnea noted. She has no decreased breath sounds. She has no wheezes. She has no rhonchi. She has no rales.  Abdominal: Soft.  She exhibits no distension. There is no tenderness.  Musculoskeletal: Normal range of motion. She exhibits no edema or tenderness.  Neurological: She is alert and oriented to person, place, and time.  Skin: Skin is warm and dry.  Nursing note and vitals reviewed.   ED Course  Procedures (including critical care time) Labs Review Labs Reviewed  COMPREHENSIVE METABOLIC PANEL - Abnormal; Notable for the following:    Glucose, Bld 137 (*)    Calcium 8.6 (*)    Total Protein 6.1 (*)    Albumin 3.4 (*)    Alkaline Phosphatase 34 (*)    GFR calc non Af Amer 54 (*)    All other components within normal limits  URINALYSIS, ROUTINE W REFLEX MICROSCOPIC (NOT AT [facility]) - Abnormal; Notable for the following:    Color, Urine AMBER (*)    APPearance CLOUDY (*)    Hgb urine dipstick MODERATE (*)    Bilirubin Urine SMALL (*)    Leukocytes, UA MODERATE (*)    All other components within normal limits  CBC WITH DIFFERENTIAL/PLATELET - Abnormal; Notable for the following:    WBC 3.0 (*)    RBC 3.76 (*)    Hemoglobin 10.6 (*)    HCT 32.1 (*)    Neutrophils Relative % 78 (*)    Lymphs Abs 0.5 (*)    All other components within normal limits  URINE MICROSCOPIC-ADD ON - Abnormal; Notable for the following:    Squamous Epithelial / LPF FEW (*)    All other components within normal limits   PROTIME-INR - Abnormal; Notable for the following:    Prothrombin Time 16.1 (*)    All other components within normal limits  BASIC METABOLIC PANEL - Abnormal; Notable for the following:    Glucose, Bld 121 (*)    Calcium 7.5 (*)    Anion gap 3 (*)    All other components within normal limits  CBC - Abnormal; Notable for the following:    WBC 1.9 (*)    RBC 3.46 (*)    Hemoglobin 10.2 (*)    HCT 30.8 (*)    All other components within normal limits  I-STAT CG4 LACTIC ACID, ED - Abnormal; Notable for the following:    Lactic Acid, Venous 2.80 (*)    All other components within normal limits  I-STAT CG4 LACTIC ACID, ED - Abnormal; Notable for the following:    Lactic Acid, Venous <0.30 (*)    All other components within normal limits  MRSA PCR SCREENING  CULTURE, BLOOD (ROUTINE X 2)  CULTURE, BLOOD (ROUTINE X 2)  URINE CULTURE  TROPONIN I  LACTIC ACID, PLASMA  PROCALCITONIN  APTT  CBC  COMPREHENSIVE METABOLIC PANEL    Imaging Review Dg Chest 2 View  06/21/2015   CLINICAL DATA:  Lung cancer. Chest pains today. Dyspnea. Tachypnea.  EXAM: CHEST  2 VIEW  COMPARISON:  05/13/2015  FINDINGS: The right upper lobe mass is unchanged. There is no evidence of a superimposed acute infiltrate or congestive heart failure. Pulmonary vasculature is normal. There are no pleural effusions. Heart size is normal and unchanged.  IMPRESSION: Known right upper lobe mass.  No superimposed acute findings.   Electronically Signed   By: Andreas Newport M.D.   On: 06/21/2015 21:21   Ct Angio Chest Pe W/cm &/or Wo Cm  06/22/2015   CLINICAL DATA:  Tachycardia and dyspnea. Ongoing chemotherapy for lung cancer.  EXAM: CT ANGIOGRAPHY CHEST WITH CONTRAST  TECHNIQUE: Multidetector CT imaging  of the chest was performed using the standard protocol during bolus administration of intravenous contrast. Multiplanar CT image reconstructions and MIPs were obtained to evaluate the vascular anatomy.  CONTRAST:  15m  OMNIPAQUE IOHEXOL 350 MG/ML SOLN  COMPARISON:  PET 04/10/2015  FINDINGS: Cardiovascular: There is good opacification of the pulmonary arteries. There is no pulmonary embolism. The thoracic aorta is normal in caliber and intact. There are atherosclerotic calcifications of the aorta and Coronary arteries.  Lungs: The right apical lung mass is slightly smaller, measuring 8.4 x 16.9 mm and previously measuring 12.9 x 18.1 mm on 04/10/2015. Chronic fibrotic appearing interstitial coarsening is again evident. No superimposed acute disease is evident.  Central airways: Patent  Effusions: None  Lymphadenopathy: Unchanged mediastinal and right hilar adenopathy  Esophagus: Unremarkable  Upper abdomen: Small hiatal hernia  Musculoskeletal: No significant skeletal lesion. Moderate kyphosis and degenerative disc disease.  Review of the MIP images confirms the above findings.  IMPRESSION: 1. Negative for acute pulmonary embolism. There is moderate atherosclerotic calcification of the aorta and Coronary arteries. 2. Mild size reduction of the right apical lung mass. Unchanged mediastinal and hilar adenopathy. 3. No acute findings are evident.   Electronically Signed   By: DAndreas NewportM.D.   On: 06/22/2015 01:06     EKG Interpretation   Date/Time:  Sunday June 21 2015 20:41:06 EDT Ventricular Rate:  106 PR Interval:  129 QRS Duration: 82 QT Interval:  376 QTC Calculation: 499 R Axis:   61 Text Interpretation:  Sinus tachycardia Supraventricular bigeminy Abnormal  R-wave progression, early transition Minimal ST depression, lateral leads  Confirmed by MSt. Bernards Behavioral HealthMD, JCorene [phone] on 06/21/2015 11:10:53 PM      MDM   Final diagnoses:  Tachypnea    80yo F w/ persisent worsening of dyspnea over last 24 hours with intermittent clammy, diaphoretic gray appearing episodes. Exam as above with tachycardia, tachypnea and slight anxiety. Slight rash on anterior chest that is chronic. No other obvious sources of  infection. Rectal temp of 100.1.  ddx broad with PE, sepsis, ACS and anxiety as most likely. CTA done to eval for PE and negative. No evidence of pneumonia either. ECG with lateral depressions new from previous however initial troponin unremarkable, will likely need trending. Urine clean. Doubt meningitis without headache, AMS, stiff neck.  Will admit for monitoring. Vanc/Zosyn given for immune compromised until cultures clear.   The patient appears reasonably stabilized for admission considering the current resources, flow, and capabilities available in the ED at this time, and I doubt any other [facility] further screening and/or treatment in the ED prior to admission.    JMerrily Pew MD 06/22/15 2151

## 2015-06-22 ENCOUNTER — Encounter: Payer: Self-pay | Admitting: *Deleted

## 2015-06-22 ENCOUNTER — Telehealth: Payer: Self-pay | Admitting: Oncology

## 2015-06-22 ENCOUNTER — Ambulatory Visit: Payer: Medicare HMO

## 2015-06-22 ENCOUNTER — Encounter (HOSPITAL_COMMUNITY): Payer: Self-pay | Admitting: *Deleted

## 2015-06-22 ENCOUNTER — Other Ambulatory Visit: Payer: Medicare HMO

## 2015-06-22 ENCOUNTER — Emergency Department (HOSPITAL_COMMUNITY): Payer: Medicare HMO

## 2015-06-22 ENCOUNTER — Ambulatory Visit
Admission: RE | Admit: 2015-06-22 | Discharge: 2015-06-22 | Disposition: A | Discharge status: Home / Self Care | Payer: Medicare HMO | Source: Ambulatory Visit | Attending: Radiation Oncology | Admitting: Radiation Oncology

## 2015-06-22 DIAGNOSIS — I2510 Atherosclerotic heart disease of native coronary artery without angina pectoris: Secondary | ICD-10-CM | POA: Diagnosis present

## 2015-06-22 DIAGNOSIS — I482 Chronic atrial fibrillation: Secondary | ICD-10-CM | POA: Diagnosis present

## 2015-06-22 DIAGNOSIS — A419 Sepsis, unspecified organism: Secondary | ICD-10-CM

## 2015-06-22 DIAGNOSIS — D649 Anemia, unspecified: Secondary | ICD-10-CM | POA: Diagnosis present

## 2015-06-22 DIAGNOSIS — D72819 Decreased white blood cell count, unspecified: Secondary | ICD-10-CM

## 2015-06-22 DIAGNOSIS — F419 Anxiety disorder, unspecified: Secondary | ICD-10-CM | POA: Diagnosis present

## 2015-06-22 DIAGNOSIS — E785 Hyperlipidemia, unspecified: Secondary | ICD-10-CM | POA: Diagnosis present

## 2015-06-22 DIAGNOSIS — C3411 Malignant neoplasm of upper lobe, right bronchus or lung: Secondary | ICD-10-CM | POA: Diagnosis present

## 2015-06-22 DIAGNOSIS — I10 Essential (primary) hypertension: Secondary | ICD-10-CM | POA: Diagnosis not present

## 2015-06-22 DIAGNOSIS — E860 Dehydration: Secondary | ICD-10-CM | POA: Diagnosis present

## 2015-06-22 DIAGNOSIS — R6510 Systemic inflammatory response syndrome (SIRS) of non-infectious origin without acute organ dysfunction: Secondary | ICD-10-CM | POA: Diagnosis present

## 2015-06-22 DIAGNOSIS — D6481 Anemia due to antineoplastic chemotherapy: Secondary | ICD-10-CM

## 2015-06-22 DIAGNOSIS — Z87891 Personal history of nicotine dependence: Secondary | ICD-10-CM | POA: Diagnosis not present

## 2015-06-22 DIAGNOSIS — N179 Acute kidney failure, unspecified: Secondary | ICD-10-CM | POA: Diagnosis not present

## 2015-06-22 DIAGNOSIS — R0682 Tachypnea, not elsewhere classified: Secondary | ICD-10-CM | POA: Diagnosis not present

## 2015-06-22 DIAGNOSIS — M1990 Unspecified osteoarthritis, unspecified site: Secondary | ICD-10-CM | POA: Diagnosis present

## 2015-06-22 DIAGNOSIS — C771 Secondary and unspecified malignant neoplasm of intrathoracic lymph nodes: Secondary | ICD-10-CM | POA: Diagnosis not present

## 2015-06-22 DIAGNOSIS — Z79891 Long term (current) use of opiate analgesic: Secondary | ICD-10-CM | POA: Diagnosis not present

## 2015-06-22 DIAGNOSIS — D701 Agranulocytosis secondary to cancer chemotherapy: Secondary | ICD-10-CM | POA: Diagnosis present

## 2015-06-22 DIAGNOSIS — E876 Hypokalemia: Secondary | ICD-10-CM | POA: Diagnosis present

## 2015-06-22 DIAGNOSIS — K219 Gastro-esophageal reflux disease without esophagitis: Secondary | ICD-10-CM | POA: Diagnosis present

## 2015-06-22 DIAGNOSIS — C3491 Malignant neoplasm of unspecified part of right bronchus or lung: Secondary | ICD-10-CM

## 2015-06-22 DIAGNOSIS — T451X5A Adverse effect of antineoplastic and immunosuppressive drugs, initial encounter: Secondary | ICD-10-CM | POA: Diagnosis not present

## 2015-06-22 DIAGNOSIS — I472 Ventricular tachycardia: Secondary | ICD-10-CM | POA: Diagnosis not present

## 2015-06-22 DIAGNOSIS — Z7901 Long term (current) use of anticoagulants: Secondary | ICD-10-CM | POA: Diagnosis not present

## 2015-06-22 DIAGNOSIS — C3410 Malignant neoplasm of upper lobe, unspecified bronchus or lung: Secondary | ICD-10-CM | POA: Insufficient documentation

## 2015-06-22 DIAGNOSIS — R21 Rash and other nonspecific skin eruption: Secondary | ICD-10-CM | POA: Diagnosis present

## 2015-06-22 DIAGNOSIS — Z79899 Other long term (current) drug therapy: Secondary | ICD-10-CM | POA: Diagnosis not present

## 2015-06-22 DIAGNOSIS — Z923 Personal history of irradiation: Secondary | ICD-10-CM | POA: Diagnosis not present

## 2015-06-22 DIAGNOSIS — G4730 Sleep apnea, unspecified: Secondary | ICD-10-CM | POA: Diagnosis present

## 2015-06-22 DIAGNOSIS — Z9221 Personal history of antineoplastic chemotherapy: Secondary | ICD-10-CM | POA: Diagnosis not present

## 2015-06-22 DIAGNOSIS — F329 Major depressive disorder, single episode, unspecified: Secondary | ICD-10-CM | POA: Diagnosis present

## 2015-06-22 DIAGNOSIS — Z853 Personal history of malignant neoplasm of breast: Secondary | ICD-10-CM | POA: Diagnosis not present

## 2015-06-22 DIAGNOSIS — Z9011 Acquired absence of right breast and nipple: Secondary | ICD-10-CM | POA: Diagnosis present

## 2015-06-22 DIAGNOSIS — R0602 Shortness of breath: Secondary | ICD-10-CM | POA: Diagnosis present

## 2015-06-22 DIAGNOSIS — I480 Paroxysmal atrial fibrillation: Secondary | ICD-10-CM | POA: Diagnosis not present

## 2015-06-22 LAB — LACTIC ACID, PLASMA: Lactic Acid, Venous: 1.8 mmol/L (ref 0.5–2.0)

## 2015-06-22 LAB — CBC
HCT: 30.8 % — ABNORMAL LOW (ref 36.0–46.0)
Hemoglobin: 10.2 g/dL — ABNORMAL LOW (ref 12.0–15.0)
MCH: 29.5 pg (ref 26.0–34.0)
MCHC: 33.1 g/dL (ref 30.0–36.0)
MCV: 89 fL (ref 78.0–100.0)
Platelets: 173 10*3/uL (ref 150–400)
RBC: 3.46 MIL/uL — ABNORMAL LOW (ref 3.87–5.11)
RDW: 15.1 % (ref 11.5–15.5)
WBC: 1.9 10*3/uL — AB (ref 4.0–10.5)

## 2015-06-22 LAB — BASIC METABOLIC PANEL
Anion gap: 3 — ABNORMAL LOW (ref 5–15)
BUN: 14 mg/dL (ref 6–20)
CALCIUM: 7.5 mg/dL — AB (ref 8.9–10.3)
CO2: 24 mmol/L (ref 22–32)
Chloride: 110 mmol/L (ref 101–111)
Creatinine, Ser: 0.84 mg/dL (ref 0.44–1.00)
Glucose, Bld: 121 mg/dL — ABNORMAL HIGH (ref 65–99)
Potassium: 3.7 mmol/L (ref 3.5–5.1)
SODIUM: 137 mmol/L (ref 135–145)

## 2015-06-22 LAB — MRSA PCR SCREENING: MRSA by PCR: NEGATIVE

## 2015-06-22 LAB — I-STAT CG4 LACTIC ACID, ED

## 2015-06-22 LAB — PROTIME-INR
INR: 1.28 (ref 0.00–1.49)
Prothrombin Time: 16.1 seconds — ABNORMAL HIGH (ref 11.6–15.2)

## 2015-06-22 LAB — APTT: APTT: 32 s (ref 24–37)

## 2015-06-22 LAB — PROCALCITONIN

## 2015-06-22 MED ORDER — METOPROLOL TARTRATE 25 MG PO TABS
25.0000 mg | ORAL_TABLET | Freq: Two times a day (BID) | ORAL | Status: DC
  Administered 2015-06-22 – 2015-06-26 (×9): 25 mg via ORAL
  Filled 2015-06-22 (×9): qty 1

## 2015-06-22 MED ORDER — ESCITALOPRAM OXALATE 10 MG PO TABS
10.0000 mg | ORAL_TABLET | Freq: Every day | ORAL | Status: DC
  Administered 2015-06-22 – 2015-06-26 (×5): 10 mg via ORAL
  Filled 2015-06-22 (×5): qty 1

## 2015-06-22 MED ORDER — ALUM & MAG HYDROXIDE-SIMETH 200-200-20 MG/5ML PO SUSP
30.0000 mL | Freq: Four times a day (QID) | ORAL | Status: DC | PRN
Start: 2015-06-22 — End: 2015-06-26

## 2015-06-22 MED ORDER — SODIUM CHLORIDE 0.9 % IV BOLUS (SEPSIS): 500.0000 mL | INTRAVENOUS | Status: AC

## 2015-06-22 MED ORDER — PANTOPRAZOLE SODIUM 40 MG PO TBEC
40.0000 mg | DELAYED_RELEASE_TABLET | Freq: Every day | ORAL | Status: DC
  Administered 2015-06-22 – 2015-06-26 (×5): 40 mg via ORAL
  Filled 2015-06-22 (×7): qty 1

## 2015-06-22 MED ORDER — ACETAMINOPHEN 650 MG RE SUPP
650.0000 mg | Freq: Four times a day (QID) | RECTAL | Status: DC | PRN
Start: 2015-06-22 — End: 2015-06-26

## 2015-06-22 MED ORDER — PIPERACILLIN-TAZOBACTAM 3.375 G IVPB 30 MIN: 3.3750 g | Freq: Once | INTRAVENOUS | Status: DC

## 2015-06-22 MED ORDER — OXYCODONE HCL 5 MG PO TABS: 5.0000 mg | ORAL_TABLET | ORAL | Status: DC | PRN

## 2015-06-22 MED ORDER — PIPERACILLIN-TAZOBACTAM 3.375 G IVPB
3.3750 g | Freq: Three times a day (TID) | INTRAVENOUS | Status: DC
  Administered 2015-06-22 – 2015-06-24 (×8): 3.375 g via INTRAVENOUS
  Filled 2015-06-22 (×9): qty 50

## 2015-06-22 MED ORDER — SODIUM CHLORIDE 0.9 % IV SOLN
INTRAVENOUS | Status: DC
  Administered 2015-06-22: 75 mL via INTRAVENOUS
  Administered 2015-06-23 – 2015-06-26 (×5): via INTRAVENOUS

## 2015-06-22 MED ORDER — VANCOMYCIN HCL IN DEXTROSE 750-5 MG/150ML-% IV SOLN
750.0000 mg | Freq: Two times a day (BID) | INTRAVENOUS | Status: DC
  Administered 2015-06-22 – 2015-06-24 (×4): 750 mg via INTRAVENOUS
  Filled 2015-06-22 (×5): qty 150

## 2015-06-22 MED ORDER — BOOST / RESOURCE BREEZE PO LIQD
1.0000 | Freq: Three times a day (TID) | ORAL | Status: DC
  Administered 2015-06-22 – 2015-06-26 (×9): 1 via ORAL

## 2015-06-22 MED ORDER — ACETAMINOPHEN 325 MG PO TABS: 650.0000 mg | ORAL_TABLET | Freq: Four times a day (QID) | ORAL | Status: DC | PRN

## 2015-06-22 MED ORDER — IOHEXOL 350 MG/ML SOLN
100.0000 mL | Freq: Once | INTRAVENOUS | Status: AC | PRN
  Administered 2015-06-22: 100 mL via INTRAVENOUS

## 2015-06-22 MED ORDER — ONDANSETRON HCL 4 MG PO TABS: 4.0000 mg | ORAL_TABLET | Freq: Four times a day (QID) | ORAL | Status: DC | PRN

## 2015-06-22 MED ORDER — LORAZEPAM 1 MG PO TABS
1.0000 mg | ORAL_TABLET | Freq: Three times a day (TID) | ORAL | Status: DC
  Administered 2015-06-22 – 2015-06-24 (×8): 1 mg via ORAL
  Filled 2015-06-22 (×8): qty 1

## 2015-06-22 MED ORDER — SUCRALFATE 1 G PO TABS
1.0000 g | ORAL_TABLET | Freq: Three times a day (TID) | ORAL | Status: DC
  Administered 2015-06-22 – 2015-06-26 (×18): 1 g via ORAL
  Filled 2015-06-22 (×20): qty 1

## 2015-06-22 MED ORDER — VANCOMYCIN HCL 10 G IV SOLR
1500.0000 mg | Freq: Once | INTRAVENOUS | Status: AC
  Administered 2015-06-22: 1500 mg via INTRAVENOUS
  Filled 2015-06-22: qty 1500

## 2015-06-22 MED ORDER — APIXABAN 5 MG PO TABS
5.0000 mg | ORAL_TABLET | Freq: Two times a day (BID) | ORAL | Status: DC
  Administered 2015-06-22 – 2015-06-26 (×9): 5 mg via ORAL
  Filled 2015-06-22 (×10): qty 1

## 2015-06-22 MED ORDER — IPRATROPIUM-ALBUTEROL 0.5-2.5 (3) MG/3ML IN SOLN: 3.0000 mL | RESPIRATORY_TRACT | Status: DC | PRN

## 2015-06-22 MED ORDER — FLORA-Q PO CAPS
1.0000 | ORAL_CAPSULE | Freq: Two times a day (BID) | ORAL | Status: DC
  Administered 2015-06-22 – 2015-06-23 (×3): 1 via ORAL
  Filled 2015-06-22 (×6): qty 1

## 2015-06-22 MED ORDER — EZETIMIBE-SIMVASTATIN 10-40 MG PO TABS
1.0000 | ORAL_TABLET | Freq: Every day | ORAL | Status: DC
  Administered 2015-06-22 – 2015-06-26 (×5): 1 via ORAL
  Filled 2015-06-22 (×5): qty 1

## 2015-06-22 MED ORDER — LORATADINE 10 MG PO TABS
10.0000 mg | ORAL_TABLET | Freq: Every evening | ORAL | Status: DC | PRN
Start: 2015-06-22 — End: 2015-06-26
  Filled 2015-06-22: qty 1

## 2015-06-22 MED ORDER — HYDROMORPHONE HCL 1 MG/ML IJ SOLN
0.5000 mg | INTRAMUSCULAR | Status: DC | PRN
  Administered 2015-06-24: 1 mg via INTRAVENOUS
  Filled 2015-06-22: qty 1

## 2015-06-22 MED ORDER — PIPERACILLIN-TAZOBACTAM 3.375 G IVPB 30 MIN: 3.3750 g | Freq: Three times a day (TID) | INTRAVENOUS | Status: DC

## 2015-06-22 MED ORDER — VITAMIN B-12 1000 MCG PO TABS
1000.0000 ug | ORAL_TABLET | Freq: Every day | ORAL | Status: DC
  Administered 2015-06-22 – 2015-06-26 (×5): 1000 ug via ORAL
  Filled 2015-06-22 (×5): qty 1

## 2015-06-22 MED ORDER — PRUTECT EX EMUL
1.0000 "application " | Freq: Every day | CUTANEOUS | Status: DC | PRN
  Administered 2015-06-23: 1 via TOPICAL
  Filled 2015-06-22 (×2): qty 45

## 2015-06-22 MED ORDER — TRAZODONE HCL 50 MG PO TABS: 100.0000 mg | ORAL_TABLET | Freq: Every evening | ORAL | Status: DC | PRN

## 2015-06-22 MED ORDER — PIPERACILLIN-TAZOBACTAM 3.375 G IVPB 30 MIN
3.3750 g | Freq: Once | INTRAVENOUS | Status: AC
  Administered 2015-06-22: 3.375 g via INTRAVENOUS
  Filled 2015-06-22: qty 50

## 2015-06-22 MED ORDER — SODIUM CHLORIDE 0.9 % IV BOLUS (SEPSIS)
1000.0000 mL | Freq: Once | INTRAVENOUS | Status: AC
  Administered 2015-06-22: 1000 mL via INTRAVENOUS

## 2015-06-22 MED ORDER — ONDANSETRON HCL 4 MG/2ML IJ SOLN: 4.0000 mg | Freq: Four times a day (QID) | INTRAMUSCULAR | Status: DC | PRN

## 2015-06-22 MED ORDER — SODIUM CHLORIDE 0.9 % IV BOLUS (SEPSIS)
1000.0000 mL | INTRAVENOUS | Status: AC
  Administered 2015-06-22 (×2): 1000 mL via INTRAVENOUS

## 2015-06-22 NOTE — Progress Notes (Signed)
Pamela Mcguire   DOB:Dec 15, 1934   MW#:[id number]   UVO#:[id number]  Patient Care Team: [name], MD as PCP - General (Nurse Practitioner)  Subjective: Pamela Mcguire is a 80 year old woman with a history of Stage 3 Non small cell carcinoma of the lung, admitted with SIRS. She presented with acute onset of fevers up to 100, shortness of breath, chills and night sweats, mild hemoptysis.Denies any chest pain or palpitations. Denies lower extremity swelling. Appetite is decreasd. Denies nausea, heartburn or change in bowel habits.Denies any dysuria. Denies abnormal skin rashes, or neuropathy. Denies any bleeding issues such as hematemesis, hematuria or hematochezia. She had mild epistaxis in the setting of Eliquis which she takes for chronic atrial fibrillation.CTA chest was negative for PE, with reduction of the tumor. She was placed on IV antibiotics, O2. Steroids, IVF. Cultures are pending. We were kindly informed of the patient's admission.  DIAGNOSIS: Non-small cell carcinoma of lung, stage 3  Staging form: Lung, AJCC 7th Edition  Clinical stage from 04/30/2015: Stage IIIA (T1a, N2, M0) - Signed by Curt Bears, MD on 04/30/2015  PRIOR THERAPY: None  CURRENT THERAPY: Concurrent chemoradiation with chemotherapy in the form of weekly carboplatin for an AUC of 2 and paclitaxel at 45 mg/m given concurrent with radiation.  Scheduled Meds: . apixaban  5 mg Oral BID  . escitalopram  10 mg Oral Daily  . ezetimibe-simvastatin  1 tablet Oral Daily  . FLORA-Q  1 capsule Oral BID  . LORazepam  1 mg Oral 3 times per day  . metoprolol tartrate  25 mg Oral BID  . pantoprazole  40 mg Oral Daily  . piperacillin-tazobactam (ZOSYN)  IV  3.375 g Intravenous 3 times per day  . sucralfate  1 g Oral TID WC & HS  . vancomycin  750 mg Intravenous Q12H  . vitamin B-12  1,000 mcg Oral Daily   Continuous Infusions: . sodium chloride 75 mL (06/22/15 0400)   PRN Meds:.acetaminophen **OR** acetaminophen, alum & mag  hydroxide-simeth, HYDROmorphone (DILAUDID) injection, ipratropium-albuterol, loratadine, ondansetron **OR** ondansetron (ZOFRAN) IV, oxyCODONE, PRUTECT, traZODone  Objective:  Filed Vitals:   06/22/15 0800  BP: 171/57  Pulse: 94  Temp:   Resp: 25      Intake/Output Summary (Last 24 hours) at 06/22/15 0837 Last data filed at 06/22/15 0800  Gross per 24 hour  Intake 2462.5 ml  Output      0 ml  Net 2462.5 ml    ECOG PERFORMANCE STATUS:  GENERAL:alert, ill appearing SKIN: skin color, texture, turgor are normal, no rashes or significant lesions EYES: normal, conjunctiva are pink and non-injected, sclera clear OROPHARYNX:no exudate, no erythema and lips, buccal mucosa, and tongue normal  NECK: supple, thyroid normal size, non-tender, without nodularity LYMPH:  no palpable lymphadenopathy in the cervical, axillary or inguinal LUNGS: clear to auscultation and percussion with normal breathing effort HEART: irregular rate & rhythm and no murmurs and no lower extremity edema ABDOMEN: soft, non-tender and normal bowel sounds Musculoskeletal:no cyanosis of digits and no clubbing  PSYCH: alert & oriented x 3 with fluent speech NEURO: no focal motor/sensory deficits     Labs:   Recent Labs Lab 06/15/15 1154 06/21/15 2233 06/22/15 0400  WBC 4.3 3.0* 1.9*  HGB 11.7 10.6* 10.2*  HCT 35.7 32.1* 30.8*  PLT 194 191 173  MCV 86.4 85.4 89.0  MCH 28.3 28.2 29.5  MCHC 32.7 33.0 33.1  RDW 14.7* 14.8 15.1  LYMPHSABS 0.4* 0.5*  --   MONOABS 0.2 0.1  --  EOSABS 0.1 0.0  --   BASOSABS 0.0 0.0  --      Chemistries:    Recent Labs Lab 06/15/15 1154 06/21/15 2233 06/22/15 0400  NA 137 135 137  K 4.2 3.7 3.7  CL  --  104 110  CO2 '24 24 24  '$ GLUCOSE 124 137* 121*  BUN 12.0 17 14  CREATININE 0.8 0.98 0.84  CALCIUM 9.3 8.6* 7.5*  AST 16 16  --   ALT 18 15  --   ALKPHOS 38* 34*  --   BILITOT 0.41 0.6  --     GFR Estimated Creatinine Clearance: 58.6 mL/min (by C-G formula  based on Cr of 0.84).  Liver Function Tests:  Recent Labs Lab 06/15/15 1154 06/21/15 2233  AST 16 16  ALT 18 15  ALKPHOS 38* 34*  BILITOT 0.41 0.6  PROT 6.2* 6.1*  ALBUMIN 3.1* 3.4*    Urine Studies     Component Value Date/Time   COLORURINE AMBER* 06/21/2015 2231   APPEARANCEUR CLOUDY* 06/21/2015 2231   LABSPEC 1.026 06/21/2015 2231   LABSPEC 1.010 06/02/2015 1706   PHURINE 6.5 06/21/2015 2231   PHURINE 6.0 06/02/2015 1706   GLUCOSEU NEGATIVE 06/21/2015 2231   GLUCOSEU Negative 06/02/2015 1706   HGBUR MODERATE* 06/21/2015 2231   HGBUR Large 06/02/2015 1706   BILIRUBINUR SMALL* 06/21/2015 2231   BILIRUBINUR Negative 06/02/2015 1706   KETONESUR NEGATIVE 06/21/2015 2231   KETONESUR Negative 06/02/2015 1706   PROTEINUR NEGATIVE 06/21/2015 2231   PROTEINUR Negative 06/02/2015 1706   UROBILINOGEN 0.2 06/21/2015 2231   UROBILINOGEN 0.2 06/02/2015 1706   NITRITE NEGATIVE 06/21/2015 2231   NITRITE Negative 06/02/2015 1706   LEUKOCYTESUR MODERATE* 06/21/2015 2231   LEUKOCYTESUR Moderate 06/02/2015 1706    Coagulation profile  Recent Labs Lab 06/22/15 0400  INR 1.28    Cardiac Enzymes:  Recent Labs Lab 06/21/15 2233  TROPONINI <0.03  Microbiology Cultures pending   Imaging Studies:  Dg Chest 2 View  06/21/2015   CLINICAL DATA:  Lung cancer. Chest pains today. Dyspnea. Tachypnea.  EXAM: CHEST  2 VIEW  COMPARISON:  05/13/2015  FINDINGS: The right upper lobe mass is unchanged. There is no evidence of a superimposed acute infiltrate or congestive heart failure. Pulmonary vasculature is normal. There are no pleural effusions. Heart size is normal and unchanged.  IMPRESSION: Known right upper lobe mass.  No superimposed acute findings.   Electronically Signed   By: Andreas Newport M.D.   On: 06/21/2015 21:21   Ct Angio Chest Pe W/cm &/or Wo Cm  06/22/2015   CLINICAL DATA:  Tachycardia and dyspnea. Ongoing chemotherapy for lung cancer.  EXAM: CT ANGIOGRAPHY CHEST  WITH CONTRAST  TECHNIQUE: Multidetector CT imaging of the chest was performed using the standard protocol during bolus administration of intravenous contrast. Multiplanar CT image reconstructions and MIPs were obtained to evaluate the vascular anatomy.  CONTRAST:  123m OMNIPAQUE IOHEXOL 350 MG/ML SOLN  COMPARISON:  PET 04/10/2015  FINDINGS: Cardiovascular: There is good opacification of the pulmonary arteries. There is no pulmonary embolism. The thoracic aorta is normal in caliber and intact. There are atherosclerotic calcifications of the aorta and Coronary arteries.  Lungs: The right apical lung mass is slightly smaller, measuring 8.4 x 16.9 mm and previously measuring 12.9 x 18.1 mm on 04/10/2015. Chronic fibrotic appearing interstitial coarsening is again evident. No superimposed acute disease is evident.  Central airways: Patent  Effusions: None  Lymphadenopathy: Unchanged mediastinal and right hilar adenopathy  Esophagus: Unremarkable  Upper abdomen: Small hiatal hernia  Musculoskeletal: No significant skeletal lesion. Moderate kyphosis and degenerative disc disease.  Review of the MIP images confirms the above findings.  IMPRESSION: 1. Negative for acute pulmonary embolism. There is moderate atherosclerotic calcification of the aorta and Coronary arteries. 2. Mild size reduction of the right apical lung mass. Unchanged mediastinal and hilar adenopathy. 3. No acute findings are evident.   Electronically Signed   By: Andreas Newport M.D.   On: 06/22/2015 01:06    Assessment/Plan: 80 y.o.   Stage IIIa non-small cell lung cancer.  On concurrent chemoradiation with chemotherapy in the form of carboplatin and paclitaxel weekly concurrent with radiation, last dose on 7/22 May continue radiation while in the hospital if Parker agrees, and chemo when clinically stable (she was due for chemo today)  SIRS(systemic inflammatory response syndrome) Patient was transferred to the ICU for sepsis workup She was  placed on Vanco and Zosyn, IVF, O2. Antipyretics with good response. Cultures pending Appreciate admitting team's involvement.  Anemia Due to recent chemotherapy and current sepsis No transfusion is indicated at this time Monitor counts closely Transfuse blood to maintain a Hb of 8 g or if the patient is acutely bleeding  Leukopenia Due to recent chemotherapy, dilution, infection,antibiotics  Current WBC 1.9. Check ANC Will consider Granix if ANC is less than 1.0 She on Solumedrol 125 mg IV fo respiratory issues, which may stabilize her leukopenia Continue to closely monitor  DVT prophylaxis On Eliquis  Full Code   Other medical issues as per admitting team     Rondel Jumbo, PA-C 06/22/2015  8:37 AM  ADDENDUM: Hematology/Oncology Attending: The patient is seen and examined. I agree with the above note. She is a very pleasant 80 years old white female with a stage IIIa non-small cell lung cancer who is currently undergoing concurrent chemoradiation with weekly carboplatin and paclitaxel last dose of chemotherapy was given on 06/15/2015. The patient was admitted to [facility] yesterday complaining of fever as well as shortness of breath and mild hemoptysis. She also had mild epistaxis but she is currently on Eliquis for chronic atrial fibrillation. She was admitted to the ICU for treatment of questionable sepsis. The patient was started on IV vancomycin and Zosyn in addition to IV fluids. She is feeling a little bit better today. On admission she had mild leukocytopenia likely secondary to recent chemotherapy as well as sepsis.  The patient is feeling much better and will continue to monitor her closely. We will consider her for treatment with Granix if she has absolute neutrophil count less than 500. Continue current antibiotics as well as IV hydration. We will hold her chemotherapy for today. Thank you for taking good care of Pamela. Mcguire, I will continue to follow up the  patient with you and assist in her management an as-needed basis.

## 2015-06-22 NOTE — Progress Notes (Signed)
Report called to [facility] Therapist, sports.  Patient transferring to room 1416.

## 2015-06-22 NOTE — Telephone Encounter (Signed)
Charlesetta Ivory, RN on ICU Stepdown for report on Pamela Mcguire.  Per Levada Dy, she does not have any IV gtts going and is using her home level of 2L of oxygen via nasal canula.  She will be transferred to another floor today.  Per Dr. Tammi Klippel she is OK to treat today.  Notified [place] 1.

## 2015-06-22 NOTE — Progress Notes (Signed)
Patient transferred from ICU. Has blanchable redness over buttocks. Redness on back d/t radiation.  Pamela Mcguire. Brigitte Pulse, RN

## 2015-06-22 NOTE — Progress Notes (Signed)
Oncology Nurse Navigator Documentation  Oncology Nurse Navigator Flowsheets 06/22/2015  Navigator Encounter Type Treatment  Patient Visit Type Inpatient/Patient was scheduled to see Awilda Metro at [facility] today but was admitted and in the stepdown unit at [facility].  I went to see her.  Her daughter was in the room and patient just got back from [place] tx.  All questions and concerns were address by Dr. Julien Nordmann, no other barriers identified.   Treatment Phase Treatment  Barriers/Navigation Needs Education  Education Understanding Cancer/ Treatment Options  Interventions Education Method  Time Spent with Patient 30

## 2015-06-22 NOTE — Care Management Note (Signed)
Case Management Note  Patient Details  Name: Pamela Mcguire MRN: [id number] Date of Birth: 03/29/1935  Subjective/Objective:                 sepsis   Action/Plan: tbd   Expected Discharge Date:       [id number]           Expected Discharge Plan:  Home/Self Care  In-House Referral:  Clinical Social Work  Discharge planning Services  CM Consult  Post Acute Care Choice:  NA Choice offered to:  NA  DME Arranged:  N/A DME Agency:  NA  HH Arranged:  NA HH Agency:  NA  Status of Service:  In process, will continue to follow  Medicare Important Message Given:    Date Medicare IM Given:    Medicare IM give by:    Date Additional Medicare IM Given:    Additional Medicare Important Message give by:     If discussed at [place] of Stay Meetings, dates discussed:    Additional Comments:  Leeroy Cha, RN 06/22/2015, 9:42 AM

## 2015-06-22 NOTE — ED Notes (Signed)
Called unit and talked to Network engineer. Patient will be wheeled up in 20 min.

## 2015-06-22 NOTE — Plan of Care (Signed)
Problem: Phase I Progression Outcomes Goal: Pain controlled with appropriate interventions Outcome: Not Applicable Date Met:  23/36/12 Denies pain.

## 2015-06-22 NOTE — H&P (Signed)
[facility] Admission History and Physical       Pamela Mcguire PFX:[id number] DOB: 1935-03-12 DOA: 06/21/2015  Referring physician: EDP PCP: [name], MD  Specialists:   Chief Complaint: SOB and Fever  HPI: Pamela Mcguire is a 80 y.o. female with a history of [place] Lung Cancer Stage III on chemotherapy and Radiation Rx Dx in 03/2015 and last Chemotherapy was Monday 07/18 and last Radiation Rx was on Friday 07/22 who presents to the ED with complaints of increased SOB,and Sweats over the past 24 hours.   She reports having some mild hemoptysis.   In the ED, a CTA of the Chest and chest X-ray were performed and the CTA of the Chest was negative for pulmonary emboli.   She was found to have a temperature of 100.1, and a Sepsis Workup was initiated.  She was placed on IV Vancomycin and Zosyn ad referred for admission  Of Note: She has Atrial fibrillation and is on Eliquis Rx.        Review of Systems:  Constitutional: No Weight Loss, No Weight Gain, Night Sweats, Fevers, Chills, Dizziness, Light Headedness, Fatigue, or Generalized Weakness HEENT: No Headaches, Difficulty Swallowing,Tooth/Dental Problems,Sore Throat,  No Sneezing, Rhinitis, Ear Ache, Nasal Congestion, or Post Nasal Drip,  Cardio-vascular:  No Chest pain, Orthopnea, PND, Edema in Lower Extremities, Anasarca, Dizziness, Palpitations  Resp: +Dyspnea, No DOE, No Productive Cough, No Non-Productive Cough, +Hemoptysis, No Wheezing.    GI: No Heartburn, Indigestion, Abdominal Pain, Nausea, Vomiting, Diarrhea, Constipation, Hematemesis, Hematochezia, Melena, Change in Bowel Habits,  +Loss of Appetite  GU: No Dysuria, No Change in Color of Urine, No Urgency or Urinary Frequency, No Flank pain.  Musculoskeletal: No Joint Pain or Swelling, No Decreased Range of Motion, No Back Pain.  Neurologic: No Syncope, No Seizures, Muscle Weakness, Paresthesia, Vision Disturbance or Loss, No Diplopia, No Vertigo, No Difficulty Walking,  Skin:  No Rash or Lesions. Psych: No Change in Mood or Affect, No Depression or Anxiety, No Memory loss, No Confusion, or Hallucinations   Past Medical History  Diagnosis Date  . Hyperlipidemia   . Diverticulitis   . CAD (coronary artery disease)   . AF (atrial fibrillation)   . Hypertension   . Seasonal allergies   . Prediabetes   . Depression   . Shortness of breath dyspnea   . Anxiety   . Urinary frequency   . GERD (gastroesophageal reflux disease)   . Breast cancer     right mastectomy  . Sleep apnea      dx'd 10/2014, has not received cpap yet (04/28/2015)  . Arthritis     "thighs, hips, arms" (04/28/2015)  . Non-small cell carcinoma of lung, stage 3 04/30/2015     Past Surgical History  Procedure Laterality Date  . Cardioversion    . Tonsillectomy    . Colonoscopy    . Cesarean section  X 2  . Mastectomy Right   . Breast surgery    . Mediastinoscopy N/A 04/28/2015    Procedure: MEDIASTINOSCOPY;  Surgeon: [name], MD;  Location: [facility] OR;  Service: Thoracic;  Laterality: N/A;      Prior to Admission medications   Medication Sig Start Date End Date Taking? Authorizing Provider  acetaminophen (TYLENOL) 500 MG tablet Take 500-1,000 mg by mouth every 6 (six) hours as needed for mild pain or headache.   Yes Historical Provider, MD  albuterol (PROVENTIL HFA;VENTOLIN HFA) 108 (90 BASE) MCG/ACT inhaler Inhale 1 puff into the lungs every 6 (  six) hours as needed for wheezing or shortness of breath.   Yes Historical Provider, MD  Cholecalciferol (VITAMIN D PO) Take 1 tablet by mouth every morning.    Yes Historical Provider, MD  Cyanocobalamin (VITAMIN B-12 PO) Take 1 capsule by mouth every morning.   Yes Historical Provider, MD  ELIQUIS 5 MG TABS tablet TAKE 1 TABLET BY MOUTH TWICE DAILY 01/27/15  Yes Pixie Casino, MD  escitalopram (LEXAPRO) 10 MG tablet Take 1 tablet (10 mg total) by mouth daily. 01/06/15  Yes Pixie Casino, MD  ezetimibe-simvastatin (VYTORIN) 10-40 MG per  tablet Take 1 tablet by mouth daily. 03/04/15  Yes Pixie Casino, MD  HYDROcodone-acetaminophen (NORCO/VICODIN) 5-325 MG per tablet Take 1 tablet by mouth every 6 (six) hours as needed for moderate pain. 05/14/15  Yes Curt Bears, MD  loratadine (CLARITIN) 10 MG tablet Take 10 mg by mouth at bedtime as needed for allergies.   Yes Historical Provider, MD  LORazepam (ATIVAN) 1 MG tablet Take 1 tablet (1 mg total) by mouth every 8 (eight) hours. Patient taking differently: Take 1 mg by mouth at bedtime.  05/27/15  Yes Tyler Pita, MD  metoprolol tartrate (LOPRESSOR) 25 MG tablet Take 1 tablet (25 mg total) by mouth 2 (two) times daily. 01/05/15  Yes Pixie Casino, MD  mometasone-formoterol (DULERA) 100-5 MCG/ACT AERO Inhale 2 puffs into the lungs 2 (two) times daily as needed for wheezing.    Yes [name], MD  pantoprazole (PROTONIX) 40 MG tablet Take 40 mg by mouth daily.   Yes Historical Provider, MD  Probiotic Product (PROBIOTIC PO) Take 1 capsule by mouth 2 (two) times daily.   Yes Historical Provider, MD  prochlorperazine (COMPAZINE) 10 MG tablet Take 1 tablet (10 mg total) by mouth every 6 (six) hours as needed for nausea or vomiting. 04/30/15  Yes Adrena E Johnson, PA-C  sucralfate (CARAFATE) 1 G tablet Take 1 tablet (1 g total) by mouth 4 (four) times daily -  with meals and at bedtime. 5 min before meals for radiation induced esophagitis 05/21/15  Yes Tyler Pita, MD  traZODone (DESYREL) 100 MG tablet Take 1 tablet (100 mg total) by mouth at bedtime. Patient taking differently: Take 100 mg by mouth at bedtime as needed for sleep.  05/11/15  Yes Curt Bears, MD  emollient (BIAFINE) cream Apply topically as needed. Patient taking differently: Apply 1 application topically daily as needed.  06/09/15   Tyler Pita, MD  erythromycin [facility]) ophthalmic ointment Place 1 application into both eyes 3 (three) times daily. Patient not taking: Reported on 06/22/2015 06/02/15   Susanne Borders, NP  Omega-3 Fatty Acids (FISH OIL) 1200 MG CAPS Take 2 capsules by mouth daily.    Historical Provider, MD  oxyCODONE (ROXICODONE) 5 MG immediate release tablet Take 1 tablet (5 mg total) by mouth every 4 (four) hours as needed for severe pain. Patient not taking: Reported on 06/22/2015 04/29/15   Nani Skillern, PA-C  Wound Cleansers (RADIAPLEX EX) Apply topically.    Historical Provider, MD     No Known Allergies  Social History:  reports that she quit smoking about 22 years ago. Her smoking use included Cigarettes. She has a 35 pack-year smoking history. She has never used smokeless tobacco. She reports that she drinks about 5.4 oz of alcohol per week. She reports that she does not use illicit drugs.    History reviewed. No pertinent family history.     Physical Exam:  GEN:  Pleasant Obese Elderly 80 y.o. Caucasian female examined and in no acute distress; cooperative with exam Filed Vitals:   06/21/15 2330 06/22/15 0000 06/22/15 0041 06/22/15 0125  BP: 120/79 129/57 121/94 121/94  Pulse: 91 92 92 91  Temp:    97.9 F (36.6 C)  TempSrc:    Oral  Resp: 19 37 17 19  Height:      Weight:      SpO2: 96% 97% 99% 100%   Blood pressure 121/94, pulse 91, temperature 97.9 F (36.6 C), temperature source Oral, resp. rate 19, height '5\' 5"'$  (1.651 m), weight 82.555 kg (182 lb), SpO2 100 %. PSYCH: She is alert and oriented x4; does not appear anxious does not appear depressed; affect is normal HEENT: Normocephalic and Atraumatic, Mucous membranes pink; PERRLA; EOM intact; Fundi:  Benign;  No scleral icterus, Nares: Patent, Oropharynx: Clear, Edentulous with Upper Denture and Lower Partial Present,    Neck:  FROM, No Cervical Lymphadenopathy nor Thyromegaly or Carotid Bruit; No JVD; Breasts:: Not examined CHEST WALL: No tenderness CHEST: Tachypneic, Decreased Breath Sounds No Rales No Rhonchi No Wheezes HEART: Mildly Tachycardic  And Regular Rhythm; no murmurs rubs or  gallops BACK: No kyphosis or scoliosis; No CVA tenderness ABDOMEN: Positive Bowel Sounds, Obese, Soft Non-Tender, No Rebound or Guarding; No Masses, No Organomegaly, No Pannus; No Intertriginous candida. Rectal Exam: Not done EXTREMITIES: No Cyanosis, Clubbing, or Edema; No Ulcerations. Genitalia: not examined PULSES: 2+ and symmetric SKIN: Normal hydration no rash or ulceration CNS:  Alert and Oriented x 4, No Focal Deficits Vascular: pulses palpable throughout    Labs on Admission:  Basic Metabolic Panel:  Recent Labs Lab 06/15/15 1154 06/21/15 2233  NA 137 135  K 4.2 3.7  CL  --  104  CO2 24 24  GLUCOSE 124 137*  BUN 12.0 17  CREATININE 0.8 0.98  CALCIUM 9.3 8.6*   Liver Function Tests:  Recent Labs Lab 06/15/15 1154 06/21/15 2233  AST 16 16  ALT 18 15  ALKPHOS 38* 34*  BILITOT 0.41 0.6  PROT 6.2* 6.1*  ALBUMIN 3.1* 3.4*   No results for input(s): LIPASE, AMYLASE in the last 168 hours. No results for input(s): AMMONIA in the last 168 hours. CBC:  Recent Labs Lab 06/15/15 1154 06/21/15 2233  WBC 4.3 3.0*  NEUTROABS 3.6 2.4  HGB 11.7 10.6*  HCT 35.7 32.1*  MCV 86.4 85.4  PLT 194 191   Cardiac Enzymes:  Recent Labs Lab 06/21/15 2233  TROPONINI <0.03    BNP (last 3 results) No results for input(s): BNP in the last 8760 hours.  ProBNP (last 3 results) No results for input(s): PROBNP in the last 8760 hours.  CBG: No results for input(s): GLUCAP in the last 168 hours.  Radiological Exams on Admission: Dg Chest 2 View  06/21/2015   CLINICAL DATA:  Lung cancer. Chest pains today. Dyspnea. Tachypnea.  EXAM: CHEST  2 VIEW  COMPARISON:  05/13/2015  FINDINGS: The right upper lobe mass is unchanged. There is no evidence of a superimposed acute infiltrate or congestive heart failure. Pulmonary vasculature is normal. There are no pleural effusions. Heart size is normal and unchanged.  IMPRESSION: Known right upper lobe mass.  No superimposed acute  findings.   Electronically Signed   By: Andreas Newport M.D.   On: 06/21/2015 21:21   Ct Angio Chest Pe W/cm &/or Wo Cm  06/22/2015   CLINICAL DATA:  Tachycardia and dyspnea. Ongoing chemotherapy for lung cancer.  EXAM: CT ANGIOGRAPHY CHEST WITH CONTRAST  TECHNIQUE: Multidetector CT imaging of the chest was performed using the standard protocol during bolus administration of intravenous contrast. Multiplanar CT image reconstructions and MIPs were obtained to evaluate the vascular anatomy.  CONTRAST:  169m OMNIPAQUE IOHEXOL 350 MG/ML SOLN  COMPARISON:  PET 04/10/2015  FINDINGS: Cardiovascular: There is good opacification of the pulmonary arteries. There is no pulmonary embolism. The thoracic aorta is normal in caliber and intact. There are atherosclerotic calcifications of the aorta and Coronary arteries.  Lungs: The right apical lung mass is slightly smaller, measuring 8.4 x 16.9 mm and previously measuring 12.9 x 18.1 mm on 04/10/2015. Chronic fibrotic appearing interstitial coarsening is again evident. No superimposed acute disease is evident.  Central airways: Patent  Effusions: None  Lymphadenopathy: Unchanged mediastinal and right hilar adenopathy  Esophagus: Unremarkable  Upper abdomen: Small hiatal hernia  Musculoskeletal: No significant skeletal lesion. Moderate kyphosis and degenerative disc disease.  Review of the MIP images confirms the above findings.  IMPRESSION: 1. Negative for acute pulmonary embolism. There is moderate atherosclerotic calcification of the aorta and Coronary arteries. 2. Mild size reduction of the right apical lung mass. Unchanged mediastinal and hilar adenopathy. 3. No acute findings are evident.   Electronically Signed   By: DAndreas NewportM.D.   On: 06/22/2015 01:06     EKG: Independently reviewed. Sinus Tachycardia Rate =106     Assessment/Plan:     80y.o. female with  Principal Problem:   1.   SIRS (systemic inflammatory response syndrome)   Sepsis Workup  inititated   IV Vancomycin and Zosyn   IVFs   Active Problems:   2.   Tachypnea- due to #9, and /or anxiety   O2 PRN   Monitor O2 sats     3.   Anemia- due to Chemo Rx   Monitor Trend     4.   Leukopenia due to antineoplastic chemotherapy   Monitor Trend     5.   Hypocalcemia   Replace     6.   Paroxysmal a-fib   Continue Metoprolol as BP and HR Tolerate   Continue Eliquis Rx     7.   HTN (hypertension)   Continue Metoprolol as BP Tolerates      8.   Long term current use of antiarrhythmic drug   On Eliquis Rx     9.   Adenocarcinoma of upper lobe of right lung   Notify Oncology and Radiation Oncology in AM     10. DVT Prophylaxis   On Elliquis Rx    Code Status:     FULL CODE     Family Communication:   2 Daughters at Bedside    Disposition Plan:    Inpatient  Status        Time spent:  71Minutes      JTheressa MillardTri[facility] Hospitalists Pager [phone]  If 7GainesPlease Contact the Day Rounding Team MD for [facility]  If 7PM-7AM, Please Contact Night-Floor Coverage  [email or web address] Password [email or web address] 06/22/2015, 1:42 AM     ADDENDUM:   Patient was seen and examined on 06/22/2015

## 2015-06-22 NOTE — Progress Notes (Signed)
ANTIBIOTIC CONSULT NOTE - INITIAL  Pharmacy Consult for Vancomycin and Zosyn  Indication: sepsis  No Known Allergies  Patient Measurements: Height: '5\' 5"'$  (165.1 cm) Weight: 182 lb (82.555 kg) IBW/kg (Calculated) : 57 Adjusted Body Weight:   Vital Signs: Temp: 97.9 F (36.6 C) (07/25 0125) Temp Source: Oral (07/25 0125) BP: 117/64 mmHg (07/25 0200) Pulse Rate: 92 (07/25 0200) Intake/Output from previous day: 07/24 0701 - 07/25 0700 In: 1050 [IV Piggyback:1050] Out: -  Intake/Output from this shift: Total I/O In: 1050 [IV Piggyback:1050] Out: -   Labs:  Recent Labs  06/21/15 2233  WBC 3.0*  HGB 10.6*  PLT 191  CREATININE 0.98   Estimated Creatinine Clearance: 50.2 mL/min (by C-G formula based on Cr of 0.98). No results for input(s): VANCOTROUGH, VANCOPEAK, VANCORANDOM, GENTTROUGH, GENTPEAK, GENTRANDOM, TOBRATROUGH, TOBRAPEAK, TOBRARND, AMIKACINPEAK, AMIKACINTROU, AMIKACIN in the last 72 hours.   Microbiology: Recent Results (from the past 720 hour(s))  Urine Culture     Status: None   Collection Time: 06/02/15  5:06 PM  Result Value Ref Range Status   Urine Culture, Routine Culture, Urine  Final    Comment: Final - ===== COLONY COUNT: ===== >=100,000 COLONIES/ML PROTEUS MIRABILIS  ------------------------------------------------------------------------  PROTEUS MIRABILIS     AMPICILLIN                       MIC      Sensitive        <=2 ug/ml    AMOX/CLAVULANIC                  MIC      Sensitive          4 ug/ml    AMPICILLIN/SUL                   MIC      Sensitive        <=2 ug/ml    PIPERACILLIN/TAZO                MIC      Sensitive        <=4 ug/ml    IMIPENEM                         MIC      Indeterminate      8 ug/ml    CEFAZOLIN                        MIC      Sensitive          8 ug/ml    CEFTRIAXONE                      MIC      Sensitive        <=1 ug/ml    CEFTAZIDIME                      MIC      Sensitive        <=1 ug/ml    CEFEPIME                          MIC      Sensitive        <=1 ug/ml    GENTAMICIN  MIC      Sensitive        <=1 ug/ml    TOBRAMYCIN                       MIC      Sensitive        <=1 ug/ml    CIPROFLOXACIN                     MIC      Sensitive     <=0.25 ug/ml    LEVOFLOXACIN                     MIC      Sensitive     <=0.12 ug/ml    NITROFURANTOIN                   MIC      Resistant        128 ug/ml    TRIMETH/SULFA                    MIC      Sensitive       <=20 ug/ml END OF REPORT     Medical History: Past Medical History  Diagnosis Date  . Hyperlipidemia   . Diverticulitis   . CAD (coronary artery disease)   . AF (atrial fibrillation)   . Hypertension   . Seasonal allergies   . Prediabetes   . Depression   . Shortness of breath dyspnea   . Anxiety   . Urinary frequency   . GERD (gastroesophageal reflux disease)   . Breast cancer     right mastectomy  . Sleep apnea      dx'd 10/2014, has not received cpap yet (04/28/2015)  . Arthritis     "thighs, hips, arms" (04/28/2015)  . Non-small cell carcinoma of lung, stage 3 04/30/2015    Medications:  Anti-infectives    Start     Dose/Rate Route Frequency Ordered Stop   06/22/15 1800  vancomycin (VANCOCIN) IVPB 750 mg/150 ml premix     750 mg 150 mL/hr over 60 Minutes Intravenous Every 12 hours 06/22/15 0219     06/22/15 0600  piperacillin-tazobactam (ZOSYN) IVPB 3.375 g  Status:  Discontinued     3.375 g 100 mL/hr over 30 Minutes Intravenous 3 times per day 06/22/15 0156 06/22/15 0211   06/22/15 0600  piperacillin-tazobactam (ZOSYN) IVPB 3.375 g     3.375 g 12.5 mL/hr over 240 Minutes Intravenous 3 times per day 06/22/15 0211     06/22/15 0200  piperacillin-tazobactam (ZOSYN) IVPB 3.375 g  Status:  Discontinued     3.375 g 100 mL/hr over 30 Minutes Intravenous  Once 06/22/15 0155 06/22/15 0156   06/22/15 0015  vancomycin (VANCOCIN) 1,500 mg in sodium chloride 0.9 % 500 mL IVPB     1,500 mg 250 mL/hr over 120  Minutes Intravenous  Once 06/22/15 0002     06/22/15 0015  piperacillin-tazobactam (ZOSYN) IVPB 3.375 g     3.375 g 100 mL/hr over 30 Minutes Intravenous  Once 06/22/15 0002 06/22/15 0121     Assessment: Patient with sepsis.  First dose of antibiotics already given.  No H&P recorded yet.  Goal of Therapy:  Zosyn based on renal function  Vancomycin trough level 15-20 mcg/ml  Plan:  Measure antibiotic drug levels at steady state Follow up culture results Vancomycin '750mg'$  iv q12hr  Zosyn 3.375g IV  Q8H infused over 4hrs.   Tyler Deis, Shea Stakes [place] 06/22/2015,2:21 AM

## 2015-06-22 NOTE — Progress Notes (Signed)
[facility] PROGRESS NOTE  Pamela Mcguire HKV:[id number] DOB: [birth year] DOA: 06/21/2015 PCP: Pamela Cliche, MD  Assessment/Plan: 1. SIRS, unclear etiology 1. Follow cultures 2. Tmax of 100.1 overnight, however pt remained afebrile afterwards 3. Pt is continued on empiric vanc and zosyn 2. Chronic anemia 1. Suspect related to cancer tx 2. No obvious signs of blood loss 3. Leukopenia 1. Consideration for Granix per Oncology for ANC<500 2. Monitor 4. Paroxysmal afib 1. Rate controlled. 2. Cont current regimen 5. HTN 1. BP stable 2. Cont current regimen 6. Long term anticoagulation 1. On eliquis for hx of afib 7. RUL adenocarcinoma 1. Followed by Dr. Julien Nordmann 2. Recs to hold chemo at this time. 3. Consideration for Granix if ANC <500 4. Have consulted Radiation Oncology. Pt reportedly  8. DVT prophylaxis 1. On eliquis per above  Code Status: Full Family Communication: Pt in room (indicate person spoken with, relationship, and if by phone, the number) Disposition Plan: Transfer to med-tele   Consultants:  Oncology 7/25>>>  Radiation Oncology 7/25>>>  Procedures:    Antibiotics:  Vancomycin   Zosyn  (indicate start date, and stop date if known)  HPI/Subjective: Feels better today. Denies SOB or pain  Objective: Filed Vitals:   06/22/15 1023 06/22/15 1133 06/22/15 1332 06/22/15 1442  BP:   159/69   Pulse:   79   Temp:  97.3 F (36.3 C)    TempSrc:  Oral    Resp: '22  21 20  '$ Height:      Weight:  84.6 kg (186 lb 8.2 oz)    SpO2: 94%  93% 98%    Intake/Output Summary (Last 24 hours) at 06/22/15 1656 Last data filed at 06/22/15 1400  Gross per 24 hour  Intake   3095 ml  Output    100 ml  Net   2995 ml   Filed Weights   06/21/15 2041 06/22/15 1133  Weight: 82.555 kg (182 lb) 84.6 kg (186 lb 8.2 oz)    Exam:   General:  Awake, in nad  Cardiovascular: regular, s1, s2  Respiratory: normal resp effort, no wheezing  Abdomen:  soft,nondistended  Musculoskeletal: perfused, no clubbing   Data Reviewed: Basic Metabolic Panel:  Recent Labs Lab 06/21/15 2233 06/22/15 0400  NA 135 137  K 3.7 3.7  CL 104 110  CO2 24 24  GLUCOSE 137* 121*  BUN 17 14  CREATININE 0.98 0.84  CALCIUM 8.6* 7.5*   Liver Function Tests:  Recent Labs Lab 06/21/15 2233  AST 16  ALT 15  ALKPHOS 34*  BILITOT 0.6  PROT 6.1*  ALBUMIN 3.4*   No results for input(s): LIPASE, AMYLASE in the last 168 hours. No results for input(s): AMMONIA in the last 168 hours. CBC:  Recent Labs Lab 06/21/15 2233 06/22/15 0400  WBC 3.0* 1.9*  NEUTROABS 2.4  --   HGB 10.6* 10.2*  HCT 32.1* 30.8*  MCV 85.4 89.0  PLT 191 173   Cardiac Enzymes:  Recent Labs Lab 06/21/15 2233  TROPONINI <0.03   BNP (last 3 results) No results for input(s): BNP in the last 8760 hours.  ProBNP (last 3 results) No results for input(s): PROBNP in the last 8760 hours.  CBG: No results for input(s): GLUCAP in the last 168 hours.  Recent Results (from the past 240 hour(s))  MRSA PCR Screening     Status: None   Collection Time: 06/22/15  2:41 AM  Result Value Ref Range Status   MRSA by PCR NEGATIVE NEGATIVE Final  Comment:        The GeneXpert MRSA Assay (FDA approved for NASAL specimens only), is one component of a comprehensive MRSA colonization surveillance program. It is not intended to diagnose MRSA infection nor to guide or monitor treatment for MRSA infections.      Studies: Dg Chest 2 View  06/21/2015   CLINICAL DATA:  Lung cancer. Chest pains today. Dyspnea. Tachypnea.  EXAM: CHEST  2 VIEW  COMPARISON:  05/13/2015  FINDINGS: The right upper lobe mass is unchanged. There is no evidence of a superimposed acute infiltrate or congestive heart failure. Pulmonary vasculature is normal. There are no pleural effusions. Heart size is normal and unchanged.  IMPRESSION: Known right upper lobe mass.  No superimposed acute findings.    Electronically Signed   By: Andreas Newport M.D.   On: 06/21/2015 21:21   Ct Angio Chest Pe W/cm &/or Wo Cm  06/22/2015   CLINICAL DATA:  Tachycardia and dyspnea. Ongoing chemotherapy for lung cancer.  EXAM: CT ANGIOGRAPHY CHEST WITH CONTRAST  TECHNIQUE: Multidetector CT imaging of the chest was performed using the standard protocol during bolus administration of intravenous contrast. Multiplanar CT image reconstructions and MIPs were obtained to evaluate the vascular anatomy.  CONTRAST:  17m OMNIPAQUE IOHEXOL 350 MG/ML SOLN  COMPARISON:  PET 04/10/2015  FINDINGS: Cardiovascular: There is good opacification of the pulmonary arteries. There is no pulmonary embolism. The thoracic aorta is normal in caliber and intact. There are atherosclerotic calcifications of the aorta and Coronary arteries.  Lungs: The right apical lung mass is slightly smaller, measuring 8.4 x 16.9 mm and previously measuring 12.9 x 18.1 mm on 04/10/2015. Chronic fibrotic appearing interstitial coarsening is again evident. No superimposed acute disease is evident.  Central airways: Patent  Effusions: None  Lymphadenopathy: Unchanged mediastinal and right hilar adenopathy  Esophagus: Unremarkable  Upper abdomen: Small hiatal hernia  Musculoskeletal: No significant skeletal lesion. Moderate kyphosis and degenerative disc disease.  Review of the MIP images confirms the above findings.  IMPRESSION: 1. Negative for acute pulmonary embolism. There is moderate atherosclerotic calcification of the aorta and Coronary arteries. 2. Mild size reduction of the right apical lung mass. Unchanged mediastinal and hilar adenopathy. 3. No acute findings are evident.   Electronically Signed   By: DAndreas NewportM.D.   On: 06/22/2015 01:06    Scheduled Meds: . apixaban  5 mg Oral BID  . escitalopram  10 mg Oral Daily  . ezetimibe-simvastatin  1 tablet Oral Daily  . feeding supplement  1 Container Oral TID BM  . FLORA-Q  1 capsule Oral BID  .  LORazepam  1 mg Oral 3 times per day  . metoprolol tartrate  25 mg Oral BID  . pantoprazole  40 mg Oral Daily  . piperacillin-tazobactam (ZOSYN)  IV  3.375 g Intravenous 3 times per day  . sucralfate  1 g Oral TID WC & HS  . vancomycin  750 mg Intravenous Q12H  . vitamin B-12  1,000 mcg Oral Daily   Continuous Infusions: . sodium chloride 75 mL (06/22/15 0400)    Principal Problem:   SIRS (systemic inflammatory response syndrome) Active Problems:   Paroxysmal a-fib   HTN (hypertension)   Long term current use of antiarrhythmic drug   Adenocarcinoma of upper lobe of right lung   Tachypnea   Anemia   Leukopenia due to antineoplastic chemotherapy   Hypocalcemia   [facility], [facility] Pager [phone] If 7PM-7AM, please contact night-coverage at [email or web address],  password [facility] 06/22/2015, 4:56 PM  LOS: 0 days

## 2015-06-22 NOTE — Progress Notes (Signed)
Date:  June 22, 2015 U.R. performed for needs and level of care. Will continue to follow for Case Management needs.  Rhonda Davis, RN, BSN, CCM   [phone] 

## 2015-06-23 ENCOUNTER — Ambulatory Visit
Admission: RE | Admit: 2015-06-23 | Discharge: 2015-06-23 | Disposition: A | Discharge status: Home / Self Care | Payer: Medicare HMO | Source: Ambulatory Visit | Attending: Radiation Oncology | Admitting: Radiation Oncology

## 2015-06-23 LAB — COMPREHENSIVE METABOLIC PANEL
ALT: 15 U/L (ref 14–54)
AST: 16 U/L (ref 15–41)
Albumin: 2.9 g/dL — ABNORMAL LOW (ref 3.5–5.0)
Alkaline Phosphatase: 30 U/L — ABNORMAL LOW (ref 38–126)
Anion gap: 7 (ref 5–15)
BILIRUBIN TOTAL: 0.7 mg/dL (ref 0.3–1.2)
BUN: 6 mg/dL (ref 6–20)
CALCIUM: 8.4 mg/dL — AB (ref 8.9–10.3)
CHLORIDE: 107 mmol/L (ref 101–111)
CO2: 24 mmol/L (ref 22–32)
Creatinine, Ser: 0.69 mg/dL (ref 0.44–1.00)
GFR calc Af Amer: >60 mL/min (ref >60)
GFR calc non Af Amer: >60 mL/min (ref >60)
Glucose, Bld: 121 mg/dL — ABNORMAL HIGH (ref 65–99)
POTASSIUM: 3.2 mmol/L — AB (ref 3.5–5.1)
Sodium: 138 mmol/L (ref 135–145)
Total Protein: 5.8 g/dL — ABNORMAL LOW (ref 6.5–8.1)

## 2015-06-23 LAB — URINE CULTURE

## 2015-06-23 LAB — CBC
HCT: 30.8 % — ABNORMAL LOW (ref 36.0–46.0)
Hemoglobin: 10.3 g/dL — ABNORMAL LOW (ref 12.0–15.0)
MCH: 29.2 pg (ref 26.0–34.0)
MCHC: 33.4 g/dL (ref 30.0–36.0)
MCV: 87.3 fL (ref 78.0–100.0)
PLATELETS: 187 10*3/uL (ref 150–400)
RBC: 3.53 MIL/uL — ABNORMAL LOW (ref 3.87–5.11)
RDW: 15 % (ref 11.5–15.5)
WBC: 1.9 10*3/uL — AB (ref 4.0–10.5)

## 2015-06-23 MED ORDER — POTASSIUM CHLORIDE CRYS ER 20 MEQ PO TBCR
40.0000 meq | EXTENDED_RELEASE_TABLET | Freq: Once | ORAL | Status: AC
  Administered 2015-06-23: 40 meq via ORAL
  Filled 2015-06-23: qty 2

## 2015-06-23 MED ORDER — RISAQUAD PO CAPS
1.0000 | ORAL_CAPSULE | Freq: Two times a day (BID) | ORAL | Status: DC
  Administered 2015-06-23 – 2015-06-26 (×6): 1 via ORAL
  Filled 2015-06-23 (×7): qty 1

## 2015-06-23 NOTE — Care Management Note (Signed)
Case Management Note  Patient Details  Name: Triva Hueber MRN: [id number] Date of Birth: [birth year]  Subjective/Objective: Transfer from SDU.SIRS, afib w/rvr.PT cons placed-Await recommendation.                   Action/Plan:d/c plan home.   Expected Discharge Date:                  Expected Discharge Plan:  Home/Self Care  In-House Referral:  Clinical Social Work  Discharge planning Services  CM Consult  Post Acute Care Choice:  NA Choice offered to:  NA  DME Arranged:  N/A DME Agency:  NA  HH Arranged:  NA HH Agency:  NA  Status of Service:  In process, will continue to follow  Medicare Important Message Given:    Date Medicare IM Given:    Medicare IM give by:    Date Additional Medicare IM Given:    Additional Medicare Important Message give by:     If discussed at [place] of Stay Meetings, dates discussed:    Additional Comments:  Dessa Phi, RN 06/23/2015, 12:49 PM

## 2015-06-23 NOTE — Evaluation (Signed)
Physical Therapy Evaluation Patient Details Name: Pamela Mcguire MRN: [id number] DOB: 08/04/1935 Today's Date: 06/23/2015   History of Present Illness  80 year old woman with a history of Stage 3 Non small cell carcinoma of the lung, arthritis, afib, CAD, and anxiety admitted with SIRS  Clinical Impression  Pt admitted with above diagnosis. Pt currently with functional limitations due to the deficits listed below (see PT Problem List).  Pt will benefit from skilled PT to increase their independence and safety with mobility to allow discharge to the venue listed below.  Pt assisted with short distance ambulation, self limiting due to fatigue.  Family in room report pt has supervision at home.      Follow Up Recommendations Home health PT;Supervision for mobility/OOB    Equipment Recommendations  None recommended by PT    Recommendations for Other Services       Precautions / Restrictions Precautions Precautions: Fall      Mobility  Bed Mobility Overal bed mobility: Needs Assistance Bed Mobility: Supine to Sit     Supine to sit: Supervision;HOB elevated     General bed mobility comments: increased time and effort  Transfers Overall transfer level: Needs assistance Equipment used: Rolling walker (2 wheeled) Transfers: Sit to/from Stand Sit to Stand: Min guard         General transfer comment: difficulty with rise however did not require physical assist  Ambulation/Gait Ambulation/Gait assistance: Min guard Ambulation Distance (Feet): 40 Feet Assistive device: Rolling walker (2 wheeled) Gait Pattern/deviations: Step-through pattern;Decreased stride length     General Gait Details: pt self limited distance reporting fatigue, SPO2 on room air 96% upon return to room  Stairs            Wheelchair Mobility    Modified Rankin (Stroke Patients Only)       Balance                                             Pertinent Vitals/Pain Pain  Assessment: 0-10 Pain Score:  (not rated) Pain Location: "butt" Pain Descriptors / Indicators: Discomfort Pain Intervention(s): Limited activity within patient's tolerance;Monitored during session;Repositioned (RN aware pt states she uses "butt cream" at home)    Home Living Family/patient expects to be discharged to:: Private residence Living Arrangements: Children Available Help at Discharge: Family;Available 24 hours/day Type of Home: House Home Access: Stairs to enter   [employer] of Steps: 3-4 Home Layout: One level Home Equipment: Walker - 2 wheels;Cane - single point      Prior Function Level of Independence: Independent with assistive device(s)         Comments: typically uses cane at home     Hand Dominance        Extremity/Trunk Assessment               Lower Extremity Assessment: Generalized weakness         Communication   Communication: No difficulties  Cognition Arousal/Alertness: Awake/alert Behavior During Therapy: WFL for tasks assessed/performed Overall Cognitive Status: Within Functional Limits for tasks assessed                      General Comments      Exercises        Assessment/Plan    PT Assessment Patient needs continued PT services  PT Diagnosis Difficulty walking   PT  Problem List Decreased strength;Decreased mobility;Decreased activity tolerance;Decreased knowledge of use of DME  PT Treatment Interventions DME instruction;Gait training;Functional mobility training;Patient/family education;Therapeutic activities;Therapeutic exercise;Stair training   PT Goals (Current goals can be found in the Care Plan section) Acute Rehab PT Goals PT Goal Formulation: With patient/family Time For Goal Achievement: 06/30/15 Potential to Achieve Goals: Good    Frequency Min 3X/week   Barriers to discharge        Co-evaluation               End of Session Equipment Utilized During Treatment: Gait  belt Activity Tolerance: Patient limited by fatigue Patient left: in chair;with call bell/phone within reach;with chair alarm set;with family/visitor present Nurse Communication: Mobility status         Time: 4720-7218 PT Time Calculation (min) (ACUTE ONLY): 14 min   Charges:   PT Evaluation $Initial PT Evaluation Tier I: 1 Procedure     PT G Codes:        Pamela Mcguire,Pamela Mcguire 06/23/2015, 3:05 PM Pamela Mcguire, PT, DPT 06/23/2015 Pager: [phone]

## 2015-06-23 NOTE — Progress Notes (Signed)
Initial Nutrition Assessment  DOCUMENTATION CODES:   Obesity unspecified  INTERVENTION:  - Continue Regular diet - RD will continue to monitor for needs  NUTRITION DIAGNOSIS:   Inadequate oral intake related to poor appetite as evidenced by per patient/family report.  GOAL:   Patient will meet greater than or equal to 90% of their needs  MONITOR:   PO intake, Weight trends, Labs, I & O's  REASON FOR ASSESSMENT:   Malnutrition Screening Tool  ASSESSMENT:  80 y.o. female with a history of [place] Lung Cancer Stage III on chemotherapy and radiation dx in 03/2015 and last chemotherapy was Monday 07/18 and last radiation rx was on Friday 07/22 who presents to the ED with complaints of increased SOB,and sweats over the past 24 hours.   Pt seen for MST. BMI indicates obesity. Pt on CLD which was advanced to Regular this afternoon. She consumed 10% of breakfast yesterday and she states 0% intake of breakfast this AM. Family reports pt is awaiting salad which she ordered for lunch after diet advancement.  PTA, pt had a poor appetite but she is unable to state how long this was occuring. She states that she is feeling very nauseated and family states pt has been having diarrhea today. She was not drinking nutrition supplements at home and is not interested in receiving them at this time.  Family reports pt initially lost weight and weighed 165 lb, then gained weight to 191 lbs and more recently has been losing weight again. Per weight hx review, pt has lost 6 lbs (3% body weight) in the past 1 month which is not significant for time frame.  Not meeting needs. Medications reviewed. Labs reviewed; K: 3.2 mmol/L, Ca: 8.4 mg/dL.   Diet Order:  Diet regular Room service appropriate?: Yes; Fluid consistency:: Thin  Skin:  Reviewed, no issues  Last BM:  7/26  Height:   Ht Readings from Last 1 Encounters:  06/22/15 '5\' 6"'$  (1.676 m)    Weight:   Wt Readings from Last 1 Encounters:   06/22/15 186 lb 8.2 oz (84.6 kg)    Ideal Body Weight:  59.09 kg (kg)  Wt Readings from Last 10 Encounters:  06/22/15 186 lb 8.2 oz (84.6 kg)  06/19/15 182 lb 12.8 oz (82.918 kg)  06/15/15 183 lb 11.2 oz (83.326 kg)  06/12/15 185 lb 8 oz (84.142 kg)  06/05/15 187 lb 12.8 oz (85.186 kg)  05/27/15 191 lb 12.8 oz (87 kg)  05/21/15 192 lb 3.2 oz (87.181 kg)  05/18/15 190 lb 4.8 oz (86.32 kg)  05/13/15 190 lb (86.183 kg)  05/04/15 191 lb (86.637 kg)    BMI:  Body mass index is 30.12 kg/(m^2).  Estimated Nutritional Needs:   Kcal:  1700-1900  Protein:  85-100 grams  Fluid:  2-2.2 L/day  EDUCATION NEEDS:   No education needs identified at this time    Jarome Matin, RD, LDN Inpatient Clinical Dietitian Pager # [phone] After hours/weekend pager # [phone]

## 2015-06-23 NOTE — Progress Notes (Addendum)
[facility] PROGRESS NOTE  Pamela Mcguire [name] DOB: [birth year] DOA: 06/21/2015 PCP: Pamela Cliche, MD   Off Service Summary: 23HY with hx of R sided adenocarcinoma of the lung presents with fevers, concerns of SIRS. Pt has been continued on broad spectrum antibiotics. Dr. Julien Nordmann consulted and is following.  Assessment/Plan: 1. SIRS, unclear etiology 1. Follow cultures 2. Tmax of 100.1 on admit however pt remained afebrile afterwards 3. Pt is continued on empiric vanc and zosyn 4. Discussed case with Dr. Julien Nordmann who recommends continued broad spec abx for now, pending cx results  2. Chronic anemia 1. Suspect related to cancer tx 2. No obvious signs of blood loss 3. Follow CBC 3. Leukopenia 1. Consideration for Granix per Oncology for ANC<500 2. Cont to monitor 4. Paroxysmal afib 1. Remains rate controlled. 2. Cont current regimen 5. HTN 1. BP remains stable 2. Cont current regimen 6. Long term anticoagulation 1. On eliquis for hx of afib 7. RUL adenocarcinoma 1. Followed by Dr. Julien Nordmann 2. Recs to hold chemo at this time. 3. Consideration for Granix if ANC <500 4. Radiation Oncology was consulted 8. DVT prophylaxis 1. On eliquis per above  Code Status: Full Family Communication: Pt in room  Disposition Plan: Pending   Consultants:  Oncology (Dr. Julien Nordmann)  Radiation Oncology   Procedures:    Antibiotics:  Vancomycin 7/25>>>  Zosyn 7/25>>>  HPI/Subjective: No complaints. Wants to eat regular food  Objective: Filed Vitals:   06/22/15 2039 06/23/15 0106 06/23/15 0511 06/23/15 1410  BP: 151/77 141/77 148/73 125/56  Pulse: 89 87 94 85  Temp: 97.6 F (36.4 C) 97.5 F (36.4 C) 98 F (36.7 C) 97.3 F (36.3 C)  TempSrc: Oral Oral Oral Oral  Resp: '24 20 20 20  '$ Height:      Weight:      SpO2: 97% 97% 92% 98%    Intake/Output Summary (Last 24 hours) at 06/23/15 1438 Last data filed at 06/23/15 1055  Gross per 24 hour  Intake    535 ml   Output    151 ml  Net    384 ml   Filed Weights   06/21/15 2041 06/22/15 1133 06/22/15 1500  Weight: 82.555 kg (182 lb) 84.6 kg (186 lb 8.2 oz) 84.6 kg (186 lb 8.2 oz)    Exam:   General:  Awake, laying in bed, in nad  Cardiovascular: regular, s1, s2  Respiratory: normal resp effort, no wheezing  Abdomen: soft,nondistended, pos BS  Musculoskeletal: perfused, no clubbing   Data Reviewed: Basic Metabolic Panel:  Recent Labs Lab 06/21/15 2233 06/22/15 0400 06/23/15 0516  NA 135 137 138  K 3.7 3.7 3.2*  CL 104 110 107  CO2 '24 24 24  '$ GLUCOSE 137* 121* 121*  BUN '17 14 6  '$ CREATININE 0.98 0.84 0.69  CALCIUM 8.6* 7.5* 8.4*   Liver Function Tests:  Recent Labs Lab 06/21/15 2233 06/23/15 0516  AST 16 16  ALT 15 15  ALKPHOS 34* 30*  BILITOT 0.6 0.7  PROT 6.1* 5.8*  ALBUMIN 3.4* 2.9*   No results for input(s): LIPASE, AMYLASE in the last 168 hours. No results for input(s): AMMONIA in the last 168 hours. CBC:  Recent Labs Lab 06/21/15 2233 06/22/15 0400 06/23/15 0516  WBC 3.0* 1.9* 1.9*  NEUTROABS 2.4  --   --   HGB 10.6* 10.2* 10.3*  HCT 32.1* 30.8* 30.8*  MCV 85.4 89.0 87.3  PLT 191 173 187   Cardiac Enzymes:  Recent Labs Lab 06/21/15 2233  TROPONINI <0.03   BNP (last 3 results) No results for input(s): BNP in the last 8760 hours.  ProBNP (last 3 results) No results for input(s): PROBNP in the last 8760 hours.  CBG: No results for input(s): GLUCAP in the last 168 hours.  Recent Results (from the past 240 hour(s))  Culture, blood (routine x 2)     Status: None (Preliminary result)   Collection Time: 06/21/15  9:35 PM  Result Value Ref Range Status   Specimen Description BLOOD LEFT ARM  Final   Special Requests BOTTLES DRAWN AEROBIC AND ANAEROBIC 5ML  Final   Culture   Final    NO GROWTH 1 DAY Performed at [facility]    Report Status PENDING  Incomplete  Urine culture     Status: None   Collection Time: 06/21/15 10:31 PM   Result Value Ref Range Status   Specimen Description URINE, CLEAN CATCH  Final   Special Requests NONE  Final   Culture   Final    MULTIPLE SPECIES PRESENT, SUGGEST RECOLLECTION Performed at [facility]    Report Status 06/23/2015 FINAL  Final  Culture, blood (routine x 2)     Status: None (Preliminary result)   Collection Time: 06/21/15 10:34 PM  Result Value Ref Range Status   Specimen Description BLOOD BLOOD LEFT FOREARM  Final   Special Requests BOTTLES DRAWN AEROBIC AND ANAEROBIC 5ML  Final   Culture   Final    NO GROWTH 1 DAY Performed at [facility]    Report Status PENDING  Incomplete  MRSA PCR Screening     Status: None   Collection Time: 06/22/15  2:41 AM  Result Value Ref Range Status   MRSA by PCR NEGATIVE NEGATIVE Final    Comment:        The GeneXpert MRSA Assay (FDA approved for NASAL specimens only), is one component of a comprehensive MRSA colonization surveillance program. It is not intended to diagnose MRSA infection nor to guide or monitor treatment for MRSA infections.      Studies: Dg Chest 2 View  06/21/2015   CLINICAL DATA:  Lung cancer. Chest pains today. Dyspnea. Tachypnea.  EXAM: CHEST  2 VIEW  COMPARISON:  05/13/2015  FINDINGS: The right upper lobe mass is unchanged. There is no evidence of a superimposed acute infiltrate or congestive heart failure. Pulmonary vasculature is normal. There are no pleural effusions. Heart size is normal and unchanged.  IMPRESSION: Known right upper lobe mass.  No superimposed acute findings.   Electronically Signed   By: Andreas Newport M.D.   On: 06/21/2015 21:21   Ct Angio Chest Pe W/cm &/or Wo Cm  06/22/2015   CLINICAL DATA:  Tachycardia and dyspnea. Ongoing chemotherapy for lung cancer.  EXAM: CT ANGIOGRAPHY CHEST WITH CONTRAST  TECHNIQUE: Multidetector CT imaging of the chest was performed using the standard protocol during bolus administration of intravenous contrast. Multiplanar CT image  reconstructions and MIPs were obtained to evaluate the vascular anatomy.  CONTRAST:  147m OMNIPAQUE IOHEXOL 350 MG/ML SOLN  COMPARISON:  PET 04/10/2015  FINDINGS: Cardiovascular: There is good opacification of the pulmonary arteries. There is no pulmonary embolism. The thoracic aorta is normal in caliber and intact. There are atherosclerotic calcifications of the aorta and Coronary arteries.  Lungs: The right apical lung mass is slightly smaller, measuring 8.4 x 16.9 mm and previously measuring 12.9 x 18.1 mm on 04/10/2015. Chronic fibrotic appearing interstitial coarsening is again evident. No superimposed acute disease  is evident.  Central airways: Patent  Effusions: None  Lymphadenopathy: Unchanged mediastinal and right hilar adenopathy  Esophagus: Unremarkable  Upper abdomen: Small hiatal hernia  Musculoskeletal: No significant skeletal lesion. Moderate kyphosis and degenerative disc disease.  Review of the MIP images confirms the above findings.  IMPRESSION: 1. Negative for acute pulmonary embolism. There is moderate atherosclerotic calcification of the aorta and Coronary arteries. 2. Mild size reduction of the right apical lung mass. Unchanged mediastinal and hilar adenopathy. 3. No acute findings are evident.   Electronically Signed   By: Andreas Newport M.D.   On: 06/22/2015 01:06    Scheduled Meds: . apixaban  5 mg Oral BID  . escitalopram  10 mg Oral Daily  . ezetimibe-simvastatin  1 tablet Oral Daily  . feeding supplement  1 Container Oral TID BM  . FLORA-Q  1 capsule Oral BID  . LORazepam  1 mg Oral 3 times per day  . metoprolol tartrate  25 mg Oral BID  . pantoprazole  40 mg Oral Daily  . piperacillin-tazobactam (ZOSYN)  IV  3.375 g Intravenous 3 times per day  . sucralfate  1 g Oral TID WC & HS  . vancomycin  750 mg Intravenous Q12H  . vitamin B-12  1,000 mcg Oral Daily   Continuous Infusions: . sodium chloride 75 mL/hr at 06/23/15 0544    Principal Problem:   SIRS (systemic  inflammatory response syndrome) Active Problems:   Paroxysmal a-fib   HTN (hypertension)   Long term current use of antiarrhythmic drug   Adenocarcinoma of upper lobe of right lung   Tachypnea   Anemia   Leukopenia due to antineoplastic chemotherapy   Hypocalcemia   Fausto Sampedro, [facility] Pager [phone]. If 7PM-7AM, please contact night-coverage at [email or web address], password [facility] 06/23/2015, 2:38 PM  LOS: 1 day

## 2015-06-24 ENCOUNTER — Ambulatory Visit
Admission: RE | Admit: 2015-06-24 | Discharge: 2015-06-24 | Disposition: A | Discharge status: Home / Self Care | Payer: Medicare HMO | Source: Ambulatory Visit | Attending: Radiation Oncology | Admitting: Radiation Oncology

## 2015-06-24 DIAGNOSIS — I10 Essential (primary) hypertension: Secondary | ICD-10-CM

## 2015-06-24 DIAGNOSIS — C3411 Malignant neoplasm of upper lobe, right bronchus or lung: Principal | ICD-10-CM

## 2015-06-24 DIAGNOSIS — I480 Paroxysmal atrial fibrillation: Secondary | ICD-10-CM

## 2015-06-24 LAB — CBC
HCT: 29.2 % — ABNORMAL LOW (ref 36.0–46.0)
Hemoglobin: 9.4 g/dL — ABNORMAL LOW (ref 12.0–15.0)
MCH: 27.7 pg (ref 26.0–34.0)
MCHC: 32.2 g/dL (ref 30.0–36.0)
MCV: 86.1 fL (ref 78.0–100.0)
Platelets: 188 10*3/uL (ref 150–400)
RBC: 3.39 MIL/uL — ABNORMAL LOW (ref 3.87–5.11)
RDW: 14.9 % (ref 11.5–15.5)
WBC: 1.8 10*3/uL — ABNORMAL LOW (ref 4.0–10.5)

## 2015-06-24 LAB — BASIC METABOLIC PANEL
Anion gap: 6 (ref 5–15)
BUN: 6 mg/dL (ref 6–20)
CALCIUM: 8.1 mg/dL — AB (ref 8.9–10.3)
CO2: 25 mmol/L (ref 22–32)
CREATININE: 0.74 mg/dL (ref 0.44–1.00)
Chloride: 108 mmol/L (ref 101–111)
GFR calc non Af Amer: >60 mL/min (ref >60)
Glucose, Bld: 168 mg/dL — ABNORMAL HIGH (ref 65–99)
Potassium: 3 mmol/L — ABNORMAL LOW (ref 3.5–5.1)
Sodium: 139 mmol/L (ref 135–145)

## 2015-06-24 LAB — MAGNESIUM: Magnesium: 1.2 mg/dL — ABNORMAL LOW (ref 1.7–2.4)

## 2015-06-24 MED ORDER — POTASSIUM CHLORIDE CRYS ER 20 MEQ PO TBCR
40.0000 meq | EXTENDED_RELEASE_TABLET | ORAL | Status: AC
  Administered 2015-06-24 (×2): 40 meq via ORAL
  Filled 2015-06-24 (×2): qty 2

## 2015-06-24 MED ORDER — LORAZEPAM 1 MG PO TABS
1.0000 mg | ORAL_TABLET | Freq: Every day | ORAL | Status: DC
  Administered 2015-06-25: 1 mg via ORAL
  Filled 2015-06-24: qty 1

## 2015-06-24 NOTE — Progress Notes (Signed)
Patient experiencing pain when wiping after using the bathroom.  Inner buttocks very red/ raw and sore to touch.  Applied barrier cream to area.  Chest and back have radiation burn present, applied Prutect cream prior to bedtime.  Will continue to monitor patient.

## 2015-06-24 NOTE — Progress Notes (Addendum)
[facility] Progress Note   Pamela Mcguire HEN:[id number] DOB: Aug 07, 1935 DOA: 06/21/2015 PCP: [name], MD  Brief narrative: Pamela Mcguire is a 80 y.o. female with adenocarcinoma of the lung who presented to the hospital with a fever. Temperature was 100.1. She was started on vancomycin and Zosyn empirically as she was immune compromised secondary to chemotherapy. She has not had a recurrence of fever. No source for fever has been found.   Subjective: [place] of pain in her neck. This is not worse when she swallows. It is not worse with palpation. She is a poor historian and is unable to tell me any further details about this pain. She does not have a cough nausea or vomiting. According to the staff she has not had any diarrhea or abdominal pain.  Assessment/Plan: Principal Problem:   SIRS (systemic inflammatory response syndrome) -With tachypnea and low-grade fever of 100.1 rectally on admission --CT scan of the chest not suggestive of infection-no cough - Blood cultures negative -Urine culture from 7/5 revealed Proteus-she was treated with ciprofloxacin for this according to her daughter-- UA mildly positive but culture shows multiple bacteria without any single predominant bacteria-patient had no complaints of dysuria which is her usual complaint when she has a UTI-has received 3 days of vancomycin and Zosyn-Will discontinue antibiotics today  Active Problems:   Paroxysmal a-fib -Continue eliquis-.continue Lopressor -Currently normal sinus rhythm  Hypertension --Cont Lopressor    Adenocarcinoma of upper lobe of right lung -Chemo and radiation as outpatient    Leukopenia due to antineoplastic chemotherapy -Check absolute neutrophil count tomorrow and if low, will order Granix  Dehydration with poor by mouth intake in the hospital -Continue slow IV fluids- daughter states she eats better at home than she does in the hospital-usually has no trouble with eating at  home  Hypokalemia -Replace and recheck tomorrow morning    Code Status: Full code Family Communication: Daughter Disposition Plan: Home-physical therapy recommends home health PT but according to her daughter, the patient does not need this DVT prophylaxis: Eliquis Consultants: Procedures:  Antibiotics: Anti-infectives    Start     Dose/Rate Route Frequency Ordered Stop   06/22/15 1800  vancomycin (VANCOCIN) IVPB 750 mg/150 ml premix     750 mg 150 mL/hr over 60 Minutes Intravenous Every 12 hours 06/22/15 0219     06/22/15 0600  piperacillin-tazobactam (ZOSYN) IVPB 3.375 g  Status:  Discontinued     3.375 g 100 mL/hr over 30 Minutes Intravenous 3 times per day 06/22/15 0156 06/22/15 0211   06/22/15 0600  piperacillin-tazobactam (ZOSYN) IVPB 3.375 g     3.375 g 12.5 mL/hr over 240 Minutes Intravenous 3 times per day 06/22/15 0211     06/22/15 0200  piperacillin-tazobactam (ZOSYN) IVPB 3.375 g  Status:  Discontinued     3.375 g 100 mL/hr over 30 Minutes Intravenous  Once 06/22/15 0155 06/22/15 0156   06/22/15 0015  vancomycin (VANCOCIN) 1,500 mg in sodium chloride 0.9 % 500 mL IVPB     1,500 mg 250 mL/hr over 120 Minutes Intravenous  Once 06/22/15 0002 06/22/15 0325   06/22/15 0015  piperacillin-tazobactam (ZOSYN) IVPB 3.375 g     3.375 g 100 mL/hr over 30 Minutes Intravenous  Once 06/22/15 0002 06/22/15 0121      Objective: Filed Weights   06/21/15 2041 06/22/15 1133 06/22/15 1500  Weight: 82.555 kg (182 lb) 84.6 kg (186 lb 8.2 oz) 84.6 kg (186 lb 8.2 oz)    Intake/Output Summary (Last 24 hours)  at 06/24/15 1522 Last data filed at 06/24/15 1100  Gross per 24 hour  Intake   1990 ml  Output    300 ml  Net   1690 ml     Vitals Filed Vitals:   06/24/15 0532 06/24/15 0900 06/24/15 1017 06/24/15 1457  BP: 127/73  135/70 149/79  Pulse: 91  89 88  Temp: 97.7 F (36.5 C)  97.3 F (36.3 C) 98.9 F (37.2 C)  TempSrc: Oral  Oral Oral  Resp: '26 16 18 20  '$ Height:       Weight:      SpO2: 98%  94% 96%    Exam:  General:  Pt is alert, not in acute distress  HEENT: No icterus, No thrush, oral mucosa moist  Cardiovascular: regular rate and rhythm, S1/S2 No murmur  Respiratory: clear to auscultation bilaterally   Abdomen: Soft, +Bowel sounds, non tender, non distended, no guarding  MSK: No LE edema, cyanosis or clubbing  Data Reviewed: Basic Metabolic Panel:  Recent Labs Lab 06/21/15 2233 06/22/15 0400 06/23/15 0516 06/24/15 0619  NA 135 137 138 139  K 3.7 3.7 3.2* 3.0*  CL 104 110 107 108  CO2 '24 24 24 25  '$ GLUCOSE 137* 121* 121* 168*  BUN '17 14 6 6  '$ CREATININE 0.98 0.84 0.69 0.74  CALCIUM 8.6* 7.5* 8.4* 8.1*   Liver Function Tests:  Recent Labs Lab 06/21/15 2233 06/23/15 0516  AST 16 16  ALT 15 15  ALKPHOS 34* 30*  BILITOT 0.6 0.7  PROT 6.1* 5.8*  ALBUMIN 3.4* 2.9*   No results for input(s): LIPASE, AMYLASE in the last 168 hours. No results for input(s): AMMONIA in the last 168 hours. CBC:  Recent Labs Lab 06/21/15 2233 06/22/15 0400 06/23/15 0516 06/24/15 0619  WBC 3.0* 1.9* 1.9* 1.8*  NEUTROABS 2.4  --   --   --   HGB 10.6* 10.2* 10.3* 9.4*  HCT 32.1* 30.8* 30.8* 29.2*  MCV 85.4 89.0 87.3 86.1  PLT 191 173 187 188   Cardiac Enzymes:  Recent Labs Lab 06/21/15 2233  TROPONINI <0.03   BNP (last 3 results) No results for input(s): BNP in the last 8760 hours.  ProBNP (last 3 results) No results for input(s): PROBNP in the last 8760 hours.  CBG: No results for input(s): GLUCAP in the last 168 hours.  Recent Results (from the past 240 hour(s))  Culture, blood (routine x 2)     Status: None (Preliminary result)   Collection Time: 06/21/15  9:35 PM  Result Value Ref Range Status   Specimen Description BLOOD LEFT ARM  Final   Special Requests BOTTLES DRAWN AEROBIC AND ANAEROBIC 5ML  Final   Culture   Final    NO GROWTH 1 DAY Performed at [facility]    Report Status PENDING  Incomplete   Urine culture     Status: None   Collection Time: 06/21/15 10:31 PM  Result Value Ref Range Status   Specimen Description URINE, CLEAN CATCH  Final   Special Requests NONE  Final   Culture   Final    MULTIPLE SPECIES PRESENT, SUGGEST RECOLLECTION Performed at [facility]    Report Status 06/23/2015 FINAL  Final  Culture, blood (routine x 2)     Status: None (Preliminary result)   Collection Time: 06/21/15 10:34 PM  Result Value Ref Range Status   Specimen Description BLOOD BLOOD LEFT FOREARM  Final   Special Requests BOTTLES DRAWN AEROBIC AND ANAEROBIC 5ML  Final   Culture   Final    NO GROWTH 1 DAY Performed at [facility]    Report Status PENDING  Incomplete  MRSA PCR Screening     Status: None   Collection Time: 06/22/15  2:41 AM  Result Value Ref Range Status   MRSA by PCR NEGATIVE NEGATIVE Final    Comment:        The GeneXpert MRSA Assay (FDA approved for NASAL specimens only), is one component of a comprehensive MRSA colonization surveillance program. It is not intended to diagnose MRSA infection nor to guide or monitor treatment for MRSA infections.      Studies: No results found.  Scheduled Meds:  Scheduled Meds: . acidophilus  1 capsule Oral BID  . apixaban  5 mg Oral BID  . escitalopram  10 mg Oral Daily  . ezetimibe-simvastatin  1 tablet Oral Daily  . feeding supplement  1 Container Oral TID BM  . LORazepam  1 mg Oral 3 times per day  . metoprolol tartrate  25 mg Oral BID  . pantoprazole  40 mg Oral Daily  . piperacillin-tazobactam (ZOSYN)  IV  3.375 g Intravenous 3 times per day  . sucralfate  1 g Oral TID WC & HS  . vancomycin  750 mg Intravenous Q12H  . vitamin B-12  1,000 mcg Oral Daily   Continuous Infusions: . sodium chloride 75 mL/hr at 06/23/15 2205    Time spent on care of this patient: 72 min   Robinson, MD 06/24/2015, 3:22 PM  LOS: 2 days   [facility] Office  [phone] Pager - Text Page per  [email or web address] If 7PM-7AM, please contact night-coverage [email or web address]

## 2015-06-24 NOTE — Care Management Important Message (Signed)
Important Message  Patient Details  Name: Pamela Mcguire MRN: [id number] Date of Birth: 1935-10-01   Medicare Important Message Given:  Yes-second notification given    Camillo Flaming 06/24/2015, 12:38 [place] Message  Patient Details  Name: Pamela Mcguire MRN: [id number] Date of Birth: 06-10-1935   Medicare Important Message Given:  Yes-second notification given    Camillo Flaming 06/24/2015, 12:37 PM

## 2015-06-24 NOTE — Progress Notes (Signed)
Patient had 6 beats of V-tach around 05:15 this am when getting up to [facility].  No chest pain on shortness of breath and vitals stable.  Notified NP via text page. No call back or new orders.  Will continue to monitor patient.

## 2015-06-24 NOTE — Progress Notes (Signed)
[place] [facility] Dept Therapy Treatment Record Phone [phone]   Radiation Therapy was administered to Pamela Mcguire on: 06/24/2015  9:40 AM and was treatment # 27out of a planned course of 33treatments.

## 2015-06-24 NOTE — Progress Notes (Signed)
Patient had a run of SVT, pt asymptomatic. NP on call notified. Will continue to monitor.

## 2015-06-25 ENCOUNTER — Ambulatory Visit
Admission: RE | Admit: 2015-06-25 | Discharge: 2015-06-25 | Disposition: A | Discharge status: Home / Self Care | Payer: Medicare HMO | Source: Ambulatory Visit | Attending: Radiation Oncology | Admitting: Radiation Oncology

## 2015-06-25 ENCOUNTER — Encounter: Payer: Self-pay | Admitting: Radiation Oncology

## 2015-06-25 VITALS — BP 146/61 | HR 82 | Temp 98.2°F | Resp 20

## 2015-06-25 DIAGNOSIS — N179 Acute kidney failure, unspecified: Secondary | ICD-10-CM

## 2015-06-25 DIAGNOSIS — C3411 Malignant neoplasm of upper lobe, right bronchus or lung: Secondary | ICD-10-CM

## 2015-06-25 LAB — BASIC METABOLIC PANEL
ANION GAP: 7 (ref 5–15)
Anion gap: 9 (ref 5–15)
BUN: 7 mg/dL (ref 6–20)
BUN: 6 mg/dL (ref 6–20)
CALCIUM: 8.6 mg/dL — AB (ref 8.9–10.3)
CO2: 21 mmol/L — ABNORMAL LOW (ref 22–32)
CO2: 19 mmol/L — ABNORMAL LOW (ref 22–32)
CREATININE: 1.38 mg/dL — AB (ref 0.44–1.00)
CREATININE: 1.37 mg/dL — AB (ref 0.44–1.00)
Calcium: 8.9 mg/dL (ref 8.9–10.3)
Chloride: 113 mmol/L — ABNORMAL HIGH (ref 101–111)
Chloride: 112 mmol/L — ABNORMAL HIGH (ref 101–111)
GFR calc Af Amer: 41 mL/min — ABNORMAL LOW (ref >60)
GFR calc non Af Amer: 36 mL/min — ABNORMAL LOW (ref >60)
GFR calc non Af Amer: 36 mL/min — ABNORMAL LOW (ref >60)
GFR, EST AFRICAN AMERICAN: 42 mL/min — AB (ref >60)
GLUCOSE: 137 mg/dL — AB (ref 65–99)
Glucose, Bld: 140 mg/dL — ABNORMAL HIGH (ref 65–99)
POTASSIUM: 3.6 mmol/L (ref 3.5–5.1)
Potassium: 3.7 mmol/L (ref 3.5–5.1)
Sodium: 141 mmol/L (ref 135–145)
Sodium: 140 mmol/L (ref 135–145)

## 2015-06-25 LAB — CBC
HEMATOCRIT: 31.6 % — AB (ref 36.0–46.0)
Hemoglobin: 10.4 g/dL — ABNORMAL LOW (ref 12.0–15.0)
MCH: 29.3 pg (ref 26.0–34.0)
MCHC: 32.9 g/dL (ref 30.0–36.0)
MCV: 89 fL (ref 78.0–100.0)
Platelets: 229 10*3/uL (ref 150–400)
RBC: 3.55 MIL/uL — ABNORMAL LOW (ref 3.87–5.11)
RDW: 15.8 % — AB (ref 11.5–15.5)
WBC: 2.9 10*3/uL — ABNORMAL LOW (ref 4.0–10.5)

## 2015-06-25 MED ORDER — MAGNESIUM SULFATE 4 GM/100ML IV SOLN
4.0000 g | Freq: Once | INTRAVENOUS | Status: AC
  Administered 2015-06-25: 4 g via INTRAVENOUS
  Filled 2015-06-25: qty 100

## 2015-06-25 MED ORDER — OXYCODONE HCL 5 MG PO TABS: 5.0000 mg | ORAL_TABLET | Freq: Four times a day (QID) | ORAL | Status: DC | PRN

## 2015-06-25 NOTE — Progress Notes (Signed)
PT Cancellation Note  Patient Details Name: Jeilani Grupe MRN: [id number] DOB: [birth year]   Cancelled Treatment:    Reason Eval/Treat Not Completed: Fatigue/lethargy limiting ability to participate Pt politely declined PT today stating she was tired.  RN reports pt to d/c home with daughter this afternoon.   Dmitri Pettigrew,KATHrine E 06/25/2015, 11:20 AM Carmelia Bake, PT, DPT 06/25/2015 Pager: [phone]

## 2015-06-25 NOTE — Progress Notes (Signed)
[place] [facility] Therapy Treatment Record Phone [phone]   Radiation Therapy was administered to Pamela Mcguire on: 06/25/2015  3:03 PM and was treatment # 28 out of a planned course of 33 treatments.

## 2015-06-25 NOTE — Care Management Note (Signed)
Case Management Note  Patient Details  Name: Pamela Mcguire MRN: [id number] Date of Birth: [birth year]  Subjective/Objective:  [facility] following for HHPT(already ordered). Noted SVT.                  Action/Plan:d/c plan home w/HHC.   Expected Discharge Date:                  Expected Discharge Plan:  [place]  In-House Referral:  Clinical Social Work  Discharge planning Services  CM Consult  Post Acute Care Choice:  NA Choice offered to:  Patient  DME Arranged:  N/A DME Agency:  [place]:  PT [place] Agency:  NA  Status of Service:  In process, will continue to follow  Medicare Important Message Given:  Yes-second notification given Date Medicare IM Given:    Medicare IM give by:    Date Additional Medicare IM Given:    Additional Medicare Important Message give by:     If discussed at [place] of Stay Meetings, dates discussed:    Additional Comments:  Dessa Phi, RN 06/25/2015, 1:39 PM

## 2015-06-25 NOTE — Progress Notes (Signed)
  Radiation Oncology         [phone]   Name: Pamela Mcguire MRN: [id number]   Date: 06/25/2015  DOB: 15-Mar-1935      INPATIENT   Weekly Radiation Therapy Management  Diagnosis: Pamela Mcguire is a 80 year old woman presenting to clinic in regards to her adenocarcinoma of upper lobe of the right lung.  Current Dose: 56 Gy  Planned Dose:  66 Gy  Narrative The patient presents for routine under treatment assessment and has completed 28 of 33 treatments. According to telemetry she is showing SR with PVC's. She expressed pain while swallowing, leading to issues with eating. She was recently admitted to the hospital for possibly being septic. The patient was a little confused today and did not sit up during the appointment. Set-up films were reviewed and the chart was checked. She was not accompanied with anyone for her appointment today.  Physical Findings  oral temperature is 98.2 F (36.8 C). Her blood pressure is 146/61 and her pulse is 82. Her respiration is 20 and oxygen saturation is 99%. .   Impression Pamela Mcguire is a 80 year old woman presenting to clinic in regards to her adenocarcinoma of upper lobe of the right lung. The patient is tolerating radiation but expressed concerns about pain while swallowing.  Plan The patient has been advised to continue treatment as planned. All questions vocalized by the patient were fully addressed. Common symptoms to expect at this time in the patient's recovery were discussed and reviewed. Healthy methods to manage these symptoms if they are to occur were addressed. If she develops any further questions or concerns in regards to his treatment and recovery, he has been encouraged to contact Dr. Tammi Klippel, MD. She is aware of her follow up appointment to take place with radiation oncology.   This document serves as a record of services personally performed by Tyler Pita, MD. It was created on his behalf by Lenn Cal, a trained medical scribe.  The creation of this record is based on the scribe's personal observations and the provider's statements to them. This document has been checked and approved by the attending provider.  ________________________________________   Sheral Apley. Tammi Klippel, M.D.

## 2015-06-25 NOTE — Progress Notes (Signed)
weekly rad txs 28/33 completed inpatient 1416, on telemetry, shows SR with PVC's , no c/o pain or nausea, poor appetite, a little confused today, doesn't know ehy she was admitted to the hospital,  3:27 PM BP 146/61 mmHg  Pulse 82  Temp(Src) 98.2 F (36.8 C) (Oral)  Resp 20  SpO2 99%  Wt Readings from Last 3 Encounters:  06/22/15 186 lb 8.2 oz (84.6 kg)  06/19/15 182 lb 12.8 oz (82.918 kg)  06/15/15 183 lb 11.2 oz (83.326 kg)  IVF 'S Ns @ 67m/hr left inner arm, intact, no redness at site, opsite clear and clean,

## 2015-06-25 NOTE — Progress Notes (Signed)
[facility] Progress Note   Pamela Mcguire TIR:[id number] DOB: 1935/01/14 DOA: 06/21/2015 PCP: [name], MD  Brief narrative: Pamela Mcguire is a 80 y.o. female with adenocarcinoma of the lung who presented to the hospital with a fever. Temperature was 100.1. She was started on vancomycin and Zosyn empirically as she was immune compromised secondary to chemotherapy. She has not had a recurrence of fever. No source for fever has been found.   Subjective: Feels weak. No other complaints.   Assessment/Plan: Principal Problem:   SIRS (systemic inflammatory response syndrome) -With tachypnea and low-grade fever of 100.1 rectally on admission --CT scan of the chest not suggestive of infection-no cough - Blood cultures negative -Urine culture from 7/5 revealed Proteus-she was treated with ciprofloxacin for this according to her daughter-- UA mildly positive but culture shows multiple bacteria without any single predominant bacteria-patient had no complaints of dysuria which is her usual complaint when she has a UTI-has received 3 days of vancomycin and Zosyn- discontinued on 7/27- monitor for fevers  Active Problems: ARF - rise in Cr today to 1.37 from 0.74- may have been due to Vancomycin which was held yesterday- repeat Bmet tomorrow    Paroxysmal a-fib -Continue eliquis-.continue Lopressor -Currently normal sinus rhythm  Hypertension --Cont Lopressor    Adenocarcinoma of upper lobe of right lung -Chemo and radiation as outpatient    Leukopenia due to antineoplastic chemotherapy -differential ordered by was not done by lab- WBC count is up to 2.9 today from 1.8 therefore doubtful that she is neutropenic- does not need Granix  Dehydration with poor by mouth intake in the hospital -Continue slow IV fluids- daughter states she eats better at home than she does in the hospital-usually has no trouble with eating at home  Hypokalemia -Replace and recheck tomorrow  morning    Code Status: Full code Family Communication: Daughter Disposition Plan: Home-physical therapy recommends home health PT but according to her daughter, the patient does not need this DVT prophylaxis: Eliquis Consultants: Procedures:  Antibiotics: Anti-infectives    Start     Dose/Rate Route Frequency Ordered Stop   06/22/15 1800  vancomycin (VANCOCIN) IVPB 750 mg/150 ml premix  Status:  Discontinued     750 mg 150 mL/hr over 60 Minutes Intravenous Every 12 hours 06/22/15 0219 06/24/15 1534   06/22/15 0600  piperacillin-tazobactam (ZOSYN) IVPB 3.375 g  Status:  Discontinued     3.375 g 100 mL/hr over 30 Minutes Intravenous 3 times per day 06/22/15 0156 06/22/15 0211   06/22/15 0600  piperacillin-tazobactam (ZOSYN) IVPB 3.375 g  Status:  Discontinued     3.375 g 12.5 mL/hr over 240 Minutes Intravenous 3 times per day 06/22/15 0211 06/24/15 1534   06/22/15 0200  piperacillin-tazobactam (ZOSYN) IVPB 3.375 g  Status:  Discontinued     3.375 g 100 mL/hr over 30 Minutes Intravenous  Once 06/22/15 0155 06/22/15 0156   06/22/15 0015  vancomycin (VANCOCIN) 1,500 mg in sodium chloride 0.9 % 500 mL IVPB     1,500 mg 250 mL/hr over 120 Minutes Intravenous  Once 06/22/15 0002 06/22/15 0325   06/22/15 0015  piperacillin-tazobactam (ZOSYN) IVPB 3.375 g     3.375 g 100 mL/hr over 30 Minutes Intravenous  Once 06/22/15 0002 06/22/15 0121      Objective: Filed Weights   06/21/15 2041 06/22/15 1133 06/22/15 1500  Weight: 82.555 kg (182 lb) 84.6 kg (186 lb 8.2 oz) 84.6 kg (186 lb 8.2 oz)    Intake/Output Summary (Last 24 hours) at  06/25/15 1442 Last data filed at 06/25/15 1207  Gross per 24 hour  Intake   2880 ml  Output    200 ml  Net   2680 ml     Vitals Filed Vitals:   06/25/15 0317 06/25/15 0532 06/25/15 1015 06/25/15 1220  BP: 169/75 150/72 121/70 138/70  Pulse:  90 87 88  Temp:  97.8 F (36.6 C) 97.2 F (36.2 C) 98.2 F (36.8 C)  TempSrc:  Oral Oral Oral  Resp:   '18 18 16  '$ Height:      Weight:      SpO2:  97% 97% 96%    Exam:  General:  Pt is alert, not in acute distress  HEENT: No icterus, No thrush, oral mucosa moist  Cardiovascular: regular rate and rhythm, S1/S2 No murmur  Respiratory: clear to auscultation bilaterally   Abdomen: Soft, +Bowel sounds, non tender, non distended, no guarding  MSK: No LE edema, cyanosis or clubbing  Data Reviewed: Basic Metabolic Panel:  Recent Labs Lab 06/22/15 0400 06/23/15 0516 06/24/15 0619 06/24/15 2223 06/25/15 0537 06/25/15 1216  NA 137 138 139  --  141 140  K 3.7 3.2* 3.0*  --  3.6 3.7  CL 110 107 108  --  113* 112*  CO2 '24 24 25  '$ --  21* 19*  GLUCOSE 121* 121* 168*  --  140* 137*  BUN '14 6 6  '$ --  7 6  CREATININE 0.84 0.69 0.74  --  1.38* 1.37*  CALCIUM 7.5* 8.4* 8.1*  --  8.6* 8.9  MG  --   --   --  1.2*  --   --    Liver Function Tests:  Recent Labs Lab 06/21/15 2233 06/23/15 0516  AST 16 16  ALT 15 15  ALKPHOS 34* 30*  BILITOT 0.6 0.7  PROT 6.1* 5.8*  ALBUMIN 3.4* 2.9*   No results for input(s): LIPASE, AMYLASE in the last 168 hours. No results for input(s): AMMONIA in the last 168 hours. CBC:  Recent Labs Lab 06/21/15 2233 06/22/15 0400 06/23/15 0516 06/24/15 0619 06/25/15 0605  WBC 3.0* 1.9* 1.9* 1.8* 2.9*  NEUTROABS 2.4  --   --   --   --   HGB 10.6* 10.2* 10.3* 9.4* 10.4*  HCT 32.1* 30.8* 30.8* 29.2* 31.6*  MCV 85.4 89.0 87.3 86.1 89.0  PLT 191 173 187 188 229   Cardiac Enzymes:  Recent Labs Lab 06/21/15 2233  TROPONINI <0.03   BNP (last 3 results) No results for input(s): BNP in the last 8760 hours.  ProBNP (last 3 results) No results for input(s): PROBNP in the last 8760 hours.  CBG: No results for input(s): GLUCAP in the last 168 hours.  Recent Results (from the past 240 hour(s))  Culture, blood (routine x 2)     Status: None (Preliminary result)   Collection Time: 06/21/15  9:35 PM  Result Value Ref Range Status   Specimen  Description BLOOD LEFT ARM  Final   Special Requests BOTTLES DRAWN AEROBIC AND ANAEROBIC 5ML  Final   Culture   Final    NO GROWTH 3 DAYS Performed at [facility]    Report Status PENDING  Incomplete  Urine culture     Status: None   Collection Time: 06/21/15 10:31 PM  Result Value Ref Range Status   Specimen Description URINE, CLEAN CATCH  Final   Special Requests NONE  Final   Culture   Final    MULTIPLE SPECIES PRESENT,  SUGGEST RECOLLECTION Performed at [facility]    Report Status 06/23/2015 FINAL  Final  Culture, blood (routine x 2)     Status: None (Preliminary result)   Collection Time: 06/21/15 10:34 PM  Result Value Ref Range Status   Specimen Description BLOOD BLOOD LEFT FOREARM  Final   Special Requests BOTTLES DRAWN AEROBIC AND ANAEROBIC 5ML  Final   Culture   Final    NO GROWTH 3 DAYS Performed at [facility]    Report Status PENDING  Incomplete  MRSA PCR Screening     Status: None   Collection Time: 06/22/15  2:41 AM  Result Value Ref Range Status   MRSA by PCR NEGATIVE NEGATIVE Final    Comment:        The GeneXpert MRSA Assay (FDA approved for NASAL specimens only), is one component of a comprehensive MRSA colonization surveillance program. It is not intended to diagnose MRSA infection nor to guide or monitor treatment for MRSA infections.      Studies: No results found.  Scheduled Meds:  Scheduled Meds: . acidophilus  1 capsule Oral BID  . apixaban  5 mg Oral BID  . escitalopram  10 mg Oral Daily  . ezetimibe-simvastatin  1 tablet Oral Daily  . feeding supplement  1 Container Oral TID BM  . LORazepam  1 mg Oral QHS  . metoprolol tartrate  25 mg Oral BID  . pantoprazole  40 mg Oral Daily  . sucralfate  1 g Oral TID WC & HS  . vitamin B-12  1,000 mcg Oral Daily   Continuous Infusions: . sodium chloride 75 mL/hr at 06/25/15 1258    Time spent on care of this patient: 54 min   [place], MD 06/25/2015, 2:42  PM  LOS: 3 days   [facility] Office  [phone] Pager - Text Page per [email or web address] If 7PM-7AM, please contact night-coverage [email or web address]

## 2015-06-25 NOTE — Progress Notes (Signed)
Pt had a 18 beat run of SVT. Pt asymptomatic. VSS. MD notified via text page. Will continue to monitor.

## 2015-06-26 ENCOUNTER — Ambulatory Visit
Admission: RE | Admit: 2015-06-26 | Discharge: 2015-06-26 | Disposition: A | Discharge status: Home / Self Care | Payer: Medicare HMO | Source: Ambulatory Visit | Attending: Radiation Oncology | Admitting: Radiation Oncology

## 2015-06-26 ENCOUNTER — Other Ambulatory Visit: Payer: Self-pay | Admitting: Internal Medicine

## 2015-06-26 DIAGNOSIS — I472 Ventricular tachycardia: Secondary | ICD-10-CM

## 2015-06-26 LAB — CBC
HEMATOCRIT: 28.3 % — AB (ref 36.0–46.0)
Hemoglobin: 9.8 g/dL — ABNORMAL LOW (ref 12.0–15.0)
MCH: 29.3 pg (ref 26.0–34.0)
MCHC: 34.6 g/dL (ref 30.0–36.0)
MCV: 84.7 fL (ref 78.0–100.0)
Platelets: 227 10*3/uL (ref 150–400)
RBC: 3.34 MIL/uL — ABNORMAL LOW (ref 3.87–5.11)
RDW: 15.5 % (ref 11.5–15.5)
WBC: 3 10*3/uL — ABNORMAL LOW (ref 4.0–10.5)

## 2015-06-26 LAB — BASIC METABOLIC PANEL
Anion gap: 7 (ref 5–15)
BUN: 5 mg/dL — AB (ref 6–20)
CHLORIDE: 110 mmol/L (ref 101–111)
CO2: 21 mmol/L — ABNORMAL LOW (ref 22–32)
Calcium: 8.2 mg/dL — ABNORMAL LOW (ref 8.9–10.3)
Creatinine, Ser: 1.27 mg/dL — ABNORMAL HIGH (ref 0.44–1.00)
GFR calc Af Amer: 46 mL/min — ABNORMAL LOW (ref >60)
GFR calc non Af Amer: 39 mL/min — ABNORMAL LOW (ref >60)
GLUCOSE: 125 mg/dL — AB (ref 65–99)
POTASSIUM: 3.4 mmol/L — AB (ref 3.5–5.1)
SODIUM: 138 mmol/L (ref 135–145)

## 2015-06-26 LAB — MAGNESIUM: MAGNESIUM: 1.4 mg/dL — AB (ref 1.7–2.4)

## 2015-06-26 MED ORDER — LORAZEPAM 1 MG PO TABS: 1.0000 mg | ORAL_TABLET | Freq: Every day | ORAL | Status: DC

## 2015-06-26 MED ORDER — MAGNESIUM SULFATE 2 GM/50ML IV SOLN
2.0000 g | Freq: Once | INTRAVENOUS | Status: AC
  Administered 2015-06-26: 2 g via INTRAVENOUS
  Filled 2015-06-26: qty 50

## 2015-06-26 MED ORDER — MAGNESIUM OXIDE 400 (241.3 MG) MG PO TABS
400.0000 mg | ORAL_TABLET | Freq: Two times a day (BID) | ORAL | Status: DC
  Administered 2015-06-26: 400 mg via ORAL
  Filled 2015-06-26 (×2): qty 1

## 2015-06-26 MED ORDER — BOOST / RESOURCE BREEZE PO LIQD: 1.0000 | Freq: Three times a day (TID) | ORAL | Status: DC

## 2015-06-26 MED ORDER — MAGNESIUM OXIDE 400 (241.3 MG) MG PO TABS: 400.0000 mg | ORAL_TABLET | Freq: Two times a day (BID) | ORAL | Status: DC

## 2015-06-26 MED ORDER — POTASSIUM CHLORIDE ER 20 MEQ PO TBCR: 10.0000 meq | EXTENDED_RELEASE_TABLET | Freq: Every day | ORAL | Status: DC

## 2015-06-26 MED ORDER — METOPROLOL TARTRATE 50 MG PO TABS: 50.0000 mg | ORAL_TABLET | Freq: Two times a day (BID) | ORAL | Status: DC

## 2015-06-26 MED ORDER — METOPROLOL TARTRATE 25 MG PO TABS: 50.0000 mg | ORAL_TABLET | Freq: Two times a day (BID) | ORAL | Status: DC

## 2015-06-26 MED ORDER — METOPROLOL TARTRATE 25 MG PO TABS
25.0000 mg | ORAL_TABLET | Freq: Once | ORAL | Status: AC
  Administered 2015-06-26: 25 mg via ORAL
  Filled 2015-06-26: qty 1

## 2015-06-26 MED ORDER — POTASSIUM CHLORIDE CRYS ER 20 MEQ PO TBCR
40.0000 meq | EXTENDED_RELEASE_TABLET | ORAL | Status: AC
  Administered 2015-06-26 (×2): 40 meq via ORAL
  Filled 2015-06-26 (×2): qty 2

## 2015-06-26 NOTE — Care Management Note (Signed)
Case Management Note  Patient Details  Name: Delsa Walder MRN: [id number] Date of Birth: 1935-06-22  Subjective/Objective:                    Action/Plan:d/c home w/HHPT-[facility] aware.   Expected Discharge Date:                  Expected Discharge Plan:  [place]  In-House Referral:  Clinical Social Work  Discharge planning Services  CM Consult  Post Acute Care Choice:  NA Choice offered to:  Patient  DME Arranged:  N/A DME Agency:  [place]:  PT [place]:  NA  Status of Service:  Completed, signed off  Medicare Important Message Given:  Yes-second notification given Date Medicare IM Given:    Medicare IM give by:    Date Additional Medicare IM Given:    Additional Medicare Important Message give by:     If discussed at [place] of Stay Meetings, dates discussed:    Additional Comments:  Dessa Phi, RN 06/26/2015, 11:17 AM

## 2015-06-26 NOTE — Discharge Summary (Signed)
Physician Discharge Summary  Dollene Mallery FAO:[id number] DOB: Sep 11, 1935 DOA: 06/21/2015  PCP: [name], MD  Admit date: 06/21/2015 Discharge date: 06/26/2015  Time spent: 60 minutes  Recommendations for Outpatient Follow-up:  1. Bmet (follow-up on potassium and renal function ) and Mg+ level on Monday  Discharge Condition: stable Diet recommendation: low sodium heart healthy  Discharge Diagnoses:  Principal Problem:   SIRS (systemic inflammatory response syndrome) Active Problems:  Hypokalemia Hypomagnesemia Acute renal failure   Paroxysmal a-fib   HTN (hypertension)   Long term current use of antiarrhythmic drug   Adenocarcinoma of upper lobe of right lung   Leukopenia due to antineoplastic chemotherapy    History of present illness:  Pamela Mcguire is a 80 y.o. female with adenocarcinoma of the lung who presented to the hospital with a fever. Temperature was 100.1. She was started on vancomycin and Zosyn empirically as she was immune compromised secondary to chemotherapy. She has not had a recurrence of fever. No source for fever has been found.  Hospital Course:  Principal Problem:  SIRS (systemic inflammatory response syndrome) -With tachypnea and low-grade fever of 100.1 rectally on admission --CT scan of the chest not suggestive of infection-no cough - Blood cultures negative -Urine culture from 7/5 revealed Proteus-she was treated with ciprofloxacin for this outpatient according to her daughter-- UA and admission mildly positive but culture shows multiple bacteria without any single predominant bacteria-patient had no complaints of dysuria which is her usual complaint when she has a UTI-has received 3 days of vancomycin and Zosyn- discontinued on 7/27- no recurrent fevers -Suspect fever was from a viral source  Active Problems: Deconditioning/weakness -According to the daughter the patient got extremely weak when she had the fever and has been laying in bed mostly  throughout the hospital stay-PT recommended home health PT but the patient's daughter declined this saying it would not be necessary  ARF - rise in Cr 1.37 from 0.74 noted on 7/28- may have been due to Vancomycin which was held on 7/27- repeat Bmet shows creatinine is improving slightly but has not normalized yet-follow-up be met next [facility] at cancer [facility]-discussed with daughter and patient   Paroxysmal a-fib- V. tach -Continue eliquis-.continue Lopressor -He had a short run of SVT a few days ago -she had a 6 beat run of V. tach today - have increase Lopressor to 50 mg twice a day-BP is high enough to tolerate this  V. Tach/hypokalemia/hypomagnesemia - Short 6 beat run of V. tach today - Has been mildly hypokalemic and hypomagnesemic throughout the hospital stay-I placed her on oral replacement potassium and magnesium-recommend check the Menest and magnesium level on Monday to ensure she is getting sufficient replacement-  Hypertension --Cont Lopressor- increased to 50 mg twice a day   Adenocarcinoma of upper lobe of right lung -Dr. Julien Nordmann aware of admission - She has been receiving daily radiation while in the hospital -Due for next chemotherapy on Monday-missed her chemotherapy this past Monday which would've been her last   Leukopenia due to antineoplastic chemotherapy -differential ordered by was not done by lab- WBC count is up to 3.0 today from 1.8 therefore doubtful that she is neutropenic- does not need Granix  Dehydration with poor by mouth intake in the hospital -Continued slow IV fluidsthroughout the hospital stay  - daughter states she eats better at home than she does in the hospital-usually has no trouble with eating at home  Disposition: Plan discussed with patient and daughters in detail daily Procedures:  none (  i.e. Studies not automatically included, echos, thoracentesis, etc; not x-rays)  Consultations:  none  Discharge Exam: Filed Weights   06/21/15  2041 06/22/15 1133 06/22/15 1500  Weight: 82.555 kg (182 lb) 84.6 kg (186 lb 8.2 oz) 84.6 kg (186 lb 8.2 oz)   Filed Vitals:   06/26/15 1026  BP: 156/75  Pulse: 93  Temp: 98.1 F (36.7 C)  Resp: 18    General: AAO x 3, no distress Cardiovascular: RRR, no murmurs  Respiratory: clear to auscultation bilaterally GI: soft, non-tender, non-distended, bowel sound positive  Discharge Instructions You were cared for by a hospitalist during your hospital stay. If you have any questions about your discharge medications or the care you received while you were in the hospital after you are discharged, you can call the unit and asked to speak with the hospitalist on call if the hospitalist that took care of you is not available. Once you are discharged, your primary care physician will handle any further medical issues. Please note that NO REFILLS for any discharge medications will be authorized once you are discharged, as it is imperative that you return to your primary care physician (or establish a relationship with a primary care physician if you do not have one) for your aftercare needs so that they can reassess your need for medications and monitor your lab values.  Discharge Instructions    Diet - low sodium heart healthy    Complete by:  As directed      Increase activity slowly    Complete by:  As directed             Medication List    TAKE these medications        acetaminophen 500 MG tablet  Commonly known as:  TYLENOL  Take 500-1,000 mg by mouth every 6 (six) hours as needed for mild pain or headache.     albuterol 108 (90 BASE) MCG/ACT inhaler  Commonly known as:  PROVENTIL HFA;VENTOLIN HFA  Inhale 1 puff into the lungs every 6 (six) hours as needed for wheezing or shortness of breath.     ELIQUIS 5 MG Tabs tablet  Generic drug:  apixaban  TAKE 1 TABLET BY MOUTH TWICE DAILY     emollient cream  Commonly known as:  BIAFINE  Apply topically as needed.     escitalopram  10 MG tablet  Commonly known as:  LEXAPRO  Take 1 tablet (10 mg total) by mouth daily.     ezetimibe-simvastatin 10-40 MG per tablet  Commonly known as:  VYTORIN  Take 1 tablet by mouth daily.     feeding supplement Liqd  Take 1 Container by mouth 3 (three) times daily between meals.     Fish Oil 1200 MG Caps  Take 2 capsules by mouth daily.     HYDROcodone-acetaminophen 5-325 MG per tablet  Commonly known as:  NORCO/VICODIN  Take 1 tablet by mouth every 6 (six) hours as needed for moderate pain.     loratadine 10 MG tablet  Commonly known as:  CLARITIN  Take 10 mg by mouth at bedtime as needed for allergies.     LORazepam 1 MG tablet  Commonly known as:  ATIVAN  Take 1 tablet (1 mg total) by mouth at bedtime.     metoprolol tartrate 25 MG tablet  Commonly known as:  LOPRESSOR  Take 2 tablets (50 mg total) by mouth 2 (two) times daily.     mometasone-formoterol 100-5 MCG/ACT Aero  Commonly  known as:  DULERA  Inhale 2 puffs into the lungs 2 (two) times daily as needed for wheezing.     pantoprazole 40 MG tablet  Commonly known as:  PROTONIX  Take 40 mg by mouth daily.     PROBIOTIC PO  Take 1 capsule by mouth 2 (two) times daily.     prochlorperazine 10 MG tablet  Commonly known as:  COMPAZINE  Take 1 tablet (10 mg total) by mouth every 6 (six) hours as needed for nausea or vomiting.     RADIAPLEX EX  Apply topically.     sucralfate 1 G tablet  Commonly known as:  CARAFATE  Take 1 tablet (1 g total) by mouth 4 (four) times daily -  with meals and at bedtime. 5 min before meals for radiation induced esophagitis     VITAMIN B-12 PO  Take 1 capsule by mouth every morning.     VITAMIN D PO  Take 1 tablet by mouth every morning.       No Known Allergies     Follow-up Information    Follow up with [facility].   Why:  [facility]   Contact information:   [address] [place] [address] [phone]        The results of  significant diagnostics from this hospitalization (including imaging, microbiology, ancillary and laboratory) are listed below for reference.    Significant Diagnostic Studies: Dg Chest 2 View  06/21/2015   CLINICAL DATA:  Lung cancer. Chest pains today. Dyspnea. Tachypnea.  EXAM: CHEST  2 VIEW  COMPARISON:  05/13/2015  FINDINGS: The right upper lobe mass is unchanged. There is no evidence of a superimposed acute infiltrate or congestive heart failure. Pulmonary vasculature is normal. There are no pleural effusions. Heart size is normal and unchanged.  IMPRESSION: Known right upper lobe mass.  No superimposed acute findings.   Electronically Signed   By: Andreas Newport M.D.   On: 06/21/2015 21:21   Ct Angio Chest Pe W/cm &/or Wo Cm  06/22/2015   CLINICAL DATA:  Tachycardia and dyspnea. Ongoing chemotherapy for lung cancer.  EXAM: CT ANGIOGRAPHY CHEST WITH CONTRAST  TECHNIQUE: Multidetector CT imaging of the chest was performed using the standard protocol during bolus administration of intravenous contrast. Multiplanar CT image reconstructions and MIPs were obtained to evaluate the vascular anatomy.  CONTRAST:  162mL OMNIPAQUE IOHEXOL 350 MG/ML SOLN  COMPARISON:  PET 04/10/2015  FINDINGS: Cardiovascular: There is good opacification of the pulmonary arteries. There is no pulmonary embolism. The thoracic aorta is normal in caliber and intact. There are atherosclerotic calcifications of the aorta and Coronary arteries.  Lungs: The right apical lung mass is slightly smaller, measuring 8.4 x 16.9 mm and previously measuring 12.9 x 18.1 mm on 04/10/2015. Chronic fibrotic appearing interstitial coarsening is again evident. No superimposed acute disease is evident.  Central airways: Patent  Effusions: None  Lymphadenopathy: Unchanged mediastinal and right hilar adenopathy  Esophagus: Unremarkable  Upper abdomen: Small hiatal hernia  Musculoskeletal: No significant skeletal lesion. Moderate kyphosis and degenerative  disc disease.  Review of the MIP images confirms the above findings.  IMPRESSION: 1. Negative for acute pulmonary embolism. There is moderate atherosclerotic calcification of the aorta and Coronary arteries. 2. Mild size reduction of the right apical lung mass. Unchanged mediastinal and hilar adenopathy. 3. No acute findings are evident.   Electronically Signed   By: Andreas Newport M.D.   On: 06/22/2015 01:06    Microbiology: Recent  Results (from the past 240 hour(s))  Culture, blood (routine x 2)     Status: None (Preliminary result)   Collection Time: 06/21/15  9:35 PM  Result Value Ref Range Status   Specimen Description BLOOD LEFT ARM  Final   Special Requests BOTTLES DRAWN AEROBIC AND ANAEROBIC 5ML  Final   Culture   Final    NO GROWTH 3 DAYS Performed at [facility]    Report Status PENDING  Incomplete  Urine culture     Status: None   Collection Time: 06/21/15 10:31 PM  Result Value Ref Range Status   Specimen Description URINE, CLEAN CATCH  Final   Special Requests NONE  Final   Culture   Final    MULTIPLE SPECIES PRESENT, SUGGEST RECOLLECTION Performed at [facility]    Report Status 06/23/2015 FINAL  Final  Culture, blood (routine x 2)     Status: None (Preliminary result)   Collection Time: 06/21/15 10:34 PM  Result Value Ref Range Status   Specimen Description BLOOD BLOOD LEFT FOREARM  Final   Special Requests BOTTLES DRAWN AEROBIC AND ANAEROBIC 5ML  Final   Culture   Final    NO GROWTH 3 DAYS Performed at [facility]    Report Status PENDING  Incomplete  MRSA PCR Screening     Status: None   Collection Time: 06/22/15  2:41 AM  Result Value Ref Range Status   MRSA by PCR NEGATIVE NEGATIVE Final    Comment:        The GeneXpert MRSA Assay (FDA approved for NASAL specimens only), is one component of a comprehensive MRSA colonization surveillance program. It is not intended to diagnose MRSA infection nor to guide or monitor treatment  for MRSA infections.      Labs: Basic Metabolic Panel:  Recent Labs Lab 06/23/15 0516 06/24/15 0619 06/24/15 2223 06/25/15 0537 06/25/15 1216 06/26/15 0820  NA 138 139  --  141 140 138  K 3.2* 3.0*  --  3.6 3.7 3.4*  CL 107 108  --  113* 112* 110  CO2 24 25  --  21* 19* 21*  GLUCOSE 121* 168*  --  140* 137* 125*  BUN 6 6  --  7 6 5*  CREATININE 0.69 0.74  --  1.38* 1.37* 1.27*  CALCIUM 8.4* 8.1*  --  8.6* 8.9 8.2*  MG  --   --  1.2*  --   --   --    Liver Function Tests:  Recent Labs Lab 06/21/15 2233 06/23/15 0516  AST 16 16  ALT 15 15  ALKPHOS 34* 30*  BILITOT 0.6 0.7  PROT 6.1* 5.8*  ALBUMIN 3.4* 2.9*   No results for input(s): LIPASE, AMYLASE in the last 168 hours. No results for input(s): AMMONIA in the last 168 hours. CBC:  Recent Labs Lab 06/21/15 2233 06/22/15 0400 06/23/15 0516 06/24/15 0619 06/25/15 0605 06/26/15 0820  WBC 3.0* 1.9* 1.9* 1.8* 2.9* 3.0*  NEUTROABS 2.4  --   --   --   --   --   HGB 10.6* 10.2* 10.3* 9.4* 10.4* 9.8*  HCT 32.1* 30.8* 30.8* 29.2* 31.6* 28.3*  MCV 85.4 89.0 87.3 86.1 89.0 84.7  PLT 191 173 187 188 229 227   Cardiac Enzymes:  Recent Labs Lab 06/21/15 2233  TROPONINI <0.03   BNP: BNP (last 3 results) No results for input(s): BNP in the last 8760 hours.  ProBNP (last 3 results) No results for input(s):  PROBNP in the last 8760 hours.  CBG: No results for input(s): GLUCAP in the last 168 hours.     SignedDebbe Odea, MD [facility] 06/26/2015, 10:46 AM

## 2015-06-27 ENCOUNTER — Other Ambulatory Visit: Payer: Self-pay | Admitting: Internal Medicine

## 2015-06-27 LAB — CULTURE, BLOOD (ROUTINE X 2)
Culture: NO GROWTH
Culture: NO GROWTH

## 2015-06-29 ENCOUNTER — Ambulatory Visit
Admission: RE | Admit: 2015-06-29 | Discharge: 2015-06-29 | Disposition: A | Discharge status: Home / Self Care | Payer: Medicare HMO | Source: Ambulatory Visit | Attending: Radiation Oncology | Admitting: Radiation Oncology

## 2015-06-29 ENCOUNTER — Ambulatory Visit: Payer: Medicare HMO

## 2015-06-29 ENCOUNTER — Encounter: Payer: Self-pay | Admitting: Radiation Oncology

## 2015-06-29 ENCOUNTER — Encounter: Payer: Self-pay | Admitting: Physician Assistant

## 2015-06-29 ENCOUNTER — Other Ambulatory Visit (HOSPITAL_BASED_OUTPATIENT_CLINIC_OR_DEPARTMENT_OTHER): Payer: Medicare HMO

## 2015-06-29 ENCOUNTER — Ambulatory Visit (HOSPITAL_BASED_OUTPATIENT_CLINIC_OR_DEPARTMENT_OTHER): Payer: Medicare HMO | Admitting: Physician Assistant

## 2015-06-29 ENCOUNTER — Encounter: Payer: Medicare HMO | Admitting: Nutrition

## 2015-06-29 VITALS — BP 132/66 | HR 89 | Temp 98.0°F | Resp 18 | Ht 66.0 in | Wt 183.8 lb

## 2015-06-29 VITALS — BP 132/66 | HR 89 | Temp 98.0°F | Resp 18 | Wt 183.0 lb

## 2015-06-29 DIAGNOSIS — C771 Secondary and unspecified malignant neoplasm of intrathoracic lymph nodes: Secondary | ICD-10-CM | POA: Diagnosis not present

## 2015-06-29 DIAGNOSIS — E785 Hyperlipidemia, unspecified: Secondary | ICD-10-CM | POA: Diagnosis not present

## 2015-06-29 DIAGNOSIS — M1990 Unspecified osteoarthritis, unspecified site: Secondary | ICD-10-CM | POA: Diagnosis not present

## 2015-06-29 DIAGNOSIS — C3411 Malignant neoplasm of upper lobe, right bronchus or lung: Secondary | ICD-10-CM | POA: Diagnosis not present

## 2015-06-29 DIAGNOSIS — G4730 Sleep apnea, unspecified: Secondary | ICD-10-CM | POA: Diagnosis not present

## 2015-06-29 DIAGNOSIS — Z853 Personal history of malignant neoplasm of breast: Secondary | ICD-10-CM | POA: Diagnosis not present

## 2015-06-29 DIAGNOSIS — I2510 Atherosclerotic heart disease of native coronary artery without angina pectoris: Secondary | ICD-10-CM | POA: Diagnosis not present

## 2015-06-29 DIAGNOSIS — F419 Anxiety disorder, unspecified: Secondary | ICD-10-CM | POA: Diagnosis not present

## 2015-06-29 DIAGNOSIS — Z87891 Personal history of nicotine dependence: Secondary | ICD-10-CM | POA: Diagnosis not present

## 2015-06-29 DIAGNOSIS — C3410 Malignant neoplasm of upper lobe, unspecified bronchus or lung: Secondary | ICD-10-CM | POA: Diagnosis present

## 2015-06-29 DIAGNOSIS — C3491 Malignant neoplasm of unspecified part of right bronchus or lung: Secondary | ICD-10-CM

## 2015-06-29 DIAGNOSIS — I10 Essential (primary) hypertension: Secondary | ICD-10-CM | POA: Diagnosis not present

## 2015-06-29 DIAGNOSIS — K219 Gastro-esophageal reflux disease without esophagitis: Secondary | ICD-10-CM | POA: Diagnosis not present

## 2015-06-29 DIAGNOSIS — I4891 Unspecified atrial fibrillation: Secondary | ICD-10-CM | POA: Diagnosis not present

## 2015-06-29 DIAGNOSIS — Z510 Encounter for antineoplastic radiation therapy: Secondary | ICD-10-CM | POA: Diagnosis not present

## 2015-06-29 LAB — CBC WITH DIFFERENTIAL/PLATELET
BASO%: 0.4 % (ref 0.0–2.0)
BASOS ABS: 0 10*3/uL (ref 0.0–0.1)
EOS%: 0.8 % (ref 0.0–7.0)
Eosinophils Absolute: 0 10*3/uL (ref 0.0–0.5)
HEMATOCRIT: 31 % — AB (ref 34.8–46.6)
HGB: 10.3 g/dL — ABNORMAL LOW (ref 11.6–15.9)
LYMPH#: 0.4 10*3/uL — AB (ref 0.9–3.3)
LYMPH%: 14.6 % (ref 14.0–49.7)
MCH: 28.9 pg (ref 25.1–34.0)
MCHC: 33.2 g/dL (ref 31.5–36.0)
MCV: 86.8 fL (ref 79.5–101.0)
MONO#: 0.3 10*3/uL (ref 0.1–0.9)
MONO%: 11.8 % (ref 0.0–14.0)
NEUT#: 1.8 10*3/uL (ref 1.5–6.5)
NEUT%: 72.4 % (ref 38.4–76.8)
Platelets: 280 10*3/uL (ref 145–400)
RBC: 3.57 10*6/uL — ABNORMAL LOW (ref 3.70–5.45)
RDW: 15.7 % — AB (ref 11.2–14.5)
WBC: 2.5 10*3/uL — ABNORMAL LOW (ref 3.9–10.3)

## 2015-06-29 LAB — COMPREHENSIVE METABOLIC PANEL (CC13)
ALT: 10 U/L (ref 0–55)
ANION GAP: 11 meq/L (ref 3–11)
AST: 11 U/L (ref 5–34)
Albumin: 2.8 g/dL — ABNORMAL LOW (ref 3.5–5.0)
Alkaline Phosphatase: 41 U/L (ref 40–150)
BUN: 10.5 mg/dL (ref 7.0–26.0)
CALCIUM: 9.1 mg/dL (ref 8.4–10.4)
CO2: 25 mEq/L (ref 22–29)
Chloride: 102 mEq/L (ref 98–109)
Creatinine: 1.2 mg/dL — ABNORMAL HIGH (ref 0.6–1.1)
EGFR: 42 mL/min/{1.73_m2} — ABNORMAL LOW (ref >90)
Glucose: 179 mg/dl — ABNORMAL HIGH (ref 70–140)
POTASSIUM: 3.5 meq/L (ref 3.5–5.1)
SODIUM: 138 meq/L (ref 136–145)
Total Bilirubin: 0.46 mg/dL (ref 0.20–1.20)
Total Protein: 6.3 g/dL — ABNORMAL LOW (ref 6.4–8.3)

## 2015-06-29 MED ORDER — HYDROCODONE-ACETAMINOPHEN 5-325 MG PO TABS: 1.0000 | ORAL_TABLET | Freq: Four times a day (QID) | ORAL | Status: DC | PRN

## 2015-06-29 NOTE — Progress Notes (Addendum)
No images are attached to the encounter. No scans are attached to the encounter. No scans are attached to the encounter. [place] VISIT PROGRESS NOTE  Pamela Mcguire, [place] [address]  DIAGNOSIS: Non-small cell carcinoma of lung, stage 3   Staging form: Lung, AJCC 7th Edition     Clinical stage from 04/30/2015: Stage IIIA (T1a, N2, M0) - Signed by Pamela Bears, MD on 04/30/2015  PRIOR THERAPY: None  CURRENT THERAPY: Concurrent chemoradiation with chemotherapy in the form of weekly carboplatin for an AUC of 2 and paclitaxel at 45 mg/m given concurrent with radiation.  INTERVAL HISTORY: Pamela Mcguire 80 y.o. female returns for a scheduled regular office visit for followup of her recently diagnosed stage IIIa non-small cell lung cancer. She is accompanied by one of her daughters She is currently undergoing a course of concurrent chemoradiation. She was recently admitted with systemic inflammatory response syndrome as well as a low-grade fever. She was treated with IV antibodies. She was discharged on 06/26/2015. She is scheduled to complete her course of concurrent chemoradiation with the last fraction of radiation on 07/02/2015. She reports having some discomfort with swallowing is currently on Carafate. She also has some skin changes on her chest and back related to radiation. She was advised to inform the radiation oncology Department of her continued discomfort with swallowing as well as her skin issues related to radiation therapy. She continues to experience some fatigue but is otherwise tolerating her course of concurrent chemoradiation relatively well.   She continues on oxygen at 2 L via nasal cannula. She reports feeling cold and has been having some hyperventilation/anxiety type episodes. She reports some episodes of looser than usual and more frequent bowel movements. This is likely related to her recent treatment  with anti-biotics. She voices no other specific complaints today.  MEDICAL HISTORY: Past Medical History  Diagnosis Date  . Hyperlipidemia   . Diverticulitis   . CAD (coronary artery disease)   . AF (atrial fibrillation)   . Hypertension   . Seasonal allergies   . Prediabetes   . Depression   . Shortness of breath dyspnea   . Anxiety   . Urinary frequency   . GERD (gastroesophageal reflux disease)   . Breast cancer     right mastectomy  . Sleep apnea      dx'd 10/2014, has not received cpap yet (04/28/2015)  . Arthritis     "thighs, hips, arms" (04/28/2015)  . Non-small cell carcinoma of lung, stage 3 04/30/2015    ALLERGIES:  has No Known Allergies.  MEDICATIONS:  Current Outpatient Prescriptions  Medication Sig Dispense Refill  . acetaminophen (TYLENOL) 500 MG tablet Take 500-1,000 mg by mouth every 6 (six) hours as needed for mild pain or headache.    . albuterol (PROVENTIL HFA;VENTOLIN HFA) 108 (90 BASE) MCG/ACT inhaler Inhale 1 puff into the lungs every 6 (six) hours as needed for wheezing or shortness of breath.    . Cholecalciferol (VITAMIN D PO) Take 1 tablet by mouth every morning.     . Cyanocobalamin (VITAMIN B-12 PO) Take 1 capsule by mouth every morning.    Marland Kitchen ELIQUIS 5 MG TABS tablet TAKE 1 TABLET BY MOUTH TWICE DAILY 60 tablet 5  . emollient (BIAFINE) cream Apply topically as needed. (Patient taking differently: Apply 1 application topically daily as needed. ) 454 g 0  . escitalopram (LEXAPRO) 10 MG tablet Take 1 tablet (10 mg  total) by mouth daily. 30 tablet 5  . ezetimibe-simvastatin (VYTORIN) 10-40 MG per tablet Take 1 tablet by mouth daily. 90 tablet 3  . feeding supplement (BOOST / RESOURCE BREEZE) LIQD Take 1 Container by mouth 3 (three) times daily between meals. 90 Container 0  . HYDROcodone-acetaminophen (NORCO/VICODIN) 5-325 MG per tablet Take 1 tablet by mouth every 6 (six) hours as needed for moderate pain. 30 tablet 0  . loratadine (CLARITIN) 10 MG  tablet Take 10 mg by mouth at bedtime as needed for allergies.    Marland Kitchen LORazepam (ATIVAN) 1 MG tablet Take 1 tablet (1 mg total) by mouth at bedtime. 45 tablet 0  . magnesium oxide (MAG-OX) 400 (241.3 MG) MG tablet Take 1 tablet (400 mg total) by mouth 2 (two) times daily. 60 tablet 0  . metoprolol tartrate (LOPRESSOR) 25 MG tablet Take 2 tablets (50 mg total) by mouth 2 (two) times daily. 180 tablet 2  . mometasone-formoterol (DULERA) 100-5 MCG/ACT AERO Inhale 2 puffs into the lungs 2 (two) times daily as needed for wheezing.     . Omega-3 Fatty Acids (FISH OIL) 1200 MG CAPS Take 2 capsules by mouth daily.    . pantoprazole (PROTONIX) 40 MG tablet Take 40 mg by mouth daily.    . potassium chloride 20 MEQ TBCR Take 10 mEq by mouth daily. 30 tablet 0  . Probiotic Product (PROBIOTIC PO) Take 1 capsule by mouth 2 (two) times daily.    . prochlorperazine (COMPAZINE) 10 MG tablet Take 1 tablet (10 mg total) by mouth every 6 (six) hours as needed for nausea or vomiting. 30 tablet 0  . sucralfate (CARAFATE) 1 G tablet Take 1 tablet (1 g total) by mouth 4 (four) times daily -  with meals and at bedtime. 5 min before meals for radiation induced esophagitis 120 tablet 2  . Wound Cleansers (RADIAPLEX EX) Apply topically.     No current facility-administered medications for this visit.    SURGICAL HISTORY:  Past Surgical History  Procedure Laterality Date  . Cardioversion    . Tonsillectomy    . Colonoscopy    . Cesarean section  X 2  . Mastectomy Right   . Breast surgery    . Mediastinoscopy N/A 04/28/2015    Procedure: MEDIASTINOSCOPY;  Surgeon: Pamela Pollack, MD;  Location: [facility] OR;  Service: Thoracic;  Laterality: N/A;    REVIEW OF SYSTEMS:  Review of Systems  Constitutional: Negative for fever, chills, weight loss, malaise/fatigue and diaphoresis.       Recently admitted with low grade fever- now resolved  HENT: Negative for congestion, ear discharge, ear pain, hearing loss, nosebleeds, sore  throat and tinnitus.   Eyes: Negative for blurred vision, double vision, photophobia, pain, discharge and redness.  Respiratory: Negative for cough, hemoptysis, sputum production, shortness of breath, wheezing and stridor.   Cardiovascular: Negative for chest pain, palpitations, orthopnea, claudication, leg swelling and PND.  Gastrointestinal: Positive for diarrhea. Negative for heartburn, abdominal pain, constipation, blood in stool and melena.       Having more frequent (3), looser than usual bowel movements per day since discharge from the hospital-likely secondary to recent antibiotics  Genitourinary: Negative.   Musculoskeletal: Negative.   Skin: Negative.   Neurological: Negative for dizziness, tingling, focal weakness, seizures, weakness and headaches.  Endo/Heme/Allergies: Does not bruise/bleed easily.  Psychiatric/Behavioral: Negative for depression. The patient is not nervous/anxious and does not have insomnia.      PHYSICAL EXAMINATION: Physical Exam  Constitutional: She  is oriented to person, place, and time and well-developed, well-nourished, and in no distress.  Comfortably seated and wheelchair on 2 L of oxygen via nasal cannula  HENT:  Head: Normocephalic and atraumatic.  Mouth/Throat: Oropharynx is clear and moist.  Eyes: Pupils are equal, round, and reactive to light.  Neck: Normal range of motion. Neck supple. No JVD present. No tracheal deviation present. No thyromegaly present.  Cardiovascular: Normal rate, regular rhythm, normal heart sounds and intact distal pulses.  Exam reveals no gallop and no friction rub.   No murmur heard. Pulmonary/Chest: Effort normal and breath sounds normal. No respiratory distress. She has no wheezes. She has no rales.  Abdominal: Soft. Bowel sounds are normal. She exhibits no distension and no mass. There is no tenderness.  Musculoskeletal: Normal range of motion. She exhibits no edema or tenderness.  Lymphadenopathy:    She has no  cervical adenopathy.  Neurological: She is alert and oriented to person, place, and time. She has normal reflexes. Gait normal.  Skin: Skin is warm and dry. No rash noted.    ECOG PERFORMANCE STATUS: 2 - Symptomatic, <50% confined to bed  Blood pressure 132/66, pulse 89, temperature 98 F (36.7 C), temperature source Oral, resp. rate 18, height '5\' 6"'$  (1.676 m), weight 183 lb 12.8 oz (83.371 kg), SpO2 96 %.  LABORATORY DATA: Lab Results  Component Value Date   WBC 2.5* 06/29/2015   HGB 10.3* 06/29/2015   HCT 31.0* 06/29/2015   MCV 86.8 06/29/2015   PLT 280 06/29/2015      Chemistry      Component Value Date/Time   NA 138 06/29/2015 0947   NA 138 06/26/2015 0820   K 3.5 06/29/2015 0947   K 3.4* 06/26/2015 0820   CL 110 06/26/2015 0820   CO2 25 06/29/2015 0947   CO2 21* 06/26/2015 0820   BUN 10.5 06/29/2015 0947   BUN 5* 06/26/2015 0820   CREATININE 1.2* 06/29/2015 0947   CREATININE 1.27* 06/26/2015 0820   CREATININE 0.91 06/11/2014 1608      Component Value Date/Time   CALCIUM 9.1 06/29/2015 0947   CALCIUM 8.2* 06/26/2015 0820   ALKPHOS 41 06/29/2015 0947   ALKPHOS 30* 06/23/2015 0516   AST 11 06/29/2015 0947   AST 16 06/23/2015 0516   ALT 10 06/29/2015 0947   ALT 15 06/23/2015 0516   BILITOT 0.46 06/29/2015 0947   BILITOT 0.7 06/23/2015 0516       RADIOGRAPHIC STUDIES:  Dg Chest 2 View  06/21/2015   CLINICAL DATA:  Lung cancer. Chest pains today. Dyspnea. Tachypnea.  EXAM: CHEST  2 VIEW  COMPARISON:  05/13/2015  FINDINGS: The right upper lobe mass is unchanged. There is no evidence of a superimposed acute infiltrate or congestive heart failure. Pulmonary vasculature is normal. There are no pleural effusions. Heart size is normal and unchanged.  IMPRESSION: Known right upper lobe mass.  No superimposed acute findings.   Electronically Signed   By: Andreas Newport M.D.   On: 06/21/2015 21:21   Ct Angio Chest Pe W/cm &/or Wo Cm  06/22/2015   CLINICAL DATA:   Tachycardia and dyspnea. Ongoing chemotherapy for lung cancer.  EXAM: CT ANGIOGRAPHY CHEST WITH CONTRAST  TECHNIQUE: Multidetector CT imaging of the chest was performed using the standard protocol during bolus administration of intravenous contrast. Multiplanar CT image reconstructions and MIPs were obtained to evaluate the vascular anatomy.  CONTRAST:  11m OMNIPAQUE IOHEXOL 350 MG/ML SOLN  COMPARISON:  PET 04/10/2015  FINDINGS:  Cardiovascular: There is good opacification of the pulmonary arteries. There is no pulmonary embolism. The thoracic aorta is normal in caliber and intact. There are atherosclerotic calcifications of the aorta and Coronary arteries.  Lungs: The right apical lung mass is slightly smaller, measuring 8.4 x 16.9 mm and previously measuring 12.9 x 18.1 mm on 04/10/2015. Chronic fibrotic appearing interstitial coarsening is again evident. No superimposed acute disease is evident.  Central airways: Patent  Effusions: None  Lymphadenopathy: Unchanged mediastinal and right hilar adenopathy  Esophagus: Unremarkable  Upper abdomen: Small hiatal hernia  Musculoskeletal: No significant skeletal lesion. Moderate kyphosis and degenerative disc disease.  Review of the MIP images confirms the above findings.  IMPRESSION: 1. Negative for acute pulmonary embolism. There is moderate atherosclerotic calcification of the aorta and Coronary arteries. 2. Mild size reduction of the right apical lung mass. Unchanged mediastinal and hilar adenopathy. 3. No acute findings are evident.   Electronically Signed   By: Andreas Newport M.D.   On: 06/22/2015 01:06     ASSESSMENT/PLAN:  No problem-specific assessment & plan notes found for this encounter.  the patient is a pleasant 80 year old Caucasian female recently diagnosed with stage IIIa non-small cell lung cancer. She is currently on oxygen at 2 L via nasal cannula. She is recovering from her recent hospital admission. She will finish her last fraction of  radiation on 07/02/2015. We will omit this last weekly low-dose chemotherapy. She will complete her remaining radiation fractions as scheduled. She will follow-up and approximate 4-5 weeks with a restaging CT scan of the chest with contrast to reevaluate her disease. The patient was discussed with and also seen by Dr. Julien Nordmann.   Pamela Metro E, PA-C 06/29/2015  All questions were answered. The patient knows to call the clinic with any problems, questions or concerns. We can certainly see the patient much sooner if necessary.  ADDENDUM: Hematology/Oncology Attending: I had a face to face encounter with the patient. I recommended her care plan. This is a very pleasant 80 years old white female with a stage IIIa non-small cell lung cancer who is currently undergoing a course of concurrent chemoradiation with weekly carboplatin and paclitaxel. The patient was recently admitted to [facility] with neutropenic fever and questionable sepsis. She was recently discharged and feeling much better. Her total white blood count had improved. She will complete the course of radiotherapy later this week. I recommended for the patient to schedule last cycle of her systemic chemotherapy today because of the recent admission and need for recovery. We will see her back for follow-up visit in 5 weeks for reevaluation with repeat CT scan of the chest for restaging of her disease. She was advised to call immediately if she has any concerning symptoms in the interval.  Disclaimer: This note was dictated with voice recognition software. Similar sounding words can inadvertently be transcribed and may be missed upon review. Eilleen Kempf., MD 06/29/2015

## 2015-06-29 NOTE — Progress Notes (Signed)
Department of Radiation Oncology  Phone:  [phone] Fax:        [phone]  Weekly Treatment Note    Name: Pamela Mcguire Date: 06/29/2015 MRN: [id number] DOB: September 17, 1935   Current dose: 60 Gy  Current fraction: 30   MEDICATIONS: Current Outpatient Prescriptions  Medication Sig Dispense Refill  . acetaminophen (TYLENOL) 500 MG tablet Take 500-1,000 mg by mouth every 6 (six) hours as needed for mild pain or headache.    . albuterol (PROVENTIL HFA;VENTOLIN HFA) 108 (90 BASE) MCG/ACT inhaler Inhale 1 puff into the lungs every 6 (six) hours as needed for wheezing or shortness of breath.    . Cholecalciferol (VITAMIN D PO) Take 1 tablet by mouth every morning.     . Cyanocobalamin (VITAMIN B-12 PO) Take 1 capsule by mouth every morning.    Marland Kitchen ELIQUIS 5 MG TABS tablet TAKE 1 TABLET BY MOUTH TWICE DAILY 60 tablet 5  . emollient (BIAFINE) cream Apply topically as needed. (Patient taking differently: Apply 1 application topically daily as needed. ) 454 g 0  . escitalopram (LEXAPRO) 10 MG tablet Take 1 tablet (10 mg total) by mouth daily. 30 tablet 5  . ezetimibe-simvastatin (VYTORIN) 10-40 MG per tablet Take 1 tablet by mouth daily. 90 tablet 3  . feeding supplement (BOOST / RESOURCE BREEZE) LIQD Take 1 Container by mouth 3 (three) times daily between meals. 90 Container 0  . HYDROcodone-acetaminophen (NORCO/VICODIN) 5-325 MG per tablet Take 1 tablet by mouth every 6 (six) hours as needed for moderate pain. 30 tablet 0  . loratadine (CLARITIN) 10 MG tablet Take 10 mg by mouth at bedtime as needed for allergies.    Marland Kitchen LORazepam (ATIVAN) 1 MG tablet Take 1 tablet (1 mg total) by mouth at bedtime. 45 tablet 0  . magnesium oxide (MAG-OX) 400 (241.3 MG) MG tablet Take 1 tablet (400 mg total) by mouth 2 (two) times daily. 60 tablet 0  . metoprolol tartrate (LOPRESSOR) 25 MG tablet Take 2 tablets (50 mg total) by mouth 2 (two) times daily. 180 tablet 2  . mometasone-formoterol (DULERA) 100-5  MCG/ACT AERO Inhale 2 puffs into the lungs 2 (two) times daily as needed for wheezing.     . Omega-3 Fatty Acids (FISH OIL) 1200 MG CAPS Take 2 capsules by mouth daily.    . pantoprazole (PROTONIX) 40 MG tablet Take 40 mg by mouth daily.    . potassium chloride 20 MEQ TBCR Take 10 mEq by mouth daily. 30 tablet 0  . Probiotic Product (PROBIOTIC PO) Take 1 capsule by mouth 2 (two) times daily.    . prochlorperazine (COMPAZINE) 10 MG tablet Take 1 tablet (10 mg total) by mouth every 6 (six) hours as needed for nausea or vomiting. 30 tablet 0  . sucralfate (CARAFATE) 1 G tablet Take 1 tablet (1 g total) by mouth 4 (four) times daily -  with meals and at bedtime. 5 min before meals for radiation induced esophagitis 120 tablet 2  . Wound Cleansers (RADIAPLEX EX) Apply topically.     No current facility-administered medications for this encounter.     ALLERGIES: Review of patient's allergies indicates no known allergies.   LABORATORY DATA:  Lab Results  Component Value Date   WBC 2.5* 06/29/2015   HGB 10.3* 06/29/2015   HCT 31.0* 06/29/2015   MCV 86.8 06/29/2015   PLT 280 06/29/2015   Lab Results  Component Value Date   NA 138 06/29/2015   K 3.5 06/29/2015   CL 110  06/26/2015   CO2 25 06/29/2015   Lab Results  Component Value Date   ALT 10 06/29/2015   AST 11 06/29/2015   ALKPHOS 41 06/29/2015   BILITOT 0.46 06/29/2015     NARRATIVE: Pamela Mcguire was seen today for weekly treatment management. The chart was checked and the patient's films were reviewed.  Weight and vitals stable. Requesting to be seen by physician due to painful and difficult swallowing. Reports using Carafate four times daily before meals without relief. Recently discharge from the hospital for temperature and high lactic acid levels. NKDA. Reports chemotherapy was held today.  The patient was given a prescription earlier today for pain medicine. She has not been using pain medicine except at night. We discussed  that she can use this as written every 6 hours which would be helpful during the day.   PHYSICAL EXAMINATION: weight is 183 lb (83.008 kg). Her oral temperature is 98 F (36.7 C). Her blood pressure is 132/66 and her pulse is 89. Her respiration is 18 and oxygen saturation is 96%.        ASSESSMENT: The patient is doing satisfactorily with treatment.   PLAN: We will continue with the patient's radiation treatment as planned. The patient will increase her use of pain medication and continue Carafate. She will also take an anti-reflux medication. She will let us know if this is not sufficient with regards to her discomfort.      ------------------------------------------------  Jodelle Gross, MD, PhD  This document serves as a record of services personally performed by Kyung Rudd, MD. It was created on his behalf by Derek Mound, a trained medical scribe. The creation of this record is based on the scribe's personal observations and the provider's statements to them. This document has been checked and approved by the attending provider.

## 2015-06-29 NOTE — Telephone Encounter (Signed)
Appointments made and avs pritned

## 2015-06-29 NOTE — Progress Notes (Addendum)
Weight and vitals stable. Requesting to be seen by physician due to painful and difficult swallowing. Reports using Carafate four times daily before meals without relief. Recently discharge from the hospital for temperature and high lactic acid levels. NKDA. Reports chemotherapy was held today.   BP 132/66 mmHg  Pulse 89  Temp(Src) 98 F (36.7 C) (Oral)  Resp 18  Wt 183 lb (83.008 kg)  SpO2 96% Wt Readings from Last 3 Encounters:  06/29/15 183 lb (83.008 kg)  06/29/15 183 lb 12.8 oz (83.371 kg)  06/22/15 186 lb 8.2 oz (84.6 kg)

## 2015-06-29 NOTE — Patient Instructions (Signed)
As you just been recently discharged from the hospital, we will omit your last weekly cycle of chemotherapy. Complete your remaining fractions of radiation as scheduled. Follow-up in approximate 4-5 weeks with a restaging CT scan of your chest to reevaluate your disease

## 2015-06-30 ENCOUNTER — Ambulatory Visit
Admission: RE | Admit: 2015-06-30 | Discharge: 2015-06-30 | Disposition: A | Discharge status: Home / Self Care | Payer: Medicare HMO | Source: Ambulatory Visit | Attending: Radiation Oncology | Admitting: Radiation Oncology

## 2015-06-30 DIAGNOSIS — Z510 Encounter for antineoplastic radiation therapy: Secondary | ICD-10-CM | POA: Diagnosis not present

## 2015-07-01 ENCOUNTER — Ambulatory Visit
Admission: RE | Admit: 2015-07-01 | Discharge: 2015-07-01 | Disposition: A | Discharge status: Home / Self Care | Payer: Medicare HMO | Source: Ambulatory Visit | Attending: Radiation Oncology | Admitting: Radiation Oncology

## 2015-07-01 DIAGNOSIS — Z510 Encounter for antineoplastic radiation therapy: Secondary | ICD-10-CM | POA: Diagnosis not present

## 2015-07-02 ENCOUNTER — Ambulatory Visit: Payer: Medicare HMO

## 2015-07-03 ENCOUNTER — Ambulatory Visit
Admission: RE | Admit: 2015-07-03 | Discharge: 2015-07-03 | Disposition: A | Discharge status: Home / Self Care | Payer: Medicare HMO | Source: Ambulatory Visit | Attending: Radiation Oncology | Admitting: Radiation Oncology

## 2015-07-03 ENCOUNTER — Encounter: Payer: Self-pay | Admitting: Radiation Oncology

## 2015-07-03 VITALS — BP 111/53 | HR 91 | Temp 97.5°F | Resp 16 | Wt 180.3 lb

## 2015-07-03 DIAGNOSIS — C3411 Malignant neoplasm of upper lobe, right bronchus or lung: Secondary | ICD-10-CM

## 2015-07-03 DIAGNOSIS — Z510 Encounter for antineoplastic radiation therapy: Secondary | ICD-10-CM | POA: Diagnosis not present

## 2015-07-03 NOTE — Progress Notes (Signed)
Vitals stable. Three pound weight loss noted. Reports fatigue and weakness. Daughter reports her mother "just wants to sleep." Denies cough. Reports SOB with little exertion. Reports painful and difficult swallowing continue even with Carafate. No skin changes noted within treatment field. Advised to continue radiaplex bid.  One month follow up appointment card given.         BP 111/53 mmHg  Pulse 91  Temp(Src) 97.5 F (36.4 C) (Oral)  Resp 16  Wt 180 lb 4.8 oz (81.784 kg)  SpO2 99% Wt Readings from Last 3 Encounters:  07/03/15 180 lb 4.8 oz (81.784 kg)  06/29/15 183 lb (83.008 kg)  06/29/15 183 lb 12.8 oz (83.371 kg)

## 2015-07-03 NOTE — Progress Notes (Signed)
Department of Radiation Oncology  Phone:  [phone] Fax:        [phone]  Weekly Treatment Note    Name: Pamela Mcguire Date: 07/03/2015 MRN: [id number] DOB: [birth year]   Current dose: 66 Gy  Current fraction:33   MEDICATIONS: Current Outpatient Prescriptions  Medication Sig Dispense Refill  . acetaminophen (TYLENOL) 500 MG tablet Take 500-1,000 mg by mouth every 6 (six) hours as needed for mild pain or headache.    . albuterol (PROVENTIL HFA;VENTOLIN HFA) 108 (90 BASE) MCG/ACT inhaler Inhale 1 puff into the lungs every 6 (six) hours as needed for wheezing or shortness of breath.    . Cholecalciferol (VITAMIN D PO) Take 1 tablet by mouth every morning.     . Cyanocobalamin (VITAMIN B-12 PO) Take 1 capsule by mouth every morning.    Marland Kitchen ELIQUIS 5 MG TABS tablet TAKE 1 TABLET BY MOUTH TWICE DAILY 60 tablet 5  . emollient (BIAFINE) cream Apply topically as needed. (Patient taking differently: Apply 1 application topically daily as needed. ) 454 g 0  . escitalopram (LEXAPRO) 10 MG tablet Take 1 tablet (10 mg total) by mouth daily. 30 tablet 5  . ezetimibe-simvastatin (VYTORIN) 10-40 MG per tablet Take 1 tablet by mouth daily. 90 tablet 3  . feeding supplement (BOOST / RESOURCE BREEZE) LIQD Take 1 Container by mouth 3 (three) times daily between meals. 90 Container 0  . HYDROcodone-acetaminophen (NORCO/VICODIN) 5-325 MG per tablet Take 1 tablet by mouth every 6 (six) hours as needed for moderate pain. 30 tablet 0  . loratadine (CLARITIN) 10 MG tablet Take 10 mg by mouth at bedtime as needed for allergies.    Marland Kitchen LORazepam (ATIVAN) 1 MG tablet Take 1 tablet (1 mg total) by mouth at bedtime. 45 tablet 0  . magnesium oxide (MAG-OX) 400 (241.3 MG) MG tablet Take 1 tablet (400 mg total) by mouth 2 (two) times daily. 60 tablet 0  . metoprolol tartrate (LOPRESSOR) 25 MG tablet Take 2 tablets (50 mg total) by mouth 2 (two) times daily. 180 tablet 2  . mometasone-formoterol (DULERA) 100-5 MCG/ACT  AERO Inhale 2 puffs into the lungs 2 (two) times daily as needed for wheezing.     . Omega-3 Fatty Acids (FISH OIL) 1200 MG CAPS Take 2 capsules by mouth daily.    . pantoprazole (PROTONIX) 40 MG tablet Take 40 mg by mouth daily.    . potassium chloride 20 MEQ TBCR Take 10 mEq by mouth daily. 30 tablet 0  . Probiotic Product (PROBIOTIC PO) Take 1 capsule by mouth 2 (two) times daily.    . prochlorperazine (COMPAZINE) 10 MG tablet Take 1 tablet (10 mg total) by mouth every 6 (six) hours as needed for nausea or vomiting. 30 tablet 0  . sucralfate (CARAFATE) 1 G tablet Take 1 tablet (1 g total) by mouth 4 (four) times daily -  with meals and at bedtime. 5 min before meals for radiation induced esophagitis 120 tablet 2  . Wound Cleansers (RADIAPLEX EX) Apply topically.     No current facility-administered medications for this encounter.     ALLERGIES: Review of patient's allergies indicates no known allergies.   LABORATORY DATA:  Lab Results  Component Value Date   WBC 2.5* 06/29/2015   HGB 10.3* 06/29/2015   HCT 31.0* 06/29/2015   MCV 86.8 06/29/2015   PLT 280 06/29/2015   Lab Results  Component Value Date   NA 138 06/29/2015   K 3.5 06/29/2015   CL 110 06/26/2015  CO2 25 06/29/2015   Lab Results  Component Value Date   ALT 10 06/29/2015   AST 11 06/29/2015   ALKPHOS 41 06/29/2015   BILITOT 0.46 06/29/2015     NARRATIVE: Pamela Mcguire was seen today for weekly treatment management. The chart was checked and the patient's films were reviewed. Vitals stable. Three pound weight loss noted. Reports fatigue and weakness. Daughter reports her mother "just wants to sleep." Denies cough. Reports SOB with little exertion. Reports painful and difficult swallowing continue even with Carafate. No skin changes noted within treatment field. Advised to continue radiaplex bid.  One month follow up appointment card given.   Patient notes blurred vision since 2 weeks ago.    PHYSICAL  EXAMINATION: weight is 180 lb 4.8 oz (81.784 kg). Her oral temperature is 97.5 F (36.4 C). Her blood pressure is 111/53 and her pulse is 91. Her respiration is 16 and oxygen saturation is 99%.        ASSESSMENT: The patient did satisfactorily with treatment. Significant fatigue and continued esophagitis, but overall doing fairly well at the end of XRT.  PLAN: The patient will follow-up in our clinic in 1 month.  ------------------------------------------------  Jodelle Gross, MD, PhD  This document serves as a record of services personally performed by Kyung Rudd, MD. It was created on his behalf by Derek Mound, a trained medical scribe. The creation of this record is based on the scribe's personal observations and the provider's statements to them. This document has been checked and approved by the attending provider.

## 2015-07-08 ENCOUNTER — Telehealth: Payer: Self-pay | Admitting: *Deleted

## 2015-07-08 NOTE — Progress Notes (Signed)
  Radiation Oncology         [phone] ________________________________  Name: Pamela Mcguire MRN: [id number]  Date: 07/03/2015  DOB: Jul 29, 1935  End of Treatment Note   ICD-9-CM ICD-10-CM    1. Adenocarcinoma of upper lobe of right lung 162.3 C34.11     DIAGNOSIS: 80 yo woman with encounter diagnosis of Cancer of upper lobe of right lung.     Indication for treatment:  Curative Chemo-Radiotherapy       Radiation treatment dates:   05/18/2015-07/03/2015  Site/dose:   The right upper lung primary and mediastinal adenopathy were treated to 66 Gy in 33 fractions of 2 Gy  Beams/energy:   A 5-field 3D conformal technique was used with 6 MV X-rays and daily CT guidance.  Narrative: The patient tolerated radiation treatment relatively well.  However, she was hospitalized with suspected sepsis.  She had profound fatigue during radiotherapy, and some painful swallowing despite carafate.  Plan: The patient has completed radiation treatment. The patient will return to radiation oncology clinic for routine followup in one month. I advised her to call or return sooner if she has any questions or concerns related to her recovery or treatment. ________________________________  Sheral Apley. Tammi Klippel, M.D.

## 2015-07-08 NOTE — Telephone Encounter (Signed)
Faxed orders r/t home health certification and plan of care, plan of treatment to [facility]

## 2015-07-09 ENCOUNTER — Other Ambulatory Visit: Payer: Medicare HMO

## 2015-07-09 ENCOUNTER — Ambulatory Visit: Payer: Medicare HMO | Admitting: Physician Assistant

## 2015-08-04 ENCOUNTER — Other Ambulatory Visit (HOSPITAL_BASED_OUTPATIENT_CLINIC_OR_DEPARTMENT_OTHER): Payer: Medicare HMO

## 2015-08-04 ENCOUNTER — Ambulatory Visit (HOSPITAL_COMMUNITY)
Admission: RE | Admit: 2015-08-04 | Discharge: 2015-08-04 | Disposition: A | Discharge status: Home / Self Care | Payer: Medicare HMO | Source: Ambulatory Visit | Attending: Physician Assistant | Admitting: Physician Assistant

## 2015-08-04 ENCOUNTER — Encounter (HOSPITAL_COMMUNITY): Payer: Self-pay

## 2015-08-04 DIAGNOSIS — C3491 Malignant neoplasm of unspecified part of right bronchus or lung: Secondary | ICD-10-CM

## 2015-08-04 DIAGNOSIS — C3411 Malignant neoplasm of upper lobe, right bronchus or lung: Secondary | ICD-10-CM | POA: Diagnosis not present

## 2015-08-04 LAB — COMPREHENSIVE METABOLIC PANEL (CC13)
ALK PHOS: 60 U/L (ref 40–150)
ALT: 13 U/L (ref 0–55)
ANION GAP: 9 meq/L (ref 3–11)
AST: 14 U/L (ref 5–34)
Albumin: 3 g/dL — ABNORMAL LOW (ref 3.5–5.0)
BUN: 9.8 mg/dL (ref 7.0–26.0)
CALCIUM: 9.7 mg/dL (ref 8.4–10.4)
CO2: 24 mEq/L (ref 22–29)
Chloride: 104 mEq/L (ref 98–109)
Creatinine: 1 mg/dL (ref 0.6–1.1)
EGFR: 56 mL/min/{1.73_m2} — AB (ref >90)
Glucose: 147 mg/dl — ABNORMAL HIGH (ref 70–140)
Potassium: 4.2 mEq/L (ref 3.5–5.1)
Sodium: 137 mEq/L (ref 136–145)
Total Bilirubin: 0.49 mg/dL (ref 0.20–1.20)
Total Protein: 6.8 g/dL (ref 6.4–8.3)

## 2015-08-04 LAB — CBC WITH DIFFERENTIAL/PLATELET
BASO%: 0.5 % (ref 0.0–2.0)
BASOS ABS: 0 10*3/uL (ref 0.0–0.1)
EOS ABS: 0.2 10*3/uL (ref 0.0–0.5)
EOS%: 2.5 % (ref 0.0–7.0)
HEMATOCRIT: 32.6 % — AB (ref 34.8–46.6)
HEMOGLOBIN: 10.8 g/dL — AB (ref 11.6–15.9)
LYMPH#: 1 10*3/uL (ref 0.9–3.3)
LYMPH%: 11.2 % — ABNORMAL LOW (ref 14.0–49.7)
MCH: 29.5 pg (ref 25.1–34.0)
MCHC: 33.2 g/dL (ref 31.5–36.0)
MCV: 88.8 fL (ref 79.5–101.0)
MONO#: 0.8 10*3/uL (ref 0.1–0.9)
MONO%: 9 % (ref 0.0–14.0)
NEUT#: 6.5 10*3/uL (ref 1.5–6.5)
NEUT%: 76.8 % (ref 38.4–76.8)
Platelets: 353 10*3/uL (ref 145–400)
RBC: 3.67 10*6/uL — ABNORMAL LOW (ref 3.70–5.45)
RDW: 17.3 % — ABNORMAL HIGH (ref 11.2–14.5)
WBC: 8.5 10*3/uL (ref 3.9–10.3)

## 2015-08-04 MED ORDER — IOHEXOL 300 MG/ML  SOLN
75.0000 mL | Freq: Once | INTRAMUSCULAR | Status: AC | PRN
  Administered 2015-08-04: 75 mL via INTRAVENOUS

## 2015-08-05 ENCOUNTER — Telehealth: Payer: Self-pay | Admitting: Internal Medicine

## 2015-08-05 ENCOUNTER — Ambulatory Visit (HOSPITAL_BASED_OUTPATIENT_CLINIC_OR_DEPARTMENT_OTHER): Payer: Medicare HMO | Admitting: Internal Medicine

## 2015-08-05 ENCOUNTER — Encounter: Payer: Self-pay | Admitting: Internal Medicine

## 2015-08-05 VITALS — BP 117/73 | HR 116 | Temp 98.6°F | Resp 18 | Ht 66.0 in | Wt 178.2 lb

## 2015-08-05 DIAGNOSIS — C771 Secondary and unspecified malignant neoplasm of intrathoracic lymph nodes: Secondary | ICD-10-CM

## 2015-08-05 DIAGNOSIS — C3491 Malignant neoplasm of unspecified part of right bronchus or lung: Secondary | ICD-10-CM

## 2015-08-05 DIAGNOSIS — C3411 Malignant neoplasm of upper lobe, right bronchus or lung: Secondary | ICD-10-CM

## 2015-08-05 NOTE — Telephone Encounter (Signed)
Appointments made and avs printed for patient °

## 2015-08-05 NOTE — Progress Notes (Signed)
[facility] [phone]   [phone]  OFFICE PROGRESS NOTE  [name], [address] [place] [address]  DIAGNOSIS: Non-small cell carcinoma of lung, stage 3  Staging form: Lung, AJCC 7th Edition  Clinical stage from 04/30/2015: Stage IIIA (T1a, N2, M0) - Signed by Curt Bears, MD on 04/30/2015  PRIOR THERAPY: Concurrent chemoradiation with chemotherapy in the form of weekly carboplatin for an AUC of 2 and paclitaxel at 45 mg/m given concurrent with radiation. Last dose was given 06/18/2015 with partial response.  CURRENT THERAPY: Observation.  INTERVAL HISTORY: Pamela Mcguire 80 y.o. female returns to the clinic today for follow-up visit accompanied by her daughter. The patient continues to complain of increasing fatigue and weakness. She fell recently and hurt her right leg and knee. She continues to have generalized weakness. She denied having any significant chest pain but has shortness breath with exertion with no significant cough or hemoptysis. She has no nausea or vomiting, no fever or chills. She tolerated her previous course of concurrent chemoradiation fairly well except for radiation induced esophagitis. The patient had repeat CT scan of the chest performed recently and she is here for evaluation and discussion of her scan results.  MEDICAL HISTORY: Past Medical History  Diagnosis Date  . Hyperlipidemia   . Diverticulitis   . CAD (coronary artery disease)   . AF (atrial fibrillation)   . Hypertension   . Seasonal allergies   . Prediabetes   . Depression   . Shortness of breath dyspnea   . Anxiety   . Urinary frequency   . GERD (gastroesophageal reflux disease)   . Breast cancer     right mastectomy  . Sleep apnea      dx'd 10/2014, has not received cpap yet (04/28/2015)  . Arthritis     "thighs, hips, arms" (04/28/2015)  . Non-small cell carcinoma of lung, stage 3 04/30/2015     ALLERGIES:  has No Known Allergies.  MEDICATIONS:  Current Outpatient Prescriptions  Medication Sig Dispense Refill  . albuterol (PROVENTIL HFA;VENTOLIN HFA) 108 (90 BASE) MCG/ACT inhaler Inhale 1 puff into the lungs every 6 (six) hours as needed for wheezing or shortness of breath.    . Cholecalciferol (VITAMIN D PO) Take 1 tablet by mouth every morning.     . Cyanocobalamin (VITAMIN B-12 PO) Take 1 capsule by mouth every morning.    Marland Kitchen ELIQUIS 5 MG TABS tablet TAKE 1 TABLET BY MOUTH TWICE DAILY 60 tablet 5  . emollient (BIAFINE) cream Apply topically as needed. (Patient taking differently: Apply 1 application topically daily as needed. ) 454 g 0  . escitalopram (LEXAPRO) 10 MG tablet Take 1 tablet (10 mg total) by mouth daily. 30 tablet 5  . ezetimibe-simvastatin (VYTORIN) 10-40 MG per tablet Take 1 tablet by mouth daily. 90 tablet 3  . feeding supplement (BOOST / RESOURCE BREEZE) LIQD Take 1 Container by mouth 3 (three) times daily between meals. 90 Container 0  . folic acid (FOLVITE) 1 MG tablet TAKE 1 TABLET BY MOUTH TWICE DAILY    . HYDROcodone-acetaminophen (NORCO/VICODIN) 5-325 MG per tablet Take 1 tablet by mouth every 6 (six) hours as needed for moderate pain. 30 tablet 0  . LORazepam (ATIVAN) 1 MG tablet Take 1 tablet (1 mg total) by mouth at bedtime. 45 tablet 0  . Magnesium Chloride 535 (64 MG) MG TBCR Take 512 mg by mouth.    . magnesium  oxide (MAG-OX) 400 (241.3 MG) MG tablet Take 1 tablet (400 mg total) by mouth 2 (two) times daily. 60 tablet 0  . metoprolol tartrate (LOPRESSOR) 25 MG tablet Take 2 tablets (50 mg total) by mouth 2 (two) times daily. 180 tablet 2  . mometasone-formoterol (DULERA) 100-5 MCG/ACT AERO Inhale 2 puffs into the lungs 2 (two) times daily as needed for wheezing.     . pantoprazole (PROTONIX) 40 MG tablet Take 40 mg by mouth daily.    . Probiotic Product (PROBIOTIC PO) Take 1 capsule by mouth 2 (two) times daily.    . sucralfate (CARAFATE) 1 G tablet  Take 1 tablet (1 g total) by mouth 4 (four) times daily -  with meals and at bedtime. 5 min before meals for radiation induced esophagitis 120 tablet 2  . traZODone (DESYREL) 50 MG tablet TAKE 1 TABLET BY MOUTH NIGHTLY    . Wound Cleansers (RADIAPLEX EX) Apply topically.    Marland Kitchen acetaminophen (TYLENOL) 500 MG tablet Take 500-1,000 mg by mouth every 6 (six) hours as needed for mild pain or headache.    . loratadine (CLARITIN) 10 MG tablet Take 10 mg by mouth at bedtime as needed for allergies.    . potassium chloride 20 MEQ TBCR Take 10 mEq by mouth daily. (Patient not taking: Reported on 08/05/2015) 30 tablet 0  . prochlorperazine (COMPAZINE) 10 MG tablet Take 1 tablet (10 mg total) by mouth every 6 (six) hours as needed for nausea or vomiting. (Patient not taking: Reported on 08/05/2015) 30 tablet 0   No current facility-administered medications for this visit.    SURGICAL HISTORY:  Past Surgical History  Procedure Laterality Date  . Cardioversion    . Tonsillectomy    . Colonoscopy    . Cesarean section  X 2  . Mastectomy Right   . Breast surgery    . Mediastinoscopy N/A 04/28/2015    Procedure: MEDIASTINOSCOPY;  Surgeon: [name], MD;  Location: [facility] OR;  Service: Thoracic;  Laterality: N/A;    REVIEW OF SYSTEMS:  Constitutional: positive for anorexia and fatigue Eyes: negative Ears, nose, mouth, throat, and face: negative Respiratory: positive for cough and dyspnea on exertion Cardiovascular: negative Gastrointestinal: negative Genitourinary:negative Integument/breast: negative Hematologic/lymphatic: negative Musculoskeletal:positive for muscle weakness Neurological: negative Behavioral/Psych: negative Endocrine: negative Allergic/Immunologic: negative   PHYSICAL EXAMINATION: General appearance: alert, cooperative, fatigued and no distress Head: Normocephalic, without obvious abnormality, atraumatic Neck: no adenopathy, no JVD, supple, symmetrical, trachea midline and thyroid  not enlarged, symmetric, no tenderness/mass/nodules Lymph nodes: Cervical, supraclavicular, and axillary nodes normal. Resp: clear to auscultation bilaterally Back: symmetric, no curvature. ROM normal. No CVA tenderness. Cardio: regular rate and rhythm, S1, S2 normal, no murmur, click, rub or gallop GI: soft, non-tender; bowel sounds normal; no masses,  no organomegaly Extremities: extremities normal, atraumatic, no cyanosis or edema Neurologic: Alert and oriented X 3, normal strength and tone. Normal symmetric reflexes. Normal coordination and gait  ECOG PERFORMANCE STATUS: 2 - Symptomatic, <50% confined to bed  Blood pressure 117/73, pulse 116, temperature 98.6 F (37 C), temperature source Oral, resp. rate 18, height '5\' 6"'$  (1.676 m), weight 178 lb 3.2 oz (80.831 kg), SpO2 95 %.  LABORATORY DATA: Lab Results  Component Value Date   WBC 8.5 08/04/2015   HGB 10.8* 08/04/2015   HCT 32.6* 08/04/2015   MCV 88.8 08/04/2015   PLT 353 08/04/2015      Chemistry      Component Value Date/Time   NA 137 08/04/2015  1018   NA 138 06/26/2015 0820   K 4.2 08/04/2015 1018   K 3.4* 06/26/2015 0820   CL 110 06/26/2015 0820   CO2 24 08/04/2015 1018   CO2 21* 06/26/2015 0820   BUN 9.8 08/04/2015 1018   BUN 5* 06/26/2015 0820   CREATININE 1.0 08/04/2015 1018   CREATININE 1.27* 06/26/2015 0820   CREATININE 0.91 06/11/2014 1608      Component Value Date/Time   CALCIUM 9.7 08/04/2015 1018   CALCIUM 8.2* 06/26/2015 0820   ALKPHOS 60 08/04/2015 1018   ALKPHOS 30* 06/23/2015 0516   AST 14 08/04/2015 1018   AST 16 06/23/2015 0516   ALT 13 08/04/2015 1018   ALT 15 06/23/2015 0516   BILITOT 0.49 08/04/2015 1018   BILITOT 0.7 06/23/2015 0516       RADIOGRAPHIC STUDIES: Ct Chest W Contrast  08/04/2015   CLINICAL DATA:  Lung cancer  EXAM: CT CHEST WITH CONTRAST  TECHNIQUE: Multidetector CT imaging of the chest was performed during intravenous contrast administration.  CONTRAST:  71m  OMNIPAQUE IOHEXOL 300 MG/ML  SOLN  COMPARISON:  PET-CT from 04/10/2015  FINDINGS: Mediastinum: The heart size appears normal. No pericardial effusion. Aortic atherosclerosis noted. Calcifications within the RCA, LAD and left circumflex coronary arteries noted. Right paratracheal adenopathy measures 2 cm, image 22/ series 2. Previously 3.1 cm. Sub- carinal lymph node measures 9 mm previously 1.3 cm.  Lungs/Pleura: No pleural effusion identified. Lesion within the anterior right upper lobe measures 1.3 cm, image 16/ series 4. Previously 1.8 cm. No new or enlarging pulmonary nodules or masses.Small index nodule within the base of the right middle lobe is stable measuring 4 mm, image 47/series 4.  Upper Abdomen: The visualized portions of the liver are normal. Stable low density structure within the spleen measures 6 mm. There is a nodule in the left adrenal gland measuring 11 mm. Unchanged from previous study.  Musculoskeletal: No aggressive lytic or sclerotic bone lesion.  IMPRESSION: 1. Interval response to therapy. The index lesion in the anterior right upper lobe has decreased in size from previous exam. There has also been decrease in size of the right paratracheal and sub- carinal adenopathy.   Electronically Signed   By: TKerby MoorsM.D.   On: 08/04/2015 14:56    ASSESSMENT AND PLAN: This is a very pleasant 80years old white female with history of stage IIIA non-small cell lung cancer status post a course of concurrent chemoradiation with weekly carboplatin and paclitaxel with partial response. The patient has rough time tolerating her previous treatment with increasing fatigue and weakness as well as radiation induced esophagitis. The recent CT scan of the chest showed interval response to therapy with decrease in the size of the anterior right upper lobe lesion in addition to the mediastinal lymphadenopathy. I discussed the scan results with the patient and her daughter. I do think the patient would  be a good candidate for consolidation chemotherapy because of her general condition and comorbidities. I recommended for her to continue on observation for now with repeat CT scan of the chest in 3 months. The patient and her daughter agreed to the current plan.  She was advised to call immediately if she has any concerning symptoms in the interval. The patient voices understanding of current disease status and treatment options and is in agreement with the current care plan.  All questions were answered. The patient knows to call the clinic with any problems, questions or concerns. We can certainly see  the patient much sooner if necessary.  I spent 15 minutes counseling the patient face to face. The total time spent in the appointment was 25 minutes.  Disclaimer: This note was dictated with voice recognition software. Similar sounding words can inadvertently be transcribed and may not be corrected upon review.

## 2015-08-10 ENCOUNTER — Telehealth: Payer: Self-pay | Admitting: Internal Medicine

## 2015-08-10 ENCOUNTER — Encounter: Payer: Self-pay | Admitting: Internal Medicine

## 2015-08-11 NOTE — Telephone Encounter (Signed)
Close encounter 

## 2015-08-13 ENCOUNTER — Encounter: Payer: Self-pay | Admitting: Radiation Oncology

## 2015-08-13 ENCOUNTER — Ambulatory Visit
Admission: RE | Admit: 2015-08-13 | Discharge: 2015-08-13 | Disposition: A | Discharge status: Home / Self Care | Payer: Medicare HMO | Source: Ambulatory Visit | Attending: Radiation Oncology | Admitting: Radiation Oncology

## 2015-08-13 VITALS — BP 100/63 | HR 94 | Temp 97.4°F | Resp 20 | Wt 178.0 lb

## 2015-08-13 DIAGNOSIS — C3411 Malignant neoplasm of upper lobe, right bronchus or lung: Secondary | ICD-10-CM

## 2015-08-13 NOTE — Progress Notes (Signed)
Weight and vitals stable. Report she is very weak. Reports on average 4 formed bowel movements per day. [place] two weeks ago in her bedroom and sustained a fx of her right knee. Reports sore throat. Daughter reports the patient frequently belches. Reports chemotherapy is on hold for now and they will re evaluate in three months. Reports SOB. Reports headache, dizziness, and blurry vision. Denies nausea or vomiting. Denies ringing in her ears. Report taking neck pain for Motrin. Scheduled to follow up with cardiologist next month. Reports a runny nose.  BP 100/63 mmHg  Pulse 94  Temp(Src) 97.4 F (36.3 C) (Oral)  Resp 20  Wt 178 lb (80.74 kg)  SpO2 97% Wt Readings from Last 3 Encounters:  08/13/15 178 lb (80.74 kg)  08/05/15 178 lb 3.2 oz (80.831 kg)  07/03/15 180 lb 4.8 oz (81.784 kg)

## 2015-08-13 NOTE — Progress Notes (Signed)
Radiation Oncology         [phone] ________________________________  Name: Pamela Mcguire  MRN: [id number]  Date: 08/13/2015  DOB: [birth year]  Follow-Up Visit Note  CC: [name], MD  Curt Bears, MD  Diagnosis: Pamela Mcguire is a 80 year old woman with adenocarcinoma of upper lobe of right lung.    ICD-9-CM ICD-10-CM   1. Adenocarcinoma of upper lobe of right lung 162.3 C34.11     Interval Since Last Radiation:  1  months  [05/18/2015-07/03/2015]  Narrative:  The patient returns today for routine follow-up appointment with radiation oncology. The patient's weight and vitals are currently stable. She reports she is very weak. She confirms on average 4 formed bowel movements per day, reports symptoms of a sore throat, belches, SOB, headache, dizziness, and blurry vision. She denies symptoms of nausea, vomiting, or ringing in her ears. The patient confirmed that when she stands, she gets light headed, but that she does not stand for long periods of time (only to visit the bathroom and the kitchen). She fell around two weeks ago in her bedroom and sustained a fracture of her right knee. A cast was present on her left leg. The patient's ambulatory status was wheelchair bound. "I sleep a lot," the patient was able to state, through labored breathing and her body shivering. Labored breathing lasted through out the appointment. "I feel so lousy," the patient was able to state through labored breathing. She only receives supplemental oxygen at home. The patient projected anxiety and was accompanied by her daughter for today's radiation oncology appointment. A CT scan was completed on 08/04/2015. Her chemotherapy treatment is on hold for now. She will be re-evaluate in three months. "He felt that she was too weak to have anything," the daughter reported. The patient administers Motrin to alleviate her neck pain.                             ALLERGIES:  has No Known Allergies.  Meds: Current  Outpatient Prescriptions  Medication Sig Dispense Refill  . acetaminophen (TYLENOL) 500 MG tablet Take 500-1,000 mg by mouth every 6 (six) hours as needed for mild pain or headache.    . albuterol (PROVENTIL HFA;VENTOLIN HFA) 108 (90 BASE) MCG/ACT inhaler Inhale 1 puff into the lungs every 6 (six) hours as needed for wheezing or shortness of breath.    . Cholecalciferol (VITAMIN D PO) Take 1 tablet by mouth every morning.     . Cyanocobalamin (VITAMIN B-12 PO) Take 1 capsule by mouth every morning.    Marland Kitchen ELIQUIS 5 MG TABS tablet TAKE 1 TABLET BY MOUTH TWICE DAILY 60 tablet 5  . escitalopram (LEXAPRO) 10 MG tablet Take 1 tablet (10 mg total) by mouth daily. 30 tablet 5  . ezetimibe-simvastatin (VYTORIN) 10-40 MG per tablet Take 1 tablet by mouth daily. 90 tablet 3  . feeding supplement (BOOST / RESOURCE BREEZE) LIQD Take 1 Container by mouth 3 (three) times daily between meals. 90 Container 0  . folic acid (FOLVITE) 1 MG tablet TAKE 1 TABLET BY MOUTH TWICE DAILY    . HYDROcodone-acetaminophen (NORCO/VICODIN) 5-325 MG per tablet Take 1 tablet by mouth every 6 (six) hours as needed for moderate pain. 30 tablet 0  . loratadine (CLARITIN) 10 MG tablet Take 10 mg by mouth at bedtime as needed for allergies.    Marland Kitchen LORazepam (ATIVAN) 1 MG tablet Take 1 tablet (1 mg total) by mouth  at bedtime. 45 tablet 0  . Magnesium Chloride 535 (64 MG) MG TBCR Take 512 mg by mouth.    . magnesium oxide (MAG-OX) 400 (241.3 MG) MG tablet Take 1 tablet (400 mg total) by mouth 2 (two) times daily. 60 tablet 0  . metoprolol tartrate (LOPRESSOR) 25 MG tablet Take 2 tablets (50 mg total) by mouth 2 (two) times daily. 180 tablet 2  . mometasone-formoterol (DULERA) 100-5 MCG/ACT AERO Inhale 2 puffs into the lungs 2 (two) times daily as needed for wheezing.     . pantoprazole (PROTONIX) 40 MG tablet Take 40 mg by mouth daily.    . potassium chloride 20 MEQ TBCR Take 10 mEq by mouth daily. 30 tablet 0  . Probiotic Product  (PROBIOTIC PO) Take 1 capsule by mouth 2 (two) times daily.    . prochlorperazine (COMPAZINE) 10 MG tablet Take 1 tablet (10 mg total) by mouth every 6 (six) hours as needed for nausea or vomiting. 30 tablet 0  . sucralfate (CARAFATE) 1 G tablet Take 1 tablet (1 g total) by mouth 4 (four) times daily -  with meals and at bedtime. 5 min before meals for radiation induced esophagitis 120 tablet 2  . traZODone (DESYREL) 50 MG tablet TAKE 1 TABLET BY MOUTH NIGHTLY    . Wound Cleansers (RADIAPLEX EX) Apply topically.    Marland Kitchen emollient (BIAFINE) cream Apply topically as needed. (Patient not taking: Reported on 08/13/2015) 454 g 0   No current facility-administered medications for this encounter.    Physical Findings: The patient is in no acute distress. Patient is alert and oriented.  weight is 178 lb (80.74 kg). Her oral temperature is 97.4 F (36.3 C). Her blood pressure is 100/63 and her pulse is 94. Her respiration is 20 and oxygen saturation is 97%. .  No significant changes.  Lab Findings: Lab Results  Component Value Date   WBC 8.5 08/04/2015   WBC 3.0* 06/26/2015   HGB 10.8* 08/04/2015   HGB 9.8* 06/26/2015   HCT 32.6* 08/04/2015   HCT 28.3* 06/26/2015   PLT 353 08/04/2015   PLT 227 06/26/2015    Lab Results  Component Value Date   NA 137 08/04/2015   NA 138 06/26/2015   K 4.2 08/04/2015   K 3.4* 06/26/2015   CHLORIDE 104 08/04/2015   CO2 24 08/04/2015   CO2 21* 06/26/2015   GLUCOSE 147* 08/04/2015   GLUCOSE 125* 06/26/2015   BUN 9.8 08/04/2015   BUN 5* 06/26/2015   CREATININE 1.0 08/04/2015   CREATININE 1.27* 06/26/2015   CREATININE 0.91 06/11/2014   BILITOT 0.49 08/04/2015   BILITOT 0.7 06/23/2015   ALKPHOS 60 08/04/2015   ALKPHOS 30* 06/23/2015   AST 14 08/04/2015   AST 16 06/23/2015   ALT 13 08/04/2015   ALT 15 06/23/2015   PROT 6.8 08/04/2015   PROT 5.8* 06/23/2015   ALBUMIN 3.0* 08/04/2015   ALBUMIN 2.9* 06/23/2015   CALCIUM 9.7 08/04/2015   CALCIUM 8.2*  06/26/2015   ANIONGAP 9 08/04/2015   ANIONGAP 7 06/26/2015    Radiographic Findings: Ct Chest W Contrast  08/04/2015   CLINICAL DATA:  Lung cancer  EXAM: CT CHEST WITH CONTRAST  TECHNIQUE: Multidetector CT imaging of the chest was performed during intravenous contrast administration.  CONTRAST:  97m OMNIPAQUE IOHEXOL 300 MG/ML  SOLN  COMPARISON:  PET-CT from 04/10/2015  FINDINGS: Mediastinum: The heart size appears normal. No pericardial effusion. Aortic atherosclerosis noted. Calcifications within the RCA, LAD and left circumflex  coronary arteries noted. Right paratracheal adenopathy measures 2 cm, image 22/ series 2. Previously 3.1 cm. Sub- carinal lymph node measures 9 mm previously 1.3 cm.  Lungs/Pleura: No pleural effusion identified. Lesion within the anterior right upper lobe measures 1.3 cm, image 16/ series 4. Previously 1.8 cm. No new or enlarging pulmonary nodules or masses.Small index nodule within the base of the right middle lobe is stable measuring 4 mm, image 47/series 4.  Upper Abdomen: The visualized portions of the liver are normal. Stable low density structure within the spleen measures 6 mm. There is a nodule in the left adrenal gland measuring 11 mm. Unchanged from previous study.  Musculoskeletal: No aggressive lytic or sclerotic bone lesion.  IMPRESSION: 1. Interval response to therapy. The index lesion in the anterior right upper lobe has decreased in size from previous exam. There has also been decrease in size of the right paratracheal and sub- carinal adenopathy.   Electronically Signed   By: Kerby Moors M.D.   On: 08/04/2015 14:56    Impression: The patient is recovering from the effects of radiation. The patient understands the purpose and results of her most recent CT scan. The patient understands the importance of maintaining healthy nutrition, due to her weight loss through out treatment. The patient was found to be potentially somewhat dehydrated at today's appointment,  based upon her most recent vitals and the completion of a skin turgor test. It is noted that she had poor skin turgor. The patient was educated on the importance of remaining hydrated and it's significant's in regards to her recovery. The patient and her daughter are aware that medications may be responsible for concerning symptoms presented today. She is aware that follow-up appointments are to take place with Dr. Inda Merlin of medical oncology, moving forward. The patient and her daughter are aware that they can view future appointments via [place] [facility].  Plan: The patient and the patient's daughter were both educated on the importance of dehydration prevention. Possible treatment options to alleviate her most concerning symptom include administration of IV fluids and intaking fluids daily. As a long term approach, she has been strongly encouraged to add chicken noodle soup and Gatorade to her daily diet. The patient was extremely short of breathe and fatigued while sitting and as a results was encouraged to undergo further evaluation before returning home. Upon further evaluation, she was encouraged to re-evaluate her current medications in regards to her current symptoms of low blood pressure, rapid resting heart rate, and labored breathing. The patient is aware of her appointment with her cardiologist next month. The patient is seeking a new PCP to evaluate her current medications. She is aware of her appointment with Dr. Drema Dallas to take place later today (08/13/2015). pulmonary rehabilitation might be recommended in the future, once her leg injury caused from her most recent fall has fully healed. All vocalized questions and concerns have been addressed. That patient and her daughter have been educated on the importance of maintaining a healthy blood pressure. She was instructed to evaluate her blood pressure regularly at home. She is aware of her follow-up appointment to take place with Dr. Inda Merlin of medical  oncology, moving forward. If the patient develops any further questions or concerns in regards to her treatment and recovery, she has been encouraged to contact Dr. Tammi Klippel, MD and schedule appointments on an as needed basis.  This document serves as a record of services personally performed by Tyler Pita, MD. It was created on his behalf by  Lenn Cal, a trained medical scribe. The creation of this record is based on the scribe's personal observations and the provider's statements to them. This document has been checked and approved by the attending provider.  _____________________________________  Sheral Apley. Tammi Klippel, M.D.

## 2015-08-18 ENCOUNTER — Encounter: Payer: Self-pay | Admitting: Family

## 2015-08-18 ENCOUNTER — Ambulatory Visit (INDEPENDENT_AMBULATORY_CARE_PROVIDER_SITE_OTHER): Payer: Medicare HMO | Admitting: Family

## 2015-08-18 ENCOUNTER — Other Ambulatory Visit (INDEPENDENT_AMBULATORY_CARE_PROVIDER_SITE_OTHER): Payer: Medicare HMO

## 2015-08-18 VITALS — BP 114/70 | HR 101 | Temp 97.2°F | Resp 18 | Ht 66.0 in | Wt 175.0 lb

## 2015-08-18 DIAGNOSIS — R5383 Other fatigue: Secondary | ICD-10-CM | POA: Diagnosis not present

## 2015-08-18 DIAGNOSIS — F32A Depression, unspecified: Secondary | ICD-10-CM

## 2015-08-18 DIAGNOSIS — I480 Paroxysmal atrial fibrillation: Secondary | ICD-10-CM

## 2015-08-18 DIAGNOSIS — K529 Noninfective gastroenteritis and colitis, unspecified: Secondary | ICD-10-CM | POA: Insufficient documentation

## 2015-08-18 DIAGNOSIS — F329 Major depressive disorder, single episode, unspecified: Secondary | ICD-10-CM

## 2015-08-18 DIAGNOSIS — Z23 Encounter for immunization: Secondary | ICD-10-CM | POA: Diagnosis not present

## 2015-08-18 DIAGNOSIS — G4733 Obstructive sleep apnea (adult) (pediatric): Secondary | ICD-10-CM

## 2015-08-18 LAB — TSH: TSH: 0.76 u[IU]/mL (ref 0.35–4.50)

## 2015-08-18 LAB — VITAMIN B12: Vitamin B-12: 1149 pg/mL — ABNORMAL HIGH (ref 211–911)

## 2015-08-18 MED ORDER — BUPROPION HCL ER (SR) 100 MG PO TB12: 100.0000 mg | ORAL_TABLET | Freq: Every day | ORAL | Status: DC

## 2015-08-18 NOTE — Assessment & Plan Note (Signed)
Symptoms of fatigue of undetermined origin and most likely multifactorial in nature including possibilities of depression, decreased nutrition, and lung cancer therapies. Obtain B12 and TSH to rule out other metabolic causes. Depression is currently maintained on Lexapro. There is also a mild normocytic anemia which may also be a contributing factor. Treat conservatively with nutrition and adequate rest. Continue previously prescribed medications. Follow up pending lab work.

## 2015-08-18 NOTE — Assessment & Plan Note (Signed)
Not currently using her CPAP which may also be contributing to her current fatigue. Encouraged CPAP usage.

## 2015-08-18 NOTE — Patient Instructions (Signed)
Thank you for choosing [facility].  Summary/Instructions:  Your prescription(s) have been submitted to your pharmacy or been printed and provided for you. Please take as directed and contact our office if you believe you are having problem(s) with the medication(s) or have any questions.  Please stop by the lab on the basement level of the building for your blood work. Your results will be released to [place] (or called to you) after review, usually within 72 hours after test completion. If any changes need to be made, you will be notified at that same time.  If your symptoms worsen or fail to improve, please contact our office for further instruction, or in case of emergency go directly to the emergency room at the closest medical facility.   Fatigue Fatigue is a feeling of tiredness, lack of energy, lack of motivation, or feeling tired all the time. Having enough rest, good nutrition, and reducing stress will normally reduce fatigue. Consult your caregiver if it persists. The nature of your fatigue will help your caregiver to find out its cause. The treatment is based on the cause.  CAUSES  There are many causes for fatigue. Most of the time, fatigue can be traced to one or more of your habits or routines. Most causes fit into one or more of three general areas. They are: Lifestyle problems  Sleep disturbances.  Overwork.  Physical exertion.  Unhealthy habits.  Poor eating habits or eating disorders.  Alcohol and/or drug use .  Lack of proper nutrition (malnutrition). Psychological problems  Stress and/or anxiety problems.  Depression.  Grief.  Boredom. Medical Problems or Conditions  Anemia.  Pregnancy.  Thyroid gland problems.  Recovery from major surgery.  Continuous pain.  Emphysema or asthma that is not well controlled  Allergic conditions.  Diabetes.  Infections (such as mononucleosis).  Obesity.  Sleep disorders, such as sleep  apnea.  Heart failure or other heart-related problems.  Cancer.  Kidney disease.  Liver disease.  Effects of certain medicines such as antihistamines, cough and cold remedies, prescription pain medicines, heart and blood pressure medicines, drugs used for treatment of cancer, and some antidepressants. SYMPTOMS  The symptoms of fatigue include:   Lack of energy.  Lack of drive (motivation).  Drowsiness.  Feeling of indifference to the surroundings. DIAGNOSIS  The details of how you feel help guide your caregiver in finding out what is causing the fatigue. You will be asked about your present and past health condition. It is important to review all medicines that you take, including prescription and non-prescription items. A thorough exam will be done. You will be questioned about your feelings, habits, and normal lifestyle. Your caregiver may suggest blood tests, urine tests, or other tests to look for common medical causes of fatigue.  TREATMENT  Fatigue is treated by correcting the underlying cause. For example, if you have continuous pain or depression, treating these causes will improve how you feel. Similarly, adjusting the dose of certain medicines will help in reducing fatigue.  HOME CARE INSTRUCTIONS   Try to get the required amount of good sleep every night.  Eat a healthy and nutritious diet, and drink enough water throughout the day.  Practice ways of relaxing (including yoga or meditation).  Exercise regularly.  Make plans to change situations that cause stress. Act on those plans so that stresses decrease over time. Keep your work and personal routine reasonable.  Avoid street drugs and minimize use of alcohol.  Start taking a daily multivitamin after  consulting your caregiver. SEEK MEDICAL CARE IF:   You have persistent tiredness, which cannot be accounted for.  You have fever.  You have unintentional weight loss.  You have headaches.  You have disturbed  sleep throughout the night.  You are feeling sad.  You have constipation.  You have dry skin.  You have gained weight.  You are taking any new or different medicines that you suspect are causing fatigue.  You are unable to sleep at night.  You develop any unusual swelling of your legs or other parts of your body. SEEK IMMEDIATE MEDICAL CARE IF:   You are feeling confused.  Your vision is blurred.  You feel faint or pass out.  You develop severe headache.  You develop severe abdominal, pelvic, or back pain.  You develop chest pain, shortness of breath, or an irregular or fast heartbeat.  You are unable to pass a normal amount of urine.  You develop abnormal bleeding such as bleeding from the rectum or you vomit blood.  You have thoughts about harming yourself or committing suicide.  You are worried that you might harm someone else. MAKE SURE YOU:   Understand these instructions.  Will watch your condition.  Will get help right away if you are not doing well or get worse. Document Released: 09/11/2007 Document Revised: 02/06/2012 Document Reviewed: 03/18/2014 [facility] Patient Information 2015 [place], [place]. This information is not intended to replace advice given to you by your health care provider. Make sure you discuss any questions you have with your health care provider.  Palliative Care Palliative care is the care of your body, mind, and spirit. Palliative care services are offered to people dealing with serious and life-threatening illnesses, often in the hospital or a long-term care setting. Palliative care requires a team of people who will help to ensure: Pain and symptoms are controlled. Family support. Spiritual support. Emotional and social support. Comfort. Palliative care is a way to bring comfort and peace of mind to a person and his or her family. It can also have a positive impact on the course of illness.  WHO CAN RECEIVE PALLIATIVE CARE  SERVICES? Palliative care is offered to children and adults when they are seriously ill and may not be responding well to treatment options. The person may be undergoing active treatment, such as chemotherapy, or may have stopped active treatment. Some people may have just been diagnosed with advanced disease or life-limiting illness. A health care provider will usually recommend palliative care services when more support would be helpful. WHAT TYPES OF SERVICES ARE INCLUDED IN PALLIATIVE CARE? Palliative care includes a team of health care providers and supporters who come together to help a person facing serious and life-threatening illness. The person's existing doctors are included in the care team, and supporters are added. Family members and friends may also receive palliative care services to cope with stress and other concerns. Services are different for each person and are based on the person's needs and preferences. The following people make up a palliative care team: The person receiving care and his or her family. Physicians, including primary health care providers and specialists. Nurses. A psychosocial worker. Other people on the palliative care team may include:  A pain specialist and sometimes a hospice specialist. A financial or Research officer, trade union. [employer], such as school and spiritual organizations. Religious leaders. A care coordinator or case manager. A bereavement coordinator. The team will talk with the person and his or her family about: The  role of the pain specialist and the hospice specialist if this applies. The person's active physical symptoms, such as pain, nausea, vomiting, and shortness of breath. Stress, depression, and anxiety symptoms. Function and mobility issues and how to stay as active as possible. Treatment options and how they may affect life. Spiritual wishes, such as rituals and prayer. Legacy and memory making activities. Life and  death as a normal process. Advance directives or living wills, health care proxies, and end-of-life care. Any other concerns or issues. The palliative care team will make it okay to talk about difficult issues and topics. Preferences and sensitive spiritual and emotional concerns are of high importance. PALLIATIVE CARE AND HOSPICE ARE SOMEWHAT DIFFERENT Palliative care and hospice care have similar goals of managing symptoms, promoting comfort, and maintaining dignity. However, hospice care is an option for people during the last 6 months of life expectancy. The palliative care team will coordinate the many support services provided during any phase of serious illness.  Document Released: 11/19/2013 Document Reviewed: 11/19/2013 [facility] Patient Information 2015 [place], [place]. This information is not intended to replace advice given to you by your health care provider. Make sure you discuss any questions you have with your health care provider.

## 2015-08-18 NOTE — Assessment & Plan Note (Signed)
Symptoms consistent with depression that is currently maintained with Lexapro and appears to be labile. Denies suicidal ideations. Continue current dosage of Lexapro pending blood work results for TSH. Consider increasing Lexapro or additional medications if symptoms are not related to hypothyroidism.

## 2015-08-18 NOTE — Assessment & Plan Note (Signed)
Currently anticoagulated with Elliquis and no evidence of nuisense bleeding. Cardiac exam revealed regular rate and rhythm.

## 2015-08-18 NOTE — Assessment & Plan Note (Signed)
Frequent stools most likely related to chemotherapy and radiation. Discussed continued dosage of probiotic and immodium as needed, however with caution to not result in constipation. Follow up if symptoms worsen or fail to improve.

## 2015-08-18 NOTE — Progress Notes (Signed)
Subjective:    Patient ID: Pamela Mcguire, female    DOB: 1935/04/23, 80 y.o.   MRN: [id number]  Chief Complaint  Patient presents with  . Establish Care    Wants to talk about increasing lexapro, she just wants to sleep all the time, no energy, loss of appetite, frequent stools    HPI:  Pamela Mcguire is a 80 y.o. female who  has a past medical history of Hyperlipidemia; Diverticulitis; CAD (coronary artery disease); AF (atrial fibrillation); Hypertension; Seasonal allergies; Prediabetes; Depression; Shortness of breath dyspnea; Anxiety; Urinary frequency; GERD (gastroesophageal reflux disease); Sleep apnea; Arthritis; Breast cancer; and Non-small cell carcinoma of lung, stage 3 (04/30/2015). and presents today for an office visit to establish care.   1.) Depression - Associated symptom of lack of motivation, fatigue, staying in bed, and frequent sleeping has been going on for a few months. Currently maintained on Lexapro. Takes the medication as prescribed and denies adverse side effects of the medication. Denies thoughts of suicide. Daughter indicates that she does not perform activities like she used to and is concerned about her mental status.   2.) Fatigue - Associated symptom of fatigue and weakness has been going on for a couple of months following the start of her chemotherapy for Stage 3 non-small cell lung cancer. Previous blood work from 9/6 reveals decreased hemoglobin with normocytic anemia and no other significant potential causes for her fatigue.   3.) Diarrhea - Associated symptom of diarrhea and with bowel movements occuring 3-4 times per day on average have been going on since she started treatment for her lung cancer. Denies any attempts treatment or modifying factors that make her symptoms better. Denies any current abdominal pain.   No Known Allergies   Outpatient Prescriptions Prior to Visit  Medication Sig Dispense Refill  . acetaminophen (TYLENOL) 500 MG tablet Take  500-1,000 mg by mouth every 6 (six) hours as needed for mild pain or headache.    . albuterol (PROVENTIL HFA;VENTOLIN HFA) 108 (90 BASE) MCG/ACT inhaler Inhale 1 puff into the lungs every 6 (six) hours as needed for wheezing or shortness of breath.    . Cholecalciferol (VITAMIN D PO) Take 1 tablet by mouth every morning.     . Cyanocobalamin (VITAMIN B-12 PO) Take 1 capsule by mouth every morning.    Marland Kitchen ELIQUIS 5 MG TABS tablet TAKE 1 TABLET BY MOUTH TWICE DAILY 60 tablet 5  . emollient (BIAFINE) cream Apply topically as needed. 454 g 0  . escitalopram (LEXAPRO) 10 MG tablet Take 1 tablet (10 mg total) by mouth daily. 30 tablet 5  . ezetimibe-simvastatin (VYTORIN) 10-40 MG per tablet Take 1 tablet by mouth daily. 90 tablet 3  . feeding supplement (BOOST / RESOURCE BREEZE) LIQD Take 1 Container by mouth 3 (three) times daily between meals. 90 Container 0  . folic acid (FOLVITE) 1 MG tablet TAKE 1 TABLET BY MOUTH TWICE DAILY    . HYDROcodone-acetaminophen (NORCO/VICODIN) 5-325 MG per tablet Take 1 tablet by mouth every 6 (six) hours as needed for moderate pain. 30 tablet 0  . loratadine (CLARITIN) 10 MG tablet Take 10 mg by mouth at bedtime as needed for allergies.    Marland Kitchen LORazepam (ATIVAN) 1 MG tablet Take 1 tablet (1 mg total) by mouth at bedtime. 45 tablet 0  . Magnesium Chloride 535 (64 MG) MG TBCR Take 512 mg by mouth.    . magnesium oxide (MAG-OX) 400 (241.3 MG) MG tablet Take 1 tablet (400 mg total)  by mouth 2 (two) times daily. 60 tablet 0  . mometasone-formoterol (DULERA) 100-5 MCG/ACT AERO Inhale 2 puffs into the lungs 2 (two) times daily as needed for wheezing.     . pantoprazole (PROTONIX) 40 MG tablet Take 40 mg by mouth daily.    . potassium chloride 20 MEQ TBCR Take 10 mEq by mouth daily. 30 tablet 0  . Probiotic Product (PROBIOTIC PO) Take 1 capsule by mouth 2 (two) times daily.    . sucralfate (CARAFATE) 1 G tablet Take 1 tablet (1 g total) by mouth 4 (four) times daily -  with meals  and at bedtime. 5 min before meals for radiation induced esophagitis 120 tablet 2  . metoprolol tartrate (LOPRESSOR) 25 MG tablet Take 2 tablets (50 mg total) by mouth 2 (two) times daily. (Patient not taking: Reported on 08/18/2015) 180 tablet 2  . prochlorperazine (COMPAZINE) 10 MG tablet Take 1 tablet (10 mg total) by mouth every 6 (six) hours as needed for nausea or vomiting. 30 tablet 0  . traZODone (DESYREL) 50 MG tablet TAKE 1 TABLET BY MOUTH NIGHTLY    . Wound Cleansers (RADIAPLEX EX) Apply topically.     No facility-administered medications prior to visit.     Past Medical History  Diagnosis Date  . Hyperlipidemia   . Diverticulitis   . CAD (coronary artery disease)   . AF (atrial fibrillation)   . Hypertension   . Seasonal allergies   . Prediabetes   . Depression   . Shortness of breath dyspnea   . Anxiety   . Urinary frequency   . GERD (gastroesophageal reflux disease)   . Sleep apnea      dx'd 10/2014, has not received cpap yet (04/28/2015)  . Arthritis     "thighs, hips, arms" (04/28/2015)  . Breast cancer     right mastectomy  . Non-small cell carcinoma of lung, stage 3 04/30/2015     Past Surgical History  Procedure Laterality Date  . Cardioversion    . Tonsillectomy    . Colonoscopy    . Cesarean section  X 2  . Mastectomy Right   . Breast surgery    . Mediastinoscopy N/A 04/28/2015    Procedure: MEDIASTINOSCOPY;  Surgeon: [name], MD;  Location: [facility] OR;  Service: Thoracic;  Laterality: N/A;     History reviewed. No pertinent family history.   Social History   Social History  . Marital Status: Widowed    Spouse Name: N/A  . Number of Children: 4  . Years of Education: 12   Occupational History  . Not on file.   Social History Main Topics  . Smoking status: Former Smoker -- 1.00 packs/day for 35 years    Types: Cigarettes    Quit date: 11/28/1992  . Smokeless tobacco: Never Used     Comment: "stopped smoking at age 67"  . Alcohol Use:  5.4 oz/week    9 Glasses of wine, 0 Standard drinks or equivalent per week     Comment: 04/28/2015 "drink 3, 6oz,  glasses of wine on Sat & Sun"  . Drug Use: No  . Sexual Activity: No   Other Topics Concern  . Not on file   Social History Narrative   Fun: Word search and read   Denies abuse and feels safe at home    Review of Systems  Constitutional: Positive for appetite change. Negative for fever and chills.  Gastrointestinal: Positive for diarrhea. Negative for nausea and vomiting.  Psychiatric/Behavioral: Positive for dysphoric mood and decreased concentration. Negative for behavioral problems, sleep disturbance and agitation.      Objective:    BP 114/70 mmHg  Pulse 101  Temp(Src) 97.2 F (36.2 C) (Oral)  Resp 18  Ht '5\' 6"'$  (1.676 m)  Wt 175 lb (79.379 kg)  BMI 28.26 kg/m2  SpO2 96% Nursing note and vital signs reviewed.  Physical Exam  Constitutional: She is oriented to person, place, and time. She appears well-developed and well-nourished. No distress.  Elderly female appears seated in a wheelchair with a leg immobilizer on her right leg. Dressed appropriately for the situation and appears slightly fatigued and down.   Cardiovascular: Normal rate, regular rhythm, normal heart sounds and intact distal pulses.   Pulmonary/Chest: Effort normal and breath sounds normal.  Abdominal: Soft. Bowel sounds are normal. She exhibits no distension and no mass. There is no tenderness. There is no rebound and no guarding.  Neurological: She is alert and oriented to person, place, and time.  Skin: Skin is warm and dry.  Psychiatric: She has a normal mood and affect. Her behavior is normal. Judgment and thought content normal.       Assessment & Plan:   Problem List Items Addressed This Visit      Cardiovascular and Mediastinum   Paroxysmal a-fib    Currently anticoagulated with Elliquis and no evidence of nuisense bleeding. Cardiac exam revealed regular rate and rhythm.           Respiratory   Severe obstructive sleep apnea    Not currently using her CPAP which may also be contributing to her current fatigue. Encouraged CPAP usage.         Other   Fatigue - Primary    Symptoms of fatigue of undetermined origin and most likely multifactorial in nature including possibilities of depression, decreased nutrition, and lung cancer therapies. Obtain B12 and TSH to rule out other metabolic causes. Depression is currently maintained on Lexapro. There is also a mild normocytic anemia which may also be a contributing factor. Treat conservatively with nutrition and adequate rest. Continue previously prescribed medications. Follow up pending lab work.       Relevant Orders   TSH (Completed)   B12 (Completed)   Depression    Symptoms consistent with depression that is currently maintained with Lexapro and appears to be labile. Denies suicidal ideations. Continue current dosage of Lexapro pending blood work results for TSH. Consider increasing Lexapro or additional medications if symptoms are not related to hypothyroidism.       Relevant Orders   TSH (Completed)   B12 (Completed)   Frequent stools    Frequent stools most likely related to chemotherapy and radiation. Discussed continued dosage of probiotic and immodium as needed, however with caution to not result in constipation. Follow up if symptoms worsen or fail to improve.

## 2015-08-19 ENCOUNTER — Other Ambulatory Visit: Payer: Self-pay | Admitting: Family

## 2015-08-31 ENCOUNTER — Ambulatory Visit (INDEPENDENT_AMBULATORY_CARE_PROVIDER_SITE_OTHER): Payer: Medicare HMO | Admitting: Internal Medicine

## 2015-08-31 ENCOUNTER — Encounter: Payer: Self-pay | Admitting: Internal Medicine

## 2015-08-31 VITALS — BP 112/60 | HR 96 | Ht 65.0 in | Wt 172.0 lb

## 2015-08-31 DIAGNOSIS — R001 Bradycardia, unspecified: Secondary | ICD-10-CM

## 2015-08-31 DIAGNOSIS — I10 Essential (primary) hypertension: Secondary | ICD-10-CM

## 2015-08-31 DIAGNOSIS — R0609 Other forms of dyspnea: Secondary | ICD-10-CM | POA: Diagnosis not present

## 2015-08-31 DIAGNOSIS — G4733 Obstructive sleep apnea (adult) (pediatric): Secondary | ICD-10-CM

## 2015-08-31 DIAGNOSIS — C3411 Malignant neoplasm of upper lobe, right bronchus or lung: Secondary | ICD-10-CM | POA: Diagnosis not present

## 2015-08-31 DIAGNOSIS — R5383 Other fatigue: Secondary | ICD-10-CM

## 2015-08-31 MED ORDER — CARVEDILOL 3.125 MG PO TABS: 3.1250 mg | ORAL_TABLET | Freq: Two times a day (BID) | ORAL | Status: DC

## 2015-08-31 NOTE — Patient Instructions (Signed)
Your physician has recommended you make the following change in your medication: START carvedilol 3.'125mg'$  twice daily  Your physician wants you to follow-up in: 6 months with Dr. Debara Pickett. You will receive a reminder letter in the mail two months in advance. If you don't receive a letter, please call our office to schedule the follow-up appointment.

## 2015-08-31 NOTE — Progress Notes (Signed)
OFFICE NOTE  Chief Complaint:  Fatigue, shortness of breath  Primary Care Physician: Mauricio Po, FNP  HPI:  Pamela Mcguire is a pleasant 80 year old female who recently moved from the [place] area back to [place]. She is here today to establish cardiology care. She apparently was recently hospitalized and quite ill in the intensive care unit. She was suffering from diverticulitis and went into atrial fibrillation. She does not recall many of the events however does report that she was cardioverted and started on Rythmol. She must is seen a cardiologist in the hospital however did not have cardiology followup. She has been maintained on this medication and is in sinus rhythm today. She was not started on anticoagulation which is very unusual. She does have a history of hypertension, age greater than 87 and dyslipidemia. Her CHADSVASC score is 2, therefore, long-term anticoagulation is indicated. There are also some discrepancies in her medications. It appears that she has 3 different cholesterol medications.  She denies any chest pain with exertion but does get short of breath. She denies any palpitations, presyncope or syncopal episodes. She reports that she never had a stress test. She denies suffering an MI in the hospital.  Pamela Mcguire returns today for followup. I'm pleased to report her nuclear stress test was negative for ischemia. She has since started Eliquis and is tolerating it well without bleeding problems. We simplified her cholesterol medications and she is currently on Vytorin. She does report poor sleep at night and napping during the day. She says she has to urinate several times at night. She is noted to have a history of snoring but is unclear whether she has any true apnea. She often has trouble falling asleep as well as staying asleep.  I saw Pamela Mcguire back in the office today. She is reporting some mild positional dizziness. She also reports of chest pain when  laying down at night. Although she recently had a stress test as above which was negative. She continues to be fatigued throughout the day. She did undergo a sleep study which was markedly abnormal demonstrating an AHI of close to 50. She is scheduled to have a CPAP titration on March 28. I think this will make a significant difference in her symptoms. She reports no recurrence of her atrial fibrillation and is off Rythmol.  Pamela Mcguire returns today in the office. She denies any recurrent A. fib and is in a sinus rhythm today. There are occasional PVCs. She is actually off of all of her blood pressure medicine due to hypotension. She's had significant weight loss. She's been diagnosed with a lung cancer and is undergone chemotherapy. She apparently said all the chemotherapy that she can tolerate however the tumor is not resolved, but has shrunk. She short of breath most likely related to this. She continues on Eliquis without any bleeding problems. She is intolerant to CPAP despite her severe obstructive sleep apnea due to anxiety. She was previously on Xanax however is out of that today. I advised her that I did not prescribe benzodiazepine's or narcotics and that she should seek out her primary care provider for this.  PMHx:  Past Medical History  Diagnosis Date  . Hyperlipidemia   . Diverticulitis   . CAD (coronary artery disease)   . AF (atrial fibrillation) ([place])   . Hypertension   . Seasonal allergies   . Prediabetes   . Depression   . Shortness of breath dyspnea   . Anxiety   .  Urinary frequency   . GERD (gastroesophageal reflux disease)   . Sleep apnea      dx'd 10/2014, has not received cpap yet (04/28/2015)  . Arthritis     "thighs, hips, arms" (04/28/2015)  . Breast cancer ([place])     right mastectomy  . Non-small cell carcinoma of lung, stage 3 ([place]) 04/30/2015    Past Surgical History  Procedure Laterality Date  . Cardioversion    . Tonsillectomy    . Colonoscopy    .  Cesarean section  X 2  . Mastectomy Right   . Breast surgery    . Mediastinoscopy N/A 04/28/2015    Procedure: MEDIASTINOSCOPY;  Surgeon: [name], MD;  Location: [facility] OR;  Service: Thoracic;  Laterality: N/A;    FAMHx:  History reviewed. No pertinent family history.  SOCHx:   reports that she quit smoking about 22 years ago. Her smoking use included Cigarettes. She has a 35 pack-year smoking history. She has never used smokeless tobacco. She reports that she drinks about 5.4 oz of alcohol per week. She reports that she does not use illicit drugs.  ALLERGIES:  No Known Allergies  ROS: A comprehensive review of systems was negative except for: Constitutional: positive for insomnia and daytime hypersomnolence Respiratory: positive for dyspnea on exertion Behavioral/Psych: positive for anxiety  HOME MEDS: Current Outpatient Prescriptions  Medication Sig Dispense Refill  . acetaminophen (TYLENOL) 500 MG tablet Take 500-1,000 mg by mouth every 6 (six) hours as needed for mild pain or headache.    . albuterol (PROVENTIL HFA;VENTOLIN HFA) 108 (90 BASE) MCG/ACT inhaler Inhale 1 puff into the lungs every 6 (six) hours as needed for wheezing or shortness of breath.    Marland Kitchen buPROPion (WELLBUTRIN SR) 100 MG 12 hr tablet TAKE 1 TABLET(100 MG) BY MOUTH DAILY 90 tablet 0  . Cholecalciferol (VITAMIN D PO) Take 1 tablet by mouth every morning.     . Cyanocobalamin (VITAMIN B-12 PO) Take 1 capsule by mouth every morning.    Marland Kitchen ELIQUIS 5 MG TABS tablet TAKE 1 TABLET BY MOUTH TWICE DAILY 60 tablet 5  . emollient (BIAFINE) cream Apply topically as needed. 454 g 0  . escitalopram (LEXAPRO) 10 MG tablet Take 1 tablet (10 mg total) by mouth daily. 30 tablet 5  . ezetimibe-simvastatin (VYTORIN) 10-40 MG per tablet Take 1 tablet by mouth daily. 90 tablet 3  . feeding supplement (BOOST / RESOURCE BREEZE) LIQD Take 1 Container by mouth 3 (three) times daily between meals. 90 Container 0  . folic acid (FOLVITE)  1 MG tablet TAKE 1 TABLET BY MOUTH TWICE DAILY    . HYDROcodone-acetaminophen (NORCO/VICODIN) 5-325 MG per tablet Take 1 tablet by mouth every 6 (six) hours as needed for moderate pain. 30 tablet 0  . loratadine (CLARITIN) 10 MG tablet Take 10 mg by mouth at bedtime as needed for allergies.    Marland Kitchen LORazepam (ATIVAN) 1 MG tablet Take 1 tablet (1 mg total) by mouth at bedtime. 45 tablet 0  . Magnesium Chloride 535 (64 MG) MG TBCR Take 512 mg by mouth.    . magnesium oxide (MAG-OX) 400 (241.3 MG) MG tablet Take 1 tablet (400 mg total) by mouth 2 (two) times daily. 60 tablet 0  . mometasone-formoterol (DULERA) 100-5 MCG/ACT AERO Inhale 2 puffs into the lungs 2 (two) times daily as needed for wheezing.     . pantoprazole (PROTONIX) 40 MG tablet Take 40 mg by mouth daily.    . potassium chloride  20 MEQ TBCR Take 10 mEq by mouth daily. 30 tablet 0  . Probiotic Product (PROBIOTIC PO) Take 1 capsule by mouth 2 (two) times daily.    . sucralfate (CARAFATE) 1 G tablet Take 1 tablet (1 g total) by mouth 4 (four) times daily -  with meals and at bedtime. 5 min before meals for radiation induced esophagitis 120 tablet 2  . carvedilol (COREG) 3.125 MG tablet Take 1 tablet (3.125 mg total) by mouth 2 (two) times daily. 180 tablet 3   No current facility-administered medications for this visit.    LABS/IMAGING: No results found for this or any previous visit (from the past 48 hour(s)). No results found.  VITALS: BP 112/60 mmHg  Pulse 96  Ht '5\' 5"'$  (1.651 m)  Wt 172 lb (78.019 kg)  BMI 28.62 kg/m2  EXAM: General appearance: alert and mild distress Neck: no carotid bruit and no JVD Lungs: diminished breath sounds bilaterally Heart: regular rate and rhythm, S1, S2 normal, no murmur, click, rub or gallop Abdomen: soft, non-tender; bowel sounds normal; no masses,  no organomegaly Extremities: extremities normal, atraumatic, no cyanosis or edema Pulses: 2+ and symmetric Skin: Pale, warm, dry Neurologic:  Mental status: Alert, oriented, thought content appropriate Psych: Appears fatigued  LTJ:QZESP rhythm with occasional PVCs at 96   ASSESSMENT: 1. Paroxysmal atrial fibrillation-maintaining sinus rhythm 2. Hypertension- off of medications due to hypotension 3. Dyslipidemia on Vytorin 4. Dyspnea on exertion - newly diagnosed lung cancer  5. Relative bradycardia-fatigue 6. Insomnia/somnolence - severe sleep apnea, intolerant to CPAP due to anxiety  PLAN: 1.   Pamela Mcguire  is in a difficult situation. She has lung cancer but apparently cannot receive anymore chemotherapy. She continues to be short of breath. She has fatigue and insomnia and cannot tolerate CPAP due to anxiety. She was recently started on Wellbutrin and may need additional Xanax to her primary care provider. She is not currently on any medicine for rate control but does have a history of A. fib. She will need to be on some medicine to keep her from having A. fib with RVR if she should go back out of rhythm. I recommend starting low-dose carvedilol 3.125 mg twice a day. She seems to be tolerating Eliquis and should stay on that medication. Plan to see her back in 6 months.   Pixie Casino, MD, [facility] Attending Cardiologist [place] 08/31/2015, 3:41 PM

## 2015-09-01 ENCOUNTER — Ambulatory Visit: Payer: Medicare HMO | Admitting: Internal Medicine

## 2015-09-07 ENCOUNTER — Other Ambulatory Visit: Payer: Self-pay | Admitting: Radiation Oncology

## 2015-09-08 ENCOUNTER — Telehealth: Payer: Self-pay | Admitting: Radiation Oncology

## 2015-09-08 NOTE — Telephone Encounter (Signed)
Phoned all numbers listed for patient and her daughters. No answer. Dr. Tammi Klippel isn't in the office today. Dr. Debbe Odea, internal medicine, fill ativan last. Will continue to try to reach patient or her daughters.

## 2015-09-08 NOTE — Telephone Encounter (Signed)
Left msg on triage requesting refill on mother lorazepam.../lmb

## 2015-09-09 ENCOUNTER — Telehealth: Payer: Self-pay | Admitting: Radiation Oncology

## 2015-09-09 NOTE — Telephone Encounter (Signed)
Phoned patient's daughter, [name]. No answer. Left message informing her the ativan refill that was requested has been E-scribed to [facility] in [place].

## 2015-09-10 ENCOUNTER — Telehealth: Payer: Self-pay | Admitting: Radiation Oncology

## 2015-09-10 NOTE — Telephone Encounter (Signed)
Left message for daughter, [name], that script for Ativan was called in again.

## 2015-09-10 NOTE — Telephone Encounter (Signed)
Per Dr. Johny Shears order called in Ativan 1 mg to [facility], [place].

## 2015-09-11 ENCOUNTER — Telehealth: Payer: Self-pay | Admitting: Radiation Oncology

## 2015-09-11 NOTE — Telephone Encounter (Signed)
Drug Therapy Recommendation for Fosamax sent from [facility] drug [phone]. Returned fax requesting it be sent to Dr. Worthy Flank office for approval.

## 2015-09-13 ENCOUNTER — Emergency Department (HOSPITAL_COMMUNITY): Payer: Medicare HMO

## 2015-09-13 ENCOUNTER — Inpatient Hospital Stay (HOSPITAL_COMMUNITY)
Admission: EM | Admit: 2015-09-13 | Discharge: 2015-09-17 | DRG: 551 | Disposition: A | Discharge status: Skilled Nursing Facility | Payer: Medicare HMO | Attending: Internal Medicine | Admitting: Internal Medicine

## 2015-09-13 ENCOUNTER — Inpatient Hospital Stay (HOSPITAL_COMMUNITY): Payer: Medicare HMO

## 2015-09-13 ENCOUNTER — Encounter (HOSPITAL_COMMUNITY): Payer: Self-pay

## 2015-09-13 DIAGNOSIS — G4730 Sleep apnea, unspecified: Secondary | ICD-10-CM | POA: Diagnosis present

## 2015-09-13 DIAGNOSIS — Z87891 Personal history of nicotine dependence: Secondary | ICD-10-CM

## 2015-09-13 DIAGNOSIS — E440 Moderate protein-calorie malnutrition: Secondary | ICD-10-CM | POA: Diagnosis present

## 2015-09-13 DIAGNOSIS — I4891 Unspecified atrial fibrillation: Secondary | ICD-10-CM | POA: Diagnosis present

## 2015-09-13 DIAGNOSIS — Z791 Long term (current) use of non-steroidal anti-inflammatories (NSAID): Secondary | ICD-10-CM | POA: Diagnosis not present

## 2015-09-13 DIAGNOSIS — K210 Gastro-esophageal reflux disease with esophagitis: Secondary | ICD-10-CM | POA: Diagnosis present

## 2015-09-13 DIAGNOSIS — Y92099 Unspecified place in other non-institutional residence as the place of occurrence of the external cause: Secondary | ICD-10-CM

## 2015-09-13 DIAGNOSIS — F329 Major depressive disorder, single episode, unspecified: Secondary | ICD-10-CM | POA: Diagnosis present

## 2015-09-13 DIAGNOSIS — R531 Weakness: Secondary | ICD-10-CM

## 2015-09-13 DIAGNOSIS — S32018A Other fracture of first lumbar vertebra, initial encounter for closed fracture: Secondary | ICD-10-CM | POA: Diagnosis present

## 2015-09-13 DIAGNOSIS — Z85118 Personal history of other malignant neoplasm of bronchus and lung: Secondary | ICD-10-CM | POA: Diagnosis not present

## 2015-09-13 DIAGNOSIS — Z79899 Other long term (current) drug therapy: Secondary | ICD-10-CM | POA: Diagnosis not present

## 2015-09-13 DIAGNOSIS — D638 Anemia in other chronic diseases classified elsewhere: Secondary | ICD-10-CM | POA: Diagnosis present

## 2015-09-13 DIAGNOSIS — I10 Essential (primary) hypertension: Secondary | ICD-10-CM | POA: Diagnosis present

## 2015-09-13 DIAGNOSIS — I2510 Atherosclerotic heart disease of native coronary artery without angina pectoris: Secondary | ICD-10-CM | POA: Diagnosis present

## 2015-09-13 DIAGNOSIS — W1811XA Fall from or off toilet without subsequent striking against object, initial encounter: Secondary | ICD-10-CM | POA: Diagnosis present

## 2015-09-13 DIAGNOSIS — E860 Dehydration: Secondary | ICD-10-CM | POA: Diagnosis present

## 2015-09-13 DIAGNOSIS — Z6828 Body mass index (BMI) 28.0-28.9, adult: Secondary | ICD-10-CM

## 2015-09-13 DIAGNOSIS — C3411 Malignant neoplasm of upper lobe, right bronchus or lung: Secondary | ICD-10-CM | POA: Diagnosis present

## 2015-09-13 DIAGNOSIS — F419 Anxiety disorder, unspecified: Secondary | ICD-10-CM | POA: Diagnosis present

## 2015-09-13 DIAGNOSIS — R627 Adult failure to thrive: Secondary | ICD-10-CM | POA: Diagnosis present

## 2015-09-13 DIAGNOSIS — Z7409 Other reduced mobility: Secondary | ICD-10-CM | POA: Insufficient documentation

## 2015-09-13 DIAGNOSIS — M1990 Unspecified osteoarthritis, unspecified site: Secondary | ICD-10-CM | POA: Diagnosis present

## 2015-09-13 DIAGNOSIS — Z7901 Long term (current) use of anticoagulants: Secondary | ICD-10-CM

## 2015-09-13 DIAGNOSIS — N390 Urinary tract infection, site not specified: Secondary | ICD-10-CM | POA: Diagnosis present

## 2015-09-13 DIAGNOSIS — R296 Repeated falls: Secondary | ICD-10-CM | POA: Diagnosis present

## 2015-09-13 DIAGNOSIS — E785 Hyperlipidemia, unspecified: Secondary | ICD-10-CM | POA: Diagnosis present

## 2015-09-13 DIAGNOSIS — K5900 Constipation, unspecified: Secondary | ICD-10-CM | POA: Diagnosis present

## 2015-09-13 DIAGNOSIS — W19XXXA Unspecified fall, initial encounter: Secondary | ICD-10-CM

## 2015-09-13 DIAGNOSIS — Y92009 Unspecified place in unspecified non-institutional (private) residence as the place of occurrence of the external cause: Secondary | ICD-10-CM

## 2015-09-13 DIAGNOSIS — M549 Dorsalgia, unspecified: Secondary | ICD-10-CM | POA: Diagnosis not present

## 2015-09-13 DIAGNOSIS — S32019A Unspecified fracture of first lumbar vertebra, initial encounter for closed fracture: Secondary | ICD-10-CM

## 2015-09-13 DIAGNOSIS — Z87440 Personal history of urinary (tract) infections: Secondary | ICD-10-CM | POA: Diagnosis not present

## 2015-09-13 DIAGNOSIS — J189 Pneumonia, unspecified organism: Secondary | ICD-10-CM | POA: Diagnosis present

## 2015-09-13 DIAGNOSIS — Z853 Personal history of malignant neoplasm of breast: Secondary | ICD-10-CM

## 2015-09-13 DIAGNOSIS — I480 Paroxysmal atrial fibrillation: Secondary | ICD-10-CM | POA: Diagnosis not present

## 2015-09-13 DIAGNOSIS — Y95 Nosocomial condition: Secondary | ICD-10-CM | POA: Diagnosis present

## 2015-09-13 DIAGNOSIS — I482 Chronic atrial fibrillation: Secondary | ICD-10-CM | POA: Diagnosis not present

## 2015-09-13 DIAGNOSIS — Y842 Radiological procedure and radiotherapy as the cause of abnormal reaction of the patient, or of later complication, without mention of misadventure at the time of the procedure: Secondary | ICD-10-CM | POA: Diagnosis present

## 2015-09-13 DIAGNOSIS — R2681 Unsteadiness on feet: Secondary | ICD-10-CM | POA: Diagnosis present

## 2015-09-13 DIAGNOSIS — B9620 Unspecified Escherichia coli [E. coli] as the cause of diseases classified elsewhere: Secondary | ICD-10-CM | POA: Diagnosis present

## 2015-09-13 DIAGNOSIS — IMO0002 Reserved for concepts with insufficient information to code with codable children: Secondary | ICD-10-CM

## 2015-09-13 DIAGNOSIS — I959 Hypotension, unspecified: Secondary | ICD-10-CM | POA: Diagnosis not present

## 2015-09-13 DIAGNOSIS — T148 Other injury of unspecified body region: Secondary | ICD-10-CM | POA: Diagnosis not present

## 2015-09-13 DIAGNOSIS — Z79891 Long term (current) use of opiate analgesic: Secondary | ICD-10-CM

## 2015-09-13 DIAGNOSIS — E278 Other specified disorders of adrenal gland: Secondary | ICD-10-CM

## 2015-09-13 DIAGNOSIS — Y92002 Bathroom of unspecified non-institutional (private) residence single-family (private) house as the place of occurrence of the external cause: Secondary | ICD-10-CM

## 2015-09-13 LAB — CBC WITH DIFFERENTIAL/PLATELET
BASOS ABS: 0 10*3/uL (ref 0.0–0.1)
Basophils Relative: 0 %
EOS PCT: 1 %
Eosinophils Absolute: 0 10*3/uL (ref 0.0–0.7)
HEMATOCRIT: 34.2 % — AB (ref 36.0–46.0)
HEMOGLOBIN: 11.1 g/dL — AB (ref 12.0–15.0)
LYMPHS PCT: 29 %
Lymphs Abs: 1.7 10*3/uL (ref 0.7–4.0)
MCH: 28.4 pg (ref 26.0–34.0)
MCHC: 32.5 g/dL (ref 30.0–36.0)
MCV: 87.5 fL (ref 78.0–100.0)
Monocytes Absolute: 0.8 10*3/uL (ref 0.1–1.0)
Monocytes Relative: 14 %
NEUTROS ABS: 3.2 10*3/uL (ref 1.7–7.7)
NEUTROS PCT: 56 %
PLATELETS: 340 10*3/uL (ref 150–400)
RBC: 3.91 MIL/uL (ref 3.87–5.11)
RDW: 14.2 % (ref 11.5–15.5)
WBC: 5.6 10*3/uL (ref 4.0–10.5)

## 2015-09-13 LAB — COMPREHENSIVE METABOLIC PANEL
ALT: 12 U/L — AB (ref 14–54)
AST: 19 U/L (ref 15–41)
Albumin: 3.4 g/dL — ABNORMAL LOW (ref 3.5–5.0)
Alkaline Phosphatase: 56 U/L (ref 38–126)
Anion gap: 9 (ref 5–15)
BUN: 16 mg/dL (ref 6–20)
CHLORIDE: 103 mmol/L (ref 101–111)
CO2: 26 mmol/L (ref 22–32)
CREATININE: 0.75 mg/dL (ref 0.44–1.00)
Calcium: 9.4 mg/dL (ref 8.9–10.3)
GFR calc Af Amer: >60 mL/min (ref >60)
Glucose, Bld: 110 mg/dL — ABNORMAL HIGH (ref 65–99)
Potassium: 4.1 mmol/L (ref 3.5–5.1)
Sodium: 138 mmol/L (ref 135–145)
Total Bilirubin: 0.7 mg/dL (ref 0.3–1.2)
Total Protein: 7.7 g/dL (ref 6.5–8.1)

## 2015-09-13 LAB — PROTIME-INR
INR: 1.21 (ref 0.00–1.49)
PROTHROMBIN TIME: 15.4 s — AB (ref 11.6–15.2)

## 2015-09-13 LAB — URINALYSIS, ROUTINE W REFLEX MICROSCOPIC
BILIRUBIN URINE: NEGATIVE
Glucose, UA: NEGATIVE mg/dL
KETONES UR: 15 mg/dL — AB
NITRITE: NEGATIVE
PROTEIN: NEGATIVE mg/dL
Specific Gravity, Urine: 1.023 (ref 1.005–1.030)
UROBILINOGEN UA: 0.2 mg/dL (ref 0.0–1.0)
pH: 6.5 (ref 5.0–8.0)

## 2015-09-13 LAB — URINE MICROSCOPIC-ADD ON

## 2015-09-13 MED ORDER — HYDROMORPHONE HCL 1 MG/ML IJ SOLN
0.5000 mg | Freq: Once | INTRAMUSCULAR | Status: AC
  Administered 2015-09-13: 0.5 mg via INTRAVENOUS
  Filled 2015-09-13: qty 1

## 2015-09-13 MED ORDER — ONDANSETRON HCL 4 MG/2ML IJ SOLN
4.0000 mg | Freq: Four times a day (QID) | INTRAMUSCULAR | Status: DC | PRN
  Administered 2015-09-13 – 2015-09-16 (×3): 4 mg via INTRAVENOUS
  Filled 2015-09-13 (×3): qty 2

## 2015-09-13 MED ORDER — MAGNESIUM OXIDE 400 (241.3 MG) MG PO TABS
400.0000 mg | ORAL_TABLET | Freq: Two times a day (BID) | ORAL | Status: DC
  Administered 2015-09-14 – 2015-09-16 (×6): 400 mg via ORAL
  Filled 2015-09-13 (×9): qty 1

## 2015-09-13 MED ORDER — ACETAMINOPHEN 500 MG PO TABS
1000.0000 mg | ORAL_TABLET | Freq: Three times a day (TID) | ORAL | Status: DC
Start: 2015-09-13 — End: 2015-09-17
  Administered 2015-09-13 – 2015-09-17 (×11): 1000 mg via ORAL
  Filled 2015-09-13 (×17): qty 2

## 2015-09-13 MED ORDER — OXYCODONE-ACETAMINOPHEN 5-325 MG PO TABS: 1.0000 | ORAL_TABLET | ORAL | Status: DC | PRN

## 2015-09-13 MED ORDER — ONDANSETRON HCL 4 MG PO TABS: 4.0000 mg | ORAL_TABLET | Freq: Four times a day (QID) | ORAL | Status: DC | PRN

## 2015-09-13 MED ORDER — MOMETASONE FURO-FORMOTEROL FUM 100-5 MCG/ACT IN AERO
2.0000 | INHALATION_SPRAY | Freq: Two times a day (BID) | RESPIRATORY_TRACT | Status: DC
  Administered 2015-09-13 – 2015-09-17 (×7): 2 via RESPIRATORY_TRACT
  Filled 2015-09-13: qty 8.8

## 2015-09-13 MED ORDER — OXYCODONE HCL 5 MG PO TABS
5.0000 mg | ORAL_TABLET | ORAL | Status: DC | PRN
  Administered 2015-09-13 – 2015-09-17 (×13): 5 mg via ORAL
  Filled 2015-09-13 (×14): qty 1

## 2015-09-13 MED ORDER — DIAZEPAM 5 MG/ML IJ SOLN
5.0000 mg | Freq: Once | INTRAMUSCULAR | Status: AC
  Administered 2015-09-13: 5 mg via INTRAVENOUS
  Filled 2015-09-13: qty 2

## 2015-09-13 MED ORDER — SENNA 8.6 MG PO TABS
1.0000 | ORAL_TABLET | Freq: Two times a day (BID) | ORAL | Status: DC
  Administered 2015-09-14 – 2015-09-15 (×4): 8.6 mg via ORAL

## 2015-09-13 MED ORDER — EZETIMIBE-SIMVASTATIN 10-40 MG PO TABS
1.0000 | ORAL_TABLET | Freq: Every day | ORAL | Status: DC
  Administered 2015-09-14 – 2015-09-17 (×4): 1 via ORAL
  Filled 2015-09-13 (×5): qty 1

## 2015-09-13 MED ORDER — ALUM & MAG HYDROXIDE-SIMETH 200-200-20 MG/5ML PO SUSP: 30.0000 mL | Freq: Four times a day (QID) | ORAL | Status: DC | PRN

## 2015-09-13 MED ORDER — MORPHINE SULFATE (PF) 2 MG/ML IV SOLN
2.0000 mg | INTRAVENOUS | Status: DC | PRN
  Administered 2015-09-13 – 2015-09-14 (×2): 2 mg via INTRAVENOUS
  Filled 2015-09-13 (×2): qty 1

## 2015-09-13 MED ORDER — LORAZEPAM 1 MG PO TABS
1.0000 mg | ORAL_TABLET | Freq: Three times a day (TID) | ORAL | Status: DC | PRN
  Administered 2015-09-13 – 2015-09-16 (×4): 1 mg via ORAL
  Filled 2015-09-13 (×5): qty 1

## 2015-09-13 MED ORDER — CARVEDILOL 3.125 MG PO TABS
3.1250 mg | ORAL_TABLET | Freq: Two times a day (BID) | ORAL | Status: DC
  Administered 2015-09-13 – 2015-09-17 (×7): 3.125 mg via ORAL
  Filled 2015-09-13 (×10): qty 1

## 2015-09-13 MED ORDER — PANTOPRAZOLE SODIUM 40 MG PO TBEC
40.0000 mg | DELAYED_RELEASE_TABLET | Freq: Every day | ORAL | Status: DC
  Administered 2015-09-14 – 2015-09-17 (×4): 40 mg via ORAL
  Filled 2015-09-13 (×5): qty 1

## 2015-09-13 MED ORDER — ALBUTEROL SULFATE (2.5 MG/3ML) 0.083% IN NEBU: 2.5000 mg | INHALATION_SOLUTION | Freq: Four times a day (QID) | RESPIRATORY_TRACT | Status: DC | PRN

## 2015-09-13 MED ORDER — MORPHINE SULFATE (PF) 4 MG/ML IV SOLN
4.0000 mg | Freq: Once | INTRAVENOUS | Status: AC
  Administered 2015-09-13: 4 mg via INTRAVENOUS
  Filled 2015-09-13: qty 1

## 2015-09-13 MED ORDER — ESCITALOPRAM OXALATE 10 MG PO TABS
10.0000 mg | ORAL_TABLET | Freq: Every day | ORAL | Status: DC
  Administered 2015-09-14 – 2015-09-17 (×4): 10 mg via ORAL
  Filled 2015-09-13 (×5): qty 1

## 2015-09-13 MED ORDER — APIXABAN 5 MG PO TABS
5.0000 mg | ORAL_TABLET | Freq: Two times a day (BID) | ORAL | Status: DC
  Administered 2015-09-13 – 2015-09-17 (×8): 5 mg via ORAL
  Filled 2015-09-13 (×9): qty 1

## 2015-09-13 MED ORDER — BUPROPION HCL ER (SR) 100 MG PO TB12
100.0000 mg | ORAL_TABLET | Freq: Two times a day (BID) | ORAL | Status: DC
  Administered 2015-09-14 – 2015-09-17 (×7): 100 mg via ORAL
  Filled 2015-09-13 (×10): qty 1

## 2015-09-13 MED ORDER — SODIUM CHLORIDE 0.9 % IV SOLN
INTRAVENOUS | Status: DC
  Administered 2015-09-13 – 2015-09-16 (×5): via INTRAVENOUS

## 2015-09-13 MED ORDER — SODIUM CHLORIDE 0.9 % IV BOLUS (SEPSIS)
500.0000 mL | Freq: Once | INTRAVENOUS | Status: AC
  Administered 2015-09-13: 500 mL via INTRAVENOUS

## 2015-09-13 MED ORDER — CYCLOBENZAPRINE HCL 5 MG PO TABS: 5.0000 mg | ORAL_TABLET | Freq: Three times a day (TID) | ORAL | Status: DC | PRN

## 2015-09-13 NOTE — ED Notes (Signed)
Admitting physician at bedside

## 2015-09-13 NOTE — ED Notes (Signed)
Attempted to ambulate patient with tech. Patient able to stand but unable to ambulate and not willing to try due to pain. Patient states "I am not going to be able to walk".

## 2015-09-13 NOTE — H&P (Signed)
[facility] History and Physical  Grier Czerwinski XNT:[id number] DOB: Feb 27, 1935 DOA: 09/13/2015  Referring physician:  PCP: Mauricio Po, FNP   Chief Complaint: Status post fall  HPI: Pamela Mcguire is a 80 y.o. female with a past medical history non-small cell carcinoma of lung, stage III, undergoing concurrent chemoradiation therapy at the cancer center. Patients receiving weekly carboplatin and paclitaxel with partial response. She also has a past medical history of atrial fibrillation and is anticoagulated with Eliquis. She presents to the emergency department at [facility] after having a fall in her bathroom today. She reports losing her balance then falling to the side while attempting to stand up from the commode, injuring her back in the process. Pamela Mcguire stating that she has had progressive generalized weakness over the past several months. Her daughter who is present at bedside stating that she has had a gradual decline since undergoing chemotherapy, having decreased appetite associated with a 12 pound weight loss, unsteady gait and generalized weakness. She had a fall about 6 weeks ago at which time she injured her right knee. She denies fevers, chills, nausea, vomiting, bloody stools, hematemesis, hemoptysis. Imaging studies performed in the emergency department showed L1 vertebral compression fracture having acute appearance.                                                                     Review of Systems:  Constitutional:  No weight loss, night sweats, Fevers, chills, positive for fatigue and generalized weakness.  HEENT:  No headaches, Difficulty swallowing,Tooth/dental problems,Sore throat,  No sneezing, itching, ear ache, nasal congestion, post nasal drip,  Cardio-vascular:  No chest pain, Orthopnea, PND, swelling in lower extremities, anasarca, dizziness, palpitations  GI:  No heartburn, indigestion, abdominal pain, nausea, vomiting, diarrhea, change in  bowel habits, loss of appetite  Resp:  No shortness of breath with exertion or at rest. No excess mucus, no productive cough, No non-productive cough, No coughing up of blood.No change in color of mucus.No wheezing.No chest wall deformity  Skin:  no rash or lesions.  GU:  no dysuria, change in color of urine, no urgency or frequency. No flank pain.  Musculoskeletal:  No joint pain or swelling. No decreased range of motion. No back pain.  Psych:  No change in mood or affect. No depression or anxiety. No memory loss.   Past Medical History  Diagnosis Date  . Hyperlipidemia   . Diverticulitis   . CAD (coronary artery disease)   . AF (atrial fibrillation) ([place])   . Hypertension   . Seasonal allergies   . Prediabetes   . Depression   . Shortness of breath dyspnea   . Anxiety   . Urinary frequency   . GERD (gastroesophageal reflux disease)   . Sleep apnea      dx'd 10/2014, has not received cpap yet (04/28/2015)  . Arthritis     "thighs, hips, arms" (04/28/2015)  . Breast cancer ([place])     right mastectomy  . Non-small cell carcinoma of lung, stage 3 ([place]) 04/30/2015   Past Surgical History  Procedure Laterality Date  . Cardioversion    . Tonsillectomy    . Colonoscopy    . Cesarean section  X 2  . Mastectomy Right   .  Breast surgery    . Mediastinoscopy N/A 04/28/2015    Procedure: MEDIASTINOSCOPY;  Surgeon: [name], MD;  Location: [facility] OR;  Service: Thoracic;  Laterality: N/A;   Social History:  reports that she quit smoking about 22 years ago. Her smoking use included Cigarettes. She has a 35 pack-year smoking history. She has never used smokeless tobacco. She reports that she drinks about 5.4 oz of alcohol per week. She reports that she does not use illicit drugs.  No Known Allergies  Family History She states that her parents died of old age and did not have significant medical issues, she has several of her siblings with HTN.    Prior to Admission medications     Medication Sig Start Date End Date Taking? Authorizing Provider  acetaminophen (TYLENOL) 500 MG tablet Take 500-1,000 mg by mouth every 6 (six) hours as needed for mild pain or headache.   Yes Historical Provider, MD  albuterol (PROVENTIL HFA;VENTOLIN HFA) 108 (90 BASE) MCG/ACT inhaler Inhale 1 puff into the lungs every 6 (six) hours as needed for wheezing or shortness of breath.   Yes Historical Provider, MD  buPROPion (WELLBUTRIN SR) 100 MG 12 hr tablet TAKE 1 TABLET(100 MG) BY MOUTH DAILY 08/19/15  Yes Golden Circle, FNP  carvedilol (COREG) 3.125 MG tablet Take 1 tablet (3.125 mg total) by mouth 2 (two) times daily. 08/31/15  Yes Pixie Casino, MD  Cholecalciferol (VITAMIN D PO) Take 1 tablet by mouth every morning.    Yes Historical Provider, MD  Cyanocobalamin (VITAMIN B-12 PO) Take 1 capsule by mouth every morning.   Yes Historical Provider, MD  diphenhydramine-acetaminophen (TYLENOL PM) 25-500 MG TABS tablet Take 2 tablets by mouth at bedtime as needed (sleep).   Yes Historical Provider, MD  ELIQUIS 5 MG TABS tablet TAKE 1 TABLET BY MOUTH TWICE DAILY 01/27/15  Yes Pixie Casino, MD  emollient (BIAFINE) cream Apply topically as needed. Patient taking differently: Apply 1 application topically as needed (radiation burns).  06/09/15  Yes Tyler Pita, MD  escitalopram (LEXAPRO) 10 MG tablet Take 1 tablet (10 mg total) by mouth daily. 01/06/15  Yes Pixie Casino, MD  ezetimibe-simvastatin (VYTORIN) 10-40 MG per tablet Take 1 tablet by mouth daily. 03/04/15  Yes Pixie Casino, MD  feeding supplement (BOOST / RESOURCE BREEZE) LIQD Take 1 Container by mouth 3 (three) times daily between meals. 06/26/15  Yes Debbe Odea, MD  folic acid (FOLVITE) 1 MG tablet TAKE 1 TABLET BY MOUTH TWICE DAILY 09/21/14  Yes Historical Provider, MD  HYDROcodone-acetaminophen (NORCO/VICODIN) 5-325 MG per tablet Take 1 tablet by mouth every 6 (six) hours as needed for moderate pain. 06/29/15  Yes Adrena E Johnson, Pamela-C   ibuprofen (ADVIL,MOTRIN) 200 MG tablet Take 400-600 mg by mouth every 6 (six) hours as needed for moderate pain.   Yes Historical Provider, MD  loratadine (CLARITIN) 10 MG tablet Take 10 mg by mouth at bedtime as needed for allergies.   Yes Historical Provider, MD  LORazepam (ATIVAN) 1 MG tablet TAKE 1 TABLET BY MOUTH EVERY 8 HOURS Patient taking differently: take 1 mg at bedtime, may take 1 more tablet as needed for anxiety 09/09/15  Yes Tyler Pita, MD  magnesium oxide (MAG-OX) 400 (241.3 MG) MG tablet Take 1 tablet (400 mg total) by mouth 2 (two) times daily. 06/26/15  Yes Debbe Odea, MD  mometasone-formoterol (DULERA) 100-5 MCG/ACT AERO Inhale 2 puffs into the lungs 2 (two) times daily as needed for wheezing.  Yes [name], MD  pantoprazole (PROTONIX) 40 MG tablet Take 40 mg by mouth daily.   Yes Historical Provider, MD  potassium chloride 20 MEQ TBCR Take 10 mEq by mouth daily. 06/26/15  Yes Debbe Odea, MD  sucralfate (CARAFATE) 1 G tablet Take 1 tablet (1 g total) by mouth 4 (four) times daily -  with meals and at bedtime. 5 min before meals for radiation induced esophagitis 05/21/15  Yes Tyler Pita, MD  cyclobenzaprine (FLEXERIL) 5 MG tablet Take 1 tablet (5 mg total) by mouth 3 (three) times daily as needed for muscle spasms. 09/13/15   Wandra Arthurs, MD  oxyCODONE-acetaminophen (PERCOCET) 5-325 MG tablet Take 1-2 tablets by mouth every 4 (four) hours as needed. 09/13/15   Wandra Arthurs, MD  Probiotic Product (PROBIOTIC PO) Take 1 capsule by mouth 2 (two) times daily.    Historical Provider, MD   Physical Exam: Filed Vitals:   09/13/15 1340 09/13/15 1550 09/13/15 1635  BP: 129/56  119/43  Pulse: 73 75 80  Temp: 98 F (36.7 C)    TempSrc: Oral    Resp: 22  20  SpO2: 99% 100% 100%    Wt Readings from Last 3 Encounters:  08/31/15 78.019 kg (172 lb)  08/18/15 79.379 kg (175 lb)  08/13/15 80.74 kg (178 lb)    General:  She appears calm however reports ongoing lower  back pain. Is awake and alert chronically ill-appearing Eyes: PERRL, normal lids, irises & conjunctiva ENT: grossly normal hearing, lips & tongue, dry oral mucosa Neck: no LAD, masses or thyromegaly Cardiovascular: RRR, 2/6 systolic ejection murmur. No LE edema. Telemetry: SR, no arrhythmias  Respiratory: CTA bilaterally, no w/r/r. Normal respiratory effort. Abdomen: soft, ntnd Skin: no rash or induration seen on limited exam Musculoskeletal: grossly normal tone BUE/BLE, she has pain to palpation over her lower back region Psychiatric: grossly normal mood and affect, speech fluent and appropriate Neurologic: grossly non-focal. Physical exam of lower extremity is limited due to significant back pain she experienced           Labs on Admission:  Basic Metabolic Panel:  Recent Labs Lab 09/13/15 1426  NA 138  K 4.1  CL 103  CO2 26  GLUCOSE 110*  BUN 16  CREATININE 0.75  CALCIUM 9.4   Liver Function Tests:  Recent Labs Lab 09/13/15 1426  AST 19  ALT 12*  ALKPHOS 56  BILITOT 0.7  PROT 7.7  ALBUMIN 3.4*   No results for input(s): LIPASE, AMYLASE in the last 168 hours. No results for input(s): AMMONIA in the last 168 hours. CBC:  Recent Labs Lab 09/13/15 1426  WBC 5.6  NEUTROABS 3.2  HGB 11.1*  HCT 34.2*  MCV 87.5  PLT 340   Cardiac Enzymes: No results for input(s): CKTOTAL, CKMB, CKMBINDEX, TROPONINI in the last 168 hours.  BNP (last 3 results) No results for input(s): BNP in the last 8760 hours.  ProBNP (last 3 results) No results for input(s): PROBNP in the last 8760 hours.  CBG: No results for input(s): GLUCAP in the last 168 hours.  Radiological Exams on Admission: Ct Abdomen Pelvis Wo Contrast  09/13/2015  CLINICAL DATA:  Fall off toilet 2 days ago. Abdominal and back pain. EXAM: CT ABDOMEN AND PELVIS WITHOUT CONTRAST TECHNIQUE: Multidetector CT imaging of the abdomen and pelvis was performed following the standard protocol without IV contrast.  COMPARISON:  None. FINDINGS: Lower chest:  No acute findings. Hepatobiliary: No mass visualized on this un-enhanced exam.  Prior cholecystectomy noted. No evidence of biliary dilatation. Pancreas: No mass or inflammatory process identified on this un-enhanced exam. Spleen: Within normal limits in size. Sub-cm low-attenuation lesion noted which is indeterminate but likely benign. Adrenals/Urinary Tract: No evidence of urolithiasis or hydronephrosis. 11 mm left adrenal mass is seen which is nonspecific but most likely represents a small adrenal adenoma in patient without history of cancer. Fluid attenuation right renal cyst also noted. Stomach/Bowel: Tiny hiatal hernia noted. No evidence of obstruction, inflammatory process, or abnormal fluid collections. Descending and sigmoid colon diverticulosis is demonstrated, without evidence of diverticulitis. Normal appendix visualized. Vascular/Lymphatic: No pathologically enlarged lymph nodes. No evidence of abdominal aortic aneurysm. Reproductive: No mass or other significant abnormality. Other: None. Musculoskeletal: Lumbar spine degenerative changes. Mild wedge compression fracture of the L1 vertebral body is seen which may be acute. No evidence of subluxation. IMPRESSION: Colonic diverticulosis. No radiographic evidence of diverticulitis. No evidence of abdominal aortic aneurysm or retroperitoneal hemorrhage. Mild L1 vertebral body compression fracture, which is likely acute. 11 mm indeterminate left adrenal mass, which is nonspecific but likely represents a benign adenoma and patient without history of cancer. Followup by CT recommended in 12 months to confirm stability. This recommendation follows ACR consensus guidelines: Managing Incidental Findings on Abdominal CT: White Paper of the ACR Incidental Findings Committee. [facility] [phone] Electronically Signed   By: Earle Gell M.D.   On: 09/13/2015 15:38   Ct Lumbar Spine Wo Contrast  09/13/2015   CLINICAL DATA:  Status post fall today. Low back pain. History of breast carcinoma. EXAM: CT LUMBAR SPINE WITHOUT CONTRAST TECHNIQUE: Multidetector CT imaging of the lumbar spine was performed without intravenous contrast administration. Multiplanar CT image reconstructions were also generated. COMPARISON:  PET-CT, 04/10/2015.  Chest CT, 08/04/2015 FINDINGS: There is mild depression of the upper endplate of L1, new from the recent prior chest CT, consistent with a mild compression fracture. Fracture line is seen just below the upper endplate. There is some mild adjacent edema, but no discrete hematoma. There are no other fractures. There is normal vertebral body alignment. Mild loss disc height at T12-L1. There is moderate to marked loss of disc height at L3-L4, L4-L5 and L5-S1. Endplate spurring is noted mostly from L3-L4 through L5-S1. T12-L1: No significant disc bulging. No disc herniation. There is no significant retropulsion of the fractured posterior aspect of the L1 vertebrae. Central spinal canal is well preserved as are the neural foramina. L1-L2: No significant disc bulging. No disc herniation. No significant stenosis. L2-L3: No significant disc bulging. No disc herniation. No stenosis. L3-L4: Mild diffuse disc bulging. No significant stenosis. No disc herniation. L4-L5: Mild diffuse disc bulging. No disc herniation. Mild bilateral neural foraminal narrowing. L5-S1: Diffuse disc bulging. Mild facet degenerative change. No disc herniation. Moderate to severe bilateral neural foraminal narrowing. Soft tissues show aortoiliac vascular calcifications. No soft tissue masses or adenopathy. IMPRESSION: 1. Mild compression fracture of L1 that appears recent, likely from the current fall. 2. No other acute findings.  No other fractures. 3. Degenerative changes as described. Moderate to severe bilateral neural foraminal narrowing at L5-S1 from disc bulging and endplate spurring. Electronically Signed   By: Lajean Manes M.D.   On: 09/13/2015 15:57    EKG:   Assessment/Plan Principal Problem:   Compression fracture Active Problems:   HTN (hypertension)   Adenocarcinoma of upper lobe of right lung   Fall at home   Atrial fibrillation ([place])   1. Lumbar compression fracture. Pamela  Mcguire is a 80 year old female with a past medical history of lung cancer status post concurrent chemoradiation therapy having a functional decline in the last several months. She had a fall today in the bathroom while standing or from the commode. Her daughter states that her gait has been unsteady. Will admit patient for pain management, scheduled Tylenol 1000 mg by mouth 3 times a day with oxycodone 5 mg every 4 hours as needed for breakthrough pain and morphine 2 mg IV as needed for severe breakthrough pain. Will consult physical therapy.  2. Status post fall. As mentioned above her daughter states that she has a calm progressively weaker since undergoing chemoradiation therapy several months ago and today represents a second fall she has. I discussed possible dehydration may have contributed. A CBC performed in the emergency department showed a white count of 5600. Hemoglobin was 11.1 as she did not have significant electrolyte abnormalities. Will check a urinalysis and chest x-ray. Provide IV fluids as she appears dry on physical examination. 3. Stage III non-small cell carcinoma. Patient undergoing concurrent chemoradiation therapy at the cancer center. She was treated with weekly carboplatin and paclitaxel having partial response.  4. Atrial fibrillation CHADVasc score of 3. She is anticoagulated with Eliquis. Continue Coreg 3.125 mg by mouth twice a day 5. Dyslipidemia. Continue Vytorin 6. DVT prophylaxis. Patient anticoagulated with Eliquis    Code Status: Full Code Family Communication: I spoke to her daughter who was present at bedside Disposition Plan: Will admit to the inpatient service, she may need greater than 2  nights hospitalization  Time spent: 53mn  ZKelvin Cellar[facility] Pager [phone]

## 2015-09-13 NOTE — ED Notes (Signed)
After administering Valium, patient's O2 saturation dropped to 80% on 2L via [place]. Patient encouraged to take deep breaths and patient's saturation increased to 100%.

## 2015-09-13 NOTE — ED Notes (Signed)
Patient presents to ED by EMS after having increased lower back pain from a fall on Friday. Patient fell off the toilet and did not have any LOC. Patient lives with family and has been taking Norco for pain. The patient last had a Norco 1 hour ago.

## 2015-09-13 NOTE — Discharge Instructions (Signed)
Take motrin for pain.  Take percocet instead of vicodin.   Take flexeril for muscle strain.  You may need physical therapy.  You have adrenal mass that should be followed up with your oncologist.   See your doctor for follow up.   Return to ER if you have severe pain, weakness, numbness, unable to walk.

## 2015-09-13 NOTE — ED Provider Notes (Signed)
CSN: [id number]     Arrival date & time 09/13/15  1326 History   First MD Initiated Contact with Patient 09/13/15 1410     Chief Complaint  Patient presents with  . Back Pain     (Consider location/radiation/quality/duration/timing/severity/associated sxs/prior Treatment) The history is provided by the patient.  Pamela Mcguire is a 80 y.o. female hx of HL, CAD, afib on eliquis, HTN, GERD here with fall, back pain. Just states that she was sitting on the toilet and trying to get off 2 days ago and fell into her back. Denies any head injury at that time. Dates that she has been taking her Vicodin with no relief. She has pain when she walks but denies any weakness or numbness. No incontinence. Of note, patient does have a history of lung cancer and had chemotherapy a month ago.      Past Medical History  Diagnosis Date  . Hyperlipidemia   . Diverticulitis   . CAD (coronary artery disease)   . AF (atrial fibrillation) ([place])   . Hypertension   . Seasonal allergies   . Prediabetes   . Depression   . Shortness of breath dyspnea   . Anxiety   . Urinary frequency   . GERD (gastroesophageal reflux disease)   . Sleep apnea      dx'd 10/2014, has not received cpap yet (04/28/2015)  . Arthritis     "thighs, hips, arms" (04/28/2015)  . Breast cancer ([place])     right mastectomy  . Non-small cell carcinoma of lung, stage 3 ([place]) 04/30/2015   Past Surgical History  Procedure Laterality Date  . Cardioversion    . Tonsillectomy    . Colonoscopy    . Cesarean section  X 2  . Mastectomy Right   . Breast surgery    . Mediastinoscopy N/A 04/28/2015    Procedure: MEDIASTINOSCOPY;  Surgeon: [name], MD;  Location: [facility];  Service: Thoracic;  Laterality: N/A;   History reviewed. No pertinent family history. Social History  Substance Use Topics  . Smoking status: Former Smoker -- 1.00 packs/day for 35 years    Types: Cigarettes    Quit date: 11/28/1992  . Smokeless tobacco: Never Used      Comment: "stopped smoking at age 27"  . Alcohol Use: 5.4 oz/week    9 Glasses of wine, 0 Standard drinks or equivalent per week     Comment: 04/28/2015 "drink 3, 6oz,  glasses of wine on Sat & Sun"   OB History    No data available     Review of Systems  Gastrointestinal: Positive for abdominal pain.  Musculoskeletal: Positive for back pain.  All other systems reviewed and are negative.     Allergies  Review of patient's allergies indicates no known allergies.  Home Medications   Prior to Admission medications   Medication Sig Start Date End Date Taking? Authorizing Provider  acetaminophen (TYLENOL) 500 MG tablet Take 500-1,000 mg by mouth every 6 (six) hours as needed for mild pain or headache.   Yes Historical Provider, MD  albuterol (PROVENTIL HFA;VENTOLIN HFA) 108 (90 BASE) MCG/ACT inhaler Inhale 1 puff into the lungs every 6 (six) hours as needed for wheezing or shortness of breath.   Yes Historical Provider, MD  buPROPion (WELLBUTRIN SR) 100 MG 12 hr tablet TAKE 1 TABLET(100 MG) BY MOUTH DAILY 08/19/15  Yes Golden Circle, FNP  carvedilol (COREG) 3.125 MG tablet Take 1 tablet (3.125 mg total) by mouth 2 (two) times  daily. 08/31/15  Yes Pixie Casino, MD  Cholecalciferol (VITAMIN D PO) Take 1 tablet by mouth every morning.    Yes Historical Provider, MD  Cyanocobalamin (VITAMIN B-12 PO) Take 1 capsule by mouth every morning.   Yes Historical Provider, MD  diphenhydramine-acetaminophen (TYLENOL PM) 25-500 MG TABS tablet Take 2 tablets by mouth at bedtime as needed (sleep).   Yes Historical Provider, MD  ELIQUIS 5 MG TABS tablet TAKE 1 TABLET BY MOUTH TWICE DAILY 01/27/15  Yes Pixie Casino, MD  emollient (BIAFINE) cream Apply topically as needed. Patient taking differently: Apply 1 application topically as needed (radiation burns).  06/09/15  Yes Tyler Pita, MD  escitalopram (LEXAPRO) 10 MG tablet Take 1 tablet (10 mg total) by mouth daily. 01/06/15  Yes Pixie Casino, MD   ezetimibe-simvastatin (VYTORIN) 10-40 MG per tablet Take 1 tablet by mouth daily. 03/04/15  Yes Pixie Casino, MD  feeding supplement (BOOST / RESOURCE BREEZE) LIQD Take 1 Container by mouth 3 (three) times daily between meals. 06/26/15  Yes Debbe Odea, MD  folic acid (FOLVITE) 1 MG tablet TAKE 1 TABLET BY MOUTH TWICE DAILY 09/21/14  Yes Historical Provider, MD  HYDROcodone-acetaminophen (NORCO/VICODIN) 5-325 MG per tablet Take 1 tablet by mouth every 6 (six) hours as needed for moderate pain. 06/29/15  Yes Adrena E Johnson, PA-C  ibuprofen (ADVIL,MOTRIN) 200 MG tablet Take 400-600 mg by mouth every 6 (six) hours as needed for moderate pain.   Yes Historical Provider, MD  loratadine (CLARITIN) 10 MG tablet Take 10 mg by mouth at bedtime as needed for allergies.   Yes Historical Provider, MD  LORazepam (ATIVAN) 1 MG tablet TAKE 1 TABLET BY MOUTH EVERY 8 HOURS Patient taking differently: take 1 mg at bedtime, may take 1 more tablet as needed for anxiety 09/09/15  Yes Tyler Pita, MD  magnesium oxide (MAG-OX) 400 (241.3 MG) MG tablet Take 1 tablet (400 mg total) by mouth 2 (two) times daily. 06/26/15  Yes Debbe Odea, MD  mometasone-formoterol (DULERA) 100-5 MCG/ACT AERO Inhale 2 puffs into the lungs 2 (two) times daily as needed for wheezing.    Yes [name], MD  pantoprazole (PROTONIX) 40 MG tablet Take 40 mg by mouth daily.   Yes Historical Provider, MD  potassium chloride 20 MEQ TBCR Take 10 mEq by mouth daily. 06/26/15  Yes Debbe Odea, MD  sucralfate (CARAFATE) 1 G tablet Take 1 tablet (1 g total) by mouth 4 (four) times daily -  with meals and at bedtime. 5 min before meals for radiation induced esophagitis 05/21/15  Yes Tyler Pita, MD  Probiotic Product (PROBIOTIC PO) Take 1 capsule by mouth 2 (two) times daily.    Historical Provider, MD   BP 129/56 mmHg  Pulse 75  Temp(Src) 98 F (36.7 C) (Oral)  Resp 22  SpO2 100% Physical Exam  Constitutional:  Chronically ill   HENT:   Head: Normocephalic and atraumatic.  Eyes: Conjunctivae are normal. Pupils are equal, round, and reactive to light.  Neck: Normal range of motion. Neck supple.  Cardiovascular: Normal rate, regular rhythm and normal heart sounds.   Pulmonary/Chest: Effort normal and breath sounds normal. No respiratory distress. She has no wheezes. She has no rales.  Abdominal: Soft.  Minimal mild diffuse tenderness, no rebound. L buttock with mild ecchymosis   Musculoskeletal:  + lower lumbar tenderness. No obvious stepoff or deformity   Neurological: She is alert.  No saddle anesthesia. Nl sensation and strength throughout. Nl reflexes. +  straight leg raise bilaterally   Skin: Skin is warm and dry.  Psychiatric: She has a normal mood and affect. Her behavior is normal. Judgment and thought content normal.  Nursing note and vitals reviewed.   ED Course  Procedures (including critical care time) Labs Review Labs Reviewed  CBC WITH DIFFERENTIAL/PLATELET - Abnormal; Notable for the following:    Hemoglobin 11.1 (*)    HCT 34.2 (*)    All other components within normal limits  COMPREHENSIVE METABOLIC PANEL - Abnormal; Notable for the following:    Glucose, Bld 110 (*)    Albumin 3.4 (*)    ALT 12 (*)    All other components within normal limits  PROTIME-INR - Abnormal; Notable for the following:    Prothrombin Time 15.4 (*)    All other components within normal limits  URINALYSIS, ROUTINE W REFLEX MICROSCOPIC (NOT AT [facility])    Imaging Review Ct Abdomen Pelvis Wo Contrast  09/13/2015  CLINICAL DATA:  Fall off toilet 2 days ago. Abdominal and back pain. EXAM: CT ABDOMEN AND PELVIS WITHOUT CONTRAST TECHNIQUE: Multidetector CT imaging of the abdomen and pelvis was performed following the standard protocol without IV contrast. COMPARISON:  None. FINDINGS: Lower chest:  No acute findings. Hepatobiliary: No mass visualized on this un-enhanced exam. Prior cholecystectomy noted. No evidence of biliary  dilatation. Pancreas: No mass or inflammatory process identified on this un-enhanced exam. Spleen: Within normal limits in size. Sub-cm low-attenuation lesion noted which is indeterminate but likely benign. Adrenals/Urinary Tract: No evidence of urolithiasis or hydronephrosis. 11 mm left adrenal mass is seen which is nonspecific but most likely represents a small adrenal adenoma in patient without history of cancer. Fluid attenuation right renal cyst also noted. Stomach/Bowel: Tiny hiatal hernia noted. No evidence of obstruction, inflammatory process, or abnormal fluid collections. Descending and sigmoid colon diverticulosis is demonstrated, without evidence of diverticulitis. Normal appendix visualized. Vascular/Lymphatic: No pathologically enlarged lymph nodes. No evidence of abdominal aortic aneurysm. Reproductive: No mass or other significant abnormality. Other: None. Musculoskeletal: Lumbar spine degenerative changes. Mild wedge compression fracture of the L1 vertebral body is seen which may be acute. No evidence of subluxation. IMPRESSION: Colonic diverticulosis. No radiographic evidence of diverticulitis. No evidence of abdominal aortic aneurysm or retroperitoneal hemorrhage. Mild L1 vertebral body compression fracture, which is likely acute. 11 mm indeterminate left adrenal mass, which is nonspecific but likely represents a benign adenoma and patient without history of cancer. Followup by CT recommended in 12 months to confirm stability. This recommendation follows ACR consensus guidelines: Managing Incidental Findings on Abdominal CT: White Paper of the ACR Incidental Findings Committee. [facility] [phone] Electronically Signed   By: Earle Gell M.D.   On: 09/13/2015 15:38   Ct Lumbar Spine Wo Contrast  09/13/2015  CLINICAL DATA:  Status post fall today. Low back pain. History of breast carcinoma. EXAM: CT LUMBAR SPINE WITHOUT CONTRAST TECHNIQUE: Multidetector CT imaging of the lumbar spine  was performed without intravenous contrast administration. Multiplanar CT image reconstructions were also generated. COMPARISON:  PET-CT, 04/10/2015.  Chest CT, 08/04/2015 FINDINGS: There is mild depression of the upper endplate of L1, new from the recent prior chest CT, consistent with a mild compression fracture. Fracture line is seen just below the upper endplate. There is some mild adjacent edema, but no discrete hematoma. There are no other fractures. There is normal vertebral body alignment. Mild loss disc height at T12-L1. There is moderate to marked loss of disc height at L3-L4, L4-L5 and  L5-S1. Endplate spurring is noted mostly from L3-L4 through L5-S1. T12-L1: No significant disc bulging. No disc herniation. There is no significant retropulsion of the fractured posterior aspect of the L1 vertebrae. Central spinal canal is well preserved as are the neural foramina. L1-L2: No significant disc bulging. No disc herniation. No significant stenosis. L2-L3: No significant disc bulging. No disc herniation. No stenosis. L3-L4: Mild diffuse disc bulging. No significant stenosis. No disc herniation. L4-L5: Mild diffuse disc bulging. No disc herniation. Mild bilateral neural foraminal narrowing. L5-S1: Diffuse disc bulging. Mild facet degenerative change. No disc herniation. Moderate to severe bilateral neural foraminal narrowing. Soft tissues show aortoiliac vascular calcifications. No soft tissue masses or adenopathy. IMPRESSION: 1. Mild compression fracture of L1 that appears recent, likely from the current fall. 2. No other acute findings.  No other fractures. 3. Degenerative changes as described. Moderate to severe bilateral neural foraminal narrowing at L5-S1 from disc bulging and endplate spurring. Electronically Signed   By: Lajean Manes M.D.   On: 09/13/2015 15:57   I have personally reviewed and evaluated these images and lab results as part of my medical decision-making.   EKG Interpretation None       MDM   Final diagnoses:  None   Pamela Mcguire is a 80 y.o. female here with fall, back and L hip pain. Patient on eliquis. Will get CT ab/pel and lumbar. Will give pain meds and muscle relaxants and reassess.  4:11 PM CT showed L1 fracture. No neuro deficit. Desat to 80% with valium. Now improved. Will give morphine and ambulate. If patient can ambulate can be discharged. Signed out to Dr. Ashok Cordia in the ED.       Wandra Arthurs, MD 09/13/15 [phone]

## 2015-09-13 NOTE — ED Notes (Signed)
Called floor to start 20 minute countdown. Per Network engineer, nurse on phone taking report.

## 2015-09-13 NOTE — ED Notes (Signed)
Bed: KK44 Expected date: 09/13/15 Expected time: 1:29 PM Means of arrival: Ambulance Comments: Fall on Friday, back pain

## 2015-09-13 NOTE — ED Provider Notes (Signed)
Signed out by Dr Darl Householder, that pt with acute L1 fracture , and that if unable to ambulate will need admission.  Nursing attempted ambulation, even w assist, pt unable.  Pt indicates lower back pain too severe.  At baseline family states was barely able to get around even w walker.   Dilaudid .5 mg iv.  Family also notes recent poor po intake, gen weakness. Iv ns bolus.  Pt unable to move about, stand/walk, or safely return home.   Has hx stage III lung cancer, and family indicates was told too weak for any additional tx. As of yet, hospice and/or palliative care not yet involved w pt.  Will contact hospitalist to admit.  Possibly would benefit from kyphoplasty for pain control, however is on Eliquis.  May also benefit from palliative care consult during admission.     Lajean Saver, MD 09/13/15 [phone]

## 2015-09-14 LAB — BASIC METABOLIC PANEL
ANION GAP: 10 (ref 5–15)
BUN: 14 mg/dL (ref 6–20)
CALCIUM: 8.8 mg/dL — AB (ref 8.9–10.3)
CO2: 24 mmol/L (ref 22–32)
Chloride: 104 mmol/L (ref 101–111)
Creatinine, Ser: 0.68 mg/dL (ref 0.44–1.00)
GFR calc Af Amer: >60 mL/min (ref >60)
GFR calc non Af Amer: >60 mL/min (ref >60)
GLUCOSE: 101 mg/dL — AB (ref 65–99)
POTASSIUM: 3.7 mmol/L (ref 3.5–5.1)
Sodium: 138 mmol/L (ref 135–145)

## 2015-09-14 LAB — URINALYSIS, ROUTINE W REFLEX MICROSCOPIC
Bilirubin Urine: NEGATIVE
GLUCOSE, UA: NEGATIVE mg/dL
HGB URINE DIPSTICK: NEGATIVE
Ketones, ur: NEGATIVE mg/dL
Nitrite: POSITIVE — AB
PH: 5.5 (ref 5.0–8.0)
Protein, ur: NEGATIVE mg/dL
SPECIFIC GRAVITY, URINE: 1.016 (ref 1.005–1.030)
Urobilinogen, UA: 0.2 mg/dL (ref 0.0–1.0)

## 2015-09-14 LAB — URINE MICROSCOPIC-ADD ON

## 2015-09-14 LAB — CBC
HEMATOCRIT: 30.4 % — AB (ref 36.0–46.0)
HEMOGLOBIN: 9.7 g/dL — AB (ref 12.0–15.0)
MCH: 28.6 pg (ref 26.0–34.0)
MCHC: 31.9 g/dL (ref 30.0–36.0)
MCV: 89.7 fL (ref 78.0–100.0)
Platelets: 279 10*3/uL (ref 150–400)
RBC: 3.39 MIL/uL — ABNORMAL LOW (ref 3.87–5.11)
RDW: 14.2 % (ref 11.5–15.5)
WBC: 3.1 10*3/uL — ABNORMAL LOW (ref 4.0–10.5)

## 2015-09-14 MED ORDER — SODIUM CHLORIDE 0.9 % IV BOLUS (SEPSIS)
500.0000 mL | Freq: Once | INTRAVENOUS | Status: AC
  Administered 2015-09-14: 500 mL via INTRAVENOUS

## 2015-09-14 MED ORDER — LIDOCAINE 5 % EX PTCH
1.0000 | MEDICATED_PATCH | CUTANEOUS | Status: DC
  Administered 2015-09-14 – 2015-09-17 (×4): 1 via TRANSDERMAL
  Filled 2015-09-14 (×4): qty 1

## 2015-09-14 MED ORDER — PIPERACILLIN-TAZOBACTAM 3.375 G IVPB
3.3750 g | Freq: Three times a day (TID) | INTRAVENOUS | Status: DC
  Administered 2015-09-14 – 2015-09-17 (×9): 3.375 g via INTRAVENOUS
  Filled 2015-09-14 (×11): qty 50

## 2015-09-14 MED ORDER — HYDROMORPHONE HCL 1 MG/ML IJ SOLN
1.0000 mg | INTRAMUSCULAR | Status: DC | PRN
  Administered 2015-09-14: 0.5 mg via INTRAVENOUS
  Administered 2015-09-15 (×2): 1 mg via INTRAVENOUS
  Administered 2015-09-15: 0.5 mg via INTRAVENOUS
  Administered 2015-09-15 – 2015-09-17 (×5): 1 mg via INTRAVENOUS
  Filled 2015-09-14 (×9): qty 1

## 2015-09-14 MED ORDER — VANCOMYCIN HCL 10 G IV SOLR
1250.0000 mg | Freq: Once | INTRAVENOUS | Status: AC
  Administered 2015-09-14: 1250 mg via INTRAVENOUS
  Filled 2015-09-14: qty 1250

## 2015-09-14 MED ORDER — VANCOMYCIN HCL IN DEXTROSE 1-5 GM/200ML-% IV SOLN
1000.0000 mg | Freq: Two times a day (BID) | INTRAVENOUS | Status: DC
Start: 2015-09-15 — End: 2015-09-17
  Administered 2015-09-15 – 2015-09-17 (×5): 1000 mg via INTRAVENOUS
  Filled 2015-09-14 (×5): qty 200

## 2015-09-14 MED ORDER — HYDROMORPHONE HCL 2 MG/ML IJ SOLN
2.0000 mg | INTRAMUSCULAR | Status: DC | PRN
  Administered 2015-09-14: 2 mg via INTRAVENOUS
  Filled 2015-09-14: qty 1

## 2015-09-14 MED ORDER — DEXTROSE 5 % IV SOLN
1.0000 g | INTRAVENOUS | Status: DC
  Administered 2015-09-14: 1 g via INTRAVENOUS
  Filled 2015-09-14: qty 10

## 2015-09-14 NOTE — Progress Notes (Signed)
[facility] PROGRESS NOTE  Pamela Mcguire VOH:[id number] DOB: March 01, 1935 DOA: 09/13/2015 PCP: Mauricio Po, FNP  Assessme admittednt/Plan: 1. Lumbar compression fracture.  -Patient having a fall and she was standing up from the commode in her bathroom. Imaging studies performed in the emergency department revealed an acute L1 vertebral compression fracture -Initially for pain management I had started her on scheduled Tylenol 1000 mg by mouth 3 times a day with 5 mg of oxycodone every 4 hours PRN and 2 mg of IV morphine every 4 hours as needed for breakthrough pain. -During my evaluation today she appeared to be in distress complaining of severe lower back pain. She was administered 2 mg of IV morphine with the discontinuation of IV morphine. She is now on double allotted 1 mg IV every 3 hours. Will continue scheduled Tylenol, lidocaine patch, as needed oxycodone. -Dilaudid appeared to control symptoms better. -Having a history of stage III non-small cell carcinoma I am concerned about possibility of metastatic disease. Will order MRI of lumbar spine to further assess for evidence of bone metastasis.  2.  Functional decline/recurrent falls -I think this is multifactorial with probable dehydration, malignancy, deconditioning, and infectious process all contributing factors. -Family reporting that she has had a progressive decline since starting chemoradiation therapy several months ago, having decreased by mouth intake and required greater assistance with activities of daily living. -She appeared dry on physical examination. Furthermore a chest x-ray showed possible developing pneumonia. -Will provide IV fluid resuscitation with normal saline and start empiric IV antimicrobial therapy for possible healthcare associated pneumonia. -Once symptoms better controlled will consult physical therapy -She may benefit from rehabilitation at skilled nursing facility.  3.  Suspected healthcare  associated pneumonia. -Chest x-ray performed overnight revealing increased opacity notable in the right upper lobe. This was not present on prior chest x-ray on 06/21/2015. -Although she is afebrile with white count within normal limits, I think it would be prudent to treat with broad-spectrum empiric IV antimicrobial therapy for possible healthcare associated pneumonia -On exam she is ill-appearing.   4.  Urinary tract infection. -Urine analysis showing the presence of many bacteria with moderate amount of leukocytes. -Initially she was given ceftriaxone, however, this has been changed to Zosyn as she is being empirically treated for HCAP as well. -Follow-up on urine cultures.  5.  Hypotension. -Patient becoming hypotensive this afternoon after she was given 2 mg of IV Dilantin. -Her hypotension resolving -She was given 500 mL bolus of normal saline. -I decreased her Dilantin to 1 mg IV every 3 hours.  6.  Atrial fibrillation with a CHADVasc score of 3 -Maintaining heart rates in the 70s -On carvedilol 3.125 mg by mouth twice a day -Monitor closely for hypotension -She is on chronic anticoagulation with Eliquis 5 mg by mouth twice a day  7.  Major depression. -Continue Lexapro 10 mg by mouth daily and Wellbutrin 100 mg by mouth twice a day.  Co medicine servicede Status: Full code Family Communicaion: Attempted to call her daughter over telephone conversation Disposition Plan:     Antibiotics: Zosyn (started on 09/14/2015) Vancomycin (started on 09/14/2015)  HPI/Subjective:  Pamela Mcguire is a 80 year old female with a past medical history of non-small cell carcinoma of lung admitted to the medicine service on 09/13/2015 presenting after having a fall at home. She has had failure to thrive with increasing generalized weakness over the past 2 months after initiating chemoradiation. This had been her second fall in the past month. On this last fall  she reported losing her balance  while standing up from the commode and falling to the side injuring her back in the process. Imaging studies performed in the emergency room revealed an L1 vertebral compression fracture having an acute appearance. She reported severe pain despite receiving IV narcotic analgesics in the emergency room for which she was admitted for pain management and physical therapy consultation.  Objective: Filed Vitals:   09/14/15 1801  BP: 88/58  Pulse: 76  Temp:   Resp:     Intake/Output Summary (Last 24 hours) at 09/14/15 1823 Last data filed at 09/14/15 1600  Gross per 24 hour  Intake 2143.75 ml  Output    370 ml  Net 1773.75 ml   Filed Weights   09/13/15 1822  Weight: 78.019 kg (172 lb)    Exam:   General:  Patient is in distress, reports severe pain to lower back region.  Cardiovascular: Regular rate and rhythm normal S1-S2 no murmurs rubs or gallops  Respiratory: Normal respiratory effort, lungs are clear to auscultation bilaterally  Abdomen: Soft nontender nondistended positive bowel sounds  Musculoskeletal: She has point tenderness to palpation over lumbar region.   Neurological: Patient did not have focal deficits on neurologic exam, there is no alteration to sensation to lower extremities, 5 of 5 muscle strength bilaterally. No evidence of bowel or urinary incontinence.  Data Reviewed: Basic Metabolic Panel:  Recent Labs Lab 09/13/15 1426 09/14/15 0441  NA 138 138  K 4.1 3.7  CL 103 104  CO2 26 24  GLUCOSE 110* 101*  BUN 16 14  CREATININE 0.75 0.68  CALCIUM 9.4 8.8*   Liver Function Tests:  Recent Labs Lab 09/13/15 1426  AST 19  ALT 12*  ALKPHOS 56  BILITOT 0.7  PROT 7.7  ALBUMIN 3.4*   No results for input(s): LIPASE, AMYLASE in the last 168 hours. No results for input(s): AMMONIA in the last 168 hours. CBC:  Recent Labs Lab 09/13/15 1426 09/14/15 0441  WBC 5.6 3.1*  NEUTROABS 3.2  --   HGB 11.1* 9.7*  HCT 34.2* 30.4*  MCV 87.5 89.7  PLT  340 279   Cardiac Enzymes: No results for input(s): CKTOTAL, CKMB, CKMBINDEX, TROPONINI in the last 168 hours. BNP (last 3 results) No results for input(s): BNP in the last 8760 hours.  ProBNP (last 3 results) No results for input(s): PROBNP in the last 8760 hours.  CBG: No results for input(s): GLUCAP in the last 168 hours.  No results found for this or any previous visit (from the past 240 hour(s)).   Studies: Ct Abdomen Pelvis Wo Contrast  09/13/2015  CLINICAL DATA:  Fall off toilet 2 days ago. Abdominal and back pain. EXAM: CT ABDOMEN AND PELVIS WITHOUT CONTRAST TECHNIQUE: Multidetector CT imaging of the abdomen and pelvis was performed following the standard protocol without IV contrast. COMPARISON:  None. FINDINGS: Lower chest:  No acute findings. Hepatobiliary: No mass visualized on this un-enhanced exam. Prior cholecystectomy noted. No evidence of biliary dilatation. Pancreas: No mass or inflammatory process identified on this un-enhanced exam. Spleen: Within normal limits in size. Sub-cm low-attenuation lesion noted which is indeterminate but likely benign. Adrenals/Urinary Tract: No evidence of urolithiasis or hydronephrosis. 11 mm left adrenal mass is seen which is nonspecific but most likely represents a small adrenal adenoma in patient without history of cancer. Fluid attenuation right renal cyst also noted. Stomach/Bowel: Tiny hiatal hernia noted. No evidence of obstruction, inflammatory process, or abnormal fluid collections. Descending and sigmoid colon diverticulosis  is demonstrated, without evidence of diverticulitis. Normal appendix visualized. Vascular/Lymphatic: No pathologically enlarged lymph nodes. No evidence of abdominal aortic aneurysm. Reproductive: No mass or other significant abnormality. Other: None. Musculoskeletal: Lumbar spine degenerative changes. Mild wedge compression fracture of the L1 vertebral body is seen which may be acute. No evidence of subluxation.  IMPRESSION: Colonic diverticulosis. No radiographic evidence of diverticulitis. No evidence of abdominal aortic aneurysm or retroperitoneal hemorrhage. Mild L1 vertebral body compression fracture, which is likely acute. 11 mm indeterminate left adrenal mass, which is nonspecific but likely represents a benign adenoma and patient without history of cancer. Followup by CT recommended in 12 months to confirm stability. This recommendation follows ACR consensus guidelines: Managing Incidental Findings on Abdominal CT: White Paper of the ACR Incidental Findings Committee. [facility] [phone] Electronically Signed   By: Earle Gell M.D.   On: 09/13/2015 15:38   Ct Lumbar Spine Wo Contrast  09/13/2015  CLINICAL DATA:  Status post fall today. Low back pain. History of breast carcinoma. EXAM: CT LUMBAR SPINE WITHOUT CONTRAST TECHNIQUE: Multidetector CT imaging of the lumbar spine was performed without intravenous contrast administration. Multiplanar CT image reconstructions were also generated. COMPARISON:  PET-CT, 04/10/2015.  Chest CT, 08/04/2015 FINDINGS: There is mild depression of the upper endplate of L1, new from the recent prior chest CT, consistent with a mild compression fracture. Fracture line is seen just below the upper endplate. There is some mild adjacent edema, but no discrete hematoma. There are no other fractures. There is normal vertebral body alignment. Mild loss disc height at T12-L1. There is moderate to marked loss of disc height at L3-L4, L4-L5 and L5-S1. Endplate spurring is noted mostly from L3-L4 through L5-S1. T12-L1: No significant disc bulging. No disc herniation. There is no significant retropulsion of the fractured posterior aspect of the L1 vertebrae. Central spinal canal is well preserved as are the neural foramina. L1-L2: No significant disc bulging. No disc herniation. No significant stenosis. L2-L3: No significant disc bulging. No disc herniation. No stenosis. L3-L4: Mild  diffuse disc bulging. No significant stenosis. No disc herniation. L4-L5: Mild diffuse disc bulging. No disc herniation. Mild bilateral neural foraminal narrowing. L5-S1: Diffuse disc bulging. Mild facet degenerative change. No disc herniation. Moderate to severe bilateral neural foraminal narrowing. Soft tissues show aortoiliac vascular calcifications. No soft tissue masses or adenopathy. IMPRESSION: 1. Mild compression fracture of L1 that appears recent, likely from the current fall. 2. No other acute findings.  No other fractures. 3. Degenerative changes as described. Moderate to severe bilateral neural foraminal narrowing at L5-S1 from disc bulging and endplate spurring. Electronically Signed   By: Lajean Manes M.D.   On: 09/13/2015 15:57   Dg Chest Port 1 View  09/13/2015  CLINICAL DATA:  Failure to thrive, SOB, nausea, vomiting, loss of appetite, hx of CAD, HTN, prediabetes, breast cancer, and Non-small cell carcinoma of lung, stage 3 EXAM: PORTABLE CHEST 1 VIEW COMPARISON:  Chest CT, 08/04/2015.  Chest radiographs, 06/21/2015. FINDINGS: There is increased hazy and more confluent airspace opacity in the right upper lobe when compared the prior study. There is more subtle at ill-defined nodular airspace opacity in the right lower lung. Left lung shows mild stable areas of scarring but is otherwise clear. No pleural effusion or pneumothorax. Cardiac silhouette is normal in size and configuration. There is mild prominence along the azygos region of the mediastinum. No convincing hilar masses or adenopathy. Bony thorax is demineralized but grossly intact. IMPRESSION: 1. Increased opacity most  notable in the right upper lobe. This may reflect pneumonia. Alternatively that this may reflect treatment related pneumonitis. 2. No other change. Electronically Signed   By: Lajean Manes M.D.   On: 09/13/2015 20:33    Scheduled Meds: . acetaminophen  1,000 mg Oral TID  . apixaban  5 mg Oral BID  . buPROPion  100  mg Oral BID  . carvedilol  3.125 mg Oral BID WC  . escitalopram  10 mg Oral Daily  . ezetimibe-simvastatin  1 tablet Oral Daily  . lidocaine  1 patch Transdermal Q24H  . magnesium oxide  400 mg Oral BID  . mometasone-formoterol  2 puff Inhalation BID  . pantoprazole  40 mg Oral Daily  . piperacillin-tazobactam (ZOSYN)  IV  3.375 g Intravenous 3 times per day  . senna  1 tablet Oral BID  . [START ON 09/15/2015] vancomycin  1,000 mg Intravenous Q12H   Continuous Infusions: . sodium chloride 75 mL/hr at 09/14/15 0930    Principal Problem:   Compression fracture Active Problems:   HTN (hypertension)   Adenocarcinoma of upper lobe of right lung   Fall at home   Atrial fibrillation ([place])    Time spent: 35 minutes    Kelvin Cellar  [facility] Pager [phone]. If 7PM-7AM, please contact night-coverage at [email or web address], password [facility] 09/14/2015, 6:23 PM  LOS: 1 day

## 2015-09-14 NOTE — Progress Notes (Signed)
ANTIBIOTIC CONSULT NOTE - INITIAL  Pharmacy Consult for Vancomycin Indication: pneumonia  No Known Allergies  Patient Measurements: Height: '5\' 5"'$  (165.1 cm) Weight: 172 lb (78.019 kg) IBW/kg (Calculated) : 57   Vital Signs: Temp: 97.5 F (36.4 C) (10/17 0450) Temp Source: Oral (10/17 0450) BP: 123/51 mmHg (10/17 0821) Pulse Rate: 70 (10/17 0821) Intake/Output from previous day: 10/16 0701 - 10/17 0700 In: 1053.8 [P.O.:240; I.V.:813.8] Out: 150 [Urine:150] Intake/Output from this shift: Total I/O In: -  Out: 100 [Urine:100]  Labs:  Recent Labs  09/13/15 1426 09/14/15 0441  WBC 5.6 3.1*  HGB 11.1* 9.7*  PLT 340 279  CREATININE 0.75 0.68   Estimated Creatinine Clearance: 59.8 mL/min (by C-G formula based on Cr of 0.68). No results for input(s): VANCOTROUGH, VANCOPEAK, VANCORANDOM, GENTTROUGH, GENTPEAK, GENTRANDOM, TOBRATROUGH, TOBRAPEAK, TOBRARND, AMIKACINPEAK, AMIKACINTROU, AMIKACIN in the last 72 hours.   Microbiology: No results found for this or any previous visit (from the past 720 hour(s)).  Medical History: Past Medical History  Diagnosis Date  . Hyperlipidemia   . Diverticulitis   . CAD (coronary artery disease)   . AF (atrial fibrillation) ([place])   . Hypertension   . Seasonal allergies   . Prediabetes   . Depression   . Shortness of breath dyspnea   . Anxiety   . Urinary frequency   . GERD (gastroesophageal reflux disease)   . Sleep apnea      dx'd 10/2014, has not received cpap yet (04/28/2015)  . Arthritis     "thighs, hips, arms" (04/28/2015)  . Breast cancer ([place])     right mastectomy  . Non-small cell carcinoma of lung, stage 3 ([place]) 04/30/2015     Assessment: 66 yoF with PMH of stage III non-small cell lung cancer undergoing chemoradiation, atrial fibrillation on Eliquis admitted with L1 compression fracture s/p fall.  Found to have UTI per UA, started on Ceftriaxone.  10/16 CXR shows "Increased opacity most notable in the right upper  lobe. This may reflect pneumonia. Alternatively that this may reflect treatment related pneumonitis."  Antibiotics broadened to Zosyn and vancomycin per pharmacy for HCAP.  No cultures collected.   10/16 >> Ceftriaxone >> 10/17 10/17 >> Vancomycin >> 10/17 >> Zosyn per MD >>  Tmax: afebrile WBC: 3.1 Renal: CrCl~70 ml/min (CG), ~65 ml/min (normalized) using adjusted SCr 0.8 for age  Goal of Therapy:  Vancomycin trough level 15-20 mcg/ml  Doses adjusted per renal function Eradication of infection  Plan:  1.  Vancomycin 1250 mg x 1 then 1g IV q12h. 2.  F/u SCr, trough levels as indicated, clinical course.  Hershal Coria 09/14/2015,1:47 PM

## 2015-09-14 NOTE — Care Management Note (Signed)
Case Management Note  Patient Details  Name: Pamela Mcguire MRN: [id number] Date of Birth: February 26, 1935  Subjective/Objective:80 y/o f admitted w/fall. L1 compression fx, UTI. From home.PT cons-await recommendations.                    Action/Plan:Monitor progress for d/c plans.   Expected Discharge Date:                  Expected Discharge Plan:  Skilled Nursing Facility  In-House Referral:  Clinical Social Work  Discharge planning Services  CM Consult  Post Acute Care Choice:    Choice offered to:     DME Arranged:    DME Agency:     HH Arranged:    [place] Agency:     Status of Service:  In process, will continue to follow  Medicare Important Message Given:    Date Medicare IM Given:    Medicare IM give by:    Date Additional Medicare IM Given:    Additional Medicare Important Message give by:     If discussed at [place] of Stay Meetings, dates discussed:    Additional Comments:  Dessa Phi, RN 09/14/2015, 12:00 PM

## 2015-09-14 NOTE — Progress Notes (Signed)
PT Cancellation Note  Patient Details Name: Kyra Laffey MRN: [id number] DOB: 1934-12-24   Cancelled Treatment:    Reason Eval/Treat Not Completed: Pain limiting ability to participate. Pt tearful and anxious lying in bed due to pain. Spoke with pt who reports MD told her she did not have to get OOB today. Will check back on tomorrow to attempt PT eval if able.    Weston Anna, MPT Pager: [phone]

## 2015-09-14 NOTE — Progress Notes (Signed)
Pamela Mcguire is a 80 y.o. female patient. 1. L1 vertebral fracture, closed, initial encounter   2. Adrenal mass (H[facility])   3. Weakness   4. History of lung cancer   5. Mobility impaired   6. FTT (failure to thrive) in adult    Past Medical History  Diagnosis Date  . Hyperlipidemia   . Diverticulitis   . CAD (coronary artery disease)   . AF (atrial fibrillation) ([place])   . Hypertension   . Seasonal allergies   . Prediabetes   . Depression   . Shortness of breath dyspnea   . Anxiety   . Urinary frequency   . GERD (gastroesophageal reflux disease)   . Sleep apnea      dx'd 10/2014, has not received cpap yet (04/28/2015)  . Arthritis     "thighs, hips, arms" (04/28/2015)  . Breast cancer ([place])     right mastectomy  . Non-small cell carcinoma of lung, stage 3 (H[facility]) 04/30/2015   Current Facility-Administered Medications  Medication Dose Route Frequency Provider Last Rate Last Dose  . 0.9 %  sodium chloride infusion   Intravenous Continuous Kelvin Cellar, MD 75 mL/hr at 09/13/15 1904    . acetaminophen (TYLENOL) tablet 1,000 mg  1,000 mg Oral TID Kelvin Cellar, MD   1,000 mg at 09/13/15 2106  . albuterol (PROVENTIL) (2.5 MG/3ML) 0.083% nebulizer solution 2.5 mg  2.5 mg Inhalation Q6H PRN Kelvin Cellar, MD      . alum & mag hydroxide-simeth (MAALOX/MYLANTA) 200-200-20 MG/5ML suspension 30 mL  30 mL Oral Q6H PRN Kelvin Cellar, MD      . apixaban (ELIQUIS) tablet 5 mg  5 mg Oral BID Kelvin Cellar, MD   5 mg at 09/13/15 2107  . buPROPion [facility] SR) 12 hr tablet 100 mg  100 mg Oral BID Kelvin Cellar, MD   100 mg at 09/13/15 2107  . carvedilol (COREG) tablet 3.125 mg  3.125 mg Oral BID WC Kelvin Cellar, MD   3.125 mg at 09/14/15 1696  . cefTRIAXone (ROCEPHIN) 1 g in dextrose 5 % 50 mL IVPB  1 g Intravenous Q24H Kelvin Cellar, MD      . escitalopram (LEXAPRO) tablet 10 mg  10 mg Oral Daily Kelvin Cellar, MD   10 mg at 09/13/15 2106  . ezetimibe-simvastatin (VYTORIN) 10-40 MG  per tablet 1 tablet  1 tablet Oral Daily Kelvin Cellar, MD   1 tablet at 09/13/15 2107  . LORazepam (ATIVAN) tablet 1 mg  1 mg Oral Q8H PRN Kelvin Cellar, MD   1 mg at 09/13/15 2107  . magnesium oxide (MAG-OX) tablet 400 mg  400 mg Oral BID Kelvin Cellar, MD   400 mg at 09/13/15 2107  . mometasone-formoterol (DULERA) 100-5 MCG/ACT inhaler 2 puff  2 puff Inhalation BID Kelvin Cellar, MD   2 puff at 09/14/15 0845  . morphine 2 MG/ML injection 2 mg  2 mg Intravenous Q4H PRN Kelvin Cellar, MD   2 mg at 09/14/15 0826  . ondansetron (ZOFRAN) tablet 4 mg  4 mg Oral Q6H PRN Kelvin Cellar, MD       Or  . ondansetron [facility]) injection 4 mg  4 mg Intravenous Q6H PRN Kelvin Cellar, MD   4 mg at 09/13/15 2111  . oxyCODONE (Oxy IR/ROXICODONE) immediate release tablet 5 mg  5 mg Oral Q4H PRN Kelvin Cellar, MD   5 mg at 09/13/15 2107  . pantoprazole (PROTONIX) EC tablet 40 mg  40 mg Oral Daily Kelvin Cellar, MD  40 mg at 09/13/15 2107  . senna (SENOKOT) tablet 8.6 mg  1 tablet Oral BID Kelvin Cellar, MD   8.6 mg at 09/13/15 2106   No Known Allergies Principal Problem:   Compression fracture Active Problems:   HTN (hypertension)   Adenocarcinoma of upper lobe of right lung   Fall at home   Atrial fibrillation ([place])  Blood pressure 123/51, pulse 70, temperature 97.5 F (36.4 C), temperature source Oral, resp. rate 16, height '5\' 5"'$  (1.651 m), weight 78.019 kg (172 lb), SpO2 97 %.  Subjective  This is a 80 y/o female admitted 10/16 after a fall on 10/13. Lumbar films show acute compression fracture of L1. She has been experiencing progressive weakness for several weeks now. Patient is still reporting significant pain in her lower back today but no new symptoms. Denies numbness/tingling in legs, N/V, bowel and bladder symptoms.   Objective  Vitals - WNL  Labs - Hgb 9.7, Hct 30.4, WBC 3, UA = many bacteria  PE:  -Physical exam is somewhat limited due to pain. -CV - Regular rate and  rhythm, no murmurs.  -Pulm - BS clear and equal B/L, no wheezes or rales.  -Extremities: Hematoma on left buttock. Pain on palpation over lumbar spine. Full strength and sensation in B/L lower extremities.    Assessment & Plan   1. L1 Compression Fracture  -Patient presents s/p fall with acute compression fracture of L1. -Complaining of significant pain today, change pain meds to Dilaudid 2 mg PRN q2hrs  -Plan to order MRI to assess for possible metastases in lumbar spine due to [facility] carcinoma of lung  -PT to consult for strengthening, likely will not be able to work with PT today due to pain   2. Urinary Tract Infection  -UA performed 10/16 showing many bacteria  -Hx of UTIs in the past, culture in 05/2015 showed proteus sensitive to ceftriaxone; start IV Rocephin 1 g q daily.   3. Anemia  -CBC today shows hgb 9.7, Hct 30.4 with normal indices. Past CBCs show baseline of 9-10.  -Likely anemia of chronic disease, will continue to monitor   Vornheder, Katie PA-S 09/14/2015

## 2015-09-15 ENCOUNTER — Inpatient Hospital Stay (HOSPITAL_COMMUNITY): Payer: Medicare HMO

## 2015-09-15 DIAGNOSIS — J189 Pneumonia, unspecified organism: Secondary | ICD-10-CM

## 2015-09-15 HISTORY — DX: Pneumonia, unspecified organism: J18.9

## 2015-09-15 LAB — CBC
HEMATOCRIT: 29.4 % — AB (ref 36.0–46.0)
HEMOGLOBIN: 9.2 g/dL — AB (ref 12.0–15.0)
MCH: 28.5 pg (ref 26.0–34.0)
MCHC: 31.3 g/dL (ref 30.0–36.0)
MCV: 91 fL (ref 78.0–100.0)
PLATELETS: 310 10*3/uL (ref 150–400)
RBC: 3.23 MIL/uL — AB (ref 3.87–5.11)
RDW: 14.1 % (ref 11.5–15.5)
WBC: 2.7 10*3/uL — AB (ref 4.0–10.5)

## 2015-09-15 LAB — BASIC METABOLIC PANEL
Anion gap: 3 — ABNORMAL LOW (ref 5–15)
BUN: 12 mg/dL (ref 6–20)
CHLORIDE: 107 mmol/L (ref 101–111)
CO2: 26 mmol/L (ref 22–32)
Calcium: 8.6 mg/dL — ABNORMAL LOW (ref 8.9–10.3)
Creatinine, Ser: 0.74 mg/dL (ref 0.44–1.00)
GFR calc non Af Amer: >60 mL/min (ref >60)
Glucose, Bld: 110 mg/dL — ABNORMAL HIGH (ref 65–99)
POTASSIUM: 3.9 mmol/L (ref 3.5–5.1)
SODIUM: 136 mmol/L (ref 135–145)

## 2015-09-15 MED ORDER — POLYETHYLENE GLYCOL 3350 17 G PO PACK
17.0000 g | PACK | Freq: Two times a day (BID) | ORAL | Status: DC
  Administered 2015-09-15 (×2): 17 g via ORAL

## 2015-09-15 MED ORDER — BISACODYL 10 MG RE SUPP
10.0000 mg | Freq: Once | RECTAL | Status: AC
  Administered 2015-09-15: 10 mg via RECTAL
  Filled 2015-09-15: qty 1

## 2015-09-15 MED ORDER — ENSURE ENLIVE PO LIQD
237.0000 mL | Freq: Three times a day (TID) | ORAL | Status: DC
  Administered 2015-09-15: 237 mL via ORAL

## 2015-09-15 MED ORDER — GADOBENATE DIMEGLUMINE 529 MG/ML IV SOLN
16.0000 mL | Freq: Once | INTRAVENOUS | Status: AC | PRN
  Administered 2015-09-15: 16 mL via INTRAVENOUS

## 2015-09-15 MED ORDER — LACTULOSE 10 GM/15ML PO SOLN
20.0000 g | Freq: Once | ORAL | Status: AC
  Administered 2015-09-15: 20 g via ORAL
  Filled 2015-09-15: qty 30

## 2015-09-15 NOTE — Progress Notes (Signed)
[facility] PROGRESS NOTE  Anisah Kuck FWY:[id number] DOB: January 03, 1935 DOA: 09/13/2015 PCP: Mauricio Po, FNP  Assessment/Plan: 1. Lumbar Compression Fracture:  -Pt presented after a fall with evidence of acute L1 compression fracture on CT scan.  -Had difficulties with pain management yesterday, patient tearful and in obvious distress, added Dilaudid 2 mg q2hrs for pain. Dilaudid controlled pain, however due to concerns of hypotension dose was decreased to 1 mg q3 hrs.  -Pt is still complaining of low back pain this AM after receiving two doses of Dilaudid overnight, however she is not in any apparent distress and is less ill-appearing today.  -Lumbar MRI done on 09/15/2015 did not show evidence of metastatic disease -Patient seems more able to work with PT today compared to yesterday, PT to eval today and attempt ambulation. Patient should be up adlib with assistance throughout the day if possible.  -Continue pain medication with scheduled tylenol, PRN oxycodone and Dilaudid.  2. Functional decline/recurrent falls:  -Patient has had a decline in functioning x several weeks now according to family members including two falls within the past two weeks. There are likely several contributing factors; malignancy/chemotherapy, infection, as well as general deconditioning.  -CXR yesterday showed possible R sided pneumonia and UTI apparent on UA - patient is receiving broad spectrum abx coverage with Zosyn and Vancomycin.  -On exam she appears less dehydrated today following IVF, plan to continue IVF  -PT to work with patient to improve muscle strength, it is likely that the patient will need rehab facility upon discharge.  3. Suspected Healthcare Associated Pneumonia -CXR performed 10/16 showed possible developing R pneumonia. Due to patients recent visits to [facility], suspect the possibility of HCAP.  -Patient is largely asx from a respiratory standpoint, she denies SOB, CP, fever,  chills. She does complain of a productive cough, nursing staff has noticed small amounts of "chocolate-brown" sputum.  -Oxygen saturation has remained in the high 90's on 1-2 LPM via [place]. She has remained afebrile with a WBC of 2.7 this AM. -She is being treated currently with Zosyn and Vanc - plan to continue abx and repeat CXR 10/19 AM to assess  4. Urinary Tract Infection  -UA performed 10/16 showed many bacteria - originally patient was on Ceftriaxone but this was changed to Zosyn for broad spectrum coverage of HCAP which will cover for the UTI as well.  -Urine culture is pending.  5. Hypotension  -Patient had BPs in the 60s-80s yesterday afternoon, likely due to additive effects of narcotic pain medications. She was somewhat symptomatic with dizziness. Hypotension quickly resolved with 500 mL NS bolus. Dilaudid dose decreased to 1 mg q3 hrs. -BPs overnight have remained stable in the 110-120s with no episodes of hypotension.  -Likely attributable to dilaudid, will continue to monitor BP. 6. Constipation -Patient's last recorded BM was on 10/13, no stools since admission. She is complaining of loss of appetite and some abdominal discomfort.  -Constipation is likely narcotic induced as well as her lack of ambulation.  -Ordered 20 g Lactulose, Dulcolax suppository as well as Miralax BID along with Senna BID which she was previously on.  -PT will be working with ambulation today which also will hopefully help to resolve constipation. ' -Plan to reassess later today, may consider enema if patient still unable to have BM.  7. Atrial Fibrillation w/ CHADSVASC Score of 3  -Not in AFib currently, rate maintaining in the 70s.  -Coreg 3.125 mg BID  -Anticoagulated with 5 mg Eliquis BID  8. Major Depression  -Receiving Wellbutrin 100 mg BID and Lexapro 10 mg QD.   Code Status: Full Code  Family Communication: Spoke with daughter over telephone conversation  Disposition Plan:     Antibiotics:  Zosyn 3.375 g IV Q8hrs  Vancomycin 1 gm IV Q12hrs   HPI/Subjective: Ms. Bischoff is a 80 y/o female with a history of Morris carcinoma of the lung, who presented to the ED 10/16 after a fall while standing up from the commode with significant back pain. CT scan performed at that time showed acute L1 compression fracture. Family states she has had a decline in function for several weeks now with this being the second fall in two weeks. She was admitted for pain management and physical therapy. Upon admission she was found to have a UTI as well as a developing R sided PN - she is currently being treated with broad spectrum abx.   Objective: Filed Vitals:   09/15/15 0430  BP: 115/58  Pulse: 74  Temp: 98.2 F (36.8 C)  Resp: 18    Intake/Output Summary (Last 24 hours) at 09/15/15 1054 Last data filed at 09/15/15 1000  Gross per 24 hour  Intake   2275 ml  Output    675 ml  Net   1600 ml   Filed Weights   09/13/15 1822  Weight: 78.019 kg (172 lb)    Exam:   General:  Patient is less ill-appearing today and in less distress. Still complaining of significant lower back pain.  Cardiovascular: Regular rate and rhythm, no murmur.   Respiratory: Respirations are even and unlaboured. Breath sounds clear and equal bilaterally, no wheezes.   Abdomen: Bowel sounds present in all four quadrants. Abdomen is soft, with some mild tenderness on palpation of the LLQ and RLQ.   Musculoskeletal: Full strength and sensation in B/L lower extremities. No evidence of bowel and bladder dysfunction  Data Reviewed: Basic Metabolic Panel:  Recent Labs Lab 09/13/15 1426 09/14/15 0441 09/15/15 0537  NA 138 138 136  K 4.1 3.7 3.9  CL 103 104 107  CO2 '26 24 26  '$ GLUCOSE 110* 101* 110*  BUN '16 14 12  '$ CREATININE 0.75 0.68 0.74  CALCIUM 9.4 8.8* 8.6*   Liver Function Tests:  Recent Labs Lab 09/13/15 1426  AST 19  ALT 12*  ALKPHOS 56  BILITOT 0.7  PROT 7.7  ALBUMIN 3.4*    No results for input(s): LIPASE, AMYLASE in the last 168 hours. No results for input(s): AMMONIA in the last 168 hours. CBC:  Recent Labs Lab 09/13/15 1426 09/14/15 0441 09/15/15 0537  WBC 5.6 3.1* 2.7*  NEUTROABS 3.2  --   --   HGB 11.1* 9.7* 9.2*  HCT 34.2* 30.4* 29.4*  MCV 87.5 89.7 91.0  PLT 340 279 310   Cardiac Enzymes: No results for input(s): CKTOTAL, CKMB, CKMBINDEX, TROPONINI in the last 168 hours. BNP (last 3 results) No results for input(s): BNP in the last 8760 hours.  ProBNP (last 3 results) No results for input(s): PROBNP in the last 8760 hours.  CBG: No results for input(s): GLUCAP in the last 168 hours.  Recent Results (from the past 240 hour(s))  Urine culture     Status: None (Preliminary result)   Collection Time: 09/14/15  7:50 PM  Result Value Ref Range Status   Specimen Description URINE, CLEAN CATCH  Final   Special Requests Normal  Final   Culture   Final    TOO YOUNG TO READ Performed  at [facility]    Report Status PENDING  Incomplete     Studies: Ct Abdomen Pelvis Wo Contrast  09/13/2015  CLINICAL DATA:  Fall off toilet 2 days ago. Abdominal and back pain. EXAM: CT ABDOMEN AND PELVIS WITHOUT CONTRAST TECHNIQUE: Multidetector CT imaging of the abdomen and pelvis was performed following the standard protocol without IV contrast. COMPARISON:  None. FINDINGS: Lower chest:  No acute findings. Hepatobiliary: No mass visualized on this un-enhanced exam. Prior cholecystectomy noted. No evidence of biliary dilatation. Pancreas: No mass or inflammatory process identified on this un-enhanced exam. Spleen: Within normal limits in size. Sub-cm low-attenuation lesion noted which is indeterminate but likely benign. Adrenals/Urinary Tract: No evidence of urolithiasis or hydronephrosis. 11 mm left adrenal mass is seen which is nonspecific but most likely represents a small adrenal adenoma in patient without history of cancer. Fluid attenuation right  renal cyst also noted. Stomach/Bowel: Tiny hiatal hernia noted. No evidence of obstruction, inflammatory process, or abnormal fluid collections. Descending and sigmoid colon diverticulosis is demonstrated, without evidence of diverticulitis. Normal appendix visualized. Vascular/Lymphatic: No pathologically enlarged lymph nodes. No evidence of abdominal aortic aneurysm. Reproductive: No mass or other significant abnormality. Other: None. Musculoskeletal: Lumbar spine degenerative changes. Mild wedge compression fracture of the L1 vertebral body is seen which may be acute. No evidence of subluxation. IMPRESSION: Colonic diverticulosis. No radiographic evidence of diverticulitis. No evidence of abdominal aortic aneurysm or retroperitoneal hemorrhage. Mild L1 vertebral body compression fracture, which is likely acute. 11 mm indeterminate left adrenal mass, which is nonspecific but likely represents a benign adenoma and patient without history of cancer. Followup by CT recommended in 12 months to confirm stability. This recommendation follows ACR consensus guidelines: Managing Incidental Findings on Abdominal CT: White Paper of the ACR Incidental Findings Committee. Natasha [facility] [phone] Electronically Signed   By: Earle Gell M.D.   On: 09/13/2015 15:38   Ct Lumbar Spine Wo Contrast  09/13/2015  CLINICAL DATA:  Status post fall today. Low back pain. History of breast carcinoma. EXAM: CT LUMBAR SPINE WITHOUT CONTRAST TECHNIQUE: Multidetector CT imaging of the lumbar spine was performed without intravenous contrast administration. Multiplanar CT image reconstructions were also generated. COMPARISON:  PET-CT, 04/10/2015.  Chest CT, 08/04/2015 FINDINGS: There is mild depression of the upper endplate of L1, new from the recent prior chest CT, consistent with a mild compression fracture. Fracture line is seen just below the upper endplate. There is some mild adjacent edema, but no discrete hematoma. There are no  other fractures. There is normal vertebral body alignment. Mild loss disc height at T12-L1. There is moderate to marked loss of disc height at L3-L4, L4-L5 and L5-S1. Endplate spurring is noted mostly from L3-L4 through L5-S1. T12-L1: No significant disc bulging. No disc herniation. There is no significant retropulsion of the fractured posterior aspect of the L1 vertebrae. Central spinal canal is well preserved as are the neural foramina. L1-L2: No significant disc bulging. No disc herniation. No significant stenosis. L2-L3: No significant disc bulging. No disc herniation. No stenosis. L3-L4: Mild diffuse disc bulging. No significant stenosis. No disc herniation. L4-L5: Mild diffuse disc bulging. No disc herniation. Mild bilateral neural foraminal narrowing. L5-S1: Diffuse disc bulging. Mild facet degenerative change. No disc herniation. Moderate to severe bilateral neural foraminal narrowing. Soft tissues show aortoiliac vascular calcifications. No soft tissue masses or adenopathy. IMPRESSION: 1. Mild compression fracture of L1 that appears recent, likely from the current fall. 2. No other acute findings.  No  other fractures. 3. Degenerative changes as described. Moderate to severe bilateral neural foraminal narrowing at L5-S1 from disc bulging and endplate spurring. Electronically Signed   By: Lajean Manes M.D.   On: 09/13/2015 15:57   Dg Chest Port 1 View  09/13/2015  CLINICAL DATA:  Failure to thrive, SOB, nausea, vomiting, loss of appetite, hx of CAD, HTN, prediabetes, breast cancer, and Non-small cell carcinoma of lung, stage 3 EXAM: PORTABLE CHEST 1 VIEW COMPARISON:  Chest CT, 08/04/2015.  Chest radiographs, 06/21/2015. FINDINGS: There is increased hazy and more confluent airspace opacity in the right upper lobe when compared the prior study. There is more subtle at ill-defined nodular airspace opacity in the right lower lung. Left lung shows mild stable areas of scarring but is otherwise clear. No  pleural effusion or pneumothorax. Cardiac silhouette is normal in size and configuration. There is mild prominence along the azygos region of the mediastinum. No convincing hilar masses or adenopathy. Bony thorax is demineralized but grossly intact. IMPRESSION: 1. Increased opacity most notable in the right upper lobe. This may reflect pneumonia. Alternatively that this may reflect treatment related pneumonitis. 2. No other change. Electronically Signed   By: Lajean Manes M.D.   On: 09/13/2015 20:33    Scheduled Meds: . acetaminophen  1,000 mg Oral TID  . apixaban  5 mg Oral BID  . buPROPion  100 mg Oral BID  . carvedilol  3.125 mg Oral BID WC  . escitalopram  10 mg Oral Daily  . ezetimibe-simvastatin  1 tablet Oral Daily  . lactulose  20 g Oral Once  . lidocaine  1 patch Transdermal Q24H  . magnesium oxide  400 mg Oral BID  . mometasone-formoterol  2 puff Inhalation BID  . pantoprazole  40 mg Oral Daily  . piperacillin-tazobactam (ZOSYN)  IV  3.375 g Intravenous 3 times per day  . polyethylene glycol  17 g Oral BID  . senna  1 tablet Oral BID  . vancomycin  1,000 mg Intravenous Q12H   Continuous Infusions: . sodium chloride 75 mL/hr at 09/14/15 0930    Principal Problem:   Compression fracture Active Problems:   HTN (hypertension)   Adenocarcinoma of upper lobe of right lung   Fall at home   Atrial fibrillation [facility])    Time spent: 30 minutes     Vornheder, Katie PA-S [facility] 09/15/2015, 10:54 AM  LOS: 2 days     Addendum  I personally evaluated patient on 09/15/2015 and agree with the above findings. Mrs Inocencio is a pleasant 80 year old female with a past medical history of non-small cell carcinoma lung admitted to the medicine service on 09/13/2015 after having a fall at home. CT scan showed an L1 acute compression fracture. She was admitted for pain management. Suspect that generalized weakness and recurrent falls likely multifactorial nature. Workup  during this hospitalization included a urinalysis which came back positive for UTI. A chest x-ray showed possible developing pneumonia as well. She was started on broad-spectrum IV antimicrobial therapy with vancomycin and Zosyn on 09/14/2015. On my evaluation today she appears improved. Pain symptoms better. She also seems more awake and alert, interactive. Physical therapy saw her this afternoon.  MRI of lumbar spine did not reveal evidence of metastatic disease.  She remains on broad-spectrum IV antimicrobial therapy, plan to repeat chest x-ray in a.m. Await physical therapy evaluation, suspect she will require skilled nursing facility placement. I updated her daughter over telephone conversation today.

## 2015-09-15 NOTE — Clinical Social Work Placement (Signed)
   CLINICAL SOCIAL WORK PLACEMENT  NOTE  Date:  09/15/2015  Patient Details  Name: Pamela Mcguire MRN: [id number] Date of Birth: August 02, 1935  Clinical Social Work is seeking post-discharge placement for this patient at the [place] level of care (*CSW will initial, date and re-position this form in  chart as items are completed):  Yes   Patient/family provided with [facility] Work Department's list of facilities offering this level of care within the geographic area requested by the patient (or if unable, by the patient's family).  Yes   Patient/family informed of their freedom to choose among providers that offer the needed level of care, that participate in Medicare, Medicaid or managed care program needed by the patient, have an available bed and are willing to accept the patient.  Yes   Patient/family informed of [facility]'s ownership interest in [facility] and [facility], as well as of the fact that they are under no obligation to receive care at these facilities.  PASRR submitted to EDS on 09/15/15     PASRR number received on       Existing PASRR number confirmed on       FL2 transmitted to all facilities in geographic area requested by pt/family on 09/15/15     FL2 transmitted to all facilities within larger geographic area on       Patient informed that his/her managed care company has contracts with or will negotiate with certain facilities, including the following:        Yes   Patient/family informed of bed offers received.  Patient [facility] bed [facility]     Physician recommends and patient chooses bed at      Patient to be transferred to   on  .  Patient to be transferred to facility by       Patient family notified on   of transfer.  Name of family member notified:        PHYSICIAN       Additional Comment:    _______________________________________________ Luretha Rued, LCSW   [phone] 09/15/2015, 2:05 PM

## 2015-09-15 NOTE — Clinical Social Work Note (Signed)
Clinical Social Work Assessment  Patient Details  Name: Navreet Bolda MRN: [id number] Date of Birth: Jan 25, 1935  Date of referral:  09/15/15               Reason for consult:  Discharge Planning                Permission sought to share information with:  Facility Art therapist granted to share information::  Yes, Verbal Permission Granted  Name::        Agency::     Relationship::     Contact Information:     Housing/Transportation Living arrangements for the past 2 months:  Apartment Source of Information:  Patient, Adult Children Patient Interpreter Needed:  None Criminal Activity/Legal Involvement Pertinent to Current Situation/Hospitalization:  No - Comment as needed Significant Relationships:  Adult Children Lives with:    Do you feel safe going back to the place where you live?   (ST Rehab may be needed.) Need for family participation in patient care:  Yes (Comment)  Care giving concerns:  Pt's care cannot be managed at home following hospital d/c.   Social Worker assessment / plan:  Pt hospitalized on 09/13/15 with a lumbar compression fx. CSW met with pt / daughters to assist with d/c planning. MRI is pending. PT eval is pending. Daughter's feel ST Rehab will be needed at d/c. SNF search has been initiated. Daughter's have requested [facility] for rehab. SNF has been contacted and d/c plan confirmed pending [facility] medicare authorization. Pt / family have been notified. CSW will continue to follow to assist with d/c planning.  Employment status:  Retired Nurse, adult PT Recommendations:  Not assessed at this time Information / Referral to community resources:  [place]  Patient/Family's Response to care:  Daughter's feel rehab will be needed. They will discuss this with pt.   Patient/Family's Understanding of and Emotional Response to Diagnosis, Current Treatment, and Prognosis:  Pt / daughter's are waiting  for MRI for further info regarding medical status. Pt has a hx of CA. Pt / daughter's are aware pt has a compression fx. Daughter's will discuss rehab plan with pt. Pt has been uncomfortable due to fx. Support / reassurance provided during CSW visit with pt this am. Emotional Assessment Appearance:  Appears stated age Attitude/Demeanor/Rapport:  Other (cooperative) Affect (typically observed):  Calm, Quiet Orientation:  Oriented to Self, Oriented to Place, Oriented to  Time, Oriented to Situation Alcohol / Substance use:  Not Applicable Psych involvement (Current and /or in the community):  No (Comment)  Discharge Needs  Concerns to be addressed:  Discharge Planning Concerns Readmission within the last 30 days:  No Current discharge risk:  None Barriers to Discharge:  No Barriers Identified   Luretha Rued, [place] 09/15/2015, 1:52 PM

## 2015-09-15 NOTE — Care Management Important Message (Signed)
Important Message  Patient Details  Name: Riyan Gavina MRN: [id number] Date of Birth: Apr 04, 1935   Medicare Important Message Given:  Yes-second notification given    Camillo Flaming 09/15/2015, 11:43 AMImportant Message  Patient Details  Name: Sholonda Jobst MRN: [id number] Date of Birth: 09/14/1935   Medicare Important Message Given:  Yes-second notification given    Camillo Flaming 09/15/2015, 11:42 AM

## 2015-09-15 NOTE — Evaluation (Signed)
Physical Therapy Evaluation Patient Details Name: Pamela Mcguire MRN: [id number] DOB: November 07, 1935 Today's Date: 09/15/2015   History of Present Illness  80 y.o. female hx of HL, CAD, afib on eliquis, HTN, GERD, stage 3 non small cell carcinoma of lung, anxiety admitted after fall at home and having back pain, found to have L1 compression fx  Clinical Impression  Pt admitted with above diagnosis. Pt currently with functional limitations due to the deficits listed below (see PT Problem List).  Pt will benefit from skilled PT to increase their independence and safety with mobility to allow discharge to the venue listed below.   Pt reporting increased back pain limiting mobility however assisted to recliner today.  Pt reports d/c plan likely SNF for rehab at this time.     Follow Up Recommendations SNF;Supervision/Assistance - 24 hour    Equipment Recommendations  None recommended by PT    Recommendations for Other Services       Precautions / Restrictions Precautions Precautions: Fall;Back Precaution Comments: verbal cues during mobility for back precautions - acute L1 compression fx      Mobility  Bed Mobility Overal bed mobility: Needs Assistance Bed Mobility: Sidelying to Sit;Rolling Rolling: Min guard Sidelying to sit: Min assist       General bed mobility comments: verbal cues for log roll technique, assist for trunk upright  Transfers Overall transfer level: Needs assistance Equipment used: Rolling walker (2 wheeled) Transfers: Sit to/from [employer] Transfers Sit to Stand: +2 safety/equipment;Min assist Stand pivot transfers: Min guard       General transfer comment: verbal cues for precautions, hand placement, assist to rise and steady  Ambulation/Gait Ambulation/Gait assistance:  (declined due to pain)              Stairs            Wheelchair Mobility    Modified Rankin (Stroke Patients Only)       Balance Overall balance assessment:  History of Falls                                           Pertinent Vitals/Pain Pain Assessment: 0-10 Pain Score: 9  Pain Location: back Pain Descriptors / Indicators: Sore;Aching Pain Intervention(s): Limited activity within patient's tolerance;Monitored during session;Repositioned;Patient requesting pain meds-RN notified    Home Living Family/patient expects to be discharged to:: Private residence Living Arrangements: Children   Type of Home: House Home Access: Stairs to enter   Technical brewer of Steps: 3-4 Home Layout: One level Home Equipment: Environmental consultant - 2 wheels;Cane - single point      Prior Function Level of Independence: Independent with assistive device(s)         Comments: typically uses cane at home     Hand Dominance        Extremity/Trunk Assessment               Lower Extremity Assessment: Generalized weakness         Communication   Communication: No difficulties  Cognition Arousal/Alertness: Awake/alert Behavior During Therapy: WFL for tasks assessed/performed Overall Cognitive Status: Within Functional Limits for tasks assessed                      General Comments      Exercises        Assessment/Plan    PT Assessment Patient needs continued  PT services  PT Diagnosis Acute pain;Difficulty walking   PT Problem List Decreased strength;Decreased activity tolerance;Decreased mobility;Decreased balance;Decreased knowledge of precautions;Pain  PT Treatment Interventions DME instruction;Gait training;Patient/family education;Functional mobility training;Therapeutic activities;Therapeutic exercise   PT Goals (Current goals can be found in the Care Plan section) Acute Rehab PT Goals PT Goal Formulation: With patient Time For Goal Achievement: 09/22/15 Potential to Achieve Goals: Fair    Frequency Min 3X/week   Barriers to discharge        Co-evaluation               End of Session    Activity Tolerance: Patient limited by pain Patient left: in chair;with call bell/phone within reach Nurse Communication: Mobility status         Time: 1441-1455 PT Time Calculation (min) (ACUTE ONLY): 14 min   Charges:   PT Evaluation $Initial PT Evaluation Tier I: 1 Procedure     PT G Codes:        Karne Ozga,KATHrine E 09/15/2015, 3:12 PM Carmelia Bake, PT, DPT 09/15/2015 Pager: [phone]

## 2015-09-16 ENCOUNTER — Inpatient Hospital Stay (HOSPITAL_COMMUNITY): Payer: Medicare HMO

## 2015-09-16 DIAGNOSIS — T148 Other injury of unspecified body region: Secondary | ICD-10-CM

## 2015-09-16 DIAGNOSIS — N390 Urinary tract infection, site not specified: Secondary | ICD-10-CM

## 2015-09-16 DIAGNOSIS — R5381 Other malaise: Secondary | ICD-10-CM

## 2015-09-16 DIAGNOSIS — I480 Paroxysmal atrial fibrillation: Secondary | ICD-10-CM

## 2015-09-16 DIAGNOSIS — I10 Essential (primary) hypertension: Secondary | ICD-10-CM

## 2015-09-16 DIAGNOSIS — J189 Pneumonia, unspecified organism: Secondary | ICD-10-CM

## 2015-09-16 DIAGNOSIS — C3411 Malignant neoplasm of upper lobe, right bronchus or lung: Secondary | ICD-10-CM

## 2015-09-16 LAB — BASIC METABOLIC PANEL
Anion gap: 7 (ref 5–15)
BUN: 12 mg/dL (ref 6–20)
CALCIUM: 8.6 mg/dL — AB (ref 8.9–10.3)
CO2: 25 mmol/L (ref 22–32)
Chloride: 102 mmol/L (ref 101–111)
Creatinine, Ser: 0.75 mg/dL (ref 0.44–1.00)
GFR calc Af Amer: >60 mL/min (ref >60)
GLUCOSE: 125 mg/dL — AB (ref 65–99)
Potassium: 3.8 mmol/L (ref 3.5–5.1)
Sodium: 134 mmol/L — ABNORMAL LOW (ref 135–145)

## 2015-09-16 LAB — CBC
HCT: 29.9 % — ABNORMAL LOW (ref 36.0–46.0)
HEMOGLOBIN: 9.5 g/dL — AB (ref 12.0–15.0)
MCH: 28.8 pg (ref 26.0–34.0)
MCHC: 31.8 g/dL (ref 30.0–36.0)
MCV: 90.6 fL (ref 78.0–100.0)
Platelets: 318 10*3/uL (ref 150–400)
RBC: 3.3 MIL/uL — ABNORMAL LOW (ref 3.87–5.11)
RDW: 14 % (ref 11.5–15.5)
WBC: 4.2 10*3/uL (ref 4.0–10.5)

## 2015-09-16 MED ORDER — BOOST PLUS PO LIQD
237.0000 mL | Freq: Three times a day (TID) | ORAL | Status: DC
  Administered 2015-09-16 – 2015-09-17 (×4): 237 mL via ORAL
  Filled 2015-09-16 (×5): qty 237

## 2015-09-16 NOTE — Progress Notes (Signed)
[facility] PROGRESS NOTE  Pamela Mcguire IPJ:[id number] DOB: 04-04-1935 DOA: 09/13/2015 PCP: Mauricio Po, FNP  Assessment/Plan: 1. Lumbar Compression Fracture:  -Pt presented after a fall with evidence of acute L1 compression fracture on CT scan.  -Had difficulties with pain management and has required the use of IV narcotics -Pt is still complaining of low back pain; but overall much better control and able to participate with PT. Will continue pain medication with scheduled tylenol, PRN oxycodone and Dilaudid.   2. Functional decline/recurrent falls:  -Patient has had a decline in functioning x several weeks now according to family members including two falls within the past two weeks. There are likely several contributing factors; malignancy/chemotherapy, infection, as well as general deconditioning.  -CXR repeated on 10/19 confirming R sided pneumonia and UTI apparent on UA - patient is receiving broad spectrum abx coverage with Zosyn and Vancomycin.  -PT recommending SNF  3. Suspected Healthcare Associated Pneumonia -CXR performed 10/16 showed possible developing R pneumonia. Repeat CXR on 10/19 confirming infiltrates  -will treat with IV antibiotics for 48 hours and then switch to oral antibiotics and complete a total of 8 days treatment   4. Urinary Tract Infection  -Urine culture is pending.  -but current antibiotic therapy will also cover any UTI  5. Hypotension  -BPs has remained stable in the 110-120s with no episodes of hypotension.  -Likely attributable to dilaudid, will continue to monitor BP.  6. Constipation -Patient's last recorded BM was on 10/18, no stools since admission. She is complaining of loss of appetite and some abdominal discomfort.  -Constipation is likely narcotic induced as well as her lack of ambulation.  -will use PRN miralax and colace   7. Atrial Fibrillation w/ CHADSVASC Score of 3  -Not in AFib currently, rate maintaining in the 70s.   -Coreg 3.125 mg BID  -Anticoagulated with 5 mg Eliquis BID   8. Major Depression  -Receiving Wellbutrin 100 mg BID and Lexapro 10 mg QD.  -no SI or hallucinations  Code Status: Full Code  Family Communication: Spoke with daughter over telephone conversation  Disposition Plan: will discharge to SNF in am if remains stable (10/20)   Antibiotics:  Zosyn 3.375 g IV Q8hrs  Vancomycin 1 gm IV Q12hrs   HPI/Subjective: Feeling somewhat better, complaining of back pain. Denies SOB or CP. No fever.  Objective: Filed Vitals:   09/16/15 1500  BP: 115/53  Pulse: 92  Temp: 97.5 F (36.4 C)  Resp: 16    Intake/Output Summary (Last 24 hours) at 09/16/15 1824 Last data filed at 09/16/15 1800  Gross per 24 hour  Intake   2040 ml  Output     50 ml  Net   1990 ml   Filed Weights   09/13/15 1822  Weight: 78.019 kg (172 lb)    Exam:   General:  Patient is feeling somewhat better and is in no distress. Afebrile. Still complaining of back pain  Cardiovascular: Regular rate and rhythm, no murmur.   Respiratory: Respirations are even and unlaboured. Breath sounds clear and equal bilaterally, no wheezes    Abdomen: Bowel sounds present in all four quadrants. Abdomen is soft, no guarding and no rebound  Musculoskeletal: Full strength and sensation in B/L lower extremities. No evidence of bowel and/or bladder dysfunction  Data Reviewed: Basic Metabolic Panel:  Recent Labs Lab 09/13/15 1426 09/14/15 0441 09/15/15 0537 09/16/15 0455  NA 138 138 136 134*  K 4.1 3.7 3.9 3.8  CL 103 104 107  102  CO2 '26 24 26 25  '$ GLUCOSE 110* 101* 110* 125*  BUN '16 14 12 12  '$ CREATININE 0.75 0.68 0.74 0.75  CALCIUM 9.4 8.8* 8.6* 8.6*   Liver Function Tests:  Recent Labs Lab 09/13/15 1426  AST 19  ALT 12*  ALKPHOS 56  BILITOT 0.7  PROT 7.7  ALBUMIN 3.4*   CBC:  Recent Labs Lab 09/13/15 1426 09/14/15 0441 09/15/15 0537 09/16/15 0455  WBC 5.6 3.1* 2.7* 4.2  NEUTROABS 3.2  --    --   --   HGB 11.1* 9.7* 9.2* 9.5*  HCT 34.2* 30.4* 29.4* 29.9*  MCV 87.5 89.7 91.0 90.6  PLT 340 279 310 318    CBG: No results for input(s): GLUCAP in the last 168 hours.  Recent Results (from the past 240 hour(s))  Urine culture     Status: None (Preliminary result)   Collection Time: 09/14/15  7:50 PM  Result Value Ref Range Status   Specimen Description URINE, CLEAN CATCH  Final   Special Requests Normal  Final   Culture   Final    CULTURE REINCUBATED FOR BETTER GROWTH Performed at [facility]    Report Status PENDING  Incomplete     Studies: Dg Chest 1 View  09/16/2015  CLINICAL DATA:  Pneumonia EXAM: CHEST 1 VIEW COMPARISON:  Three days ago FINDINGS: Progressive consolidation in the right upper lobe with mild volume loss today. No cavitary features or pleural effusion. Minimal opacification of the peripheral left base, chronic based on September 2016 CT. There is chronic lung disease with diffuse interstitial coarsening, likely smoking-related. Stable heart size and mediastinal contours. Right axillary dissection and mastectomy IMPRESSION: Progressive right upper lobe pneumonia. Electronically Signed   By: Monte Fantasia M.D.   On: 09/16/2015 07:29   Mr Lumbar Spine W Wo Contrast  09/15/2015  CLINICAL DATA:  Low back pain.  History of breast cancer. EXAM: MRI LUMBAR SPINE WITHOUT AND WITH CONTRAST TECHNIQUE: Multiplanar and multiecho pulse sequences of the lumbar spine were obtained without and with intravenous contrast. CONTRAST:  63m MULTIHANCE GADOBENATE DIMEGLUMINE 529 MG/ML IV SOLN COMPARISON:  None. FINDINGS: The vertebral bodies of the lumbar spine are normal in size. The vertebral bodies of the lumbar spine are normal in alignment. There is degenerative disc disease of the lumbar spine most severe at L3-4, L4-5 and L5-S1. There is mild marrow edema within the L1 vertebral body with minimal height loss consistent with a acute compression fracture with minimal  buckling of the posterior cortex. There is linear signal abnormality with minimal enhancement and edema in the right T12 vertebral body concerning for a subacute compression fracture without significant height loss. There is no other marrow signal abnormality. There no findings to suggest osseous metastatic disease of the lumbar spine. The spinal cord is normal in signal and contour. The cord terminates normally at T12-L1 . The nerve roots of the cauda equina and the filum terminale are normal. The visualized portions of the SI joints are unremarkable. The imaged intra-abdominal contents are unremarkable. T12-L1: No significant disc bulge. No evidence of neural foraminal stenosis. No central canal stenosis. L1-L2: Mild broad-based disc bulge. Mild bilateral facet arthropathy. No evidence of neural foraminal stenosis. No central canal stenosis. L2-L3: No significant disc bulge. No evidence of neural foraminal stenosis. No central canal stenosis. L3-L4: Minimal broad-based disc bulge. No evidence of neural foraminal stenosis. No central canal stenosis. L4-L5: Mild broad-based disc bulge eccentric towards the left. No evidence of neural  foraminal stenosis. No central canal stenosis. L5-S1: Mild broad-based disc osteophyte complex. Moderate bilateral foraminal narrowing. No central canal stenosis. IMPRESSION: 1. Acute compression fracture of the L1 vertebral body with minimal height loss. 2. Acute or subacute T12 vertebral body compression fracture without significant height loss along the right lateral aspect. 3. Lumbar spine spondylosis. Electronically Signed   By: Kathreen Devoid   On: 09/15/2015 13:31    Scheduled Meds: . acetaminophen  1,000 mg Oral TID  . apixaban  5 mg Oral BID  . buPROPion  100 mg Oral BID  . carvedilol  3.125 mg Oral BID WC  . escitalopram  10 mg Oral Daily  . ezetimibe-simvastatin  1 tablet Oral Daily  . lactose free nutrition  237 mL Oral TID WC  . lidocaine  1 patch Transdermal Q24H   . magnesium oxide  400 mg Oral BID  . mometasone-formoterol  2 puff Inhalation BID  . pantoprazole  40 mg Oral Daily  . piperacillin-tazobactam (ZOSYN)  IV  3.375 g Intravenous 3 times per day  . polyethylene glycol  17 g Oral BID  . senna  1 tablet Oral BID  . vancomycin  1,000 mg Intravenous Q12H   Continuous Infusions: . sodium chloride 75 mL/hr at 09/16/15 0740    Principal Problem:   Compression fracture Active Problems:   HTN (hypertension)   Adenocarcinoma of upper lobe of right lung   Fall at home   Atrial fibrillation ([place])   HCAP (healthcare-associated pneumonia)    Time spent: 57 minutes     Barton Dubois MD [facility] [phone]  09/16/2015, 6:24 PM  LOS: 3 days

## 2015-09-16 NOTE — Progress Notes (Signed)
ANTIBIOTIC CONSULT NOTE - FOLLOW UP  Pharmacy Consult for Vancomycin Indication: rule out pneumonia  No Known Allergies  Patient Measurements: Height: '5\' 5"'$  (165.1 cm) Weight: 172 lb (78.019 kg) IBW/kg (Calculated) : 57  Vital Signs: Temp: 98.3 F (36.8 C) (10/19 0739) Temp Source: Oral (10/19 0739) BP: 145/76 mmHg (10/19 0739) Pulse Rate: 95 (10/19 0739) Intake/Output from previous day: 10/18 0701 - 10/19 0700 In: 1828.8 [P.O.:1020; I.V.:508.8; IV Piggyback:300] Out: 150 [Urine:100; Stool:50]  Labs:  Recent Labs  09/14/15 0441 09/15/15 0537 09/16/15 0455  WBC 3.1* 2.7* 4.2  HGB 9.7* 9.2* 9.5*  PLT 279 310 318  CREATININE 0.68 0.74 0.75   Estimated Creatinine Clearance: 59.8 mL/min (by C-G formula based on Cr of 0.75). No results for input(s): VANCOTROUGH, VANCOPEAK, VANCORANDOM, GENTTROUGH, GENTPEAK, GENTRANDOM, TOBRATROUGH, TOBRAPEAK, TOBRARND, AMIKACINPEAK, AMIKACINTROU, AMIKACIN in the last 72 hours.   Assessment: 71 yoF with PMH of stage III non-small cell lung cancer undergoing chemoradiation, atrial fibrillation on Eliquis admitted with L1 compression fracture s/p fall. Found to have UTI per UA, started on Ceftriaxone. 10/16 CXR shows possible pneumonia or treatment related pneumonitis. Antibiotics were broadened to Zosyn and vancomycin per pharmacy for HCAP.  10/16 >> Ceftriaxone >> 10/17 10/17 >> Vancomycin >> 10/17 >> Zosyn >>  Today, 09/16/2015:  Afebrile  WBC increased, now WNL at 4.2  SCr 0.75 with CrCl ~ 60 ml/min  CXR (10/19) with progressive RUL pneumonia  Vancomycin trough level pending collection  Goal of Therapy:  Vancomycin trough level 15-20 mcg/ml  Plan:   Continue Zosyn 3.375g IV Q8H infused over 4hrs.   Continue Vancomycin 1g IV q12h.  Measure Vanc trough at steady state.  Follow up renal fxn, culture results, and clinical course.   Gretta Arab PharmD, BCPS Pager [phone] 09/16/2015 10:46 AM

## 2015-09-16 NOTE — Progress Notes (Addendum)
Physical Therapy Treatment Patient Details Name: Pamela Mcguire MRN: [id number] DOB: 08-May-1935 Today's Date: 09/16/2015    History of Present Illness 80 y.o. female hx of HL, CAD, afib on eliquis, HTN, GERD, stage 3 non small cell carcinoma of lung, anxiety admitted after fall at home and having back pain, found to have L1 compression fx    PT Comments    Progressing, continue to recommend SNF post acute  Follow Up Recommendations  SNF;Supervision/Assistance - 24 hour     Equipment Recommendations  None recommended by PT    Recommendations for Other Services       Precautions / Restrictions Precautions Precautions: Fall;Back Precaution Comments: verbal cues during mobility for back precautions - acute L1 compression fx Restrictions Weight Bearing Restrictions: No    Mobility  Bed Mobility Overal bed mobility: Needs Assistance;+ 2 for safety/equipment Bed Mobility: Rolling;Sidelying to Sit Rolling: Min guard Sidelying to sit: +2 for safety/equipment;Min assist       General bed mobility comments: verbal cues for log roll technique, assist for trunk upright adn bring LEs off bed  Transfers Overall transfer level: Needs assistance Equipment used: Rolling walker (2 wheeled) Transfers: Sit to/from Stand Sit to Stand: Min assist;+2 safety/equipment         General transfer comment: verbal cues for precautions, hand placement, assist to rise and steady  Ambulation/Gait Ambulation/Gait assistance: Min assist;+2 safety/equipment Ambulation Distance (Feet): 15 Feet Assistive device: Rolling walker (2 wheeled) Gait Pattern/deviations: Step-to pattern;Decreased step length - right;Decreased step length - left;Trunk flexed Gait velocity: decr   General Gait Details: cues for breathing, RW position and step length After amb above distance on RA pt O2 sats 86%; O2 replaced at [employer]            Wheelchair Mobility    Modified Rankin (Stroke Patients Only)       Balance Overall balance assessment: History of Falls;Needs assistance         Standing balance support: Bilateral upper extremity supported Standing balance-Leahy Scale: Poor                      Cognition Arousal/Alertness: Awake/alert Behavior During Therapy: Flat affect Overall Cognitive Status: No family/caregiver present to determine baseline cognitive functioning Area of Impairment: Following commands       Following Commands: Follows one step commands with increased time     Problem Solving: Slow processing;Decreased initiation;Difficulty sequencing;Requires verbal cues;Requires tactile cues      Exercises General Exercises - Lower Extremity Ankle Circles/Pumps: AROM;Both;10 reps    General Comments        Pertinent Vitals/Pain Pain Assessment: Faces Faces Pain Scale: Hurts worst Pain Location: back Pain Descriptors / Indicators: Grimacing;Guarding Pain Intervention(s): Limited activity within patient's tolerance;Monitored during session;Patient requesting pain meds-RN notified    Home Living                      Prior Function            PT Goals (current goals can now be found in the care plan section) Acute Rehab PT Goals Patient Stated Goal: none stated Time For Goal Achievement: 09/22/15 Potential to Achieve Goals: Fair Progress towards PT goals: Progressing toward goals    Frequency  Min 3X/week    PT Plan Current plan remains appropriate    Co-evaluation             End of Session   Activity Tolerance: Patient limited  by pain Patient left: in chair;with call bell/phone within reach;with chair alarm set     Time: 7573-2256 PT Time Calculation (min) (ACUTE ONLY): 20 min  Charges:  $Gait Training: 8-22 mins                    G Codes:      Koran Seabrook Oct 02, 2015, 11:53 AM

## 2015-09-16 NOTE — Progress Notes (Signed)
Initial Nutrition Assessment  DOCUMENTATION CODES:   Not applicable  INTERVENTION:  - Will change Ensure Enlive order to Boost Plus TID, each supplement provides 360 kcal and 14 grams of protein - Encourage PO intake of meals and snacks - RD will continue to monitor for needs  NUTRITION DIAGNOSIS:   Inadequate oral intake related to poor appetite as evidenced by per patient/family report.  GOAL:   Patient will meet greater than or equal to 90% of their needs  MONITOR:   PO intake, Supplement acceptance, Weight trends, Labs, Skin, I & O's  REASON FOR ASSESSMENT:   Consult Poor PO  ASSESSMENT:   80 y.o. female with a past medical history non-small cell carcinoma of lung, stage III, undergoing concurrent chemoradiation therapy at the cancer center. Patients receiving weekly carboplatin and paclitaxel with partial response. She also has a past medical history of atrial fibrillation and is anticoagulated with Eliquis. She presents to the emergency department at [facility] after having a fall in her bathroom today. She reports losing her balance then falling to the side while attempting to stand up from the commode, injuring her back in the process. Mrs Dede stating that she has had progressive generalized weakness over the past several months. Her daughter who is present at bedside stating that she has had a gradual decline since undergoing chemotherapy, having decreased appetite associated with a 12 pound weight loss, unsteady gait and generalized weakness. She had a fall about 6 weeks ago at which time she injured her right knee.  Pt seen for consult. BMI indicates overweight status. Breakfast tray on bedside table untouched. Pt reports she is not feeling hungry this AM but denies abdominal pain or nausea. She states that poor appetite has been ongoing for several months. She states that she was having slight taste alteration while on chemo but unable to give further detail  on this. She also states since initiation of chemo she has had some swallowing difficulty but she is unable to give examples of what she has difficulty swallowing. RN gave pt several pills which pt swallowed well and consumed a full cup of orange juice while taking pills.  Per chart review, pt ate 5% breakfast and 50% lunch yesterday (10/18). She states she was drinking Boost at home and she is interested in having this supplement during hospitalization. No muscle or fat wasting noted. Per chart review, pt has lost 6 lbs (3% body weight) in the past 1 month which is not significant for time frame.  Likely not meeting needs at this time. Medications reviewed. Labs reviewed. Na: 134 mmol/L, Ca: 8.6 mg/dL.   Diet Order:  Diet regular Room service appropriate?: Yes; Fluid consistency:: Thin  Skin:  Wound (see comment) (Chest incision from 04/28/15)  Last BM:  10/19  Height:   Ht Readings from Last 1 Encounters:  09/13/15 '5\' 5"'$  (1.651 m)    Weight:   Wt Readings from Last 1 Encounters:  09/13/15 172 lb (78.019 kg)    Ideal Body Weight:  56.82 kg (kg)  BMI:  Body mass index is 28.62 kg/(m^2).  Estimated Nutritional Needs:   Kcal:  2200-2400  Protein:  75-85 grams  Fluid:  2.2 L/day  EDUCATION NEEDS:   No education needs identified at this time     Jarome Matin, RD, LDN Inpatient Clinical Dietitian Pager # [phone] After hours/weekend pager # [phone]

## 2015-09-16 NOTE — Progress Notes (Signed)
CSW is assisting with d/c planning to SNF. [facility] and can accept pt when she is stable for d/c.  Werner Lean LCSW [phone]

## 2015-09-17 DIAGNOSIS — N390 Urinary tract infection, site not specified: Secondary | ICD-10-CM | POA: Insufficient documentation

## 2015-09-17 DIAGNOSIS — I482 Chronic atrial fibrillation: Secondary | ICD-10-CM

## 2015-09-17 DIAGNOSIS — R531 Weakness: Secondary | ICD-10-CM

## 2015-09-17 LAB — URINE CULTURE
Culture: 10000
SPECIAL REQUESTS: NORMAL

## 2015-09-17 LAB — VANCOMYCIN, TROUGH: VANCOMYCIN TR: 23 ug/mL — AB (ref 10.0–20.0)

## 2015-09-17 MED ORDER — LIDOCAINE 5 % EX PTCH: 1.0000 | MEDICATED_PATCH | CUTANEOUS | Status: DC

## 2015-09-17 MED ORDER — LEVOFLOXACIN 750 MG PO TABS: 750.0000 mg | ORAL_TABLET | Freq: Every day | ORAL | Status: AC

## 2015-09-17 MED ORDER — HYDROMORPHONE HCL 2 MG PO TABS: 2.0000 mg | ORAL_TABLET | Freq: Three times a day (TID) | ORAL | Status: DC | PRN

## 2015-09-17 MED ORDER — LORAZEPAM 1 MG PO TABS: 1.0000 mg | ORAL_TABLET | Freq: Two times a day (BID) | ORAL | Status: DC | PRN

## 2015-09-17 MED ORDER — CYCLOBENZAPRINE HCL 5 MG PO TABS: 5.0000 mg | ORAL_TABLET | Freq: Three times a day (TID) | ORAL | Status: DC | PRN

## 2015-09-17 MED ORDER — ACETAMINOPHEN 500 MG PO TABS: 1000.0000 mg | ORAL_TABLET | Freq: Three times a day (TID) | ORAL | Status: DC

## 2015-09-17 MED ORDER — OXYCODONE HCL 5 MG PO TABS: 5.0000 mg | ORAL_TABLET | ORAL | Status: DC | PRN

## 2015-09-17 MED ORDER — POLYETHYLENE GLYCOL 3350 17 G PO PACK: 17.0000 g | PACK | Freq: Every day | ORAL | Status: DC | PRN

## 2015-09-17 MED ORDER — VANCOMYCIN HCL IN DEXTROSE 750-5 MG/150ML-% IV SOLN
750.0000 mg | Freq: Two times a day (BID) | INTRAVENOUS | Status: DC
  Filled 2015-09-17: qty 150

## 2015-09-17 NOTE — Progress Notes (Signed)
Patient is alert and has intermittent confusion at times. VS stable. No s/s of acute distress. Patient being discharged to SNF. Report called to [facility]. Patient being taken via [facility].

## 2015-09-17 NOTE — Discharge Summary (Signed)
Physician Discharge Summary  Pamela Mcguire EXH:[id number] DOB: 07-Jul-1935 DOA: 09/13/2015  PCP: Mauricio Po, FNP  Admit date: 09/13/2015 Discharge date: 09/17/2015  Time spent: 40 minutes  Recommendations for Outpatient Follow-up:  Encourage good nutrition and use Boost as feeding supplements  Please reassess BMET in 1 week to follow electrolytes and renal function Physical rehabilitation as per facility protocol Check CBC in 1 week to follow Hgb trend Please be aware that antibiotic therapy is intended for only 6 more days  Discharge Diagnoses:  Principal Problem:   Compression fracture Active Problems:   HTN (hypertension)   Adenocarcinoma of upper lobe of right lung   Fall at home   Atrial fibrillation ([place])   HCAP (healthcare-associated pneumonia) E. Coli UTI Constipation.  Discharge Condition: stable and improved. Pain controlled with current pain medication regimen. Will discharge for rehabilitation to SNF and patient will follow up with PCP after discharge from SNF  Diet recommendation: heart healthy diet  Filed Weights   09/13/15 1822  Weight: 78.019 kg (172 lb)    History of present illness:  80 y.o. female with a past medical history non-small cell carcinoma of lung, stage III, undergoing concurrent chemoradiation therapy at the cancer center. Patients receiving weekly carboplatin and paclitaxel with partial response. She also has a past medical history of atrial fibrillation and is anticoagulated with Eliquis. She presents to the emergency department at [facility] after having a fall in her bathroom today. She reports losing her balance then falling to the side while attempting to stand up from the commode, injuring her back in the process. Pamela Mcguire stating that she has had progressive generalized weakness over the past several months. Her daughter who is present at bedside stating that she has had a gradual decline since undergoing chemotherapy, having  decreased appetite associated with a 12 pound weight loss, unsteady gait and generalized weakness.   Hospital Course:   Lumbar Compression Fracture: -Pt presented after a fall with evidence of acute L1 compression fracture on CT scan.  -Had difficulties with pain management and has required the use of IV narcotics initially  -Pt is still complaining of low back pain; but overall much better control and able to participate with PT. -Will continue pain medication with scheduled tylenol, PRN oxycodone for moderate pain and breakthrough and Dilaudid for severe pain.   2. Functional decline/recurrent falls: -Patient has had a decline in functioning x several weeks now according to family members including two falls within the past two weeks. There are likely several contributing factors; malignancy/chemotherapy, infection (UTI & PNA), as well as general deconditioning.  -CXR repeated on 10/19 confirming R sided pneumonia and UTI apparent on UA - patient is receiving broad spectrum abx coverage with Zosyn and Vancomycin and will complete antibiotic therapy by mouth at discharge.  -PT recommending SNF  3. Suspected Healthcare Associated Pneumonia -CXR performed 10/16 showed possible developing R pneumonia. Repeat CXR on 10/19 confirming infiltrates  -received 3 days of IV antibiotics (vanc and zosyn); then discharge on levaquin daily to complete a total of 8 days of antibiotics -encourage to continue using flutter valve -no fever and denying SOB at discharge   4. E. COli Urinary Tract Infection -patient received 3 days of IV antibiotics -will be discharge on levaquin for PNA  -no dysuria -advise to maintain good hydration (in order to have good urine output)  5. Hypotension -BPs has remained stable in the 110-120s with no episodes of hypotension.  -Likely attributable to dilaudid (IV).  6. Constipation -will use PRN miralax  -encourage to maintain good hydration    7. Atrial Fibrillation w/ CHADSVASC Score of 3 -rate maintaining in the 70s.  -Continue coreg 3.125 mg BID  -Anticoagulated with 5 mg Eliquis BID   8.  Major Depression -Receiving Wellbutrin 100 mg BID and Lexapro 10 mg QD.  -no SI or hallucinations    9.  GERD -continue Carafate and PPI   10. Moderate protein calorie malnutrition: associated with chronic illness/malignancy and chemotherapy -will discharge on Boost TID -encourage to eat   Procedures: See below for x-ray reports   Consultations:  None   Discharge Exam: Filed Vitals:   09/17/15 0914  BP:   Pulse: 92  Temp: 98 F (36.7 C)  Resp:     General: Patient is feeling much better and is in no distress. Afebrile. Still complaining of back pain, even pain is control with oral analgesic regimen  Cardiovascular: Regular rate and rhythm, no murmur.   Respiratory: Respirations are even and unlaboured. Breath sounds clear and equal bilaterally, no wheezes   Abdomen: Bowel sounds present in all four quadrants. Abdomen is soft, no guarding and no rebound  Musculoskeletal: Full strength and sensation in B/L lower extremities. No evidence of bowel and/or bladder dysfunction   Discharge Instructions   Discharge Instructions    Diet - low sodium heart healthy    Complete by:  As directed      Discharge instructions    Complete by:  As directed   Encourage good nutrition and use Boost as feeding supplements  Please reassess BMET in 1 week to follow electrolytes and renal function Physical rehabilitation as per facility protocol Check CBC in 1 week to follow Hgb trend Please be aware that antibiotic therapy is intended for only 6 more days Maintain adequate hydration     Increase activity slowly    Complete by:  As directed           Current Discharge Medication List    START taking these medications   Details  cyclobenzaprine (FLEXERIL) 5 MG tablet Take 1 tablet (5 mg total) by mouth 3  (three) times daily as needed for muscle spasms. Qty: 10 tablet, Refills: 0    HYDROmorphone (DILAUDID) 2 MG tablet Take 1 tablet (2 mg total) by mouth every 8 (eight) hours as needed for severe pain. Qty: 20 tablet, Refills: 0    levofloxacin (LEVAQUIN) 750 MG tablet Take 1 tablet (750 mg total) by mouth daily. To complete 6 more days Refills: 0    lidocaine (LIDODERM) 5 % Place 1 patch onto the skin daily. Apply to lower back; Remove & Discard patch every 12 hours Qty: 30 patch, Refills: 0    oxyCODONE (OXY IR/ROXICODONE) 5 MG immediate release tablet Take 1-2 tablets (5-10 mg total) by mouth every 4 (four) hours as needed for moderate pain. Qty: 30 tablet, Refills: 0    polyethylene glycol (MIRALAX / GLYCOLAX) packet Take 17 g by mouth daily as needed for mild constipation. Qty: 14 each, Refills: 0      CONTINUE these medications which have CHANGED   Details  acetaminophen (TYLENOL) 500 MG tablet Take 2 tablets (1,000 mg total) by mouth every 8 (eight) hours. Qty: 30 tablet, Refills: 0    LORazepam (ATIVAN) 1 MG tablet Take 1 tablet (1 mg total) by mouth every 12 (twelve) hours as needed for anxiety. Qty: 20 tablet, Refills: 0      CONTINUE these medications which have NOT  CHANGED   Details  albuterol (PROVENTIL HFA;VENTOLIN HFA) 108 (90 BASE) MCG/ACT inhaler Inhale 1 puff into the lungs every 6 (six) hours as needed for wheezing or shortness of breath.   Associated Diagnoses: Lung mass    buPROPion (WELLBUTRIN SR) 100 MG 12 hr tablet TAKE 1 TABLET(100 MG) BY MOUTH DAILY Qty: 90 tablet, Refills: 0    carvedilol (COREG) 3.125 MG tablet Take 1 tablet (3.125 mg total) by mouth 2 (two) times daily. Qty: 180 tablet, Refills: 3    Cholecalciferol (VITAMIN D PO) Take 1 tablet by mouth every morning.     Cyanocobalamin (VITAMIN B-12 PO) Take 1 capsule by mouth every morning.    ELIQUIS 5 MG TABS tablet TAKE 1 TABLET BY MOUTH TWICE DAILY Qty: 60 tablet, Refills: 5    emollient  (BIAFINE) cream Apply topically as needed. Qty: 454 g, Refills: 0   Associated Diagnoses: Cancer of upper lobe of right lung (HCC)    escitalopram (LEXAPRO) 10 MG tablet Take 1 tablet (10 mg total) by mouth daily. Qty: 30 tablet, Refills: 5    ezetimibe-simvastatin (VYTORIN) 10-40 MG per tablet Take 1 tablet by mouth daily. Qty: 90 tablet, Refills: 3    feeding supplement (BOOST / RESOURCE BREEZE) LIQD Take 1 Container by mouth 3 (three) times daily between meals. Qty: 90 Container, Refills: 0    folic acid (FOLVITE) 1 MG tablet TAKE 1 TABLET BY MOUTH TWICE DAILY   Associated Diagnoses: Cancer of upper lobe of right lung (HCC)    loratadine (CLARITIN) 10 MG tablet Take 10 mg by mouth at bedtime as needed for allergies.    magnesium oxide (MAG-OX) 400 (241.3 MG) MG tablet Take 1 tablet (400 mg total) by mouth 2 (two) times daily. Qty: 60 tablet, Refills: 0    mometasone-formoterol (DULERA) 100-5 MCG/ACT AERO Inhale 2 puffs into the lungs 2 (two) times daily as needed for wheezing.    Associated Diagnoses: Lung mass    pantoprazole (PROTONIX) 40 MG tablet Take 40 mg by mouth daily.    potassium chloride 20 MEQ TBCR Take 10 mEq by mouth daily. Qty: 30 tablet, Refills: 0    sucralfate (CARAFATE) 1 G tablet Take 1 tablet (1 g total) by mouth 4 (four) times daily -  with meals and at bedtime. 5 min before meals for radiation induced esophagitis Qty: 120 tablet, Refills: 2   Associated Diagnoses: Cancer of upper lobe of right lung (HCC)      STOP taking these medications     diphenhydramine-acetaminophen (TYLENOL PM) 25-500 MG TABS tablet      HYDROcodone-acetaminophen (NORCO/VICODIN) 5-325 MG per tablet      ibuprofen (ADVIL,MOTRIN) 200 MG tablet      Probiotic Product (PROBIOTIC PO)        No Known Allergies Follow-up Information    Schedule an appointment as soon as possible for a visit with Mauricio Po, [place].   Specialty:  Family Medicine   Why:  2 weeks after  discharge from SNF   Contact information:   [address] [place] [address] [phone]       The results of significant diagnostics from this hospitalization (including imaging, microbiology, ancillary and laboratory) are listed below for reference.    Significant Diagnostic Studies: Ct Abdomen Pelvis Wo Contrast  09/13/2015  CLINICAL DATA:  Fall off toilet 2 days ago. Abdominal and back pain. EXAM: CT ABDOMEN AND PELVIS WITHOUT CONTRAST TECHNIQUE: Multidetector CT imaging of the abdomen and pelvis was performed  following the standard protocol without IV contrast. COMPARISON:  None. FINDINGS: Lower chest:  No acute findings. Hepatobiliary: No mass visualized on this un-enhanced exam. Prior cholecystectomy noted. No evidence of biliary dilatation. Pancreas: No mass or inflammatory process identified on this un-enhanced exam. Spleen: Within normal limits in size. Sub-cm low-attenuation lesion noted which is indeterminate but likely benign. Adrenals/Urinary Tract: No evidence of urolithiasis or hydronephrosis. 11 mm left adrenal mass is seen which is nonspecific but most likely represents a small adrenal adenoma in patient without history of cancer. Fluid attenuation right renal cyst also noted. Stomach/Bowel: Tiny hiatal hernia noted. No evidence of obstruction, inflammatory process, or abnormal fluid collections. Descending and sigmoid colon diverticulosis is demonstrated, without evidence of diverticulitis. Normal appendix visualized. Vascular/Lymphatic: No pathologically enlarged lymph nodes. No evidence of abdominal aortic aneurysm. Reproductive: No mass or other significant abnormality. Other: None. Musculoskeletal: Lumbar spine degenerative changes. Mild wedge compression fracture of the L1 vertebral body is seen which may be acute. No evidence of subluxation. IMPRESSION: Colonic diverticulosis. No radiographic evidence of diverticulitis. No evidence of abdominal aortic aneurysm or  retroperitoneal hemorrhage. Mild L1 vertebral body compression fracture, which is likely acute. 11 mm indeterminate left adrenal mass, which is nonspecific but likely represents a benign adenoma and patient without history of cancer. Followup by CT recommended in 12 months to confirm stability. This recommendation follows ACR consensus guidelines: Managing Incidental Findings on Abdominal CT: White Paper of the ACR Incidental Findings Committee. [facility] [phone] Electronically Signed   By: Earle Gell M.D.   On: 09/13/2015 15:38   Dg Chest 1 View  09/16/2015  CLINICAL DATA:  Pneumonia EXAM: CHEST 1 VIEW COMPARISON:  Three days ago FINDINGS: Progressive consolidation in the right upper lobe with mild volume loss today. No cavitary features or pleural effusion. Minimal opacification of the peripheral left base, chronic based on September 2016 CT. There is chronic lung disease with diffuse interstitial coarsening, likely smoking-related. Stable heart size and mediastinal contours. Right axillary dissection and mastectomy IMPRESSION: Progressive right upper lobe pneumonia. Electronically Signed   By: Monte Fantasia M.D.   On: 09/16/2015 07:29   Ct Lumbar Spine Wo Contrast  09/13/2015  CLINICAL DATA:  Status post fall today. Low back pain. History of breast carcinoma. EXAM: CT LUMBAR SPINE WITHOUT CONTRAST TECHNIQUE: Multidetector CT imaging of the lumbar spine was performed without intravenous contrast administration. Multiplanar CT image reconstructions were also generated. COMPARISON:  PET-CT, 04/10/2015.  Chest CT, 08/04/2015 FINDINGS: There is mild depression of the upper endplate of L1, new from the recent prior chest CT, consistent with a mild compression fracture. Fracture line is seen just below the upper endplate. There is some mild adjacent edema, but no discrete hematoma. There are no other fractures. There is normal vertebral body alignment. Mild loss disc height at T12-L1. There is  moderate to marked loss of disc height at L3-L4, L4-L5 and L5-S1. Endplate spurring is noted mostly from L3-L4 through L5-S1. T12-L1: No significant disc bulging. No disc herniation. There is no significant retropulsion of the fractured posterior aspect of the L1 vertebrae. Central spinal canal is well preserved as are the neural foramina. L1-L2: No significant disc bulging. No disc herniation. No significant stenosis. L2-L3: No significant disc bulging. No disc herniation. No stenosis. L3-L4: Mild diffuse disc bulging. No significant stenosis. No disc herniation. L4-L5: Mild diffuse disc bulging. No disc herniation. Mild bilateral neural foraminal narrowing. L5-S1: Diffuse disc bulging. Mild facet degenerative change. No disc herniation. Moderate  to severe bilateral neural foraminal narrowing. Soft tissues show aortoiliac vascular calcifications. No soft tissue masses or adenopathy. IMPRESSION: 1. Mild compression fracture of L1 that appears recent, likely from the current fall. 2. No other acute findings.  No other fractures. 3. Degenerative changes as described. Moderate to severe bilateral neural foraminal narrowing at L5-S1 from disc bulging and endplate spurring. Electronically Signed   By: Lajean Manes M.D.   On: 09/13/2015 15:57   Mr Lumbar Spine W Wo Contrast  09/15/2015  CLINICAL DATA:  Low back pain.  History of breast cancer. EXAM: MRI LUMBAR SPINE WITHOUT AND WITH CONTRAST TECHNIQUE: Multiplanar and multiecho pulse sequences of the lumbar spine were obtained without and with intravenous contrast. CONTRAST:  60m MULTIHANCE GADOBENATE DIMEGLUMINE 529 MG/ML IV SOLN COMPARISON:  None. FINDINGS: The vertebral bodies of the lumbar spine are normal in size. The vertebral bodies of the lumbar spine are normal in alignment. There is degenerative disc disease of the lumbar spine most severe at L3-4, L4-5 and L5-S1. There is mild marrow edema within the L1 vertebral body with minimal height loss consistent  with a acute compression fracture with minimal buckling of the posterior cortex. There is linear signal abnormality with minimal enhancement and edema in the right T12 vertebral body concerning for a subacute compression fracture without significant height loss. There is no other marrow signal abnormality. There no findings to suggest osseous metastatic disease of the lumbar spine. The spinal cord is normal in signal and contour. The cord terminates normally at T12-L1 . The nerve roots of the cauda equina and the filum terminale are normal. The visualized portions of the SI joints are unremarkable. The imaged intra-abdominal contents are unremarkable. T12-L1: No significant disc bulge. No evidence of neural foraminal stenosis. No central canal stenosis. L1-L2: Mild broad-based disc bulge. Mild bilateral facet arthropathy. No evidence of neural foraminal stenosis. No central canal stenosis. L2-L3: No significant disc bulge. No evidence of neural foraminal stenosis. No central canal stenosis. L3-L4: Minimal broad-based disc bulge. No evidence of neural foraminal stenosis. No central canal stenosis. L4-L5: Mild broad-based disc bulge eccentric towards the left. No evidence of neural foraminal stenosis. No central canal stenosis. L5-S1: Mild broad-based disc osteophyte complex. Moderate bilateral foraminal narrowing. No central canal stenosis. IMPRESSION: 1. Acute compression fracture of the L1 vertebral body with minimal height loss. 2. Acute or subacute T12 vertebral body compression fracture without significant height loss along the right lateral aspect. 3. Lumbar spine spondylosis. Electronically Signed   By: HKathreen Devoid  On: 09/15/2015 13:31   Dg Chest Port 1 View  09/13/2015  CLINICAL DATA:  Failure to thrive, SOB, nausea, vomiting, loss of appetite, hx of CAD, HTN, prediabetes, breast cancer, and Non-small cell carcinoma of lung, stage 3 EXAM: PORTABLE CHEST 1 VIEW COMPARISON:  Chest CT, 08/04/2015.  Chest  radiographs, 06/21/2015. FINDINGS: There is increased hazy and more confluent airspace opacity in the right upper lobe when compared the prior study. There is more subtle at ill-defined nodular airspace opacity in the right lower lung. Left lung shows mild stable areas of scarring but is otherwise clear. No pleural effusion or pneumothorax. Cardiac silhouette is normal in size and configuration. There is mild prominence along the azygos region of the mediastinum. No convincing hilar masses or adenopathy. Bony thorax is demineralized but grossly intact. IMPRESSION: 1. Increased opacity most notable in the right upper lobe. This may reflect pneumonia. Alternatively that this may reflect treatment related pneumonitis. 2. No other change.  Electronically Signed   By: Lajean Manes M.D.   On: 09/13/2015 20:33    Microbiology: Recent Results (from the past 240 hour(s))  Urine culture     Status: None   Collection Time: 09/14/15  7:50 PM  Result Value Ref Range Status   Specimen Description URINE, CLEAN CATCH  Final   Special Requests Normal  Final   Culture   Final    10,000 COLONIES/mL ESCHERICHIA COLI Performed at [facility]    Report Status 09/17/2015 FINAL  Final   Organism ID, Bacteria ESCHERICHIA COLI  Final      Susceptibility   Escherichia coli - MIC*    AMPICILLIN >=32 RESISTANT Resistant     CEFAZOLIN <=4 SENSITIVE Sensitive     CEFTRIAXONE <=1 SENSITIVE Sensitive     CIPROFLOXACIN >=4 RESISTANT Resistant     GENTAMICIN <=1 SENSITIVE Sensitive     IMIPENEM <=0.25 SENSITIVE Sensitive     NITROFURANTOIN <=16 SENSITIVE Sensitive     TRIMETH/SULFA >=320 RESISTANT Resistant     AMPICILLIN/SULBACTAM >=32 RESISTANT Resistant     PIP/TAZO 8 SENSITIVE Sensitive     * 10,000 COLONIES/mL ESCHERICHIA COLI    Labs: Basic Metabolic Panel:  Recent Labs Lab 09/13/15 1426 09/14/15 0441 09/15/15 0537 09/16/15 0455  NA 138 138 136 134*  K 4.1 3.7 3.9 3.8  CL 103 104 107 102  CO2 '26  24 26 25  '$ GLUCOSE 110* 101* 110* 125*  BUN '16 14 12 12  '$ CREATININE 0.75 0.68 0.74 0.75  CALCIUM 9.4 8.8* 8.6* 8.6*   Liver Function Tests:  Recent Labs Lab 09/13/15 1426  AST 19  ALT 12*  ALKPHOS 56  BILITOT 0.7  PROT 7.7  ALBUMIN 3.4*   CBC:  Recent Labs Lab 09/13/15 1426 09/14/15 0441 09/15/15 0537 09/16/15 0455  WBC 5.6 3.1* 2.7* 4.2  NEUTROABS 3.2  --   --   --   HGB 11.1* 9.7* 9.2* 9.5*  HCT 34.2* 30.4* 29.4* 29.9*  MCV 87.5 89.7 91.0 90.6  PLT 340 279 310 318   Signed:  Barton Dubois  [facility] 09/17/2015, 12:20 PM

## 2015-09-17 NOTE — Clinical Social Work Placement (Signed)
   CLINICAL SOCIAL WORK PLACEMENT  NOTE  Date:  09/17/2015  Patient Details  Name: Pamela Mcguire MRN: [id number] Date of Birth: 1935/03/02  Clinical Social Work is seeking post-discharge placement for this patient at the [place] level of care (*CSW will initial, date and re-position this form in  chart as items are completed):  Yes   Patient/family provided with [place] Work Department's list of facilities offering this level of care within the geographic area requested by the patient (or if unable, by the patient's family).  Yes   Patient/family informed of their freedom to choose among providers that offer the needed level of care, that participate in Medicare, Medicaid or managed care program needed by the patient, have an available bed and are willing to accept the patient.  Yes   Patient/family informed of [place]'s ownership interest in [facility] and [facility], as well as of the fact that they are under no obligation to receive care at these facilities.  PASRR submitted to EDS on 09/15/15     PASRR number received on       Existing PASRR number confirmed on       FL2 transmitted to all facilities in geographic area requested by pt/family on 09/15/15     FL2 transmitted to all facilities within larger geographic area on       Patient informed that his/her managed care company has contracts with or will negotiate with certain facilities, including the following:        Yes   Patient/family informed of bed offers received.  Patient chooses bed at [facility]     Physician recommends and patient chooses bed at      Patient to be transferred to   on 09/17/15.  Patient to be transferred to facility by [facility]     Patient family notified on 09/17/15 of transfer.  Name of family member notified:  DAUGHTER     PHYSICIAN       Additional Comment: Pt / daughter are in agreement with d/c to [facility] today. [facility] transport  required. Pt / daughter are aware that out of pocket costs may be associated with [facility] transport. NSG reviewed D/c summary, scripts, avs. Scripts included in d/c packet. D/c summary sent to SNF for review prior to d/c. [employer] Medicare approved SNf placement.   _______________________________________________ Luretha Rued, [place] 09/17/2015, 3:08 PM

## 2015-09-17 NOTE — Progress Notes (Signed)
ANTIBIOTIC CONSULT NOTE - FOLLOW UP  Pharmacy Consult for Vancomycin Indication: rule out pneumonia  No Known Allergies  Patient Measurements: Height: '5\' 5"'$  (165.1 cm) Weight: 172 lb (78.019 kg) IBW/kg (Calculated) : 57  Vital Signs: Temp: 97.6 F (36.4 C) (10/19 1940) Temp Source: Oral (10/19 1940) BP: 128/64 mmHg (10/19 1940) Pulse Rate: 85 (10/19 1940) Intake/Output from previous day: 10/19 0701 - 10/20 0700 In: 2030 [P.O.:480; I.V.:1200; IV Piggyback:350] Out: 300 [Urine:300]  Labs:  Recent Labs  09/15/15 0537 09/16/15 0455  WBC 2.7* 4.2  HGB 9.2* 9.5*  PLT 310 318  CREATININE 0.74 0.75   Estimated Creatinine Clearance: 59.8 mL/min (by C-G formula based on Cr of 0.75).  Recent Labs  09/17/15 0435  VANCOTROUGH 23*     Assessment: 71 yoF with PMH of stage III non-small cell lung cancer undergoing chemoradiation, atrial fibrillation on Eliquis admitted with L1 compression fracture s/p fall. Found to have UTI per UA, started on Ceftriaxone. 10/16 CXR shows possible pneumonia or treatment related pneumonitis. Antibiotics were broadened to Zosyn and vancomycin per pharmacy for HCAP.  10/16 >> Ceftriaxone >> 10/17 10/17 >> Vancomycin >> 10/17 >> Zosyn >>   Afebrile  WBC increased, now WNL at 4.2 (10/19)  SCr 0.75 with CrCl ~ 60 ml/min (10/19)  CXR (10/19) with progressive RUL pneumonia  Vancomycin trough level 23, above goal range  Goal of Therapy:  Vancomycin trough level 15-20 mcg/ml Appropriate abx dosing, eradication of infection.   Plan:   Continue Zosyn 3.375g IV Q8H infused over 4hrs.   Decrease to Vancomycin 750 mg IV q12h.  Measure Vanc trough at steady state.  Follow up renal fxn, culture results, and clinical course.   Gretta Arab PharmD, BCPS Pager [phone] 09/17/2015 7:04 AM

## 2015-09-18 ENCOUNTER — Non-Acute Institutional Stay (SKILLED_NURSING_FACILITY): Payer: Medicare HMO | Admitting: Adult Health

## 2015-09-18 ENCOUNTER — Telehealth: Payer: Self-pay | Admitting: *Deleted

## 2015-09-18 ENCOUNTER — Encounter: Payer: Self-pay | Admitting: Adult Health

## 2015-09-18 DIAGNOSIS — C3410 Malignant neoplasm of upper lobe, unspecified bronchus or lung: Secondary | ICD-10-CM | POA: Diagnosis not present

## 2015-09-18 DIAGNOSIS — K219 Gastro-esophageal reflux disease without esophagitis: Secondary | ICD-10-CM | POA: Diagnosis not present

## 2015-09-18 DIAGNOSIS — F419 Anxiety disorder, unspecified: Secondary | ICD-10-CM | POA: Diagnosis not present

## 2015-09-18 DIAGNOSIS — F32A Depression, unspecified: Secondary | ICD-10-CM

## 2015-09-18 DIAGNOSIS — N390 Urinary tract infection, site not specified: Secondary | ICD-10-CM | POA: Diagnosis not present

## 2015-09-18 DIAGNOSIS — I482 Chronic atrial fibrillation, unspecified: Secondary | ICD-10-CM

## 2015-09-18 DIAGNOSIS — K5900 Constipation, unspecified: Secondary | ICD-10-CM

## 2015-09-18 DIAGNOSIS — J189 Pneumonia, unspecified organism: Secondary | ICD-10-CM | POA: Diagnosis not present

## 2015-09-18 DIAGNOSIS — F329 Major depressive disorder, single episode, unspecified: Secondary | ICD-10-CM

## 2015-09-18 DIAGNOSIS — T148 Other injury of unspecified body region: Secondary | ICD-10-CM | POA: Diagnosis not present

## 2015-09-18 DIAGNOSIS — E785 Hyperlipidemia, unspecified: Secondary | ICD-10-CM | POA: Diagnosis not present

## 2015-09-18 DIAGNOSIS — IMO0002 Reserved for concepts with insufficient information to code with codable children: Secondary | ICD-10-CM

## 2015-09-18 DIAGNOSIS — R531 Weakness: Secondary | ICD-10-CM | POA: Diagnosis not present

## 2015-09-18 NOTE — Telephone Encounter (Signed)
Pt was on TCM D/C 09/17/15, but sent to SNF...Pamela Mcguire

## 2015-09-20 NOTE — Progress Notes (Signed)
Patient ID: Pamela Mcguire, female   DOB: [birth year], 80 y.o.   MRN: [id number]    DATE:  09/18/15  MRN:  [id number]  BIRTHDAY: 1935-10-15  Facility:  Nursing Home Location:  [place] Room Number: 1204-P  LEVEL OF CARE:  SNF [phone])  Contact Information    Name Relation Home Work [place] Daughter [phone]  [phone]   [name] Daughter [phone]  [phone]      Chief Complaint  Patient presents with  . Hospitalization Follow-up    Generalized weakness, lumbar compression fracture, HCAP, UTI, constipation, atrial fibrillation, depression, GERD, anxiety and hyperlipidemia    HISTORY OF PRESENT ILLNESS:  This is a 80 year old female who was been admitted to [facility] on 09/17/15 from [facility]. She has PMH of none small cell carcinoma of lung stage III undergoing concurrent chemotherapy radiation therapy at the cancer center. She is receiving weekly carboplatin and paclitaxel with partial response. She also has a past medical history of atrial fibrillation and on Eliquis. She has been having generalized weakness for the past several months since undergoing chemotherapy. She had a fall @ home and CT scan showed acute L1 compression fracture. Her pain was managed with IV narcotics initially and is now on by mouth narcotics. She was also treated for healthcare associated pneumonia and UTI. She was discharged on Levaquin.  She has been admitted for a short-term rehabilitation.  PAST MEDICAL HISTORY:  Past Medical History  Diagnosis Date  . Hyperlipidemia   . Diverticulitis   . CAD (coronary artery disease)   . AF (atrial fibrillation) ([place])   . Hypertension   . Seasonal allergies   . Prediabetes   . Depression   . Shortness of breath dyspnea   . Anxiety   . Urinary frequency   . GERD (gastroesophageal reflux disease)   . Sleep apnea      dx'd 10/2014, has not received cpap yet (04/28/2015)  . Arthritis    "thighs, hips, arms" (04/28/2015)  . Breast cancer ([place])     right mastectomy  . Non-small cell carcinoma of lung, stage 3 ([place]) 04/30/2015     CURRENT MEDICATIONS: Reviewed  Patient's Medications  New Prescriptions   No medications on file  Previous Medications   ACETAMINOPHEN (TYLENOL) 500 MG TABLET    Take 2 tablets (1,000 mg total) by mouth every 8 (eight) hours.   ALBUTEROL (PROVENTIL HFA;VENTOLIN HFA) 108 (90 BASE) MCG/ACT INHALER    Inhale 1 puff into the lungs every 6 (six) hours as needed for wheezing or shortness of breath.   BUPROPION (WELLBUTRIN SR) 100 MG 12 HR TABLET    TAKE 1 TABLET(100 MG) BY MOUTH DAILY   CARVEDILOL (COREG) 3.125 MG TABLET    Take 1 tablet (3.125 mg total) by mouth 2 (two) times daily.   CHOLECALCIFEROL (VITAMIN D PO)    Take 1 tablet by mouth every morning.    CYANOCOBALAMIN (VITAMIN B-12 PO)    Take 1 capsule by mouth every morning.   CYCLOBENZAPRINE (FLEXERIL) 5 MG TABLET    Take 1 tablet (5 mg total) by mouth 3 (three) times daily as needed for muscle spasms.   ELIQUIS 5 MG TABS TABLET    TAKE 1 TABLET BY MOUTH TWICE DAILY   EMOLLIENT (BIAFINE) CREAM    Apply topically as needed.   ESCITALOPRAM (LEXAPRO) 10 MG TABLET    Take 1 tablet (10 mg total) by mouth daily.  EZETIMIBE-SIMVASTATIN (VYTORIN) 10-40 MG PER TABLET    Take 1 tablet by mouth daily.   FEEDING SUPPLEMENT (BOOST / RESOURCE BREEZE) LIQD    Take 1 Container by mouth 3 (three) times daily between meals.   FOLIC ACID (FOLVITE) 1 MG TABLET    TAKE 1 TABLET BY MOUTH TWICE DAILY   HYDROMORPHONE (DILAUDID) 2 MG TABLET    Take 1 tablet (2 mg total) by mouth every 8 (eight) hours as needed for severe pain.   LEVOFLOXACIN (LEVAQUIN) 750 MG TABLET    Take 1 tablet (750 mg total) by mouth daily. To complete 6 more days   LIDOCAINE (LIDODERM) 5 %    Place 1 patch onto the skin daily. Apply to lower back; Remove & Discard patch every 12 hours   LORATADINE (CLARITIN) 10 MG TABLET    Take 10 mg by mouth at  bedtime as needed for allergies.   LORAZEPAM (ATIVAN) 1 MG TABLET    Take 1 tablet (1 mg total) by mouth every 12 (twelve) hours as needed for anxiety.   MAGNESIUM OXIDE (MAG-OX) 400 (241.3 MG) MG TABLET    Take 1 tablet (400 mg total) by mouth 2 (two) times daily.   MOMETASONE-FORMOTEROL (DULERA) 100-5 MCG/ACT AERO    Inhale 2 puffs into the lungs 2 (two) times daily as needed for wheezing.    OXYCODONE (OXY IR/ROXICODONE) 5 MG IMMEDIATE RELEASE TABLET    Take 1-2 tablets (5-10 mg total) by mouth every 4 (four) hours as needed for moderate pain.   PANTOPRAZOLE (PROTONIX) 40 MG TABLET    Take 40 mg by mouth daily.   POLYETHYLENE GLYCOL (MIRALAX / GLYCOLAX) PACKET    Take 17 g by mouth daily as needed for mild constipation.   POTASSIUM CHLORIDE 20 MEQ TBCR    Take 10 mEq by mouth daily.   SUCRALFATE (CARAFATE) 1 G TABLET    Take 1 tablet (1 g total) by mouth 4 (four) times daily -  with meals and at bedtime. 5 min before meals for radiation induced esophagitis  Modified Medications   No medications on file  Discontinued Medications   No medications on file     No Known Allergies   REVIEW OF SYSTEMS:  GENERAL: no change in appetite, no fatigue, no weight changes, no fever, chills or weakness EYES: Denies change in vision, dry eyes, eye pain, itching or discharge EARS: Denies change in hearing, ringing in ears, or earache NOSE: Denies nasal congestion or epistaxis MOUTH and THROAT: Denies oral discomfort, gingival pain or bleeding, pain from teeth or hoarseness   RESPIRATORY: no cough, SOB, DOE, wheezing, hemoptysis CARDIAC: no chest pain, edema or palpitations GI: no abdominal pain, diarrhea, constipation, heart burn, nausea or vomiting GU: Denies dysuria, frequency, hematuria, incontinence, or discharge PSYCHIATRIC: Denies feeling of depression or anxiety. No report of hallucinations, insomnia, paranoia, or agitation   PHYSICAL EXAMINATION  GENERAL APPEARANCE: Well nourished. In no  acute distress. Normal body habitus HEAD: Normal in size and contour. No evidence of trauma EYES: Lids open and close normally. No blepharitis, entropion or ectropion. PERRL. Conjunctivae are clear and sclerae are white. Lenses are without opacity EARS: Pinnae are normal. Patient hears normal voice tunes of the examiner MOUTH and THROAT: Lips are without lesions. Oral mucosa is moist and without lesions. Tongue is normal in shape, size, and color and without lesions NECK: supple, trachea midline, no neck masses, no thyroid tenderness, no thyromegaly LYMPHATICS: no LAN in the neck, no supraclavicular LAN  RESPIRATORY: breathing is even & unlabored, BS CTAB CARDIAC: RRR, no murmur,no extra heart sounds, no edema GI: abdomen soft, normal BS, no masses, no tenderness, no hepatomegaly, no splenomegaly EXTREMITIES:  Able to move X 4 extremities PSYCHIATRIC: Alert and oriented X 3. Affect and behavior are appropriate  LABS/RADIOLOGY: Labs reviewed: Basic Metabolic Panel:  Recent Labs  06/24/15 2223  06/26/15 1323  09/14/15 0441 09/15/15 0537 09/16/15 0455  NA  --   < >  --   < > 138 136 134*  K  --   < >  --   < > 3.7 3.9 3.8  CL  --   < >  --   < > 104 107 102  CO2  --   < >  --   < > '24 26 25  '$ GLUCOSE  --   < >  --   < > 101* 110* 125*  BUN  --   < >  --   < > '14 12 12  '$ CREATININE  --   < >  --   < > 0.68 0.74 0.75  CALCIUM  --   < >  --   < > 8.8* 8.6* 8.6*  MG 1.2*  --  1.4*  --   --   --   --   < > = values in this interval not displayed. Liver Function Tests:  Recent Labs  06/29/15 0947 08/04/15 1018 09/13/15 1426  AST '11 14 19  '$ ALT 10 13 12*  ALKPHOS 41 60 56  BILITOT 0.46 0.49 0.7  PROT 6.3* 6.8 7.7  ALBUMIN 2.8* 3.0* 3.4*   CBC:  Recent Labs  06/29/15 0947 08/04/15 1018 09/13/15 1426 09/14/15 0441 09/15/15 0537 09/16/15 0455  WBC 2.5* 8.5 5.6 3.1* 2.7* 4.2  NEUTROABS 1.8 6.5 3.2  --   --   --   HGB 10.3* 10.8* 11.1* 9.7* 9.2* 9.5*  HCT 31.0* 32.6* 34.2*  30.4* 29.4* 29.9*  MCV 86.8 88.8 87.5 89.7 91.0 90.6  PLT 280 353 340 279 310 318   Cardiac Enzymes:  Recent Labs  06/21/15 2233  TROPONINI <0.03   CBG:  Recent Labs  04/10/15 1348  GLUCAP 96      Ct Abdomen Pelvis Wo Contrast  09/13/2015  CLINICAL DATA:  Fall off toilet 2 days ago. Abdominal and back pain. EXAM: CT ABDOMEN AND PELVIS WITHOUT CONTRAST TECHNIQUE: Multidetector CT imaging of the abdomen and pelvis was performed following the standard protocol without IV contrast. COMPARISON:  None. FINDINGS: Lower chest:  No acute findings. Hepatobiliary: No mass visualized on this un-enhanced exam. Prior cholecystectomy noted. No evidence of biliary dilatation. Pancreas: No mass or inflammatory process identified on this un-enhanced exam. Spleen: Within normal limits in size. Sub-cm low-attenuation lesion noted which is indeterminate but likely benign. Adrenals/Urinary Tract: No evidence of urolithiasis or hydronephrosis. 11 mm left adrenal mass is seen which is nonspecific but most likely represents a small adrenal adenoma in patient without history of cancer. Fluid attenuation right renal cyst also noted. Stomach/Bowel: Tiny hiatal hernia noted. No evidence of obstruction, inflammatory process, or abnormal fluid collections. Descending and sigmoid colon diverticulosis is demonstrated, without evidence of diverticulitis. Normal appendix visualized. Vascular/Lymphatic: No pathologically enlarged lymph nodes. No evidence of abdominal aortic aneurysm. Reproductive: No mass or other significant abnormality. Other: None. Musculoskeletal: Lumbar spine degenerative changes. Mild wedge compression fracture of the L1 vertebral body is seen which may be acute. No evidence of subluxation. IMPRESSION: Colonic diverticulosis. No  radiographic evidence of diverticulitis. No evidence of abdominal aortic aneurysm or retroperitoneal hemorrhage. Mild L1 vertebral body compression fracture, which is likely acute.  11 mm indeterminate left adrenal mass, which is nonspecific but likely represents a benign adenoma and patient without history of cancer. Followup by CT recommended in 12 months to confirm stability. This recommendation follows ACR consensus guidelines: Managing Incidental Findings on Abdominal CT: White Paper of the ACR Incidental Findings Committee. [facility] [phone] Electronically Signed   By: Earle Gell M.D.   On: 09/13/2015 15:38   Dg Chest 1 View  09/16/2015  CLINICAL DATA:  Pneumonia EXAM: CHEST 1 VIEW COMPARISON:  Three days ago FINDINGS: Progressive consolidation in the right upper lobe with mild volume loss today. No cavitary features or pleural effusion. Minimal opacification of the peripheral left base, chronic based on September 2016 CT. There is chronic lung disease with diffuse interstitial coarsening, likely smoking-related. Stable heart size and mediastinal contours. Right axillary dissection and mastectomy IMPRESSION: Progressive right upper lobe pneumonia. Electronically Signed   By: Monte Fantasia M.D.   On: 09/16/2015 07:29   Ct Lumbar Spine Wo Contrast  09/13/2015  CLINICAL DATA:  Status post fall today. Low back pain. History of breast carcinoma. EXAM: CT LUMBAR SPINE WITHOUT CONTRAST TECHNIQUE: Multidetector CT imaging of the lumbar spine was performed without intravenous contrast administration. Multiplanar CT image reconstructions were also generated. COMPARISON:  PET-CT, 04/10/2015.  Chest CT, 08/04/2015 FINDINGS: There is mild depression of the upper endplate of L1, new from the recent prior chest CT, consistent with a mild compression fracture. Fracture line is seen just below the upper endplate. There is some mild adjacent edema, but no discrete hematoma. There are no other fractures. There is normal vertebral body alignment. Mild loss disc height at T12-L1. There is moderate to marked loss of disc height at L3-L4, L4-L5 and L5-S1. Endplate spurring is noted  mostly from L3-L4 through L5-S1. T12-L1: No significant disc bulging. No disc herniation. There is no significant retropulsion of the fractured posterior aspect of the L1 vertebrae. Central spinal canal is well preserved as are the neural foramina. L1-L2: No significant disc bulging. No disc herniation. No significant stenosis. L2-L3: No significant disc bulging. No disc herniation. No stenosis. L3-L4: Mild diffuse disc bulging. No significant stenosis. No disc herniation. L4-L5: Mild diffuse disc bulging. No disc herniation. Mild bilateral neural foraminal narrowing. L5-S1: Diffuse disc bulging. Mild facet degenerative change. No disc herniation. Moderate to severe bilateral neural foraminal narrowing. Soft tissues show aortoiliac vascular calcifications. No soft tissue masses or adenopathy. IMPRESSION: 1. Mild compression fracture of L1 that appears recent, likely from the current fall. 2. No other acute findings.  No other fractures. 3. Degenerative changes as described. Moderate to severe bilateral neural foraminal narrowing at L5-S1 from disc bulging and endplate spurring. Electronically Signed   By: Lajean Manes M.D.   On: 09/13/2015 15:57   Mr Lumbar Spine W Wo Contrast  09/15/2015  CLINICAL DATA:  Low back pain.  History of breast cancer. EXAM: MRI LUMBAR SPINE WITHOUT AND WITH CONTRAST TECHNIQUE: Multiplanar and multiecho pulse sequences of the lumbar spine were obtained without and with intravenous contrast. CONTRAST:  41m MULTIHANCE GADOBENATE DIMEGLUMINE 529 MG/ML IV SOLN COMPARISON:  None. FINDINGS: The vertebral bodies of the lumbar spine are normal in size. The vertebral bodies of the lumbar spine are normal in alignment. There is degenerative disc disease of the lumbar spine most severe at L3-4, L4-5 and L5-S1. There is  mild marrow edema within the L1 vertebral body with minimal height loss consistent with a acute compression fracture with minimal buckling of the posterior cortex. There is  linear signal abnormality with minimal enhancement and edema in the right T12 vertebral body concerning for a subacute compression fracture without significant height loss. There is no other marrow signal abnormality. There no findings to suggest osseous metastatic disease of the lumbar spine. The spinal cord is normal in signal and contour. The cord terminates normally at T12-L1 . The nerve roots of the cauda equina and the filum terminale are normal. The visualized portions of the SI joints are unremarkable. The imaged intra-abdominal contents are unremarkable. T12-L1: No significant disc bulge. No evidence of neural foraminal stenosis. No central canal stenosis. L1-L2: Mild broad-based disc bulge. Mild bilateral facet arthropathy. No evidence of neural foraminal stenosis. No central canal stenosis. L2-L3: No significant disc bulge. No evidence of neural foraminal stenosis. No central canal stenosis. L3-L4: Minimal broad-based disc bulge. No evidence of neural foraminal stenosis. No central canal stenosis. L4-L5: Mild broad-based disc bulge eccentric towards the left. No evidence of neural foraminal stenosis. No central canal stenosis. L5-S1: Mild broad-based disc osteophyte complex. Moderate bilateral foraminal narrowing. No central canal stenosis. IMPRESSION: 1. Acute compression fracture of the L1 vertebral body with minimal height loss. 2. Acute or subacute T12 vertebral body compression fracture without significant height loss along the right lateral aspect. 3. Lumbar spine spondylosis. Electronically Signed   By: Kathreen Devoid   On: 09/15/2015 13:31   Dg Chest Port 1 View  09/13/2015  CLINICAL DATA:  Failure to thrive, SOB, nausea, vomiting, loss of appetite, hx of CAD, HTN, prediabetes, breast cancer, and Non-small cell carcinoma of lung, stage 3 EXAM: PORTABLE CHEST 1 VIEW COMPARISON:  Chest CT, 08/04/2015.  Chest radiographs, 06/21/2015. FINDINGS: There is increased hazy and more confluent airspace  opacity in the right upper lobe when compared the prior study. There is more subtle at ill-defined nodular airspace opacity in the right lower lung. Left lung shows mild stable areas of scarring but is otherwise clear. No pleural effusion or pneumothorax. Cardiac silhouette is normal in size and configuration. There is mild prominence along the azygos region of the mediastinum. No convincing hilar masses or adenopathy. Bony thorax is demineralized but grossly intact. IMPRESSION: 1. Increased opacity most notable in the right upper lobe. This may reflect pneumonia. Alternatively that this may reflect treatment related pneumonitis. 2. No other change. Electronically Signed   By: Lajean Manes M.D.   On: 09/13/2015 20:33    ASSESSMENT/PLAN:  Generalized weakness - for rehabilitation  Lumbar compression fracture - continue oxycodone 5 mg 1-2 tabs by mouth every 4 hours when necessary, Dilaudid 2 mg 1 tab by mouth every 8 hours when necessary and Lidoderm 5% 1 patch to skin daily for pain; Flexeril 5 mg 1 tab by mouth 3 times a day when necessary for muscle spasm  Healthcare associated pneumonia - continue Levaquin 750 mg by mouth daily 6 more days  UTI - continue Levaquin 6 more days  Constipation - continue MiraLAX 17 g by mouth daily when necessary  Atrial fibrillation - rate controlled; continue Coreg 3.125 mg by mouth twice a day and Eliquis 5 mg by mouth twice a day  Depression - mood is stable; continue Wellbutrin 100 mg SR by mouth daily and Lexapro 10 mg by mouth daily  GERD - continue Protonix 40 mg by mouth daily and Carafate 1 g by mouth 4  times a day  Anxiety - continue Ativan 1 mg 1 tab by mouth every 12 hours when necessary  Hyperlipidemia - continue Vytorin 10-40 milligrams 1 by mouth daily  Non-small cell cancer of the lung, stage III - follow-up at the cancer center    Goals of care:  Short-term rehabilitation    [facility], NP [facility] [phone]

## 2015-09-21 ENCOUNTER — Non-Acute Institutional Stay (SKILLED_NURSING_FACILITY): Payer: Medicare HMO | Admitting: Internal Medicine

## 2015-09-21 ENCOUNTER — Encounter: Payer: Self-pay | Admitting: Internal Medicine

## 2015-09-21 DIAGNOSIS — B9620 Unspecified Escherichia coli [E. coli] as the cause of diseases classified elsewhere: Secondary | ICD-10-CM

## 2015-09-21 DIAGNOSIS — K219 Gastro-esophageal reflux disease without esophagitis: Secondary | ICD-10-CM | POA: Diagnosis not present

## 2015-09-21 DIAGNOSIS — IMO0002 Reserved for concepts with insufficient information to code with codable children: Secondary | ICD-10-CM

## 2015-09-21 DIAGNOSIS — K5901 Slow transit constipation: Secondary | ICD-10-CM | POA: Diagnosis not present

## 2015-09-21 DIAGNOSIS — T148 Other injury of unspecified body region: Secondary | ICD-10-CM

## 2015-09-21 DIAGNOSIS — E46 Unspecified protein-calorie malnutrition: Secondary | ICD-10-CM

## 2015-09-21 DIAGNOSIS — F418 Other specified anxiety disorders: Secondary | ICD-10-CM | POA: Diagnosis not present

## 2015-09-21 DIAGNOSIS — J189 Pneumonia, unspecified organism: Secondary | ICD-10-CM

## 2015-09-21 DIAGNOSIS — I482 Chronic atrial fibrillation, unspecified: Secondary | ICD-10-CM

## 2015-09-21 DIAGNOSIS — N390 Urinary tract infection, site not specified: Secondary | ICD-10-CM

## 2015-09-21 DIAGNOSIS — C3411 Malignant neoplasm of upper lobe, right bronchus or lung: Secondary | ICD-10-CM | POA: Diagnosis not present

## 2015-09-21 DIAGNOSIS — R5381 Other malaise: Secondary | ICD-10-CM

## 2015-09-21 NOTE — Progress Notes (Signed)
Patient ID: Pamela Mcguire, female   DOB: [birth year], 80 y.o.   MRN: [id number]     [place]  PCP: Mauricio Po, FNP  Code Status: DNR  No Known Allergies  Chief Complaint  Patient presents with  . New Admit To SNF    New admission      HPI:  80 y.o. patient is here for short term rehabilitation post hospital admission from 09/13/15-09/17/15 post fall with L1 compression fracture. She was conservatively managed with pain medications. She was noticed to have protein calorie malnutrition. She was also treated for HCAP and e.coli with iv antibiotics and transitioned to oral antibiotics at time of discharge. She has PMH of non small cell lung cancer stage 3 undergoing chemoradiation therapy, afib, HTN, gerd among others. She is seen in her room today. She complaints of pain 9/10 on her lower back and on review of her med list had last received oxycodone at 5 am. She complaints of generalized weakness. She has been constipated and feels bloated and feels like she will have one today.   Review of Systems:  Constitutional: Negative for fever, chills, diaphoresis.  HENT: Negative for headache, congestion, nasal discharge. Eyes: Negative for eye pain, blurred vision, double vision and discharge.  Respiratory: positive for cough and dyspnea with minimal exertion. On o2 Cardiovascular: Negative for chest pain, palpitations, leg swelling.  Gastrointestinal: Negative for heartburn, nausea, vomiting, abdominal pain Genitourinary: Negative for dysuria, flank pain.  Musculoskeletal: Negative for falls in facility. Had 2 falls at home Skin: Negative for itching, rash.  Neurological: Negative for dizziness, tingling Psychiatric/Behavioral: Negative for depression   Past Medical History  Diagnosis Date  . Hyperlipidemia   . Diverticulitis   . CAD (coronary artery disease)   . AF (atrial fibrillation) ([place])   . Hypertension   . Seasonal allergies   . Prediabetes   .  Depression   . Shortness of breath dyspnea   . Anxiety   . Urinary frequency   . GERD (gastroesophageal reflux disease)   . Sleep apnea      dx'd 10/2014, has not received cpap yet (04/28/2015)  . Arthritis     "thighs, hips, arms" (04/28/2015)  . Breast cancer ([place])     right mastectomy  . Non-small cell carcinoma of lung, stage 3 ([place]) 04/30/2015   Past Surgical History  Procedure Laterality Date  . Cardioversion    . Tonsillectomy    . Colonoscopy    . Cesarean section  X 2  . Mastectomy Right   . Breast surgery    . Mediastinoscopy N/A 04/28/2015    Procedure: MEDIASTINOSCOPY;  Surgeon: [name], MD;  Location: [facility] OR;  Service: Thoracic;  Laterality: N/A;   Social History:   reports that she quit smoking about 22 years ago. Her smoking use included Cigarettes. She has a 35 pack-year smoking history. She has never used smokeless tobacco. She reports that she drinks about 5.4 oz of alcohol per week. She reports that she does not use illicit drugs.  History reviewed. No pertinent family history.  Medications:   Medication List       This list is accurate as of: 09/21/15  9:20 AM.  Always use your most recent med list.               acetaminophen 500 MG tablet  Commonly known as:  TYLENOL  Take 2 tablets (1,000 mg total) by mouth every 8 (eight) hours.  albuterol 108 (90 BASE) MCG/ACT inhaler  Commonly known as:  PROVENTIL HFA;VENTOLIN HFA  Inhale 1 puff into the lungs every 6 (six) hours as needed for wheezing or shortness of breath.     budesonide-formoterol 160-4.5 MCG/ACT inhaler  Commonly known as:  SYMBICORT  Inhale 2 puffs into the lungs 2 (two) times daily as needed (For wheezing, wait 1 min between each puff).     buPROPion 100 MG 12 hr tablet  Commonly known as:  WELLBUTRIN SR  TAKE 1 TABLET(100 MG) BY MOUTH DAILY     carvedilol 3.125 MG tablet  Commonly known as:  COREG  Take 1 tablet (3.125 mg total) by mouth 2 (two) times daily.      cyclobenzaprine 5 MG tablet  Commonly known as:  FLEXERIL  Take 1 tablet (5 mg total) by mouth 3 (three) times daily as needed for muscle spasms.     ELIQUIS 5 MG Tabs tablet  Generic drug:  apixaban  TAKE 1 TABLET BY MOUTH TWICE DAILY     emollient cream  Commonly known as:  BIAFINE  Apply topically as needed.     escitalopram 10 MG tablet  Commonly known as:  LEXAPRO  Take 1 tablet (10 mg total) by mouth daily.     ezetimibe-simvastatin 10-40 MG tablet  Commonly known as:  VYTORIN  Take 1 tablet by mouth daily.     feeding supplement Liqd  Take 1 Container by mouth 3 (three) times daily between meals.     folic acid 1 MG tablet  Commonly known as:  FOLVITE  TAKE 1 TABLET BY MOUTH TWICE DAILY     HYDROmorphone 2 MG tablet  Commonly known as:  DILAUDID  Take 1 tablet (2 mg total) by mouth every 8 (eight) hours as needed for severe pain.     levofloxacin 750 MG tablet  Commonly known as:  LEVAQUIN  Take 1 tablet (750 mg total) by mouth daily. To complete 6 more days     lidocaine 5 %  Commonly known as:  LIDODERM  Place 1 patch onto the skin daily. Apply to lower back; Remove & Discard patch every 12 hours     loratadine 10 MG tablet  Commonly known as:  CLARITIN  Take 10 mg by mouth at bedtime as needed for allergies.     LORazepam 1 MG tablet  Commonly known as:  ATIVAN  Take 1 tablet (1 mg total) by mouth every 12 (twelve) hours as needed for anxiety.     magnesium oxide 400 (241.3 MG) MG tablet  Commonly known as:  MAG-OX  Take 1 tablet (400 mg total) by mouth 2 (two) times daily.     oxyCODONE 5 MG immediate release tablet  Commonly known as:  Oxy IR/ROXICODONE  Take 1-2 tablets (5-10 mg total) by mouth every 4 (four) hours as needed for moderate pain.     pantoprazole 40 MG tablet  Commonly known as:  PROTONIX  Take 40 mg by mouth daily.     polyethylene glycol packet  Commonly known as:  MIRALAX / GLYCOLAX  Take 17 g by mouth daily as needed for mild  constipation.     Potassium Chloride ER 20 MEQ Tbcr  Take 10 mEq by mouth daily.     sucralfate 1 G tablet  Commonly known as:  CARAFATE  Take 1 tablet (1 g total) by mouth 4 (four) times daily -  with meals and at bedtime. 5 min before meals for radiation  induced esophagitis     VITAMIN B-12 PO  Take 1 capsule by mouth every morning.     VITAMIN D PO  Take 1 tablet by mouth every morning.         Physical Exam: Filed Vitals:   09/21/15 0909  BP: 143/80  Pulse: 93  Temp: 98.5 F (36.9 C)  TempSrc: Oral  Resp: 20  Height: '5\' 5"'$  (1.651 m)  Weight: 172 lb (78.019 kg)  SpO2: 97%    General- elderly female, frail and ill appearing, appears uncomfortable Head- normocephalic, atraumatic Nose- normal nasal mucosa, no maxillary or frontal sinus tenderness, no nasal discharge Throat- moist mucus membrane Eyes- PERRLA, EOMI, no pallor, no icterus, no discharge, normal conjunctiva, normal sclera Neck- no cervical lymphadenopathy Cardiovascular- normal s1,s2, tachycardic, trace leg edema Respiratory- bilateral poor air entry, no wheeze, no rhonchi, no crackles, some use of abdominal wall muscles noted  Abdomen- bowel sounds present, soft, distended, non tender Musculoskeletal- able to move all 4 extremities, generalized weakness, on wheelchair  Neurological- no focal deficit, alert and oriented  Skin- warm and dry Psychiatry- normal mood and affect    Labs reviewed: Basic Metabolic Panel:  Recent Labs  06/24/15 2223  06/26/15 1323  09/14/15 0441 09/15/15 0537 09/16/15 0455  NA  --   < >  --   < > 138 136 134*  K  --   < >  --   < > 3.7 3.9 3.8  CL  --   < >  --   < > 104 107 102  CO2  --   < >  --   < > '24 26 25  '$ GLUCOSE  --   < >  --   < > 101* 110* 125*  BUN  --   < >  --   < > '14 12 12  '$ CREATININE  --   < >  --   < > 0.68 0.74 0.75  CALCIUM  --   < >  --   < > 8.8* 8.6* 8.6*  MG 1.2*  --  1.4*  --   --   --   --   < > = values in this interval not  displayed. Liver Function Tests:  Recent Labs  06/29/15 0947 08/04/15 1018 09/13/15 1426  AST '11 14 19  '$ ALT 10 13 12*  ALKPHOS 41 60 56  BILITOT 0.46 0.49 0.7  PROT 6.3* 6.8 7.7  ALBUMIN 2.8* 3.0* 3.4*   No results for input(s): LIPASE, AMYLASE in the last 8760 hours. No results for input(s): AMMONIA in the last 8760 hours. CBC:  Recent Labs  06/29/15 0947 08/04/15 1018 09/13/15 1426 09/14/15 0441 09/15/15 0537 09/16/15 0455  WBC 2.5* 8.5 5.6 3.1* 2.7* 4.2  NEUTROABS 1.8 6.5 3.2  --   --   --   HGB 10.3* 10.8* 11.1* 9.7* 9.2* 9.5*  HCT 31.0* 32.6* 34.2* 30.4* 29.4* 29.9*  MCV 86.8 88.8 87.5 89.7 91.0 90.6  PLT 280 353 340 279 310 318   Cardiac Enzymes:  Recent Labs  06/21/15 2233  TROPONINI <0.03   BNP: Invalid input(s): POCBNP CBG:  Recent Labs  04/10/15 1348  GLUCAP 96    Radiological Exams: Ct Abdomen Pelvis Wo Contrast  09/13/2015  CLINICAL DATA:  Fall off toilet 2 days ago. Abdominal and back pain. EXAM: CT ABDOMEN AND PELVIS WITHOUT CONTRAST TECHNIQUE: Multidetector CT imaging of the abdomen and pelvis was performed following the standard protocol without IV contrast.  COMPARISON:  None. FINDINGS: Lower chest:  No acute findings. Hepatobiliary: No mass visualized on this un-enhanced exam. Prior cholecystectomy noted. No evidence of biliary dilatation. Pancreas: No mass or inflammatory process identified on this un-enhanced exam. Spleen: Within normal limits in size. Sub-cm low-attenuation lesion noted which is indeterminate but likely benign. Adrenals/Urinary Tract: No evidence of urolithiasis or hydronephrosis. 11 mm left adrenal mass is seen which is nonspecific but most likely represents a small adrenal adenoma in patient without history of cancer. Fluid attenuation right renal cyst also noted. Stomach/Bowel: Tiny hiatal hernia noted. No evidence of obstruction, inflammatory process, or abnormal fluid collections. Descending and sigmoid colon  diverticulosis is demonstrated, without evidence of diverticulitis. Normal appendix visualized. Vascular/Lymphatic: No pathologically enlarged lymph nodes. No evidence of abdominal aortic aneurysm. Reproductive: No mass or other significant abnormality. Other: None. Musculoskeletal: Lumbar spine degenerative changes. Mild wedge compression fracture of the L1 vertebral body is seen which may be acute. No evidence of subluxation. IMPRESSION: Colonic diverticulosis. No radiographic evidence of diverticulitis. No evidence of abdominal aortic aneurysm or retroperitoneal hemorrhage. Mild L1 vertebral body compression fracture, which is likely acute. 11 mm indeterminate left adrenal mass, which is nonspecific but likely represents a benign adenoma and patient without history of cancer. Followup by CT recommended in 12 months to confirm stability. This recommendation follows ACR consensus guidelines: Managing Incidental Findings on Abdominal CT: White Paper of the ACR Incidental Findings Committee. Natasha [facility] Radiol [id number] Electronically Signed   By: Earle Gell M.D.   On: 09/13/2015 15:38   Ct Lumbar Spine Wo Contrast  09/13/2015  CLINICAL DATA:  Status post fall today. Low back pain. History of breast carcinoma. EXAM: CT LUMBAR SPINE WITHOUT CONTRAST TECHNIQUE: Multidetector CT imaging of the lumbar spine was performed without intravenous contrast administration. Multiplanar CT image reconstructions were also generated. COMPARISON:  PET-CT, 04/10/2015.  Chest CT, 08/04/2015 FINDINGS: There is mild depression of the upper endplate of L1, new from the recent prior chest CT, consistent with a mild compression fracture. Fracture line is seen just below the upper endplate. There is some mild adjacent edema, but no discrete hematoma. There are no other fractures. There is normal vertebral body alignment. Mild loss disc height at T12-L1. There is moderate to marked loss of disc height at L3-L4, L4-L5 and L5-S1.  Endplate spurring is noted mostly from L3-L4 through L5-S1. T12-L1: No significant disc bulging. No disc herniation. There is no significant retropulsion of the fractured posterior aspect of the L1 vertebrae. Central spinal canal is well preserved as are the neural foramina. L1-L2: No significant disc bulging. No disc herniation. No significant stenosis. L2-L3: No significant disc bulging. No disc herniation. No stenosis. L3-L4: Mild diffuse disc bulging. No significant stenosis. No disc herniation. L4-L5: Mild diffuse disc bulging. No disc herniation. Mild bilateral neural foraminal narrowing. L5-S1: Diffuse disc bulging. Mild facet degenerative change. No disc herniation. Moderate to severe bilateral neural foraminal narrowing. Soft tissues show aortoiliac vascular calcifications. No soft tissue masses or adenopathy. IMPRESSION: 1. Mild compression fracture of L1 that appears recent, likely from the current fall. 2. No other acute findings.  No other fractures. 3. Degenerative changes as described. Moderate to severe bilateral neural foraminal narrowing at L5-S1 from disc bulging and endplate spurring. Electronically Signed   By: Lajean Manes M.D.   On: 09/13/2015 15:57   Dg Chest Port 1 View  09/13/2015  CLINICAL DATA:  Failure to thrive, SOB, nausea, vomiting, loss of appetite, hx of CAD, HTN, prediabetes,  breast cancer, and Non-small cell carcinoma of lung, stage 3 EXAM: PORTABLE CHEST 1 VIEW COMPARISON:  Chest CT, 08/04/2015.  Chest radiographs, 06/21/2015. FINDINGS: There is increased hazy and more confluent airspace opacity in the right upper lobe when compared the prior study. There is more subtle at ill-defined nodular airspace opacity in the right lower lung. Left lung shows mild stable areas of scarring but is otherwise clear. No pleural effusion or pneumothorax. Cardiac silhouette is normal in size and configuration. There is mild prominence along the azygos region of the mediastinum. No convincing  hilar masses or adenopathy. Bony thorax is demineralized but grossly intact. IMPRESSION: 1. Increased opacity most notable in the right upper lobe. This may reflect pneumonia. Alternatively that this may reflect treatment related pneumonitis. 2. No other change. Electronically Signed   By: Lajean Manes M.D.   On: 09/13/2015 20:33    Assessment/Plan  Physical deconditioning With recent HCAP, UTI, compression fracture and chemoradiation for her lung cancer. Will have her work with physical therapy and occupational therapy team to help with gait training and muscle strengthening exercises.fall precautions. Skin care. Encourage to be out of bed.   E.coli uti Continue and complete course of levaquin on 09/22/15. Hydration encouraged  HCAP Continue o2 and symbicort. Continue levaquin until 09/22/15  Lumbar compression fracture To provide oxycodone 5 mg 2 tab x 1 now. Continue tylenol 1000 mg tid with oxycodone 5 mg 1-2 tab q4h prn for moderate pain and dilaudid 2 mg q8h prn for severe pain. Continue lidoderm 5% patch. Continue flexeril 5 mg tid prn muscle spasm. Will have patient work with PT/OT as tolerated to regain strength and restore function.  Fall precautions are in place. Fall precautions. Continue vitamin d supplement  Constipation On miralax daily prn. Last bowel movement 3 days back and at home had one daily. Add prune juice to meal tray and change miralax to daily for now and reassess  Protein calorie malnutrition Continue feeding supplement. To provide skin care and fall precautions. Dietary consult and monitor weight  Atrial fibrillation  Rate controlled. continue Coreg 3.125 mg bid with eliquis 5 mg bid for anticoagulation  Non small cell lung cancer Continue to follow at cancer center. Continue o2  gerd Stable symptoms at present. Continue protonix 40 mg daily with carafate 1 g qid and monitor  Depression with anxiety Continue lexapro 10 mg daily, wellbutrin 100 mg daily  and ativan 1 mg bid prn   Goals of care: short term rehabilitation   Labs/tests ordered: cbc with diff, cmp 09/23/15  Family/ staff Communication: reviewed care plan with patient and nursing supervisor    Blanchie Serve, MD  [facility] [phone] (Monday-Friday 8 am - 5 pm) [phone] (afterhours)

## 2015-10-02 ENCOUNTER — Other Ambulatory Visit: Payer: Self-pay | Admitting: Internal Medicine

## 2015-10-02 NOTE — Telephone Encounter (Signed)
Pt requesting a refill. Please advise

## 2015-10-05 ENCOUNTER — Other Ambulatory Visit: Payer: Self-pay | Admitting: Internal Medicine

## 2015-10-05 MED ORDER — ESCITALOPRAM OXALATE 10 MG PO TABS: 10.0000 mg | ORAL_TABLET | Freq: Every day | ORAL | Status: DC

## 2015-10-05 NOTE — Addendum Note (Signed)
Addended by: Fidel Levy on: 10/05/2015 12:11 PM   Modules accepted: Orders

## 2015-10-05 NOTE — Telephone Encounter (Signed)
Rx to be phoned in.

## 2015-10-10 ENCOUNTER — Other Ambulatory Visit: Payer: Self-pay | Admitting: Adult Health

## 2015-10-12 ENCOUNTER — Other Ambulatory Visit: Payer: Self-pay

## 2015-10-12 MED ORDER — POTASSIUM CHLORIDE ER 10 MEQ PO TBCR: 10.0000 meq | EXTENDED_RELEASE_TABLET | Freq: Every day | ORAL | Status: DC

## 2015-10-13 DIAGNOSIS — S82091D Other fracture of right patella, subsequent encounter for closed fracture with routine healing: Secondary | ICD-10-CM | POA: Diagnosis not present

## 2015-10-13 DIAGNOSIS — S32010D Wedge compression fracture of first lumbar vertebra, subsequent encounter for fracture with routine healing: Secondary | ICD-10-CM | POA: Diagnosis not present

## 2015-10-13 DIAGNOSIS — M150 Primary generalized (osteo)arthritis: Secondary | ICD-10-CM | POA: Diagnosis not present

## 2015-10-13 DIAGNOSIS — C3411 Malignant neoplasm of upper lobe, right bronchus or lung: Secondary | ICD-10-CM | POA: Diagnosis not present

## 2015-10-17 ENCOUNTER — Other Ambulatory Visit: Payer: Self-pay | Admitting: Adult Health

## 2015-10-19 ENCOUNTER — Encounter: Payer: Self-pay | Admitting: Family

## 2015-10-19 ENCOUNTER — Other Ambulatory Visit: Payer: Self-pay | Admitting: Internal Medicine

## 2015-10-19 ENCOUNTER — Other Ambulatory Visit (INDEPENDENT_AMBULATORY_CARE_PROVIDER_SITE_OTHER): Payer: Medicare HMO

## 2015-10-19 ENCOUNTER — Ambulatory Visit (INDEPENDENT_AMBULATORY_CARE_PROVIDER_SITE_OTHER): Payer: Medicare HMO | Admitting: Family

## 2015-10-19 VITALS — BP 140/90 | HR 103 | Temp 97.6°F | Resp 22 | Ht 65.0 in | Wt 166.8 lb

## 2015-10-19 DIAGNOSIS — R531 Weakness: Secondary | ICD-10-CM

## 2015-10-19 DIAGNOSIS — S32019D Unspecified fracture of first lumbar vertebra, subsequent encounter for fracture with routine healing: Secondary | ICD-10-CM

## 2015-10-19 DIAGNOSIS — C3411 Malignant neoplasm of upper lobe, right bronchus or lung: Secondary | ICD-10-CM

## 2015-10-19 DIAGNOSIS — S32019A Unspecified fracture of first lumbar vertebra, initial encounter for closed fracture: Secondary | ICD-10-CM | POA: Insufficient documentation

## 2015-10-19 LAB — COMPREHENSIVE METABOLIC PANEL
ALBUMIN: 3.7 g/dL (ref 3.5–5.2)
ALK PHOS: 70 U/L (ref 39–117)
ALT: 15 U/L (ref 0–35)
AST: 19 U/L (ref 0–37)
BILIRUBIN TOTAL: 0.3 mg/dL (ref 0.2–1.2)
BUN: 16 mg/dL (ref 6–23)
CO2: 27 mEq/L (ref 19–32)
Calcium: 9.7 mg/dL (ref 8.4–10.5)
Chloride: 101 mEq/L (ref 96–112)
Creatinine, Ser: 0.88 mg/dL (ref 0.40–1.20)
GFR: 65.92 mL/min (ref >60.00)
GLUCOSE: 110 mg/dL — AB (ref 70–99)
POTASSIUM: 4.8 meq/L (ref 3.5–5.1)
SODIUM: 135 meq/L (ref 135–145)
TOTAL PROTEIN: 7.1 g/dL (ref 6.0–8.3)

## 2015-10-19 MED ORDER — OXYCODONE HCL 5 MG PO TABS: 5.0000 mg | ORAL_TABLET | ORAL | Status: DC | PRN

## 2015-10-19 NOTE — Assessment & Plan Note (Addendum)
Likely multifactorial in nature including decreased nutritional intake, deconditioning and malignancy treatment.Obtain CBC and CMET. Encouraged frequent small meals and continued use of supplemental nutrition shakes between meals. Continue at home physical and occupational therapy to increase strength and endurance. Once again encouraged a potential palliative care consult to help with symptoms and overall goals of care. Continue use of rollator for mobility assistance.

## 2015-10-19 NOTE — Assessment & Plan Note (Signed)
LIkely contributing factor to shortness of breath and weakness. Follow up with oncology as scheduled for CT scan, blood work, and follow up appointment. If appropriate, recommend palliative care discussion.

## 2015-10-19 NOTE — Progress Notes (Signed)
Subjective:    Patient ID: Pamela Mcguire, female    DOB: [birth year], 80 y.o.   MRN: [id number]  Chief Complaint  Patient presents with  . Follow-up    still weak and dizzy all the time, SOB    HPI:  Pamela Mcguire is a 80 y.o. female who  has a past medical history of Hyperlipidemia; Diverticulitis; CAD (coronary artery disease); AF (atrial fibrillation) ([place]); Hypertension; Seasonal allergies; Prediabetes; Depression; Shortness of breath dyspnea; Anxiety; Urinary frequency; GERD (gastroesophageal reflux disease); Sleep apnea; Arthritis; Breast cancer ([place]); and Non-small cell carcinoma of lung, stage 3 ([place]) (04/30/2015). and presents today for a follow up office visit.   Recently evaluated in the ED and admitted to the hospital following a fall when she was trying to get off the toilet falling on her back and left hip. She was on blood thinners at the time. The pain she was experiencing was refractory to the Vicodin she was taking. CT of abdomen/lumbar spine with L1 fracture. She was attempted on valium for pain and had an O2 sat of 80% on 2L. She failed attempted ambulation and was also with poor intake and generalized weakness. She was admitted for observation and for deconditioning. Upon discharge to rehabilitation she her back pain was improved but continued. Concern for continued decline. Suspected HCAP on chest x-ray and was discharged and treated with levofloxacin. She had no fever or shortness of breath at discharge. Hypotension and constipation most likely related to pain management. All hospital records were reviewed in detail.   Since leaving the hospital she indicates that she continues to feel weak and dizzy with shortness of breath. Chemotherapy is currently on hold and has follow up with Oncology for consideration of further treatment. She has a CT scan in a couple of weeks and follow up with oncology to determine further treatment. She is working with at home physical and occupational  therapy and according to family is making progress by being able to walk about 90 feet with her rollator. Pain management continues to be a concern balancing risk for falls and pain control. Family indicates she primarily takes the oxycodone at night for comfort. Patient and family are also considering palliative care. Her intake is also poor. She is supplementing with Boost between meals and generally eating about 2 meals per day.   Wt Readings from Last 3 Encounters:  10/19/15 166 lb 12.8 oz (75.66 kg)  09/21/15 172 lb (78.019 kg)  09/18/15 172 lb (78.019 kg)    No Known Allergies   Current Outpatient Prescriptions on File Prior to Visit  Medication Sig Dispense Refill  . acetaminophen (TYLENOL) 500 MG tablet Take 2 tablets (1,000 mg total) by mouth every 8 (eight) hours. 30 tablet 0  . albuterol (PROVENTIL HFA;VENTOLIN HFA) 108 (90 BASE) MCG/ACT inhaler Inhale 1 puff into the lungs every 6 (six) hours as needed for wheezing or shortness of breath.    . budesonide-formoterol (SYMBICORT) 160-4.5 MCG/ACT inhaler Inhale 2 puffs into the lungs 2 (two) times daily as needed (For wheezing, wait 1 min between each puff).    Marland Kitchen buPROPion (WELLBUTRIN SR) 100 MG 12 hr tablet TAKE 1 TABLET(100 MG) BY MOUTH DAILY 90 tablet 0  . carvedilol (COREG) 3.125 MG tablet Take 1 tablet (3.125 mg total) by mouth 2 (two) times daily. 180 tablet 3  . Cholecalciferol (VITAMIN D PO) Take 1 tablet by mouth every morning.     . Cyanocobalamin (VITAMIN B-12 PO) Take 1 capsule by mouth  every morning.    . cyclobenzaprine (FLEXERIL) 5 MG tablet Take 1 tablet (5 mg total) by mouth 3 (three) times daily as needed for muscle spasms. 10 tablet 0  . ELIQUIS 5 MG TABS tablet TAKE 1 TABLET BY MOUTH TWICE DAILY 60 tablet 5  . emollient (BIAFINE) cream Apply topically as needed. (Patient taking differently: Apply 1 application topically as needed (radiation burns). ) 454 g 0  . escitalopram (LEXAPRO) 10 MG tablet Take 1 tablet (10  mg total) by mouth daily. 30 tablet 0  . ezetimibe-simvastatin (VYTORIN) 10-40 MG per tablet Take 1 tablet by mouth daily. 90 tablet 3  . feeding supplement (BOOST / RESOURCE BREEZE) LIQD Take 1 Container by mouth 3 (three) times daily between meals. 90 Container 0  . folic acid (FOLVITE) 1 MG tablet TAKE 1 TABLET BY MOUTH TWICE DAILY 180 tablet 0  . HYDROmorphone (DILAUDID) 2 MG tablet Take 1 tablet (2 mg total) by mouth every 8 (eight) hours as needed for severe pain. 20 tablet 0  . loratadine (CLARITIN) 10 MG tablet Take 10 mg by mouth at bedtime as needed for allergies.    Marland Kitchen LORazepam (ATIVAN) 1 MG tablet Take 1 tablet (1 mg total) by mouth every 12 (twelve) hours as needed for anxiety. 20 tablet 0  . magnesium oxide (MAG-OX) 400 (241.3 MG) MG tablet Take 1 tablet (400 mg total) by mouth 2 (two) times daily. 60 tablet 0  . pantoprazole (PROTONIX) 40 MG tablet Take 40 mg by mouth daily.    . polyethylene glycol (MIRALAX / GLYCOLAX) packet Take 17 g by mouth daily as needed for mild constipation. 14 each 0  . potassium chloride (K-DUR) 10 MEQ tablet Take 1 tablet (10 mEq total) by mouth daily. 90 tablet 0  . potassium chloride 20 MEQ TBCR Take 10 mEq by mouth daily. 30 tablet 0  . sucralfate (CARAFATE) 1 G tablet Take 1 tablet (1 g total) by mouth 4 (four) times daily -  with meals and at bedtime. 5 min before meals for radiation induced esophagitis 120 tablet 2   No current facility-administered medications on file prior to visit.    Review of Systems  Constitutional: Positive for appetite change. Negative for fever and chills.  Respiratory: Positive for shortness of breath. Negative for chest tightness and wheezing.   Cardiovascular: Negative for chest pain, palpitations and leg swelling.  Neurological: Positive for dizziness and weakness.      Objective:    BP 140/90 mmHg  Pulse 103  Temp(Src) 97.6 F (36.4 C) (Oral)  Resp 22  Ht '5\' 5"'$  (1.651 m)  Wt 166 lb 12.8 oz (75.66 kg)   BMI 27.76 kg/m2  SpO2 98% Nursing note and vital signs reviewed.  Physical Exam  Constitutional: She is oriented to person, place, and time. She appears well-developed and well-nourished. No distress.  Cardiovascular: Normal rate, regular rhythm, normal heart sounds and intact distal pulses.   Pulmonary/Chest: Effort normal and breath sounds normal.  Musculoskeletal:  No obvious deformity, discoloration or edema noted. Tenderness of lumbar spine and paraspinal musculature. ROM testing deferred secondary to pain and weakness when standing.   Neurological: She is alert and oriented to person, place, and time.  Skin: Skin is warm and dry.  Psychiatric: She has a normal mood and affect. Her behavior is normal. Judgment and thought content normal.       Assessment & Plan:   Problem List Items Addressed This Visit      Respiratory  Adenocarcinoma of upper lobe of right lung    LIkely contributing factor to shortness of breath and weakness. Follow up with oncology as scheduled for CT scan, blood work, and follow up appointment. If appropriate, recommend palliative care discussion.       Relevant Medications   oxyCODONE (OXY IR/ROXICODONE) 5 MG immediate release tablet     Musculoskeletal and Integument   Fracture of L1 vertebra (HCC)    Fracture of L1 with challenges in pain management. Currently maintained on oxycodone as needed. Movement and therapy is also limited secondary to weakness. Discussed in detail pain management and reducing fall risk and the fine balance which is a challenge. Continue current dosage of oxycodone as prescribed. Restart cyclobenzaprine. Recommend over the counter / non-pharmacological interventions including TENS units, ice/hot, and Thermacare heating pads. Follow up pending trial of over the counter / non-pharmacological interventions or if symptoms worsen or do not improve.       Relevant Medications   oxyCODONE (OXY IR/ROXICODONE) 5 MG immediate release  tablet     Other   Weakness - Primary    Likely multifactorial in nature including decreased nutritional intake, deconditioning and malignancy treatment.Obtain CBC and CMET. Encouraged frequent small meals and continued use of supplemental nutrition shakes between meals. Continue at home physical and occupational therapy to increase strength and endurance. Once again encouraged a potential palliative care consult to help with symptoms and overall goals of care. Continue use of rollator for mobility assistance.       Relevant Orders   Comprehensive metabolic panel   CBC

## 2015-10-19 NOTE — Progress Notes (Signed)
Pre visit review using our clinic review tool, if applicable. No additional management support is needed unless otherwise documented below in the visit note. 

## 2015-10-19 NOTE — Assessment & Plan Note (Signed)
Fracture of L1 with challenges in pain management. Currently maintained on oxycodone as needed. Movement and therapy is also limited secondary to weakness. Discussed in detail pain management and reducing fall risk and the fine balance which is a challenge. Continue current dosage of oxycodone as prescribed. Restart cyclobenzaprine. Recommend over the counter / non-pharmacological interventions including TENS units, ice/hot, and Thermacare heating pads. Follow up pending trial of over the counter / non-pharmacological interventions or if symptoms worsen or do not improve.

## 2015-10-19 NOTE — Patient Instructions (Addendum)
Thank you for choosing [facility].  Summary/Instructions:  Please continue to take your medications as prescribed.   Your prescription(s) have been submitted to your pharmacy or been printed and provided for you. Please take as directed and contact our office if you believe you are having problem(s) with the medication(s) or have any questions.  Please stop by the lab on the basement level of the building for your blood work. Your results will be released to MyChart (or called to you) after review, usually within 72 hours after test completion. If any changes need to be made, you will be notified at that same time.  If your symptoms worsen or fail to improve, please contact our office for further instruction, or in case of emergency go directly to the emergency room at the closest medical facility.     

## 2015-10-20 ENCOUNTER — Encounter: Payer: Self-pay | Admitting: Family

## 2015-10-20 LAB — CBC
HCT: 35.5 % — ABNORMAL LOW (ref 36.0–46.0)
HEMOGLOBIN: 11.5 g/dL — AB (ref 12.0–15.0)
MCHC: 32.4 g/dL (ref 30.0–36.0)
MCV: 84.6 fl (ref 78.0–100.0)
Platelets: 325 10*3/uL (ref 150.0–400.0)
RBC: 4.2 Mil/uL (ref 3.87–5.11)
RDW: 15.7 % — AB (ref 11.5–15.5)
WBC: 9 10*3/uL (ref 4.0–10.5)

## 2015-10-26 ENCOUNTER — Other Ambulatory Visit: Payer: Self-pay | Admitting: *Deleted

## 2015-10-26 ENCOUNTER — Telehealth: Payer: Self-pay | Admitting: *Deleted

## 2015-10-26 MED ORDER — APIXABAN 5 MG PO TABS: 5.0000 mg | ORAL_TABLET | Freq: Two times a day (BID) | ORAL | Status: DC

## 2015-10-27 ENCOUNTER — Other Ambulatory Visit: Payer: Self-pay | Admitting: *Deleted

## 2015-10-27 MED ORDER — APIXABAN 5 MG PO TABS: ORAL_TABLET | ORAL | Status: DC

## 2015-10-27 MED ORDER — APIXABAN 5 MG PO TABS: 5.0000 mg | ORAL_TABLET | Freq: Two times a day (BID) | ORAL | Status: DC

## 2015-10-30 ENCOUNTER — Other Ambulatory Visit: Payer: Self-pay | Admitting: Pharmacist Clinician (PhC)/ Clinical Pharmacy Specialist

## 2015-10-30 MED ORDER — APIXABAN 5 MG PO TABS: ORAL_TABLET | ORAL | Status: DC

## 2015-11-02 ENCOUNTER — Ambulatory Visit (HOSPITAL_COMMUNITY)
Admission: RE | Admit: 2015-11-02 | Discharge: 2015-11-02 | Disposition: A | Discharge status: Home / Self Care | Payer: Medicare HMO | Source: Ambulatory Visit | Attending: Internal Medicine | Admitting: Internal Medicine

## 2015-11-02 ENCOUNTER — Other Ambulatory Visit (HOSPITAL_BASED_OUTPATIENT_CLINIC_OR_DEPARTMENT_OTHER): Payer: Medicare HMO

## 2015-11-02 DIAGNOSIS — C3491 Malignant neoplasm of unspecified part of right bronchus or lung: Secondary | ICD-10-CM | POA: Diagnosis not present

## 2015-11-02 DIAGNOSIS — M4856XA Collapsed vertebra, not elsewhere classified, lumbar region, initial encounter for fracture: Secondary | ICD-10-CM | POA: Insufficient documentation

## 2015-11-02 DIAGNOSIS — C3411 Malignant neoplasm of upper lobe, right bronchus or lung: Secondary | ICD-10-CM | POA: Diagnosis present

## 2015-11-02 DIAGNOSIS — E279 Disorder of adrenal gland, unspecified: Secondary | ICD-10-CM | POA: Insufficient documentation

## 2015-11-02 DIAGNOSIS — M4854XA Collapsed vertebra, not elsewhere classified, thoracic region, initial encounter for fracture: Secondary | ICD-10-CM | POA: Diagnosis not present

## 2015-11-02 DIAGNOSIS — Z923 Personal history of irradiation: Secondary | ICD-10-CM | POA: Diagnosis not present

## 2015-11-02 LAB — COMPREHENSIVE METABOLIC PANEL
ALT: 13 U/L (ref 0–55)
ANION GAP: 9 meq/L (ref 3–11)
AST: 18 U/L (ref 5–34)
Albumin: 3.3 g/dL — ABNORMAL LOW (ref 3.5–5.0)
Alkaline Phosphatase: 66 U/L (ref 40–150)
BILIRUBIN TOTAL: 0.38 mg/dL (ref 0.20–1.20)
BUN: 16.8 mg/dL (ref 7.0–26.0)
CALCIUM: 9.8 mg/dL (ref 8.4–10.4)
CHLORIDE: 103 meq/L (ref 98–109)
CO2: 25 mEq/L (ref 22–29)
CREATININE: 1 mg/dL (ref 0.6–1.1)
EGFR: 54 mL/min/{1.73_m2} — ABNORMAL LOW (ref >90)
Glucose: 158 mg/dl — ABNORMAL HIGH (ref 70–140)
Potassium: 4.7 mEq/L (ref 3.5–5.1)
Sodium: 137 mEq/L (ref 136–145)
TOTAL PROTEIN: 7.2 g/dL (ref 6.4–8.3)

## 2015-11-02 LAB — CBC WITH DIFFERENTIAL/PLATELET
BASO%: 0.8 % (ref 0.0–2.0)
Basophils Absolute: 0.1 10*3/uL (ref 0.0–0.1)
EOS%: 1.1 % (ref 0.0–7.0)
Eosinophils Absolute: 0.1 10*3/uL (ref 0.0–0.5)
HEMATOCRIT: 38.2 % (ref 34.8–46.6)
HEMOGLOBIN: 12.2 g/dL (ref 11.6–15.9)
LYMPH#: 0.9 10*3/uL (ref 0.9–3.3)
LYMPH%: 14.1 % (ref 14.0–49.7)
MCH: 27.3 pg (ref 25.1–34.0)
MCHC: 31.9 g/dL (ref 31.5–36.0)
MCV: 85.6 fL (ref 79.5–101.0)
MONO#: 0.5 10*3/uL (ref 0.1–0.9)
MONO%: 7.2 % (ref 0.0–14.0)
NEUT%: 76.8 % (ref 38.4–76.8)
NEUTROS ABS: 4.9 10*3/uL (ref 1.5–6.5)
PLATELETS: 352 10*3/uL (ref 145–400)
RBC: 4.46 10*6/uL (ref 3.70–5.45)
RDW: 16 % — AB (ref 11.2–14.5)
WBC: 6.3 10*3/uL (ref 3.9–10.3)

## 2015-11-02 MED ORDER — IOHEXOL 300 MG/ML  SOLN
75.0000 mL | Freq: Once | INTRAMUSCULAR | Status: AC | PRN
  Administered 2015-11-02: 75 mL via INTRAVENOUS

## 2015-11-05 ENCOUNTER — Encounter: Payer: Self-pay | Admitting: Internal Medicine

## 2015-11-05 ENCOUNTER — Telehealth: Payer: Self-pay | Admitting: Internal Medicine

## 2015-11-05 ENCOUNTER — Ambulatory Visit (HOSPITAL_BASED_OUTPATIENT_CLINIC_OR_DEPARTMENT_OTHER): Payer: Medicare HMO | Admitting: Internal Medicine

## 2015-11-05 VITALS — BP 109/55 | HR 79 | Temp 97.8°F | Resp 18 | Ht 65.0 in | Wt 167.5 lb

## 2015-11-05 DIAGNOSIS — C3490 Malignant neoplasm of unspecified part of unspecified bronchus or lung: Secondary | ICD-10-CM | POA: Diagnosis not present

## 2015-11-05 DIAGNOSIS — E279 Disorder of adrenal gland, unspecified: Secondary | ICD-10-CM

## 2015-11-05 DIAGNOSIS — C3411 Malignant neoplasm of upper lobe, right bronchus or lung: Secondary | ICD-10-CM

## 2015-11-05 NOTE — Addendum Note (Signed)
Addended by: Ardeen Garland on: 11/05/2015 12:07 PM   Modules accepted: Orders, Medications

## 2015-11-05 NOTE — Telephone Encounter (Signed)
per pof to sch pt appt-gave ptr copy of avs-adv pt central sch will call to sch scan

## 2015-11-05 NOTE — Telephone Encounter (Signed)
per pof to sch pt appt-gave pt copy of avs-adv central sch will call to sch scan

## 2015-11-05 NOTE — Progress Notes (Signed)
[place] [phone]   [phone]  OFFICE PROGRESS NOTE  Mauricio Po, FNP [address] [place] [address]  DIAGNOSIS: Non-small cell carcinoma of lung, stage 3  Staging form: Lung, AJCC 7th Edition  Clinical stage from 04/30/2015: Stage IIIA (T1a, N2, M0) - Signed by Curt Bears, MD on 04/30/2015  PRIOR THERAPY: Concurrent chemoradiation with chemotherapy in the form of weekly carboplatin for an AUC of 2 and paclitaxel at 45 mg/m given concurrent with radiation. Last dose was given 06/18/2015 with partial response.  CURRENT THERAPY: Observation.  INTERVAL HISTORY: Pamela Mcguire 80 y.o. female returns to the clinic today for follow-up visit accompanied by her daughter.The patient is feeling fine today except for the persistent fatigue and weakness as well as recent cold symptoms. She has been observation since July 2016. She denied having any significant chest pain but has shortness breath with exertion with no significant cough or hemoptysis. She has no nausea or vomiting, no fever or chills. The patient had repeat CT scan of the chest performed recently and she is here for evaluation and discussion of her scan results.  MEDICAL HISTORY: Past Medical History  Diagnosis Date  . Hyperlipidemia   . Diverticulitis   . CAD (coronary artery disease)   . AF (atrial fibrillation) ([place])   . Hypertension   . Seasonal allergies   . Prediabetes   . Depression   . Shortness of breath dyspnea   . Anxiety   . Urinary frequency   . GERD (gastroesophageal reflux disease)   . Sleep apnea      dx'd 10/2014, has not received cpap yet (04/28/2015)  . Arthritis     "thighs, hips, arms" (04/28/2015)  . Breast cancer ([place])     right mastectomy  . Non-small cell carcinoma of lung, stage 3 ([place]) 04/30/2015    ALLERGIES:  has No Known Allergies.  MEDICATIONS:  Current Outpatient Prescriptions  Medication Sig Dispense Refill  . acetaminophen  (TYLENOL) 500 MG tablet Take 2 tablets (1,000 mg total) by mouth every 8 (eight) hours. 30 tablet 0  . albuterol (PROVENTIL HFA;VENTOLIN HFA) 108 (90 BASE) MCG/ACT inhaler Inhale 1 puff into the lungs every 6 (six) hours as needed for wheezing or shortness of breath.    Marland Kitchen apixaban (ELIQUIS) 5 MG TABS tablet Take 1 tablet twice daily 60 tablet 6  . budesonide-formoterol (SYMBICORT) 160-4.5 MCG/ACT inhaler Inhale 2 puffs into the lungs 2 (two) times daily as needed (For wheezing, wait 1 min between each puff).    Marland Kitchen buPROPion (WELLBUTRIN SR) 100 MG 12 hr tablet TAKE 1 TABLET(100 MG) BY MOUTH DAILY 90 tablet 0  . carvedilol (COREG) 3.125 MG tablet Take 1 tablet (3.125 mg total) by mouth 2 (two) times daily. 180 tablet 3  . Cholecalciferol (VITAMIN D PO) Take 1 tablet by mouth every morning.     . Cyanocobalamin (VITAMIN B-12 PO) Take 1 capsule by mouth every morning.    . cyclobenzaprine (FLEXERIL) 5 MG tablet Take 1 tablet (5 mg total) by mouth 3 (three) times daily as needed for muscle spasms. 10 tablet 0  . emollient (BIAFINE) cream Apply topically as needed. (Patient taking differently: Apply 1 application topically as needed (radiation burns). ) 454 g 0  . escitalopram (LEXAPRO) 10 MG tablet Take 1 tablet (10 mg total) by mouth daily. 30 tablet 0  . ezetimibe-simvastatin (VYTORIN) 10-40 MG per tablet Take 1 tablet by mouth daily. 90 tablet 3  .  feeding supplement (BOOST / RESOURCE BREEZE) LIQD Take 1 Container by mouth 3 (three) times daily between meals. 90 Container 0  . folic acid (FOLVITE) 1 MG tablet TAKE 1 TABLET BY MOUTH TWICE DAILY 180 tablet 0  . HYDROmorphone (DILAUDID) 2 MG tablet Take 1 tablet (2 mg total) by mouth every 8 (eight) hours as needed for severe pain. 20 tablet 0  . loratadine (CLARITIN) 10 MG tablet Take 10 mg by mouth at bedtime as needed for allergies.    Marland Kitchen LORazepam (ATIVAN) 1 MG tablet Take 1 tablet (1 mg total) by mouth every 12 (twelve) hours as needed for anxiety. 20  tablet 0  . magnesium oxide (MAG-OX) 400 (241.3 MG) MG tablet Take 1 tablet (400 mg total) by mouth 2 (two) times daily. 60 tablet 0  . oxyCODONE (OXY IR/ROXICODONE) 5 MG immediate release tablet Take 1-2 tablets (5-10 mg total) by mouth every 4 (four) hours as needed for moderate pain. 30 tablet 0  . pantoprazole (PROTONIX) 40 MG tablet Take 40 mg by mouth daily.    . polyethylene glycol (MIRALAX / GLYCOLAX) packet Take 17 g by mouth daily as needed for mild constipation. 14 each 0  . potassium chloride (K-DUR) 10 MEQ tablet Take 1 tablet (10 mEq total) by mouth daily. 90 tablet 0  . potassium chloride 20 MEQ TBCR Take 10 mEq by mouth daily. 30 tablet 0  . sucralfate (CARAFATE) 1 G tablet Take 1 tablet (1 g total) by mouth 4 (four) times daily -  with meals and at bedtime. 5 min before meals for radiation induced esophagitis 120 tablet 2   No current facility-administered medications for this visit.    SURGICAL HISTORY:  Past Surgical History  Procedure Laterality Date  . Cardioversion    . Tonsillectomy    . Colonoscopy    . Cesarean section  X 2  . Mastectomy Right   . Breast surgery    . Mediastinoscopy N/A 04/28/2015    Procedure: MEDIASTINOSCOPY;  Surgeon: [name], MD;  Location: [facility] OR;  Service: Thoracic;  Laterality: N/A;    REVIEW OF SYSTEMS:  A comprehensive review of systems was negative except for: Constitutional: positive for fatigue Respiratory: positive for dyspnea on exertion Gastrointestinal: positive for dyspepsia Musculoskeletal: positive for muscle weakness   PHYSICAL EXAMINATION: General appearance: alert, cooperative, fatigued and no distress Head: Normocephalic, without obvious abnormality, atraumatic Neck: no adenopathy, no JVD, supple, symmetrical, trachea midline and thyroid not enlarged, symmetric, no tenderness/mass/nodules Lymph nodes: Cervical, supraclavicular, and axillary nodes normal. Resp: clear to auscultation bilaterally Back: symmetric, no  curvature. ROM normal. No CVA tenderness. Cardio: regular rate and rhythm, S1, S2 normal, no murmur, click, rub or gallop GI: soft, non-tender; bowel sounds normal; no masses,  no organomegaly Extremities: extremities normal, atraumatic, no cyanosis or edema Neurologic: Alert and oriented X 3, normal strength and tone. Normal symmetric reflexes. Normal coordination and gait  ECOG PERFORMANCE STATUS: 2 - Symptomatic, <50% confined to bed  Blood pressure 109/55, pulse 79, temperature 97.8 F (36.6 C), temperature source Oral, resp. rate 18, height '5\' 5"'$  (1.651 m), weight 167 lb 8 oz (75.978 kg), SpO2 98 %.  LABORATORY DATA: Lab Results  Component Value Date   WBC 6.3 11/02/2015   HGB 12.2 11/02/2015   HCT 38.2 11/02/2015   MCV 85.6 11/02/2015   PLT 352 11/02/2015      Chemistry      Component Value Date/Time   NA 137 11/02/2015 1042  NA 135 10/19/2015 1505   K 4.7 11/02/2015 1042   K 4.8 10/19/2015 1505   CL 101 10/19/2015 1505   CO2 25 11/02/2015 1042   CO2 27 10/19/2015 1505   BUN 16.8 11/02/2015 1042   BUN 16 10/19/2015 1505   CREATININE 1.0 11/02/2015 1042   CREATININE 0.88 10/19/2015 1505   CREATININE 0.91 06/11/2014 1608      Component Value Date/Time   CALCIUM 9.8 11/02/2015 1042   CALCIUM 9.7 10/19/2015 1505   ALKPHOS 66 11/02/2015 1042   ALKPHOS 70 10/19/2015 1505   AST 18 11/02/2015 1042   AST 19 10/19/2015 1505   ALT 13 11/02/2015 1042   ALT 15 10/19/2015 1505   BILITOT 0.38 11/02/2015 1042   BILITOT 0.3 10/19/2015 1505       RADIOGRAPHIC STUDIES: Ct Chest W Contrast  11/02/2015  CLINICAL DATA:  Restaging lung cancer. Initial diagnosis June 2016. Fall with L1 fracture. Chemotherapy ongoing. Personal history of breast cancer diagnosed 2004. EXAM: CT CHEST WITH CONTRAST TECHNIQUE: Multidetector CT imaging of the chest was performed during intravenous contrast ad . Ministration. CONTRAST:  88m OMNIPAQUE IOHEXOL 300 MG/ML  SOLN COMPARISON:  CT 08/04/2015  FINDINGS: Mediastinum/Nodes: RIGHT mastectomy anatomy. Lymphadenectomy clips in the RIGHT axilla. No axillary or supraclavicular adenopathy. No mediastinal or hilar adenopathy. No pericardial fluid. Lungs/Pleura: Volume loss in the RIGHT hemi thorax. There is patchy consolidation in the RIGHT upper lobe with air bronchograms suggesting radiation change. No measurable nodularity. RIGHT lower lobe is clear. Upper abdomen: No focal hepatic lesion. Postcholecystectomy. Adrenal glands demonstrate a nodule in the LEFT adrenal gland measuring 11 mm (image 55, series 2) which not changed from CT of 08/04/2015. Nodule slightly thickened from 10 mm on PET-CT of 04/10/2015. Low-density lesion in the RIGHT kidney Musculoskeletal: Compression fracture at L1 and T12 again demonstrated. Is unchanged from MRI of 09/15/2015 IMPRESSION: *Consolidation in the RIGHT upper lobe likely represents radiation change. No evidence of local lung cancer recurrence. *No evidence of mediastinal metastatic disease. *Enlargement of the LEFT adrenal gland is increased from PET-CT scan of 04/10/19 16. Recommend attention on follow-up. *Stable compression fractures at T12 and L1. Electronically Signed   By: SSuzy BouchardM.D.   On: 11/02/2015 13:46    ASSESSMENT AND PLAN: This is a very pleasant 80years old white female with history of stage IIIA non-small cell lung cancer status post a course of concurrent chemoradiation with weekly carboplatin and paclitaxel with partial response. The patient has rough time tolerating her previous treatment with increasing fatigue and weakness as well as radiation induced esophagitis. Her recent CT scan of the chest showed no evidence for disease progression except for a slightly enlarging left adrenal gland. I discussed the scan results with the patient and her daughter. I recommended for her to continue on observation for now with repeat CT scan of the chest in 3 months for reevaluation of her disease and  close monitoring of the left adrenal lesion. She was advised to call immediately if she has any concerning symptoms in the interval. The patient voices understanding of current disease status and treatment options and is in agreement with the current care plan.  All questions were answered. The patient knows to call the clinic with any problems, questions or concerns. We can certainly see the patient much sooner if necessary.  Disclaimer: This note was dictated with voice recognition software. Similar sounding words can inadvertently be transcribed and may not be corrected upon review.

## 2015-11-07 ENCOUNTER — Other Ambulatory Visit: Payer: Self-pay | Admitting: Adult Health

## 2015-11-12 ENCOUNTER — Other Ambulatory Visit: Payer: Self-pay | Admitting: Adult Health

## 2015-11-14 ENCOUNTER — Other Ambulatory Visit: Payer: Self-pay | Admitting: Family

## 2015-12-02 ENCOUNTER — Other Ambulatory Visit: Payer: Self-pay | Admitting: Adult Health

## 2015-12-03 ENCOUNTER — Other Ambulatory Visit: Payer: Self-pay | Admitting: Family

## 2015-12-03 ENCOUNTER — Other Ambulatory Visit: Payer: Self-pay

## 2015-12-03 DIAGNOSIS — S32019D Unspecified fracture of first lumbar vertebra, subsequent encounter for fracture with routine healing: Secondary | ICD-10-CM

## 2015-12-03 DIAGNOSIS — C3411 Malignant neoplasm of upper lobe, right bronchus or lung: Secondary | ICD-10-CM

## 2015-12-03 MED ORDER — OXYCODONE HCL 5 MG PO TABS: 5.0000 mg | ORAL_TABLET | ORAL | Status: DC | PRN

## 2015-12-04 ENCOUNTER — Other Ambulatory Visit: Payer: Self-pay | Admitting: Family

## 2015-12-04 NOTE — Telephone Encounter (Signed)
Called pt dtr and informed that oxy refill was ready for pick up here at the office. Gave rx to Cecille Rubin and it was taken up front.

## 2015-12-04 NOTE — Telephone Encounter (Signed)
Please advise of refills.

## 2015-12-04 NOTE — Telephone Encounter (Signed)
Pt's daughter, [name], called request refill" Loratadine, Potassium, escitalopram and magnesium to be send to [facility] on [address]. Please help, and also make sure that Oxycodone is fax to [facility] on [address].

## 2015-12-07 ENCOUNTER — Other Ambulatory Visit: Payer: Self-pay | Admitting: Family

## 2015-12-07 ENCOUNTER — Other Ambulatory Visit: Payer: Self-pay

## 2015-12-07 MED ORDER — POTASSIUM CHLORIDE ER 10 MEQ PO TBCR: 10.0000 meq | EXTENDED_RELEASE_TABLET | Freq: Every day | ORAL | Status: DC

## 2015-12-07 MED ORDER — MAGNESIUM OXIDE 400 (241.3 MG) MG PO TABS: 1.0000 | ORAL_TABLET | Freq: Every day | ORAL | Status: DC

## 2015-12-07 MED ORDER — LORATADINE 10 MG PO TABS: 10.0000 mg | ORAL_TABLET | Freq: Every day | ORAL | Status: DC

## 2015-12-07 MED ORDER — ESCITALOPRAM OXALATE 10 MG PO TABS: 10.0000 mg | ORAL_TABLET | Freq: Every day | ORAL | Status: DC

## 2015-12-07 NOTE — Telephone Encounter (Signed)
Pt daughter is calling in again about these refills.  Lovena Le please call her today.  She is a little upset because these have not been refilled.    Best number [phone]

## 2015-12-13 ENCOUNTER — Other Ambulatory Visit: Payer: Self-pay | Admitting: Family

## 2015-12-13 ENCOUNTER — Other Ambulatory Visit: Payer: Self-pay | Admitting: Radiation Oncology

## 2015-12-15 ENCOUNTER — Telehealth: Payer: Self-pay | Admitting: Radiation Oncology

## 2015-12-15 NOTE — Telephone Encounter (Signed)
Per Dr. Johny Shears order called in Ativan 1 mg, qty 86, and no refills to [place], [place]. Phoned [name], patient's daughter, making her aware this was done. [name] verbalized understanding and expressed appreciation for the call.

## 2015-12-26 ENCOUNTER — Other Ambulatory Visit: Payer: Self-pay | Admitting: Family

## 2016-01-01 ENCOUNTER — Telehealth: Payer: Self-pay | Admitting: Pharmacist Clinician (PhC)/ Clinical Pharmacy Specialist

## 2016-01-01 NOTE — Telephone Encounter (Signed)
Spoke with representative from [place], who is working with patient to make medications more affordable and ease financial burden.  Patient and family concerned about high cost of Eliquis and Vytorin.  Explained how patient assistance program works for [employer], pt needs to spend 3% of annual income in calendar year to qualify for assistance.  Vytorin is soon to be generic, so price potentially will drop, but suggested that we could easily switch to different statin medication for cost savings.  Unfortunately we don't have recent cholesterol labs, as patient just moved back to the area.  Based on dosing (Vytorin 10/40) could recommend atorvastatin 40 or rosuvastatin 10-20.  She will share with family and they can make any decisions and call back to [place].

## 2016-01-05 ENCOUNTER — Other Ambulatory Visit: Payer: Self-pay | Admitting: Family

## 2016-01-10 ENCOUNTER — Other Ambulatory Visit: Payer: Self-pay | Admitting: Family

## 2016-01-11 ENCOUNTER — Other Ambulatory Visit: Payer: Self-pay | Admitting: Family

## 2016-01-14 ENCOUNTER — Ambulatory Visit (INDEPENDENT_AMBULATORY_CARE_PROVIDER_SITE_OTHER): Payer: Medicare HMO | Admitting: Podiatry

## 2016-01-14 ENCOUNTER — Encounter: Payer: Self-pay | Admitting: Podiatry

## 2016-01-14 VITALS — Resp 14

## 2016-01-14 DIAGNOSIS — B351 Tinea unguium: Secondary | ICD-10-CM | POA: Diagnosis not present

## 2016-01-14 DIAGNOSIS — M79676 Pain in unspecified toe(s): Secondary | ICD-10-CM | POA: Diagnosis not present

## 2016-01-14 NOTE — Progress Notes (Signed)
   Subjective:    Patient ID: Pamela Mcguire, female    DOB: 1935-01-04, 81 y.o.   MRN: [id number]  HPI of this patient presents to the office with chief complaint of painful big toenails on both big toes. Patient states that the nails have grown long and thick and are painful walking and wearing her shoes. She denies any drainage or infection to the nail. She presents the office for preventive foot care services    Review of Systems  HENT: Positive for hearing loss.   All other systems reviewed and are negative.      Objective:   Physical Exam GENERAL APPEARANCE: Alert, conversant. Appropriately groomed. No acute distress.  VASCULAR: Pedal pulses palpable at  [facility] and PT bilateral.  Capillary refill time is immediate to all digits,  Normal temperature gradient.  Digital hair growth is present bilateral  NEUROLOGIC: sensation is normal to 5.07 monofilament at 5/5 sites bilateral.  Light touch is intact bilateral, Muscle strength normal.  MUSCULOSKELETAL: acceptable muscle strength, tone and stability bilateral.  Intrinsic muscluature intact bilateral.  Rectus appearance of foot and digits noted bilateral.   DERMATOLOGIC: skin color, texture, and turgor are within normal limits.  No preulcerative lesions or ulcers  are seen, no interdigital maceration noted.  No open lesions present.  . No drainage noted.  NAILS  Thick disfigured discolored nails especially hallux toenails on both great toes.       Assessment & Plan:  Onychomycosis  B/L  IE  Debride nails  RTC 3 months  Mother and daughter were concerned that there was persistant pain.  Patient has not have her nails done in months and pain has been presents for 2 months.  I told them pain would soon resolve.  Soak at home.  RTC 3 months.   Gardiner Barefoot DPM

## 2016-01-30 ENCOUNTER — Other Ambulatory Visit: Payer: Self-pay | Admitting: Radiation Oncology

## 2016-02-01 ENCOUNTER — Other Ambulatory Visit (HOSPITAL_BASED_OUTPATIENT_CLINIC_OR_DEPARTMENT_OTHER): Payer: Medicare HMO

## 2016-02-01 ENCOUNTER — Telehealth: Payer: Self-pay | Admitting: Radiation Oncology

## 2016-02-01 ENCOUNTER — Ambulatory Visit (HOSPITAL_COMMUNITY)
Admission: RE | Admit: 2016-02-01 | Discharge: 2016-02-01 | Disposition: A | Discharge status: Home / Self Care | Payer: Medicare HMO | Source: Ambulatory Visit | Attending: Internal Medicine | Admitting: Internal Medicine

## 2016-02-01 DIAGNOSIS — C3411 Malignant neoplasm of upper lobe, right bronchus or lung: Secondary | ICD-10-CM | POA: Diagnosis not present

## 2016-02-01 DIAGNOSIS — E279 Disorder of adrenal gland, unspecified: Secondary | ICD-10-CM | POA: Insufficient documentation

## 2016-02-01 DIAGNOSIS — J8410 Pulmonary fibrosis, unspecified: Secondary | ICD-10-CM | POA: Insufficient documentation

## 2016-02-01 DIAGNOSIS — I2510 Atherosclerotic heart disease of native coronary artery without angina pectoris: Secondary | ICD-10-CM | POA: Diagnosis not present

## 2016-02-01 LAB — CBC WITH DIFFERENTIAL/PLATELET
BASO%: 0.5 % (ref 0.0–2.0)
Basophils Absolute: 0 10*3/uL (ref 0.0–0.1)
EOS ABS: 0.1 10*3/uL (ref 0.0–0.5)
EOS%: 0.8 % (ref 0.0–7.0)
HCT: 37.1 % (ref 34.8–46.6)
HEMOGLOBIN: 11.9 g/dL (ref 11.6–15.9)
LYMPH%: 13.7 % — AB (ref 14.0–49.7)
MCH: 27.4 pg (ref 25.1–34.0)
MCHC: 31.9 g/dL (ref 31.5–36.0)
MCV: 85.9 fL (ref 79.5–101.0)
MONO#: 0.5 10*3/uL (ref 0.1–0.9)
MONO%: 7.2 % (ref 0.0–14.0)
NEUT%: 77.8 % — ABNORMAL HIGH (ref 38.4–76.8)
NEUTROS ABS: 5.4 10*3/uL (ref 1.5–6.5)
Platelets: 295 10*3/uL (ref 145–400)
RBC: 4.32 10*6/uL (ref 3.70–5.45)
RDW: 14.1 % (ref 11.2–14.5)
WBC: 6.9 10*3/uL (ref 3.9–10.3)
lymph#: 0.9 10*3/uL (ref 0.9–3.3)

## 2016-02-01 LAB — COMPREHENSIVE METABOLIC PANEL
ALBUMIN: 3.6 g/dL (ref 3.5–5.0)
ALT: 15 U/L (ref 0–55)
ANION GAP: 10 meq/L (ref 3–11)
AST: 17 U/L (ref 5–34)
Alkaline Phosphatase: 59 U/L (ref 40–150)
BILIRUBIN TOTAL: 0.44 mg/dL (ref 0.20–1.20)
BUN: 20.2 mg/dL (ref 7.0–26.0)
CALCIUM: 9.5 mg/dL (ref 8.4–10.4)
CHLORIDE: 104 meq/L (ref 98–109)
CO2: 24 mEq/L (ref 22–29)
CREATININE: 0.9 mg/dL (ref 0.6–1.1)
EGFR: 58 mL/min/{1.73_m2} — ABNORMAL LOW (ref >90)
Glucose: 115 mg/dl (ref 70–140)
Potassium: 4.5 mEq/L (ref 3.5–5.1)
Sodium: 139 mEq/L (ref 136–145)
TOTAL PROTEIN: 7.2 g/dL (ref 6.4–8.3)

## 2016-02-01 MED ORDER — IOHEXOL 300 MG/ML  SOLN
75.0000 mL | Freq: Once | INTRAMUSCULAR | Status: AC | PRN
  Administered 2016-02-01: 75 mL via INTRAVENOUS

## 2016-02-01 NOTE — Telephone Encounter (Signed)
Called in Ativan script as directed by Dr. Tammi Klippel to [facility] on [address]. Phoned [name], patient's daughter, making her aware this was done.

## 2016-02-02 ENCOUNTER — Telehealth: Payer: Self-pay | Admitting: Family

## 2016-02-02 DIAGNOSIS — S32019D Unspecified fracture of first lumbar vertebra, subsequent encounter for fracture with routine healing: Secondary | ICD-10-CM

## 2016-02-02 DIAGNOSIS — C3411 Malignant neoplasm of upper lobe, right bronchus or lung: Secondary | ICD-10-CM

## 2016-02-02 MED ORDER — OXYCODONE HCL 5 MG PO TABS: 5.0000 mg | ORAL_TABLET | ORAL | Status: DC | PRN

## 2016-02-02 NOTE — Telephone Encounter (Signed)
Last refill was 12/03/15

## 2016-02-02 NOTE — Telephone Encounter (Signed)
Requesting refill on oxycontin

## 2016-02-02 NOTE — Telephone Encounter (Signed)
Medication printed and available for pick up.

## 2016-02-03 NOTE — Telephone Encounter (Signed)
Pt is aware.  

## 2016-02-04 ENCOUNTER — Ambulatory Visit (HOSPITAL_BASED_OUTPATIENT_CLINIC_OR_DEPARTMENT_OTHER): Payer: Medicare HMO | Admitting: Internal Medicine

## 2016-02-04 ENCOUNTER — Telehealth: Payer: Self-pay | Admitting: Internal Medicine

## 2016-02-04 ENCOUNTER — Encounter: Payer: Self-pay | Admitting: Internal Medicine

## 2016-02-04 ENCOUNTER — Other Ambulatory Visit: Payer: Self-pay | Admitting: *Deleted

## 2016-02-04 VITALS — BP 128/57 | HR 83 | Temp 98.2°F | Resp 18 | Ht 65.0 in | Wt 174.7 lb

## 2016-02-04 DIAGNOSIS — C3490 Malignant neoplasm of unspecified part of unspecified bronchus or lung: Secondary | ICD-10-CM | POA: Diagnosis not present

## 2016-02-04 DIAGNOSIS — E279 Disorder of adrenal gland, unspecified: Secondary | ICD-10-CM | POA: Diagnosis not present

## 2016-02-04 DIAGNOSIS — C3411 Malignant neoplasm of upper lobe, right bronchus or lung: Secondary | ICD-10-CM

## 2016-02-04 MED ORDER — UNABLE TO FIND: Status: DC

## 2016-02-04 NOTE — Progress Notes (Signed)
[place] [phone]   [phone]  OFFICE PROGRESS NOTE  Mauricio Po, FNP [address] [place] [address]  DIAGNOSIS: Non-small cell carcinoma of lung, stage 3  Staging form: Lung, AJCC 7th Edition  Clinical stage from 04/30/2015: Stage IIIA (T1a, N2, M0) - Signed by Curt Bears, MD on 04/30/2015  PRIOR THERAPY: Concurrent chemoradiation with chemotherapy in the form of weekly carboplatin for an AUC of 2 and paclitaxel at 45 mg/m given concurrent with radiation. Last dose was given 06/18/2015 with partial response.  CURRENT THERAPY: Observation.  INTERVAL HISTORY: Pamela Mcguire 81 y.o. female returns to the clinic today for follow-up visit accompanied by her daughter.The patient is feeling fine today except for difficulty swallowing at night time when she lays down. She is gaining weight. She has been observation since July 2016. She denied having any significant chest pain, shortness of breath, cough or hemoptysis. She has no nausea or vomiting, no fever or chills. The patient had repeat CT scan of the chest performed recently and she is here for evaluation and discussion of her scan results.  MEDICAL HISTORY: Past Medical History  Diagnosis Date  . Hyperlipidemia   . Diverticulitis   . CAD (coronary artery disease)   . AF (atrial fibrillation) ([place])   . Hypertension   . Seasonal allergies   . Prediabetes   . Depression   . Shortness of breath dyspnea   . Anxiety   . Urinary frequency   . GERD (gastroesophageal reflux disease)   . Sleep apnea      dx'd 10/2014, has not received cpap yet (04/28/2015)  . Arthritis     "thighs, hips, arms" (04/28/2015)  . Breast cancer ([place])     right mastectomy  . Non-small cell carcinoma of lung, stage 3 ([place]) 04/30/2015    ALLERGIES:  has No Known Allergies.  MEDICATIONS:  Current Outpatient Prescriptions  Medication Sig Dispense Refill  . acetaminophen (TYLENOL) 500 MG tablet Take 2  tablets (1,000 mg total) by mouth every 8 (eight) hours. 30 tablet 0  . albuterol (PROVENTIL HFA;VENTOLIN HFA) 108 (90 BASE) MCG/ACT inhaler Inhale 1 puff into the lungs every 6 (six) hours as needed for wheezing or shortness of breath.    Marland Kitchen apixaban (ELIQUIS) 5 MG TABS tablet Take 1 tablet twice daily 60 tablet 6  . budesonide-formoterol (SYMBICORT) 160-4.5 MCG/ACT inhaler Inhale 2 puffs into the lungs 2 (two) times daily as needed (For wheezing, wait 1 min between each puff).    Marland Kitchen buPROPion (WELLBUTRIN SR) 100 MG 12 hr tablet TAKE 1 TABLET(100 MG) BY MOUTH DAILY 90 tablet 0  . carvedilol (COREG) 3.125 MG tablet Take 1 tablet (3.125 mg total) by mouth 2 (two) times daily. 180 tablet 3  . Cholecalciferol (VITAMIN D PO) Take 1 tablet by mouth every morning.     . Cyanocobalamin (VITAMIN B-12 PO) Take 1 capsule by mouth every morning.    . cyclobenzaprine (FLEXERIL) 5 MG tablet Take 1 tablet (5 mg total) by mouth 3 (three) times daily as needed for muscle spasms. 10 tablet 0  . emollient (BIAFINE) cream Apply topically as needed. (Patient taking differently: Apply 1 application topically as needed (radiation burns). ) 454 g 0  . escitalopram (LEXAPRO) 10 MG tablet Take 1 tablet (10 mg total) by mouth daily. 30 tablet 0  . ezetimibe-simvastatin (VYTORIN) 10-40 MG per tablet Take 1 tablet by mouth daily. 90 tablet 3  . feeding supplement (BOOST /  RESOURCE BREEZE) LIQD Take 1 Container by mouth 3 (three) times daily between meals. 90 Container 0  . folic acid (FOLVITE) 1 MG tablet TAKE 1 TABLET BY MOUTH TWICE DAILY 60 tablet 0  . loratadine (CLARITIN) 10 MG tablet TAKE 1 TABLET BY MOUTH AT BEDTIME 90 tablet 1  . LORazepam (ATIVAN) 1 MG tablet TAKE 1 TABLET BY MOUTH EVERY 8 HOURS AS NEEDED FOR ANXIETY 45 tablet 0  . MAGNESIUM-OXIDE 400 (241.3 Mg) MG tablet TAKE 1 TABLET(400 MG) BY MOUTH DAILY 30 tablet 0  . mometasone-formoterol (DULERA) 100-5 MCG/ACT AERO Inhale into the lungs.    Marland Kitchen oxyCODONE (OXY  IR/ROXICODONE) 5 MG immediate release tablet Take 1-2 tablets (5-10 mg total) by mouth every 4 (four) hours as needed for moderate pain. 60 tablet 0  . pantoprazole (PROTONIX) 40 MG tablet Take 40 mg by mouth daily.    . polyethylene glycol (MIRALAX / GLYCOLAX) packet Take 17 g by mouth daily as needed for mild constipation. 14 each 0  . potassium chloride (K-DUR) 10 MEQ tablet TAKE 1 TABLET(10 MEQ) BY MOUTH DAILY 90 tablet 0   No current facility-administered medications for this visit.    SURGICAL HISTORY:  Past Surgical History  Procedure Laterality Date  . Cardioversion    . Tonsillectomy    . Colonoscopy    . Cesarean section  X 2  . Mastectomy Right   . Breast surgery    . Mediastinoscopy N/A 04/28/2015    Procedure: MEDIASTINOSCOPY;  Surgeon: [name], MD;  Location: [facility] OR;  Service: Thoracic;  Laterality: N/A;    REVIEW OF SYSTEMS:  A comprehensive review of systems was negative except for: Gastrointestinal: positive for dysphagia   PHYSICAL EXAMINATION: General appearance: alert, cooperative, fatigued and no distress Head: Normocephalic, without obvious abnormality, atraumatic Neck: no adenopathy, no JVD, supple, symmetrical, trachea midline and thyroid not enlarged, symmetric, no tenderness/mass/nodules Lymph nodes: Cervical, supraclavicular, and axillary nodes normal. Resp: clear to auscultation bilaterally Back: symmetric, no curvature. ROM normal. No CVA tenderness. Cardio: regular rate and rhythm, S1, S2 normal, no murmur, click, rub or gallop GI: soft, non-tender; bowel sounds normal; no masses,  no organomegaly Extremities: extremities normal, atraumatic, no cyanosis or edema Neurologic: Alert and oriented X 3, normal strength and tone. Normal symmetric reflexes. Normal coordination and gait  ECOG PERFORMANCE STATUS: 2 - Symptomatic, <50% confined to bed  Blood pressure 128/57, pulse 83, temperature 98.2 F (36.8 C), temperature source Oral, resp. rate 18,  height '5\' 5"'$  (1.651 m), weight 174 lb 11.2 oz (79.243 kg), SpO2 98 %.  LABORATORY DATA: Lab Results  Component Value Date   WBC 6.9 02/01/2016   HGB 11.9 02/01/2016   HCT 37.1 02/01/2016   MCV 85.9 02/01/2016   PLT 295 02/01/2016      Chemistry      Component Value Date/Time   NA 139 02/01/2016 1026   NA 135 10/19/2015 1505   K 4.5 02/01/2016 1026   K 4.8 10/19/2015 1505   CL 101 10/19/2015 1505   CO2 24 02/01/2016 1026   CO2 27 10/19/2015 1505   BUN 20.2 02/01/2016 1026   BUN 16 10/19/2015 1505   CREATININE 0.9 02/01/2016 1026   CREATININE 0.88 10/19/2015 1505   CREATININE 0.91 06/11/2014 1608      Component Value Date/Time   CALCIUM 9.5 02/01/2016 1026   CALCIUM 9.7 10/19/2015 1505   ALKPHOS 59 02/01/2016 1026   ALKPHOS 70 10/19/2015 1505   AST 17 02/01/2016 1026  AST 19 10/19/2015 1505   ALT 15 02/01/2016 1026   ALT 15 10/19/2015 1505   BILITOT 0.44 02/01/2016 1026   BILITOT 0.3 10/19/2015 1505       RADIOGRAPHIC STUDIES: Ct Chest W Contrast  02/01/2016  CLINICAL DATA:  Right lung cancer, radiation therapy completed. Chemotherapy in progress. EXAM: CT CHEST WITH CONTRAST TECHNIQUE: Multidetector CT imaging of the chest was performed during intravenous contrast administration. CONTRAST:  8m OMNIPAQUE IOHEXOL 300 MG/ML  SOLN COMPARISON:  11/02/2015. FINDINGS: Mediastinum/Nodes: Sub cm low-attenuation lesions are seen in the thyroid. Mediastinal lymph nodes measure up to 13 mm in the lower right paratracheal station, stable. No left hilar or axillary adenopathy. Surgical clips in the right axilla. Post treatment changes in the right hilum make evaluation for adenopathy difficult. Three-vessel coronary artery calcification. Heart size normal. No pericardial effusion. Lungs/Pleura: Patchy consolidation, bronchiectasis and volume loss in the right upper lobe extends to the right hilum and superior segment right lower lobe. Findings are slightly more organized than on  11/02/2015. Mild subpleural reticulation bilaterally. Mucoid impaction in the inferior lingula, as before. Trace right pleural fluid. Minimal adherent debris in the trachea. Upper abdomen: Visualized portions of the liver and right adrenal gland is unremarkable. Left adrenal nodule measures 11 mm, indeterminate. Low-attenuation lesions in the visualized portions of the kidneys measure up to 10 mm on the right, too small to characterize. Sub cm low-attenuation lesion in the spleen, stable. Pancreas is unremarkable. Small hiatal hernia. Musculoskeletal: No worrisome lytic or sclerotic lesions. Degenerative changes are seen in the spine. T12 and L1 compression deformities are unchanged. IMPRESSION: 1. Posttreatment changes in the upper right hemi thorax without evidence of recurrent or metastatic disease. 2. Mild subpleural pulmonary fibrosis. 3. Three-vessel coronary artery calcification. 4. Left adrenal nodule, stable from recent prior examination but slightly more prominent than on 04/10/2015. Continued attention on followup exams is warranted. Electronically Signed   By: MLorin PicketM.D.   On: 02/01/2016 12:12    ASSESSMENT AND PLAN: This is a very pleasant 81years old white female with history of stage IIIA non-small cell lung cancer status post a course of concurrent chemoradiation with weekly carboplatin and paclitaxel with partial response.  The recent CT scan of the chest showed no evidence for disease progression. The nodule in the left adrenal gland is a stable. I discussed the scan results with the patient and her daughter. I recommended for her to continue on observation with repeat CT scan of the chest in 3 months. The patient was given prescription and Voucher for cranial prosthesis. She was advised to call immediately if she has any concerning symptoms in the interval. The patient voices understanding of current disease status and treatment options and is in agreement with the current care  plan.  All questions were answered. The patient knows to call the clinic with any problems, questions or concerns. We can certainly see the patient much sooner if necessary.  Disclaimer: This note was dictated with voice recognition software. Similar sounding words can inadvertently be transcribed and may not be corrected upon review.

## 2016-02-04 NOTE — Telephone Encounter (Signed)
per pof to sch pt appt-gave pt copy of avs-adv central sch willc all to sch scan

## 2016-02-06 ENCOUNTER — Other Ambulatory Visit: Payer: Self-pay | Admitting: Family

## 2016-02-07 ENCOUNTER — Other Ambulatory Visit: Payer: Self-pay | Admitting: Family

## 2016-02-14 ENCOUNTER — Other Ambulatory Visit: Payer: Self-pay | Admitting: Family

## 2016-03-01 ENCOUNTER — Ambulatory Visit (INDEPENDENT_AMBULATORY_CARE_PROVIDER_SITE_OTHER): Payer: Medicare HMO | Admitting: Internal Medicine

## 2016-03-01 ENCOUNTER — Encounter: Payer: Self-pay | Admitting: Internal Medicine

## 2016-03-01 VITALS — BP 98/58 | HR 100 | Ht 66.0 in | Wt 177.4 lb

## 2016-03-01 DIAGNOSIS — I10 Essential (primary) hypertension: Secondary | ICD-10-CM

## 2016-03-01 DIAGNOSIS — I480 Paroxysmal atrial fibrillation: Secondary | ICD-10-CM

## 2016-03-01 MED ORDER — ATORVASTATIN CALCIUM 40 MG PO TABS: 40.0000 mg | ORAL_TABLET | Freq: Every day | ORAL | Status: DC

## 2016-03-01 MED ORDER — APIXABAN 5 MG PO TABS: ORAL_TABLET | ORAL | Status: DC

## 2016-03-01 NOTE — Patient Instructions (Addendum)
Your physician has recommended you make the following change in your medication.. . 1. STOP vytorin  2. START atorvastatin '40mg'$  once daily  Dr. Debara Pickett does NOT advise taking advil - increases bleeding risk Dr. Debara Pickett said you can take benadryl (with caution - risk of hallucinations) if needed for sleep  Your physician wants you to follow-up in: 6 months with Dr. Debara Pickett. You will receive a reminder letter in the mail two months in advance. If you don't receive a letter, please call our office to schedule the follow-up appointment.  Samples eliquis '5mg'$  - 2 boxes - provided to patient  Provided patient/daughter with patient assistance paperwork for Eliquis

## 2016-03-01 NOTE — Progress Notes (Signed)
OFFICE NOTE  Chief Complaint:  Mild shortness of breath  Primary Care Physician: Mauricio Po, FNP  HPI:  Pamela Mcguire is a pleasant 81 year old female who recently moved from the [place] area back to [place]. She is here today to establish cardiology care. She apparently was recently hospitalized and quite ill in the intensive care unit. She was suffering from diverticulitis and went into atrial fibrillation. She does not recall many of the events however does report that she was cardioverted and started on Rythmol. She must is seen a cardiologist in the hospital however did not have cardiology followup. She has been maintained on this medication and is in sinus rhythm today. She was not started on anticoagulation which is very unusual. She does have a history of hypertension, age greater than 57 and dyslipidemia. Her CHADSVASC score is 2, therefore, long-term anticoagulation is indicated. There are also some discrepancies in her medications. It appears that she has 3 different cholesterol medications.  She denies any chest pain with exertion but does get short of breath. She denies any palpitations, presyncope or syncopal episodes. She reports that she never had a stress test. She denies suffering an MI in the hospital.  Pamela Mcguire returns today for followup. I'm pleased to report her nuclear stress test was negative for ischemia. She has since started Eliquis and is tolerating it well without bleeding problems. We simplified her cholesterol medications and she is currently on Vytorin. She does report poor sleep at night and napping during the day. She says she has to urinate several times at night. She is noted to have a history of snoring but is unclear whether she has any true apnea. She often has trouble falling asleep as well as staying asleep.  I saw Pamela Mcguire back in the office today. She is reporting some mild positional dizziness. She also reports of chest pain when laying  down at night. Although she recently had a stress test as above which was negative. She continues to be fatigued throughout the day. She did undergo a sleep study which was markedly abnormal demonstrating an AHI of close to 50. She is scheduled to have a CPAP titration on March 28. I think this will make a significant difference in her symptoms. She reports no recurrence of her atrial fibrillation and is off Rythmol.  Pamela Mcguire returns today in the office. She denies any recurrent A. fib and is in a sinus rhythm today. There are occasional PVCs. She is actually off of all of her blood pressure medicine due to hypotension. She's had significant weight loss. She's been diagnosed with a lung cancer and is undergone chemotherapy. She apparently said all the chemotherapy that she can tolerate however the tumor is not resolved, but has shrunk. She short of breath most likely related to this. She continues on Eliquis without any bleeding problems. She is intolerant to CPAP despite her severe obstructive sleep apnea due to anxiety. She was previously on Xanax however is out of that today. I advised her that I did not prescribe benzodiazepine's or narcotics and that she should seek out her primary care provider for this.  I had the pleasure of seeing Pamela Mcguire back today in the office. Overall she reports she is doing better and has completed her chemotherapy. She does get some mild shortness of breath which is unchanged. She is describing some chest discomfort however it's mostly when she lays down at night and not with exertion. This is not worsening  is been a problem for several months. She sees to be tolerating Eliquis without bleeding problems. She is on low-dose carvedilol for rate control after she was taken off of Rythmol. Blood pressure is a little low today however in general she has had blood pressures in the 120s and is been asymptomatic. Daughter is concerned about the cost of Vytorin since she is on  Medicare with a supplemental. Previously she had taken Lipitor without problems. Also reportedly she is taking ibuprofen with diphenhydramine (Advil PM) for sleep. I advised her that this is not a good idea based on the fact that ibuprofen may increase her bleeding risk on Eliquis.  PMHx:  Past Medical History  Diagnosis Date  . Hyperlipidemia   . Diverticulitis   . CAD (coronary artery disease)   . AF (atrial fibrillation) ([place])   . Hypertension   . Seasonal allergies   . Prediabetes   . Depression   . Shortness of breath dyspnea   . Anxiety   . Urinary frequency   . GERD (gastroesophageal reflux disease)   . Sleep apnea      dx'd 10/2014, has not received cpap yet (04/28/2015)  . Arthritis     "thighs, hips, arms" (04/28/2015)  . Breast cancer ([place])     right mastectomy  . Non-small cell carcinoma of lung, stage 3 ([place]) 04/30/2015    Past Surgical History  Procedure Laterality Date  . Cardioversion    . Tonsillectomy    . Colonoscopy    . Cesarean section  X 2  . Mastectomy Right   . Breast surgery    . Mediastinoscopy N/A 04/28/2015    Procedure: MEDIASTINOSCOPY;  Surgeon: [name], MD;  Location: [facility];  Service: Thoracic;  Laterality: N/A;    FAMHx:  No family history on file.  SOCHx:   reports that she quit smoking about 23 years ago. Her smoking use included Cigarettes. She has a 35 pack-year smoking history. She has never used smokeless tobacco. She reports that she drinks about 5.4 oz of alcohol per week. She reports that she does not use illicit drugs.  ALLERGIES:  No Known Allergies  ROS: A comprehensive review of systems was negative except for: Constitutional: positive for insomnia and daytime hypersomnolence Respiratory: positive for dyspnea on exertion Behavioral/Psych: positive for anxiety  HOME MEDS: Current Outpatient Prescriptions  Medication Sig Dispense Refill  . acetaminophen (TYLENOL) 500 MG tablet Take 1,000 mg by mouth every 8 (eight)  hours as needed.    Marland Kitchen albuterol (PROVENTIL HFA;VENTOLIN HFA) 108 (90 BASE) MCG/ACT inhaler Inhale 1 puff into the lungs every 6 (six) hours as needed for wheezing or shortness of breath.    Marland Kitchen apixaban (ELIQUIS) 5 MG TABS tablet Take 1 tablet twice daily 180 tablet 3  . budesonide-formoterol (SYMBICORT) 160-4.5 MCG/ACT inhaler Inhale 2 puffs into the lungs 2 (two) times daily as needed (For wheezing, wait 1 min between each puff).    Marland Kitchen buPROPion (WELLBUTRIN SR) 100 MG 12 hr tablet TAKE 1 TABLET(100 MG) BY MOUTH DAILY 90 tablet 0  . carvedilol (COREG) 3.125 MG tablet Take 1 tablet (3.125 mg total) by mouth 2 (two) times daily. 180 tablet 3  . Cholecalciferol (VITAMIN D PO) Take 1 tablet by mouth every morning.     . Cyanocobalamin (VITAMIN B-12 PO) Take 1 capsule by mouth every morning.    . diphenhydramine-acetaminophen (TYLENOL PM) 25-500 MG TABS tablet Take 1 tablet by mouth at bedtime as needed.    Marland Kitchen  escitalopram (LEXAPRO) 10 MG tablet Take 1 tablet (10 mg total) by mouth daily. 30 tablet 0  . feeding supplement (BOOST / RESOURCE BREEZE) LIQD Take 1 Container by mouth 3 (three) times daily between meals. 90 Container 0  . folic acid (FOLVITE) 1 MG tablet TAKE 1 TABLET BY MOUTH TWICE DAILY 60 tablet 0  . loratadine (CLARITIN) 10 MG tablet TAKE 1 TABLET BY MOUTH AT BEDTIME 90 tablet 1  . LORazepam (ATIVAN) 1 MG tablet TAKE 1 TABLET BY MOUTH EVERY 8 HOURS AS NEEDED FOR ANXIETY 45 tablet 0  . MAGNESIUM-OXIDE 400 (241.3 Mg) MG tablet TAKE 1 TABLET(400 MG) BY MOUTH DAILY 30 tablet 0  . mometasone-formoterol (DULERA) 100-5 MCG/ACT AERO Inhale into the lungs.    Marland Kitchen oxyCODONE (OXY IR/ROXICODONE) 5 MG immediate release tablet Take 1-2 tablets (5-10 mg total) by mouth every 4 (four) hours as needed for moderate pain. 60 tablet 0  . pantoprazole (PROTONIX) 40 MG tablet TAKE 1 TABLET BY MOUTH EVERY DAY 30 tablet 0  . UNABLE TO FIND Med Name: Wig 1 each 0  . atorvastatin (LIPITOR) 40 MG tablet Take 1 tablet  (40 mg total) by mouth daily. 90 tablet 3   No current facility-administered medications for this visit.    LABS/IMAGING: No results found for this or any previous visit (from the past 48 hour(s)). No results found.  VITALS: BP 98/58 mmHg  Pulse 100  Ht '5\' 6"'$  (1.676 m)  Wt 177 lb 6.4 oz (80.468 kg)  BMI 28.65 kg/m2  EXAM: General appearance: alert and mild distress Neck: no carotid bruit and no JVD Lungs: diminished breath sounds bilaterally Heart: regular rate and rhythm, S1, S2 normal, no murmur, click, rub or gallop Abdomen: soft, non-tender; bowel sounds normal; no masses,  no organomegaly Extremities: extremities normal, atraumatic, no cyanosis or edema Pulses: 2+ and symmetric Skin: Pale, warm, dry Neurologic: Mental status: Alert, oriented, thought content appropriate Psych: Appears fatigued  EKG: Sinus rhythm with PACs at 100  ASSESSMENT: 1. Paroxysmal atrial fibrillation-maintaining sinus rhythm 2. Hypertension- off of medications due to hypotension 3. Dyslipidemia on Vytorin 4. Dyspnea on exertion - newly diagnosed lung cancer  5. Relative bradycardia-fatigue 6. Insomnia/somnolence - severe sleep apnea, intolerant to CPAP due to anxiety  PLAN: 1.   Mrs. Amaral seems to be doing better and is finished her treatments for lung cancer. Cost is a concern on Vytorin therefore will switch it to Lipitor 40 mg daily. We will go ahead and help her apply for patient assistance with Eliquis. I've advised her to his continue Advil PM and use Benadryl as needed for sleep, with being mindful that Benadryl can cause hallucinations and problems in the elderly. Her daughter then reported that she had recently had some auditory hallucinations. This should be further explored with her primary care provider. Plan to see her back in 6 months.  Pixie Casino, MD, [facility] Attending Cardiologist [place] 03/01/2016, 1:00 PM

## 2016-03-11 ENCOUNTER — Other Ambulatory Visit: Payer: Self-pay | Admitting: Radiation Oncology

## 2016-03-11 ENCOUNTER — Other Ambulatory Visit: Payer: Self-pay | Admitting: Family

## 2016-03-14 ENCOUNTER — Telehealth: Payer: Self-pay | Admitting: Radiation Oncology

## 2016-03-14 NOTE — Telephone Encounter (Signed)
Phoned [name], patient's daughter, to confirm pharmacy. Per Dr. Johny Shears order called in Ativan 1 mg, qty 65 and no refills to [facility], [place]. [name] understands the script should be ready for pick up in the hour.

## 2016-03-23 ENCOUNTER — Other Ambulatory Visit: Payer: Self-pay | Admitting: Family

## 2016-03-31 ENCOUNTER — Telehealth: Payer: Self-pay

## 2016-03-31 NOTE — Telephone Encounter (Signed)
Pt's daughter dropped off paperwork for Respite care for pt.  After discussion with Terri Piedra FNP, pt needs ROV to completed.  Detailed message left on daughter's VM explaining same and paperwork given too Greg's assistant Peyton Najjar pending appt.

## 2016-04-07 ENCOUNTER — Telehealth: Payer: Self-pay | Admitting: *Deleted

## 2016-04-07 DIAGNOSIS — S32019D Unspecified fracture of first lumbar vertebra, subsequent encounter for fracture with routine healing: Secondary | ICD-10-CM

## 2016-04-07 DIAGNOSIS — C3411 Malignant neoplasm of upper lobe, right bronchus or lung: Secondary | ICD-10-CM

## 2016-04-07 NOTE — Telephone Encounter (Signed)
Left msg on triage Wed afternoon mom is needing refill on her Oxycodone. Have appt to see Marya Amsler 5/17, but she is needing refill ...[name]

## 2016-04-08 MED ORDER — OXYCODONE HCL 5 MG PO TABS: 5.0000 mg | ORAL_TABLET | ORAL | Status: DC | PRN

## 2016-04-08 NOTE — Telephone Encounter (Signed)
Medication available for pick up.

## 2016-04-08 NOTE — Telephone Encounter (Signed)
Called pt daughter no answer LMOM rx ready for pick-up...[name]

## 2016-04-08 NOTE — Telephone Encounter (Signed)
Pls advise on msg below.../lmb 

## 2016-04-13 ENCOUNTER — Encounter: Payer: Self-pay | Admitting: Family

## 2016-04-13 ENCOUNTER — Other Ambulatory Visit: Payer: Self-pay | Admitting: Family

## 2016-04-13 ENCOUNTER — Ambulatory Visit (INDEPENDENT_AMBULATORY_CARE_PROVIDER_SITE_OTHER): Payer: Medicare HMO | Admitting: Family

## 2016-04-13 VITALS — BP 148/84 | HR 87 | Temp 98.4°F | Resp 14 | Ht 66.0 in | Wt 179.0 lb

## 2016-04-13 DIAGNOSIS — F32A Depression, unspecified: Secondary | ICD-10-CM

## 2016-04-13 DIAGNOSIS — C3411 Malignant neoplasm of upper lobe, right bronchus or lung: Secondary | ICD-10-CM

## 2016-04-13 DIAGNOSIS — Z0289 Encounter for other administrative examinations: Secondary | ICD-10-CM | POA: Diagnosis not present

## 2016-04-13 DIAGNOSIS — F329 Major depressive disorder, single episode, unspecified: Secondary | ICD-10-CM

## 2016-04-13 MED ORDER — MOMETASONE FURO-FORMOTEROL FUM 100-5 MCG/ACT IN AERO: 2.0000 | INHALATION_SPRAY | Freq: Two times a day (BID) | RESPIRATORY_TRACT | Status: DC

## 2016-04-13 MED ORDER — ESCITALOPRAM OXALATE 10 MG PO TABS: 10.0000 mg | ORAL_TABLET | Freq: Every day | ORAL | Status: DC

## 2016-04-13 NOTE — Patient Instructions (Addendum)
Thank you for choosing [facility].  Summary/Instructions:  Please continue to take your medication as prescribed.  STOP taking your magneisum and folic acid.   STOP taking the potassium.   Your prescription(s) have been submitted to your pharmacy or been printed and provided for you. Please take as directed and contact our office if you believe you are having problem(s) with the medication(s) or have any questions.  If your symptoms worsen or fail to improve, please contact our office for further instruction, or in case of emergency go directly to the emergency room at the closest medical facility.

## 2016-04-13 NOTE — Assessment & Plan Note (Signed)
Depression appears stable with current regimen and no adverse side effects or suicidal ideations. Continue current dosage of Lexapro.

## 2016-04-13 NOTE — Progress Notes (Signed)
Subjective:    Patient ID: Pamela Mcguire, female    DOB: Jun 10, 1935, 81 y.o.   MRN: [id number]  Chief Complaint  Patient presents with  . Paper work    has form that needs to be filled out for adult day care, daughter wants to know about getting off magnesium and potassium, she is having diarrhea    HPI:  Pamela Mcguire is a 81 y.o. female who  has a past medical history of Hyperlipidemia; Diverticulitis; CAD (coronary artery disease); AF (atrial fibrillation) ([place]); Hypertension; Seasonal allergies; Prediabetes; Depression; Shortness of breath dyspnea; Anxiety; Urinary frequency; GERD (gastroesophageal reflux disease); Sleep apnea; Arthritis; Breast cancer ([place]); and Non-small cell carcinoma of lung, stage 3 ([place]) (04/30/2015). and presents today for a follow up office visit.   1.) Form Completion -  Pamela Mcguire is applying to start respite care through the [place] [facility] and requires a medical form to be filled to complete her eligibility.   2.) Depression - Currently maintained on Lexapro and lorazepam. Reports taking the medications as prescribed and denies adverse side effects or suicidal ideations. Notes that her mood is stable.   No Known Allergies   Current Outpatient Prescriptions on File Prior to Visit  Medication Sig Dispense Refill  . acetaminophen (TYLENOL) 500 MG tablet Take 1,000 mg by mouth every 8 (eight) hours as needed.    Marland Kitchen albuterol (PROVENTIL HFA;VENTOLIN HFA) 108 (90 BASE) MCG/ACT inhaler Inhale 1 puff into the lungs every 6 (six) hours as needed for wheezing or shortness of breath.    Marland Kitchen apixaban (ELIQUIS) 5 MG TABS tablet Take 1 tablet twice daily 180 tablet 3  . atorvastatin (LIPITOR) 40 MG tablet Take 1 tablet (40 mg total) by mouth daily. 90 tablet 3  . budesonide-formoterol (SYMBICORT) 160-4.5 MCG/ACT inhaler Inhale 2 puffs into the lungs 2 (two) times daily as needed (For wheezing, wait 1 min between each puff).    Marland Kitchen buPROPion (WELLBUTRIN SR) 100 MG 12  hr tablet TAKE 1 TABLET(100 MG) BY MOUTH DAILY 90 tablet 1  . carvedilol (COREG) 3.125 MG tablet Take 1 tablet (3.125 mg total) by mouth 2 (two) times daily. 180 tablet 3  . Cholecalciferol (VITAMIN D PO) Take 1 tablet by mouth every morning.     . Cyanocobalamin (VITAMIN B-12 PO) Take 1 capsule by mouth every morning.    . diphenhydramine-acetaminophen (TYLENOL PM) 25-500 MG TABS tablet Take 1 tablet by mouth at bedtime as needed.    . feeding supplement (BOOST / RESOURCE BREEZE) LIQD Take 1 Container by mouth 3 (three) times daily between meals. 90 Container 0  . loratadine (CLARITIN) 10 MG tablet TAKE 1 TABLET BY MOUTH AT BEDTIME 90 tablet 1  . LORazepam (ATIVAN) 1 MG tablet TAKE 1 TABLET BY MOUTH EVERY 8 HOURS AS NEEDED FOR ANXIETY 45 tablet 0  . oxyCODONE (OXY IR/ROXICODONE) 5 MG immediate release tablet Take 1-2 tablets (5-10 mg total) by mouth every 4 (four) hours as needed for moderate pain. 60 tablet 0  . pantoprazole (PROTONIX) 40 MG tablet TAKE 1 TABLET BY MOUTH EVERY DAY 30 tablet 0  . UNABLE TO FIND Med Name: Wig 1 each 0   No current facility-administered medications on file prior to visit.     Review of Systems  Constitutional: Negative for fever and chills.  Respiratory: Negative for chest tightness, shortness of breath and wheezing.   Cardiovascular: Negative for chest pain, palpitations and leg swelling.  Gastrointestinal: Positive for diarrhea.  Neurological: Negative  for weakness and headaches.      Objective:    BP 148/84 mmHg  Pulse 87  Temp(Src) 98.4 F (36.9 C) (Oral)  Resp 14  Ht '5\' 6"'$  (1.676 m)  Wt 179 lb (81.194 kg)  BMI 28.91 kg/m2  SpO2 95% Nursing note and vital signs reviewed.  Physical Exam  Constitutional: She is oriented to person, place, and time. She appears well-developed and well-nourished. No distress.  Cardiovascular: Normal rate, regular rhythm, normal heart sounds and intact distal pulses.   Pulmonary/Chest: Effort normal and breath  sounds normal.  Neurological: She is alert and oriented to person, place, and time.  Skin: Skin is warm and dry.  Psychiatric: She has a normal mood and affect. Her behavior is normal. Judgment and thought content normal.       Assessment & Plan:   Problem List Items Addressed This Visit      Respiratory   Adenocarcinoma of upper lobe of right lung   Relevant Medications   mometasone-formoterol (DULERA) 100-5 MCG/ACT AERO     Other   Depression - Primary    Depression appears stable with current regimen and no adverse side effects or suicidal ideations. Continue current dosage of Lexapro.      Relevant Medications   escitalopram (LEXAPRO) 10 MG tablet    Other Visit Diagnoses    Encounter for completion of form with patient            I have discontinued Pamela Mcguire's MAGNESIUM-OXIDE, folic acid, and potassium chloride. I have also changed her mometasone-formoterol. Additionally, I am having her maintain her Cholecalciferol (VITAMIN D PO), Cyanocobalamin (VITAMIN B-12 PO), albuterol, feeding supplement, carvedilol, budesonide-formoterol, loratadine, UNABLE TO FIND, acetaminophen, diphenhydramine-acetaminophen, atorvastatin, apixaban, LORazepam, pantoprazole, buPROPion, oxyCODONE, and escitalopram.   Meds ordered this encounter  Medications  . DISCONTD: escitalopram (LEXAPRO) 10 MG tablet    Sig: Take 1 tablet (10 mg total) by mouth daily.    Dispense:  90 tablet    Refill:  1    Order Specific Question:  Supervising Provider    Answer:  Pricilla Holm A [[id number]]  . mometasone-formoterol (DULERA) 100-5 MCG/ACT AERO    Sig: Inhale 2 puffs into the lungs 2 (two) times daily.    Dispense:  13 g    Refill:  2    Order Specific Question:  Supervising Provider    Answer:  Pricilla Holm A [[id number]]  . escitalopram (LEXAPRO) 10 MG tablet    Sig: Take 1 tablet (10 mg total) by mouth daily.    Dispense:  90 tablet    Refill:  1    Order Specific Question:  Supervising  Provider    Answer:  Pricilla Holm A [[id number]]     Follow-up: Return in about 3 months (around 07/14/2016).  Mauricio Po, FNP

## 2016-04-14 ENCOUNTER — Encounter: Payer: Self-pay | Admitting: Podiatry

## 2016-04-14 ENCOUNTER — Ambulatory Visit (INDEPENDENT_AMBULATORY_CARE_PROVIDER_SITE_OTHER): Payer: Medicare HMO | Admitting: Podiatry

## 2016-04-14 DIAGNOSIS — M79676 Pain in unspecified toe(s): Secondary | ICD-10-CM

## 2016-04-14 DIAGNOSIS — B351 Tinea unguium: Secondary | ICD-10-CM | POA: Diagnosis not present

## 2016-04-14 NOTE — Progress Notes (Signed)
   Subjective:    Patient ID: Pamela Mcguire, female    DOB: September 20, 1935, 81 y.o.   MRN: [id number]  HPI of this patient presents to the office with chief complaint of painful big toenails on both big toes. Patient states that the nails have grown long and thick and are painful walking and wearing her shoes. She denies any drainage or infection to the nail. She presents the office for preventive foot care services    Review of Systems  HENT: Positive for hearing loss.   All other systems reviewed and are negative.      Objective:   Physical Exam GENERAL APPEARANCE: Alert, conversant. Appropriately groomed. No acute distress.  VASCULAR: Pedal pulses palpable at  [facility] and PT bilateral.  Capillary refill time is immediate to all digits,  Normal temperature gradient.  Digital hair growth is present bilateral  NEUROLOGIC: sensation is normal to 5.07 monofilament at 5/5 sites bilateral.  Light touch is intact bilateral, Muscle strength normal.  MUSCULOSKELETAL: acceptable muscle strength, tone and stability bilateral.  Intrinsic muscluature intact bilateral.  Rectus appearance of foot and digits noted bilateral.   DERMATOLOGIC: skin color, texture, and turgor are within normal limits.  No preulcerative lesions or ulcers  are seen, no interdigital maceration noted.  No open lesions present.  . No drainage noted.  NAILS  Thick disfigured discolored nails especially hallux toenails on both great toes.       Assessment & Plan:  Onychomycosis  B/L  IE  Debride nails     RTC 3 months.   Gardiner Barefoot DPM

## 2016-04-17 ENCOUNTER — Other Ambulatory Visit: Payer: Self-pay | Admitting: Family

## 2016-04-18 ENCOUNTER — Telehealth: Payer: Self-pay | Admitting: Family

## 2016-04-18 NOTE — Telephone Encounter (Signed)
[facility] at [place] is advising that they did not receive the protonix or dulera because they are waiting on PA. Please follow up

## 2016-04-20 NOTE — Telephone Encounter (Signed)
Called pharmacy. They stated that the protonix was covered but they will resend the PA for dulera.

## 2016-04-25 ENCOUNTER — Other Ambulatory Visit: Payer: Self-pay | Admitting: Radiation Oncology

## 2016-04-29 ENCOUNTER — Other Ambulatory Visit: Payer: Self-pay

## 2016-04-29 MED ORDER — FLUTICASONE FUROATE-VILANTEROL 100-25 MCG/INH IN AEPB: 1.0000 | INHALATION_SPRAY | Freq: Every day | RESPIRATORY_TRACT | Status: DC

## 2016-05-02 ENCOUNTER — Other Ambulatory Visit (HOSPITAL_BASED_OUTPATIENT_CLINIC_OR_DEPARTMENT_OTHER): Payer: Medicare HMO

## 2016-05-02 ENCOUNTER — Telehealth: Payer: Self-pay | Admitting: Medical Oncology

## 2016-05-02 DIAGNOSIS — C3411 Malignant neoplasm of upper lobe, right bronchus or lung: Secondary | ICD-10-CM

## 2016-05-02 LAB — COMPREHENSIVE METABOLIC PANEL
ALBUMIN: 3.3 g/dL — AB (ref 3.5–5.0)
ALK PHOS: 59 U/L (ref 40–150)
ALT: 11 U/L (ref 0–55)
AST: 13 U/L (ref 5–34)
Anion Gap: 6 mEq/L (ref 3–11)
BUN: 18.5 mg/dL (ref 7.0–26.0)
CO2: 29 meq/L (ref 22–29)
Calcium: 9.1 mg/dL (ref 8.4–10.4)
Chloride: 105 mEq/L (ref 98–109)
Creatinine: 1 mg/dL (ref 0.6–1.1)
EGFR: 52 mL/min/{1.73_m2} — ABNORMAL LOW (ref >90)
GLUCOSE: 140 mg/dL (ref 70–140)
POTASSIUM: 4.2 meq/L (ref 3.5–5.1)
SODIUM: 140 meq/L (ref 136–145)
TOTAL PROTEIN: 6.9 g/dL (ref 6.4–8.3)
Total Bilirubin: 0.35 mg/dL (ref 0.20–1.20)

## 2016-05-02 LAB — CBC WITH DIFFERENTIAL/PLATELET
BASO%: 0.3 % (ref 0.0–2.0)
Basophils Absolute: 0 10*3/uL (ref 0.0–0.1)
EOS%: 0.6 % (ref 0.0–7.0)
Eosinophils Absolute: 0 10*3/uL (ref 0.0–0.5)
HCT: 35.8 % (ref 34.8–46.6)
HEMOGLOBIN: 11.6 g/dL (ref 11.6–15.9)
LYMPH%: 9.3 % — ABNORMAL LOW (ref 14.0–49.7)
MCH: 27.8 pg (ref 25.1–34.0)
MCHC: 32.3 g/dL (ref 31.5–36.0)
MCV: 86 fL (ref 79.5–101.0)
MONO#: 0.5 10*3/uL (ref 0.1–0.9)
MONO%: 6.9 % (ref 0.0–14.0)
NEUT%: 82.9 % — ABNORMAL HIGH (ref 38.4–76.8)
NEUTROS ABS: 6.1 10*3/uL (ref 1.5–6.5)
Platelets: 321 10*3/uL (ref 145–400)
RBC: 4.16 10*6/uL (ref 3.70–5.45)
RDW: 14.5 % (ref 11.2–14.5)
WBC: 7.4 10*3/uL (ref 3.9–10.3)
lymph#: 0.7 10*3/uL — ABNORMAL LOW (ref 0.9–3.3)

## 2016-05-02 MED ORDER — LORAZEPAM 1 MG PO TABS: 1.0000 mg | ORAL_TABLET | Freq: Three times a day (TID) | ORAL | Status: DC | PRN

## 2016-05-02 NOTE — Telephone Encounter (Signed)
I told family member to contact PCP for ativan.

## 2016-05-03 ENCOUNTER — Ambulatory Visit (HOSPITAL_COMMUNITY): Payer: Medicare HMO

## 2016-05-09 ENCOUNTER — Other Ambulatory Visit: Payer: Self-pay | Admitting: Internal Medicine

## 2016-05-09 NOTE — Telephone Encounter (Signed)
lexapro refill refused - defer to PCP

## 2016-05-10 ENCOUNTER — Telehealth: Payer: Self-pay | Admitting: Internal Medicine

## 2016-05-10 NOTE — Telephone Encounter (Signed)
returned call and lvm for pt to call back to r/s appt °

## 2016-05-12 ENCOUNTER — Ambulatory Visit: Payer: Medicare HMO | Admitting: Internal Medicine

## 2016-05-13 ENCOUNTER — Telehealth: Payer: Self-pay | Admitting: Internal Medicine

## 2016-05-13 ENCOUNTER — Other Ambulatory Visit: Payer: Self-pay | Admitting: Family

## 2016-05-13 NOTE — Telephone Encounter (Signed)
pt called  to sched appt...done....pt ok and aware of new d.t

## 2016-05-19 ENCOUNTER — Ambulatory Visit (HOSPITAL_COMMUNITY)
Admission: RE | Admit: 2016-05-19 | Discharge: 2016-05-19 | Disposition: A | Discharge status: Home / Self Care | Payer: Medicare HMO | Source: Ambulatory Visit | Attending: Internal Medicine | Admitting: Internal Medicine

## 2016-05-19 ENCOUNTER — Encounter (HOSPITAL_COMMUNITY): Payer: Self-pay

## 2016-05-19 DIAGNOSIS — C3411 Malignant neoplasm of upper lobe, right bronchus or lung: Secondary | ICD-10-CM | POA: Diagnosis present

## 2016-05-19 DIAGNOSIS — R222 Localized swelling, mass and lump, trunk: Secondary | ICD-10-CM | POA: Insufficient documentation

## 2016-05-19 DIAGNOSIS — M4854XA Collapsed vertebra, not elsewhere classified, thoracic region, initial encounter for fracture: Secondary | ICD-10-CM | POA: Insufficient documentation

## 2016-05-19 MED ORDER — IOPAMIDOL (ISOVUE-300) INJECTION 61%
75.0000 mL | Freq: Once | INTRAVENOUS | Status: AC | PRN
  Administered 2016-05-19: 75 mL via INTRAVENOUS

## 2016-06-01 ENCOUNTER — Encounter: Payer: Self-pay | Admitting: Internal Medicine

## 2016-06-01 ENCOUNTER — Ambulatory Visit (HOSPITAL_BASED_OUTPATIENT_CLINIC_OR_DEPARTMENT_OTHER): Payer: Medicare HMO | Admitting: Internal Medicine

## 2016-06-01 ENCOUNTER — Telehealth: Payer: Self-pay | Admitting: Internal Medicine

## 2016-06-01 VITALS — BP 141/58 | HR 95 | Temp 98.6°F | Resp 18 | Ht 66.0 in | Wt 180.8 lb

## 2016-06-01 DIAGNOSIS — Z85118 Personal history of other malignant neoplasm of bronchus and lung: Secondary | ICD-10-CM | POA: Diagnosis not present

## 2016-06-01 DIAGNOSIS — C3411 Malignant neoplasm of upper lobe, right bronchus or lung: Secondary | ICD-10-CM

## 2016-06-01 NOTE — Telephone Encounter (Signed)
Gave pt cal & avs °

## 2016-06-01 NOTE — Progress Notes (Signed)
[place] [phone]   [phone]  OFFICE PROGRESS NOTE  Mauricio Po, FNP [address] [place] [address]  DIAGNOSIS: Non-small cell carcinoma of lung, stage 3  Staging form: Lung, AJCC 7th Edition  Clinical stage from 04/30/2015: Stage IIIA (T1a, N2, M0) - Signed by Curt Bears, MD on 04/30/2015  PRIOR THERAPY: Concurrent chemoradiation with chemotherapy in the form of weekly carboplatin for an AUC of 2 and paclitaxel at 45 mg/m given concurrent with radiation. Last dose was given 06/18/2015 with partial response.  CURRENT THERAPY: Observation.  INTERVAL HISTORY: Pamela Mcguire 81 y.o. female returns to the clinic today for follow-up visit accompanied by her daughter.The patient is feeling fine today except for mild fatigue. She gained few pounds since her last visit. She denied having any significant chest pain, shortness of breath, cough or hemoptysis. She has no nausea or vomiting, no fever or chills. The patient had repeat CT scan of the chest performed recently and she is here for evaluation and discussion of her scan results.  MEDICAL HISTORY: Past Medical History  Diagnosis Date  . Hyperlipidemia   . Diverticulitis   . CAD (coronary artery disease)   . AF (atrial fibrillation) ([place])   . Hypertension   . Seasonal allergies   . Prediabetes   . Depression   . Shortness of breath dyspnea   . Anxiety   . Urinary frequency   . GERD (gastroesophageal reflux disease)   . Sleep apnea      dx'd 10/2014, has not received cpap yet (04/28/2015)  . Arthritis     "thighs, hips, arms" (04/28/2015)  . Breast cancer ([place])     right mastectomy  . Non-small cell carcinoma of lung, stage 3 ([place]) 04/30/2015    ALLERGIES:  has No Known Allergies.  MEDICATIONS:  Current Outpatient Prescriptions  Medication Sig Dispense Refill  . apixaban (ELIQUIS) 5 MG TABS tablet Take 1 tablet twice daily 180 tablet 3  . atorvastatin (LIPITOR) 40 MG  tablet Take 1 tablet (40 mg total) by mouth daily. 90 tablet 3  . budesonide-formoterol (SYMBICORT) 160-4.5 MCG/ACT inhaler Inhale 2 puffs into the lungs 2 (two) times daily as needed (For wheezing, wait 1 min between each puff).    Marland Kitchen buPROPion (WELLBUTRIN SR) 100 MG 12 hr tablet TAKE 1 TABLET(100 MG) BY MOUTH DAILY 90 tablet 1  . carvedilol (COREG) 3.125 MG tablet Take 1 tablet (3.125 mg total) by mouth 2 (two) times daily. 180 tablet 3  . escitalopram (LEXAPRO) 10 MG tablet Take 1 tablet (10 mg total) by mouth daily. 90 tablet 1  . feeding supplement (BOOST / RESOURCE BREEZE) LIQD Take 1 Container by mouth 3 (three) times daily between meals. 90 Container 0  . fluticasone furoate-vilanterol (BREO ELLIPTA) 100-25 MCG/INH AEPB Inhale 1 puff into the lungs daily. 28 each 1  . folic acid (FOLVITE) 1 MG tablet TAKE 1 TABLET BY MOUTH TWICE DAILY 60 tablet 5  . loratadine (CLARITIN) 10 MG tablet TAKE 1 TABLET BY MOUTH AT BEDTIME 90 tablet 1  . LORazepam (ATIVAN) 1 MG tablet Take 1 tablet (1 mg total) by mouth every 8 (eight) hours as needed. for anxiety 45 tablet 0  . oxyCODONE (OXY IR/ROXICODONE) 5 MG immediate release tablet Take 1-2 tablets (5-10 mg total) by mouth every 4 (four) hours as needed for moderate pain. 60 tablet 0  . pantoprazole (PROTONIX) 40 MG tablet TAKE 1 TABLET BY MOUTH EVERY DAY 30  tablet 2  . UNABLE TO FIND Med Name: Wig 1 each 0  . acetaminophen (TYLENOL) 500 MG tablet Take 1,000 mg by mouth every 8 (eight) hours as needed. Reported on 06/01/2016    . albuterol (PROVENTIL HFA;VENTOLIN HFA) 108 (90 BASE) MCG/ACT inhaler Inhale 1 puff into the lungs every 6 (six) hours as needed for wheezing or shortness of breath. Reported on 06/01/2016    . diphenhydramine-acetaminophen (TYLENOL PM) 25-500 MG TABS tablet Take 1 tablet by mouth at bedtime as needed. Reported on 06/01/2016     No current facility-administered medications for this visit.    SURGICAL HISTORY:  Past Surgical History    Procedure Laterality Date  . Cardioversion    . Tonsillectomy    . Colonoscopy    . Cesarean section  X 2  . Mastectomy Right   . Breast surgery    . Mediastinoscopy N/A 04/28/2015    Procedure: MEDIASTINOSCOPY;  Surgeon: [name], MD;  Location: [facility] OR;  Service: Thoracic;  Laterality: N/A;    REVIEW OF SYSTEMS:  A comprehensive review of systems was negative except for: Constitutional: positive for fatigue   PHYSICAL EXAMINATION: General appearance: alert, cooperative, fatigued and no distress Head: Normocephalic, without obvious abnormality, atraumatic Neck: no adenopathy, no JVD, supple, symmetrical, trachea midline and thyroid not enlarged, symmetric, no tenderness/mass/nodules Lymph nodes: Cervical, supraclavicular, and axillary nodes normal. Resp: clear to auscultation bilaterally Back: symmetric, no curvature. ROM normal. No CVA tenderness. Cardio: regular rate and rhythm, S1, S2 normal, no murmur, click, rub or gallop GI: soft, non-tender; bowel sounds normal; no masses,  no organomegaly Extremities: extremities normal, atraumatic, no cyanosis or edema Neurologic: Alert and oriented X 3, normal strength and tone. Normal symmetric reflexes. Normal coordination and gait  ECOG PERFORMANCE STATUS: 2 - Symptomatic, <50% confined to bed  Blood pressure 141/58, pulse 95, temperature 98.6 F (37 C), temperature source Oral, resp. rate 18, height '5\' 6"'$  (1.676 m), weight 180 lb 12.8 oz (82.01 kg), SpO2 95 %.  LABORATORY DATA: Lab Results  Component Value Date   WBC 7.4 05/02/2016   HGB 11.6 05/02/2016   HCT 35.8 05/02/2016   MCV 86.0 05/02/2016   PLT 321 05/02/2016      Chemistry      Component Value Date/Time   NA 140 05/02/2016 1119   NA 135 10/19/2015 1505   K 4.2 05/02/2016 1119   K 4.8 10/19/2015 1505   CL 101 10/19/2015 1505   CO2 29 05/02/2016 1119   CO2 27 10/19/2015 1505   BUN 18.5 05/02/2016 1119   BUN 16 10/19/2015 1505   CREATININE 1.0 05/02/2016  1119   CREATININE 0.88 10/19/2015 1505   CREATININE 0.91 06/11/2014 1608      Component Value Date/Time   CALCIUM 9.1 05/02/2016 1119   CALCIUM 9.7 10/19/2015 1505   ALKPHOS 59 05/02/2016 1119   ALKPHOS 70 10/19/2015 1505   AST 13 05/02/2016 1119   AST 19 10/19/2015 1505   ALT 11 05/02/2016 1119   ALT 15 10/19/2015 1505   BILITOT 0.35 05/02/2016 1119   BILITOT 0.3 10/19/2015 1505       RADIOGRAPHIC STUDIES: Ct Chest W Contrast  05/19/2016  CLINICAL DATA:  Restaging RIGHT lung cancer EXAM: CT CHEST WITH CONTRAST TECHNIQUE: Multidetector CT imaging of the chest was performed during intravenous contrast administration. CONTRAST:  48m ISOVUE-300 IOPAMIDOL (ISOVUE-300) INJECTION 61% COMPARISON:  CT 02/01/2016 FINDINGS: Mediastinum/Nodes: No axillary or supraclavicular lymphadenopathy. No mediastinal hilar adenopathy. No pericardial fluid.  Lungs/Pleura: Postsurgical and radiation change in the RIGHT upper lobe with linear bands of fibrosis and bronchiectasis. No interval change. More peripheral consolidation with air bronchograms in the superior RIGHT upper lobe which is also unchanged. Mild subpleural reticulation within LEFT and RIGHT lung without measurable nodularity. Upper abdomen: Small LEFT adrenal nodule measuring 10 mm is again demonstrated and not changed. No upper abdominal adenopathy. No focal liver lesion on limited view of the liver parenchyma. Mild common bile duct dilatation following cholecystectomy similar prior. Musculoskeletal: Compression fracture of the T12 vertebral body unchanged from comparison exam with approximately 50% loss of height. Hemangioma in the T11 vertebral body. IMPRESSION: 1. No evidence of lung cancer recurrence. 2. Stable postoperative and post radiotherapy change in the RIGHT upper lobe. 3. Stable small LEFT adrenal nodule. 4. Stable compression fracture in the lower thoracic spine. Electronically Signed   By: Suzy Bouchard M.D.   On: 05/19/2016 15:24     ASSESSMENT AND PLAN: This is a very pleasant 81 years old white female with history of stage IIIA non-small cell lung cancer status post a course of concurrent chemoradiation with weekly carboplatin and paclitaxel with partial response.  The recent CT scan of the chest showed no evidence for disease progression.  I discussed the scan results with the patient and her daughter. I recommended for her to continue on observation with repeat CT scan of the chest in 6 months. She was advised to call immediately if she has any concerning symptoms in the interval. The patient voices understanding of current disease status and treatment options and is in agreement with the current care plan.  All questions were answered. The patient knows to call the clinic with any problems, questions or concerns. We can certainly see the patient much sooner if necessary.  Disclaimer: This note was dictated with voice recognition software. Similar sounding words can inadvertently be transcribed and may not be corrected upon review.

## 2016-06-21 ENCOUNTER — Encounter: Payer: Self-pay | Admitting: Family

## 2016-06-21 ENCOUNTER — Ambulatory Visit (INDEPENDENT_AMBULATORY_CARE_PROVIDER_SITE_OTHER): Payer: Medicare HMO | Admitting: Family

## 2016-06-21 VITALS — BP 152/80 | HR 80 | Temp 97.7°F | Resp 16 | Ht 66.0 in | Wt 182.0 lb

## 2016-06-21 DIAGNOSIS — S32019D Unspecified fracture of first lumbar vertebra, subsequent encounter for fracture with routine healing: Secondary | ICD-10-CM

## 2016-06-21 DIAGNOSIS — R05 Cough: Secondary | ICD-10-CM | POA: Diagnosis not present

## 2016-06-21 DIAGNOSIS — C3411 Malignant neoplasm of upper lobe, right bronchus or lung: Secondary | ICD-10-CM | POA: Diagnosis not present

## 2016-06-21 DIAGNOSIS — R21 Rash and other nonspecific skin eruption: Secondary | ICD-10-CM

## 2016-06-21 DIAGNOSIS — R059 Cough, unspecified: Secondary | ICD-10-CM

## 2016-06-21 MED ORDER — LORAZEPAM 1 MG PO TABS: 1.0000 mg | ORAL_TABLET | Freq: Three times a day (TID) | ORAL | 0 refills | Status: DC | PRN

## 2016-06-21 MED ORDER — AZITHROMYCIN 250 MG PO TABS: ORAL_TABLET | ORAL | 0 refills | Status: DC

## 2016-06-21 MED ORDER — OXYCODONE HCL 5 MG PO TABS: 5.0000 mg | ORAL_TABLET | ORAL | 0 refills | Status: DC | PRN

## 2016-06-21 NOTE — Patient Instructions (Signed)
Thank you for choosing [facility].  Summary/Instructions:  Please continue to take her medications as prescribed.  For your cough consider Delsym/Robitussin.  They will call to schedule your appointment dermatology.  Your prescription(s) have been submitted to your pharmacy or been printed and provided for you. Please take as directed and contact our office if you believe you are having problem(s) with the medication(s) or have any questions.  Referrals have been made during this visit. You should expect to hear back from our schedulers in about 7-10 days in regards to establishing an appointment with the specialists we discussed.   If your symptoms worsen or fail to improve, please contact our office for further instruction, or in case of emergency go directly to the emergency room at the closest medical facility.

## 2016-06-21 NOTE — Assessment & Plan Note (Signed)
Symptoms and exam concern for developing bronchitis in a patient with significant co-morbidities. Underlying lung cancer stable per recent oncology visit. Start azithromycin. Continue over the counter medications as needed for symptom relief and support care. Follow up if symptoms continue to worsen or do not improve.

## 2016-06-21 NOTE — Progress Notes (Signed)
Subjective:    Patient ID: Pamela Mcguire, female    DOB: 1935-01-17, 81 y.o.   MRN: [id number]  Chief Complaint  Patient presents with  . Cough    needs refill of pain meds and ativan, rash all over body, x1 week cough    HPI:  Pamela Mcguire is a 81 y.o. female who  has a past medical history of AF (atrial fibrillation) ([place]); Anxiety; Arthritis; Breast cancer ([place]); CAD (coronary artery disease); Depression; Diverticulitis; GERD (gastroesophageal reflux disease); Hyperlipidemia; Hypertension; Non-small cell carcinoma of lung, stage 3 (HCC) (04/30/2015); Prediabetes; Seasonal allergies; Shortness of breath dyspnea; Sleep apnea; and Urinary frequency. and presents today for an acute office visit.  1.) Cough - This is a new problem. Associated symptom of a cough that has been going on for about 1 week. No fevers. Denies any other symptoms currently. Modifying factors include Mucinex and Dayquil which has not helped very much. Course of the symptoms has generally stayed about the same. Cough is described as productive with no purulent sputum.   2.) Rash - This is a new problem. Associated symptom of a rash located her hips and legs has been going on for about 1 week. Described as small red dots that get larger. No itchiness. Course of the symptoms is progressively worsening and spreading. No changes to soaps, body care products or detergents. No new medications.  No Known Allergies   Current Outpatient Prescriptions on File Prior to Visit  Medication Sig Dispense Refill  . acetaminophen (TYLENOL) 500 MG tablet Take 1,000 mg by mouth every 8 (eight) hours as needed. Reported on 06/01/2016    . albuterol (PROVENTIL HFA;VENTOLIN HFA) 108 (90 BASE) MCG/ACT inhaler Inhale 1 puff into the lungs every 6 (six) hours as needed for wheezing or shortness of breath. Reported on 06/01/2016    . apixaban (ELIQUIS) 5 MG TABS tablet Take 1 tablet twice daily 180 tablet 3  . atorvastatin (LIPITOR) 40 MG tablet Take 1  tablet (40 mg total) by mouth daily. 90 tablet 3  . budesonide-formoterol (SYMBICORT) 160-4.5 MCG/ACT inhaler Inhale 2 puffs into the lungs 2 (two) times daily as needed (For wheezing, wait 1 min between each puff).    Marland Kitchen buPROPion (WELLBUTRIN SR) 100 MG 12 hr tablet TAKE 1 TABLET(100 MG) BY MOUTH DAILY 90 tablet 1  . carvedilol (COREG) 3.125 MG tablet Take 1 tablet (3.125 mg total) by mouth 2 (two) times daily. 180 tablet 3  . diphenhydramine-acetaminophen (TYLENOL PM) 25-500 MG TABS tablet Take 1 tablet by mouth at bedtime as needed. Reported on 06/01/2016    . escitalopram (LEXAPRO) 10 MG tablet Take 1 tablet (10 mg total) by mouth daily. 90 tablet 1  . feeding supplement (BOOST / RESOURCE BREEZE) LIQD Take 1 Container by mouth 3 (three) times daily between meals. 90 Container 0  . fluticasone furoate-vilanterol (BREO ELLIPTA) 100-25 MCG/INH AEPB Inhale 1 puff into the lungs daily. 28 each 1  . folic acid (FOLVITE) 1 MG tablet TAKE 1 TABLET BY MOUTH TWICE DAILY 60 tablet 5  . loratadine (CLARITIN) 10 MG tablet TAKE 1 TABLET BY MOUTH AT BEDTIME 90 tablet 1  . pantoprazole (PROTONIX) 40 MG tablet TAKE 1 TABLET BY MOUTH EVERY DAY 30 tablet 2  . UNABLE TO FIND Med Name: Wig 1 each 0   No current facility-administered medications on file prior to visit.      Past Surgical History:  Procedure Laterality Date  . BREAST SURGERY    . CARDIOVERSION    .  CESAREAN SECTION  X 2  . COLONOSCOPY    . MASTECTOMY Right   . MEDIASTINOSCOPY N/A 04/28/2015   Procedure: MEDIASTINOSCOPY;  Surgeon: [name], MD;  Location: [facility] OR;  Service: Thoracic;  Laterality: N/A;  . TONSILLECTOMY      Past Medical History:  Diagnosis Date  . AF (atrial fibrillation) ([place])   . Anxiety   . Arthritis    "thighs, hips, arms" (04/28/2015)  . Breast cancer ([place])    right mastectomy  . CAD (coronary artery disease)   . Depression   . Diverticulitis   . GERD (gastroesophageal reflux disease)   . Hyperlipidemia   .  Hypertension   . Non-small cell carcinoma of lung, stage 3 ([place]) 04/30/2015  . Prediabetes   . Seasonal allergies   . Shortness of breath dyspnea   . Sleep apnea     dx'd 10/2014, has not received cpap yet (04/28/2015)  . Urinary frequency      Review of Systems  Constitutional: Negative for chills and fever.  HENT: Positive for rhinorrhea. Negative for congestion, sinus pressure and sore throat.   Respiratory: Positive for cough. Negative for chest tightness and wheezing.   Cardiovascular: Negative for chest pain, palpitations and leg swelling.  Skin: Positive for rash.      Objective:    BP (!) 152/80 (BP Location: Left Arm, Patient Position: Sitting, Cuff Size: Normal)   Pulse 80   Temp 97.7 F (36.5 C) (Oral)   Resp 16   Ht '5\' 6"'$  (1.676 m)   Wt 182 lb (82.6 kg)   SpO2 97%   BMI 29.38 kg/m  Nursing note and vital signs reviewed.  Physical Exam  Constitutional: She is oriented to person, place, and time. She appears well-developed and well-nourished. No distress.  HENT:  Right Ear: Hearing, tympanic membrane, external ear and ear canal normal.  Left Ear: Hearing, tympanic membrane, external ear and ear canal normal.  Nose: Nose normal.  Mouth/Throat: Uvula is midline, oropharynx is clear and moist and mucous membranes are normal.  Cardiovascular: Normal rate, regular rhythm, normal heart sounds and intact distal pulses.   Pulmonary/Chest: Effort normal and breath sounds normal.  Neurological: She is alert and oriented to person, place, and time.  Skin: Skin is warm and dry. Rash (Macular rash with scaly light tan appearance and clearly defined borders sporadic around thighs, hips and upper extremities.) noted.  Psychiatric: She has a normal mood and affect. Her behavior is normal. Judgment and thought content normal.       Assessment & Plan:   Problem List Items Addressed This Visit      Respiratory   Adenocarcinoma of upper lobe of right lung   Relevant  Medications   oxyCODONE (OXY IR/ROXICODONE) 5 MG immediate release tablet   LORazepam (ATIVAN) 1 MG tablet   azithromycin (ZITHROMAX) 250 MG tablet     Musculoskeletal and Integument   Fracture of L1 vertebra (HCC)   Relevant Medications   oxyCODONE (OXY IR/ROXICODONE) 5 MG immediate release tablet   Rash and nonspecific skin eruption    Light tan macular scaly rash of undetermined origin. No itchiness. Hesitant to start steroid cream given multiple co-morbidities.Refer to dermatology for further assessment and treatment.       Relevant Orders   Ambulatory referral to Dermatology     Other   Cough - Primary    Symptoms and exam concern for developing bronchitis in a patient with significant co-morbidities. Underlying lung cancer stable per  recent oncology visit. Start azithromycin. Continue over the counter medications as needed for symptom relief and support care. Follow up if symptoms continue to worsen or do not improve.        Other Visit Diagnoses   None.      I am having Ms. Tse start on azithromycin. I am also having her maintain her albuterol, feeding supplement, carvedilol, budesonide-formoterol, loratadine, UNABLE TO FIND, acetaminophen, diphenhydramine-acetaminophen, atorvastatin, apixaban, buPROPion, escitalopram, pantoprazole, fluticasone furoate-vilanterol, folic acid, Melatonin, oxyCODONE, and LORazepam.   Meds ordered this encounter  Medications  . Melatonin 10 MG CAPS    Sig: Take by mouth.  . oxyCODONE (OXY IR/ROXICODONE) 5 MG immediate release tablet    Sig: Take 1-2 tablets (5-10 mg total) by mouth every 4 (four) hours as needed for moderate pain.    Dispense:  60 tablet    Refill:  0    Order Specific Question:   Supervising Provider    Answer:   Pricilla Holm A [[id number]]  . LORazepam (ATIVAN) 1 MG tablet    Sig: Take 1 tablet (1 mg total) by mouth every 8 (eight) hours as needed. for anxiety    Dispense:  45 tablet    Refill:  0    Order  Specific Question:   Supervising Provider    Answer:   Pricilla Holm A [[id number]]  . azithromycin (ZITHROMAX) 250 MG tablet    Sig: Take 2 tablets by mouth for 1 day and then 1 tablet daily for 4 days.    Dispense:  6 tablet    Refill:  0    Order Specific Question:   Supervising Provider    Answer:   Pricilla Holm A [[id number]]     Follow-up: Return in about 2 months (around 08/22/2016), or if symptoms worsen or fail to improve.  Mauricio Po, FNP

## 2016-06-21 NOTE — Assessment & Plan Note (Signed)
Light tan macular scaly rash of undetermined origin. No itchiness. Hesitant to start steroid cream given multiple co-morbidities.Refer to dermatology for further assessment and treatment.

## 2016-06-26 ENCOUNTER — Other Ambulatory Visit: Payer: Self-pay | Admitting: Internal Medicine

## 2016-07-08 ENCOUNTER — Other Ambulatory Visit: Payer: Self-pay | Admitting: Family

## 2016-07-09 ENCOUNTER — Other Ambulatory Visit: Payer: Self-pay | Admitting: Family

## 2016-07-13 ENCOUNTER — Ambulatory Visit (INDEPENDENT_AMBULATORY_CARE_PROVIDER_SITE_OTHER): Payer: Medicare HMO | Admitting: Podiatry

## 2016-07-13 ENCOUNTER — Encounter: Payer: Self-pay | Admitting: Podiatry

## 2016-07-13 DIAGNOSIS — B351 Tinea unguium: Secondary | ICD-10-CM | POA: Diagnosis not present

## 2016-07-13 DIAGNOSIS — M79676 Pain in unspecified toe(s): Secondary | ICD-10-CM | POA: Diagnosis not present

## 2016-07-13 NOTE — Progress Notes (Signed)
   Subjective:    Patient ID: Pamela Mcguire, female    DOB: [birth year], 81 y.o.   MRN: [id number]  HPI of this patient presents to the office with chief complaint of painful big toenails on both big toes. Patient states that the nails have grown long and thick and are painful walking and wearing her shoes. She denies any drainage or infection to the nail. She presents the office for preventive foot care services.  She says her third and fourth toenail right foot are loose.  No drainage.  Patient has numbness in feet.  She has been treated for cancer with radiation.    Review of Systems  HENT: Positive for hearing loss.   All other systems reviewed and are negative.      Objective:   Physical Exam GENERAL APPEARANCE: Alert, conversant. Appropriately groomed. No acute distress.  VASCULAR: Pedal pulses palpable at  [facility] and PT bilateral.  Capillary refill time is immediate to all digits,  Normal temperature gradient.  NEUROLOGIC: sensation is normal to 5.07 monofilament at 5/5 sites bilateral.  Light touch is intact bilateral, Muscle strength normal.  MUSCULOSKELETAL: acceptable muscle strength, tone and stability bilateral.  Intrinsic muscluature intact bilateral.  Rectus appearance of foot and digits noted bilateral.   DERMATOLOGIC: skin color, texture, and turgor are within normal limits.  No preulcerative lesions or ulcers  are seen, no interdigital maceration noted.  No open lesions present.  . No drainage noted.  NAILS  Thick disfigured discolored nails especially hallux toenails on both great toes. Third and fourth nail plates right foot are detached from nail bed.       Assessment & Plan:  Onychomycosis  B/L  IE  Debride nails     RTC 3 months.   Gardiner Barefoot DPM

## 2016-07-21 ENCOUNTER — Telehealth: Payer: Self-pay | Admitting: Internal Medicine

## 2016-07-21 NOTE — Telephone Encounter (Signed)
Unable to reach pt dtr or leave a message. Per wanda this will need to be sent to dr Claiborne Billings for the okay for the order.

## 2016-07-21 NOTE — Telephone Encounter (Signed)
Pt will not use C-Pap machine. In order to return it,she needs a prescription>pLease fax prescription to [facility].Please contact her if you have any questions or problems.i

## 2016-08-05 ENCOUNTER — Telehealth: Payer: Self-pay | Admitting: *Deleted

## 2016-08-05 NOTE — Telephone Encounter (Signed)
Daughter left msg on triage stating mom is needing refill on her Lorazepam.Pls advise...[name]

## 2016-08-06 ENCOUNTER — Other Ambulatory Visit: Payer: Self-pay | Admitting: Family

## 2016-08-07 ENCOUNTER — Other Ambulatory Visit: Payer: Self-pay | Admitting: Family

## 2016-08-07 MED ORDER — LORAZEPAM 1 MG PO TABS: 1.0000 mg | ORAL_TABLET | Freq: Three times a day (TID) | ORAL | 0 refills | Status: DC | PRN

## 2016-08-07 NOTE — Telephone Encounter (Signed)
Medication refilled

## 2016-08-08 NOTE — Telephone Encounter (Signed)
Called daughter no answer LMOM rx has been [facility]ed to [facility]...[name]

## 2016-09-03 ENCOUNTER — Other Ambulatory Visit: Payer: Self-pay | Admitting: Family

## 2016-09-08 ENCOUNTER — Encounter: Payer: Self-pay | Admitting: *Deleted

## 2016-09-08 ENCOUNTER — Other Ambulatory Visit: Payer: Medicare HMO

## 2016-09-08 ENCOUNTER — Other Ambulatory Visit: Payer: Self-pay | Admitting: *Deleted

## 2016-09-08 ENCOUNTER — Ambulatory Visit (INDEPENDENT_AMBULATORY_CARE_PROVIDER_SITE_OTHER)
Admission: RE | Admit: 2016-09-08 | Discharge: 2016-09-08 | Disposition: A | Discharge status: Home / Self Care | Payer: Medicare HMO | Source: Ambulatory Visit | Attending: Nurse Practitioner | Admitting: Nurse Practitioner

## 2016-09-08 ENCOUNTER — Ambulatory Visit (INDEPENDENT_AMBULATORY_CARE_PROVIDER_SITE_OTHER): Payer: Medicare HMO | Admitting: Nurse Practitioner

## 2016-09-08 ENCOUNTER — Encounter: Payer: Self-pay | Admitting: Nurse Practitioner

## 2016-09-08 VITALS — BP 108/62 | HR 89 | Temp 97.6°F | Ht 66.0 in | Wt 187.0 lb

## 2016-09-08 DIAGNOSIS — N3001 Acute cystitis with hematuria: Secondary | ICD-10-CM

## 2016-09-08 DIAGNOSIS — R0609 Other forms of dyspnea: Secondary | ICD-10-CM | POA: Diagnosis not present

## 2016-09-08 LAB — POCT URINALYSIS DIPSTICK
PH UA: 5
Spec Grav, UA: >=1.03
Urobilinogen, UA: >=8

## 2016-09-08 MED ORDER — CIPROFLOXACIN HCL 250 MG PO TABS
250.0000 mg | ORAL_TABLET | Freq: Two times a day (BID) | ORAL | 0 refills | Status: DC
Start: 2016-09-08 — End: 2016-09-12

## 2016-09-08 NOTE — Patient Instructions (Addendum)
Encourage adequate oral hydration. Will call with urine culture results. No acute finding on CXR.  Use breo and albuterol as prescribed. Use home oxygen as precribed.

## 2016-09-08 NOTE — Progress Notes (Signed)
Pre visit review using our clinic review tool, if applicable. No additional management support is needed unless otherwise documented below in the visit note. 

## 2016-09-08 NOTE — Progress Notes (Signed)
Subjective:  Patient ID: Pamela Mcguire, female    DOB: 1935-09-20  Age: 81 y.o. MRN: [id number]  CC: Urinary Tract Infection (Pt stated frequency going to rest room  for about 3 weeks.) and Breathing Problem (pt stated having hard time breathing.)   Urinary Tract Infection   This is a new problem. The current episode started 1 to 4 weeks ago. The problem occurs every urination. The problem has been gradually worsening. The quality of the pain is described as burning. The pain is moderate. There has been no fever. She is not sexually active. There is no history of pyelonephritis. Associated symptoms include frequency and urgency. Pertinent negatives include no chills, discharge, hematuria, hesitancy, nausea, sweats or vomiting. Treatments tried: AZO and cranberry. The treatment provided no relief. Her past medical history is significant for recurrent UTIs. There is no history of urinary stasis.  Breathing Problem  She complains of difficulty breathing and shortness of breath. There is no chest tightness, cough, frequent throat clearing, hemoptysis, hoarse voice, sputum production or wheezing. This is a recurrent problem. The current episode started in the past 7 days. The problem occurs constantly. The problem has been gradually worsening. Associated symptoms include dyspnea on exertion and PND. Pertinent negatives include no appetite change, chest pain, fever, headaches, heartburn, malaise/fatigue, myalgias, nasal congestion, orthopnea, postnasal drip, rhinorrhea, sneezing, sore throat or sweats. Her symptoms are aggravated by any activity and lying down. Her symptoms are alleviated by rest. She reports no improvement on treatment. Risk factors: lung cancer. Her past medical history is significant for COPD. Past medical history comments: Has not been using albuterol, breo, and home oxygen as prescribed..    Outpatient Medications Prior to Visit  Medication Sig Dispense Refill  . acetaminophen (TYLENOL)  500 MG tablet Take 1,000 mg by mouth every 8 (eight) hours as needed. Reported on 06/01/2016    . albuterol (PROVENTIL HFA;VENTOLIN HFA) 108 (90 BASE) MCG/ACT inhaler Inhale 1 puff into the lungs every 6 (six) hours as needed for wheezing or shortness of breath. Reported on 06/01/2016    . apixaban (ELIQUIS) 5 MG TABS tablet Take 1 tablet twice daily 180 tablet 3  . atorvastatin (LIPITOR) 40 MG tablet Take 1 tablet (40 mg total) by mouth daily. 90 tablet 3  . betamethasone dipropionate (DIPROLENE) 0.05 % cream   3  . budesonide-formoterol (SYMBICORT) 160-4.5 MCG/ACT inhaler Inhale 2 puffs into the lungs 2 (two) times daily as needed (For wheezing, wait 1 min between each puff).    Marland Kitchen buPROPion (WELLBUTRIN SR) 100 MG 12 hr tablet TAKE 1 TABLET(100 MG) BY MOUTH DAILY 90 tablet 1  . carvedilol (COREG) 3.125 MG tablet TAKE 1 TABLET(3.125 MG) BY MOUTH TWICE DAILY 180 tablet 2  . diphenhydramine-acetaminophen (TYLENOL PM) 25-500 MG TABS tablet Take 1 tablet by mouth at bedtime as needed. Reported on 06/01/2016    . escitalopram (LEXAPRO) 10 MG tablet Take 1 tablet (10 mg total) by mouth daily. 90 tablet 1  . feeding supplement (BOOST / RESOURCE BREEZE) LIQD Take 1 Container by mouth 3 (three) times daily between meals. 90 Container 0  . fluticasone furoate-vilanterol (BREO ELLIPTA) 100-25 MCG/INH AEPB Inhale 1 puff into the lungs daily. 28 each 1  . folic acid (FOLVITE) 1 MG tablet TAKE 1 TABLET BY MOUTH TWICE DAILY 60 tablet 5  . loratadine (CLARITIN) 10 MG tablet TAKE 1 TABLET BY MOUTH AT BEDTIME 90 tablet 0  . LORazepam (ATIVAN) 1 MG tablet Take 1 tablet (1 mg total)  by mouth every 8 (eight) hours as needed. for anxiety 45 tablet 0  . Melatonin 10 MG CAPS Take by mouth.    . mometasone-formoterol (DULERA) 100-5 MCG/ACT AERO Inhale into the lungs.    . pantoprazole (PROTONIX) 40 MG tablet TAKE 1 TABLET BY MOUTH EVERY DAY 30 tablet 5  . triamcinolone cream (KENALOG) 0.1 %     . UNABLE TO FIND Med Name: Wig 1  each 0  . azithromycin (ZITHROMAX) 250 MG tablet Take 2 tablets by mouth for 1 day and then 1 tablet daily for 4 days. 6 tablet 0  . oxyCODONE (OXY IR/ROXICODONE) 5 MG immediate release tablet Take 1-2 tablets (5-10 mg total) by mouth every 4 (four) hours as needed for moderate pain. (Patient not taking: Reported on 09/08/2016) 60 tablet 0   No facility-administered medications prior to visit.     ROS See HPI  Objective:  BP 108/62 (BP Location: Left Arm, Patient Position: Sitting, Cuff Size: Normal)   Pulse 89   Temp 97.6 F (36.4 C)   Ht '5\' 6"'$  (1.676 m)   Wt 187 lb (84.8 kg)   SpO2 95% Comment: on RA  BMI 30.18 kg/m   BP Readings from Last 3 Encounters:  09/08/16 108/62  06/21/16 (!) 152/80  06/01/16 (!) 141/58    Wt Readings from Last 3 Encounters:  09/08/16 187 lb (84.8 kg)  06/21/16 182 lb (82.6 kg)  06/01/16 180 lb 12.8 oz (82 kg)    Physical Exam  Constitutional: She is oriented to person, place, and time. No distress.  HENT:  Mouth/Throat: No oropharyngeal exudate.  Neck: Normal range of motion. Neck supple.  Cardiovascular: Normal rate and normal heart sounds.   Pulmonary/Chest: Effort normal and breath sounds normal. She exhibits no tenderness.  Musculoskeletal: She exhibits no edema.  Neurological: She is alert and oriented to person, place, and time.  Skin: Skin is warm and dry.  Vitals reviewed.   Lab Results  Component Value Date   WBC 7.4 05/02/2016   HGB 11.6 05/02/2016   HCT 35.8 05/02/2016   PLT 321 05/02/2016   GLUCOSE 140 05/02/2016   CHOL 191 06/06/2014   TRIG 123 06/06/2014   HDL 44 06/06/2014   LDLCALC 122 (H) 06/06/2014   ALT 11 05/02/2016   AST 13 05/02/2016   NA 140 05/02/2016   K 4.2 05/02/2016   CL 101 10/19/2015   CREATININE 1.0 05/02/2016   BUN 18.5 05/02/2016   CO2 29 05/02/2016   TSH 0.76 08/18/2015   INR 1.21 09/13/2015    Ct Chest W Contrast  Result Date: 05/19/2016 CLINICAL DATA:  Restaging RIGHT lung cancer  EXAM: CT CHEST WITH CONTRAST TECHNIQUE: Multidetector CT imaging of the chest was performed during intravenous contrast administration. CONTRAST:  46m ISOVUE-300 IOPAMIDOL (ISOVUE-300) INJECTION 61% COMPARISON:  CT 02/01/2016 FINDINGS: Mediastinum/Nodes: No axillary or supraclavicular lymphadenopathy. No mediastinal hilar adenopathy. No pericardial fluid. Lungs/Pleura: Postsurgical and radiation change in the RIGHT upper lobe with linear bands of fibrosis and bronchiectasis. No interval change. More peripheral consolidation with air bronchograms in the superior RIGHT upper lobe which is also unchanged. Mild subpleural reticulation within LEFT and RIGHT lung without measurable nodularity. Upper abdomen: Small LEFT adrenal nodule measuring 10 mm is again demonstrated and not changed. No upper abdominal adenopathy. No focal liver lesion on limited view of the liver parenchyma. Mild common bile duct dilatation following cholecystectomy similar prior. Musculoskeletal: Compression fracture of the T12 vertebral body unchanged from comparison exam with approximately  50% loss of height. Hemangioma in the T11 vertebral body. IMPRESSION: 1. No evidence of lung cancer recurrence. 2. Stable postoperative and post radiotherapy change in the RIGHT upper lobe. 3. Stable small LEFT adrenal nodule. 4. Stable compression fracture in the lower thoracic spine. Electronically Signed   By: Suzy Bouchard M.D.   On: 05/19/2016 15:24    Assessment & Plan:   Cheyanna was seen today for urinary tract infection and breathing problem.  Diagnoses and all orders for this visit:  Acute cystitis with hematuria -     POCT urinalysis dipstick -     Urine culture; Future -     ciprofloxacin (CIPRO) 250 MG tablet; Take 1 tablet (250 mg total) by mouth 2 (two) times daily.  Dyspnea on exertion -     DG Chest 2 View; Future   I have discontinued Ms. Payson's azithromycin. I am also having her start on ciprofloxacin. Additionally, I am  having her maintain her albuterol, feeding supplement, budesonide-formoterol, UNABLE TO FIND, acetaminophen, diphenhydramine-acetaminophen, atorvastatin, apixaban, buPROPion, escitalopram, fluticasone furoate-vilanterol, folic acid, Melatonin, oxyCODONE, carvedilol, loratadine, LORazepam, pantoprazole, mometasone-formoterol, triamcinolone cream, betamethasone dipropionate, and Cranberry-Vitamin C-Probiotic (AZO CRANBERRY PO).  Meds ordered this encounter  Medications  . Cranberry-Vitamin C-Probiotic (AZO CRANBERRY PO)    Sig: Take by mouth.  . ciprofloxacin (CIPRO) 250 MG tablet    Sig: Take 1 tablet (250 mg total) by mouth 2 (two) times daily.    Dispense:  20 tablet    Refill:  0    Order Specific Question:   Supervising Provider    Answer:   Cassandria Anger [[id number]]    Follow-up: Return if symptoms worsen or fail to improve.  Wilfred Lacy, NP

## 2016-09-10 LAB — URINE CULTURE

## 2016-09-12 MED ORDER — AMOXICILLIN-POT CLAVULANATE 875-125 MG PO TABS: 1.0000 | ORAL_TABLET | Freq: Two times a day (BID) | ORAL | 0 refills | Status: DC

## 2016-09-12 NOTE — Progress Notes (Signed)
abnormal, Inform patient urine is positive for e.coli which is not sensitive to current oral abx. Sent another prescription of augmentin. Stop cipro and start augmentin.

## 2016-09-21 ENCOUNTER — Telehealth: Payer: Self-pay | Admitting: *Deleted

## 2016-09-21 MED ORDER — LORAZEPAM 1 MG PO TABS: 1.0000 mg | ORAL_TABLET | Freq: Three times a day (TID) | ORAL | 0 refills | Status: DC | PRN

## 2016-09-21 NOTE — Telephone Encounter (Signed)
Faxed script to [facility].../lmb 

## 2016-09-21 NOTE — Telephone Encounter (Signed)
Medication refilled

## 2016-09-21 NOTE — Telephone Encounter (Signed)
Rec'd call daughter requesting refill on mom Lorazepam.../lmb

## 2016-09-22 ENCOUNTER — Other Ambulatory Visit: Payer: Self-pay | Admitting: Family

## 2016-10-03 ENCOUNTER — Telehealth: Payer: Self-pay | Admitting: Family

## 2016-10-03 NOTE — Telephone Encounter (Signed)
[name] called request Ativan fax to [facility] in [place] (this is where Pamela Mcguire is right now). Please help, fax # [phone].

## 2016-10-03 NOTE — Telephone Encounter (Signed)
Please advise 

## 2016-10-05 MED ORDER — LORAZEPAM 1 MG PO TABS: 1.0000 mg | ORAL_TABLET | Freq: Three times a day (TID) | ORAL | 0 refills | Status: DC | PRN

## 2016-10-05 NOTE — Telephone Encounter (Signed)
Prescription printed to be faxed.  

## 2016-10-05 NOTE — Telephone Encounter (Signed)
Rx faxed. Pts daughter aware

## 2016-10-12 ENCOUNTER — Ambulatory Visit: Payer: Medicare HMO | Admitting: Podiatry

## 2016-10-14 ENCOUNTER — Other Ambulatory Visit: Payer: Self-pay | Admitting: Family

## 2016-11-06 ENCOUNTER — Other Ambulatory Visit: Payer: Self-pay | Admitting: Family

## 2016-11-06 DIAGNOSIS — F32A Depression, unspecified: Secondary | ICD-10-CM

## 2016-11-06 DIAGNOSIS — F329 Major depressive disorder, single episode, unspecified: Secondary | ICD-10-CM

## 2016-11-15 ENCOUNTER — Other Ambulatory Visit: Payer: Self-pay | Admitting: *Deleted

## 2016-11-15 DIAGNOSIS — R918 Other nonspecific abnormal finding of lung field: Secondary | ICD-10-CM

## 2016-11-15 MED ORDER — FLUTICASONE FUROATE-VILANTEROL 100-25 MCG/INH IN AEPB: 1.0000 | INHALATION_SPRAY | Freq: Every day | RESPIRATORY_TRACT | 3 refills | Status: DC

## 2016-11-15 MED ORDER — ALBUTEROL SULFATE HFA 108 (90 BASE) MCG/ACT IN AERS: 1.0000 | INHALATION_SPRAY | Freq: Four times a day (QID) | RESPIRATORY_TRACT | 1 refills | Status: AC | PRN

## 2016-11-17 ENCOUNTER — Other Ambulatory Visit: Payer: Self-pay | Admitting: Internal Medicine

## 2016-11-18 ENCOUNTER — Telehealth: Payer: Self-pay | Admitting: Internal Medicine

## 2016-11-18 NOTE — Telephone Encounter (Signed)
Samples at the front desk  Perez,Donna (DPR) notified she will come ASAP

## 2016-11-18 NOTE — Telephone Encounter (Signed)
°  Patient calling the office for samples of medication: ° ° °1.  What medication and dosage are you requesting samples for? °Eliquis  °2.  Are you currently out of this medication?  °Yes  ° ° °

## 2016-11-29 ENCOUNTER — Other Ambulatory Visit (HOSPITAL_BASED_OUTPATIENT_CLINIC_OR_DEPARTMENT_OTHER): Payer: Medicare HMO

## 2016-11-29 ENCOUNTER — Ambulatory Visit (HOSPITAL_COMMUNITY)
Admission: RE | Admit: 2016-11-29 | Discharge: 2016-11-29 | Disposition: A | Discharge status: Home / Self Care | Payer: Medicare HMO | Source: Ambulatory Visit | Attending: Internal Medicine | Admitting: Internal Medicine

## 2016-11-29 ENCOUNTER — Telehealth: Payer: Self-pay | Admitting: *Deleted

## 2016-11-29 DIAGNOSIS — C3411 Malignant neoplasm of upper lobe, right bronchus or lung: Secondary | ICD-10-CM | POA: Diagnosis not present

## 2016-11-29 DIAGNOSIS — I700 Atherosclerosis of aorta: Secondary | ICD-10-CM | POA: Diagnosis not present

## 2016-11-29 DIAGNOSIS — J90 Pleural effusion, not elsewhere classified: Secondary | ICD-10-CM | POA: Diagnosis not present

## 2016-11-29 DIAGNOSIS — I2510 Atherosclerotic heart disease of native coronary artery without angina pectoris: Secondary | ICD-10-CM | POA: Insufficient documentation

## 2016-11-29 LAB — CBC WITH DIFFERENTIAL/PLATELET
BASO%: 0.5 % (ref 0.0–2.0)
BASOS ABS: 0 10*3/uL (ref 0.0–0.1)
EOS ABS: 0.1 10*3/uL (ref 0.0–0.5)
EOS%: 1 % (ref 0.0–7.0)
HCT: 25.4 % — ABNORMAL LOW (ref 34.8–46.6)
HGB: 7.4 g/dL — ABNORMAL LOW (ref 11.6–15.9)
LYMPH%: 10.9 % — AB (ref 14.0–49.7)
MCH: 21.5 pg — AB (ref 25.1–34.0)
MCHC: 29.1 g/dL — ABNORMAL LOW (ref 31.5–36.0)
MCV: 73.8 fL — ABNORMAL LOW (ref 79.5–101.0)
MONO#: 0.7 10*3/uL (ref 0.1–0.9)
MONO%: 8.5 % (ref 0.0–14.0)
NEUT#: 6.1 10*3/uL (ref 1.5–6.5)
NEUT%: 79.1 % — AB (ref 38.4–76.8)
PLATELETS: 385 10*3/uL (ref 145–400)
RBC: 3.44 10*6/uL — AB (ref 3.70–5.45)
RDW: 16.2 % — ABNORMAL HIGH (ref 11.2–14.5)
WBC: 7.7 10*3/uL (ref 3.9–10.3)
lymph#: 0.8 10*3/uL — ABNORMAL LOW (ref 0.9–3.3)

## 2016-11-29 LAB — COMPREHENSIVE METABOLIC PANEL
ALK PHOS: 58 U/L (ref 40–150)
ALT: 8 U/L (ref 0–55)
ANION GAP: 9 meq/L (ref 3–11)
AST: 13 U/L (ref 5–34)
Albumin: 3.6 g/dL (ref 3.5–5.0)
BUN: 24.5 mg/dL (ref 7.0–26.0)
CO2: 24 meq/L (ref 22–29)
Calcium: 9.4 mg/dL (ref 8.4–10.4)
Chloride: 106 mEq/L (ref 98–109)
Creatinine: 0.9 mg/dL (ref 0.6–1.1)
EGFR: 61 mL/min/{1.73_m2} — AB (ref >90)
GLUCOSE: 96 mg/dL (ref 70–140)
POTASSIUM: 4.3 meq/L (ref 3.5–5.1)
SODIUM: 139 meq/L (ref 136–145)
Total Bilirubin: 0.42 mg/dL (ref 0.20–1.20)
Total Protein: 7.1 g/dL (ref 6.4–8.3)

## 2016-11-29 MED ORDER — IOPAMIDOL (ISOVUE-300) INJECTION 61%
INTRAVENOUS | Status: AC
  Administered 2016-11-29: 75 mL via INTRAVENOUS
  Filled 2016-11-29: qty 75

## 2016-11-29 NOTE — Telephone Encounter (Signed)
Spoke with Diane @ [facility] for result report of CT scan done today.   Results printed and gave to Dr. Julien Nordmann for review.

## 2016-12-01 ENCOUNTER — Telehealth: Payer: Self-pay | Admitting: *Deleted

## 2016-12-01 MED ORDER — LORAZEPAM 1 MG PO TABS: 1.0000 mg | ORAL_TABLET | Freq: Three times a day (TID) | ORAL | 0 refills | Status: DC | PRN

## 2016-12-01 NOTE — Telephone Encounter (Signed)
Daughter left msg on triage requesting refill on mom Ativan...[name]

## 2016-12-01 NOTE — Telephone Encounter (Signed)
Medication refilled and printed.

## 2016-12-02 NOTE — Telephone Encounter (Signed)
Faxed

## 2016-12-06 ENCOUNTER — Ambulatory Visit: Payer: Medicare HMO

## 2016-12-06 ENCOUNTER — Ambulatory Visit (HOSPITAL_BASED_OUTPATIENT_CLINIC_OR_DEPARTMENT_OTHER): Payer: Medicare HMO | Admitting: Internal Medicine

## 2016-12-06 ENCOUNTER — Encounter: Payer: Self-pay | Admitting: Internal Medicine

## 2016-12-06 ENCOUNTER — Other Ambulatory Visit: Payer: Self-pay | Admitting: Medical Oncology

## 2016-12-06 ENCOUNTER — Other Ambulatory Visit: Payer: Self-pay | Admitting: Internal Medicine

## 2016-12-06 ENCOUNTER — Ambulatory Visit (HOSPITAL_BASED_OUTPATIENT_CLINIC_OR_DEPARTMENT_OTHER): Payer: Medicare HMO

## 2016-12-06 ENCOUNTER — Ambulatory Visit (HOSPITAL_COMMUNITY)
Admission: RE | Admit: 2016-12-06 | Discharge: 2016-12-06 | Disposition: A | Discharge status: Home / Self Care | Payer: Medicare HMO | Source: Ambulatory Visit | Attending: Internal Medicine | Admitting: Internal Medicine

## 2016-12-06 VITALS — BP 95/56 | HR 94 | Temp 97.6°F | Resp 18 | Ht 66.0 in | Wt 188.8 lb

## 2016-12-06 DIAGNOSIS — D649 Anemia, unspecified: Secondary | ICD-10-CM | POA: Insufficient documentation

## 2016-12-06 DIAGNOSIS — D539 Nutritional anemia, unspecified: Secondary | ICD-10-CM

## 2016-12-06 DIAGNOSIS — R197 Diarrhea, unspecified: Secondary | ICD-10-CM | POA: Insufficient documentation

## 2016-12-06 DIAGNOSIS — C3411 Malignant neoplasm of upper lobe, right bronchus or lung: Secondary | ICD-10-CM

## 2016-12-06 DIAGNOSIS — R0609 Other forms of dyspnea: Secondary | ICD-10-CM

## 2016-12-06 DIAGNOSIS — I959 Hypotension, unspecified: Secondary | ICD-10-CM

## 2016-12-06 DIAGNOSIS — E860 Dehydration: Secondary | ICD-10-CM | POA: Diagnosis not present

## 2016-12-06 HISTORY — DX: Dehydration: E86.0

## 2016-12-06 LAB — CBC & DIFF AND RETIC
BASO%: 0.4 % (ref 0.0–2.0)
BASOS ABS: 0 10*3/uL (ref 0.0–0.1)
EOS ABS: 0.1 10*3/uL (ref 0.0–0.5)
EOS%: 1.1 % (ref 0.0–7.0)
HEMATOCRIT: 24.3 % — AB (ref 34.8–46.6)
HGB: 7 g/dL — ABNORMAL LOW (ref 11.6–15.9)
IMMATURE RETIC FRACT: 22.8 % — AB (ref 1.60–10.00)
LYMPH#: 1 10*3/uL (ref 0.9–3.3)
LYMPH%: 12.4 % — ABNORMAL LOW (ref 14.0–49.7)
MCH: 21 pg — ABNORMAL LOW (ref 25.1–34.0)
MCHC: 28.8 g/dL — ABNORMAL LOW (ref 31.5–36.0)
MCV: 72.8 fL — ABNORMAL LOW (ref 79.5–101.0)
MONO#: 0.6 10*3/uL (ref 0.1–0.9)
MONO%: 7.2 % (ref 0.0–14.0)
NEUT%: 78.9 % — ABNORMAL HIGH (ref 38.4–76.8)
NEUTROS ABS: 6.5 10*3/uL (ref 1.5–6.5)
Platelets: 452 10*3/uL — ABNORMAL HIGH (ref 145–400)
RBC: 3.34 10*6/uL — ABNORMAL LOW (ref 3.70–5.45)
RDW: 16.3 % — AB (ref 11.2–14.5)
RETIC %: 1.51 % (ref 0.70–2.10)
RETIC CT ABS: 50.43 10*3/uL (ref 33.70–90.70)
WBC: 8.2 10*3/uL (ref 3.9–10.3)

## 2016-12-06 LAB — IRON AND TIBC
%SAT: 5 % — AB (ref 21–57)
IRON: 21 ug/dL — AB (ref 41–142)
TIBC: 454 ug/dL — AB (ref 236–444)
UIBC: 432 ug/dL — ABNORMAL HIGH (ref 120–384)

## 2016-12-06 LAB — FERRITIN: FERRITIN: 15 ng/mL (ref 9–269)

## 2016-12-06 LAB — ABO/RH: ABO/RH(D): B NEG

## 2016-12-06 MED ORDER — INTEGRA PLUS PO CAPS: ORAL_CAPSULE | ORAL | 2 refills | Status: DC

## 2016-12-06 MED ORDER — SODIUM CHLORIDE 0.9 % IV SOLN
Freq: Once | INTRAVENOUS | Status: DC
  Administered 2016-12-06: 14:00:00 via INTRAVENOUS

## 2016-12-06 NOTE — Progress Notes (Signed)
[facility] [phone]   [phone]  OFFICE PROGRESS NOTE  Mauricio Po, FNP [place] [address]  DIAGNOSIS: Stage IIIA (T1a, N2, M0) non-small cell lung cancer Non-small cell carcinoma of lung, adenocarcinoma presented with right upper lobe lung mass and mediastinal lymphadenopathy diagnosed in May 2016.  PRIOR THERAPY: Concurrent chemoradiation with chemotherapy in the form of weekly carboplatin for an AUC of 2 and paclitaxel at 45 mg/m given concurrent with radiation. Last dose was given 06/18/2015 with partial response.  CURRENT THERAPY: Observation.  INTERVAL HISTORY: Pamela Mcguire 82 y.o. female came to the clinic today for follow-up visit accompanied by her daughter. The patient is not feeling well today. She is complaining of increasing fatigue and weakness as well as dizzy spells with some visual disturbance and hazy vision. She has been complaining of diarrhea for several weeks. She was treated with Imodium with no significant improvement. She did not seek any medical attention. Her blood pressure was very low today in the clinic. She also looks very pale today. She has no chest pain but continues to have shortness of breath at baseline and increased with exertion. She has no fever or chills. She was treated recently for urinary tract infection. She denied having any bleeding issues and no blood in her stool. She had repeat CT scan of the chest as well as lab work performed recently and she is here for evaluation and discussion of her lab and scan results.  MEDICAL HISTORY: Past Medical History:  Diagnosis Date  . AF (atrial fibrillation) ([place])   . Anxiety   . Arthritis    "thighs, hips, arms" (04/28/2015)  . Breast cancer ([place])    right mastectomy  . CAD (coronary artery disease)   . Depression   . Diverticulitis   . GERD (gastroesophageal reflux disease)   . Hyperlipidemia   . Hypertension   . Non-small cell  carcinoma of lung, stage 3 ([place]) 04/30/2015  . Prediabetes   . Seasonal allergies   . Shortness of breath dyspnea   . Sleep apnea     dx'd 10/2014, has not received cpap yet (04/28/2015)  . Urinary frequency     ALLERGIES:  has No Known Allergies.  MEDICATIONS:  Current Outpatient Prescriptions  Medication Sig Dispense Refill  . acetaminophen (TYLENOL) 500 MG tablet Take 1,000 mg by mouth every 8 (eight) hours as needed. Reported on 06/01/2016    . albuterol (PROVENTIL HFA;VENTOLIN HFA) 108 (90 Base) MCG/ACT inhaler Inhale 1 puff into the lungs every 6 (six) hours as needed for wheezing or shortness of breath. Reported on 06/01/2016 18 g 1  . atorvastatin (LIPITOR) 40 MG tablet Take 1 tablet (40 mg total) by mouth daily. 90 tablet 3  . betamethasone dipropionate (DIPROLENE) 0.05 % cream   3  . budesonide-formoterol (SYMBICORT) 160-4.5 MCG/ACT inhaler Inhale 2 puffs into the lungs 2 (two) times daily as needed (For wheezing, wait 1 min between each puff).    Marland Kitchen buPROPion (WELLBUTRIN SR) 100 MG 12 hr tablet TAKE 1 TABLET(100 MG) BY MOUTH DAILY 90 tablet 0  . carvedilol (COREG) 3.125 MG tablet TAKE 1 TABLET(3.125 MG) BY MOUTH TWICE DAILY 180 tablet 2  . Cranberry-Vitamin C-Probiotic (AZO CRANBERRY PO) Take by mouth.    . diphenhydramine-acetaminophen (TYLENOL PM) 25-500 MG TABS tablet Take 1 tablet by mouth at bedtime as needed. Reported on 06/01/2016    . ELIQUIS 5 MG TABS tablet TAKE 1 TABLET BY  MOUTH TWICE DAILY 180 tablet 2  . escitalopram (LEXAPRO) 10 MG tablet TAKE 1 TABLET(10 MG) BY MOUTH DAILY 90 tablet 0  . feeding supplement (BOOST / RESOURCE BREEZE) LIQD Take 1 Container by mouth 3 (three) times daily between meals. 90 Container 0  . fluticasone furoate-vilanterol (BREO ELLIPTA) 100-25 MCG/INH AEPB Inhale 1 puff into the lungs daily. 28 each 3  . folic acid (FOLVITE) 1 MG tablet TAKE 1 TABLET BY MOUTH TWICE DAILY 60 tablet 5  . loratadine (CLARITIN) 10 MG tablet TAKE 1 TABLET BY MOUTH AT  BEDTIME 90 tablet 0  . LORazepam (ATIVAN) 1 MG tablet Take 1 tablet (1 mg total) by mouth every 8 (eight) hours as needed. for anxiety 45 tablet 0  . Melatonin 10 MG CAPS Take by mouth.    . mometasone-formoterol (DULERA) 100-5 MCG/ACT AERO Inhale into the lungs.    . pantoprazole (PROTONIX) 40 MG tablet TAKE 1 TABLET BY MOUTH EVERY DAY 30 tablet 5  . triamcinolone cream (KENALOG) 0.1 %     . UNABLE TO FIND Med Name: Wig 1 each 0   No current facility-administered medications for this visit.     SURGICAL HISTORY:  Past Surgical History:  Procedure Laterality Date  . BREAST SURGERY    . CARDIOVERSION    . CESAREAN SECTION  X 2  . COLONOSCOPY    . MASTECTOMY Right   . MEDIASTINOSCOPY N/A 04/28/2015   Procedure: MEDIASTINOSCOPY;  Surgeon: [name], MD;  Location: [facility] OR;  Service: Thoracic;  Laterality: N/A;  . TONSILLECTOMY      REVIEW OF SYSTEMS:  Constitutional: positive for anorexia and fatigue Eyes: positive for irritation and Pale sclera Ears, nose, mouth, throat, and face: negative Respiratory: positive for cough and dyspnea on exertion Cardiovascular: negative Gastrointestinal: positive for diarrhea Genitourinary:negative Integument/breast: negative Hematologic/lymphatic: negative Musculoskeletal:negative Neurological: negative Behavioral/Psych: negative Endocrine: negative Allergic/Immunologic: negative   PHYSICAL EXAMINATION: General appearance: alert, cooperative, fatigued, no distress and pale Head: Normocephalic, without obvious abnormality, atraumatic Neck: no adenopathy, no JVD, supple, symmetrical, trachea midline and thyroid not enlarged, symmetric, no tenderness/mass/nodules Lymph nodes: Cervical, supraclavicular, and axillary nodes normal. Resp: wheezes bilaterally Back: symmetric, no curvature. ROM normal. No CVA tenderness. Cardio: regular rate and rhythm, S1, S2 normal, no murmur, click, rub or gallop GI: soft, non-tender; bowel sounds normal; no  masses,  no organomegaly Extremities: extremities normal, atraumatic, no cyanosis or edema Neurologic: Alert and oriented X 3, normal strength and tone. Normal symmetric reflexes. Normal coordination and gait  ECOG PERFORMANCE STATUS: 2 - Symptomatic, <50% confined to bed  Blood pressure (!) 95/56, pulse 94, temperature 97.6 F (36.4 C), temperature source Oral, resp. rate 18, height '5\' 6"'$  (1.676 m), weight 188 lb 12.8 oz (85.6 kg), SpO2 96 %.  LABORATORY DATA: Lab Results  Component Value Date   WBC 7.7 11/29/2016   HGB 7.4 (L) 11/29/2016   HCT 25.4 (L) 11/29/2016   MCV 73.8 (L) 11/29/2016   PLT 385 11/29/2016      Chemistry      Component Value Date/Time   NA 139 11/29/2016 1149   K 4.3 11/29/2016 1149   CL 101 10/19/2015 1505   CO2 24 11/29/2016 1149   BUN 24.5 11/29/2016 1149   CREATININE 0.9 11/29/2016 1149      Component Value Date/Time   CALCIUM 9.4 11/29/2016 1149   ALKPHOS 58 11/29/2016 1149   AST 13 11/29/2016 1149   ALT 8 11/29/2016 1149   BILITOT 0.42 11/29/2016 1149  RADIOGRAPHIC STUDIES: Ct Chest W Contrast  Result Date: 11/29/2016 CLINICAL DATA:  Followup lung cancer EXAM: CT CHEST WITH CONTRAST TECHNIQUE: Multidetector CT imaging of the chest was performed during intravenous contrast administration. CONTRAST:  3m ISOVUE-300 IOPAMIDOL (ISOVUE-300) INJECTION 61% COMPARISON:  05/19/2016 FINDINGS: Cardiovascular: The heart size is moderately enlarged. There is no pericardial effusion. Aortic atherosclerosis noted. Calcification within the RCA, LAD and left circumflex coronary artery noted. Mediastinum/Nodes: The trachea appears patent and is midline. Unremarkable appearance of the esophagus. Right paratracheal node measures 1 cm, image 54 of series 2. Previously 1.2 cm. Lungs/Pleura: Small right pleural effusion. Extensive post treatment changes from external beam radiation are identified within the right upper lobe and right midlung. Within the anterior  right lower lobe there is a new pulmonary nodule measuring 1.5 by 1.0 cm, image 96 of series 5. This is suspicious for recurrence of disease. New small loculated anterior basal pneumothorax is identified, image 108 of series 5. This measures 4.4 x 7.8 x 1.5 cm, image 109 of series 5 and image 57 of series 7. Upper Abdomen: No acute abnormality. Musculoskeletal: There are no aggressive lytic or sclerotic bone lesions identified. Compression deformity within the lower thoracic spine is unchanged from previous exam. IMPRESSION: 1. There is a new nodule within the right lower lobe which is highly suspicious for recurrence of disease. Consider further evaluation with PET-CT. 2. Interval development of small right-sided loculated anterior basal pneumothorax. 3. Aortic atherosclerosis and multi vessel coronary artery calcification 4. New small right pleural effusion. 5. These results will be called to the ordering clinician or representative by the Radiologist Assistant, and communication documented in the PACS or zVision Dashboard. Electronically Signed   By: TKerby MoorsM.D.   On: 11/29/2016 16:07    ASSESSMENT AND PLAN:  This is a very pleasant 82years old white female with the following medical issues. 1) stage IIIa non-small cell lung cancer, adenocarcinoma status post course of concurrent chemoradiation with weekly carboplatin and paclitaxel with partial response. The patient did not receive consolidation chemotherapy because of her general condition. She is currently on observation but unfortunately the recent CT scan of the chest showed new nodule in the right lower lobe as well as development of a small right-sided loculated anterior basal pneumothorax and a new small right sided loculated pleural effusion. I personally and independently reviewed the scan images and discuss the results with the patient and her daughter. I recommended for the patient to have a PET scan for further evaluation of her  condition and to rule out recurrence of her disease. 2) diarrhea, dehydration and hypotension: I will arrange for the patient to receive 1 L of normal saline in the clinic today. I will also check her stool for C. Difficile. 3) severe anemia: The patient has drop of 4 g of her hemoglobin since her last visit. She denied having any bleeding issues. I will order anemia panel including repeat CBC with reticulocyte, iron study, ferritin, serum folate, B-12. We will also arrange for the patient to have 2 units of PRBCs transfusion. She will come back for follow-up visit in 2 weeks for evaluation and discussion of her PET scan results and further recommendation regarding her condition. The patient her daughter were advised to take her immediately to the emergency department if no improvement in her condition over the next few days. The patient voices understanding of current disease status and treatment options and is in agreement with the current care plan.  All questions  were answered. The patient knows to call the clinic with any problems, questions or concerns. We can certainly see the patient much sooner if necessary.  Disclaimer: This note was dictated with voice recognition software. Similar sounding words can inadvertently be transcribed and may not be corrected upon review.

## 2016-12-06 NOTE — Progress Notes (Signed)
Hemoccult home kit given to pt with instructions.

## 2016-12-06 NOTE — Patient Instructions (Signed)
Dehydration, Adult Dehydration is when there is not enough fluid or water in your body. This happens when you lose more fluids than you take in. Dehydration can range from mild to very bad. It should be treated right away to keep it from getting very bad. Symptoms of mild dehydration may include:   Thirst.  Dry lips.  Slightly dry mouth.  Dry, warm skin.  Dizziness. Symptoms of moderate dehydration may include:   Very dry mouth.  Muscle cramps.  Dark pee (urine). Pee may be the color of tea.  Your body making less pee.  Your eyes making fewer tears.  Heartbeat that is uneven or faster than normal (palpitations).  Headache.  Light-headedness, especially when you stand up from sitting.  Fainting (syncope). Symptoms of very bad dehydration may include:   Changes in skin, such as:  Cold and clammy skin.  Blotchy (mottled) or pale skin.  Skin that does not quickly return to normal after being lightly pinched and let go (poor skin turgor).  Changes in body fluids, such as:  Feeling very thirsty.  Your eyes making fewer tears.  Not sweating when body temperature is high, such as in hot weather.  Your body making very little pee.  Changes in vital signs, such as:  Weak pulse.  Pulse that is more than 100 beats a minute when you are sitting still.  Fast breathing.  Low blood pressure.  Other changes, such as:  Sunken eyes.  Cold hands and feet.  Confusion.  Lack of energy (lethargy).  Trouble waking up from sleep.  Short-term weight loss.  Unconsciousness. Follow these instructions at home:  If told by your doctor, drink an ORS:  Make an ORS by using instructions on the package.  Start by drinking small amounts, about  cup (120 mL) every 5-10 minutes.  Slowly drink more until you have had the amount that your doctor said to have.  Drink enough clear fluid to keep your pee clear or pale yellow. If you were told to drink an ORS, finish the  ORS first, then start slowly drinking clear fluids. Drink fluids such as:  Water. Do not drink only water by itself. Doing that can make the salt (sodium) level in your body get too low (hyponatremia).  Ice chips.  Fruit juice that you have added water to (diluted).  Low-calorie sports drinks.  Avoid:  Alcohol.  Drinks that have a lot of sugar. These include high-calorie sports drinks, fruit juice that does not have water added, and soda.  Caffeine.  Foods that are greasy or have a lot of fat or sugar.  Take over-the-counter and prescription medicines only as told by your doctor.  Do not take salt tablets. Doing that can make the salt level in your body get too high (hypernatremia).  Eat foods that have minerals (electrolytes). Examples include bananas, oranges, potatoes, tomatoes, and spinach.  Keep all follow-up visits as told by your doctor. This is important. Contact a doctor if:  You have belly (abdominal) pain that:  Gets worse.  Stays in one area (localizes).  You have a rash.  You have a stiff neck.  You get angry or annoyed more easily than normal (irritability).  You are more sleepy than normal.  You have a harder time waking up than normal.  You feel:  Weak.  Dizzy.  Very thirsty.  You have peed (urinated) only a small amount of very dark pee during 6-8 hours. Get help right away if:  You   have symptoms of very bad dehydration.  You cannot drink fluids without throwing up (vomiting).  Your symptoms get worse with treatment.  You have a fever.  You have a very bad headache.  You are throwing up or having watery poop (diarrhea) and it:  Gets worse.  Does not go away.  You have blood or something green (bile) in your throw-up.  You have blood in your poop (stool). This may cause poop to look black and tarry.  You have not peed in 6-8 hours.  You pass out (faint).  Your heart rate when you are sitting still is more than 100 beats a  minute.  You have trouble breathing. This information is not intended to replace advice given to you by your health care provider. Make sure you discuss any questions you have with your health care provider. Document Released: 09/10/2009 Document Revised: 06/03/2016 Document Reviewed: 01/08/2016 Elsevier Interactive Patient Education  2017 [employer].  

## 2016-12-07 ENCOUNTER — Ambulatory Visit (HOSPITAL_BASED_OUTPATIENT_CLINIC_OR_DEPARTMENT_OTHER): Payer: Medicare HMO

## 2016-12-07 ENCOUNTER — Telehealth: Payer: Self-pay | Admitting: Internal Medicine

## 2016-12-07 DIAGNOSIS — D649 Anemia, unspecified: Secondary | ICD-10-CM | POA: Diagnosis not present

## 2016-12-07 LAB — PREPARE RBC (CROSSMATCH)

## 2016-12-07 LAB — ERYTHROPOIETIN: ERYTHROPOIETIN: 242.2 m[IU]/mL — AB (ref 2.6–18.5)

## 2016-12-07 LAB — VITAMIN B12: Vitamin B12: 492 pg/mL (ref 232–1245)

## 2016-12-07 LAB — FOLATE: Folate: 16 ng/mL (ref >3.0)

## 2016-12-07 MED ORDER — SODIUM CHLORIDE 0.9 % IV SOLN
250.0000 mL | Freq: Once | INTRAVENOUS | Status: AC
  Administered 2016-12-07: 250 mL via INTRAVENOUS

## 2016-12-07 MED ORDER — ACETAMINOPHEN 325 MG PO TABS
650.0000 mg | ORAL_TABLET | Freq: Once | ORAL | Status: AC
  Administered 2016-12-07: 650 mg via ORAL

## 2016-12-07 MED ORDER — ACETAMINOPHEN 325 MG PO TABS
ORAL_TABLET | ORAL | Status: AC
  Filled 2016-12-07: qty 2

## 2016-12-07 MED ORDER — DIPHENHYDRAMINE HCL 25 MG PO CAPS
ORAL_CAPSULE | ORAL | Status: AC
  Filled 2016-12-07: qty 1

## 2016-12-07 MED ORDER — DIPHENHYDRAMINE HCL 25 MG PO CAPS
25.0000 mg | ORAL_CAPSULE | Freq: Once | ORAL | Status: AC
  Administered 2016-12-07: 25 mg via ORAL

## 2016-12-07 NOTE — Telephone Encounter (Signed)
spoke to patient daughter [name] to confirm appointment on 12/26/16 at 1:45pm

## 2016-12-07 NOTE — Patient Instructions (Signed)
Blood Transfusion , Adult A blood transfusion is a procedure in which you receive donated blood, including plasma, platelets, and red blood cells, through an IV tube. You may need a blood transfusion because of illness, surgery, or injury. The blood may come from a donor. You may also be able to donate blood for yourself (autologous blood donation) before a surgery if you know that you might require a blood transfusion. The blood given in a transfusion is made up of different types of cells. You may receive:  Red blood cells. These carry oxygen to the cells in the body.  White blood cells. These help you fight infections.  Platelets. These help your blood to clot.  Plasma. This is the liquid part of your blood and it helps with fluid imbalances. If you have hemophilia or another clotting disorder, you may also receive other types of blood products. Tell a health care provider about:  Any allergies you have.  All medicines you are taking, including vitamins, herbs, eye drops, creams, and over-the-counter medicines.  Any problems you or family members have had with anesthetic medicines.  Any blood disorders you have.  Any surgeries you have had.  Any medical conditions you have, including any recent fever or cold symptoms.  Whether you are pregnant or may be pregnant.  Any previous reactions you have had during a blood transfusion. What are the risks? Generally, this is a safe procedure. However, problems may occur, including:  Having an allergic reaction to something in the donated blood. Hives and itching may be symptoms of this type of reaction.  Fever. This may be a reaction to the white blood cells in the transfused blood. Nausea or chest pain may accompany a fever.  Iron overload. This can happen from having many transfusions.  Transfusion-related acute lung injury (TRALI). This is a rare reaction that causes lung damage. The cause is not known.TRALI can occur within hours  of a transfusion or several days later.  Sudden (acute) or delayed hemolytic reactions. This happens if your blood does not match the cells in your transfusion. Your body's defense system (immune system) may try to attack the new cells. This complication is rare. The symptoms include fever, chills, nausea, and low back pain or chest pain.  Infection or disease transmission. This is rare. What happens before the procedure?  You will have a blood test to determine your blood type. This is necessary to know what kind of blood your body will accept and to match it to the donor blood.  If you are going to have a planned surgery, you may be able to do an autologous blood donation. This may be done in case you need to have a transfusion.  If you have had an allergic reaction to a transfusion in the past, you may be given medicine to help prevent a reaction. This medicine may be given to you by mouth or through an IV tube.  You will have your temperature, blood pressure, and pulse monitored before the transfusion.  Follow instructions from your health care provider about eating and drinking restrictions.  Ask your health care provider about:  Changing or stopping your regular medicines. This is especially important if you are taking diabetes medicines or blood thinners.  Taking medicines such as aspirin and ibuprofen. These medicines can thin your blood. Do not take these medicines before your procedure if your health care provider instructs you not to. What happens during the procedure?  An IV tube will be   inserted into one of your veins.  The bag of donated blood will be attached to your IV tube. The blood will then enter through your vein.  Your temperature, blood pressure, and pulse will be monitored regularly during the transfusion. This monitoring is done to detect early signs of a transfusion reaction.  If you have any signs or symptoms of a reaction, your transfusion will be stopped and  you may be given medicine.  When the transfusion is complete, your IV tube will be removed.  Pressure may be applied to the IV site for a few minutes.  A bandage (dressing) will be applied. The procedure may vary among health care providers and hospitals. What happens after the procedure?  Your temperature, blood pressure, heart rate, breathing rate, and blood oxygen level will be monitored often.  Your blood may be tested to see how you are responding to the transfusion.  You may be warmed with fluids or blankets to maintain a normal body temperature. Summary  A blood transfusion is a procedure in which you receive donated blood, including plasma, platelets, and red blood cells, through an IV tube.  Your temperature, blood pressure, and pulse will be monitored before, during, and after the transfusion.  Your blood may be tested after the transfusion to see how your body has responded. This information is not intended to replace advice given to you by your health care provider. Make sure you discuss any questions you have with your health care provider. Document Released: 11/11/2000 Document Revised: 08/11/2016 Document Reviewed: 08/11/2016 Elsevier Interactive Patient Education  2017 [employer]vier Inc.  

## 2016-12-08 ENCOUNTER — Other Ambulatory Visit: Payer: Self-pay | Admitting: Nurse Practitioner

## 2016-12-08 LAB — TYPE AND SCREEN
ABO/RH(D): B NEG
Antibody Screen: POSITIVE
DAT, IGG: NEGATIVE
DONOR AG TYPE: NEGATIVE
Donor AG Type: NEGATIVE
UNIT DIVISION: 0
Unit division: 0

## 2016-12-09 ENCOUNTER — Other Ambulatory Visit: Payer: Self-pay | Admitting: Medical Oncology

## 2016-12-09 ENCOUNTER — Ambulatory Visit: Payer: Medicare HMO

## 2016-12-09 DIAGNOSIS — D649 Anemia, unspecified: Secondary | ICD-10-CM

## 2016-12-09 LAB — FECAL OCCULT BLOOD, GUAIAC: Occult Blood: POSITIVE

## 2016-12-12 ENCOUNTER — Other Ambulatory Visit: Payer: Self-pay | Admitting: Internal Medicine

## 2016-12-12 ENCOUNTER — Telehealth: Payer: Self-pay

## 2016-12-12 DIAGNOSIS — D649 Anemia, unspecified: Secondary | ICD-10-CM

## 2016-12-12 NOTE — Telephone Encounter (Signed)
Pa initiated via [email or web address]

## 2016-12-15 ENCOUNTER — Ambulatory Visit: Payer: Medicare HMO | Admitting: Physician Assistant

## 2016-12-16 ENCOUNTER — Other Ambulatory Visit: Payer: Self-pay | Admitting: Family

## 2016-12-17 NOTE — Telephone Encounter (Signed)
PA was denied due to dx for [facility]. Dx used was allergic rhinitis.   Please advise if there is an alternative.

## 2016-12-20 NOTE — Telephone Encounter (Signed)
Discontinue Dulera with Breo sent in.

## 2016-12-20 NOTE — Addendum Note (Signed)
Addended by: Mauricio Po D on: 12/20/2016 11:26 PM   Modules accepted: Orders

## 2016-12-21 NOTE — Telephone Encounter (Signed)
LVM for pt to call back as soon as possible.   RE: PCP changed medications.

## 2016-12-22 ENCOUNTER — Other Ambulatory Visit (INDEPENDENT_AMBULATORY_CARE_PROVIDER_SITE_OTHER): Payer: Medicare HMO

## 2016-12-22 ENCOUNTER — Encounter: Payer: Self-pay | Admitting: Gastroenterology

## 2016-12-22 ENCOUNTER — Ambulatory Visit (HOSPITAL_COMMUNITY)
Admission: RE | Admit: 2016-12-22 | Discharge: 2016-12-22 | Disposition: A | Discharge status: Home / Self Care | Payer: Medicare HMO | Source: Ambulatory Visit | Attending: Internal Medicine | Admitting: Internal Medicine

## 2016-12-22 ENCOUNTER — Ambulatory Visit: Payer: Medicare HMO | Admitting: Internal Medicine

## 2016-12-22 ENCOUNTER — Ambulatory Visit (INDEPENDENT_AMBULATORY_CARE_PROVIDER_SITE_OTHER): Payer: Medicare HMO | Admitting: Gastroenterology

## 2016-12-22 ENCOUNTER — Telehealth: Payer: Self-pay

## 2016-12-22 ENCOUNTER — Other Ambulatory Visit: Payer: Medicare HMO

## 2016-12-22 VITALS — BP 100/52 | HR 84 | Ht 65.0 in | Wt 189.0 lb

## 2016-12-22 DIAGNOSIS — R195 Other fecal abnormalities: Secondary | ICD-10-CM | POA: Diagnosis not present

## 2016-12-22 DIAGNOSIS — D539 Nutritional anemia, unspecified: Secondary | ICD-10-CM | POA: Insufficient documentation

## 2016-12-22 DIAGNOSIS — R197 Diarrhea, unspecified: Secondary | ICD-10-CM | POA: Diagnosis not present

## 2016-12-22 DIAGNOSIS — N281 Cyst of kidney, acquired: Secondary | ICD-10-CM | POA: Insufficient documentation

## 2016-12-22 DIAGNOSIS — R918 Other nonspecific abnormal finding of lung field: Secondary | ICD-10-CM | POA: Insufficient documentation

## 2016-12-22 DIAGNOSIS — D509 Iron deficiency anemia, unspecified: Secondary | ICD-10-CM

## 2016-12-22 DIAGNOSIS — I2510 Atherosclerotic heart disease of native coronary artery without angina pectoris: Secondary | ICD-10-CM | POA: Diagnosis not present

## 2016-12-22 DIAGNOSIS — K5730 Diverticulosis of large intestine without perforation or abscess without bleeding: Secondary | ICD-10-CM | POA: Insufficient documentation

## 2016-12-22 DIAGNOSIS — E860 Dehydration: Secondary | ICD-10-CM | POA: Diagnosis not present

## 2016-12-22 DIAGNOSIS — J439 Emphysema, unspecified: Secondary | ICD-10-CM | POA: Insufficient documentation

## 2016-12-22 DIAGNOSIS — I700 Atherosclerosis of aorta: Secondary | ICD-10-CM | POA: Diagnosis not present

## 2016-12-22 DIAGNOSIS — J90 Pleural effusion, not elsewhere classified: Secondary | ICD-10-CM | POA: Diagnosis not present

## 2016-12-22 DIAGNOSIS — Z7901 Long term (current) use of anticoagulants: Secondary | ICD-10-CM | POA: Diagnosis not present

## 2016-12-22 DIAGNOSIS — C3411 Malignant neoplasm of upper lobe, right bronchus or lung: Secondary | ICD-10-CM | POA: Insufficient documentation

## 2016-12-22 LAB — CBC WITH DIFFERENTIAL/PLATELET
BASOS ABS: 0 10*3/uL (ref 0.0–0.1)
Basophils Relative: 0.4 % (ref 0.0–3.0)
EOS ABS: 0.1 10*3/uL (ref 0.0–0.7)
Eosinophils Relative: 1.2 % (ref 0.0–5.0)
HCT: 31.2 % — ABNORMAL LOW (ref 36.0–46.0)
Hemoglobin: 9.9 g/dL — ABNORMAL LOW (ref 12.0–15.0)
LYMPHS ABS: 1 10*3/uL (ref 0.7–4.0)
Lymphocytes Relative: 12.4 % (ref 12.0–46.0)
MCHC: 31.7 g/dL (ref 30.0–36.0)
MCV: 73.9 fl — AB (ref 78.0–100.0)
Monocytes Absolute: 0.5 10*3/uL (ref 0.1–1.0)
Monocytes Relative: 6 % (ref 3.0–12.0)
NEUTROS ABS: 6.7 10*3/uL (ref 1.4–7.7)
Neutrophils Relative %: 80 % — ABNORMAL HIGH (ref 43.0–77.0)
PLATELETS: 369 10*3/uL (ref 150.0–400.0)
RBC: 4.22 Mil/uL (ref 3.87–5.11)
RDW: 20.6 % — ABNORMAL HIGH (ref 11.5–15.5)
WBC: 8.4 10*3/uL (ref 4.0–10.5)

## 2016-12-22 LAB — GLUCOSE, CAPILLARY: GLUCOSE-CAPILLARY: 134 mg/dL — AB (ref 65–99)

## 2016-12-22 MED ORDER — FLUDEOXYGLUCOSE F - 18 (FDG) INJECTION
9.4100 | Freq: Once | INTRAVENOUS | Status: AC | PRN
  Administered 2016-12-22: 9.41 via INTRAVENOUS

## 2016-12-22 NOTE — Telephone Encounter (Signed)
  12/22/2016   RE: [facility] DOB: [birth year] MRN: [id number]   Dear Debara Pickett,    We have scheduled the above patient for an endoscopic procedure. Our records show that she is on anticoagulation therapy.   Please advise as to how long the patient may come off her therapy of Eliquis prior to the procedure, which is scheduled for 01/13/2017.  Please fax back/ or route the completed form to [facility] at [phone].   Sincerely,    Phillis Haggis

## 2016-12-22 NOTE — Patient Instructions (Signed)
Your physician has requested that you go to the basement for the following lab work before leaving today:  [place] have been scheduled for an endoscopy and colonoscopy at [facility]. Please follow the written instructions given to you at your visit today. Please pick up your prep supplies at the pharmacy within the next 1-3 days.

## 2016-12-22 NOTE — Progress Notes (Addendum)
12/22/2016 Pamela Mcguire [id number] 1935/03/05   HISTORY OF PRESENT ILLNESS:  This is a 82 year old female who is new to our practice. She has been referred here by her oncologist, Dr. Earlie Server, for evaluation of IDA and heme positive stools. She has a history of breast cancer and also currently has Stage IIIA non-small cell lung cancer.  She underwent chemoradiation therapy with her last chemotherapy given in July 2016. Currently on observation. She has never seen a GI doctor for colonoscopy in the past.  Two weeks ago she was found have a hemoglobin of 7.0 g. Seven months ago her hemoglobin was normal at 11.6 g. The patient denies any black or bloody stools. She was Hemoccult positive on 3 separate occasions recently. Iron studies are low as well with a ferritin of 15, serum iron 21, and iron saturation of 5%.  She was transfused with 2 units of packed red blood cells after this last hemoglobin.  Denies any GI complaints.    She is on Eliquis for PAF, which is prescribed by Dr. Debara Pickett.  Has O2 at home but does not use it often.  Past Medical History:  Diagnosis Date  . AF (atrial fibrillation) ([place])   . Anxiety   . Arthritis    "thighs, hips, arms" (04/28/2015)  . Breast cancer ([place])    right mastectomy  . CAD (coronary artery disease)   . Dehydration 12/06/2016  . Depression   . Diverticulitis   . GERD (gastroesophageal reflux disease)   . Hyperlipidemia   . Hypertension   . Non-small cell carcinoma of lung, stage 3 ([place]) 04/30/2015  . Prediabetes   . Seasonal allergies   . Shortness of breath dyspnea   . Sleep apnea     dx'd 10/2014, has not received cpap yet (04/28/2015)  . Urinary frequency    Past Surgical History:  Procedure Laterality Date  . BREAST SURGERY    . CARDIOVERSION    . CESAREAN SECTION  X 2  . COLONOSCOPY    . MASTECTOMY Right   . MEDIASTINOSCOPY N/A 04/28/2015   Procedure: MEDIASTINOSCOPY;  Surgeon: [name], MD;  Location: [facility] OR;  Service: Thoracic;   Laterality: N/A;  . TONSILLECTOMY      reports that she quit smoking about 24 years ago. Her smoking use included Cigarettes. She has a 35.00 pack-year smoking history. She has never used smokeless tobacco. She reports that she drinks about 5.4 oz of alcohol per week . She reports that she does not use drugs. family history includes Mental illness in her mother. No Known Allergies    Outpatient Encounter Prescriptions as of 12/22/2016  Medication Sig  . acetaminophen (TYLENOL) 500 MG tablet Take 1,000 mg by mouth every 8 (eight) hours as needed. Reported on 06/01/2016  . albuterol (PROVENTIL HFA;VENTOLIN HFA) 108 (90 Base) MCG/ACT inhaler Inhale 1 puff into the lungs every 6 (six) hours as needed for wheezing or shortness of breath. Reported on 06/01/2016  . atorvastatin (LIPITOR) 40 MG tablet Take 1 tablet (40 mg total) by mouth daily.  . betamethasone dipropionate (DIPROLENE) 0.05 % cream   . budesonide-formoterol (SYMBICORT) 160-4.5 MCG/ACT inhaler Inhale 2 puffs into the lungs 2 (two) times daily as needed (For wheezing, wait 1 min between each puff).  Marland Kitchen buPROPion (WELLBUTRIN SR) 100 MG 12 hr tablet TAKE 1 TABLET(100 MG) BY MOUTH DAILY  . carvedilol (COREG) 3.125 MG tablet TAKE 1 TABLET(3.125 MG) BY MOUTH TWICE DAILY  . Cranberry-Vitamin C-Probiotic (AZO CRANBERRY  PO) Take by mouth.  . diphenhydramine-acetaminophen (TYLENOL PM) 25-500 MG TABS tablet Take 1 tablet by mouth at bedtime as needed. Reported on 06/01/2016  . ELIQUIS 5 MG TABS tablet TAKE 1 TABLET BY MOUTH TWICE DAILY  . escitalopram (LEXAPRO) 10 MG tablet TAKE 1 TABLET(10 MG) BY MOUTH DAILY  . feeding supplement (BOOST / RESOURCE BREEZE) LIQD Take 1 Container by mouth 3 (three) times daily between meals.  . FeFum-FePoly-FA-B Cmp-C-Biot (INTEGRA PLUS) CAPS One cap po qd  . fluticasone furoate-vilanterol (BREO ELLIPTA) 100-25 MCG/INH AEPB Inhale 1 puff into the lungs daily.  . folic acid (FOLVITE) 1 MG tablet TAKE 1 TABLET BY MOUTH  TWICE DAILY  . loratadine (CLARITIN) 10 MG tablet TAKE 1 TABLET BY MOUTH AT BEDTIME  . LORazepam (ATIVAN) 1 MG tablet Take 1 tablet (1 mg total) by mouth every 8 (eight) hours as needed. for anxiety  . Melatonin 10 MG CAPS Take by mouth.  . pantoprazole (PROTONIX) 40 MG tablet TAKE 1 TABLET BY MOUTH EVERY DAY  . UNABLE TO FIND Med Name: Wig  . [DISCONTINUED] triamcinolone cream (KENALOG) 0.1 %    No facility-administered encounter medications on file as of 12/22/2016.      REVIEW OF SYSTEMS  : All other systems reviewed and negative except where noted in the History of Present Illness.   PHYSICAL EXAM: BP (!) 100/52   Pulse 84   Ht '5\' 5"'$  (1.651 m)   Wt 189 lb (85.7 kg)   BMI 31.45 kg/m  General: Well developed white female in no acute distress; in wheelchair Head: Normocephalic and atraumatic Eyes:  Sclerae anicteric ,conjunctiva pink. Ears: Normal auditory acuity Lungs: Clear throughout to auscultation; no increased WOB. Heart: Regular rate and rhythm Abdomen: Soft, non-distended. Normal bowel sounds.  Non-tender. Rectal:  Will be done at the time of colonoscopy. Musculoskeletal: Symmetrical with no gross deformities  Skin: No lesions on visible extremities Extremities: No edema  Neurological: Alert oriented x 4, grossly non-focal Psychological:  Alert and cooperative. Normal mood and affect  ASSESSMENT AND PLAN: -IDA and heme positive stools:  No overt bleeding.  Hgb down 4 grams over the past 7 months.  No overt bleeding.  Iron studies low.  Never had GI evaluation with EGD or colonoscopy.  Will schedule EGD and colonoscopy with Dr. Carlean Purl at the hospital due to her intermittent O2 use.  Will recheck CBC today. -Chronic anticoagulation on Eliquis for PAF:  Will hold Eliquis for 2 days prior to endoscopic procedures - will instruct when and how to resume after procedure. Benefits and risks of procedure explained including risks of bleeding, perforation, infection, missed  lesions, reactions to medications and possible need for hospitalization and surgery for complications. Additional rare but real risk of stroke or other vascular clotting events off Eliquis also explained and need to seek urgent help if any signs of these problems occur. Will communicate by phone or EMR with patient's prescribing provider, Dr. Debara Pickett, to confirm that holding Eliquis is reasonable in this case.    CC:  Curt Bears, MD  Agree with Ms. Zehr's management.  Gatha Mayer, MD, [facility]

## 2016-12-23 NOTE — Telephone Encounter (Signed)
Patient called and clarified that the message regarding Eliquis had been received.

## 2016-12-23 NOTE — Telephone Encounter (Signed)
Ok to hold Eliquis 3 days prior to procedure and restart >24 hours after.  Dr. Debara Pickett

## 2016-12-26 ENCOUNTER — Ambulatory Visit (HOSPITAL_BASED_OUTPATIENT_CLINIC_OR_DEPARTMENT_OTHER): Payer: Medicare HMO | Admitting: Internal Medicine

## 2016-12-26 ENCOUNTER — Encounter: Payer: Self-pay | Admitting: Internal Medicine

## 2016-12-26 ENCOUNTER — Telehealth: Payer: Self-pay | Admitting: Internal Medicine

## 2016-12-26 VITALS — BP 108/55 | HR 88 | Temp 98.1°F | Resp 17 | Wt 185.8 lb

## 2016-12-26 DIAGNOSIS — D509 Iron deficiency anemia, unspecified: Secondary | ICD-10-CM

## 2016-12-26 DIAGNOSIS — C3431 Malignant neoplasm of lower lobe, right bronchus or lung: Secondary | ICD-10-CM | POA: Diagnosis not present

## 2016-12-26 DIAGNOSIS — I10 Essential (primary) hypertension: Secondary | ICD-10-CM

## 2016-12-26 DIAGNOSIS — C3410 Malignant neoplasm of upper lobe, unspecified bronchus or lung: Secondary | ICD-10-CM

## 2016-12-26 DIAGNOSIS — C3411 Malignant neoplasm of upper lobe, right bronchus or lung: Secondary | ICD-10-CM | POA: Diagnosis not present

## 2016-12-26 NOTE — Progress Notes (Signed)
[facility] [phone]   [phone]  OFFICE PROGRESS NOTE  Mauricio Po, FNP [place] [address]  DIAGNOSIS: Stage IIIA (T1a, N2, M0) non-small cell lung cancer Non-small cell carcinoma of lung, adenocarcinoma presented with right upper lobe lung mass and mediastinal lymphadenopathy diagnosed in May 2016.  PRIOR THERAPY: Concurrent chemoradiation with chemotherapy in the form of weekly carboplatin for an AUC of 2 and paclitaxel at 45 mg/m given concurrent with radiation. Last dose was given 06/18/2015 with partial response.  CURRENT THERAPY: Observation.  INTERVAL HISTORY: Pamela Mcguire 82 y.o. female came to the clinic today accompanied by her daughter for follow-up visit. The patient is feeling fine today except for the baseline shortness of breath and generalized fatigue. She denied having any chest pain, cough or hemoptysis. She has no nausea, vomiting, diarrhea or constipation. She denied having any significant weight loss or night sweats. She has no fever or chills. She has no headache or visual changes. She was found on previous CT scan of the chest to have questionable nodule in the right lung suspicious for disease recurrence. I ordered a PET scan which was performed recently and the patient is here today for evaluation and discussion of her scan results and treatment options.  MEDICAL HISTORY: Past Medical History:  Diagnosis Date  . AF (atrial fibrillation) ([place])   . Anxiety   . Arthritis    "thighs, hips, arms" (04/28/2015)  . Breast cancer ([place])    right mastectomy  . CAD (coronary artery disease)   . Dehydration 12/06/2016  . Depression   . Diverticulitis   . GERD (gastroesophageal reflux disease)   . Hyperlipidemia   . Hypertension   . Non-small cell carcinoma of lung, stage 3 ([place]) 04/30/2015  . Prediabetes   . Seasonal allergies   . Shortness of breath dyspnea   . Sleep apnea     dx'd 10/2014, has not  received cpap yet (04/28/2015)  . Urinary frequency     ALLERGIES:  has No Known Allergies.  MEDICATIONS:  Current Outpatient Prescriptions  Medication Sig Dispense Refill  . acetaminophen (TYLENOL) 500 MG tablet Take 1,000 mg by mouth every 8 (eight) hours as needed. Reported on 06/01/2016    . albuterol (PROVENTIL HFA;VENTOLIN HFA) 108 (90 Base) MCG/ACT inhaler Inhale 1 puff into the lungs every 6 (six) hours as needed for wheezing or shortness of breath. Reported on 06/01/2016 18 g 1  . atorvastatin (LIPITOR) 40 MG tablet Take 1 tablet (40 mg total) by mouth daily. 90 tablet 3  . betamethasone dipropionate (DIPROLENE) 0.05 % cream   3  . budesonide-formoterol (SYMBICORT) 160-4.5 MCG/ACT inhaler Inhale 2 puffs into the lungs 2 (two) times daily as needed (For wheezing, wait 1 min between each puff).    Marland Kitchen buPROPion (WELLBUTRIN SR) 100 MG 12 hr tablet TAKE 1 TABLET(100 MG) BY MOUTH DAILY 90 tablet 0  . carvedilol (COREG) 3.125 MG tablet TAKE 1 TABLET(3.125 MG) BY MOUTH TWICE DAILY 180 tablet 2  . Cranberry-Vitamin C-Probiotic (AZO CRANBERRY PO) Take by mouth.    . diphenhydramine-acetaminophen (TYLENOL PM) 25-500 MG TABS tablet Take 1 tablet by mouth at bedtime as needed. Reported on 06/01/2016    . ELIQUIS 5 MG TABS tablet TAKE 1 TABLET BY MOUTH TWICE DAILY 180 tablet 2  . escitalopram (LEXAPRO) 10 MG tablet TAKE 1 TABLET(10 MG) BY MOUTH DAILY 90 tablet 0  . feeding supplement (BOOST / RESOURCE BREEZE) LIQD Take  1 Container by mouth 3 (three) times daily between meals. 90 Container 0  . FeFum-FePoly-FA-B Cmp-C-Biot (INTEGRA PLUS) CAPS One cap po qd 30 capsule 2  . fluticasone furoate-vilanterol (BREO ELLIPTA) 100-25 MCG/INH AEPB Inhale 1 puff into the lungs daily. 28 each 3  . folic acid (FOLVITE) 1 MG tablet TAKE 1 TABLET BY MOUTH TWICE DAILY 60 tablet 5  . loratadine (CLARITIN) 10 MG tablet TAKE 1 TABLET BY MOUTH AT BEDTIME 90 tablet 0  . LORazepam (ATIVAN) 1 MG tablet Take 1 tablet (1 mg total)  by mouth every 8 (eight) hours as needed. for anxiety 45 tablet 0  . Melatonin 10 MG CAPS Take by mouth.    . pantoprazole (PROTONIX) 40 MG tablet TAKE 1 TABLET BY MOUTH EVERY DAY 30 tablet 5  . UNABLE TO FIND Med Name: Wig 1 each 0   No current facility-administered medications for this visit.     SURGICAL HISTORY:  Past Surgical History:  Procedure Laterality Date  . BREAST SURGERY    . CARDIOVERSION    . CESAREAN SECTION  X 2  . COLONOSCOPY    . MASTECTOMY Right   . MEDIASTINOSCOPY N/A 04/28/2015   Procedure: MEDIASTINOSCOPY;  Surgeon: [name], MD;  Location: [facility] OR;  Service: Thoracic;  Laterality: N/A;  . TONSILLECTOMY      REVIEW OF SYSTEMS:  Constitutional: positive for fatigue Eyes: negative Ears, nose, mouth, throat, and face: negative Respiratory: positive for dyspnea on exertion Cardiovascular: negative Gastrointestinal: negative Genitourinary:negative Integument/breast: negative Hematologic/lymphatic: negative Musculoskeletal:negative Neurological: negative Behavioral/Psych: negative Endocrine: negative Allergic/Immunologic: negative   PHYSICAL EXAMINATION: General appearance: alert, cooperative, fatigued, no distress and pale Head: Normocephalic, without obvious abnormality, atraumatic Neck: no adenopathy, no JVD, supple, symmetrical, trachea midline and thyroid not enlarged, symmetric, no tenderness/mass/nodules Lymph nodes: Cervical, supraclavicular, and axillary nodes normal. Resp: wheezes bilaterally Back: symmetric, no curvature. ROM normal. No CVA tenderness. Cardio: regular rate and rhythm, S1, S2 normal, no murmur, click, rub or gallop GI: soft, non-tender; bowel sounds normal; no masses,  no organomegaly Extremities: extremities normal, atraumatic, no cyanosis or edema Neurologic: Alert and oriented X 3, normal strength and tone. Normal symmetric reflexes. Normal coordination and gait  ECOG PERFORMANCE STATUS: 2 - Symptomatic, <50% confined to  bed  Blood pressure (!) 108/55, pulse 88, temperature 98.1 F (36.7 C), temperature source Oral, resp. rate 17, weight 185 lb 12.8 oz (84.3 kg), SpO2 93 %.  LABORATORY DATA: Lab Results  Component Value Date   WBC 8.4 12/22/2016   HGB 9.9 (L) 12/22/2016   HCT 31.2 (L) 12/22/2016   MCV 73.9 (L) 12/22/2016   PLT 369.0 12/22/2016      Chemistry      Component Value Date/Time   NA 139 11/29/2016 1149   K 4.3 11/29/2016 1149   CL 101 10/19/2015 1505   CO2 24 11/29/2016 1149   BUN 24.5 11/29/2016 1149   CREATININE 0.9 11/29/2016 1149      Component Value Date/Time   CALCIUM 9.4 11/29/2016 1149   ALKPHOS 58 11/29/2016 1149   AST 13 11/29/2016 1149   ALT 8 11/29/2016 1149   BILITOT 0.42 11/29/2016 1149       RADIOGRAPHIC STUDIES: Ct Chest W Contrast  Result Date: 11/29/2016 CLINICAL DATA:  Followup lung cancer EXAM: CT CHEST WITH CONTRAST TECHNIQUE: Multidetector CT imaging of the chest was performed during intravenous contrast administration. CONTRAST:  28m ISOVUE-300 IOPAMIDOL (ISOVUE-300) INJECTION 61% COMPARISON:  05/19/2016 FINDINGS: Cardiovascular: The heart size is moderately  enlarged. There is no pericardial effusion. Aortic atherosclerosis noted. Calcification within the RCA, LAD and left circumflex coronary artery noted. Mediastinum/Nodes: The trachea appears patent and is midline. Unremarkable appearance of the esophagus. Right paratracheal node measures 1 cm, image 54 of series 2. Previously 1.2 cm. Lungs/Pleura: Small right pleural effusion. Extensive post treatment changes from external beam radiation are identified within the right upper lobe and right midlung. Within the anterior right lower lobe there is a new pulmonary nodule measuring 1.5 by 1.0 cm, image 96 of series 5. This is suspicious for recurrence of disease. New small loculated anterior basal pneumothorax is identified, image 108 of series 5. This measures 4.4 x 7.8 x 1.5 cm, image 109 of series 5 and image 57  of series 7. Upper Abdomen: No acute abnormality. Musculoskeletal: There are no aggressive lytic or sclerotic bone lesions identified. Compression deformity within the lower thoracic spine is unchanged from previous exam. IMPRESSION: 1. There is a new nodule within the right lower lobe which is highly suspicious for recurrence of disease. Consider further evaluation with PET-CT. 2. Interval development of small right-sided loculated anterior basal pneumothorax. 3. Aortic atherosclerosis and multi vessel coronary artery calcification 4. New small right pleural effusion. 5. These results will be called to the ordering clinician or representative by the Radiologist Assistant, and communication documented in the PACS or zVision Dashboard. Electronically Signed   By: Kerby Moors M.D.   On: 11/29/2016 16:07   Nm Pet Image Restag (ps) Skull Base To Thigh  Result Date: 12/22/2016 CLINICAL DATA:  Subsequent treatment strategy for lung cancer. EXAM: NUCLEAR MEDICINE PET SKULL BASE TO THIGH TECHNIQUE: 9.4 mCi F-18 FDG was injected intravenously. Full-ring PET imaging was performed from the skull base to thigh after the radiotracer. CT data was obtained and used for attenuation correction and anatomic localization. FASTING BLOOD GLUCOSE:  Value: 134 mg/dl COMPARISON:  04/10/2015 FINDINGS: NECK No hypermetabolic lymph nodes in the neck. CHEST The pretracheal mass currently measures 1.7 by 1.2 cm on image 58/4 (formerly 3.5 by 3.8 cm) and has a maximum standard uptake value of 5.7 (formerly the 16.7). There is right perihilar airspace opacity favoring radiation pneumonitis, the previous right hilar lymph node is no longer hypermetabolic or visible outside of the underlying radiation pneumonitis. There is a triangular focus of hypermetabolic activity anteriorly in the right upper lobe measuring 2.1 by 1.7 cm on image 59/4, maximum SUV 14.4. I suspect that this may represent the previous 1.8 by 1.3 cm lesion which is not  deviated medially due to atelectasis, this previously had a maximum SUV of 10.3. There is a right lower lobe nodule anteriorly just posterior to the major fissure which measures 1.6 by 0.9 cm on image 76/4, this is new compared to the prior exam and has a maximum standard uptake value of 10.4. There is a small right pleural effusion with low-grade activity with representative SUV 3.6, and malignant effusion is a concern. Airspace opacity in the right upper lobe probably from radiation pneumonitis or fibrosis, less likely pneumonia. Emphysema. Coronary, aortic arch, and branch vessel atherosclerotic vascular disease. Mild cardiomegaly. Right mastectomy and right axillary dissection. ABDOMEN/PELVIS Sigmoid diverticulosis. Accentuated metabolic activity around a apparent fistula or large thick-walled diverticular outpouching extending from the sigmoid colon to the left adnexa, images 154-158 series 4. This is stable from the prior exam and presumably a chronic residua from appendicitis. Maximum SUV in this vicinity 10.9, formerly 10.0, and presumably from chronic inflammation and physiologic activity rather than  underlying malignancy. There is some regional high activity in the ascending colon which was not present previously for which is probably due to peristaltic activity, just above the level of the ileocecal valve, maximum SUV 20.6. Right mid kidney photopenic cyst. Aortoiliac atherosclerotic vascular disease. SKELETON No focal hypermetabolic activity to suggest skeletal metastasis. IMPRESSION: 1. Right perihilar and upper lobe airspace opacities likely from radiation pneumonitis and fibrosis, with resolution of the hypermetabolic activity in the right hilum, and prominent reduction in the size and metabolic activity of the pretracheal mass/node. 2. However, there is a new pulmonary nodule in the right lower lobe near the hemidiaphragm, as well as a small right pleural effusion with faint accentuated metabolic  activity concerning for the possibility of malignant effusion. Also, there is a triangular opacity anteriorly in the right upper lobe which is hypermetabolic and concerning for active malignancy, and may represent medial deviation of the prior right upper lobe nodule due to atelectasis/volume loss. 3. No findings of metastatic disease to the neck, abdomen, pelvis, or skeletal system. 4. Chronic large lateral outpouching from the sigmoid colon extends to the left adnexa and remains hypermetabolic, this is probably from chronic inflammation related to prior diverticulitis which has formed a large diverticulum. 5. High focal activity in the ascending colon near the ileocecal valve, probably physiologic/peristaltic given the lack of abnormality in this region on the prior exam. 6. Other imaging findings of potential clinical significance: Coronary, aortic arch, and branch vessel atherosclerotic vascular disease. Aortoiliac atherosclerotic vascular disease. Right mid kidney cyst. Emphysema. Sigmoid diverticulosis. Electronically Signed   By: Van Clines M.D.   On: 12/22/2016 15:09    ASSESSMENT AND PLAN:  This is a very pleasant 82 years old white female with stage IIIa non-small cell lung cancer, adenocarcinoma status post a course of concurrent chemoradiation with weekly carboplatin and paclitaxel with partial response. The patient did not receive consolidation chemotherapy secondary to her poor general condition. She has been on observation but the recent CT scan of the chest showed new nodule in the right lower lobe as well as small right-sided loculated pneumothorax and a small right loculated pleural effusion. The PET scan confirmed hypermetabolic activity in the right lower lobe pulmonary nodule as well as suspicious malignant pleural effusion. I personally and independently reviewed the scan images. I discussed the results with the patient and her daughter. I discussed with the patient several  options for treatment of her condition including continuous observation and close monitoring versus consideration of systemic chemotherapy with carboplatin and Alimta versus consideration of treatment with immunotherapy. After discussion of all the options, the patient preferred to continue on observation for now. I would see her back for follow-up visit in 3 months for evaluation with repeat CT scan of the chest. For the severe iron deficiency anemia, the patient received 2 units of PRBCs transfusion. She was referred to gastroenterology for consideration of colonoscopy and evaluation of her anemia. I also started the patient on Integra +1 capsule by mouth daily. 4 hypertension she would continue his current treatment with Coreg.  She was advised to call immediately if she has any concerning symptoms in the interval.  The patient voices understanding of current disease status and treatment options and is in agreement with the current care plan.  All questions were answered. The patient knows to call the clinic with any problems, questions or concerns. We can certainly see the patient much sooner if necessary.  Disclaimer: This note was dictated with voice recognition software.  Similar sounding words can inadvertently be transcribed and may not be corrected upon review.

## 2016-12-26 NOTE — Telephone Encounter (Signed)
Appointments scheduled per 1/29 LOS. Patient given AVS report and calendars with future scheduled appointments.

## 2016-12-30 ENCOUNTER — Telehealth: Payer: Self-pay | Admitting: Family

## 2016-12-30 NOTE — Telephone Encounter (Signed)
Patient requesting refill on oxycontin

## 2017-01-01 MED ORDER — OXYCODONE HCL 5 MG PO TABS: 5.0000 mg | ORAL_TABLET | ORAL | 0 refills | Status: DC | PRN

## 2017-01-01 NOTE — Telephone Encounter (Signed)
Medication refilled. Will need office visit for additional refills. She will also need a UDS at pick up please.

## 2017-01-02 NOTE — Telephone Encounter (Signed)
LVM letting pt know med is ready for pick up up front

## 2017-01-06 ENCOUNTER — Encounter (HOSPITAL_COMMUNITY): Payer: Self-pay | Admitting: *Deleted

## 2017-01-13 ENCOUNTER — Encounter (HOSPITAL_COMMUNITY)
Admission: RE | Disposition: A | Discharge status: Home / Self Care | Payer: Self-pay | Source: Ambulatory Visit | Attending: Internal Medicine

## 2017-01-13 ENCOUNTER — Ambulatory Visit (HOSPITAL_COMMUNITY)
Admission: RE | Admit: 2017-01-13 | Discharge: 2017-01-13 | Disposition: A | Discharge status: Home / Self Care | Payer: Medicare HMO | Source: Ambulatory Visit | Attending: Internal Medicine | Admitting: Internal Medicine

## 2017-01-13 ENCOUNTER — Encounter (HOSPITAL_COMMUNITY): Payer: Self-pay

## 2017-01-13 ENCOUNTER — Ambulatory Visit (HOSPITAL_COMMUNITY): Payer: Medicare HMO | Admitting: Anesthesiology

## 2017-01-13 ENCOUNTER — Other Ambulatory Visit: Payer: Self-pay | Admitting: Family

## 2017-01-13 DIAGNOSIS — Z853 Personal history of malignant neoplasm of breast: Secondary | ICD-10-CM | POA: Insufficient documentation

## 2017-01-13 DIAGNOSIS — Z7901 Long term (current) use of anticoagulants: Secondary | ICD-10-CM | POA: Insufficient documentation

## 2017-01-13 DIAGNOSIS — C182 Malignant neoplasm of ascending colon: Secondary | ICD-10-CM | POA: Insufficient documentation

## 2017-01-13 DIAGNOSIS — I2510 Atherosclerotic heart disease of native coronary artery without angina pectoris: Secondary | ICD-10-CM | POA: Insufficient documentation

## 2017-01-13 DIAGNOSIS — E785 Hyperlipidemia, unspecified: Secondary | ICD-10-CM | POA: Diagnosis not present

## 2017-01-13 DIAGNOSIS — Z9011 Acquired absence of right breast and nipple: Secondary | ICD-10-CM | POA: Insufficient documentation

## 2017-01-13 DIAGNOSIS — K317 Polyp of stomach and duodenum: Secondary | ICD-10-CM | POA: Diagnosis not present

## 2017-01-13 DIAGNOSIS — K5730 Diverticulosis of large intestine without perforation or abscess without bleeding: Secondary | ICD-10-CM | POA: Diagnosis not present

## 2017-01-13 DIAGNOSIS — K219 Gastro-esophageal reflux disease without esophagitis: Secondary | ICD-10-CM | POA: Insufficient documentation

## 2017-01-13 DIAGNOSIS — D509 Iron deficiency anemia, unspecified: Secondary | ICD-10-CM

## 2017-01-13 DIAGNOSIS — R195 Other fecal abnormalities: Secondary | ICD-10-CM | POA: Insufficient documentation

## 2017-01-13 DIAGNOSIS — E669 Obesity, unspecified: Secondary | ICD-10-CM | POA: Diagnosis not present

## 2017-01-13 DIAGNOSIS — D124 Benign neoplasm of descending colon: Secondary | ICD-10-CM

## 2017-01-13 DIAGNOSIS — F419 Anxiety disorder, unspecified: Secondary | ICD-10-CM | POA: Insufficient documentation

## 2017-01-13 DIAGNOSIS — K5710 Diverticulosis of small intestine without perforation or abscess without bleeding: Secondary | ICD-10-CM | POA: Insufficient documentation

## 2017-01-13 DIAGNOSIS — G4730 Sleep apnea, unspecified: Secondary | ICD-10-CM | POA: Insufficient documentation

## 2017-01-13 DIAGNOSIS — C3490 Malignant neoplasm of unspecified part of unspecified bronchus or lung: Secondary | ICD-10-CM | POA: Insufficient documentation

## 2017-01-13 DIAGNOSIS — D123 Benign neoplasm of transverse colon: Secondary | ICD-10-CM | POA: Diagnosis not present

## 2017-01-13 DIAGNOSIS — D131 Benign neoplasm of stomach: Secondary | ICD-10-CM | POA: Diagnosis not present

## 2017-01-13 DIAGNOSIS — D122 Benign neoplasm of ascending colon: Secondary | ICD-10-CM

## 2017-01-13 DIAGNOSIS — K449 Diaphragmatic hernia without obstruction or gangrene: Secondary | ICD-10-CM | POA: Diagnosis not present

## 2017-01-13 DIAGNOSIS — Z9221 Personal history of antineoplastic chemotherapy: Secondary | ICD-10-CM | POA: Insufficient documentation

## 2017-01-13 DIAGNOSIS — I480 Paroxysmal atrial fibrillation: Secondary | ICD-10-CM | POA: Diagnosis not present

## 2017-01-13 DIAGNOSIS — Z923 Personal history of irradiation: Secondary | ICD-10-CM | POA: Diagnosis not present

## 2017-01-13 DIAGNOSIS — K6389 Other specified diseases of intestine: Secondary | ICD-10-CM

## 2017-01-13 DIAGNOSIS — F329 Major depressive disorder, single episode, unspecified: Secondary | ICD-10-CM | POA: Diagnosis not present

## 2017-01-13 DIAGNOSIS — J302 Other seasonal allergic rhinitis: Secondary | ICD-10-CM | POA: Insufficient documentation

## 2017-01-13 DIAGNOSIS — Z87891 Personal history of nicotine dependence: Secondary | ICD-10-CM | POA: Insufficient documentation

## 2017-01-13 DIAGNOSIS — I10 Essential (primary) hypertension: Secondary | ICD-10-CM | POA: Diagnosis not present

## 2017-01-13 DIAGNOSIS — Z79899 Other long term (current) drug therapy: Secondary | ICD-10-CM | POA: Insufficient documentation

## 2017-01-13 DIAGNOSIS — Z6829 Body mass index (BMI) 29.0-29.9, adult: Secondary | ICD-10-CM | POA: Insufficient documentation

## 2017-01-13 HISTORY — PX: ESOPHAGOGASTRODUODENOSCOPY: SHX5428

## 2017-01-13 HISTORY — PX: COLONOSCOPY: SHX5424

## 2017-01-13 SURGERY — EGD (ESOPHAGOGASTRODUODENOSCOPY)
Anesthesia: Monitor Anesthesia Care

## 2017-01-13 MED ORDER — LACTATED RINGERS IV SOLN
INTRAVENOUS | Status: DC
  Administered 2017-01-13: 11:00:00 via INTRAVENOUS

## 2017-01-13 MED ORDER — SODIUM CHLORIDE 0.9 % IV SOLN: INTRAVENOUS | Status: DC

## 2017-01-13 MED ORDER — PROPOFOL 10 MG/ML IV BOLUS
INTRAVENOUS | Status: DC | PRN
  Administered 2017-01-13 (×6): 10 mg via INTRAVENOUS
  Administered 2017-01-13: 40 mg via INTRAVENOUS
  Administered 2017-01-13 (×3): 10 mg via INTRAVENOUS
  Administered 2017-01-13 (×2): 20 mg via INTRAVENOUS
  Administered 2017-01-13: 10 mg via INTRAVENOUS
  Administered 2017-01-13: 20 mg via INTRAVENOUS
  Administered 2017-01-13 (×11): 10 mg via INTRAVENOUS

## 2017-01-13 MED ORDER — PROPOFOL 10 MG/ML IV BOLUS
INTRAVENOUS | Status: AC
  Filled 2017-01-13: qty 40

## 2017-01-13 MED ORDER — APIXABAN 5 MG PO TABS: 5.0000 mg | ORAL_TABLET | Freq: Two times a day (BID) | ORAL | 2 refills | Status: DC

## 2017-01-13 NOTE — Anesthesia Postprocedure Evaluation (Signed)
Anesthesia Post Note  Patient: Pamela Mcguire  Procedure(s) Performed: Procedure(s) (LRB): ESOPHAGOGASTRODUODENOSCOPY (EGD) (N/A) COLONOSCOPY (N/A)  Patient location during evaluation: Endoscopy Anesthesia Type: MAC Level of consciousness: awake and alert Pain management: pain level controlled Vital Signs Assessment: post-procedure vital signs reviewed and stable Respiratory status: spontaneous breathing, nonlabored ventilation, respiratory function stable and patient connected to nasal cannula oxygen Cardiovascular status: stable and blood pressure returned to baseline Anesthetic complications: no       Last Vitals:  Vitals:   01/13/17 1059 01/13/17 1201  BP: (!) 170/83 (!) 171/78  Pulse: 98 91  Resp: 11 (!) 26  Temp: 36.5 C 36.8 C    Last Pain:  Vitals:   01/13/17 1201  TempSrc: Oral                 Catalina Gravel

## 2017-01-13 NOTE — Anesthesia Preprocedure Evaluation (Signed)
Anesthesia Evaluation  Patient identified by MRN, date of birth, ID band Patient awake    Reviewed: Allergy & Precautions, NPO status , Patient's Chart, lab work & pertinent test results, reviewed documented beta blocker date and time   Airway Mallampati: II  TM Distance: >3 FB Neck ROM: Full    Dental  (+) Teeth Intact, Dental Advisory Given   Pulmonary sleep apnea , former smoker,  Non-small cell lung cancer   Pulmonary exam normal breath sounds clear to auscultation       Cardiovascular hypertension, Pt. on medications and Pt. on home beta blockers + CAD  Normal cardiovascular exam+ dysrhythmias Atrial Fibrillation  Rhythm:Regular Rate:Normal     Neuro/Psych PSYCHIATRIC DISORDERS Anxiety Depression negative neurological ROS     GI/Hepatic Neg liver ROS, GERD  Medicated,  Endo/Other  Obesity   Renal/GU negative Renal ROS     Musculoskeletal  (+) Arthritis ,   Abdominal   Peds  Hematology  (+) Blood dyscrasia (Eliquis therapy), anemia ,   Anesthesia Other Findings Day of surgery medications reviewed with the patient.  Reproductive/Obstetrics                             Anesthesia Physical Anesthesia Plan  ASA: III  Anesthesia Plan: MAC   Post-op Pain Management:    Induction: Intravenous  Airway Management Planned: Nasal Cannula  Additional Equipment:   Intra-op Plan:   Post-operative Plan:   Informed Consent: I have reviewed the patients History and Physical, chart, labs and discussed the procedure including the risks, benefits and alternatives for the proposed anesthesia with the patient or authorized representative who has indicated his/her understanding and acceptance.   Dental advisory given  Plan Discussed with: CRNA and Anesthesiologist  Anesthesia Plan Comments: (Discussed risks/benefits/alternatives to MAC sedation including need for ventilatory support,  hypotension, need for conversion to general anesthesia.  All patient questions answered.  Patient/guardian wishes to proceed.)        Anesthesia Quick Evaluation

## 2017-01-13 NOTE — Op Note (Addendum)
[facility] Patient Name: Pamela Mcguire Procedure Date: 01/13/2017 MRN: [id number] Attending MD: Gatha Mayer , MD Date of Birth: 01-18-1935 CSN: [id number] Age: 82 Admit Type: Outpatient Procedure:                Colonoscopy Indications:              Heme positive stool, Iron deficiency anemia Providers:                Gatha Mayer, MD, Carmie End, RN, Corliss Parish, Technician Referring MD:              Medicines:                Propofol per Anesthesia, Monitored Anesthesia Care Complications:            No immediate complications. Estimated Blood Loss:     Estimated blood loss was minimal. Procedure:                Pre-Anesthesia Assessment:                           - Prior to the procedure, a History and Physical                            was performed, and patient medications and                            allergies were reviewed. The patient's tolerance of                            previous anesthesia was also reviewed. The risks                            and benefits of the procedure and the sedation                            options and risks were discussed with the patient.                            All questions were answered, and informed consent                            was obtained. Prior Anticoagulants: The patient                            last took Eliquis (apixaban) 3 days prior to the                            procedure. ASA Grade Assessment: III - A patient                            with severe systemic disease. After reviewing the  risks and benefits, the patient was deemed in                            satisfactory condition to undergo the procedure.                           - Prior to the procedure, a History and Physical                            was performed, and patient medications and                            allergies were reviewed. The patient's tolerance of                             previous anesthesia was also reviewed. The risks                            and benefits of the procedure and the sedation                            options and risks were discussed with the patient.                            All questions were answered, and informed consent                            was obtained. Prior Anticoagulants: The patient                            last took Eliquis (apixaban) 3 days prior to the                            procedure. ASA Grade Assessment: III - A patient                            with severe systemic disease. After reviewing the                            risks and benefits, the patient was deemed in                            satisfactory condition to undergo the procedure.                           After obtaining informed consent, the colonoscope                            was passed under direct vision. Throughout the                            procedure, the patient's blood pressure, pulse, and  oxygen saturations were monitored continuously. The                            EC-[id number] ([id number]) scope was introduced through                            the anus and advanced to the the cecum, identified                            by appendiceal orifice and ileocecal valve. The                            colonoscopy was performed without difficulty. The                            patient tolerated the procedure well. The quality                            of the bowel preparation was good. The bowel                            preparation used was Miralax. The ileocecal valve,                            appendiceal orifice, and rectum were photographed. Scope In: 11:33:25 AM Scope Out: 11:52:08 AM Scope Withdrawal Time: 0 hours 12 minutes 17 seconds  Total Procedure Duration: 0 hours 18 minutes 43 seconds  Findings:      The perianal and digital rectal examinations were normal.      A polypoid,  sessile and ulcerated non-obstructing medium-sized mass was       found in the proximal ascending colon. The mass was partially       circumferential (involving one-third of the lumen circumference). Oozing       was present. This was biopsied with a cold forceps for histology.       Verification of patient identification for the specimen was done.       Estimated blood loss was minimal.      Four sessile and semi-pedunculated polyps were found in the descending       colon, transverse colon and ascending colon. The polyps were 3 to 7 mm       in size. These polyps were removed with a cold snare. Resection and       retrieval were complete. Verification of patient identification for the       specimen was done. Estimated blood loss was minimal.      Multiple large-mouthed diverticula were found in the entire colon.      The exam was otherwise without abnormality on direct and retroflexion       views. Impression:               - Malignant tumor suspected in the proximal                            ascending colon. Biopsied.                           -  Four 3 to 7 mm polyps in the descending colon, in                            the transverse colon and in the ascending colon,                            removed with a cold snare. Resected and retrieved.                           - Diverticulosis in the entire examined colon.                           - The examination was otherwise normal on direct                            and retroflexion views. Moderate Sedation:      N/A- Per Anesthesia Care Recommendation:           - Patient has a contact number available for                            emergencies. The signs and symptoms of potential                            delayed complications were discussed with the                            patient. Return to normal activities tomorrow.                            Written discharge instructions were provided to the                             patient.                           - Resume previous diet.                           - Continue present medications.                           - Await pathology results.                           - STAY OFF ELIQUIS FOR NOW - BLEEDING FROM                            ASCEDNING COLON MASS SUSPICIOUS FOR CANCER MUST                            HAVE CAUSED ANEMIA.                           THIS LIT UP ON RECENT PET SCAN.  MANAGEMENT OPTIONS COULD BE LIMITED BY HEALTH AND                            STAGE 3 LUNG CANCER BUT BLEEDING RISK IS MAIN                            PROBLEM SO WILL NEED TO CONSIDER SURGERY                           - No repeat colonoscopy. Procedure Code(s):        --- Professional ---                           [phone], Colonoscopy, flexible; with removal of                            tumor(s), polyp(s), or other lesion(s) by snare                            technique                           45380, 34, Colonoscopy, flexible; with biopsy,                            single or multiple Diagnosis Code(s):        --- Professional ---                           C18.2, Malignant neoplasm of ascending colon                           D12.4, Benign neoplasm of descending colon                           D12.3, Benign neoplasm of transverse colon (hepatic                            flexure or splenic flexure)                           D12.2, Benign neoplasm of ascending colon                           R19.5, Other fecal abnormalities                           D50.9, Iron deficiency anemia, unspecified                           K57.30, Diverticulosis of large intestine without                            perforation or abscess without bleeding CPT copyright 2016 [employer]. All rights reserved. The codes documented in this report are preliminary and upon coder review may  be revised to meet  current compliance requirements. Gatha Mayer,  MD 01/13/2017 12:13:44 PM This report has been signed electronically. Number of Addenda: 0

## 2017-01-13 NOTE — Discharge Instructions (Signed)
° °  The main finding today is a mass in the colon that unfortunately looks like a cancer. I took biopsies and will let you know soon. I think its best that you remain off the Eliquis at this time to reduce the bleeding.  I will let Dr. Julien Nordmann know.  I appreciate the opportunity to care for you. Gatha Mayer, MD, FACG  YOU HAD AN ENDOSCOPIC PROCEDURE TODAY: Refer to the procedure report and other information in the discharge instructions given to you for any specific questions about what was found during the examination. If this information does not answer your questions, please call Dr. Celesta Aver office at [phone] to clarify.   YOU SHOULD EXPECT: Some feelings of bloating in the abdomen. Passage of more gas than usual. Walking can help get rid of the air that was put into your GI tract during the procedure and reduce the bloating. If you had a lower endoscopy (such as a colonoscopy or flexible sigmoidoscopy) you may notice spotting of blood in your stool or on the toilet paper. Some abdominal soreness may be present for a day or two, also.  DIET: Your first meal following the procedure should be a light meal and then it is ok to progress to your normal diet. A half-sandwich or bowl of soup is an example of a good first meal. Heavy or fried foods are harder to digest and may make you feel nauseous or bloated. Drink plenty of fluids but you should avoid alcoholic beverages for 24 hours.   ACTIVITY: Your care partner should take you home directly after the procedure. You should plan to take it easy, moving slowly for the rest of the day. You can resume normal activity the day after the procedure however YOU SHOULD NOT DRIVE, use power tools, machinery or perform tasks that involve climbing or major physical exertion for 24 hours (because of the sedation medicines used during the test).   SYMPTOMS TO REPORT IMMEDIATELY: A gastroenterologist can be reached at any hour. Please call  [phone]  for any of the following symptoms:  Following lower endoscopy (colonoscopy, flexible sigmoidoscopy) Excessive amounts of blood in the stool  Significant tenderness, worsening of abdominal pains  Swelling of the abdomen that is new, acute  Fever of 100 or higher  Following upper endoscopy (EGD, EUS, ERCP, esophageal dilation) Vomiting of blood or coffee ground material  New, significant abdominal pain  New, significant chest pain or pain under the shoulder blades  Painful or persistently difficult swallowing  New shortness of breath  Black, tarry-looking or red, bloody stools  FOLLOW UP:  If any biopsies were taken you will be contacted by phone or by letter within the next 1-3 weeks. Call [phone]  if you have not heard about the biopsies in 3 weeks.  Please also call with any specific questions about appointments or follow up tests.

## 2017-01-13 NOTE — Op Note (Signed)
[facility] Patient Name: Pamela Mcguire Procedure Date: 01/13/2017 MRN: [id number] Attending MD: Gatha Mayer , MD Date of Birth: February 14, 1935 CSN: [id number] Age: 82 Admit Type: Outpatient Procedure:                Upper GI endoscopy Indications:              Iron deficiency anemia, Heme positive stool Providers:                Gatha Mayer, MD, Carmie End, RN, Corliss Parish, Technician Referring MD:              Medicines:                Propofol per Anesthesia, Monitored Anesthesia Care Complications:            No immediate complications. Estimated Blood Loss:     Estimated blood loss: none. Procedure:                Pre-Anesthesia Assessment:                           - Prior to the procedure, a History and Physical                            was performed, and patient medications and                            allergies were reviewed. The patient's tolerance of                            previous anesthesia was also reviewed. The risks                            and benefits of the procedure and the sedation                            options and risks were discussed with the patient.                            All questions were answered, and informed consent                            was obtained. Prior Anticoagulants: The patient                            last took Eliquis (apixaban) 3 days prior to the                            procedure. ASA Grade Assessment: III - A patient                            with severe systemic disease. After reviewing the  risks and benefits, the patient was deemed in                            satisfactory condition to undergo the procedure.                           After obtaining informed consent, the endoscope was                            passed under direct vision. Throughout the                            procedure, the patient's blood pressure, pulse, and                             oxygen saturations were monitored continuously. The                            EG-[id number] ([id number]) scope was introduced through the                            mouth, and advanced to the second part of duodenum.                            The upper GI endoscopy was accomplished without                            difficulty. The patient tolerated the procedure                            well. Scope In: Scope Out: Findings:      Multiple small semi-sessile polyps with no stigmata of recent bleeding       were found in the gastric fundus and in the gastric body.      A small hiatal hernia was present.      A diverticulum was found in the second portion of the duodenum.      The exam was otherwise without abnormality.      The cardia and gastric fundus were otherwise normal on retroflexion. Impression:               - Multiple gastric polyps. Look like small fundic                            gland polyps                           - Small hiatal hernia.                           - The examination was otherwise normal.                           - No specimens collected. Moderate Sedation:      N/A- Per Anesthesia Care Recommendation:           - Patient has a  contact number available for                            emergencies. The signs and symptoms of potential                            delayed complications were discussed with the                            patient. Return to normal activities tomorrow.                            Written discharge instructions were provided to the                            patient.                           - Resume previous diet.                           - Continue present medications.                           - See the other procedure note for documentation of                            additional recommendations. Procedure Code(s):        --- Professional ---                           [phone], Esophagogastroduodenoscopy,  flexible,                            transoral; diagnostic, including collection of                            specimen(s) by brushing or washing, when performed                            (separate procedure) Diagnosis Code(s):        --- Professional ---                           K31.7, Polyp of stomach and duodenum                           K44.9, Diaphragmatic hernia without obstruction or                            gangrene                           D50.9, Iron deficiency anemia, unspecified                           R19.5, Other fecal abnormalities CPT copyright 2016 [employer] Association. All rights  reserved. The codes documented in this report are preliminary and upon coder review may  be revised to meet current compliance requirements. Gatha Mayer, MD 01/13/2017 12:06:33 PM This report has been signed electronically. Number of Addenda: 0

## 2017-01-13 NOTE — Transfer of Care (Signed)
Immediate Anesthesia Transfer of Care Note  Patient: Pamela Mcguire  Procedure(s) Performed: Procedure(s): ESOPHAGOGASTRODUODENOSCOPY (EGD) (N/A) COLONOSCOPY (N/A)  Patient Location: PACU  Anesthesia Type:MAC  Level of Consciousness: sedated  Airway & Oxygen Therapy: Patient Spontanous Breathing and Patient connected to nasal cannula oxygen  Post-op Assessment: Report given to RN and Post -op Vital signs reviewed and stable  Post vital signs: Reviewed and stable  Last Vitals:  Vitals:   01/13/17 1059  BP: (!) 170/83  Pulse: 98  Resp: 11  Temp: 36.5 C    Last Pain:  Vitals:   01/13/17 1059  TempSrc: Oral         Complications: No apparent anesthesia complications

## 2017-01-13 NOTE — Interval H&P Note (Signed)
History and Physical Interval Note:  01/13/2017 11:00 AM  Pamela Mcguire  has presented today for surgery, with the diagnosis of iron def anemia; heme positive  The various methods of treatment have been discussed with the patient and family. After consideration of risks, benefits and other options for treatment, the patient has consented to  Procedure(s): ESOPHAGOGASTRODUODENOSCOPY (EGD) (N/A) COLONOSCOPY (N/A) as a surgical intervention .  The patient's history has been reviewed, patient examined, no change in status, stable for surgery.  I have reviewed the patient's chart and labs.  Questions were answered to the patient's satisfaction.     [name]

## 2017-01-13 NOTE — H&P (View-Only) (Signed)
12/22/2016 Pamela Mcguire [id number] 1935/08/28   HISTORY OF PRESENT ILLNESS:  This is a 82 year old female who is new to our practice. She has been referred here by her oncologist, Dr. Earlie Server, for evaluation of IDA and heme positive stools. She has a history of breast cancer and also currently has Stage IIIA non-small cell lung cancer.  She underwent chemoradiation therapy with her last chemotherapy given in July 2016. Currently on observation. She has never seen a GI doctor for colonoscopy in the past.  Two weeks ago she was found have a hemoglobin of 7.0 g. Seven months ago her hemoglobin was normal at 11.6 g. The patient denies any black or bloody stools. She was Hemoccult positive on 3 separate occasions recently. Iron studies are low as well with a ferritin of 15, serum iron 21, and iron saturation of 5%.  She was transfused with 2 units of packed red blood cells after this last hemoglobin.  Denies any GI complaints.    She is on Eliquis for PAF, which is prescribed by Dr. Debara Pickett.  Has O2 at home but does not use it often.  Past Medical History:  Diagnosis Date  . AF (atrial fibrillation) ([place])   . Anxiety   . Arthritis    "thighs, hips, arms" (04/28/2015)  . Breast cancer ([place])    right mastectomy  . CAD (coronary artery disease)   . Dehydration 12/06/2016  . Depression   . Diverticulitis   . GERD (gastroesophageal reflux disease)   . Hyperlipidemia   . Hypertension   . Non-small cell carcinoma of lung, stage 3 ([place]) 04/30/2015  . Prediabetes   . Seasonal allergies   . Shortness of breath dyspnea   . Sleep apnea     dx'd 10/2014, has not received cpap yet (04/28/2015)  . Urinary frequency    Past Surgical History:  Procedure Laterality Date  . BREAST SURGERY    . CARDIOVERSION    . CESAREAN SECTION  X 2  . COLONOSCOPY    . MASTECTOMY Right   . MEDIASTINOSCOPY N/A 04/28/2015   Procedure: MEDIASTINOSCOPY;  Surgeon: [name], MD;  Location: [facility] OR;  Service: Thoracic;   Laterality: N/A;  . TONSILLECTOMY      reports that she quit smoking about 24 years ago. Her smoking use included Cigarettes. She has a 35.00 pack-year smoking history. She has never used smokeless tobacco. She reports that she drinks about 5.4 oz of alcohol per week . She reports that she does not use drugs. family history includes Mental illness in her mother. No Known Allergies    Outpatient Encounter Prescriptions as of 12/22/2016  Medication Sig  . acetaminophen (TYLENOL) 500 MG tablet Take 1,000 mg by mouth every 8 (eight) hours as needed. Reported on 06/01/2016  . albuterol (PROVENTIL HFA;VENTOLIN HFA) 108 (90 Base) MCG/ACT inhaler Inhale 1 puff into the lungs every 6 (six) hours as needed for wheezing or shortness of breath. Reported on 06/01/2016  . atorvastatin (LIPITOR) 40 MG tablet Take 1 tablet (40 mg total) by mouth daily.  . betamethasone dipropionate (DIPROLENE) 0.05 % cream   . budesonide-formoterol (SYMBICORT) 160-4.5 MCG/ACT inhaler Inhale 2 puffs into the lungs 2 (two) times daily as needed (For wheezing, wait 1 min between each puff).  [place] Kitchen buPROPion (WELLBUTRIN SR) 100 MG 12 hr tablet TAKE 1 TABLET(100 MG) BY MOUTH DAILY  . carvedilol (COREG) 3.125 MG tablet TAKE 1 TABLET(3.125 MG) BY MOUTH TWICE DAILY  . Cranberry-Vitamin C-Probiotic (AZO CRANBERRY  PO) Take by mouth.  . diphenhydramine-acetaminophen (TYLENOL PM) 25-500 MG TABS tablet Take 1 tablet by mouth at bedtime as needed. Reported on 06/01/2016  . ELIQUIS 5 MG TABS tablet TAKE 1 TABLET BY MOUTH TWICE DAILY  . escitalopram (LEXAPRO) 10 MG tablet TAKE 1 TABLET(10 MG) BY MOUTH DAILY  . feeding supplement (BOOST / RESOURCE BREEZE) LIQD Take 1 Container by mouth 3 (three) times daily between meals.  . FeFum-FePoly-FA-B Cmp-C-Biot (INTEGRA PLUS) CAPS One cap po qd  . fluticasone furoate-vilanterol (BREO ELLIPTA) 100-25 MCG/INH AEPB Inhale 1 puff into the lungs daily.  . folic acid (FOLVITE) 1 MG tablet TAKE 1 TABLET BY MOUTH  TWICE DAILY  . loratadine (CLARITIN) 10 MG tablet TAKE 1 TABLET BY MOUTH AT BEDTIME  . LORazepam (ATIVAN) 1 MG tablet Take 1 tablet (1 mg total) by mouth every 8 (eight) hours as needed. for anxiety  . Melatonin 10 MG CAPS Take by mouth.  . pantoprazole (PROTONIX) 40 MG tablet TAKE 1 TABLET BY MOUTH EVERY DAY  . UNABLE TO FIND Med Name: Wig  . [DISCONTINUED] triamcinolone cream (KENALOG) 0.1 %    No facility-administered encounter medications on file as of 12/22/2016.      REVIEW OF SYSTEMS  : All other systems reviewed and negative except where noted in the History of Present Illness.   PHYSICAL EXAM: BP (!) 100/52   Pulse 84   Ht '5\' 5"'$  (1.651 m)   Wt 189 lb (85.7 kg)   BMI 31.45 kg/m  General: Well developed white female in no acute distress; in wheelchair Head: Normocephalic and atraumatic Eyes:  Sclerae anicteric ,conjunctiva pink. Ears: Normal auditory acuity Lungs: Clear throughout to auscultation; no increased WOB. Heart: Regular rate and rhythm Abdomen: Soft, non-distended. Normal bowel sounds.  Non-tender. Rectal:  Will be done at the time of colonoscopy. Musculoskeletal: Symmetrical with no gross deformities  Skin: No lesions on visible extremities Extremities: No edema  Neurological: Alert oriented x 4, grossly non-focal Psychological:  Alert and cooperative. Normal mood and affect  ASSESSMENT AND PLAN: -IDA and heme positive stools:  No overt bleeding.  Hgb down 4 grams over the past 7 months.  No overt bleeding.  Iron studies low.  Never had GI evaluation with EGD or colonoscopy.  Will schedule EGD and colonoscopy with Dr. Carlean Purl at the hospital due to her intermittent O2 use.  Will recheck CBC today. -Chronic anticoagulation on Eliquis for PAF:  Will hold Eliquis for 2 days prior to endoscopic procedures - will instruct when and how to resume after procedure. Benefits and risks of procedure explained including risks of bleeding, perforation, infection, missed  lesions, reactions to medications and possible need for hospitalization and surgery for complications. Additional rare but real risk of stroke or other vascular clotting events off Eliquis also explained and need to seek urgent help if any signs of these problems occur. Will communicate by phone or EMR with patient's prescribing provider, Dr. Debara Pickett, to confirm that holding Eliquis is reasonable in this case.    CC:  Curt Bears, MD  Agree with Ms. Vasco Chong's management.  Gatha Mayer, MD, [facility]

## 2017-01-16 ENCOUNTER — Encounter (HOSPITAL_COMMUNITY): Payer: Self-pay | Admitting: Internal Medicine

## 2017-01-17 ENCOUNTER — Telehealth: Payer: Self-pay | Admitting: *Deleted

## 2017-01-17 ENCOUNTER — Telehealth: Payer: Self-pay | Admitting: Medical Oncology

## 2017-01-17 NOTE — Telephone Encounter (Signed)
Tommi Emery shared results of colonoscopy and wants to know next steps for low hgb/hct. Note to [place].

## 2017-01-17 NOTE — Telephone Encounter (Signed)
She needs referral to general surgery.

## 2017-01-17 NOTE — Telephone Encounter (Signed)
Daughter called and left msg on triage requesting refill on moms Lorazepam.../lmb

## 2017-01-18 MED ORDER — LORAZEPAM 1 MG PO TABS: 1.0000 mg | ORAL_TABLET | Freq: Every day | ORAL | 1 refills | Status: DC

## 2017-01-18 NOTE — Telephone Encounter (Signed)
Please refer to Dr. Marcello Moores (or if she has a TEFL teacher that one) re: colon cancer ascending colon

## 2017-01-18 NOTE — Telephone Encounter (Addendum)
Pamela Mcguire updated on plan that Dr Carlean Purl is referring pt to Dr A. Thomas or other.[name] wants to hold off on bringing mother in for lab appt tomorrow. I offered lab appt today and she will call back if sister can bring her in.

## 2017-01-18 NOTE — Telephone Encounter (Signed)
Faxed script to [facility].../lmb 

## 2017-01-18 NOTE — Telephone Encounter (Signed)
I spoke with the patient's daughter and she verbalized understanding that the referral has been sent to [facility] and that she should get a call from them in the next few days.

## 2017-01-18 NOTE — Progress Notes (Signed)
Patient aware of colon cancer dx Surgery consult initiated No recall at this time

## 2017-01-18 NOTE — Telephone Encounter (Signed)
Medication refilled

## 2017-01-19 ENCOUNTER — Telehealth: Payer: Self-pay | Admitting: Emergency Medicine

## 2017-01-19 NOTE — Telephone Encounter (Signed)
Appointment with [facility] on 01/30/17 2:10 pm with Dr. Marcello Moores

## 2017-01-26 ENCOUNTER — Other Ambulatory Visit: Payer: Self-pay | Admitting: Family

## 2017-01-26 DIAGNOSIS — F32A Depression, unspecified: Secondary | ICD-10-CM

## 2017-01-26 DIAGNOSIS — F329 Major depressive disorder, single episode, unspecified: Secondary | ICD-10-CM

## 2017-01-30 ENCOUNTER — Other Ambulatory Visit: Payer: Self-pay | Admitting: General Surgery

## 2017-02-01 ENCOUNTER — Telehealth: Payer: Self-pay | Admitting: Internal Medicine

## 2017-02-01 NOTE — Telephone Encounter (Signed)
[facility] requests clearance for: 1. Type of surgery: laparoscopic colectomy under general anesthesia 2. Date of surgery: "near future" 3. Surgeon: Leighton Ruff, MD 4. Medications that need to be held & how long: per clearance request note "she is currently holding Eliquis due to bleeding" - please provide instructions if she can continue to hold medication or how she should HOLD medication preoperatively 5. Fax and/or Phone: (p) [phone]  (f) [phone]  -- attn: Mammie Lorenzo, LPN

## 2017-02-02 NOTE — Telephone Encounter (Signed)
Clearance sent to MD/LPN via EPIC

## 2017-02-02 NOTE — Telephone Encounter (Signed)
Continue to hold Eliquis and restart after surgery when feasible from a bleeding standpoint. She is low risk for surgery.  Dr. Lemmie Evens

## 2017-02-06 ENCOUNTER — Other Ambulatory Visit: Payer: Self-pay | Admitting: General Surgery

## 2017-02-06 NOTE — Patient Instructions (Addendum)
Pamela Mcguire  02/06/2017   Your procedure is scheduled on: 02/08/17  Report to [facility] Main  Entrance take [facility]  elevators to 3rd floor to  [place] at      [phone].  Call this number if you have problems the morning of surgery [phone]   Remember: ONLY 1 PERSON MAY GO WITH YOU TO SHORT STAY TO GET  READY MORNING OF [place].  Do not eat food or drink liquids :After Midnight.     Take these medicines the morning of surgery with A SIP OF WATER: albuterol if needed, bupropion, coreg, lexapro, protonix                               You may not have any metal on your body including hair pins and              piercings  Do not wear jewelry, make-up, lotions, powders or perfumes, deodorant             Do not wear nail polish.  Do not shave  48 hours prior to surgery.                Do not bring valuables to the hospital. [place].  Contacts, dentures or bridgework may not be worn into surgery.  Leave suitcase in the car. After surgery it may be brought to your room.     Patients discharged the day of surgery will not be allowed to drive home.  Name and phone number of your driver:  Special Instructions: N/A              Please read over the following fact sheets you were given: _____________________________________________________________________                CLEAR LIQUID DIET   Foods Allowed                                                                     Foods Excluded  Coffee and tea, regular and decaf                             liquids that you cannot  Plain Jell-O in any flavor                                             see through such as: Fruit ices (not with fruit pulp)                                     milk, soups, orange juice  Iced Popsicles  All solid food Carbonated beverages, regular and diet                                     Cranberry, grape and apple juices Sports drinks like Gatorade Lightly seasoned clear broth or consume(fat free) Sugar, honey syrup  Sample Menu Breakfast                                Lunch                                     Supper Cranberry juice                    Beef broth                            Chicken broth Jell-O                                     Grape juice                           Apple juice Coffee or tea                        Jell-O                                      Popsicle                                                Coffee or tea                        Coffee or tea  _____________________________________________________________________  [facility] - Preparing for Surgery Before surgery, you can play an important role.  Because skin is not sterile, your skin needs to be as free of germs as possible.  You can reduce the number of germs on your skin by washing with CHG (chlorahexidine gluconate) soap before surgery.  CHG is an antiseptic cleaner which kills germs and bonds with the skin to continue killing germs even after washing. Please DO NOT use if you have an allergy to CHG or antibacterial soaps.  If your skin becomes reddened/irritated stop using the CHG and inform your nurse when you arrive at Short Stay. Do not shave (including legs and underarms) for at least 48 hours prior to the first CHG shower.  You may shave your face/neck. Please follow these instructions carefully:  1.  Shower with CHG Soap the night before surgery and the  morning of Surgery.  2.  If you choose to wash your hair, wash your hair first as usual with your  normal  shampoo.  3.  After you shampoo, rinse your hair and body thoroughly to remove the  shampoo.  4.  Use CHG as you would any other liquid soap.  You can apply chg directly  to the skin and wash                       Gently with a scrungie or clean washcloth.  5.  Apply the CHG Soap to your body ONLY  FROM THE NECK DOWN.   Do not use on face/ open                           Wound or open sores. Avoid contact with eyes, ears mouth and genitals (private parts).                       Wash face,  Genitals (private parts) with your normal soap.             6.  Wash thoroughly, paying special attention to the area where your surgery  will be performed.  7.  Thoroughly rinse your body with warm water from the neck down.  8.  DO NOT shower/wash with your normal soap after using and rinsing off  the CHG Soap.                9.  Pat yourself dry with a clean towel.            10.  Wear clean pajamas.            11.  Place clean sheets on your bed the night of your first shower and do not  sleep with pets. Day of Surgery : Do not apply any lotions/deodorants the morning of surgery.  Please wear clean clothes to the hospital/surgery center.  FAILURE TO FOLLOW THESE INSTRUCTIONS MAY RESULT IN THE CANCELLATION OF YOUR SURGERY PATIENT SIGNATURE_________________________________  NURSE SIGNATURE__________________________________  ________________________________________________________________________  WHAT IS A BLOOD TRANSFUSION? Blood Transfusion Information  A transfusion is the replacement of blood or some of its parts. Blood is made up of multiple cells which provide different functions.  Red blood cells carry oxygen and are used for blood loss replacement.  White blood cells fight against infection.  Platelets control bleeding.  Plasma helps clot blood.  Other blood products are available for specialized needs, such as hemophilia or other clotting disorders. BEFORE THE TRANSFUSION  Who gives blood for transfusions?   Healthy volunteers who are fully evaluated to make sure their blood is safe. This is blood bank blood. Transfusion therapy is the safest it has ever been in the practice of medicine. Before blood is taken from a donor, a complete history is taken to make sure that person has  no history of diseases nor engages in risky social behavior (examples are intravenous drug use or sexual activity with multiple partners). The donor's travel history is screened to minimize risk of transmitting infections, such as malaria. The donated blood is tested for signs of infectious diseases, such as HIV and hepatitis. The blood is then tested to be sure it is compatible with you in order to minimize the chance of a transfusion reaction. If you or a relative donates blood, this is often done in anticipation of surgery and is not appropriate for emergency situations. It takes many days to process the donated blood. RISKS AND COMPLICATIONS Although transfusion therapy is very safe and saves many lives, the main dangers of transfusion include:   Getting an infectious disease.  Developing a transfusion reaction.  This is an allergic reaction to something in the blood you were given. Every precaution is taken to prevent this. The decision to have a blood transfusion has been considered carefully by your caregiver before blood is given. Blood is not given unless the benefits outweigh the risks. AFTER THE TRANSFUSION  Right after receiving a blood transfusion, you will usually feel much better and more energetic. This is especially true if your red blood cells have gotten low (anemic). The transfusion raises the level of the red blood cells which carry oxygen, and this usually causes an energy increase.  The nurse administering the transfusion will monitor you carefully for complications. HOME CARE INSTRUCTIONS  No special instructions are needed after a transfusion. You may find your energy is better. Speak with your caregiver about any limitations on activity for underlying diseases you may have. SEEK MEDICAL CARE IF:   Your condition is not improving after your transfusion.  You develop redness or irritation at the intravenous (IV) site. SEEK IMMEDIATE MEDICAL CARE IF:  Any of the following  symptoms occur over the next 12 hours:  Shaking chills.  You have a temperature by mouth above 102 F (38.9 C), not controlled by medicine.  Chest, back, or muscle pain.  People around you feel you are not acting correctly or are confused.  Shortness of breath or difficulty breathing.  Dizziness and fainting.  You get a rash or develop hives.  You have a decrease in urine output.  Your urine turns a dark color or changes to pink, red, or brown. Any of the following symptoms occur over the next 10 days:  You have a temperature by mouth above 102 F (38.9 C), not controlled by medicine.  Shortness of breath.  Weakness after normal activity.  The white part of the eye turns yellow (jaundice).  You have a decrease in the amount of urine or are urinating less often.  Your urine turns a dark color or changes to pink, red, or brown. Document Released: 11/11/2000 Document Revised: 02/06/2012 Document Reviewed: 06/30/2008 [facility] Patient Information 2014 [place], [place].  _______________________________________________________________________

## 2017-02-06 NOTE — H&P (Signed)
The patient is a 82 year old female who presents with colorectal cancer. 82 year old female who is followed for lung cancer underwent a PET scan for evaluation of a lung lesion. This showed a mass in the cecal region, which was active on PET scan. Colonoscopy revealed a mass in the cecum. Biopsies confirmed invasive adenocarcinoma. Patient does struggle with anemia and has had transfusions recently. She reports dark, loose stools. She has atrial fibrillation and was taking Elliquis. This has been stopped due to her bleeding.   Past Surgical History Malachy Moan, [place]; 01/30/2017 2:11 PM) Breast Biopsy  Right. Breast Mass; Local Excision  Right. Cesarean Section - 1  Colon Polyp Removal - Colonoscopy  Lung Surgery  Right.  Diagnostic Studies History Malachy Moan, [place]; 01/30/2017 2:11 PM) Colonoscopy  within last year Pap Smear  1-5 years ago  Allergies Malachy Moan, RMA; 01/30/2017 2:11 PM) No Known Allergies 01/30/2017  Medication History Malachy Moan, RMA; 01/30/2017 2:12 PM) OxyCODONE HCl ('5MG'$  Tablet, Oral) Active. LORazepam ('1MG'$  Tablet, Oral) Active. Atorvastatin Calcium ('40MG'$  Tablet, Oral) Active. Betamethasone Dipropionate (0.05% Cream, External) Active. BuPROPion HCl ER (SR) ('100MG'$  Tablet ER 12HR, Oral) Active. Carvedilol (3.'125MG'$  Tablet, Oral) Active. Ciprofloxacin HCl ('250MG'$  Tablet, Oral) Active. Escitalopram Oxalate ('10MG'$  Tablet, Oral) Active. Loratadine ('10MG'$  Tablet, Oral) Active. Pantoprazole Sodium ('40MG'$  Tablet DR, Oral) Active. Medications Reconciled  Social History Malachy Moan, [place]; 01/30/2017 2:11 PM) Alcohol use  Moderate alcohol use. Caffeine use  Coffee. Tobacco use  Former smoker.  Family History Malachy Moan, [place]; 01/30/2017 2:11 PM) Heart Disease  Father. Hypertension  Son. Migraine Headache  Daughter.  Pregnancy / Birth History Malachy Moan, [place]; 01/30/2017 2:11 PM) Age at menarche  32 years. Age  of menopause  68-55 Gravida  59 Maternal age  34-20 Para  76  Other Problems Malachy Moan, [place]; 01/30/2017 2:11 PM) Alcohol Abuse  Arthritis  Atrial Fibrillation  Back Pain  Breast Cancer  Diverticulosis  Gastroesophageal Reflux Disease  High blood pressure  Lump In Breast  Lung Cancer  Sleep Apnea     Review of Systems Malachy Moan RMA; 01/30/2017 2:11 PM) General Present- Weight Gain. Not Present- Appetite Loss, Chills, Fatigue, Fever, Night Sweats and Weight Loss. Skin Not Present- Change in Wart/Mole, Dryness, Hives, Jaundice, New Lesions, Non-Healing Wounds, Rash and Ulcer. HEENT Present- Hearing Loss and Wears glasses/contact lenses. Not Present- Earache, Hoarseness, Nose Bleed, Oral Ulcers, Ringing in the Ears, Seasonal Allergies, Sinus Pain, Sore Throat, Visual Disturbances and Yellow Eyes. Respiratory Present- Difficulty Breathing. Not Present- Bloody sputum, Chronic Cough, Snoring and Wheezing. Cardiovascular Present- Difficulty Breathing Lying Down and Shortness of Breath. Not Present- Chest Pain, Leg Cramps, Palpitations, Rapid Heart Rate and Swelling of Extremities. Gastrointestinal Present- Chronic diarrhea. Not Present- Abdominal Pain, Bloating, Bloody Stool, Change in Bowel Habits, Constipation, Difficulty Swallowing, Excessive gas, Gets full quickly at meals, Hemorrhoids, Indigestion, Nausea, Rectal Pain and Vomiting. Musculoskeletal Present- Back Pain and Joint Pain. Not Present- Joint Stiffness, Muscle Pain, Muscle Weakness and Swelling of Extremities. Neurological Present- Decreased Memory, Trouble walking and Weakness. Not Present- Fainting, Headaches, Numbness, Seizures, Tingling and Tremor. Psychiatric Present- Anxiety and Depression. Not Present- Bipolar, Change in Sleep Pattern, Fearful and Frequent crying. Hematology Present- Blood Thinners. Not Present- Easy Bruising, Excessive bleeding, Gland problems, HIV and Persistent  Infections.  Vitals Malachy Moan RMA; 01/30/2017 2:13 PM) 01/30/2017 2:12 PM Weight: 188.4 lb Height: 66.5in Body Surface Area: 1.96 m Body Mass Index: 29.95 kg/m  Temp.: 98.60F  Pulse: 93 (Regular)  BP: 136/70 (Sitting,  Left Arm, Standard)       Physical Exam Leighton Ruff MD; 03/06/2009 5:08 PM) General Mental Status-Alert. General Appearance-Not in acute distress. Build & Nutrition-Well nourished. Posture-Normal posture. Gait-Normal.  Head and Neck Head-normocephalic, atraumatic with no lesions or palpable masses. Trachea-midline.  Chest and Lung Exam Chest and lung exam reveals -on auscultation, normal breath sounds, no adventitious sounds and normal vocal resonance.  Cardiovascular Cardiovascular examination reveals -normal heart sounds, regular rate and rhythm with no murmurs and no digital clubbing, cyanosis, edema, increased warmth or tenderness.  Abdomen Inspection Inspection of the abdomen reveals - No Hernias. Palpation/Percussion Palpation and Percussion of the abdomen reveal - Soft, Non Tender, No Rigidity (guarding), No hepatosplenomegaly and No Palpable abdominal masses.  Neurologic Neurologic evaluation reveals -alert and oriented x 3 with no impairment of recent or remote memory, normal attention span and ability to concentrate, normal sensation and normal coordination.  Musculoskeletal Normal Exam - Bilateral-Upper Extremity Strength Normal and Lower Extremity Strength Normal.    Assessment & Plan Leighton Ruff MD; 0/05/1218 2:51 PM) COLON CANCER, ASCENDING (C18.2) Impression: 82 year old female who presents to the office for evaluation of a newly diagnosed ascending colon cancer. This was found after the patient was noted to be anemic and PET scan showed a lesion in the right colon. A colonoscopy was performed and this showed a mass in the descending colon which was biopsied. Biopsy returned as adenocarcinoma.  Patient has lung cancer and does have lesions noted in her long as well on PET scan. We will follow-up with her cardiologist Dr. Debara Pickett to evaluate her perioperative surgical risk. If this seems acceptable, we will plan on doing a laparoscopic right colectomy mainly to control her bleeding. This has been discussed with the patient and her daughters in detail. All questions were answered. The surgery and anatomy were described to the patient as well as the risks of surgery and the possible complications. These include: Bleeding, deep abdominal infections and possible wound complications such as hernia and infection, damage to adjacent structures, leak of surgical connections, which can lead to other surgeries and possibly an ostomy, possible need for other procedures, such as abscess drains in radiology, possible prolonged hospital stay, possible diarrhea from removal of part of the colon, possible constipation from narcotics, possible bowel, bladder or sexual dysfunction if having rectal surgery, prolonged fatigue/weakness or appetite loss, possible early recurrence of of disease, possible complications of their medical problems such as heart disease or arrhythmias or lung problems, death (less than 1%). I believe the patient understands and wishes to proceed with the surgery. Current Plans

## 2017-02-07 ENCOUNTER — Encounter (HOSPITAL_COMMUNITY): Payer: Self-pay

## 2017-02-07 ENCOUNTER — Encounter (HOSPITAL_COMMUNITY)
Admission: RE | Admit: 2017-02-07 | Discharge: 2017-02-07 | Disposition: A | Discharge status: Home / Self Care | Payer: Medicare HMO | Source: Ambulatory Visit | Attending: General Surgery | Admitting: General Surgery

## 2017-02-07 DIAGNOSIS — C182 Malignant neoplasm of ascending colon: Secondary | ICD-10-CM | POA: Insufficient documentation

## 2017-02-07 DIAGNOSIS — Z01812 Encounter for preprocedural laboratory examination: Secondary | ICD-10-CM

## 2017-02-07 HISTORY — DX: Unspecified dementia, unspecified severity, without behavioral disturbance, psychotic disturbance, mood disturbance, and anxiety: F03.90

## 2017-02-07 LAB — BASIC METABOLIC PANEL
Anion gap: 6 (ref 5–15)
BUN: 18 mg/dL (ref 6–20)
CHLORIDE: 105 mmol/L (ref 101–111)
CO2: 27 mmol/L (ref 22–32)
CREATININE: 0.97 mg/dL (ref 0.44–1.00)
Calcium: 9.6 mg/dL (ref 8.9–10.3)
GFR calc non Af Amer: 54 mL/min — ABNORMAL LOW (ref >60)
Glucose, Bld: 107 mg/dL — ABNORMAL HIGH (ref 65–99)
Potassium: 4.6 mmol/L (ref 3.5–5.1)
SODIUM: 138 mmol/L (ref 135–145)

## 2017-02-07 LAB — CBC
HEMATOCRIT: 31.9 % — AB (ref 36.0–46.0)
Hemoglobin: 9.5 g/dL — ABNORMAL LOW (ref 12.0–15.0)
MCH: 22.7 pg — AB (ref 26.0–34.0)
MCHC: 29.8 g/dL — ABNORMAL LOW (ref 30.0–36.0)
MCV: 76.3 fL — AB (ref 78.0–100.0)
Platelets: 440 10*3/uL — ABNORMAL HIGH (ref 150–400)
RBC: 4.18 MIL/uL (ref 3.87–5.11)
RDW: 18.3 % — ABNORMAL HIGH (ref 11.5–15.5)
WBC: 9.6 10*3/uL (ref 4.0–10.5)

## 2017-02-07 NOTE — Progress Notes (Signed)
CBC done 02/07/17 routed to Dr. Marcello Moores via epic

## 2017-02-07 NOTE — Progress Notes (Signed)
02/28/16 ekg epic  02/01/17 medical clearance card telephone note epic CT chest 11/29/16 epic

## 2017-02-08 ENCOUNTER — Encounter (HOSPITAL_COMMUNITY): Payer: Self-pay | Admitting: Orthopedic Surgery

## 2017-02-08 ENCOUNTER — Encounter (HOSPITAL_COMMUNITY)
Admission: RE | Disposition: A | Discharge status: Skilled Nursing Facility | Payer: Self-pay | Source: Ambulatory Visit | Attending: General Surgery

## 2017-02-08 ENCOUNTER — Inpatient Hospital Stay (HOSPITAL_COMMUNITY)
Admission: RE | Admit: 2017-02-08 | Discharge: 2017-02-15 | DRG: 330 | Disposition: A | Discharge status: Skilled Nursing Facility | Payer: Medicare HMO | Source: Ambulatory Visit | Attending: General Surgery | Admitting: General Surgery

## 2017-02-08 ENCOUNTER — Inpatient Hospital Stay (HOSPITAL_COMMUNITY): Payer: Medicare HMO | Admitting: Certified Registered Nurse Anesthetist

## 2017-02-08 DIAGNOSIS — R0609 Other forms of dyspnea: Secondary | ICD-10-CM | POA: Diagnosis present

## 2017-02-08 DIAGNOSIS — Z79899 Other long term (current) drug therapy: Secondary | ICD-10-CM | POA: Diagnosis not present

## 2017-02-08 DIAGNOSIS — I10 Essential (primary) hypertension: Secondary | ICD-10-CM | POA: Diagnosis present

## 2017-02-08 DIAGNOSIS — Z9889 Other specified postprocedural states: Secondary | ICD-10-CM

## 2017-02-08 DIAGNOSIS — Z7901 Long term (current) use of anticoagulants: Secondary | ICD-10-CM | POA: Diagnosis not present

## 2017-02-08 DIAGNOSIS — I4891 Unspecified atrial fibrillation: Secondary | ICD-10-CM | POA: Diagnosis present

## 2017-02-08 DIAGNOSIS — M1990 Unspecified osteoarthritis, unspecified site: Secondary | ICD-10-CM | POA: Diagnosis present

## 2017-02-08 DIAGNOSIS — I951 Orthostatic hypotension: Secondary | ICD-10-CM | POA: Diagnosis present

## 2017-02-08 DIAGNOSIS — F329 Major depressive disorder, single episode, unspecified: Secondary | ICD-10-CM | POA: Diagnosis present

## 2017-02-08 DIAGNOSIS — D509 Iron deficiency anemia, unspecified: Secondary | ICD-10-CM | POA: Diagnosis present

## 2017-02-08 DIAGNOSIS — C3411 Malignant neoplasm of upper lobe, right bronchus or lung: Secondary | ICD-10-CM | POA: Diagnosis present

## 2017-02-08 DIAGNOSIS — G4733 Obstructive sleep apnea (adult) (pediatric): Secondary | ICD-10-CM | POA: Diagnosis present

## 2017-02-08 DIAGNOSIS — K219 Gastro-esophageal reflux disease without esophagitis: Secondary | ICD-10-CM | POA: Diagnosis present

## 2017-02-08 DIAGNOSIS — I480 Paroxysmal atrial fibrillation: Secondary | ICD-10-CM | POA: Diagnosis present

## 2017-02-08 DIAGNOSIS — D62 Acute posthemorrhagic anemia: Secondary | ICD-10-CM | POA: Diagnosis not present

## 2017-02-08 DIAGNOSIS — I2510 Atherosclerotic heart disease of native coronary artery without angina pectoris: Secondary | ICD-10-CM | POA: Diagnosis present

## 2017-02-08 DIAGNOSIS — C3410 Malignant neoplasm of upper lobe, unspecified bronchus or lung: Secondary | ICD-10-CM | POA: Diagnosis present

## 2017-02-08 DIAGNOSIS — R5383 Other fatigue: Secondary | ICD-10-CM | POA: Diagnosis present

## 2017-02-08 DIAGNOSIS — C182 Malignant neoplasm of ascending colon: Secondary | ICD-10-CM | POA: Diagnosis present

## 2017-02-08 DIAGNOSIS — Z8249 Family history of ischemic heart disease and other diseases of the circulatory system: Secondary | ICD-10-CM | POA: Diagnosis not present

## 2017-02-08 DIAGNOSIS — D500 Iron deficiency anemia secondary to blood loss (chronic): Secondary | ICD-10-CM | POA: Diagnosis present

## 2017-02-08 HISTORY — PX: LAPAROSCOPIC PARTIAL COLECTOMY: SHX5907

## 2017-02-08 HISTORY — DX: Pneumonia, unspecified organism: J18.9

## 2017-02-08 HISTORY — DX: Urinary tract infection, site not specified: N39.0

## 2017-02-08 SURGERY — LAPAROSCOPIC PARTIAL COLECTOMY
Anesthesia: General

## 2017-02-08 MED ORDER — ONDANSETRON HCL 4 MG PO TABS
4.0000 mg | ORAL_TABLET | Freq: Four times a day (QID) | ORAL | Status: DC | PRN
Start: 2017-02-08 — End: 2017-02-12

## 2017-02-08 MED ORDER — SUGAMMADEX SODIUM 200 MG/2ML IV SOLN
INTRAVENOUS | Status: DC | PRN
  Administered 2017-02-08: 200 mg via INTRAVENOUS

## 2017-02-08 MED ORDER — FENTANYL CITRATE (PF) 100 MCG/2ML IJ SOLN: 25.0000 ug | INTRAMUSCULAR | Status: DC | PRN

## 2017-02-08 MED ORDER — ALVIMOPAN 12 MG PO CAPS
12.0000 mg | ORAL_CAPSULE | Freq: Once | ORAL | Status: AC
  Administered 2017-02-08: 12 mg via ORAL
  Filled 2017-02-08: qty 1

## 2017-02-08 MED ORDER — ACETAMINOPHEN 10 MG/ML IV SOLN
INTRAVENOUS | Status: AC
  Administered 2017-02-08: 1000 mg via INTRAVENOUS
  Filled 2017-02-08: qty 100

## 2017-02-08 MED ORDER — BUPROPION HCL ER (SR) 100 MG PO TB12
100.0000 mg | ORAL_TABLET | Freq: Two times a day (BID) | ORAL | Status: DC
  Administered 2017-02-08 – 2017-02-15 (×13): 100 mg via ORAL
  Filled 2017-02-08 (×14): qty 1

## 2017-02-08 MED ORDER — ONDANSETRON HCL 4 MG/2ML IJ SOLN
INTRAMUSCULAR | Status: AC
  Filled 2017-02-08: qty 2

## 2017-02-08 MED ORDER — ACETAMINOPHEN 500 MG PO TABS
1000.0000 mg | ORAL_TABLET | Freq: Four times a day (QID) | ORAL | Status: AC
  Administered 2017-02-08 – 2017-02-11 (×9): 1000 mg via ORAL
  Filled 2017-02-08 (×10): qty 2

## 2017-02-08 MED ORDER — CEFOTETAN DISODIUM-DEXTROSE 2-2.08 GM-% IV SOLR
INTRAVENOUS | Status: AC
  Filled 2017-02-08: qty 50

## 2017-02-08 MED ORDER — HYDROMORPHONE HCL 1 MG/ML IJ SOLN
INTRAMUSCULAR | Status: AC
  Administered 2017-02-08: 0.25 mg via INTRAVENOUS
  Filled 2017-02-08: qty 1

## 2017-02-08 MED ORDER — ATORVASTATIN CALCIUM 40 MG PO TABS
40.0000 mg | ORAL_TABLET | Freq: Every evening | ORAL | Status: DC
  Administered 2017-02-08 – 2017-02-14 (×5): 40 mg via ORAL
  Filled 2017-02-08 (×5): qty 1

## 2017-02-08 MED ORDER — LACTATED RINGERS IV SOLN: INTRAVENOUS | Status: DC

## 2017-02-08 MED ORDER — ROCURONIUM BROMIDE 50 MG/5ML IV SOSY
PREFILLED_SYRINGE | INTRAVENOUS | Status: DC | PRN
  Administered 2017-02-08: 40 mg via INTRAVENOUS
  Administered 2017-02-08: 10 mg via INTRAVENOUS

## 2017-02-08 MED ORDER — LIDOCAINE 2% (20 MG/ML) 5 ML SYRINGE
INTRAMUSCULAR | Status: AC
  Filled 2017-02-08: qty 5

## 2017-02-08 MED ORDER — ENOXAPARIN SODIUM 40 MG/0.4ML ~~LOC~~ SOLN
40.0000 mg | SUBCUTANEOUS | Status: DC
  Administered 2017-02-09 – 2017-02-15 (×6): 40 mg via SUBCUTANEOUS
  Filled 2017-02-08 (×6): qty 0.4

## 2017-02-08 MED ORDER — ESMOLOL HCL 100 MG/10ML IV SOLN
INTRAVENOUS | Status: AC
  Filled 2017-02-08: qty 10

## 2017-02-08 MED ORDER — DIPHENHYDRAMINE HCL 50 MG/ML IJ SOLN: 12.5000 mg | Freq: Four times a day (QID) | INTRAMUSCULAR | Status: DC | PRN

## 2017-02-08 MED ORDER — ONDANSETRON HCL 4 MG/2ML IJ SOLN
4.0000 mg | Freq: Four times a day (QID) | INTRAMUSCULAR | Status: DC | PRN
  Administered 2017-02-10 (×4): 4 mg via INTRAVENOUS
  Filled 2017-02-08 (×5): qty 2

## 2017-02-08 MED ORDER — ONDANSETRON HCL 4 MG/2ML IJ SOLN: 4.0000 mg | Freq: Once | INTRAMUSCULAR | Status: DC | PRN

## 2017-02-08 MED ORDER — LORATADINE 10 MG PO TABS
10.0000 mg | ORAL_TABLET | Freq: Every day | ORAL | Status: DC
  Administered 2017-02-08 – 2017-02-14 (×7): 10 mg via ORAL
  Filled 2017-02-08 (×7): qty 1

## 2017-02-08 MED ORDER — LORAZEPAM 1 MG PO TABS
1.0000 mg | ORAL_TABLET | Freq: Every day | ORAL | Status: DC
  Administered 2017-02-08 – 2017-02-14 (×7): 1 mg via ORAL
  Filled 2017-02-08 (×7): qty 1

## 2017-02-08 MED ORDER — LIDOCAINE 2% (20 MG/ML) 5 ML SYRINGE
INTRAMUSCULAR | Status: DC | PRN
  Administered 2017-02-08: 100 mg via INTRAVENOUS

## 2017-02-08 MED ORDER — MORPHINE SULFATE (PF) 4 MG/ML IV SOLN
2.0000 mg | INTRAVENOUS | Status: DC | PRN
  Administered 2017-02-08: 3 mg via INTRAVENOUS
  Administered 2017-02-08: 2 mg via INTRAVENOUS
  Administered 2017-02-08 – 2017-02-09 (×2): 4 mg via INTRAVENOUS
  Administered 2017-02-09 (×4): 3 mg via INTRAVENOUS
  Administered 2017-02-10 (×4): 4 mg via INTRAVENOUS
  Administered 2017-02-11 (×3): 2 mg via INTRAVENOUS
  Administered 2017-02-11: 4 mg via INTRAVENOUS
  Administered 2017-02-12: 2 mg via INTRAVENOUS
  Filled 2017-02-08 (×17): qty 1

## 2017-02-08 MED ORDER — 0.9 % SODIUM CHLORIDE (POUR BTL) OPTIME
TOPICAL | Status: DC | PRN
  Administered 2017-02-08: 2000 mL

## 2017-02-08 MED ORDER — BUPIVACAINE-EPINEPHRINE 0.25% -1:200000 IJ SOLN
INTRAMUSCULAR | Status: DC | PRN
  Administered 2017-02-08: 50 mL

## 2017-02-08 MED ORDER — FENTANYL CITRATE (PF) 100 MCG/2ML IJ SOLN
INTRAMUSCULAR | Status: DC | PRN
Start: 2017-02-08 — End: 2017-02-08
  Administered 2017-02-08 (×3): 50 ug via INTRAVENOUS
  Administered 2017-02-08 (×2): 25 ug via INTRAVENOUS
  Administered 2017-02-08: 50 ug via INTRAVENOUS

## 2017-02-08 MED ORDER — PANTOPRAZOLE SODIUM 40 MG PO TBEC
40.0000 mg | DELAYED_RELEASE_TABLET | Freq: Every day | ORAL | Status: DC
  Administered 2017-02-09 – 2017-02-15 (×6): 40 mg via ORAL
  Filled 2017-02-08 (×6): qty 1

## 2017-02-08 MED ORDER — DIPHENHYDRAMINE HCL 12.5 MG/5ML PO ELIX: 12.5000 mg | ORAL_SOLUTION | Freq: Four times a day (QID) | ORAL | Status: DC | PRN

## 2017-02-08 MED ORDER — ALVIMOPAN 12 MG PO CAPS
12.0000 mg | ORAL_CAPSULE | Freq: Two times a day (BID) | ORAL | Status: DC
  Administered 2017-02-09 – 2017-02-11 (×5): 12 mg via ORAL
  Filled 2017-02-08 (×7): qty 1

## 2017-02-08 MED ORDER — ROCURONIUM BROMIDE 50 MG/5ML IV SOSY
PREFILLED_SYRINGE | INTRAVENOUS | Status: AC
  Filled 2017-02-08: qty 5

## 2017-02-08 MED ORDER — ESCITALOPRAM OXALATE 20 MG PO TABS
10.0000 mg | ORAL_TABLET | Freq: Every day | ORAL | Status: DC
  Administered 2017-02-09 – 2017-02-15 (×6): 10 mg via ORAL
  Filled 2017-02-08 (×6): qty 1

## 2017-02-08 MED ORDER — DEXAMETHASONE SODIUM PHOSPHATE 10 MG/ML IJ SOLN
INTRAMUSCULAR | Status: DC | PRN
  Administered 2017-02-08: 10 mg via INTRAVENOUS

## 2017-02-08 MED ORDER — ORAL CARE MOUTH RINSE
15.0000 mL | Freq: Two times a day (BID) | OROMUCOSAL | Status: DC
  Administered 2017-02-08 – 2017-02-15 (×3): 15 mL via OROMUCOSAL

## 2017-02-08 MED ORDER — PROPOFOL 10 MG/ML IV BOLUS
INTRAVENOUS | Status: DC | PRN
  Administered 2017-02-08: 8 mg via INTRAVENOUS

## 2017-02-08 MED ORDER — PROMETHAZINE HCL 25 MG/ML IJ SOLN: 6.2500 mg | INTRAMUSCULAR | Status: DC | PRN

## 2017-02-08 MED ORDER — LACTATED RINGERS IV SOLN
INTRAVENOUS | Status: DC | PRN
  Administered 2017-02-08 (×2): via INTRAVENOUS

## 2017-02-08 MED ORDER — HYDROMORPHONE HCL 1 MG/ML IJ SOLN
0.2500 mg | INTRAMUSCULAR | Status: DC | PRN
  Administered 2017-02-08 (×7): 0.25 mg via INTRAVENOUS

## 2017-02-08 MED ORDER — BUPIVACAINE-EPINEPHRINE 0.25% -1:200000 IJ SOLN
INTRAMUSCULAR | Status: AC
  Filled 2017-02-08: qty 1

## 2017-02-08 MED ORDER — LACTATED RINGERS IR SOLN
Status: DC | PRN
  Administered 2017-02-08: 1000 mL

## 2017-02-08 MED ORDER — LABETALOL HCL 5 MG/ML IV SOLN
INTRAVENOUS | Status: DC | PRN
  Administered 2017-02-08 (×4): 2.5 mg via INTRAVENOUS

## 2017-02-08 MED ORDER — DEXAMETHASONE SODIUM PHOSPHATE 10 MG/ML IJ SOLN
INTRAMUSCULAR | Status: AC
  Filled 2017-02-08: qty 1

## 2017-02-08 MED ORDER — KCL IN DEXTROSE-NACL 20-5-0.45 MEQ/L-%-% IV SOLN
INTRAVENOUS | Status: DC
  Administered 2017-02-08: 14:00:00 via INTRAVENOUS
  Administered 2017-02-09: 1000 mL via INTRAVENOUS
  Administered 2017-02-09 – 2017-02-11 (×2): via INTRAVENOUS
  Filled 2017-02-08 (×7): qty 1000

## 2017-02-08 MED ORDER — CHLORHEXIDINE GLUCONATE 0.12 % MT SOLN
15.0000 mL | Freq: Two times a day (BID) | OROMUCOSAL | Status: DC
  Administered 2017-02-08 – 2017-02-15 (×14): 15 mL via OROMUCOSAL
  Filled 2017-02-08 (×13): qty 15

## 2017-02-08 MED ORDER — ACETAMINOPHEN 10 MG/ML IV SOLN
1000.0000 mg | Freq: Once | INTRAVENOUS | Status: AC
  Administered 2017-02-08: 1000 mg via INTRAVENOUS

## 2017-02-08 MED ORDER — SUCCINYLCHOLINE CHLORIDE 200 MG/10ML IV SOSY
PREFILLED_SYRINGE | INTRAVENOUS | Status: AC
  Filled 2017-02-08: qty 10

## 2017-02-08 MED ORDER — ONDANSETRON HCL 4 MG/2ML IJ SOLN
INTRAMUSCULAR | Status: DC | PRN
  Administered 2017-02-08: 4 mg via INTRAVENOUS

## 2017-02-08 MED ORDER — PROPOFOL 10 MG/ML IV BOLUS
INTRAVENOUS | Status: AC
  Filled 2017-02-08: qty 20

## 2017-02-08 MED ORDER — SUGAMMADEX SODIUM 200 MG/2ML IV SOLN
INTRAVENOUS | Status: AC
  Filled 2017-02-08: qty 2

## 2017-02-08 MED ORDER — CARVEDILOL 3.125 MG PO TABS
3.1250 mg | ORAL_TABLET | Freq: Two times a day (BID) | ORAL | Status: DC
  Administered 2017-02-08 – 2017-02-15 (×11): 3.125 mg via ORAL
  Filled 2017-02-08 (×11): qty 1

## 2017-02-08 MED ORDER — BUPIVACAINE LIPOSOME 1.3 % IJ SUSP
20.0000 mL | Freq: Once | INTRAMUSCULAR | Status: AC
  Administered 2017-02-08: 20 mL
  Filled 2017-02-08: qty 20

## 2017-02-08 MED ORDER — ALBUTEROL SULFATE (2.5 MG/3ML) 0.083% IN NEBU
3.0000 mL | INHALATION_SOLUTION | Freq: Four times a day (QID) | RESPIRATORY_TRACT | Status: DC | PRN
  Administered 2017-02-09: 3 mL via RESPIRATORY_TRACT
  Filled 2017-02-08: qty 3

## 2017-02-08 MED ORDER — FLUTICASONE FUROATE-VILANTEROL 100-25 MCG/INH IN AEPB
1.0000 | INHALATION_SPRAY | Freq: Every day | RESPIRATORY_TRACT | Status: DC
  Administered 2017-02-08 – 2017-02-15 (×7): 1 via RESPIRATORY_TRACT
  Filled 2017-02-08: qty 28

## 2017-02-08 MED ORDER — DEXTROSE 5 % IV SOLN
2.0000 g | INTRAVENOUS | Status: AC
  Administered 2017-02-08: 2 g via INTRAVENOUS
  Filled 2017-02-08: qty 2

## 2017-02-08 MED ORDER — SUCCINYLCHOLINE CHLORIDE 200 MG/10ML IV SOSY
PREFILLED_SYRINGE | INTRAVENOUS | Status: DC | PRN
  Administered 2017-02-08: 80 mg via INTRAVENOUS

## 2017-02-08 MED ORDER — FENTANYL CITRATE (PF) 250 MCG/5ML IJ SOLN
INTRAMUSCULAR | Status: AC
  Filled 2017-02-08: qty 5

## 2017-02-08 MED ORDER — ESMOLOL HCL 100 MG/10ML IV SOLN
INTRAVENOUS | Status: DC | PRN
  Administered 2017-02-08 (×2): 20 mg via INTRAVENOUS

## 2017-02-08 SURGICAL SUPPLY — 71 items
APPLIER CLIP 5 13 M/L LIGAMAX5 (MISCELLANEOUS)
BLADE EXTENDED COATED 6.5IN (ELECTRODE) IMPLANT
CABLE HIGH FREQUENCY MONO STRZ (ELECTRODE) ×3 IMPLANT
CELLS DAT CNTRL 66122 CELL SVR (MISCELLANEOUS) IMPLANT
CHLORAPREP W/TINT 26ML (MISCELLANEOUS) ×3 IMPLANT
CLIP APPLIE 5 13 M/L LIGAMAX5 (MISCELLANEOUS) IMPLANT
COUNTER NEEDLE 20 DBL MAG RED (NEEDLE) ×3 IMPLANT
COVER MAYO STAND STRL (DRAPES) ×9 IMPLANT
COVER SURGICAL LIGHT HANDLE (MISCELLANEOUS) IMPLANT
DECANTER SPIKE VIAL GLASS SM (MISCELLANEOUS) ×3 IMPLANT
DERMABOND ADVANCED (GAUZE/BANDAGES/DRESSINGS) ×2
DERMABOND ADVANCED .7 DNX12 (GAUZE/BANDAGES/DRESSINGS) ×1 IMPLANT
DRAIN CHANNEL 19F RND (DRAIN) IMPLANT
DRAPE LAPAROSCOPIC ABDOMINAL (DRAPES) ×3 IMPLANT
DRAPE SURG IRRIG POUCH 19X23 (DRAPES) ×3 IMPLANT
DRSG OPSITE POSTOP 4X10 (GAUZE/BANDAGES/DRESSINGS) IMPLANT
DRSG OPSITE POSTOP 4X6 (GAUZE/BANDAGES/DRESSINGS) IMPLANT
DRSG OPSITE POSTOP 4X8 (GAUZE/BANDAGES/DRESSINGS) IMPLANT
ELECT PENCIL ROCKER SW 15FT (MISCELLANEOUS) ×6 IMPLANT
ELECT REM PT RETURN 15FT ADLT (MISCELLANEOUS) ×3 IMPLANT
EVACUATOR SILICONE 100CC (DRAIN) IMPLANT
GAUZE SPONGE 4X4 12PLY STRL (GAUZE/BANDAGES/DRESSINGS) IMPLANT
GLOVE BIO SURGEON STRL SZ 6.5 (GLOVE) ×4 IMPLANT
GLOVE BIO SURGEONS STRL SZ 6.5 (GLOVE) ×2
GLOVE BIOGEL PI IND STRL 7.0 (GLOVE) ×2 IMPLANT
GLOVE BIOGEL PI INDICATOR 7.0 (GLOVE) ×4
GOWN STRL REUS W/TWL 2XL LVL3 (GOWN DISPOSABLE) ×6 IMPLANT
GOWN STRL REUS W/TWL XL LVL3 (GOWN DISPOSABLE) ×12 IMPLANT
GRASPER ENDOPATH ANVIL 10MM (MISCELLANEOUS) IMPLANT
HOLDER FOLEY CATH W/STRAP (MISCELLANEOUS) ×3 IMPLANT
IRRIG SUCT STRYKERFLOW 2 WTIP (MISCELLANEOUS) ×3
IRRIGATION SUCT STRKRFLW 2 WTP (MISCELLANEOUS) ×1 IMPLANT
LEGGING LITHOTOMY PAIR STRL (DRAPES) IMPLANT
LUBRICANT JELLY K Y 4OZ (MISCELLANEOUS) ×3 IMPLANT
PACK COLON (CUSTOM PROCEDURE TRAY) ×3 IMPLANT
PAD POSITIONING PINK XL (MISCELLANEOUS) ×3 IMPLANT
PORT LAP GEL ALEXIS MED 5-9CM (MISCELLANEOUS) ×3 IMPLANT
POSITIONER SURGICAL ARM (MISCELLANEOUS) ×3 IMPLANT
RELOAD PROXIMATE 75MM BLUE (ENDOMECHANICALS) ×6 IMPLANT
RTRCTR WOUND ALEXIS 18CM MED (MISCELLANEOUS)
SCISSORS LAP 5X35 DISP (ENDOMECHANICALS) ×3 IMPLANT
SEALER TISSUE G2 STRG ARTC 35C (ENDOMECHANICALS) ×3 IMPLANT
SEALER TISSUE X1 CVD JAW (INSTRUMENTS) IMPLANT
SLEEVE XCEL OPT CAN 5 100 (ENDOMECHANICALS) ×3 IMPLANT
SPONGE DRAIN TRACH 4X4 STRL 2S (GAUZE/BANDAGES/DRESSINGS) IMPLANT
SPONGE LAP 18X18 X RAY DECT (DISPOSABLE) IMPLANT
STAPLER GUN LINEAR PROX 60 (STAPLE) ×3 IMPLANT
STAPLER PROXIMATE 75MM BLUE (STAPLE) ×3 IMPLANT
STAPLER VISISTAT 35W (STAPLE) IMPLANT
SUT ETHILON 2 0 PS N (SUTURE) IMPLANT
SUT NOVA NAB GS-21 0 18 T12 DT (SUTURE) ×6 IMPLANT
SUT PDS AB 1 CTX 36 (SUTURE) IMPLANT
SUT PDS AB 1 TP1 96 (SUTURE) IMPLANT
SUT PROLENE 2 0 KS (SUTURE) ×3 IMPLANT
SUT SILK 2 0 (SUTURE) ×2
SUT SILK 2 0 SH CR/8 (SUTURE) ×3 IMPLANT
SUT SILK 2-0 18XBRD TIE 12 (SUTURE) ×1 IMPLANT
SUT SILK 3 0 (SUTURE) ×2
SUT SILK 3 0 SH CR/8 (SUTURE) ×3 IMPLANT
SUT SILK 3-0 18XBRD TIE 12 (SUTURE) ×1 IMPLANT
SUT VIC AB 2-0 SH 18 (SUTURE) ×3 IMPLANT
SUT VIC AB 4-0 PS2 27 (SUTURE) ×3 IMPLANT
SYS LAPSCP GELPORT 120MM (MISCELLANEOUS)
SYSTEM LAPSCP GELPORT 120MM (MISCELLANEOUS) IMPLANT
TOWEL OR NON WOVEN STRL DISP B (DISPOSABLE) ×3 IMPLANT
TRAY FOLEY W/METER SILVER 16FR (SET/KITS/TRAYS/PACK) IMPLANT
TROCAR BLADELESS OPT 5 100 (ENDOMECHANICALS) ×3 IMPLANT
TROCAR XCEL BLUNT TIP 100MML (ENDOMECHANICALS) IMPLANT
TUBING CONNECTING 10 (TUBING) ×2 IMPLANT
TUBING CONNECTING 10' (TUBING) ×1
TUBING INSUF HEATED (TUBING) ×3 IMPLANT

## 2017-02-08 NOTE — Transfer of Care (Signed)
Immediate Anesthesia Transfer of Care Note  Patient: Pamela Mcguire  Procedure(s) Performed: Procedure(s): LAPAROSCOPIC PARTIAL COLECTOMY (N/A)  Patient Location: PACU  Anesthesia Type:General  Level of Consciousness: sedated, patient cooperative and responds to stimulation  Airway & Oxygen Therapy: Patient Spontanous Breathing and Patient connected to face mask oxygen  Post-op Assessment: Report given to RN and Post -op Vital signs reviewed and stable  Post vital signs: Reviewed and stable  Last Vitals:  Vitals:   02/08/17 0654  BP: 135/66  Pulse: 97  Resp: 18  Temp: 36.6 C    Last Pain:  Vitals:   02/08/17 0654  TempSrc: Oral      Patients Stated Pain Goal: 4 (22/48/25 0037)  Complications: No apparent anesthesia complications

## 2017-02-08 NOTE — Op Note (Signed)
02/08/2017  10:45 AM  PATIENT:  Pamela Mcguire  82 y.o. female  Patient Care Team: Golden Circle, FNP as PCP - General (Family Medicine)  PRE-OPERATIVE DIAGNOSIS:  ascending colon cancer  POST-OPERATIVE DIAGNOSIS:  ascending colon cancer  PROCEDURE:   LAPAROSCOPIC PARTIAL COLECTOMY    Surgeon(s): Leighton Ruff, MD Johnathan Hausen, MD  ASSISTANT: Dr Hassell Done   ANESTHESIA:   general  EBL: 2m  Total I/O In: 1000 [I.V.:1000] Out: 125 [Urine:75; Blood:50]  SPECIMEN:  Source of Specimen:  R colon  DISPOSITION OF SPECIMEN:  PATHOLOGY  COUNTS:  YES  PLAN OF CARE: Admit to inpatient   PATIENT DISPOSITION:  PACU - hemodynamically stable.   INDICATIONS: This is a 82y.o. female who presented to my office with anemia and an ascending colon cancer. The risk and benefits and alternative treatments were explained to the patient prior to the OR and the patient has elected to proceed with lap R colectomy.  Consent was signed and placed on chart prior to the OR.   OR FINDINGS:  R colon mass.  No evidence of metastatic disease  DESCRIPTION:  The patient was identified & brought into the operating room. The patient was positioned supine with both arms tucked. SCDs were active during the entire case. The patient underwent general anesthesia without any difficulty. A foley catheter was inserted under sterile conditions. The abdomen was prepped and draped in a sterile fashion. A Surgical Timeout confirmed our plan.  I made a vertical incision around the umbilicus. I dissected down through the subcutaneous tissues using blunt dissection and divided the fascia with cautery.  Blunt dissection was used to obtain peritoneal entry.  I placed an alexis wound protector and cap.  We induced carbon dioxide insufflation. Camera inspection revealed no injury. I placed additional ports under direct laparoscopic visualization.  I evaluated the entire abdomen laparoscopically.  The liver appeared normal, the  large and small bowel were normal as well.  There were no signs of metastatic disease.  I began by identifying the ileocolic artery and vein within the mesentery. Dissection was bluntly carried around these structures. The duodenum was identified and free from the structures. I then separated the structures bluntly and used the Enseal device to transect these separately.  I developed the retroperitoneal plane bluntly.  I then freed the appendix off its attachments to the pelvic wall. I mobilized the terminal ileum.  I took care to avoid injuring any retroperitoneal structures.  After this I began to mobilize laterally down the white line of Toldt and then took down the hepatic flexure using the Enseal device. I mobilized the omentum off of the right transverse colon. The entire colon was then flipped medially and mobilized off of the retroperitoneal structures until I could visualize the lateral edge of the duodenum underneath.  I gently freed the duodenal attachments.  At that point, I removed the cap on the alexis wound protector. The terminal ileum and right colon were then removed from the wound. The terminal ileum was transected using a GIA blue load stapler. The remaining mesentery was divided using the Enseal device. I identified a portion of the transverse colon just distal to the hepatic flexure and proximal to the right branch of the middle colic vessels. This was transected using another blue load GIA stapler.  An anastomosis was created between the terminal ileum and the transverse colon. This was done using a GIA blue load stapler.  The common enterotomy channel was closed using a TA  60 blue load stapler. Hemostasis was good at the staple line. Several 3-0 silk sutures were used to imbricate the edge of the anastomosis. An anti-tension suture was placed in the crotch of the anastomosis. This was then placed back into the abdomen. The abdomen was then irrigated with normal saline  Hemostasis was good.  The omentum was then brought down over the anastomosis. The Alexis wound protector was removed, and we switched to clean instruments, gowns and drapes.  The fascia was then closed using #0 Novafil interrupted sutures. The abdomen was then re-insufflated and evaluated for hemostasis. The right pericolic gutter appeared to be clean. Hemostasis was good. The abdomen was then desufflated and the subcutaneous tissue of the umbilical incision was closed using interrupted 2-0 Vicryl sutures. The skin was then closed using 4-0 Vicryl sutures. Dermabond was placed on the port sites and a sterile dressing was placed over the abdominal incision. All counts were correct per operating room staff. The patient was then awakened from anesthesia and sent to the post anesthesia care unit in stable condition.

## 2017-02-08 NOTE — Anesthesia Preprocedure Evaluation (Signed)
Anesthesia Evaluation  Patient identified by MRN, date of birth, ID band Patient awake    Reviewed: Allergy & Precautions, NPO status , Patient's Chart, lab work & pertinent test results  Airway Mallampati: II  TM Distance: >3 FB Neck ROM: Full    Dental no notable dental hx.    Pulmonary sleep apnea , former smoker,  Non-small cell carcinoma of lung, stage 3 (HCC) 04/30/2015    Pulmonary exam normal breath sounds clear to auscultation       Cardiovascular hypertension, Normal cardiovascular exam+ dysrhythmias Atrial Fibrillation  Rhythm:Regular Rate:Normal     Neuro/Psych negative neurological ROS  negative psych ROS   GI/Hepatic negative GI ROS, Neg liver ROS,   Endo/Other  negative endocrine ROS  Renal/GU negative Renal ROS  negative genitourinary   Musculoskeletal negative musculoskeletal ROS (+)   Abdominal   Peds negative pediatric ROS (+)  Hematology negative hematology ROS (+)   Anesthesia Other Findings   Reproductive/Obstetrics negative OB ROS                             Anesthesia Physical Anesthesia Plan  ASA: III  Anesthesia Plan: General   Post-op Pain Management:    Induction: Intravenous  Airway Management Planned:   Additional Equipment:   Intra-op Plan:   Post-operative Plan: Extubation in OR  Informed Consent: I have reviewed the patients History and Physical, chart, labs and discussed the procedure including the risks, benefits and alternatives for the proposed anesthesia with the patient or authorized representative who has indicated his/her understanding and acceptance.   Dental advisory given  Plan Discussed with: CRNA and Surgeon  Anesthesia Plan Comments:         Anesthesia Quick Evaluation

## 2017-02-08 NOTE — Anesthesia Procedure Notes (Signed)
Procedure Name: Intubation Date/Time: 02/08/2017 8:38 AM Performed by: Maxwell Caul Pre-anesthesia Checklist: Patient identified, Emergency Drugs available, Suction available and Patient being monitored Patient Re-evaluated:Patient Re-evaluated prior to inductionOxygen Delivery Method: Circle system utilized Preoxygenation: Pre-oxygenation with 100% oxygen Intubation Type: IV induction Ventilation: Mask ventilation without difficulty Laryngoscope Size: Mac and 4 Grade View: Grade I Tube type: Oral Tube size: 7.0 mm Number of attempts: 1 Airway Equipment and Method: Stylet and Oral airway Placement Confirmation: ETT inserted through vocal cords under direct vision,  positive ETCO2 and breath sounds checked- equal and bilateral Secured at: 21 cm Tube secured with: Tape Dental Injury: Teeth and Oropharynx as per pre-operative assessment

## 2017-02-08 NOTE — Interval H&P Note (Signed)
History and Physical Interval Note:  02/08/2017 8:14 AM  Pamela Mcguire  has presented today for surgery, with the diagnosis of ascending colon cancer  The various methods of treatment have been discussed with the patient and family. After consideration of risks, benefits and other options for treatment, the patient has consented to  Procedure(s): LAPAROSCOPIC PARTIAL COLECTOMY (N/A) as a surgical intervention .  The patient's history has been reviewed, patient examined, no change in status, stable for surgery.  I have reviewed the patient's chart and labs.  Questions were answered to the patient's satisfaction.   Per cardiology she is at low risk for surgery and should cont to hold anticoagulation til after surgery.    Rosario Adie, MD  [facility]

## 2017-02-08 NOTE — H&P (View-Only) (Signed)
The patient is a 82 year old female who presents with colorectal cancer. 82 year old female who is followed for lung cancer underwent a PET scan for evaluation of a lung lesion. This showed a mass in the cecal region, which was active on PET scan. Colonoscopy revealed a mass in the cecum. Biopsies confirmed invasive adenocarcinoma. Patient does struggle with anemia and has had transfusions recently. She reports dark, loose stools. She has atrial fibrillation and was taking Elliquis. This has been stopped due to her bleeding.   Past Surgical History Malachy Moan, [place]; 01/30/2017 2:11 PM) Breast Biopsy  Right. Breast Mass; Local Excision  Right. Cesarean Section - 1  Colon Polyp Removal - Colonoscopy  Lung Surgery  Right.  Diagnostic Studies History Malachy Moan, [place]; 01/30/2017 2:11 PM) Colonoscopy  within last year Pap Smear  1-5 years ago  Allergies Malachy Moan, RMA; 01/30/2017 2:11 PM) No Known Allergies 01/30/2017  Medication History Malachy Moan, RMA; 01/30/2017 2:12 PM) OxyCODONE HCl ('5MG'$  Tablet, Oral) Active. LORazepam ('1MG'$  Tablet, Oral) Active. Atorvastatin Calcium ('40MG'$  Tablet, Oral) Active. Betamethasone Dipropionate (0.05% Cream, External) Active. BuPROPion HCl ER (SR) ('100MG'$  Tablet ER 12HR, Oral) Active. Carvedilol (3.'125MG'$  Tablet, Oral) Active. Ciprofloxacin HCl ('250MG'$  Tablet, Oral) Active. Escitalopram Oxalate ('10MG'$  Tablet, Oral) Active. Loratadine ('10MG'$  Tablet, Oral) Active. Pantoprazole Sodium ('40MG'$  Tablet DR, Oral) Active. Medications Reconciled  Social History Malachy Moan, [place]; 01/30/2017 2:11 PM) Alcohol use  Moderate alcohol use. Caffeine use  Coffee. Tobacco use  Former smoker.  Family History Malachy Moan, [place]; 01/30/2017 2:11 PM) Heart Disease  Father. Hypertension  Son. Migraine Headache  Daughter.  Pregnancy / Birth History Malachy Moan, [place]; 01/30/2017 2:11 PM) Age at menarche  71 years. Age  of menopause  63-55 Gravida  60 Maternal age  42-20 Para  79  Other Problems Malachy Moan, [place]; 01/30/2017 2:11 PM) Alcohol Abuse  Arthritis  Atrial Fibrillation  Back Pain  Breast Cancer  Diverticulosis  Gastroesophageal Reflux Disease  High blood pressure  Lump In Breast  Lung Cancer  Sleep Apnea     Review of Systems Malachy Moan RMA; 01/30/2017 2:11 PM) General Present- Weight Gain. Not Present- Appetite Loss, Chills, Fatigue, Fever, Night Sweats and Weight Loss. Skin Not Present- Change in Wart/Mole, Dryness, Hives, Jaundice, New Lesions, Non-Healing Wounds, Rash and Ulcer. HEENT Present- Hearing Loss and Wears glasses/contact lenses. Not Present- Earache, Hoarseness, Nose Bleed, Oral Ulcers, Ringing in the Ears, Seasonal Allergies, Sinus Pain, Sore Throat, Visual Disturbances and Yellow Eyes. Respiratory Present- Difficulty Breathing. Not Present- Bloody sputum, Chronic Cough, Snoring and Wheezing. Cardiovascular Present- Difficulty Breathing Lying Down and Shortness of Breath. Not Present- Chest Pain, Leg Cramps, Palpitations, Rapid Heart Rate and Swelling of Extremities. Gastrointestinal Present- Chronic diarrhea. Not Present- Abdominal Pain, Bloating, Bloody Stool, Change in Bowel Habits, Constipation, Difficulty Swallowing, Excessive gas, Gets full quickly at meals, Hemorrhoids, Indigestion, Nausea, Rectal Pain and Vomiting. Musculoskeletal Present- Back Pain and Joint Pain. Not Present- Joint Stiffness, Muscle Pain, Muscle Weakness and Swelling of Extremities. Neurological Present- Decreased Memory, Trouble walking and Weakness. Not Present- Fainting, Headaches, Numbness, Seizures, Tingling and Tremor. Psychiatric Present- Anxiety and Depression. Not Present- Bipolar, Change in Sleep Pattern, Fearful and Frequent crying. Hematology Present- Blood Thinners. Not Present- Easy Bruising, Excessive bleeding, Gland problems, HIV and Persistent  Infections.  Vitals Malachy Moan RMA; 01/30/2017 2:13 PM) 01/30/2017 2:12 PM Weight: 188.4 lb Height: 66.5in Body Surface Area: 1.96 m Body Mass Index: 29.95 kg/m  Temp.: 98.100F  Pulse: 93 (Regular)  BP: 136/70 (Sitting,  Left Arm, Standard)       Physical Exam Leighton Ruff MD; 01/28/3556 5:08 PM) General Mental Status-Alert. General Appearance-Not in acute distress. Build & Nutrition-Well nourished. Posture-Normal posture. Gait-Normal.  Head and Neck Head-normocephalic, atraumatic with no lesions or palpable masses. Trachea-midline.  Chest and Lung Exam Chest and lung exam reveals -on auscultation, normal breath sounds, no adventitious sounds and normal vocal resonance.  Cardiovascular Cardiovascular examination reveals -normal heart sounds, regular rate and rhythm with no murmurs and no digital clubbing, cyanosis, edema, increased warmth or tenderness.  Abdomen Inspection Inspection of the abdomen reveals - No Hernias. Palpation/Percussion Palpation and Percussion of the abdomen reveal - Soft, Non Tender, No Rigidity (guarding), No hepatosplenomegaly and No Palpable abdominal masses.  Neurologic Neurologic evaluation reveals -alert and oriented x 3 with no impairment of recent or remote memory, normal attention span and ability to concentrate, normal sensation and normal coordination.  Musculoskeletal Normal Exam - Bilateral-Upper Extremity Strength Normal and Lower Extremity Strength Normal.    Assessment & Plan Leighton Ruff MD; 01/28/2024 2:51 PM) COLON CANCER, ASCENDING (C18.2) Impression: 82 year old female who presents to the office for evaluation of a newly diagnosed ascending colon cancer. This was found after the patient was noted to be anemic and PET scan showed a lesion in the right colon. A colonoscopy was performed and this showed a mass in the descending colon which was biopsied. Biopsy returned as adenocarcinoma.  Patient has lung cancer and does have lesions noted in her long as well on PET scan. We will follow-up with her cardiologist Dr. Debara Pickett to evaluate her perioperative surgical risk. If this seems acceptable, we will plan on doing a laparoscopic right colectomy mainly to control her bleeding. This has been discussed with the patient and her daughters in detail. All questions were answered. The surgery and anatomy were described to the patient as well as the risks of surgery and the possible complications. These include: Bleeding, deep abdominal infections and possible wound complications such as hernia and infection, damage to adjacent structures, leak of surgical connections, which can lead to other surgeries and possibly an ostomy, possible need for other procedures, such as abscess drains in radiology, possible prolonged hospital stay, possible diarrhea from removal of part of the colon, possible constipation from narcotics, possible bowel, bladder or sexual dysfunction if having rectal surgery, prolonged fatigue/weakness or appetite loss, possible early recurrence of of disease, possible complications of their medical problems such as heart disease or arrhythmias or lung problems, death (less than 1%). I believe the patient understands and wishes to proceed with the surgery. Current Plans

## 2017-02-08 NOTE — Anesthesia Postprocedure Evaluation (Addendum)
Anesthesia Post Note  Patient: Pamela Mcguire  Procedure(s) Performed: Procedure(s) (LRB): LAPAROSCOPIC PARTIAL COLECTOMY (N/A)  Patient location during evaluation: PACU Anesthesia Type: General Level of consciousness: awake and alert Pain management: pain level controlled Vital Signs Assessment: post-procedure vital signs reviewed and stable Respiratory status: spontaneous breathing, nonlabored ventilation, respiratory function stable and patient connected to nasal cannula oxygen Cardiovascular status: blood pressure returned to baseline and stable Postop Assessment: no signs of nausea or vomiting Anesthetic complications: no       Last Vitals:  Vitals:   02/08/17 1145 02/08/17 1200  BP: (!) 127/54 (!) 118/57  Pulse: 87 86  Resp: 12 13  Temp:      Last Pain:  Vitals:   02/08/17 1145  TempSrc:   PainSc: 7                  Baylor Teegarden S

## 2017-02-09 ENCOUNTER — Encounter (HOSPITAL_COMMUNITY): Payer: Self-pay | Admitting: *Deleted

## 2017-02-09 LAB — CBC
HEMATOCRIT: 26.3 % — AB (ref 36.0–46.0)
HEMOGLOBIN: 7.8 g/dL — AB (ref 12.0–15.0)
MCH: 22.1 pg — AB (ref 26.0–34.0)
MCHC: 29.7 g/dL — AB (ref 30.0–36.0)
MCV: 74.5 fL — ABNORMAL LOW (ref 78.0–100.0)
Platelets: 388 10*3/uL (ref 150–400)
RBC: 3.53 MIL/uL — ABNORMAL LOW (ref 3.87–5.11)
RDW: 18 % — ABNORMAL HIGH (ref 11.5–15.5)
WBC: 13.9 10*3/uL — ABNORMAL HIGH (ref 4.0–10.5)

## 2017-02-09 LAB — BASIC METABOLIC PANEL
ANION GAP: 8 (ref 5–15)
BUN: 13 mg/dL (ref 6–20)
CO2: 23 mmol/L (ref 22–32)
Calcium: 8.8 mg/dL — ABNORMAL LOW (ref 8.9–10.3)
Chloride: 104 mmol/L (ref 101–111)
Creatinine, Ser: 0.91 mg/dL (ref 0.44–1.00)
GFR calc Af Amer: >60 mL/min (ref >60)
GFR calc non Af Amer: 58 mL/min — ABNORMAL LOW (ref >60)
GLUCOSE: 182 mg/dL — AB (ref 65–99)
POTASSIUM: 4.5 mmol/L (ref 3.5–5.1)
Sodium: 135 mmol/L (ref 135–145)

## 2017-02-09 LAB — CEA: CEA: 2.9 ng/mL (ref 0.0–4.7)

## 2017-02-09 NOTE — Progress Notes (Signed)
Pt feeling periods of shortness of breath, notified respiratory for a breathing tx. Will continue to monitor.

## 2017-02-09 NOTE — NC FL2 (Signed)
[place] MEDICAID FL2 LEVEL OF CARE SCREENING TOOL     IDENTIFICATION  Patient Name: Pamela Mcguire Birthdate: 11-07-1935 Sex: female Admission Date (Current Location): 02/08/2017  [facility] and [place] Number:  Herbalist and Address:  [facility],  [place] [address], [place]      Provider Number: [id number]  Attending Physician Name and Address:  Leighton Ruff, MD  Relative Name and Phone Number:       Current Level of Care: Hospital Recommended Level of Care: [place] Prior Approval Number:    Date Approved/Denied:   PASRR Number: [id number]  Discharge Plan: SNF    Current Diagnoses: Patient Active Problem List   Diagnosis Date Noted  . Colon cancer ([place]) 02/08/2017  . Iron deficiency anemia   . Fundic gland polyps of stomach, benign   . Benign neoplasm of ascending colon   . Benign neoplasm of transverse colon   . Benign neoplasm of descending colon   . Colonic mass   . Heme positive stool 12/22/2016  . Chronic anticoagulation 12/22/2016  . Deficiency anemia 12/06/2016  . Dehydration 12/06/2016  . Diarrhea 12/06/2016  . Rash and nonspecific skin eruption 06/21/2016  . Cough 06/21/2016  . Fracture of L1 vertebra ([place]) 10/19/2015  . Weakness   . Urinary tract infectious disease   . HCAP (healthcare-associated pneumonia) 09/15/2015  . Compression fracture 09/13/2015  . Fall at home 09/13/2015  . Atrial fibrillation ([place]) 09/13/2015  . History of lung cancer   . Mobility impaired   . Fatigue 08/18/2015  . Depression 08/18/2015  . Frequent stools 08/18/2015  . Tachypnea 06/22/2015  . SIRS (systemic inflammatory response syndrome) ([place]) 06/22/2015  . Lung cancer, upper lobe ([place]) 06/22/2015  . Anemia 06/22/2015  . Leukopenia due to antineoplastic chemotherapy ([place]) 06/22/2015  . Hypocalcemia 06/22/2015  . Conjunctivitis 06/06/2015  . UTI (urinary tract infection) 06/06/2015  . Adenocarcinoma of upper lobe of  right lung 04/30/2015  . Sleep apnea 04/28/2015  . Mediastinal adenopathy 04/24/2015  . Severe obstructive sleep apnea 03/27/2015  . Lung mass 03/27/2015  . Folate deficiency 09/21/2014  . Paroxysmal a-fib ([place]) 05/27/2014  . DOE (dyspnea on exertion) 05/27/2014  . HTN (hypertension) 05/27/2014  . Dyslipidemia 05/27/2014  . Bradycardia 05/27/2014  . Hair loss 05/27/2014  . Breast cancer ([place]) 04/17/2014  . Prediabetes 04/17/2014  . CAD (coronary artery disease) 04/16/2014  . Diverticulitis 04/16/2014  . Hyperlipidemia 04/16/2014    Orientation RESPIRATION BLADDER Height & Weight     Self, Time, Situation, Place  Normal Continent Weight: 187 lb (84.8 kg) Height:  '5\' 6"'$  (167.6 cm)  BEHAVIORAL SYMPTOMS/MOOD NEUROLOGICAL BOWEL NUTRITION STATUS      Continent Diet (Clear Liquid Diet)  AMBULATORY STATUS COMMUNICATION OF NEEDS Skin   Extensive Assist Verbally Normal                       Personal Care Assistance Level of Assistance  Bathing, Dressing Bathing Assistance: Limited assistance   Dressing Assistance: Limited assistance     Functional Limitations Info             SPECIAL CARE FACTORS FREQUENCY  PT (By licensed PT), OT (By licensed OT)     PT Frequency: 5 OT Frequency: 5            Contractures      Additional Factors Info  Code Status, Allergies Code Status Info: Fullcode Allergies Info: NKDA  Current Medications (02/09/2017):  This is the current hospital active medication list Current Facility-Administered Medications  Medication Dose Route Frequency Provider Last Rate Last Dose  . acetaminophen (TYLENOL) tablet 1,000 mg  5,885 mg Oral O2D Leighton Ruff, MD   7,412 mg at 02/09/17 1142  . albuterol (PROVENTIL) (2.5 MG/3ML) 0.083% nebulizer solution 3 mL  3 mL Inhalation I7O PRN Leighton Ruff, MD   3 mL at 02/09/17 0432  . alvimopan (ENTEREG) capsule 12 mg  12 mg Oral BID Leighton Ruff, MD   12 mg at 02/09/17 0951  . atorvastatin  (LIPITOR) tablet 40 mg  40 mg Oral QPM Leighton Ruff, MD   40 mg at 02/08/17 1718  . buPROPion (WELLBUTRIN SR) 12 hr tablet 100 mg  676 mg Oral BID Leighton Ruff, MD   720 mg at 02/09/17 0951  . carvedilol (COREG) tablet 3.125 mg  3.125 mg Oral BID WC Leighton Ruff, MD   9.470 mg at 02/09/17 0802  . chlorhexidine (PERIDEX) 0.12 % solution 15 mL  15 mL Mouth Rinse BID Leighton Ruff, MD   15 mL at 02/09/17 0953  . dextrose 5 % and 0.45 % NaCl with KCl 20 mEq/L infusion   Intravenous Continuous Leighton Ruff, MD 50 mL/hr at 02/09/17 0800 50 mL/hr at 02/09/17 0800  . diphenhydrAMINE (BENADRYL) 12.5 MG/5ML elixir 12.5 mg  12.5 mg Oral J6G PRN Leighton Ruff, MD       Or  . diphenhydrAMINE (BENADRYL) injection 12.5 mg  12.5 mg Intravenous E3M PRN Leighton Ruff, MD      . enoxaparin (LOVENOX) injection 40 mg  40 mg Subcutaneous O29U Leighton Ruff, MD   40 mg at 02/09/17 0804  . escitalopram (LEXAPRO) tablet 10 mg  10 mg Oral Daily Leighton Ruff, MD   10 mg at 02/09/17 7654  . fluticasone furoate-vilanterol (BREO ELLIPTA) 100-25 MCG/INH 1 puff  1 puff Inhalation Daily Leighton Ruff, MD   1 puff at 02/09/17 [phone]  . loratadine (CLARITIN) tablet 10 mg  10 mg Oral QHS Leighton Ruff, MD   10 mg at 02/08/17 2102  . LORazepam (ATIVAN) tablet 1 mg  1 mg Oral QHS Leighton Ruff, MD   1 mg at 02/08/17 2102  . MEDLINE mouth rinse  15 mL Mouth Rinse [id number] Leighton Ruff, MD   15 mL at 02/08/17 2000  . morphine 4 MG/ML injection 2-4 mg  2-4 mg Intravenous L2X PRN Leighton Ruff, MD   3 mg at 02/09/17 1141  . ondansetron (ZOFRAN) tablet 4 mg  4 mg Oral N1Z PRN Leighton Ruff, MD       Or  . ondansetron [facility]) injection 4 mg  4 mg Intravenous G0F PRN Leighton Ruff, MD      . pantoprazole (PROTONIX) EC tablet 40 mg  40 mg Oral Daily Leighton Ruff, MD   40 mg at 02/09/17 7494     Discharge Medications: Please see discharge summary for a list of discharge medications.  Relevant Imaging Results:  Relevant Lab  Results:   Additional Information SSN: [id number]  Standley Brooking, LCSW

## 2017-02-09 NOTE — Evaluation (Signed)
Physical Therapy Evaluation Patient Details Name: Pamela Mcguire MRN: [id number] DOB: August 27, 1935 Today's Date: 02/09/2017   History of Present Illness  82 y.o. female with h/o CAD, afib, HTN, GERD, lung cancer, L1 compression fx admitted with colon cancer, s/p R colectomy 02/08/17.   Clinical Impression  Pt admitted with above diagnosis. Pt currently with functional limitations due to the deficits listed below (see PT Problem List). Mod assist bed mobility. Pt ambulated 85' with RW, distance limited by pain/fatigue. ST-SNF recommended as pt doesn't have 24* assist at home.  Pt will benefit from skilled PT to increase their independence and safety with mobility to allow discharge to the venue listed below.       Follow Up Recommendations SNF;Supervision for mobility/OOB    Equipment Recommendations  None recommended by PT    Recommendations for Other Services       Precautions / Restrictions Precautions Precautions: Fall Precaution Comments: abdominal incision, no falls in past 1 year Restrictions Weight Bearing Restrictions: No      Mobility  Bed Mobility Overal bed mobility: Needs Assistance Bed Mobility: Sit to Supine       Sit to supine: Mod assist   General bed mobility comments: for BLEs into bed; verbally instructed pt in log roll for supine to sit (pt standing up in room with student nurses upon start of PT eval)  Transfers Overall transfer level: Needs assistance Equipment used: Rolling walker (2 wheeled) Transfers: Sit to/from Stand Sit to Stand: Min assist         General transfer comment: stand to sit, min A to control descent, VCs hand placement  Ambulation/Gait Ambulation/Gait assistance: Min guard Ambulation Distance (Feet): 55 Feet Assistive device: Rolling walker (2 wheeled) Gait Pattern/deviations: Step-through pattern;Decreased step length - right;Decreased step length - left   Gait velocity interpretation: Below normal speed for  age/gender General Gait Details: steady, distance limited by pain/fatigue  Stairs            Wheelchair Mobility    Modified Rankin (Stroke Patients Only)       Balance Overall balance assessment: Needs assistance   Sitting balance-Leahy Scale: Good       Standing balance-Leahy Scale: Fair                               Pertinent Vitals/Pain Pain Assessment: 0-10 Pain Score: 8  Pain Location: abdomen Pain Descriptors / Indicators: Sore Pain Intervention(s): Limited activity within patient's tolerance;Monitored during session;Patient requesting pain meds-RN notified    Home Living Family/patient expects to be discharged to:: Private residence Living Arrangements: Children Available Help at Discharge: Family;Available PRN/intermittently Type of Home: House Home Access: Stairs to enter   Entrance Stairs-Number of Steps: 4 Home Layout: One level Home Equipment: Grab bars - tub/shower;Walker - 4 wheels      Prior Function Level of Independence: Needs assistance   Gait / Transfers Assistance Needed: uses RW, assist for stairs  ADL's / Homemaking Assistance Needed: independent, stands in shower using grab bar  Comments: uses RW, goes between 2 daughters' homes, is alone a few hours per day, sit to stand was challenging PTA per daughter, no falls in past 1 year     Hand Dominance        Extremity/Trunk Assessment   Upper Extremity Assessment Upper Extremity Assessment: Overall WFL for tasks assessed    Lower Extremity Assessment Lower Extremity Assessment: Overall WFL for tasks assessed  Cervical / Trunk Assessment Cervical / Trunk Assessment: Normal  Communication      Cognition Arousal/Alertness: Awake/alert Behavior During Therapy: WFL for tasks assessed/performed Overall Cognitive Status: Within Functional Limits for tasks assessed                      General Comments      Exercises     Assessment/Plan    PT  Assessment Patient needs continued PT services  PT Problem List Decreased activity tolerance;Decreased balance;Pain;Decreased mobility       PT Treatment Interventions Gait training;Stair training;DME instruction;Functional mobility training;Therapeutic exercise;Therapeutic activities;Patient/family education    PT Goals (Current goals can be found in the Care Plan section)  Acute Rehab PT Goals Patient Stated Goal: ST-SNF then home with daughter PT Goal Formulation: With patient/family Time For Goal Achievement: 02/23/17 Potential to Achieve Goals: Good    Frequency Min 3X/week   Barriers to discharge        Co-evaluation               End of Session Equipment Utilized During Treatment: Gait belt Activity Tolerance: Patient limited by fatigue;Patient limited by pain Patient left: in bed;with call bell/phone within reach;with family/visitor present;with nursing/sitter in room Nurse Communication: Mobility status;Patient requests pain meds PT Visit Diagnosis: Other abnormalities of gait and mobility (R26.89);Difficulty in walking, not elsewhere classified (R26.2)         Time: 2258-3462 PT Time Calculation (min) (ACUTE ONLY): 23 min   Charges:   PT Evaluation $PT Eval Low Complexity: 1 Procedure PT Treatments $Gait Training: 8-22 mins   PT G Codes:         Philomena Doheny 02/09/2017, 11:46 AM [phone]

## 2017-02-09 NOTE — Progress Notes (Signed)
1 Day Post-Op lap R colectomy Subjective: c/o soreness on right side, no nausea  Objective: Vital signs in last 24 hours: Temp:  [97.5 F (36.4 C)-98 F (36.7 C)] 97.9 F (36.6 C) (03/15 0500) Pulse Rate:  [84-96] 96 (03/15 0500) Resp:  [12-21] 18 (03/15 0500) BP: (103-161)/(47-72) 137/69 (03/15 0500) SpO2:  [84 %-100 %] 99 % (03/15 0500)   Intake/Output from previous day: 03/14 0701 - 03/15 0700 In: 2931.3 [I.V.:2831.3; IV Piggyback:100] Out: 1035 [Urine:985; Blood:50] Intake/Output this shift: No intake/output data recorded.   General appearance: alert and cooperative GI: soft, appropriately tender  Incision: no significant drainage, no significant erythema  Lab Results:   Recent Labs  02/07/17 1544 02/09/17 0502  WBC 9.6 13.9*  HGB 9.5* 7.8*  HCT 31.9* 26.3*  PLT 440* 388   BMET  Recent Labs  02/07/17 1544 02/09/17 0502  NA 138 135  K 4.6 4.5  CL 105 104  CO2 27 23  GLUCOSE 107* 182*  BUN 18 13  CREATININE 0.97 0.91  CALCIUM 9.6 8.8*   PT/INR No results for input(s): LABPROT, INR in the last 72 hours. ABG No results for input(s): PHART, HCO3 in the last 72 hours.  Invalid input(s): PCO2, PO2  MEDS, Scheduled . acetaminophen  1,000 mg Oral Q6H  . alvimopan  12 mg Oral BID  . atorvastatin  40 mg Oral QPM  . buPROPion  100 mg Oral BID  . carvedilol  3.125 mg Oral BID WC  . chlorhexidine  15 mL Mouth Rinse BID  . enoxaparin (LOVENOX) injection  40 mg Subcutaneous Q24H  . escitalopram  10 mg Oral Daily  . fluticasone furoate-vilanterol  1 puff Inhalation Daily  . loratadine  10 mg Oral QHS  . LORazepam  1 mg Oral QHS  . mouth rinse  15 mL Mouth Rinse q12n4p  . pantoprazole  40 mg Oral Daily    Studies/Results: No results found.  Assessment: s/p Procedure(s): LAPAROSCOPIC PARTIAL COLECTOMY Patient Active Problem List   Diagnosis Date Noted  . Colon cancer ([place]) 02/08/2017  . Iron deficiency anemia   . Fundic gland polyps of stomach,  benign   . Benign neoplasm of ascending colon   . Benign neoplasm of transverse colon   . Benign neoplasm of descending colon   . Colonic mass   . Heme positive stool 12/22/2016  . Chronic anticoagulation 12/22/2016  . Deficiency anemia 12/06/2016  . Dehydration 12/06/2016  . Diarrhea 12/06/2016  . Rash and nonspecific skin eruption 06/21/2016  . Cough 06/21/2016  . Fracture of L1 vertebra ([place]) 10/19/2015  . Weakness   . Urinary tract infectious disease   . HCAP (healthcare-associated pneumonia) 09/15/2015  . Compression fracture 09/13/2015  . Fall at home 09/13/2015  . Atrial fibrillation ([place]) 09/13/2015  . History of lung cancer   . Mobility impaired   . Fatigue 08/18/2015  . Depression 08/18/2015  . Frequent stools 08/18/2015  . Tachypnea 06/22/2015  . SIRS (systemic inflammatory response syndrome) ([place]) 06/22/2015  . Lung cancer, upper lobe ([place]) 06/22/2015  . Anemia 06/22/2015  . Leukopenia due to antineoplastic chemotherapy ([place]) 06/22/2015  . Hypocalcemia 06/22/2015  . Conjunctivitis 06/06/2015  . UTI (urinary tract infection) 06/06/2015  . Adenocarcinoma of upper lobe of right lung 04/30/2015  . Sleep apnea 04/28/2015  . Mediastinal adenopathy 04/24/2015  . Severe obstructive sleep apnea 03/27/2015  . Lung mass 03/27/2015  . Folate deficiency 09/21/2014  . Paroxysmal a-fib ([place]) 05/27/2014  . DOE (dyspnea on exertion)  05/27/2014  . HTN (hypertension) 05/27/2014  . Dyslipidemia 05/27/2014  . Bradycardia 05/27/2014  . Hair loss 05/27/2014  . Breast cancer ([place]) 04/17/2014  . Prediabetes 04/17/2014  . CAD (coronary artery disease) 04/16/2014  . Diverticulitis 04/16/2014  . Hyperlipidemia 04/16/2014    Expected post op course  Plan: Advance diet to clears as tolerated Decrease MIV Ambulate, PT eval   LOS: 1 day     .Rosario Adie, [facility], [place]   02/09/2017 7:27 AM

## 2017-02-10 LAB — CBC
HCT: 25.8 % — ABNORMAL LOW (ref 36.0–46.0)
Hemoglobin: 7.8 g/dL — ABNORMAL LOW (ref 12.0–15.0)
MCH: 23.1 pg — AB (ref 26.0–34.0)
MCHC: 30.2 g/dL (ref 30.0–36.0)
MCV: 76.6 fL — AB (ref 78.0–100.0)
PLATELETS: 399 10*3/uL (ref 150–400)
RBC: 3.37 MIL/uL — ABNORMAL LOW (ref 3.87–5.11)
RDW: 18.5 % — ABNORMAL HIGH (ref 11.5–15.5)
WBC: 17 10*3/uL — AB (ref 4.0–10.5)

## 2017-02-10 LAB — BASIC METABOLIC PANEL
ANION GAP: 6 (ref 5–15)
BUN: 12 mg/dL (ref 6–20)
CALCIUM: 8.8 mg/dL — AB (ref 8.9–10.3)
CO2: 28 mmol/L (ref 22–32)
Chloride: 105 mmol/L (ref 101–111)
Creatinine, Ser: 0.78 mg/dL (ref 0.44–1.00)
GFR calc Af Amer: >60 mL/min (ref >60)
GLUCOSE: 150 mg/dL — AB (ref 65–99)
POTASSIUM: 4.6 mmol/L (ref 3.5–5.1)
SODIUM: 139 mmol/L (ref 135–145)

## 2017-02-10 MED ORDER — METOCLOPRAMIDE HCL 5 MG/ML IJ SOLN
5.0000 mg | Freq: Four times a day (QID) | INTRAMUSCULAR | Status: DC | PRN
Start: 2017-02-10 — End: 2017-02-13
  Administered 2017-02-10: 5 mg via INTRAVENOUS
  Administered 2017-02-11 (×2): 10 mg via INTRAVENOUS
  Filled 2017-02-10 (×3): qty 2

## 2017-02-10 MED ORDER — ONDANSETRON HCL 4 MG/2ML IJ SOLN
4.0000 mg | Freq: Four times a day (QID) | INTRAMUSCULAR | Status: DC | PRN
Start: 2017-02-10 — End: 2017-02-12

## 2017-02-10 MED ORDER — PROCHLORPERAZINE EDISYLATE 5 MG/ML IJ SOLN
5.0000 mg | INTRAMUSCULAR | Status: DC | PRN
  Administered 2017-02-11: 10 mg via INTRAVENOUS
  Filled 2017-02-10: qty 2

## 2017-02-10 MED ORDER — ONDANSETRON HCL 40 MG/20ML IJ SOLN
8.0000 mg | Freq: Four times a day (QID) | INTRAMUSCULAR | Status: DC | PRN
  Filled 2017-02-10: qty 4

## 2017-02-10 NOTE — Progress Notes (Signed)
Paged MD to make aware of patient condition. Pt is pale and weak, spitting up thin clear red sputum, pt did state she ate Jello, O2 was in the mid 80s when on room air. MD aware pt placed on 2L O2, sats returned to normal. MD came to floor to speak with nurse. Orders were placed by MD. New orders included prn nausea medication, and change of diet from clears to NPO x ice. Will cont to monitor and treat.

## 2017-02-10 NOTE — Discharge Instructions (Signed)

## 2017-02-10 NOTE — Clinical Social Work Note (Signed)
Clinical Social Work Assessment  Patient Details  Name: Pamela Mcguire MRN: [id number] Date of Birth: [birth year]  Date of referral:  02/10/17               Reason for consult:  Facility Placement                Permission sought to share information with:  Chartered certified accountant granted to share information::  Yes, Verbal Permission Granted  Name::        Agency::     Relationship::     Contact Information:     Housing/Transportation Living arrangements for the past 2 months:  Single Family Home Source of Information:  Patient Patient Interpreter Needed:  None Criminal Activity/Legal Involvement Pertinent to Current Situation/Hospitalization:  No - Comment as needed Significant Relationships:  Adult Children Lives with:  Self Do you feel safe going back to the place where you live?  Yes Need for family participation in patient care:  Yes (Comment)  Care giving concerns:  CSW reviewed PT evaluation recommending SNF at discharge.    Social Worker assessment / plan:  CSW spoke with patient re: discharge planning - patient states that she hadn't given much though to discharge and deferred decisions to her daughter Pamela Mcguire. CSW called & spoke with Pamela Mcguire who states that she had been to [facility] in the past and would prefer that she go there. CSW awaiting call back from [facility] at [facility] re: bed offer/availability. CSW provided daughter with other bed offers.   Employment status:  Retired Nurse, adult PT Recommendations:  [place] / Referral to community resources:  [place]  Patient/Family's Response to care:    Patient/Family's Understanding of and Emotional Response to Diagnosis, Current Treatment, and Prognosis:    Emotional Assessment Appearance:  Appears stated age Attitude/Demeanor/Rapport:    Affect (typically observed):    Orientation:  Oriented to Self Alcohol / Substance  use:    Psych involvement (Current and /or in the community):     Discharge Needs  Concerns to be addressed:    Readmission within the last 30 days:    Current discharge risk:    Barriers to Discharge:      Standley Brooking, LCSW 02/10/2017, 2:51 PM

## 2017-02-10 NOTE — Progress Notes (Addendum)
2 Days Post-Op lap R colectomy Subjective: c/o soreness, had nausea and vomiting once overnight  Objective: Vital signs in last 24 hours: Temp:  [97.5 F (36.4 C)-98.4 F (36.9 C)] 98.1 F (36.7 C) (03/16 0506) Pulse Rate:  [98-114] 100 (03/16 0506) Resp:  [16-18] 16 (03/16 0506) BP: (129-142)/(46-77) 137/77 (03/16 0506) SpO2:  [92 %-99 %] 95 % (03/16 0506)   Intake/Output from previous day: 03/15 0701 - 03/16 0700 In: 2310 [P.O.:1060; I.V.:1250] Out: 2530 [Urine:2530] Intake/Output this shift: Total I/O In: 240 [P.O.:240] Out: 200 [Urine:200]   General appearance: alert and cooperative GI: soft, appropriately tender  Incision: no significant drainage, no significant erythema  Lab Results:   Recent Labs  02/09/17 0502 02/10/17 0433  WBC 13.9* 17.0*  HGB 7.8* 7.8*  HCT 26.3* 25.8*  PLT 388 399   BMET  Recent Labs  02/09/17 0502 02/10/17 0433  NA 135 139  K 4.5 4.6  CL 104 105  CO2 23 28  GLUCOSE 182* 150*  BUN 13 12  CREATININE 0.91 0.78  CALCIUM 8.8* 8.8*   PT/INR No results for input(s): LABPROT, INR in the last 72 hours. ABG No results for input(s): PHART, HCO3 in the last 72 hours.  Invalid input(s): PCO2, PO2  MEDS, Scheduled . acetaminophen  1,000 mg Oral Q6H  . alvimopan  12 mg Oral BID  . atorvastatin  40 mg Oral QPM  . buPROPion  100 mg Oral BID  . carvedilol  3.125 mg Oral BID WC  . chlorhexidine  15 mL Mouth Rinse BID  . enoxaparin (LOVENOX) injection  40 mg Subcutaneous Q24H  . escitalopram  10 mg Oral Daily  . fluticasone furoate-vilanterol  1 puff Inhalation Daily  . loratadine  10 mg Oral QHS  . LORazepam  1 mg Oral QHS  . mouth rinse  15 mL Mouth Rinse q12n4p  . pantoprazole  40 mg Oral Daily    Studies/Results: No results found.  Assessment: s/p Procedure(s): LAPAROSCOPIC PARTIAL COLECTOMY Patient Active Problem List   Diagnosis Date Noted  . Colon cancer ([place]) 02/08/2017  . Iron deficiency anemia   . Fundic  gland polyps of stomach, benign   . Benign neoplasm of ascending colon   . Benign neoplasm of transverse colon   . Benign neoplasm of descending colon   . Colonic mass   . Heme positive stool 12/22/2016  . Chronic anticoagulation 12/22/2016  . Deficiency anemia 12/06/2016  . Dehydration 12/06/2016  . Diarrhea 12/06/2016  . Rash and nonspecific skin eruption 06/21/2016  . Cough 06/21/2016  . Fracture of L1 vertebra ([place]) 10/19/2015  . Weakness   . Urinary tract infectious disease   . HCAP (healthcare-associated pneumonia) 09/15/2015  . Compression fracture 09/13/2015  . Fall at home 09/13/2015  . Atrial fibrillation ([place]) 09/13/2015  . History of lung cancer   . Mobility impaired   . Fatigue 08/18/2015  . Depression 08/18/2015  . Frequent stools 08/18/2015  . Tachypnea 06/22/2015  . SIRS (systemic inflammatory response syndrome) ([place]) 06/22/2015  . Lung cancer, upper lobe ([place]) 06/22/2015  . Anemia 06/22/2015  . Leukopenia due to antineoplastic chemotherapy ([place]) 06/22/2015  . Hypocalcemia 06/22/2015  . Conjunctivitis 06/06/2015  . UTI (urinary tract infection) 06/06/2015  . Adenocarcinoma of upper lobe of right lung 04/30/2015  . Sleep apnea 04/28/2015  . Mediastinal adenopathy 04/24/2015  . Severe obstructive sleep apnea 03/27/2015  . Lung mass 03/27/2015  . Folate deficiency 09/21/2014  . Paroxysmal a-fib ([place]) 05/27/2014  . DOE (  dyspnea on exertion) 05/27/2014  . HTN (hypertension) 05/27/2014  . Dyslipidemia 05/27/2014  . Bradycardia 05/27/2014  . Hair loss 05/27/2014  . Breast cancer ([place]) 04/17/2014  . Prediabetes 04/17/2014  . CAD (coronary artery disease) 04/16/2014  . Diverticulitis 04/16/2014  . Hyperlipidemia 04/16/2014    Expected post op course  Plan: Advance diet to clears as tolerated Cont MIV Ambulate Dispo: pt will need SNF post operatively   LOS: 2 days     .Rosario Adie, [facility],  [place]   02/10/2017 10:48 AM

## 2017-02-10 NOTE — Clinical Social Work Placement (Signed)
   CLINICAL SOCIAL WORK PLACEMENT  NOTE  Date:  02/10/2017  Patient Details  Name: Pamela Mcguire MRN: [id number] Date of Birth: 11/21/1935  Clinical Social Work is seeking post-discharge placement for this patient at the [place] level of care (*CSW will initial, date and re-position this form in  chart as items are completed):  Yes   Patient/family provided with [facility] Work Department's list of facilities offering this level of care within the geographic area requested by the patient (or if unable, by the patient's family).  Yes   Patient/family informed of their freedom to choose among providers that offer the needed level of care, that participate in Medicare, Medicaid or managed care program needed by the patient, have an available bed and are willing to accept the patient.  Yes   Patient/family informed of [facility]'s ownership interest in [facility] and [facility], as well as of the fact that they are under no obligation to receive care at these facilities.  PASRR submitted to EDS on       PASRR number received on       Existing PASRR number confirmed on 02/10/17     FL2 transmitted to all facilities in geographic area requested by pt/family on 02/10/17     FL2 transmitted to all facilities within larger geographic area on       Patient informed that his/her managed care company has contracts with or will negotiate with certain facilities, including the following:        Yes   Patient/family informed of bed offers received.  Patient chooses bed at       Physician recommends and patient chooses bed at      Patient to be transferred to   on  .  Patient to be transferred to facility by       Patient family notified on   of transfer.  Name of family member notified:        PHYSICIAN       Additional Comment:    _______________________________________________ Standley Brooking, LCSW 02/10/2017, 3:29 PM

## 2017-02-10 NOTE — Progress Notes (Signed)
Physical Therapy Treatment Patient Details Name: Pamela Mcguire MRN: [id number] DOB: [birth year] Today's Date: 02/10/2017    History of Present Illness 82 y.o. female with h/o CAD, afib, HTN, GERD, lung cancer, L1 compression fx admitted with colon cancer, s/p R colectomy 02/08/17.     PT Comments    Pt requested to use bathroom first and unable to tolerate ambulating in hallway due to dizziness after using bathroom.  RN into room end of session.  Continue to recommend SNF upon d/c.  Follow Up Recommendations  SNF;Supervision for mobility/OOB     Equipment Recommendations  None recommended by PT    Recommendations for Other Services       Precautions / Restrictions Precautions Precautions: Fall Precaution Comments: abdominal incision    Mobility  Bed Mobility Overal bed mobility: Needs Assistance Bed Mobility: Supine to Sit     Supine to sit: Min assist     General bed mobility comments: slight assist for trunk upright  Transfers Overall transfer level: Needs assistance Equipment used: Rolling walker (2 wheeled) Transfers: Sit to/from Stand Sit to Stand: Min assist         General transfer comment: verbal cues for hand placement and safety  Ambulation/Gait Ambulation/Gait assistance: Min guard Ambulation Distance (Feet): 15 Feet Assistive device: Rolling walker (2 wheeled) Gait Pattern/deviations: Step-through pattern;Decreased stride length     General Gait Details: steady with RW, distance limited by dizziness after using bathroom, only able to tolerate ambulating in room (RN notified, reports pt received IV morphine prior to PT arrival)   Financial trader Rank[place] (Stroke Patients Only)       Balance                                    Cognition Arousal/Alertness: Awake/alert Behavior During Therapy: WFL for tasks assessed/performed Overall Cognitive Status: Within Functional Limits for tasks  assessed                      Exercises      General Comments        Pertinent Vitals/Pain Pain Assessment: 0-10 Pain Score: 7  Pain Location: abdomen Pain Descriptors / Indicators: Sore Pain Intervention(s): Premedicated before session;Repositioned;Monitored during session    Home Living                      Prior Function            PT Goals (current goals can now be found in the care plan section) Progress towards PT goals: Progressing toward goals    Frequency    Min 3X/week      PT Plan Current plan remains appropriate    Co-evaluation             End of Session   Activity Tolerance: Patient limited by fatigue;Patient limited by pain Patient left: with call bell/phone within reach;with nursing/sitter in room;in chair;with chair alarm set Nurse Communication: Mobility status PT Visit Diagnosis: Difficulty in walking, not elsewhere classified (R26.2)     Time: 7829-5621 PT Time Calculation (min) (ACUTE ONLY): 15 min  Charges:  $Gait Training: 8-22 mins                    G Codes:       Brynda Heick,KATHrine E 02/10/2017, 12:20  PM Carmelia Bake, PT, DPT 02/10/2017 Pager: [phone]

## 2017-02-11 LAB — CBC
HCT: 26.3 % — ABNORMAL LOW (ref 36.0–46.0)
Hemoglobin: 8 g/dL — ABNORMAL LOW (ref 12.0–15.0)
MCH: 23.2 pg — ABNORMAL LOW (ref 26.0–34.0)
MCHC: 30.4 g/dL (ref 30.0–36.0)
MCV: 76.2 fL — ABNORMAL LOW (ref 78.0–100.0)
Platelets: 386 10*3/uL (ref 150–400)
RBC: 3.45 MIL/uL — ABNORMAL LOW (ref 3.87–5.11)
RDW: 18.4 % — ABNORMAL HIGH (ref 11.5–15.5)
WBC: 12.5 10*3/uL — ABNORMAL HIGH (ref 4.0–10.5)

## 2017-02-11 LAB — BASIC METABOLIC PANEL
Anion gap: 9 (ref 5–15)
BUN: 10 mg/dL (ref 6–20)
CO2: 24 mmol/L (ref 22–32)
CREATININE: 0.85 mg/dL (ref 0.44–1.00)
Calcium: 8.5 mg/dL — ABNORMAL LOW (ref 8.9–10.3)
Chloride: 101 mmol/L (ref 101–111)
GLUCOSE: 133 mg/dL — AB (ref 65–99)
Potassium: 3.7 mmol/L (ref 3.5–5.1)
SODIUM: 134 mmol/L — AB (ref 135–145)

## 2017-02-11 LAB — TYPE AND SCREEN
ABO/RH(D): B NEG
Antibody Screen: POSITIVE
DAT, IGG: NEGATIVE
DONOR AG TYPE: NEGATIVE
UNIT DIVISION: 0

## 2017-02-11 LAB — BPAM RBC
Blood Product Expiration Date: 201804112359
UNIT TYPE AND RH: 9500

## 2017-02-11 NOTE — Progress Notes (Signed)
3 Days Post-Op  Subjective: Reports feeling better this morning Denies SOB Reports minimal nausea  Objective: Vital signs in last 24 hours: Temp:  [98 F (36.7 C)-98.3 F (36.8 C)] 98 F (36.7 C) (03/17 0614) Pulse Rate:  [101-103] 103 (03/17 0614) Resp:  [15-18] 15 (03/17 0614) BP: (119-199)/(63-85) 119/63 (03/17 0614) SpO2:  [95 %-96 %] 96 % (03/17 0614) Last BM Date: 02/10/17  Intake/Output from previous day: 03/16 0701 - 03/17 0700 In: 1450 [P.O.:300; I.V.:1150] Out: 400 [Urine:400] Intake/Output this shift: No intake/output data recorded.  Exam: Awake, comfortable in appearance Lungs clear CV mildly tachy Abdomen soft, non-distended  Lab Results:   Recent Labs  02/09/17 0502 02/10/17 0433  WBC 13.9* 17.0*  HGB 7.8* 7.8*  HCT 26.3* 25.8*  PLT 388 399   BMET  Recent Labs  02/09/17 0502 02/10/17 0433  NA 135 139  K 4.5 4.6  CL 104 105  CO2 23 28  GLUCOSE 182* 150*  BUN 13 12  CREATININE 0.91 0.78  CALCIUM 8.8* 8.8*   PT/INR No results for input(s): LABPROT, INR in the last 72 hours. ABG No results for input(s): PHART, HCO3 in the last 72 hours.  Invalid input(s): PCO2, PO2  Studies/Results: No results found.  Anti-infectives: Anti-infectives    Start     Dose/Rate Route Frequency Ordered Stop   02/08/17 0650  cefoTEtan (CEFOTAN) 2 g in dextrose 5 % 50 mL IVPB     2 g 100 mL/hr over 30 Minutes Intravenous On call to O.R. 02/08/17 0650 02/08/17 0839      Assessment/Plan: s/p Procedure(s): LAPAROSCOPIC PARTIAL COLECTOMY (N/Mcguire)  Check labs Full liquid diet today  LOS: 3 days    Pamela Mcguire 02/11/2017

## 2017-02-12 ENCOUNTER — Encounter (HOSPITAL_COMMUNITY): Payer: Self-pay | Admitting: General Surgery

## 2017-02-12 MED ORDER — HYDROMORPHONE HCL 1 MG/ML IJ SOLN: 0.5000 mg | INTRAMUSCULAR | Status: DC | PRN

## 2017-02-12 MED ORDER — METHOCARBAMOL 500 MG PO TABS: 1000.0000 mg | ORAL_TABLET | Freq: Four times a day (QID) | ORAL | Status: DC | PRN

## 2017-02-12 MED ORDER — PHENOL 1.4 % MT LIQD
2.0000 | OROMUCOSAL | Status: DC | PRN
  Filled 2017-02-12: qty 177

## 2017-02-12 MED ORDER — ENALAPRILAT 1.25 MG/ML IV SOLN
0.6250 mg | Freq: Four times a day (QID) | INTRAVENOUS | Status: DC | PRN
  Filled 2017-02-12: qty 1

## 2017-02-12 MED ORDER — SODIUM CHLORIDE 0.9% FLUSH
3.0000 mL | Freq: Two times a day (BID) | INTRAVENOUS | Status: DC
  Administered 2017-02-12 – 2017-02-15 (×4): 3 mL via INTRAVENOUS

## 2017-02-12 MED ORDER — HYDRALAZINE HCL 20 MG/ML IJ SOLN: 5.0000 mg | Freq: Four times a day (QID) | INTRAMUSCULAR | Status: DC | PRN

## 2017-02-12 MED ORDER — ACETAMINOPHEN 500 MG PO TABS
1000.0000 mg | ORAL_TABLET | Freq: Three times a day (TID) | ORAL | Status: DC
  Administered 2017-02-12 – 2017-02-15 (×8): 1000 mg via ORAL
  Filled 2017-02-12 (×6): qty 2

## 2017-02-12 MED ORDER — ONDANSETRON HCL 4 MG PO TABS
4.0000 mg | ORAL_TABLET | Freq: Three times a day (TID) | ORAL | Status: DC
  Administered 2017-02-12 – 2017-02-15 (×11): 4 mg via ORAL
  Filled 2017-02-12 (×10): qty 1

## 2017-02-12 MED ORDER — ACETAMINOPHEN 500 MG PO TABS
500.0000 mg | ORAL_TABLET | Freq: Every day | ORAL | Status: DC
  Administered 2017-02-12: 500 mg via ORAL
  Filled 2017-02-12 (×2): qty 1

## 2017-02-12 MED ORDER — LIP MEDEX EX OINT
1.0000 "application " | TOPICAL_OINTMENT | Freq: Two times a day (BID) | CUTANEOUS | Status: DC
  Administered 2017-02-14 – 2017-02-15 (×3): 1 via TOPICAL
  Filled 2017-02-12: qty 7

## 2017-02-12 MED ORDER — MELATONIN 10 MG PO CAPS: 10.0000 mg | ORAL_CAPSULE | Freq: Every day | ORAL | Status: DC

## 2017-02-12 MED ORDER — METOPROLOL TARTRATE 5 MG/5ML IV SOLN: 5.0000 mg | Freq: Four times a day (QID) | INTRAVENOUS | Status: DC | PRN

## 2017-02-12 MED ORDER — DIPHENHYDRAMINE-APAP (SLEEP) 25-500 MG PO TABS: 1.0000 | ORAL_TABLET | Freq: Every day | ORAL | Status: DC

## 2017-02-12 MED ORDER — METHOCARBAMOL 1000 MG/10ML IJ SOLN
1000.0000 mg | Freq: Four times a day (QID) | INTRAVENOUS | Status: DC | PRN
  Filled 2017-02-12: qty 10

## 2017-02-12 MED ORDER — OXYCODONE HCL 5 MG PO TABS
5.0000 mg | ORAL_TABLET | ORAL | Status: DC | PRN
  Administered 2017-02-12 (×2): 5 mg via ORAL
  Administered 2017-02-14 (×2): 10 mg via ORAL
  Filled 2017-02-12 (×2): qty 1
  Filled 2017-02-12: qty 2
  Filled 2017-02-12: qty 1
  Filled 2017-02-12: qty 2

## 2017-02-12 MED ORDER — SACCHAROMYCES BOULARDII 250 MG PO CAPS
250.0000 mg | ORAL_CAPSULE | Freq: Two times a day (BID) | ORAL | Status: DC
  Administered 2017-02-12 – 2017-02-15 (×6): 250 mg via ORAL
  Filled 2017-02-12 (×6): qty 1

## 2017-02-12 MED ORDER — BOOST / RESOURCE BREEZE PO LIQD
1.0000 | Freq: Three times a day (TID) | ORAL | Status: DC
  Administered 2017-02-12 – 2017-02-15 (×5): 1 via ORAL

## 2017-02-12 MED ORDER — DIPHENHYDRAMINE HCL 25 MG PO CAPS
25.0000 mg | ORAL_CAPSULE | Freq: Every day | ORAL | Status: DC
  Administered 2017-02-12 – 2017-02-14 (×3): 25 mg via ORAL
  Filled 2017-02-12 (×3): qty 1

## 2017-02-12 MED ORDER — SODIUM CHLORIDE 0.9% FLUSH: 3.0000 mL | INTRAVENOUS | Status: DC | PRN

## 2017-02-12 MED ORDER — MENTHOL 3 MG MT LOZG: 1.0000 | LOZENGE | OROMUCOSAL | Status: DC | PRN

## 2017-02-12 MED ORDER — LACTATED RINGERS IV BOLUS (SEPSIS): 1000.0000 mL | Freq: Three times a day (TID) | INTRAVENOUS | Status: DC | PRN

## 2017-02-12 MED ORDER — SODIUM CHLORIDE 0.9 % IV SOLN: 250.0000 mL | INTRAVENOUS | Status: DC | PRN

## 2017-02-12 MED ORDER — MAGIC MOUTHWASH
15.0000 mL | Freq: Four times a day (QID) | ORAL | Status: DC | PRN
  Filled 2017-02-12: qty 15

## 2017-02-12 NOTE — Progress Notes (Signed)
PHARMACIST - PHYSICIAN ORDER COMMUNICATION  CONCERNING: P&T Medication Policy on Herbal Medications  DESCRIPTION:  This patient's order for:  melatonin  has been noted.  This product(s) is classified as an "herbal" or natural product. Due to a lack of definitive safety studies or FDA approval, nonstandard manufacturing practices, plus the potential risk of unknown drug-drug interactions while on inpatient medications, the Pharmacy and Therapeutics Committee does not permit the use of "herbal" or natural products of this type within [facility].   ACTION TAKEN: The pharmacy department is unable to verify this order at this time and the order has been discontinued. Please reevaluate patient's clinical condition at discharge and address if the herbal or natural product(s) should be resumed at that time.  Royetta Asal, PharmD, BCPS Pager [phone] 02/12/2017 10:48 AM

## 2017-02-12 NOTE — Progress Notes (Addendum)
Pt having multiple loose stools. Entereg discontinued per protocol/order.                                       Marland Kitchen

## 2017-02-12 NOTE — Progress Notes (Signed)
Patient experiences frequent loose stools.  Daughter at beside recommended lomotil.  Dr. Ninfa Linden notified.  Dr. Ninfa Linden ordered to obtain stool for C-diff.  Dr. Ninfa Linden explained that patient's status would become compromised if given lomotil.  Patient was instructed of her plan of care.

## 2017-02-12 NOTE — Progress Notes (Signed)
[facility]  [address] [place], [place] [address] Phone: [phone] FAX: [phone]   Pamela Mcguire [id number] 15-Apr-1935    Problem List:   Principal Problem:   Cancer of ascending colon s/p lap colectomy 02/08/2017 Active Problems:   Paroxysmal a-fib (HCC)   DOE (dyspnea on exertion)   HTN (hypertension)   Severe obstructive sleep apnea   Adenocarcinoma of upper lobe of right lung   Fatigue   Depression   CAD (coronary artery disease)   Chronic anticoagulation   Iron deficiency anemia   4 Days Post-Op  02/08/2017  POST-OPERATIVE DIAGNOSIS:  ascending colon cancer  PROCEDURE:   LAPAROSCOPIC PARTIAL COLECTOMY    Surgeon(s): Leighton Ruff, MD  Assessment  FAir  Plan:  -switch pain meds -nausea control -try fulls -T3N0 (0/18 LN) - path given to pt.  Encouraging Stage II only -CAD/HTN - Coreg -Afib.  Rate control.  LMWH.  Not full anticoag yet until tol PO -anxiolysis -control depression -VTE prophylaxis- SCDs, etc -mobilize as tolerated to help recovery - PT/OT evals.  Prob SNF  Adin Hector, M.D., F.A.C.S. Gastrointestinal and Minimally Invasive Surgery [facility] [address], [place], [place] [address] [phone] Main / Paging   02/12/2017  CARE TEAM:  PCP: Mauricio Po, [place]  Outpatient Care Team: Patient Care Team: Golden Circle, FNP as PCP - General (Family Medicine)  Inpatient Treatment Team: Treatment Team: Attending Provider: Leighton Ruff, MD; Technician: Sueanne Margarita, NT; Technician: Coralie Carpen, NT; Technician: Leda Quail, NT; Technician: Abbe Amsterdam, NT; Registered Nurse: Caryn Bee, RN; Technician: Etheleen Sia, NT  Subjective:  "I feel crappy" Mild nausea Sore Tol liquids Loose BMs "You mean I can't go home today?"  Objective:  Vital signs:  Vitals:   02/11/17 1054 02/11/17 1422 02/11/17  2348 02/12/17 0856  BP:  (!) 124/55 (!) 157/77   Pulse:  99 (!) 111   Resp:   18   Temp:  97.7 F (36.5 C) 98 F (36.7 C)   TempSrc:  Oral Oral   SpO2: 98% 100% 98% 100%  Weight:      Height:        Last BM Date: 02/11/17  Intake/Output   Yesterday:  03/17 0701 - 03/18 0700 In: 1560 [P.O.:360; I.V.:1200] Out: 800 [Urine:800] This shift:  Total I/O In: 100 [I.V.:100] Out: -   Bowel function:  Flatus: YES  BM:  YES  Drain: (No drain)   Physical Exam:  General: Pt awake/alert/oriented x4 in No acute distress.  Tired not toxic Eyes: PERRL, normal EOM.  Sclera clear.  No icterus Neuro: CN II-XII intact w/o focal sensory/motor deficits. Lymph: No head/neck/groin lymphadenopathy Psych:  No delerium/psychosis/paranoia HENT: Normocephalic, Mucus membranes moist.  No thrush Neck: Supple, No tracheal deviation Chest: No chest wall pain w good excursion CV:  Pulses intact.  Irregular rhythm.  Not tachy MS: Normal AROM mjr joints.  No obvious deformity Abdomen: Soft.  Nondistended.  Mildly tender at incisions only  Dressings c/d/I.  No evidence of peritonitis.  No incarcerated hernias. Ext:  SCDs BLE.  No mjr edema.  No cyanosis Skin: No petechiae / purpura  Results:   Diagnosis Colon, segmental resection for tumor, right - INVASIVE ADENOCARCINOMA, WELL DIFFERENTIATED, SPANNING 4 CM. - TUMOR INVADES PERICOLONIC TISSUE. - RESECTION MARGINS ARE NEGATIVE. - EIGHTEEN OF EIGHTEEN LYMPH NODES NEGATIVE FOR CARCINOMA (0/18). - TUBULAR ADENOMA (X3). - BENIGN APPENDIX. - SEE  ONCOLOGY TABLE. Microscopic Comment COLON AND RECTUM (INCLUDING TRANS-ANAL RESECTION): Specimen: Right colon with appendix. Procedure: Segmental resection. Tumor site: Proximal ascending. Specimen integrity: Intact. Macroscopic intactness of mesorectum: N/A. Macroscopic tumor perforation: Not identified. Invasive tumor: Maximum size: 4 cm. Histologic type(s): Invasive adenocarcinoma. Histologic  grade and differentiation: G1: well differentiated/low grade Type of polyp in which invasive carcinoma arose: Tubular adenoma. Microscopic extension of invasive tumor: Invades through muscularis propria into pericolonic tissue. Lymph-Vascular invasion: Indeterminant. Peri-neural invasion: Not identified. Tumor deposit(s) (discontinuous extramural extension): Not identified. Resection margins: Proximal margin: Negative. Distal margin: Negative. Circumferential (radial) (posterior ascending, posterior descending; lateral 1 of 3 FINAL for Pamela Mcguire, Pamela Mcguire ([id number]) Microscopic Comment(continued) and posterior mid-rectum; and entire lower 1/3 rectum): Negative. Mesenteric margin (sigmoid and transverse): N/A. Distance closest margin (if all above margins negative): 2 cm (radial). Treatment effect (neo-adjuvant therapy): N/A. Additional polyp(s): Tubular adenoma (X3). Non-neoplastic findings: Benign appendix. Lymph nodes: number examined 18; number positive: 0 Pathologic Staging: pT3, pN0, pMX Ancillary studies: MSI and MMR will be performed. Vicente Males MD Pathologist, Electronic Signature  Labs: Results for orders placed or performed during the hospital encounter of 02/08/17 (from the past 48 hour(s))  Basic metabolic panel     Status: Abnormal   Collection Time: 02/11/17  8:12 AM  Result Value Ref Range   Sodium 134 (L) 135 - 145 mmol/L   Potassium 3.7 3.5 - 5.1 mmol/L    Comment: DELTA CHECK NOTED   Chloride 101 101 - 111 mmol/L   CO2 24 22 - 32 mmol/L   Glucose, Bld 133 (H) 65 - 99 mg/dL   BUN 10 6 - 20 mg/dL   Creatinine, Ser 0.85 0.44 - 1.00 mg/dL   Calcium 8.5 (L) 8.9 - 10.3 mg/dL   GFR calc non Af Amer >60 >60 mL/min   GFR calc Af Amer >60 >60 mL/min    Comment: (NOTE) The eGFR has been calculated using the CKD EPI equation. This calculation has not been validated in all clinical situations. eGFR's persistently <60 mL/min signify possible Chronic Kidney Disease.     Anion gap 9 5 - 15  CBC     Status: Abnormal   Collection Time: 02/11/17  8:12 AM  Result Value Ref Range   WBC 12.5 (H) 4.0 - 10.5 K/uL   RBC 3.45 (L) 3.87 - 5.11 MIL/uL   Hemoglobin 8.0 (L) 12.0 - 15.0 g/dL   HCT 26.3 (L) 36.0 - 46.0 %   MCV 76.2 (L) 78.0 - 100.0 fL   MCH 23.2 (L) 26.0 - 34.0 pg   MCHC 30.4 30.0 - 36.0 g/dL   RDW 18.4 (H) 11.5 - 15.5 %   Platelets 386 150 - 400 K/uL    Imaging / Studies: No results found.  Medications / Allergies: per chart  Antibiotics: Anti-infectives    Start     Dose/Rate Route Frequency Ordered Stop   02/08/17 0650  cefoTEtan (CEFOTAN) 2 g in dextrose 5 % 50 mL IVPB     2 g 100 mL/hr over 30 Minutes Intravenous On call to O.R. 02/08/17 3557 02/08/17 0839        Note: Portions of this report may have been transcribed using voice recognition software. Every effort was made to ensure accuracy; however, inadvertent computerized transcription errors may be present.   Any transcriptional errors that result from this process are unintentional.     Adin Hector, M.D., F.A.C.S. Gastrointestinal and Minimally Invasive Surgery [facility] [address]  [place], [place] [address] [phone] Main / Paging   02/12/2017

## 2017-02-13 ENCOUNTER — Encounter (HOSPITAL_COMMUNITY): Payer: Self-pay | Admitting: General Practice

## 2017-02-13 LAB — BASIC METABOLIC PANEL
Anion gap: 8 (ref 5–15)
BUN: 7 mg/dL (ref 6–20)
CALCIUM: 9 mg/dL (ref 8.9–10.3)
CHLORIDE: 102 mmol/L (ref 101–111)
CO2: 29 mmol/L (ref 22–32)
CREATININE: 0.78 mg/dL (ref 0.44–1.00)
GFR calc Af Amer: >60 mL/min (ref >60)
GFR calc non Af Amer: >60 mL/min (ref >60)
GLUCOSE: 98 mg/dL (ref 65–99)
Potassium: 3.4 mmol/L — ABNORMAL LOW (ref 3.5–5.1)
Sodium: 139 mmol/L (ref 135–145)

## 2017-02-13 LAB — CBC
HCT: 22.1 % — ABNORMAL LOW (ref 36.0–46.0)
Hemoglobin: 6.7 g/dL — CL (ref 12.0–15.0)
MCH: 23.1 pg — AB (ref 26.0–34.0)
MCHC: 30.3 g/dL (ref 30.0–36.0)
MCV: 76.2 fL — AB (ref 78.0–100.0)
PLATELETS: 366 10*3/uL (ref 150–400)
RBC: 2.9 MIL/uL — ABNORMAL LOW (ref 3.87–5.11)
RDW: 18.4 % — ABNORMAL HIGH (ref 11.5–15.5)
WBC: 6.6 10*3/uL (ref 4.0–10.5)

## 2017-02-13 LAB — PREPARE RBC (CROSSMATCH)

## 2017-02-13 LAB — MAGNESIUM: MAGNESIUM: 1.5 mg/dL — AB (ref 1.7–2.4)

## 2017-02-13 MED ORDER — SODIUM CHLORIDE 0.9 % IV SOLN
Freq: Once | INTRAVENOUS | Status: AC
  Administered 2017-02-13: 08:00:00 via INTRAVENOUS

## 2017-02-13 MED ORDER — FUROSEMIDE 10 MG/ML IJ SOLN
20.0000 mg | Freq: Once | INTRAMUSCULAR | Status: AC
  Administered 2017-02-13: 20 mg via INTRAVENOUS
  Filled 2017-02-13: qty 2

## 2017-02-13 MED ORDER — SODIUM CHLORIDE 0.9 % IV SOLN
Freq: Once | INTRAVENOUS | Status: AC
  Administered 2017-02-13: 14:00:00 via INTRAVENOUS

## 2017-02-13 NOTE — Progress Notes (Signed)
5 Days Post-Op lap R colectomy Subjective: c/o soreness, tolerating a diet and having bowel function, pt states she's urinating several times a day  Objective: Vital signs in last 24 hours: Temp:  [98.1 F (36.7 C)-98.5 F (36.9 C)] 98.4 F (36.9 C) (03/19 0456) Pulse Rate:  [92-97] 92 (03/19 0456) Resp:  [18-20] 18 (03/19 0456) BP: (116-136)/(42-64) 136/64 (03/19 0456) SpO2:  [91 %-100 %] 91 % (03/19 0631)   Intake/Output from previous day: 03/18 0701 - 03/19 0700 In: 1060 [P.O.:960; I.V.:100] Out: 100 [Urine:100] Intake/Output this shift: No intake/output data recorded.   General appearance: alert and cooperative GI: soft, appropriately tender  Incision: no significant drainage, no significant erythema  Lab Results:   Recent Labs  02/11/17 0812 02/13/17 0442  WBC 12.5* 6.6  HGB 8.0* 6.7*  HCT 26.3* 22.1*  PLT 386 366   BMET  Recent Labs  02/11/17 0812 02/13/17 0442  NA 134* 139  K 3.7 3.4*  CL 101 102  CO2 24 29  GLUCOSE 133* 98  BUN 10 7  CREATININE 0.85 0.78  CALCIUM 8.5* 9.0   PT/INR No results for input(s): LABPROT, INR in the last 72 hours. ABG No results for input(s): PHART, HCO3 in the last 72 hours.  Invalid input(s): PCO2, PO2  MEDS, Scheduled . sodium chloride   Intravenous Once  . acetaminophen  1,000 mg Oral TID  . diphenhydrAMINE  25 mg Oral QHS   And  . acetaminophen  500 mg Oral QHS  . atorvastatin  40 mg Oral QPM  . buPROPion  100 mg Oral BID  . carvedilol  3.125 mg Oral BID WC  . chlorhexidine  15 mL Mouth Rinse BID  . enoxaparin (LOVENOX) injection  40 mg Subcutaneous Q24H  . escitalopram  10 mg Oral Daily  . feeding supplement  1 Container Oral TID BM  . fluticasone furoate-vilanterol  1 puff Inhalation Daily  . lip balm  1 application Topical BID  . loratadine  10 mg Oral QHS  . LORazepam  1 mg Oral QHS  . mouth rinse  15 mL Mouth Rinse q12n4p  . ondansetron  4 mg Oral TID AC & HS  . pantoprazole  40 mg Oral Daily   . saccharomyces boulardii  250 mg Oral BID  . sodium chloride flush  3 mL Intravenous Q12H    Studies/Results: No results found.  Assessment: s/p Procedure(s): LAPAROSCOPIC PARTIAL COLECTOMY Patient Active Problem List   Diagnosis Date Noted  . Cancer of ascending colon s/p lap colectomy 02/08/2017 02/08/2017  . Iron deficiency anemia   . Fundic gland polyps of stomach, benign   . Benign neoplasm of transverse colon   . Benign neoplasm of descending colon   . Chronic anticoagulation 12/22/2016  . Dehydration 12/06/2016  . Rash and nonspecific skin eruption 06/21/2016  . Cough 06/21/2016  . Fracture of L1 vertebra ([place]) 10/19/2015  . Weakness   . Compression fracture 09/13/2015  . Fall at home 09/13/2015  . History of lung cancer   . Mobility impaired   . Fatigue 08/18/2015  . Depression 08/18/2015  . Frequent stools 08/18/2015  . Tachypnea 06/22/2015  . SIRS (systemic inflammatory response syndrome) ([place]) 06/22/2015  . Conjunctivitis 06/06/2015  . Adenocarcinoma of upper lobe of right lung 04/30/2015  . Mediastinal adenopathy 04/24/2015  . Severe obstructive sleep apnea 03/27/2015  . Lung mass 03/27/2015  . Folate deficiency 09/21/2014  . Paroxysmal a-fib ([place]) 05/27/2014  . DOE (dyspnea on exertion) 05/27/2014  .  HTN (hypertension) 05/27/2014  . Dyslipidemia 05/27/2014  . Bradycardia 05/27/2014  . Hair loss 05/27/2014  . Breast cancer ([place]) 04/17/2014  . Prediabetes 04/17/2014  . CAD (coronary artery disease) 04/16/2014  . Diverticulitis 04/16/2014  . Hyperlipidemia 04/16/2014    Expected post op course  Plan: Advance diet to soft foods Acute on chronic blood loss anemia: transfuse 1 unit Unclear UOP: strict I's and Os Dispo: pt will need SNF post operatively   LOS: 5 days     .Rosario Adie, [facility], [place]   02/13/2017 8:35 AM

## 2017-02-13 NOTE — Progress Notes (Signed)
OT Cancellation Note  Patient Details Name: Pamela Mcguire MRN: [id number] DOB: 10-12-1935   Cancelled Treatment:    Reason Eval/Treat Not Completed: Other (comment)  Spoke with RN . Pts 1st bag of blood infilitated- beginning next bag shortly.  Will check on pt next day for OT eval as pts Hgb 6.7  [name], [name] [name] 02/13/2017, 1:07 PM

## 2017-02-13 NOTE — Progress Notes (Signed)
PT Cancellation Note  Patient Details Name: Pamela Mcguire MRN: [id number] DOB: 1935-08-23   Cancelled Treatment:      Pts 1st bag of blood infilitated- beginning next bag shortly .  HgB 6.7 and will need both bags of blood to participate.  Will check back tomorrow as schedule permits.     Rica Koyanagi  PTA [facility]  Acute  Rehab Pager      [phone]

## 2017-02-13 NOTE — Care Management Important Message (Signed)
Important Message  Patient Details  Name: Pamela Mcguire MRN: [id number] Date of Birth: 1935-10-08   Medicare Important Message Given:  Yes    Kerin Salen 02/13/2017, 1:07 [place] Message  Patient Details  Name: Pamela Mcguire MRN: [id number] Date of Birth: 1935-09-01   Medicare Important Message Given:  Yes    Kerin Salen 02/13/2017, 1:07 PM

## 2017-02-13 NOTE — Progress Notes (Signed)
Per order, attempted to wean pt off oxygen. Turned off oxygen, pt sats at 91%

## 2017-02-13 NOTE — Progress Notes (Signed)
Blood administration stopped due to IV infiltrated, blood bank notified

## 2017-02-13 NOTE — Progress Notes (Signed)
CRITICAL VALUE ALERT  Critical value received:  Hgb 6.7  Date of notification:  02/13/2017  Time of notification:  0530  Critical value read back:Yes.    Nurse who received alert:  Atha Starks  MD notified (1st page):  S.Gross   Time of first page:  0545  MD notified (2nd page):  Time of second page:0620  Responding MD:  Clyda Greener  Time MD responded:  [phone]

## 2017-02-14 LAB — CBC
HCT: 29.8 % — ABNORMAL LOW (ref 36.0–46.0)
Hemoglobin: 8.9 g/dL — ABNORMAL LOW (ref 12.0–15.0)
MCH: 22.6 pg — ABNORMAL LOW (ref 26.0–34.0)
MCHC: 29.9 g/dL — ABNORMAL LOW (ref 30.0–36.0)
MCV: 75.6 fL — ABNORMAL LOW (ref 78.0–100.0)
PLATELETS: 408 10*3/uL — AB (ref 150–400)
RBC: 3.94 MIL/uL (ref 3.87–5.11)
RDW: 18 % — ABNORMAL HIGH (ref 11.5–15.5)
WBC: 8.3 10*3/uL (ref 4.0–10.5)

## 2017-02-14 LAB — TYPE AND SCREEN
ABO/RH(D): B NEG
Antibody Screen: POSITIVE
DAT, IGG: NEGATIVE
Donor AG Type: NEGATIVE
Donor AG Type: NEGATIVE
UNIT DIVISION: 0
UNIT DIVISION: 0

## 2017-02-14 LAB — BPAM RBC
BLOOD PRODUCT EXPIRATION DATE: 201803222359
BLOOD PRODUCT EXPIRATION DATE: 201804112359
ISSUE DATE / TIME: 201803190943
ISSUE DATE / TIME: 201803191412
UNIT TYPE AND RH: 1700
UNIT TYPE AND RH: 9500

## 2017-02-14 LAB — BASIC METABOLIC PANEL
ANION GAP: 8 (ref 5–15)
BUN: 11 mg/dL (ref 6–20)
CALCIUM: 9 mg/dL (ref 8.9–10.3)
CO2: 30 mmol/L (ref 22–32)
Chloride: 101 mmol/L (ref 101–111)
Creatinine, Ser: 0.8 mg/dL (ref 0.44–1.00)
GFR calc Af Amer: >60 mL/min (ref >60)
Glucose, Bld: 111 mg/dL — ABNORMAL HIGH (ref 65–99)
POTASSIUM: 3.1 mmol/L — AB (ref 3.5–5.1)
SODIUM: 139 mmol/L (ref 135–145)

## 2017-02-14 LAB — HEMOGLOBIN AND HEMATOCRIT, BLOOD
HCT: 28.9 % — ABNORMAL LOW (ref 36.0–46.0)
HEMOGLOBIN: 8.9 g/dL — AB (ref 12.0–15.0)

## 2017-02-14 MED ORDER — SODIUM CHLORIDE 0.9 % IV SOLN
INTRAVENOUS | Status: DC
  Administered 2017-02-14: 12:00:00 via INTRAVENOUS

## 2017-02-14 NOTE — Progress Notes (Addendum)
CSW assisting with d/c planning. Pt has a SNF bed at [facility] when stable for d/c. CSW will request [facility] authorization for placement once updated PT / OT notes are available.  Werner Lean LCSW [phone]

## 2017-02-14 NOTE — Progress Notes (Signed)
Pt presented with dizziness while working with PT. BP 68/37. Pt pale and clammy. MD notified. After getting into recliner, BP 90/51, P 102, RR20, 02 91% on RA. 2L O2 via [place] administered. RN attempted to flush IV but it was not patent. IV team consult ordered. Pt c/o light headedness and dizziness even when lying down. Pt's BP rose to 118/90 with head down and feet up. Currently using bedpan. Will continue to monitor.

## 2017-02-14 NOTE — Progress Notes (Signed)
Patient resting in bed.  States that abdominal pain is 7/10.  Pain med given (Oxy-IR '10mg'$ ).  Daughter in room visiting.

## 2017-02-14 NOTE — Progress Notes (Signed)
Pt / OT notes sent to [facility] for authorization for SNF placement. [employer] requesting MD note from today and updated PT note. [employer] is questioning pt's readiness for dc due to BP. CSW will sent requested info once available.   Werner Lean LCSW [phone]

## 2017-02-14 NOTE — Evaluation (Signed)
Occupational Therapy Evaluation Patient Details Name: Pamela Mcguire MRN: [id number] DOB: 02/20/1935 Today's Date: 02/14/2017    History of Present Illness 82 y.o. female with h/o CAD, afib, HTN, GERD, lung cancer, L1 compression fx admitted with colon cancer, s/p R colectomy 02/08/17.    Clinical Impression   Pt admitted for R colectomy. Pt currently with functional limitations due to the deficits listed below (see OT Problem List).  Pt will benefit from skilled OT to increase their safety and independence with ADL and functional mobility for ADL to facilitate discharge to venue listed below.     Follow Up Recommendations  SNF    Equipment Recommendations  None recommended by OT    Recommendations for Other Services       Precautions / Restrictions Precautions Precautions: Fall Restrictions Weight Bearing Restrictions: No      Mobility Bed Mobility Overal bed mobility: Needs Assistance Bed Mobility: Supine to Sit     Supine to sit: Mod assist     General bed mobility comments: slight assist for trunk upright  Transfers Overall transfer level: Needs assistance Equipment used: Rolling walker (2 wheeled) Transfers: Sit to/from Stand Sit to Stand: Mod assist         General transfer comment: verbal cues for hand placement and safety         ADL Overall ADL's : Needs assistance/impaired     Grooming: Set up;Sitting   Upper Body Bathing: Minimal assistance;Sitting   Lower Body Bathing: Moderate assistance;Sit to/from stand;Cueing for safety;Cueing for sequencing   Upper Body Dressing : Minimal assistance;Sitting   Lower Body Dressing: Maximal assistance;Cueing for compensatory techniques;Cueing for safety;Cueing for sequencing   Toilet Transfer: RW;BSC;Stand-pivot;Cueing for sequencing;Minimal assistance;Moderate assistance   Toileting- Clothing Manipulation and Hygiene: Moderate assistance;Sit to/from stand;Cueing for sequencing         General ADL  Comments: pt dizzy with OT -  pts BP 119/68      Vision Patient Visual Report: No change from baseline       Perception     Praxis      Pertinent Vitals/Pain Pain Score: 6  Pain Location: abdomen Pain Descriptors / Indicators: Sore Pain Intervention(s): Limited activity within patient's tolerance;Monitored during session;Repositioned;Relaxation;Patient requesting pain meds-RN notified     Hand Dominance     Extremity/Trunk Assessment         Cervical / Trunk Assessment Cervical / Trunk Assessment: Normal   Communication     Cognition Arousal/Alertness: Awake/alert Behavior During Therapy: WFL for tasks assessed/performed Overall Cognitive Status: Within Functional Limits for tasks assessed                     General Comments       Exercises       Shoulder Instructions      Home Living Family/patient expects to be discharged to:: Private residence Living Arrangements: Children Available Help at Discharge: Family;Available PRN/intermittently Type of Home: House Home Access: Stairs to enter [employer] of Steps: 4   Home Layout: One level     Bathroom Shower/Tub: Tub/shower unit Shower/tub characteristics: Door       Home Equipment: Grab bars - tub/shower;Walker - 4 wheels          Prior Functioning/Environment Level of Independence: Needs assistance  Gait / Transfers Assistance Needed: uses RW, assist for stairs ADL's / Homemaking Assistance Needed: independent, stands in shower using grab bar   Comments: uses RW, goes between 2 daughters' homes, is alone a few hours  per day, sit to stand was challenging PTA per daughter, no falls in past 1 year        OT Problem List: Decreased strength;Decreased activity tolerance;Pain                          End of Session Equipment Utilized During Treatment: Rolling walker;Gait belt Nurse Communication: Mobility status  Activity Tolerance: Patient tolerated treatment  well Patient left: in chair;with call bell/phone within reach;with chair alarm set  OT Visit Diagnosis: Unsteadiness on feet (R26.81);Pain;Muscle weakness (generalized) (M62.81)                ADL either performed or assessed with clinical judgement  Time: 0955-1010 OT Time Calculation (min): 15 min Charges:  OT General Charges $OT Visit: 1 Procedure OT Evaluation $OT Eval Moderate Complexity: 1 Procedure G-Codes:     Kari Baars, [place]  Payton Mccallum D 02/14/2017, 10:19 AM

## 2017-02-14 NOTE — Progress Notes (Signed)
Physical Therapy Treatment Patient Details Name: Pamela Mcguire MRN: [id number] DOB: 1935-08-09 Today's Date: 02/14/2017    History of Present Illness 82 y.o. female with h/o CAD, afib, HTN, GERD, lung cancer, L1 compression fx admitted with colon cancer, s/p R colectomy 02/08/17.     PT Comments    Pt OOB in recliner via OT.  Pt c/o dizziness just sitting in chair prior to activity.  BP 112/53, HR 85, RA 93.  + 2 assist for safety pt amb a limited distance.  BP after 12 feet BP 68/37, HR 96, RA 96%.  Recliner brought to pt and RN notified.    Follow Up Recommendations  SNF;Supervision for mobility/OOB     Equipment Recommendations  None recommended by PT    Recommendations for Other Services       Precautions / Restrictions Precautions Precautions: Fall Precaution Comments: abdominal incision LAP 02/08/2017 Restrictions Weight Bearing Restrictions: No    Mobility  Bed Mobility Overal bed mobility: Needs Assistance Bed Mobility: Supine to Sit     Supine to sit: Mod assist     General bed mobility comments: OOB in recliner  Transfers Overall transfer level: Needs assistance Equipment used: Rolling walker (2 wheeled) Transfers: Sit to/from Stand Sit to Stand: Min assist;Mod assist;+2 physical assistance         General transfer comment: + 2 assist for safety as pt c/o alot dizziness sitting in recliner before activity  Ambulation/Gait Ambulation/Gait assistance: Min assist;+2 physical assistance;+2 safety/equipment Ambulation Distance (Feet): 12 Feet Assistive device: Rolling walker (2 wheeled) Gait Pattern/deviations: Step-through pattern;Decreased stride length Gait velocity: decreased   General Gait Details: limited amb distance due to increased c/o of dizziness.  Recliner brought to pt with BP 68/37 RN called to hallway for assist.    Stairs            Wheelchair Mobility    Modified Rankin (Stroke Patients Only)       Balance                                    Cognition Arousal/Alertness: Awake/alert Behavior During Therapy: WFL for tasks assessed/performed Overall Cognitive Status: Within Functional Limits for tasks assessed                      Exercises      General Comments        Pertinent Vitals/Pain Pain Assessment: Faces Pain Score: 6  Faces Pain Scale: Hurts little more Pain Location: abdomen with activity Pain Descriptors / Indicators: Grimacing;Operative site guarding Pain Intervention(s): Monitored during session    Home Living Family/patient expects to be discharged to:: Private residence Living Arrangements: Children Available Help at Discharge: Family;Available PRN/intermittently Type of Home: House Home Access: Stairs to enter   Home Layout: One level Home Equipment: Grab bars - tub/shower;Walker - 4 wheels      Prior Function Level of Independence: Needs assistance  Gait / Transfers Assistance Needed: uses RW, assist for stairs ADL's / Homemaking Assistance Needed: independent, stands in shower using grab bar Comments: uses RW, goes between 2 daughters' homes, is alone a few hours per day, sit to stand was challenging PTA per daughter, no falls in past 1 year   PT Goals (current goals can now be found in the care plan section) Progress towards PT goals: Progressing toward goals    Frequency    Min 3X/week  PT Plan Current plan remains appropriate    Co-evaluation             End of Session Equipment Utilized During Treatment: Gait belt Activity Tolerance: Other (comment) (hypotensive) Patient left: with call bell/phone within reach;with nursing/sitter in room;in chair Nurse Communication: Mobility status PT Visit Diagnosis: Difficulty in walking, not elsewhere classified (R26.2)     Time: 1023-1040 PT Time Calculation (min) (ACUTE ONLY): 17 min  Charges:  $Gait Training: 8-22 mins                    G Codes:       Rica Koyanagi  PTA [facility]   Acute  Rehab Pager      [phone]

## 2017-02-14 NOTE — Progress Notes (Signed)
6 Days Post-Op lap R colectomy Subjective:  tolerating a diet and having bowel function, pt states she's urinating several times a day.  Had an episode of orthostatic hypotension this am.  BP improved with lying down  Objective: Vital signs in last 24 hours: Temp:  [97.6 F (36.4 C)-98.5 F (36.9 C)] 97.6 F (36.4 C) (03/20 0625) Pulse Rate:  [78-104] 78 (03/20 0826) Resp:  [14-15] 15 (03/20 0625) BP: (104-161)/(52-80) 110/68 (03/20 0826) SpO2:  [90 %-100 %] 90 % (03/20 0625)   Intake/Output from previous day: 03/19 0701 - 03/20 0700 In: 720 [P.O.:720] Out: 400 [Urine:400] Intake/Output this shift: Total I/O In: 340 [P.O.:340] Out: 150 [Urine:150]   General appearance: alert and cooperative GI: soft, appropriately tender  Incision: no significant drainage, no significant erythema  Lab Results:   Recent Labs  02/13/17 0442 02/14/17 0532 02/14/17 1144  WBC 6.6 8.3  --   HGB 6.7* 8.9* 8.9*  HCT 22.1* 29.8* 28.9*  PLT 366 408*  --    BMET  Recent Labs  02/13/17 0442 02/14/17 0532  NA 139 139  K 3.4* 3.1*  CL 102 101  CO2 29 30  GLUCOSE 98 111*  BUN 7 11  CREATININE 0.78 0.80  CALCIUM 9.0 9.0   PT/INR No results for input(s): LABPROT, INR in the last 72 hours. ABG No results for input(s): PHART, HCO3 in the last 72 hours.  Invalid input(s): PCO2, PO2  MEDS, Scheduled . acetaminophen  1,000 mg Oral TID  . diphenhydrAMINE  25 mg Oral QHS   And  . acetaminophen  500 mg Oral QHS  . atorvastatin  40 mg Oral QPM  . buPROPion  100 mg Oral BID  . carvedilol  3.125 mg Oral BID WC  . chlorhexidine  15 mL Mouth Rinse BID  . enoxaparin (LOVENOX) injection  40 mg Subcutaneous Q24H  . escitalopram  10 mg Oral Daily  . feeding supplement  1 Container Oral TID BM  . fluticasone furoate-vilanterol  1 puff Inhalation Daily  . lip balm  1 application Topical BID  . loratadine  10 mg Oral QHS  . LORazepam  1 mg Oral QHS  . mouth rinse  15 mL Mouth Rinse q12n4p   . ondansetron  4 mg Oral TID AC & HS  . pantoprazole  40 mg Oral Daily  . saccharomyces boulardii  250 mg Oral BID  . sodium chloride flush  3 mL Intravenous Q12H    Studies/Results: No results found.  Assessment: s/p Procedure(s): LAPAROSCOPIC PARTIAL COLECTOMY Patient Active Problem List   Diagnosis Date Noted  . Cancer of ascending colon s/p lap colectomy 02/08/2017 02/08/2017  . Iron deficiency anemia   . Fundic gland polyps of stomach, benign   . Benign neoplasm of transverse colon   . Benign neoplasm of descending colon   . Chronic anticoagulation 12/22/2016  . Dehydration 12/06/2016  . Rash and nonspecific skin eruption 06/21/2016  . Cough 06/21/2016  . Fracture of L1 vertebra ([place]) 10/19/2015  . Weakness   . Compression fracture 09/13/2015  . Fall at home 09/13/2015  . History of lung cancer   . Mobility impaired   . Fatigue 08/18/2015  . Depression 08/18/2015  . Frequent stools 08/18/2015  . Tachypnea 06/22/2015  . SIRS (systemic inflammatory response syndrome) ([place]) 06/22/2015  . Conjunctivitis 06/06/2015  . Adenocarcinoma of upper lobe of right lung 04/30/2015  . Mediastinal adenopathy 04/24/2015  . Severe obstructive sleep apnea 03/27/2015  . Lung mass 03/27/2015  .  Folate deficiency 09/21/2014  . Paroxysmal a-fib ([place]) 05/27/2014  . DOE (dyspnea on exertion) 05/27/2014  . HTN (hypertension) 05/27/2014  . Dyslipidemia 05/27/2014  . Bradycardia 05/27/2014  . Hair loss 05/27/2014  . Breast cancer ([place]) 04/17/2014  . Prediabetes 04/17/2014  . CAD (coronary artery disease) 04/16/2014  . Diverticulitis 04/16/2014  . Hyperlipidemia 04/16/2014    Expected post op course  Plan: Cont soft foods Acute on chronic blood loss anemia: Hgb appropriately responsive to transfusion.  Recheck in AM Unclear UOP: strict I's and Os Dispo: pt will need SNF post operatively.  Anticipate d/c Wed or Thu as long as hgb remains stable   LOS: 6 days     .Rosario Adie, [facility], [place]   02/14/2017 1:22 PM

## 2017-02-15 LAB — HEMOGLOBIN AND HEMATOCRIT, BLOOD
HCT: 29.4 % — ABNORMAL LOW (ref 36.0–46.0)
Hemoglobin: 9 g/dL — ABNORMAL LOW (ref 12.0–15.0)

## 2017-02-15 MED ORDER — OXYCODONE HCL 5 MG PO TABS: 5.0000 mg | ORAL_TABLET | ORAL | 0 refills | Status: DC | PRN

## 2017-02-15 NOTE — Progress Notes (Addendum)
PT / DC Summary has been submitted to [facility] with request for SNF approval. A decision is pending. CSW will continue to follow to assist with d/c planning to SNF. Pt has SNF bed at [facility] today once authorization has been received.  Werner Lean LCSW [id number]

## 2017-02-15 NOTE — Progress Notes (Signed)
Patient report was called to Quillian Quince at [phone]. All questions were answered. I also called daughter [name] and informed her that Corey Harold was in route to pick up her mother to transport her to [facility] [place]

## 2017-02-15 NOTE — Discharge Summary (Addendum)
Patient ID: Pamela Mcguire [id number] 82 y.o. [birth year]  Admission date: 02/08/2017  Discharge date and time: 02/15/2017  Admitting Physician: Rosario Adie.  Discharge Physician: Rosario Adie.  Admission Diagnoses: ascending colon cancer  Discharge Diagnoses: stage 2 colon cancer  Operations: Procedure(s): LAPAROSCOPIC PARTIAL COLECTOMY  Discharged Condition: good  Hospital Course: Patient was admitted after surgery.  Her diet was slowly advanced.  She had an episode of anastomotic bleeding that required 1 unit blood transfusion.  Her Hgb has now been stable for 2 days.  She did have an episode of orthostatic hypotension that responded to gentle hydration.  She will need 24 h supervision for assistance in healing over the next couple weeks.  I have recommended that she go to a SNF for rehab since she lives alone.   Consults: None  Significant Diagnostic Studies: labs: cbc, chemistry  Treatments: IV hydration and surgery: lap R colectomy  Disposition: Skilled nursing facility   Meds: Allergies as of 02/15/2017   No Known Allergies     Medication List    TAKE these medications   albuterol 108 (90 Base) MCG/ACT inhaler Commonly known as:  PROVENTIL HFA;VENTOLIN HFA Inhale 1 puff into the lungs every 6 (six) hours as needed for wheezing or shortness of breath. Reported on 06/01/2016   apixaban 5 MG Tabs tablet Commonly known as:  ELIQUIS Take 1 tablet (5 mg total) by mouth 2 (two) times daily. DO NOT TAKE AT THIS TIME - AWAIT FURTHER INSTRUCTIONS 01/13/2017   atorvastatin 40 MG tablet Commonly known as:  LIPITOR Take 1 tablet (40 mg total) by mouth daily. What changed:  when to take this   buPROPion 100 MG 12 hr tablet Commonly known as:  WELLBUTRIN SR TAKE 1 TABLET(100 MG) BY MOUTH DAILY   CALCIUM-VITAMIN D PO Take 1 tablet by mouth daily.   carvedilol 3.125 MG tablet Commonly known as:  COREG TAKE 1 TABLET(3.125 MG) BY MOUTH TWICE DAILY    diphenhydramine-acetaminophen 25-500 MG Tabs tablet Commonly known as:  TYLENOL PM Take 1 tablet by mouth at bedtime. Reported on 06/01/2016   escitalopram 10 MG tablet Commonly known as:  LEXAPRO Take 1 tablet (10 mg total) by mouth daily. Yearly physical due in May must see Md for refills   feeding supplement Liqd Take 1 Container by mouth 3 (three) times daily between meals. What changed:  when to take this   fluticasone furoate-vilanterol 100-25 MCG/INH Aepb Commonly known as:  BREO ELLIPTA Inhale 1 puff into the lungs daily.   INTEGRA PLUS Caps One cap po qd   loperamide 2 MG tablet Commonly known as:  IMODIUM A-D Take 2-4 mg by mouth 4 (four) times daily as needed for diarrhea or loose stools.   loratadine 10 MG tablet Commonly known as:  CLARITIN TAKE 1 TABLET BY MOUTH AT BEDTIME   LORazepam 1 MG tablet Commonly known as:  ATIVAN Take 1 tablet (1 mg total) by mouth at bedtime. for anxiety   Melatonin 10 MG Caps Take 10 mg by mouth at bedtime.   oxyCODONE 5 MG immediate release tablet Commonly known as:  Oxy IR/ROXICODONE Take 1-2 tablets (5-10 mg total) by mouth every 4 (four) hours as needed for severe pain. Needs OV for additional refills. What changed:  how much to take  when to take this  additional instructions   oxyCODONE 5 MG immediate release tablet Commonly known as:  Oxy IR/ROXICODONE Take 1-2 tablets (5-10 mg total) by mouth every 4 (four) hours  as needed for severe pain. What changed:  You were already taking a medication with the same name, and this prescription was added. Make sure you understand how and when to take each.   pantoprazole 40 MG tablet Commonly known as:  PROTONIX TAKE 1 TABLET BY MOUTH EVERY DAY   UNABLE TO FIND Med Name: Rayburn Felt

## 2017-02-15 NOTE — Progress Notes (Signed)
Physical Therapy Treatment Patient Details Name: Pamela Mcguire MRN: [id number] DOB: 14-May-1935 Today's Date: 02/15/2017    History of Present Illness 82 y.o. female with h/o CAD, afib, HTN, GERD, lung cancer, L1 compression fx admitted with colon cancer, s/p R colectomy 02/08/17.     PT Comments    Pt still c/o "a lot" of dizziness.  Assisted from supine to EOB first words were "I;m dizzy".    Orthostatic vitals taken:  Supine    BP 134/47, HR 90, RA 98%                                          EOB       BP 121/51, HR 94, RA 97%                         After amb 15 feet     BP 99/41, HR 104, RA 88%  c/o dizzy and nausea Activity tolerance limited and very unsteady gait.  Pt will need ST Rehab at SNF prior to returning home.   Follow Up Recommendations  SNF;Supervision for mobility/OOB     Equipment Recommendations  None recommended by PT    Recommendations for Other Services       Precautions / Restrictions Precautions Precautions: Fall Precaution Comments: abdominal incision LAP 02/08/2017 Restrictions Weight Bearing Restrictions: No    Mobility  Bed Mobility Overal bed mobility: Needs Assistance Bed Mobility: Supine to Sit;Sit to Supine     Supine to sit: Min assist Sit to supine: Min assist   General bed mobility comments: increased time  Transfers Overall transfer level: Needs assistance Equipment used: Rolling walker (2 wheeled) Transfers: Sit to/from [employer] Transfers   Stand pivot transfers: Min assist;Mod assist       General transfer comment: 25% VC's on safety with turns completion and hand placement with stand to sit to control decend.    Ambulation/Gait Ambulation/Gait assistance: Min assist Ambulation Distance (Feet): 15 Feet Assistive device: Rolling walker (2 wheeled) Gait Pattern/deviations: Step-to pattern;Step-through pattern;Drifts right/left Gait velocity: decreased Gait velocity interpretation: at or above normal speed for  age/gender General Gait Details: limited distance due to c/o increased dizziness associated with mild anxiety, increased respirations.     Stairs            Wheelchair Mobility    Modified Rankin (Stroke Patients Only)       Balance                                    Cognition Arousal/Alertness: Awake/alert Behavior During Therapy: WFL for tasks assessed/performed Overall Cognitive Status: Within Functional Limits for tasks assessed                 General Comments: noted mild anxiety with activity and worry    Exercises      General Comments        Pertinent Vitals/Pain Pain Assessment: Faces Faces Pain Scale: Hurts a little bit Pain Location: abdomen with activity Pain Descriptors / Indicators: Grimacing;Operative site guarding Pain Intervention(s): Monitored during session    Home Living                      Prior Function  PT Goals (current goals can now be found in the care plan section) Progress towards PT goals: Progressing toward goals    Frequency    Min 3X/week      PT Plan Current plan remains appropriate    Co-evaluation             End of Session Equipment Utilized During Treatment: Gait belt Activity Tolerance: Patient limited by fatigue Patient left: in bed;with call bell/phone within reach Nurse Communication: Mobility status PT Visit Diagnosis: Difficulty in walking, not elsewhere classified (R26.2)     Time: 2353-6144 PT Time Calculation (min) (ACUTE ONLY): 25 min  Charges:  $Gait Training: 8-22 mins $Therapeutic Activity: 8-22 mins                    G Codes:       Rica Koyanagi  PTA [facility]  Acute  Rehab Pager      [phone]

## 2017-02-16 ENCOUNTER — Telehealth: Payer: Self-pay | Admitting: *Deleted

## 2017-02-16 ENCOUNTER — Non-Acute Institutional Stay (SKILLED_NURSING_FACILITY): Payer: Medicare HMO | Admitting: Adult Health

## 2017-02-16 ENCOUNTER — Encounter: Payer: Self-pay | Admitting: Adult Health

## 2017-02-16 DIAGNOSIS — E785 Hyperlipidemia, unspecified: Secondary | ICD-10-CM | POA: Diagnosis not present

## 2017-02-16 DIAGNOSIS — K922 Gastrointestinal hemorrhage, unspecified: Secondary | ICD-10-CM

## 2017-02-16 DIAGNOSIS — F418 Other specified anxiety disorders: Secondary | ICD-10-CM | POA: Diagnosis not present

## 2017-02-16 DIAGNOSIS — I480 Paroxysmal atrial fibrillation: Secondary | ICD-10-CM | POA: Diagnosis not present

## 2017-02-16 DIAGNOSIS — C182 Malignant neoplasm of ascending colon: Secondary | ICD-10-CM | POA: Diagnosis not present

## 2017-02-16 DIAGNOSIS — R5381 Other malaise: Secondary | ICD-10-CM

## 2017-02-16 DIAGNOSIS — C3410 Malignant neoplasm of upper lobe, unspecified bronchus or lung: Secondary | ICD-10-CM | POA: Diagnosis not present

## 2017-02-16 DIAGNOSIS — E876 Hypokalemia: Secondary | ICD-10-CM

## 2017-02-16 DIAGNOSIS — G4700 Insomnia, unspecified: Secondary | ICD-10-CM

## 2017-02-16 DIAGNOSIS — D62 Acute posthemorrhagic anemia: Secondary | ICD-10-CM

## 2017-02-16 NOTE — Telephone Encounter (Signed)
Pt was on TCM list admitted 3/16 for ascending colon cancer. Had a  LAPAROSCOPIC PARTIAL COLECTOMY done, and D/C 02/15/17 and sen tot SNF...[name]

## 2017-02-16 NOTE — Progress Notes (Signed)
DATE:  02/16/2017   MRN:  [id number]  BIRTHDAY: August 13, 1935  Facility:  Nursing Home Location:  [facility] Room Number: 626-R  LEVEL OF CARE:  SNF [phone])  Contact Information    Name [place] Daughter [phone]  [phone]   [facility] Daughter [phone]   [facility] Daughter   [phone]   Raynald Kemp Other [phone]  [phone]       Code Status History    Date Active Date Inactive Code Status Order ID Comments User Context   02/08/2017 12:39 PM 02/15/2017  7:35 PM Full Code [id number]  Leighton Ruff, MD Inpatient   09/13/2015  6:19 PM 09/17/2015  5:47 PM Full Code [id number]  Kelvin Cellar, MD Inpatient   06/22/2015  2:40 AM 06/26/2015  7:25 PM Full Code [id number]  Theressa Millard, MD Inpatient       Chief Complaint  Patient presents with  . Medical Management of Chronic Issues    HISTORY OF PRESENT ILLNESS:  This is an 82-YO female seen for hospital follow-up.  She was admitted to [facility] for short-term rehabilitation on 02/15/2017 following an admission at [facility] 02/08/2017-02/15/2017 for laparoscopic partial colectomy due to stage 2 colon cancer. She had an episode of anastomotic bleeding that required 1 unit blood transfusion. She had an episode of orthostatic hypotension and was resolved with gentle hydration.   She was being followed-up for lung cancer and underwent PET scan for evaluation of a lung lesion. A mass in the cecal region showed. Biopsy revealed invasive carcinoma.    She was seen in the room today with son-in-law at bedside.    PAST MEDICAL HISTORY:  Past Medical History:  Diagnosis Date  . AF (atrial fibrillation) ([place])   . Anxiety   . Arthritis    "thighs, hips, arms" (04/28/2015)  . Breast cancer ([place])    right mastectomy  . CAD (coronary artery disease)   . Dehydration 12/06/2016  . Dementia   . Depression   .  Diverticulitis   . GERD (gastroesophageal reflux disease)   . HCAP (healthcare-associated pneumonia) 09/15/2015  . Hyperlipidemia   . Hypertension   . Non-small cell carcinoma of lung, stage 3 ([place]) 04/30/2015  . Prediabetes   . Seasonal allergies   . Shortness of breath dyspnea   . Sleep apnea     dx'd 10/2014,refused cpap machine  . Urinary frequency   . UTI (urinary tract infection) 06/06/2015     CURRENT MEDICATIONS: Reviewed  Patient's Medications  New Prescriptions   No medications on file  Previous Medications   ALBUTEROL (PROVENTIL HFA;VENTOLIN HFA) 108 (90 BASE) MCG/ACT INHALER    Inhale 1 puff into the lungs every 6 (six) hours as needed for wheezing or shortness of breath. Reported on 06/01/2016   APIXABAN (ELIQUIS) 5 MG TABS TABLET    Take 1 tablet (5 mg total) by mouth 2 (two) times daily. DO NOT TAKE AT THIS TIME - AWAIT FURTHER INSTRUCTIONS 01/13/2017   ATORVASTATIN (LIPITOR) 40 MG TABLET    Take 1 tablet (40 mg total) by mouth daily.   BUPROPION (WELLBUTRIN SR) 100 MG 12 HR TABLET    TAKE 1 TABLET(100 MG) BY MOUTH DAILY   CALCIUM-VITAMIN D PO    Take 1 tablet by mouth daily.   CARVEDILOL (COREG) 3.125 MG TABLET    TAKE 1 TABLET(3.125 MG) BY MOUTH TWICE DAILY   DIPHENHYDRAMINE-ACETAMINOPHEN (TYLENOL PM) 25-500 MG TABS  TABLET    Take 1 tablet by mouth at bedtime. Reported on 06/01/2016   ESCITALOPRAM (LEXAPRO) 10 MG TABLET    Take 1 tablet (10 mg total) by mouth daily. Yearly physical due in May must see Md for refills   FLUTICASONE FUROATE-VILANTEROL (BREO ELLIPTA) 100-25 MCG/INH AEPB    Inhale 1 puff into the lungs daily.   LOPERAMIDE (IMODIUM A-D) 2 MG TABLET    Take 2-4 mg by mouth 4 (four) times daily as needed for diarrhea or loose stools.    LORATADINE (CLARITIN) 10 MG TABLET    TAKE 1 TABLET BY MOUTH AT BEDTIME   LORAZEPAM (ATIVAN) 1 MG TABLET    Take 1 tablet (1 mg total) by mouth at bedtime. for anxiety   MELATONIN 10 MG CAPS    Take 10 mg by mouth at bedtime.     MULTIPLE VITAMINS-MINERALS (MULTIVITAMIN WITH MINERALS) TABLET    Take 1 tablet by mouth daily.   NUTRITIONAL SUPPLEMENT LIQD    Take 120 mLs by mouth 3 (three) times daily. MedPass   OXYCODONE (OXY IR/ROXICODONE) 5 MG IMMEDIATE RELEASE TABLET    Take 1-2 tablets (5-10 mg total) by mouth every 4 (four) hours as needed for severe pain.   PANTOPRAZOLE (PROTONIX) 40 MG TABLET    TAKE 1 TABLET BY MOUTH EVERY DAY  Modified Medications   No medications on file  Discontinued Medications   FEEDING SUPPLEMENT (BOOST / RESOURCE BREEZE) LIQD    Take 1 Container by mouth 3 (three) times daily between meals.   FEFUM-FEPOLY-FA-B CMP-C-BIOT (INTEGRA PLUS) CAPS    One cap po qd   OXYCODONE (OXY IR/ROXICODONE) 5 MG IMMEDIATE RELEASE TABLET    Take 1-2 tablets (5-10 mg total) by mouth every 4 (four) hours as needed for severe pain. Needs OV for additional refills.   UNABLE TO FIND    Med Name: Wig     No Known Allergies   REVIEW OF SYSTEMS:  GENERAL: no change in appetite, no fatigue, no weight changes, no fever, chills or weakness EYES: Denies change in vision, dry eyes, eye pain, itching or discharge EARS: Denies change in hearing, ringing in ears, or earache NOSE: Denies nasal congestion or epistaxis MOUTH and THROAT: Denies oral discomfort, gingival pain or bleeding, pain from teeth or hoarseness   RESPIRATORY: no cough, SOB, DOE, wheezing, hemoptysis CARDIAC: no chest pain, edema or palpitations GI: no abdominal pain, diarrhea, constipation, heart burn, nausea or vomiting GU: Denies dysuria, frequency, hematuria, incontinence, or discharge PSYCHIATRIC: Denies feeling of depression or anxiety. No report of hallucinations, insomnia, paranoia, or agitation     PHYSICAL EXAMINATION  GENERAL APPEARANCE: Well nourished. In no acute distress. Obese SKIN:  Laparoscopic surgical site by the navel is dry, no erythema HEAD: Normal in size and contour. No evidence of trauma EYES: Lids open and close  normally. No blepharitis, entropion or ectropion. PERRL. Conjunctivae are clear and sclerae are white. Lenses are without opacity EARS: Pinnae are normal. Patient hears normal voice tunes of the examiner MOUTH and THROAT: Lips are without lesions. Oral mucosa is moist and without lesions. Tongue is normal in shape, size, and color and without lesions NECK: supple, trachea midline, no neck masses, no thyroid tenderness, no thyromegaly LYMPHATICS: no LAN in the neck, no supraclavicular LAN RESPIRATORY: breathing is even & unlabored, BS CTAB CARDIAC: RRR, no murmur,no extra heart sounds, no edema GI: abdomen soft, normal BS, no masses, no tenderness, no hepatomegaly, no splenomegaly EXTREMITIES:  Able to move  4 extremities PSYCHIATRIC: Alert and oriented X 3. Affect and behavior are appropriate   LABS/RADIOLOGY: Labs reviewed: Basic Metabolic Panel:  Recent Labs  02/11/17 0812 02/13/17 0442 02/14/17 0532  NA 134* 139 139  K 3.7 3.4* 3.1*  CL 101 102 101  CO2 '24 29 30  '$ GLUCOSE 133* 98 111*  BUN '10 7 11  '$ CREATININE 0.85 0.78 0.80  CALCIUM 8.5* 9.0 9.0  MG  --  1.5*  --    Liver Function Tests:  Recent Labs  05/02/16 1119 11/29/16 1149  AST 13 13  ALT 11 8  ALKPHOS 59 58  BILITOT 0.35 0.42  PROT 6.9 7.1  ALBUMIN 3.3* 3.6   CBC:  Recent Labs  11/29/16 1149 12/06/16 1411 12/22/16 1547  02/11/17 0812 02/13/17 0442 02/14/17 0532 02/14/17 1144 02/15/17 0529  WBC 7.7 8.2 8.4  < > 12.5* 6.6 8.3  --   --   NEUTROABS 6.1 6.5 6.7  --   --   --   --   --   --   HGB 7.4* 7.0* 9.9*  < > 8.0* 6.7* 8.9* 8.9* 9.0*  HCT 25.4* 24.3* 31.2*  < > 26.3* 22.1* 29.8* 28.9* 29.4*  MCV 73.8* 72.8* 73.9*  < > 76.2* 76.2* 75.6*  --   --   PLT 385 452* 369.0  < > 386 366 408*  --   --   < > = values in this interval not displayed. CBG:  Recent Labs  12/22/16 1058  GLUCAP 134*      ASSESSMENT/PLAN:  Physical deconditioning - for rehabilitation, PT and OT, for therapeutic  strengthening exercises; fall precautions  Colon cancer s/p lap partial colectomy - follow-up with Dr. Leighton Ruff, surgery, continue oxycodone 5 mg 1-2 tabs by mouth every 4 hours when necessary for pain  Paroxysmal atrial fibrillation - rate controlled; continue Eliquis 5 mg 1 tab by mouth twice a day, carvedilol 3.125 mg 1 tab by mouth twice a day  Depression with anxiety - continue Wellbutrin SR 100 mg 1 tab by mouth daily, Lexapro 10 mg 1 tab by mouth daily and Ativan 1 mg 1 tab by mouth daily at bedtime  GI bleed - continue Protonix 40 mg 1 tab by mouth daily  Hyperlipidemia - continue atorvastatin 40 mg 1 tab by mouth daily  Insomnia - continue melatonin 5 mg give 2 tabs = 10 mg by mouth daily at bedtime  Anemia, acute blood loss - S/P transfusion of 1 unit blood, check CBC Lab Results  Component Value Date   HGB 9.0 (L) 02/15/2017   Hypokalemia - check BMP Lab Results  Component Value Date   K 3.1 (L) 02/14/2017   Malignant neoplasm of upper lobe lung - follows up with oncology, continue Breo-Ellipta 100-25 mcg inhale 1 puff into the lungs daily and Proventil 90 g inhaler inhale 1 puff into the lungs every 6 hours when necessary     Goals of care:  Short-term rehabilitation   Monina C. Kentwood - NP    [employer] [phone]

## 2017-02-17 ENCOUNTER — Non-Acute Institutional Stay (SKILLED_NURSING_FACILITY): Payer: Medicare HMO | Admitting: Internal Medicine

## 2017-02-17 ENCOUNTER — Encounter: Payer: Self-pay | Admitting: Internal Medicine

## 2017-02-17 DIAGNOSIS — R531 Weakness: Secondary | ICD-10-CM | POA: Diagnosis not present

## 2017-02-17 DIAGNOSIS — D62 Acute posthemorrhagic anemia: Secondary | ICD-10-CM

## 2017-02-17 DIAGNOSIS — K219 Gastro-esophageal reflux disease without esophagitis: Secondary | ICD-10-CM

## 2017-02-17 DIAGNOSIS — C182 Malignant neoplasm of ascending colon: Secondary | ICD-10-CM

## 2017-02-17 DIAGNOSIS — C3411 Malignant neoplasm of upper lobe, right bronchus or lung: Secondary | ICD-10-CM | POA: Diagnosis not present

## 2017-02-17 DIAGNOSIS — R1310 Dysphagia, unspecified: Secondary | ICD-10-CM

## 2017-02-17 DIAGNOSIS — I480 Paroxysmal atrial fibrillation: Secondary | ICD-10-CM | POA: Diagnosis not present

## 2017-02-17 DIAGNOSIS — F329 Major depressive disorder, single episode, unspecified: Secondary | ICD-10-CM | POA: Diagnosis not present

## 2017-02-17 LAB — BASIC METABOLIC PANEL
BUN: 12 mg/dL (ref 4–21)
Creatinine: 0.7 mg/dL (ref 0.5–1.1)
Glucose: 123 mg/dL
Potassium: 4.2 mmol/L (ref 3.4–5.3)
Sodium: 140 mmol/L (ref 137–147)

## 2017-02-17 LAB — CBC AND DIFFERENTIAL
HEMATOCRIT: 29 % — AB (ref 36–46)
HEMOGLOBIN: 8.6 g/dL — AB (ref 12.0–16.0)
NEUTROS ABS: 8 /uL
PLATELETS: 467 10*3/uL — AB (ref 150–399)
WBC: 9.7 10*3/mL

## 2017-02-17 NOTE — Progress Notes (Signed)
LOCATION: [facility]  PCP: Mauricio Po, FNP   Code Status: Full Code  Goals of care: Advanced Directive information Advanced Directives 02/12/2017  Does Patient Have a Medical Advance Directive? Yes  Type of Advance Directive -  Does patient want to make changes to medical advance directive? Yes (Inpatient - patient defers changing a medical advance directive at this time)  Copy of [place] in Chart? -  Would patient like information on creating a medical advance directive? No - Patient declined  Pre-existing out of facility DNR order (yellow form or pink MOST form) -       Extended Emergency Contact Information Primary Emergency Contact: Perez,Donna Address: [address]          [place], [place] [address] Johnnette Litter of [place] Phone: [phone] Mobile Phone: [phone] Relation: Daughter Secondary Emergency Contact: [name] Address: [address] [place]          [place], [place] [address] Johnnette Litter of [place] Phone: [phone] Work Phone: [phone] Mobile Phone: [phone] Relation: Daughter   No Known Allergies  Chief Complaint  Patient presents with  . New Admit To SNF    New Admission Visit      HPI:  Patient is a 82 y.o. female seen today for short term rehabilitation post hospital admission from 02/08/17-02/15/17. She underwent laparoscopy partial colectomy for stage II colon cancer. She had an episode of anastomotic bleeding and required one unit packed red blood cell transfusion. She had orthostasis and required IV fluids. She has medical history of adenocarcinoma lung, stage II colon cancer, atrial fibrillation, arthritis, breast cancer status post right mastectomy, coronary artery disease, GERD, hyperlipidemia among others. She is seen in her room today with her son-in-law present at bedside.  Review of Systems:  Constitutional: Negative for fever, chills, diaphoresis.  Feels weak and  tired. HENT: Negative for congestion, nasal discharge, difficulty swallowing. Positive for occasional headaches.   Eyes: Negative for blurred vision, double vision and discharge.  Respiratory: Negative for wheezing. Positive for chronic cough with yellow phlegm and shortness of breath with exertion.    she uses oxygen at home on a needed basis. Cardiovascular: Negative for palpitations, leg swelling. Positive for chronic intermittent chest pain.   denies any chest pain at present. Gastrointestinal: Negative for heartburn,vomiting,loss of appetite, melena, diarrhea and constipation. Positive for nausea. Genitourinary: Negative for dysuria.  Musculoskeletal: Negative for back pain, fall in the facility.  Skin: Negative for itching, rash.  Neurological: Positive for dizziness. Psychiatric/Behavioral: Negative for depression.   Past Medical History:  Diagnosis Date  . AF (atrial fibrillation) ([place])   . Anxiety   . Arthritis    "thighs, hips, arms" (04/28/2015)  . Breast cancer ([place])    right mastectomy  . CAD (coronary artery disease)   . Dehydration 12/06/2016  . Dementia   . Depression   . Diverticulitis   . GERD (gastroesophageal reflux disease)   . HCAP (healthcare-associated pneumonia) 09/15/2015  . Hyperlipidemia   . Hypertension   . Non-small cell carcinoma of lung, stage 3 ([place]) 04/30/2015  . Prediabetes   . Seasonal allergies   . Shortness of breath dyspnea   . Sleep apnea     dx'd 10/2014,refused cpap machine  . Urinary frequency   . UTI (urinary tract infection) 06/06/2015   Past Surgical History:  Procedure Laterality Date  . BREAST SURGERY    . CARDIOVERSION    . CESAREAN SECTION  X 2  . COLONOSCOPY    .  COLONOSCOPY N/A 01/13/2017   Procedure: COLONOSCOPY;  Surgeon: Gatha Mayer, MD;  Location: [facility] ENDOSCOPY;  Service: Endoscopy;  Laterality: N/A;  . ESOPHAGOGASTRODUODENOSCOPY N/A 01/13/2017   Procedure: ESOPHAGOGASTRODUODENOSCOPY (EGD);  Surgeon: Gatha Mayer, MD;   Location: [facility];  Service: Endoscopy;  Laterality: N/A;  . LAPAROSCOPIC PARTIAL COLECTOMY N/A 02/08/2017   Procedure: LAPAROSCOPIC PARTIAL COLECTOMY;  Surgeon: Leighton Ruff, MD;  Location: [facility] ORS;  Service: General;  Laterality: N/A;  . MASTECTOMY Right   . MEDIASTINOSCOPY N/A 04/28/2015   Procedure: MEDIASTINOSCOPY;  Surgeon: [name], MD;  Location[facility] OR;  Service: Thoracic;  Laterality: N/A;  . TONSILLECTOMY     Social History:   reports that she quit smoking about 24 years ago. Her smoking use included Cigarettes. She has a 35.00 pack-year smoking history. She has never used smokeless tobacco. She reports that she drinks about 5.4 oz of alcohol per week . She reports that she does not use drugs.  Family History  Problem Relation Age of Onset  . Mental illness Mother     Medications: Allergies as of 02/17/2017   No Known Allergies     Medication List       Accurate as of 02/17/17 11:34 AM. Always use your most recent med list.          albuterol 108 (90 Base) MCG/ACT inhaler Commonly known as:  PROVENTIL HFA;VENTOLIN HFA Inhale 1 puff into the lungs every 6 (six) hours as needed for wheezing or shortness of breath. Reported on 06/01/2016   apixaban 5 MG Tabs tablet Commonly known as:  ELIQUIS Take 1 tablet (5 mg total) by mouth 2 (two) times daily. DO NOT TAKE AT THIS TIME - AWAIT FURTHER INSTRUCTIONS 01/13/2017   atorvastatin 40 MG tablet Commonly known as:  LIPITOR Take 1 tablet (40 mg total) by mouth daily.   buPROPion 100 MG 12 hr tablet Commonly known as:  WELLBUTRIN SR TAKE 1 TABLET(100 MG) BY MOUTH DAILY   CALCIUM-VITAMIN D PO Take 1 tablet by mouth daily.   carvedilol 3.125 MG tablet Commonly known as:  COREG TAKE 1 TABLET(3.125 MG) BY MOUTH TWICE DAILY   diphenhydramine-acetaminophen 25-500 MG Tabs tablet Commonly known as:  TYLENOL PM Take 1 tablet by mouth at bedtime. Reported on 06/01/2016   escitalopram 10 MG tablet Commonly known as:   LEXAPRO Take 1 tablet (10 mg total) by mouth daily. Yearly physical due in May must see Md for refills   fluticasone furoate-vilanterol 100-25 MCG/INH Aepb Commonly known as:  BREO ELLIPTA Inhale 1 puff into the lungs daily.   loperamide 2 MG tablet Commonly known as:  IMODIUM A-D Take 2-4 mg by mouth 4 (four) times daily as needed for diarrhea or loose stools.   loratadine 10 MG tablet Commonly known as:  CLARITIN TAKE 1 TABLET BY MOUTH AT BEDTIME   LORazepam 1 MG tablet Commonly known as:  ATIVAN Take 1 tablet (1 mg total) by mouth at bedtime. for anxiety   Melatonin 10 MG Caps Take 10 mg by mouth at bedtime.   multivitamin with minerals tablet Take 1 tablet by mouth daily.   NUTRITIONAL SUPPLEMENT Liqd Take 120 mLs by mouth 3 (three) times daily. MedPass   oxyCODONE 5 MG immediate release tablet Commonly known as:  Oxy IR/ROXICODONE Take 1-2 tablets (5-10 mg total) by mouth every 4 (four) hours as needed for severe pain.   OXYGEN Inhale 2 L into the lungs as needed.   pantoprazole 40 MG tablet  Commonly known as:  PROTONIX TAKE 1 TABLET BY MOUTH EVERY DAY       Immunizations: Immunization History  Administered Date(s) Administered  . Influenza, High Dose Seasonal PF 08/18/2015  . Influenza-Unspecified 09/28/2013  . PPD Test 09/17/2015  . Pneumococcal Conjugate-13 09/28/2013  . Pneumococcal Polysaccharide-23 04/29/2015     Physical Exam: Vitals:   02/17/17 1126  BP: 112/76  Pulse: 90  Resp: 16  Temp: 97 F (36.1 C)  TempSrc: Oral  SpO2: 95%  Weight: 187 lb (84.8 kg)  Height: '5\' 6"'$  (1.676 m)   Body mass index is 30.18 kg/m.  General- elderly female, Obese, chronically ill-appearing, in no acute distress Head- normocephalic, atraumatic Nose- no nasal discharge Throat- moist mucus membrane, has dentures, no mouth sores Eyes- PERRLA, EOMI, no pallor, no icterus, no discharge, normal conjunctiva, normal sclera Neck- no cervical  lymphadenopathy Cardiovascular- normal s1,s2, no murmur Respiratory- bilateral clear to auscultation, no wheeze, no rhonchi, no crackles, no use of accessory muscles Abdomen- bowel sounds present, soft, non tender, no guarding or rigidity Musculoskeletal- able to move all 4 extremities, no leg edema, arthritis changes to her fingers, generalized weakness Neurological- alert and oriented to person, place and time Skin- warm and dry, surgical incision to mid abdominal wall healing well with mild erythema, laparoscopic surgical incision to mid abdominal wall healing well Psychiatry- normal mood and affect    Labs reviewed: Basic Metabolic Panel:  Recent Labs  02/11/17 0812 02/13/17 0442 02/14/17 0532  NA 134* 139 139  K 3.7 3.4* 3.1*  CL 101 102 101  CO2 '24 29 30  '$ GLUCOSE 133* 98 111*  BUN '10 7 11  '$ CREATININE 0.85 0.78 0.80  CALCIUM 8.5* 9.0 9.0  MG  --  1.5*  --    Liver Function Tests:  Recent Labs  05/02/16 1119 11/29/16 1149  AST 13 13  ALT 11 8  ALKPHOS 59 58  BILITOT 0.35 0.42  PROT 6.9 7.1  ALBUMIN 3.3* 3.6   No results for input(s): LIPASE, AMYLASE in the last 8760 hours. No results for input(s): AMMONIA in the last 8760 hours. CBC:  Recent Labs  11/29/16 1149 12/06/16 1411 12/22/16 1547  02/11/17 0812 02/13/17 0442 02/14/17 0532 02/14/17 1144 02/15/17 0529  WBC 7.7 8.2 8.4  < > 12.5* 6.6 8.3  --   --   NEUTROABS 6.1 6.5 6.7  --   --   --   --   --   --   HGB 7.4* 7.0* 9.9*  < > 8.0* 6.7* 8.9* 8.9* 9.0*  HCT 25.4* 24.3* 31.2*  < > 26.3* 22.1* 29.8* 28.9* 29.4*  MCV 73.8* 72.8* 73.9*  < > 76.2* 76.2* 75.6*  --   --   PLT 385 452* 369.0  < > 386 366 408*  --   --   < > = values in this interval not displayed. Cardiac Enzymes: No results for input(s): CKTOTAL, CKMB, CKMBINDEX, TROPONINI in the last 8760 hours. BNP: Invalid input(s): POCBNP CBG:  Recent Labs  12/22/16 1058  GLUCAP 134*     Assessment/Plan  Generalized weakness From  physical deconditioning.Will have her work with physical therapy and occupational therapy team to help with gait training and muscle strengthening exercises.fall precautions. Skin care. Encourage to be out of bed.   Stage II colon cancer Status post laparoscopic partial colectomy. Will need follow-up with general surgery. Continue oxycodone IR 5 mg 1-2 tablet every 4 hours as needed for pain. Provide 1 care.  Adenocarcinoma  upper lobe right lung Followed by oncology. Continue breo-ellipta daily, oxygen on a needed basis, Proventil on a needed basis. Monitor her breathing.  Acute blood loss anemia Status post 1 unit packed red blood cell transfusion. Monitor CBC.  Dysphagia On mechanical soft diet, aspiration precautions to be taken.  Atrial fibrillation Currently heart rate is controlled. Continue eliquis 5 mg twice a day for stroke prophylaxis. Continue carvedilol 3.125 mg twice a day. Continue statin.  Gastroesophageal reflux disease On Protonix 40 mg daily, monitor clinically  Chronic depression Mood appears stable this visit. On Wellbutrin 100 mg daily and Lexapro 10 mg daily, no changes made.    Goals of care: short term rehabilitation   Labs/tests ordered: cbc, bmp  Family/ staff Communication: reviewed care plan with patient, her son in law and nursing supervisor  I have spent greater than 50 minutes for this encounter which includes reviewing hospital records, addressing above mentioned concerns, reviewing care plan with patient, answering patient's concerns and counseling her.     Blanchie Serve, MD Internal Medicine [facility] [address] [place], [place] [address] Cell Phone (Monday-Friday 8 am - 5 pm): [phone] On Call: [phone] and follow prompts after 5 pm and on weekends Office Phone: [phone] Office Fax: [phone]

## 2017-02-27 LAB — CBC AND DIFFERENTIAL
HCT: 32 % — AB (ref 36–46)
Hemoglobin: 9.5 g/dL — AB (ref 12.0–16.0)
Neutrophils Absolute: 6 /uL
Platelets: 628 10*3/uL — AB (ref 150–399)
WBC: 7.8 10^3/mL

## 2017-03-04 ENCOUNTER — Other Ambulatory Visit: Payer: Self-pay | Admitting: Internal Medicine

## 2017-03-09 ENCOUNTER — Non-Acute Institutional Stay (SKILLED_NURSING_FACILITY): Payer: Medicare HMO | Admitting: Adult Health

## 2017-03-09 ENCOUNTER — Encounter: Payer: Self-pay | Admitting: Adult Health

## 2017-03-09 DIAGNOSIS — F418 Other specified anxiety disorders: Secondary | ICD-10-CM

## 2017-03-09 DIAGNOSIS — E876 Hypokalemia: Secondary | ICD-10-CM

## 2017-03-09 DIAGNOSIS — D62 Acute posthemorrhagic anemia: Secondary | ICD-10-CM

## 2017-03-09 DIAGNOSIS — C182 Malignant neoplasm of ascending colon: Secondary | ICD-10-CM | POA: Diagnosis not present

## 2017-03-09 DIAGNOSIS — C3410 Malignant neoplasm of upper lobe, unspecified bronchus or lung: Secondary | ICD-10-CM

## 2017-03-09 DIAGNOSIS — K219 Gastro-esophageal reflux disease without esophagitis: Secondary | ICD-10-CM | POA: Diagnosis not present

## 2017-03-09 DIAGNOSIS — E785 Hyperlipidemia, unspecified: Secondary | ICD-10-CM

## 2017-03-09 DIAGNOSIS — I480 Paroxysmal atrial fibrillation: Secondary | ICD-10-CM

## 2017-03-09 DIAGNOSIS — G4700 Insomnia, unspecified: Secondary | ICD-10-CM | POA: Diagnosis not present

## 2017-03-09 DIAGNOSIS — J9611 Chronic respiratory failure with hypoxia: Secondary | ICD-10-CM

## 2017-03-09 NOTE — Progress Notes (Signed)
DATE:  03/09/2017   MRN:  [id number]  BIRTHDAY: 12/12/1934  Facility:  Nursing Home Location:  [place] Room Number: 193-X  LEVEL OF CARE:  SNF [phone])  Contact Information    Name [place] Daughter [phone]  [phone]   [facility] Daughter [phone]   [facility] Daughter   [phone]   Raynald Kemp Other [phone]  [phone]       Code Status History    Date Active Date Inactive Code Status Order ID Comments User Context   02/08/2017 12:39 PM 02/15/2017  7:35 PM Full Code [id number]  Leighton Ruff, MD Inpatient   09/13/2015  6:19 PM 09/17/2015  5:47 PM Full Code [id number]  Kelvin Cellar, MD Inpatient   06/22/2015  2:40 AM 06/26/2015  7:25 PM Full Code [id number]  Theressa Millard, MD Inpatient       Chief Complaint  Patient presents with  . Discharge Note    HISTORY OF PRESENT ILLNESS:  This is an 82-YO female who is for discharge home.  She was admitted to [facility] for short-term rehabilitation on 02/15/2017 following an admission at [facility] 02/08/2017-02/15/2017 for laparoscopic partial colectomy due to stage 2 colon cancer. She had an episode of anastomotic bleeding that required 1 unit blood transfusion. She had an episode of orthostatic hypotension and was resolved with gentle hydration.   She was being followed-up for lung cancer and underwent PET scan for evaluation of a lung lesion. A mass in the cecal region showed. Biopsy revealed invasive carcinoma.    Patient was admitted to this facility for short-term rehabilitation after the patient's recent hospitalization.  Patient has completed SNF rehabilitation and therapy has cleared the patient for discharge.    PAST MEDICAL HISTORY:  Past Medical History:  Diagnosis Date  . AF (atrial fibrillation) ([place])   . Anxiety   . Arthritis    "thighs, hips, arms" (04/28/2015)  . Breast cancer  ([place])    right mastectomy  . CAD (coronary artery disease)   . Dehydration 12/06/2016  . Dementia   . Depression   . Diverticulitis   . GERD (gastroesophageal reflux disease)   . HCAP (healthcare-associated pneumonia) 09/15/2015  . Hyperlipidemia   . Hypertension   . Non-small cell carcinoma of lung, stage 3 ([place]) 04/30/2015  . Prediabetes   . Seasonal allergies   . Shortness of breath dyspnea   . Sleep apnea     dx'd 10/2014,refused cpap machine  . Urinary frequency   . UTI (urinary tract infection) 06/06/2015     CURRENT MEDICATIONS: Reviewed  Patient's Medications  New Prescriptions   No medications on file  Previous Medications   ALBUTEROL (PROVENTIL HFA;VENTOLIN HFA) 108 (90 BASE) MCG/ACT INHALER    Inhale 1 puff into the lungs every 6 (six) hours as needed for wheezing or shortness of breath. Reported on 06/01/2016   APIXABAN (ELIQUIS) 5 MG TABS TABLET    Take 1 tablet (5 mg total) by mouth 2 (two) times daily. DO NOT TAKE AT THIS TIME - AWAIT FURTHER INSTRUCTIONS 01/13/2017   ATORVASTATIN (LIPITOR) 40 MG TABLET    TAKE 1 TABLET(40 MG) BY MOUTH DAILY   BUPROPION (WELLBUTRIN SR) 100 MG 12 HR TABLET    TAKE 1 TABLET(100 MG) BY MOUTH DAILY   CALCIUM-VITAMIN D PO    Take 1 tablet by mouth daily.   CARVEDILOL (COREG) 3.125 MG TABLET    TAKE 1 TABLET(3.125  MG) BY MOUTH TWICE DAILY   DIPHENHYDRAMINE-ACETAMINOPHEN (TYLENOL PM) 25-500 MG TABS TABLET    Take 1 tablet by mouth at bedtime. Reported on 06/01/2016   ESCITALOPRAM (LEXAPRO) 10 MG TABLET    Take 1 tablet (10 mg total) by mouth daily. Yearly physical due in May must see Md for refills   FLUTICASONE FUROATE-VILANTEROL (BREO ELLIPTA) 100-25 MCG/INH AEPB    Inhale 1 puff into the lungs daily.   LOPERAMIDE (IMODIUM A-D) 2 MG TABLET    Take 2-4 mg by mouth 4 (four) times daily as needed for diarrhea or loose stools.    LORATADINE (CLARITIN) 10 MG TABLET    TAKE 1 TABLET BY MOUTH AT BEDTIME   LORAZEPAM (ATIVAN) 1 MG TABLET    Take 1 tablet  (1 mg total) by mouth at bedtime. for anxiety   MELATONIN 10 MG CAPS    Take 10 mg by mouth at bedtime.    MULTIPLE VITAMINS-MINERALS (MULTIVITAMIN WITH MINERALS) TABLET    Take 1 tablet by mouth daily.   NUTRITIONAL SUPPLEMENT LIQD    Take 120 mLs by mouth 3 (three) times daily. MedPass   OXYCODONE (OXY IR/ROXICODONE) 5 MG IMMEDIATE RELEASE TABLET    Take 5 mg by mouth every 6 (six) hours as needed for severe pain.   OXYGEN    Inhale 2 L into the lungs as needed.   PANTOPRAZOLE (PROTONIX) 40 MG TABLET    TAKE 1 TABLET BY MOUTH EVERY DAY  Modified Medications   No medications on file  Discontinued Medications   OXYCODONE (OXY IR/ROXICODONE) 5 MG IMMEDIATE RELEASE TABLET    Take 1-2 tablets (5-10 mg total) by mouth every 4 (four) hours as needed for severe pain.     No Known Allergies   REVIEW OF SYSTEMS:  GENERAL: no change in appetite, no fatigue, no weight changes, no fever, chills or weakness EYES: Denies change in vision, dry eyes, eye pain, itching or discharge EARS: Denies change in hearing, ringing in ears, or earache NOSE: Denies nasal congestion or epistaxis MOUTH and THROAT: Denies oral discomfort, gingival pain or bleeding, pain from teeth or hoarseness   RESPIRATORY: no cough, SOB, DOE, wheezing, hemoptysis CARDIAC: no chest pain, edema or palpitations GI: no abdominal pain, diarrhea, constipation, heart burn, nausea or vomiting GU: Denies dysuria, frequency, hematuria, incontinence, or discharge PSYCHIATRIC: Denies feeling of depression or anxiety. No report of hallucinations, insomnia, paranoia, or agitation     PHYSICAL EXAMINATION  GENERAL APPEARANCE: Well nourished. In no acute distress. Obese SKIN:  Laparoscopic surgical site by the navel is healed HEAD: Normal in size and contour. No evidence of trauma EYES: Lids open and close normally. No blepharitis, entropion or ectropion. PERRL. Conjunctivae are clear and sclerae are white. Lenses are without  opacity EARS: Pinnae are normal. Patient hears normal voice tunes of the examiner MOUTH and THROAT: Lips are without lesions. Oral mucosa is moist and without lesions. Tongue is normal in shape, size, and color and without lesions NECK: supple, trachea midline, no neck masses, no thyroid tenderness, no thyromegaly LYMPHATICS: no LAN in the neck, no supraclavicular LAN RESPIRATORY: breathing is even & unlabored, BS CTAB, O2 @ 2L/min via [place] continuously CARDIAC: RRR, no murmur,no extra heart sounds, no edema GI: abdomen soft, normal BS, no masses, no tenderness, no hepatomegaly, no splenomegaly EXTREMITIES:  Able to move 4 extremities PSYCHIATRIC: Alert and oriented X 3. Affect and behavior are appropriate   LABS/RADIOLOGY: Labs reviewed: Basic Metabolic Panel:  Recent Labs  02/11/17 9735 02/13/17 0442 02/14/17 0532 02/17/17  NA 134* 139 139 140  K 3.7 3.4* 3.1* 4.2  CL 101 102 101  --   CO2 '24 29 30  '$ --   GLUCOSE 133* 98 111*  --   BUN '10 7 11 12  '$ CREATININE 0.85 0.78 0.80 0.7  CALCIUM 8.5* 9.0 9.0  --   MG  --  1.5*  --   --    Liver Function Tests:  Recent Labs  05/02/16 1119 11/29/16 1149  AST 13 13  ALT 11 8  ALKPHOS 59 58  BILITOT 0.35 0.42  PROT 6.9 7.1  ALBUMIN 3.3* 3.6   CBC:  Recent Labs  12/22/16 1547  02/11/17 0812 02/13/17 0442 02/14/17 0532  02/15/17 0529 02/17/17 02/27/17  WBC 8.4  < > 12.5* 6.6 8.3  --   --  9.7 7.8  NEUTROABS 6.7  --   --   --   --   --   --  8 6  HGB 9.9*  < > 8.0* 6.7* 8.9*  < > 9.0* 8.6* 9.5*  HCT 31.2*  < > 26.3* 22.1* 29.8*  < > 29.4* 29* 32*  MCV 73.9*  < > 76.2* 76.2* 75.6*  --   --   --   --   PLT 369.0  < > 386 366 408*  --   --  467* 628*  < > = values in this interval not displayed. CBG:  Recent Labs  12/22/16 1058  GLUCAP 134*      ASSESSMENT/PLAN:  Chronic respiratory failure with hypoxia - continue O2 @ 2L/min via [place] ontinuously  Colon cancer s/p lap partial colectomy - follow-up with Dr. Leighton Ruff, surgery, continue oxycodone 5 mg 1 tabs by mouth every 6 hours when necessary for pain  Paroxysmal atrial fibrillation - rate controlled; continue Eliquis 5 mg 1 tab by mouth twice a day, carvedilol 3.125 mg 1 tab by mouth twice a day  Depression with anxiety - mood is stable; continue Wellbutrin SR 100 mg 1 tab by mouth daily, Lexapro 10 mg 1 tab by mouth daily and Ativan 1 mg 1 tab by mouth daily at bedtime  GERD  - continue Protonix 40 mg 1 tab by mouth daily  Hyperlipidemia - continue atorvastatin 40 mg 1 tab by mouth daily  Insomnia - continue melatonin 5 mg give 2 tabs = 10 mg by mouth daily at bedtime  Anemia, acute blood loss - stable Lab Results  Component Value Date   HGB 9.5 (A) 02/27/2017   Hypokalemia - resolved Lab Results  Component Value Date   K 4.2 02/17/2017   Malignant neoplasm of upper lobe lung - follows up with oncology, continue Breo-Ellipta 100-25 mcg inhale 1 puff into the lungs daily and Proventil 90 g inhaler inhale 1 puff into the lungs every 6 hours when necessary     I have filled out patient's discharge paperwork and written prescriptions.    DME provided:  Portable O2 system and O2 concentrator  Total discharge time: Greater than 30 minutes  Discharge time involved coordination of the discharge process with Education officer, museum, nursing staff and therapy department. Medical justification for DME verified.    Monina C. Highland - NP    [employer] [phone]

## 2017-03-12 ENCOUNTER — Other Ambulatory Visit: Payer: Self-pay | Admitting: Family

## 2017-03-13 ENCOUNTER — Other Ambulatory Visit: Payer: Self-pay | Admitting: Adult Health

## 2017-03-27 ENCOUNTER — Ambulatory Visit (HOSPITAL_COMMUNITY)
Admission: RE | Admit: 2017-03-27 | Discharge: 2017-03-27 | Disposition: A | Discharge status: Home / Self Care | Payer: Medicare HMO | Source: Ambulatory Visit | Attending: Internal Medicine | Admitting: Internal Medicine

## 2017-03-27 ENCOUNTER — Encounter (HOSPITAL_COMMUNITY): Payer: Self-pay

## 2017-03-27 ENCOUNTER — Other Ambulatory Visit (HOSPITAL_BASED_OUTPATIENT_CLINIC_OR_DEPARTMENT_OTHER): Payer: Medicare HMO

## 2017-03-27 DIAGNOSIS — I2510 Atherosclerotic heart disease of native coronary artery without angina pectoris: Secondary | ICD-10-CM | POA: Insufficient documentation

## 2017-03-27 DIAGNOSIS — Z9889 Other specified postprocedural states: Secondary | ICD-10-CM | POA: Diagnosis not present

## 2017-03-27 DIAGNOSIS — Z9011 Acquired absence of right breast and nipple: Secondary | ICD-10-CM | POA: Insufficient documentation

## 2017-03-27 DIAGNOSIS — R918 Other nonspecific abnormal finding of lung field: Secondary | ICD-10-CM | POA: Diagnosis not present

## 2017-03-27 DIAGNOSIS — C3431 Malignant neoplasm of lower lobe, right bronchus or lung: Secondary | ICD-10-CM | POA: Diagnosis not present

## 2017-03-27 DIAGNOSIS — Z85118 Personal history of other malignant neoplasm of bronchus and lung: Secondary | ICD-10-CM | POA: Diagnosis not present

## 2017-03-27 DIAGNOSIS — J948 Other specified pleural conditions: Secondary | ICD-10-CM | POA: Diagnosis not present

## 2017-03-27 DIAGNOSIS — E279 Disorder of adrenal gland, unspecified: Secondary | ICD-10-CM | POA: Insufficient documentation

## 2017-03-27 DIAGNOSIS — C3411 Malignant neoplasm of upper lobe, right bronchus or lung: Secondary | ICD-10-CM

## 2017-03-27 DIAGNOSIS — I700 Atherosclerosis of aorta: Secondary | ICD-10-CM | POA: Diagnosis not present

## 2017-03-27 DIAGNOSIS — C3410 Malignant neoplasm of upper lobe, unspecified bronchus or lung: Secondary | ICD-10-CM

## 2017-03-27 LAB — CBC WITH DIFFERENTIAL/PLATELET
BASO%: 0.4 % (ref 0.0–2.0)
BASOS ABS: 0 10*3/uL (ref 0.0–0.1)
EOS ABS: 0.2 10*3/uL (ref 0.0–0.5)
EOS%: 2.1 % (ref 0.0–7.0)
HCT: 32.7 % — ABNORMAL LOW (ref 34.8–46.6)
HGB: 9.8 g/dL — ABNORMAL LOW (ref 11.6–15.9)
LYMPH%: 12 % — ABNORMAL LOW (ref 14.0–49.7)
MCH: 23.1 pg — AB (ref 25.1–34.0)
MCHC: 30 g/dL — ABNORMAL LOW (ref 31.5–36.0)
MCV: 76.9 fL — ABNORMAL LOW (ref 79.5–101.0)
MONO#: 0.5 10*3/uL (ref 0.1–0.9)
MONO%: 6.7 % (ref 0.0–14.0)
NEUT#: 6.3 10*3/uL (ref 1.5–6.5)
NEUT%: 78.8 % — AB (ref 38.4–76.8)
Platelets: 428 10*3/uL — ABNORMAL HIGH (ref 145–400)
RBC: 4.25 10*6/uL (ref 3.70–5.45)
RDW: 17.3 % — ABNORMAL HIGH (ref 11.2–14.5)
WBC: 8 10*3/uL (ref 3.9–10.3)
lymph#: 1 10*3/uL (ref 0.9–3.3)

## 2017-03-27 LAB — COMPREHENSIVE METABOLIC PANEL
ALT: 11 U/L (ref 0–55)
AST: 12 U/L (ref 5–34)
Albumin: 3.3 g/dL — ABNORMAL LOW (ref 3.5–5.0)
Alkaline Phosphatase: 74 U/L (ref 40–150)
Anion Gap: 10 mEq/L (ref 3–11)
BUN: 15.2 mg/dL (ref 7.0–26.0)
CALCIUM: 9.6 mg/dL (ref 8.4–10.4)
CHLORIDE: 105 meq/L (ref 98–109)
CO2: 23 meq/L (ref 22–29)
CREATININE: 0.9 mg/dL (ref 0.6–1.1)
EGFR: 63 mL/min/{1.73_m2} — ABNORMAL LOW (ref >90)
Glucose: 159 mg/dl — ABNORMAL HIGH (ref 70–140)
POTASSIUM: 4.4 meq/L (ref 3.5–5.1)
SODIUM: 139 meq/L (ref 136–145)
Total Bilirubin: 0.38 mg/dL (ref 0.20–1.20)
Total Protein: 7.1 g/dL (ref 6.4–8.3)

## 2017-03-27 MED ORDER — IOPAMIDOL (ISOVUE-300) INJECTION 61%
INTRAVENOUS | Status: AC
  Administered 2017-03-27: 75 mL
  Filled 2017-03-27: qty 75

## 2017-04-04 ENCOUNTER — Encounter: Payer: Self-pay | Admitting: Internal Medicine

## 2017-04-04 ENCOUNTER — Telehealth: Payer: Self-pay | Admitting: Internal Medicine

## 2017-04-04 ENCOUNTER — Ambulatory Visit (HOSPITAL_BASED_OUTPATIENT_CLINIC_OR_DEPARTMENT_OTHER): Payer: Medicare HMO | Admitting: Internal Medicine

## 2017-04-04 VITALS — BP 132/55 | HR 95 | Temp 98.0°F | Resp 21 | Ht 66.0 in | Wt 177.9 lb

## 2017-04-04 DIAGNOSIS — C182 Malignant neoplasm of ascending colon: Secondary | ICD-10-CM

## 2017-04-04 DIAGNOSIS — R0602 Shortness of breath: Secondary | ICD-10-CM

## 2017-04-04 DIAGNOSIS — C3411 Malignant neoplasm of upper lobe, right bronchus or lung: Secondary | ICD-10-CM | POA: Diagnosis not present

## 2017-04-04 DIAGNOSIS — D509 Iron deficiency anemia, unspecified: Secondary | ICD-10-CM

## 2017-04-04 NOTE — Progress Notes (Signed)
[place] [phone]   [phone]  OFFICE PROGRESS NOTE  [place], FNP [place] [address]  DIAGNOSIS:  1) Stage IIIA (T1a, N2, M0) non-small cell lung cancer Non-small cell carcinoma of lung, adenocarcinoma presented with right upper lobe lung mass and mediastinal lymphadenopathy diagnosed in May 2016. 2) stage II (T3, N0, MX) well-differentiated invasive adenocarcinoma diagnosed in February 2018.   PRIOR THERAPY:  1) Concurrent chemoradiation with chemotherapy in the form of weekly carboplatin for an AUC of 2 and paclitaxel at 45 mg/m given concurrent with radiation. Last dose was given 06/18/2015 with partial response. 2) status post laparoscopic partial colectomy for ascending colon cancer under the care of Dr. Marcello Moores on 02/08/2017.  CURRENT THERAPY: Observation.  INTERVAL HISTORY: Pamela Mcguire 82 y.o. female returns to the clinic today for follow-up visit accompanied by her daughter. The patient is feeling fine today was no specific complaints except for fatigue. She was recently diagnosed with adenocarcinoma of the ascending colon and the patient underwent partial colectomy with lymph node dissection under the care of Dr. Marcello Moores. She is recovering well from her surgery except for the fatigue secondary to anemia. She denied having any chest pain but continues to have shortness breath with exertion with no cough or hemoptysis. She denied having any fever or chills. She has no nausea, vomiting, diarrhea or constipation. She is here today for evaluation with repeat CT scan of the chest for restaging of her disease.   MEDICAL HISTORY: Past Medical History:  Diagnosis Date  . AF (atrial fibrillation) ([place])   . Anxiety   . Arthritis    "thighs, hips, arms" (04/28/2015)  . Breast cancer ([place])    right mastectomy  . CAD (coronary artery disease)   . Dehydration 12/06/2016  . Dementia   . Depression   . Diverticulitis     . GERD (gastroesophageal reflux disease)   . HCAP (healthcare-associated pneumonia) 09/15/2015  . Hyperlipidemia   . Hypertension   . Non-small cell carcinoma of lung, stage 3 ([place]) 04/30/2015  . Prediabetes   . Seasonal allergies   . Shortness of breath dyspnea   . Sleep apnea     dx'd 10/2014,refused cpap machine  . Urinary frequency   . UTI (urinary tract infection) 06/06/2015    ALLERGIES:  has No Known Allergies.  MEDICATIONS:  Current Outpatient Prescriptions  Medication Sig Dispense Refill  . albuterol (PROVENTIL HFA;VENTOLIN HFA) 108 (90 Base) MCG/ACT inhaler Inhale 1 puff into the lungs every 6 (six) hours as needed for wheezing or shortness of breath. Reported on 06/01/2016 18 g 1  . apixaban (ELIQUIS) 5 MG TABS tablet Take 1 tablet (5 mg total) by mouth 2 (two) times daily. DO NOT TAKE AT THIS TIME - AWAIT FURTHER INSTRUCTIONS 01/13/2017 180 tablet 2  . atorvastatin (LIPITOR) 40 MG tablet TAKE 1 TABLET(40 MG) BY MOUTH DAILY 90 tablet 0  . buPROPion (WELLBUTRIN SR) 100 MG 12 hr tablet TAKE 1 TABLET(100 MG) BY MOUTH DAILY 90 tablet 0  . CALCIUM-VITAMIN D PO Take 1 tablet by mouth daily.    . carvedilol (COREG) 3.125 MG tablet TAKE 1 TABLET(3.125 MG) BY MOUTH TWICE DAILY 180 tablet 2  . diphenhydramine-acetaminophen (TYLENOL PM) 25-500 MG TABS tablet Take 1 tablet by mouth at bedtime. Reported on 06/01/2016    . escitalopram (LEXAPRO) 10 MG tablet Take 1 tablet (10 mg total) by mouth daily. Yearly physical due in May must see  [place] for refills 90 tablet 0  . fluticasone furoate-vilanterol (BREO ELLIPTA) 100-25 MCG/INH AEPB Inhale 1 puff into the lungs daily. 28 each 3  . loperamide (IMODIUM A-D) 2 MG tablet Take 2-4 mg by mouth 4 (four) times daily as needed for diarrhea or loose stools.     Marland Kitchen loratadine (CLARITIN) 10 MG tablet TAKE 1 TABLET BY MOUTH AT BEDTIME 90 tablet 0  . LORazepam (ATIVAN) 1 MG tablet Take 1 tablet (1 mg total) by mouth at bedtime. for anxiety 30 tablet 1  .  Melatonin 10 MG CAPS Take 10 mg by mouth at bedtime.     . Multiple Vitamins-Minerals (MULTIVITAMIN WITH MINERALS) tablet Take 1 tablet by mouth daily.    Marland Kitchen NUTRITIONAL SUPPLEMENT LIQD Take 120 mLs by mouth 3 (three) times daily. MedPass    . oxyCODONE (OXY IR/ROXICODONE) 5 MG immediate release tablet Take 5 mg by mouth every 6 (six) hours as needed for severe pain.    . OXYGEN Inhale 2 L into the lungs as needed.    . pantoprazole (PROTONIX) 40 MG tablet TAKE 1 TABLET BY MOUTH EVERY DAY 90 tablet 0   No current facility-administered medications for this visit.     SURGICAL HISTORY:  Past Surgical History:  Procedure Laterality Date  . BREAST SURGERY    . CARDIOVERSION    . CESAREAN SECTION  X 2  . COLONOSCOPY    . COLONOSCOPY N/A 01/13/2017   Procedure: COLONOSCOPY;  Surgeon: Gatha Mayer, MD;  Location: [facility];  Service: Endoscopy;  Laterality: N/A;  . ESOPHAGOGASTRODUODENOSCOPY N/A 01/13/2017   Procedure: ESOPHAGOGASTRODUODENOSCOPY (EGD);  Surgeon: Gatha Mayer, MD;  Location: [facility];  Service: Endoscopy;  Laterality: N/A;  . LAPAROSCOPIC PARTIAL COLECTOMY N/A 02/08/2017   Procedure: LAPAROSCOPIC PARTIAL COLECTOMY;  Surgeon: Leighton Ruff, MD;  Location: [facility] ORS;  Service: General;  Laterality: N/A;  . MASTECTOMY Right   . MEDIASTINOSCOPY N/A 04/28/2015   Procedure: MEDIASTINOSCOPY;  Surgeon: [name], MD;  Location: [facility] OR;  Service: Thoracic;  Laterality: N/A;  . TONSILLECTOMY      REVIEW OF SYSTEMS:  Constitutional: positive for fatigue Eyes: negative Ears, nose, mouth, throat, and face: negative Respiratory: positive for dyspnea on exertion Cardiovascular: negative Gastrointestinal: negative Genitourinary:negative Integument/breast: negative Hematologic/lymphatic: negative Musculoskeletal:negative Neurological: negative Behavioral/Psych: negative Endocrine: negative Allergic/Immunologic: negative   PHYSICAL EXAMINATION: General appearance: alert,  cooperative, fatigued and no distress Head: Normocephalic, without obvious abnormality, atraumatic Neck: no adenopathy, no JVD, supple, symmetrical, trachea midline and thyroid not enlarged, symmetric, no tenderness/mass/nodules Lymph nodes: Cervical, supraclavicular, and axillary nodes normal. Resp: wheezes bilaterally Back: symmetric, no curvature. ROM normal. No CVA tenderness. Cardio: regular rate and rhythm, S1, S2 normal, no murmur, click, rub or gallop GI: soft, non-tender; bowel sounds normal; no masses,  no organomegaly Extremities: extremities normal, atraumatic, no cyanosis or edema Neurologic: Alert and oriented X 3, normal strength and tone. Normal symmetric reflexes. Normal coordination and gait  ECOG PERFORMANCE STATUS: 2 - Symptomatic, <50% confined to bed  Blood pressure (!) 132/55, pulse 95, temperature 98 F (36.7 C), temperature source Oral, resp. rate (!) 21, height '5\' 6"'$  (1.676 m), weight 177 lb 14.4 oz (80.7 kg), SpO2 96 %.  LABORATORY DATA: Lab Results  Component Value Date   WBC 8.0 03/27/2017   HGB 9.8 (L) 03/27/2017   HCT 32.7 (L) 03/27/2017   MCV 76.9 (L) 03/27/2017   PLT 428 (H) 03/27/2017      Chemistry  Component Value Date/Time   NA 139 03/27/2017 1139   K 4.4 03/27/2017 1139   CL 101 02/14/2017 0532   CO2 23 03/27/2017 1139   BUN 15.2 03/27/2017 1139   CREATININE 0.9 03/27/2017 1139   GLU 123 02/17/2017      Component Value Date/Time   CALCIUM 9.6 03/27/2017 1139   ALKPHOS 74 03/27/2017 1139   AST 12 03/27/2017 1139   ALT 11 03/27/2017 1139   BILITOT 0.38 03/27/2017 1139       RADIOGRAPHIC STUDIES: Ct Chest W Contrast  Result Date: 03/27/2017 CLINICAL DATA:  Right lung cancer. Chemotherapy and radiation therapy complete in 2016. Right frontal chest pain. EXAM: CT CHEST WITH CONTRAST TECHNIQUE: Multidetector CT imaging of the chest was performed during intravenous contrast administration. CONTRAST:  79m ISOVUE-300 IOPAMIDOL  (ISOVUE-300) INJECTION 61% COMPARISON:  PET 12/22/2016 and CT chest 11/29/2016. FINDINGS: Cardiovascular: Atherosclerotic calcification of the arterial vasculature, including coronary arteries. Heart is at the upper limits of normal in size to mildly enlarged. Mediastinum/Nodes: Mediastinal lymph nodes measure up to 11 mm in the right paratracheal station, similar. No hilar or axillary adenopathy. Surgical clips in the right axilla. Esophagus is grossly unremarkable. Small hiatal hernia. Lungs/Pleura: Collapse/consolidation, bronchiectasis and architectural distortion are seen in the apex and medial right hemithorax, as before, indicative of radiation therapy. Mild basilar predominant subpleural reticulation, as before. Oblong nodule in the anterior right lower lobe measures 6 x 11 mm (series 7, image 83), previously 10 x 15 mm. Tiny amount of scattered loculated pleural fluid in the right hemithorax. Mucoid impaction and bronchiectasis in the lingula. Airway is unremarkable. Upper Abdomen: Visualized portions of the liver and right adrenal gland are unremarkable. 11 mm left adrenal nodule is stable in size from 04/10/2015. Low-attenuation lesions in the visualized portion of the right kidney measure up to 1.9 cm, incompletely visualized. Subcentimeter low-attenuation lesion in the left kidney is too small to characterize but statistically, a cyst is most likely. Subcentimeter low-attenuation lesion in the spleen is similar. Visualized portions of the pancreas, stomach and bowel are grossly unremarkable with exception of a small hiatal hernia. No upper abdominal adenopathy. Musculoskeletal: No worrisome lytic or sclerotic lesions. Degenerative changes are seen in the spine. Lower thoracic or upper lumbar compression fracture is unchanged. Right mastectomy. IMPRESSION: 1. Right lower lobe nodule has decreased slightly in size in the interval. 2. Radiation changes in the right hemithorax with a tiny amount of  scattered residual loculated pleural fluid. 3. Basilar dependent subpleural reticulation, indicative of nonspecific interstitial pneumonitis. 4. Aortic atherosclerosis (ICD10-170.0). Coronary artery calcification. 5. Left adrenal nodule is unchanged from 03/11/2015, favoring an adenoma. Electronically Signed   By: MLorin PicketM.D.   On: 03/27/2017 15:02    ASSESSMENT AND PLAN:  This is a very pleasant 82years old white female with: 1) stage IIIa non-small cell lung cancer, adenocarcinoma status post concurrent chemoradiation with partial response and she was not taking good candidate for consolidation chemotherapy. The patient is currently on observation. She had repeat CT scan of the chest performed recently. I personally and independently reviewed the scan and discuss the results with the patient and her daughter. Her scan showed no evidence for disease progression. I recommended for the patient to continue on observation for now. 2) stage II (T3, N0, Mx) colon adenocarcinoma status post partial colectomy with lymph node dissection. The patient is recovering well from her surgery. She is not a good candidate for adjuvant therapy based on her stage and  performance status. I recommended for the patient to continue on observation for now. 3) iron deficiency anemia: Secondary to recent diagnosis with colon adenocarcinoma. I recommended for the patient to start taking over-the-counter oral iron tablets 1-2 tablets every day with vitamin C or orange juice. I will see the patient back for follow-up visit in 3 months for reevaluation with repeat CT scan of the chest, abdomen and pelvis for restaging of her disease. She was advised to call immediately if she has any concerning symptoms in the interval. The patient voices understanding of current disease status and treatment options and is in agreement with the current care plan. All questions were answered. The patient knows to call the clinic with any  problems, questions or concerns. We can certainly see the patient much sooner if necessary.  Disclaimer: This note was dictated with voice recognition software. Similar sounding words can inadvertently be transcribed and may not be corrected upon review.

## 2017-04-04 NOTE — Telephone Encounter (Signed)
Appointments scheduled per 04/04/17 LOS. Patient given AVS report and calendars with future scheduled appointments.

## 2017-04-18 ENCOUNTER — Encounter: Payer: Self-pay | Admitting: Family

## 2017-04-18 ENCOUNTER — Ambulatory Visit (INDEPENDENT_AMBULATORY_CARE_PROVIDER_SITE_OTHER): Payer: Medicare HMO | Admitting: Family

## 2017-04-18 ENCOUNTER — Other Ambulatory Visit: Payer: Self-pay | Admitting: Family

## 2017-04-18 VITALS — BP 124/70 | HR 92 | Temp 98.2°F | Resp 14 | Ht 66.0 in | Wt 178.0 lb

## 2017-04-18 DIAGNOSIS — C3411 Malignant neoplasm of upper lobe, right bronchus or lung: Secondary | ICD-10-CM

## 2017-04-18 DIAGNOSIS — H538 Other visual disturbances: Secondary | ICD-10-CM

## 2017-04-18 DIAGNOSIS — R350 Frequency of micturition: Secondary | ICD-10-CM | POA: Diagnosis not present

## 2017-04-18 DIAGNOSIS — G4701 Insomnia due to medical condition: Secondary | ICD-10-CM | POA: Diagnosis not present

## 2017-04-18 MED ORDER — LORAZEPAM 2 MG PO TABS: 2.0000 mg | ORAL_TABLET | Freq: Every evening | ORAL | 0 refills | Status: DC | PRN

## 2017-04-18 MED ORDER — OXYBUTYNIN CHLORIDE 5 MG PO TABS: 2.5000 mg | ORAL_TABLET | Freq: Two times a day (BID) | ORAL | 0 refills | Status: DC

## 2017-04-18 MED ORDER — OXYCODONE HCL 5 MG PO TABS: 2.5000 mg | ORAL_TABLET | Freq: Four times a day (QID) | ORAL | 0 refills | Status: DC | PRN

## 2017-04-18 NOTE — Progress Notes (Signed)
Subjective:    Patient ID: Pamela Mcguire, female    DOB: [birth year], 82 y.o.   MRN: [id number]  Chief Complaint  Patient presents with  . Follow-up    refill on ativan and pain meds, trouble with sleep, referral to eye dr.     HPI:  Pamela Mcguire is a 82 y.o. female who  has a past medical history of AF (atrial fibrillation) ([place]); Anxiety; Arthritis; Breast cancer ([place]); CAD (coronary artery disease); Dehydration (12/06/2016); Dementia; Depression; Diverticulitis; GERD (gastroesophageal reflux disease); HCAP (healthcare-associated pneumonia) (09/15/2015); Hyperlipidemia; Hypertension; Non-small cell carcinoma of lung, stage 3 (HCC) (04/30/2015); Prediabetes; Seasonal allergies; Shortness of breath dyspnea; Sleep apnea; Urinary frequency; and UTI (urinary tract infection) (06/06/2015). and presents today for a follow up office visit.   1.) Blurred vision - This is a new problem. Associated symptom of blurred vision has been waxing and waning for about 6 months. Denies any eye pain or headaches. No recent changes in vision. Requesting referral to opthalmology.  2.) Sleep - Currently maintain on lorazepam. Reports taking the medication as prescribed and denies adverse side effects. She is able to fall asleep but notes that she is up to go to the bathroom at least 3 times per evening. She does sleep during the day as well. Not currently using CPAP because of increased levels of anxiety.   3.) Urinary frequency - Continues to experience the associated symptoms of urinary frequency. Denies dysuria or other urinary symptoms. Severity of her symptoms is enough to wake her up at night. Does not current use CPAP as above noted.    No Known Allergies    Outpatient Medications Prior to Visit  Medication Sig Dispense Refill  . albuterol (PROVENTIL HFA;VENTOLIN HFA) 108 (90 Base) MCG/ACT inhaler Inhale 1 puff into the lungs every 6 (six) hours as needed for wheezing or shortness of breath. Reported on  06/01/2016 18 g 1  . apixaban (ELIQUIS) 5 MG TABS tablet Take 1 tablet (5 mg total) by mouth 2 (two) times daily. DO NOT TAKE AT THIS TIME - AWAIT FURTHER INSTRUCTIONS 01/13/2017 180 tablet 2  . atorvastatin (LIPITOR) 40 MG tablet TAKE 1 TABLET(40 MG) BY MOUTH DAILY 90 tablet 0  . buPROPion (WELLBUTRIN SR) 100 MG 12 hr tablet TAKE 1 TABLET(100 MG) BY MOUTH DAILY 90 tablet 0  . CALCIUM-VITAMIN D PO Take 1 tablet by mouth daily.    . carvedilol (COREG) 3.125 MG tablet TAKE 1 TABLET(3.125 MG) BY MOUTH TWICE DAILY 180 tablet 2  . escitalopram (LEXAPRO) 10 MG tablet Take 1 tablet (10 mg total) by mouth daily. Yearly physical due in May must see Md for refills 90 tablet 0  . fluticasone furoate-vilanterol (BREO ELLIPTA) 100-25 MCG/INH AEPB Inhale 1 puff into the lungs daily. 28 each 3  . loperamide (IMODIUM A-D) 2 MG tablet Take 2-4 mg by mouth 4 (four) times daily as needed for diarrhea or loose stools.     Marland Kitchen loratadine (CLARITIN) 10 MG tablet TAKE 1 TABLET BY MOUTH AT BEDTIME 90 tablet 0  . Multiple Vitamins-Minerals (MULTIVITAMIN WITH MINERALS) tablet Take 1 tablet by mouth daily.    Marland Kitchen NUTRITIONAL SUPPLEMENT LIQD Take 120 mLs by mouth 3 (three) times daily. [employer]    . OXYGEN Inhale 2 L into the lungs as needed.    . pantoprazole (PROTONIX) 40 MG tablet TAKE 1 TABLET BY MOUTH EVERY DAY 90 tablet 0  . diphenhydramine-acetaminophen (TYLENOL PM) 25-500 MG TABS tablet Take 1 tablet by mouth at bedtime.  Reported on 06/01/2016    . LORazepam (ATIVAN) 1 MG tablet Take 1 tablet (1 mg total) by mouth at bedtime. for anxiety 30 tablet 1  . oxyCODONE (OXY IR/ROXICODONE) 5 MG immediate release tablet Take 5 mg by mouth every 6 (six) hours as needed for severe pain.     No facility-administered medications prior to visit.       Past Surgical History:  Procedure Laterality Date  . BREAST SURGERY    . CARDIOVERSION    . CESAREAN SECTION  X 2  . COLON SURGERY    . COLONOSCOPY    . COLONOSCOPY N/A 01/13/2017     Procedure: COLONOSCOPY;  Surgeon: Gatha Mayer, MD;  Location: [facility];  Service: Endoscopy;  Laterality: N/A;  . ESOPHAGOGASTRODUODENOSCOPY N/A 01/13/2017   Procedure: ESOPHAGOGASTRODUODENOSCOPY (EGD);  Surgeon: Gatha Mayer, MD;  Location: [facility];  Service: Endoscopy;  Laterality: N/A;  . LAPAROSCOPIC PARTIAL COLECTOMY N/A 02/08/2017   Procedure: LAPAROSCOPIC PARTIAL COLECTOMY;  Surgeon: Leighton Ruff, MD;  Location: [facility];  Service: General;  Laterality: N/A;  . MASTECTOMY Right   . MEDIASTINOSCOPY N/A 04/28/2015   Procedure: MEDIASTINOSCOPY;  Surgeon: [name], MD;  Location: [facility] OR;  Service: Thoracic;  Laterality: N/A;  . TONSILLECTOMY        Past Medical History:  Diagnosis Date  . AF (atrial fibrillation) ([place])   . Anxiety   . Arthritis    "thighs, hips, arms" (04/28/2015)  . Breast cancer ([place])    right mastectomy  . CAD (coronary artery disease)   . Dehydration 12/06/2016  . Dementia   . Depression   . Diverticulitis   . GERD (gastroesophageal reflux disease)   . HCAP (healthcare-associated pneumonia) 09/15/2015  . Hyperlipidemia   . Hypertension   . Non-small cell carcinoma of lung, stage 3 ([place]) 04/30/2015  . Prediabetes   . Seasonal allergies   . Shortness of breath dyspnea   . Sleep apnea     dx'd 10/2014,refused cpap machine  . Urinary frequency   . UTI (urinary tract infection) 06/06/2015      Review of Systems  Constitutional: Negative for chills and fever.  Cardiovascular: Positive for chest pain.  Genitourinary: Positive for frequency. Negative for dysuria, flank pain, hematuria and urgency.  Psychiatric/Behavioral: Positive for sleep disturbance. Negative for behavioral problems, decreased concentration, dysphoric mood, self-injury and suicidal ideas. The patient is not nervous/anxious and is not hyperactive.       Objective:    BP 124/70 (BP Location: Left Arm, Patient Position: Sitting, Cuff Size: Large)   Pulse 92   Temp 98.2  F (36.8 C) (Oral)   Resp 14   Ht '5\' 6"'$  (1.676 m)   Wt 178 lb (80.7 kg)   SpO2 97%   BMI 28.73 kg/m  Nursing note and vital signs reviewed.  Physical Exam  Constitutional: She is oriented to person, place, and time. She appears well-developed and well-nourished. No distress.  Cardiovascular: Normal rate, regular rhythm, normal heart sounds and intact distal pulses.   Pulmonary/Chest: Effort normal and breath sounds normal.  Neurological: She is alert and oriented to person, place, and time.  Skin: Skin is warm and dry.  Psychiatric: She has a normal mood and affect. Her behavior is normal. Judgment and thought content normal.       Assessment & Plan:   Problem List Items Addressed This Visit      Respiratory   Adenocarcinoma of upper lobe of right lung  Continues to experience chest pain with timing of symptoms worsen the evenings and currently maintained on oxycodone. Denies constipation. Encouraged to avoid taking oxycodone close to bed when she uses medication to help her sleep. Symptoms are adequately controlled current medication regimen. [place] controlled substance database reviewed with no irregularities.      Relevant Medications   LORazepam (ATIVAN) 2 MG tablet     Other   Insomnia due to medical condition - Primary    Insomnia currently maintained on lorazepam and remains labile with current medication regimen. Increase lorazepam. Encouraged to use good sleep hygiene and consider additional melatonin. Discussed risks associated with continued benzodiazepine use. Patient wishes to continue with therapy.      Urinary frequency    Symptoms of urinary frequency with concern for overactive bladder and sleep apnea as controlling factor. Start low-dose oxybutynin. Encouraged decreased fluid intake near bed. Follow-up if symptoms worsen or do not improve.      Blurred vision    New-onset blurred vision with no other significant findings. Refer to ophthalmology  per patient request.          I have discontinued Ms. Hassinger's diphenhydramine-acetaminophen. I have also changed her oxyCODONE and LORazepam. Additionally, I am having her maintain her carvedilol, albuterol, fluticasone furoate-vilanterol, buPROPion, loperamide, CALCIUM-VITAMIN D PO, apixaban, loratadine, escitalopram, NUTRITIONAL SUPPLEMENT, multivitamin with minerals, OXYGEN, atorvastatin, and pantoprazole.   Meds ordered this encounter  Medications  . oxyCODONE (OXY IR/ROXICODONE) 5 MG immediate release tablet    Sig: Take 0.5-1 tablets (2.5-5 mg total) by mouth every 6 (six) hours as needed for severe pain.    Dispense:  30 tablet    Refill:  0    Order Specific Question:   Supervising Provider    Answer:   Pricilla Holm A [[id number]]  . DISCONTD: oxybutynin (DITROPAN) 5 MG tablet    Sig: Take 0.5 tablets (2.5 mg total) by mouth 2 (two) times daily.    Dispense:  30 tablet    Refill:  0    Order Specific Question:   Supervising Provider    Answer:   Pricilla Holm A [[id number]]  . LORazepam (ATIVAN) 2 MG tablet    Sig: Take 1 tablet (2 mg total) by mouth at bedtime as needed for anxiety. for anxiety    Dispense:  30 tablet    Refill:  0    Order Specific Question:   Supervising Provider    Answer:   Pricilla Holm A [[id number]]     Follow-up: Return in about 1 month (around 05/19/2017), or if symptoms worsen or fail to improve.  Mauricio Po, FNP

## 2017-04-18 NOTE — Patient Instructions (Addendum)
Thank you for choosing [facility].  SUMMARY AND INSTRUCTIONS:  Please continue to take your medications as prescribed.  We have increased your lorazepam to 2 mg nightly.  Continue to use the oxycodone as needed for pain - avoid using with lorazepam  Start the oxybutinin 2x daily.   Follow up in 1 month or sooner.    Medication:  Your prescription(s) have been submitted to your pharmacy or been printed and provided for you. Please take as directed and contact our office if you believe you are having problem(s) with the medication(s) or have any questions.  Follow up:  If your symptoms worsen or fail to improve, please contact our office for further instruction, or in case of emergency go directly to the emergency room at the closest medical facility.

## 2017-04-18 NOTE — Assessment & Plan Note (Signed)
New-onset blurred vision with no other significant findings. Refer to ophthalmology per patient request.

## 2017-04-18 NOTE — Assessment & Plan Note (Signed)
Symptoms of urinary frequency with concern for overactive bladder and sleep apnea as controlling factor. Start low-dose oxybutynin. Encouraged decreased fluid intake near bed. Follow-up if symptoms worsen or do not improve.

## 2017-04-18 NOTE — Assessment & Plan Note (Signed)
Insomnia currently maintained on lorazepam and remains labile with current medication regimen. Increase lorazepam. Encouraged to use good sleep hygiene and consider additional melatonin. Discussed risks associated with continued benzodiazepine use. Patient wishes to continue with therapy.

## 2017-04-18 NOTE — Assessment & Plan Note (Signed)
Continues to experience chest pain with timing of symptoms worsen the evenings and currently maintained on oxycodone. Denies constipation. Encouraged to avoid taking oxycodone close to bed when she uses medication to help her sleep. Symptoms are adequately controlled current medication regimen. [place] controlled substance database reviewed with no irregularities.

## 2017-04-29 ENCOUNTER — Other Ambulatory Visit: Payer: Self-pay | Admitting: Internal Medicine

## 2017-05-01 NOTE — Addendum Note (Signed)
Addendum  created 05/01/17 1223 by Myrtie Soman, MD   Sign clinical note

## 2017-05-08 ENCOUNTER — Telehealth: Payer: Self-pay | Admitting: Family

## 2017-05-08 DIAGNOSIS — R350 Frequency of micturition: Secondary | ICD-10-CM

## 2017-05-08 NOTE — Telephone Encounter (Signed)
Referral placed.

## 2017-05-08 NOTE — Telephone Encounter (Signed)
Talked with pt's daughter [name] and she feels that pt should have a referral for urologist for her urinary frequency. Please advise

## 2017-05-08 NOTE — Telephone Encounter (Signed)
Referral faxed to [facility]

## 2017-05-14 ENCOUNTER — Other Ambulatory Visit: Payer: Self-pay | Admitting: Adult Health

## 2017-05-14 ENCOUNTER — Other Ambulatory Visit: Payer: Self-pay | Admitting: Family

## 2017-05-15 ENCOUNTER — Other Ambulatory Visit: Payer: Self-pay | Admitting: Family

## 2017-05-15 ENCOUNTER — Other Ambulatory Visit: Payer: Self-pay | Admitting: Adult Health

## 2017-05-16 ENCOUNTER — Telehealth: Payer: Self-pay | Admitting: Family

## 2017-05-16 NOTE — Telephone Encounter (Signed)
Pt daughter called in and needs a refill on   Wellburtrin  Lorazepam   Pharmacy [facility] [address]

## 2017-05-17 ENCOUNTER — Other Ambulatory Visit: Payer: Self-pay | Admitting: *Deleted

## 2017-05-17 MED ORDER — LORAZEPAM 2 MG PO TABS: 2.0000 mg | ORAL_TABLET | Freq: Every evening | ORAL | 0 refills | Status: DC | PRN

## 2017-05-17 MED ORDER — BUPROPION HCL ER (SR) 100 MG PO TB12: ORAL_TABLET | ORAL | 0 refills | Status: DC

## 2017-05-17 MED ORDER — FLUTICASONE FUROATE-VILANTEROL 100-25 MCG/INH IN AEPB: 1.0000 | INHALATION_SPRAY | Freq: Every day | RESPIRATORY_TRACT | 3 refills | Status: DC

## 2017-05-17 NOTE — Telephone Encounter (Signed)
Medications refilled

## 2017-05-17 NOTE — Telephone Encounter (Signed)
Routing to greg, pleaSe advise, thanks

## 2017-06-10 ENCOUNTER — Other Ambulatory Visit: Payer: Self-pay | Admitting: Family

## 2017-06-19 ENCOUNTER — Other Ambulatory Visit: Payer: Self-pay | Admitting: Family

## 2017-06-19 NOTE — Telephone Encounter (Signed)
Please advise thanks.

## 2017-06-20 ENCOUNTER — Telehealth: Payer: Self-pay | Admitting: Family

## 2017-06-20 NOTE — Telephone Encounter (Signed)
Error

## 2017-06-27 ENCOUNTER — Other Ambulatory Visit: Payer: Self-pay | Admitting: Adult Health

## 2017-06-27 DIAGNOSIS — F32A Depression, unspecified: Secondary | ICD-10-CM

## 2017-06-27 DIAGNOSIS — F329 Major depressive disorder, single episode, unspecified: Secondary | ICD-10-CM

## 2017-06-30 ENCOUNTER — Telehealth: Payer: Self-pay | Admitting: Family

## 2017-06-30 ENCOUNTER — Other Ambulatory Visit: Payer: Self-pay | Admitting: Family

## 2017-06-30 DIAGNOSIS — F329 Major depressive disorder, single episode, unspecified: Secondary | ICD-10-CM

## 2017-06-30 DIAGNOSIS — F32A Depression, unspecified: Secondary | ICD-10-CM

## 2017-06-30 NOTE — Telephone Encounter (Signed)
Refill already sent see refill encounter...Pamela Mcguire

## 2017-06-30 NOTE — Telephone Encounter (Signed)
Pt would like a refill of escitalopram (LEXAPRO) 10 MG tablet  CPE set up for 8/13 w/[facility] Please advise

## 2017-07-01 ENCOUNTER — Other Ambulatory Visit: Payer: Self-pay | Admitting: Internal Medicine

## 2017-07-03 ENCOUNTER — Other Ambulatory Visit (HOSPITAL_COMMUNITY): Payer: Medicare HMO

## 2017-07-03 ENCOUNTER — Other Ambulatory Visit: Payer: Medicare HMO

## 2017-07-04 ENCOUNTER — Ambulatory Visit (HOSPITAL_COMMUNITY)
Admission: RE | Admit: 2017-07-04 | Discharge: 2017-07-04 | Disposition: A | Discharge status: Home / Self Care | Payer: Medicare HMO | Source: Ambulatory Visit | Attending: Internal Medicine | Admitting: Internal Medicine

## 2017-07-04 ENCOUNTER — Other Ambulatory Visit (HOSPITAL_BASED_OUTPATIENT_CLINIC_OR_DEPARTMENT_OTHER): Payer: Medicare HMO

## 2017-07-04 ENCOUNTER — Encounter (HOSPITAL_COMMUNITY): Payer: Self-pay

## 2017-07-04 DIAGNOSIS — C3411 Malignant neoplasm of upper lobe, right bronchus or lung: Secondary | ICD-10-CM | POA: Diagnosis not present

## 2017-07-04 DIAGNOSIS — I77811 Abdominal aortic ectasia: Secondary | ICD-10-CM | POA: Diagnosis not present

## 2017-07-04 DIAGNOSIS — C182 Malignant neoplasm of ascending colon: Secondary | ICD-10-CM

## 2017-07-04 DIAGNOSIS — Z923 Personal history of irradiation: Secondary | ICD-10-CM | POA: Insufficient documentation

## 2017-07-04 DIAGNOSIS — K4020 Bilateral inguinal hernia, without obstruction or gangrene, not specified as recurrent: Secondary | ICD-10-CM | POA: Diagnosis not present

## 2017-07-04 DIAGNOSIS — R911 Solitary pulmonary nodule: Secondary | ICD-10-CM | POA: Diagnosis not present

## 2017-07-04 DIAGNOSIS — I700 Atherosclerosis of aorta: Secondary | ICD-10-CM | POA: Insufficient documentation

## 2017-07-04 DIAGNOSIS — E279 Disorder of adrenal gland, unspecified: Secondary | ICD-10-CM | POA: Insufficient documentation

## 2017-07-04 LAB — COMPREHENSIVE METABOLIC PANEL
ALT: 15 U/L (ref 0–55)
AST: 15 U/L (ref 5–34)
Albumin: 3.6 g/dL (ref 3.5–5.0)
Alkaline Phosphatase: 64 U/L (ref 40–150)
Anion Gap: 10 mEq/L (ref 3–11)
BUN: 16.8 mg/dL (ref 7.0–26.0)
CO2: 28 meq/L (ref 22–29)
Calcium: 9.9 mg/dL (ref 8.4–10.4)
Chloride: 102 mEq/L (ref 98–109)
Creatinine: 1.1 mg/dL (ref 0.6–1.1)
EGFR: 50 mL/min/{1.73_m2} — ABNORMAL LOW (ref >90)
GLUCOSE: 117 mg/dL (ref 70–140)
POTASSIUM: 5 meq/L (ref 3.5–5.1)
SODIUM: 140 meq/L (ref 136–145)
TOTAL PROTEIN: 7.1 g/dL (ref 6.4–8.3)
Total Bilirubin: 0.49 mg/dL (ref 0.20–1.20)

## 2017-07-04 LAB — CBC WITH DIFFERENTIAL/PLATELET
BASO%: 0.5 % (ref 0.0–2.0)
Basophils Absolute: 0 10*3/uL (ref 0.0–0.1)
EOS ABS: 0.1 10*3/uL (ref 0.0–0.5)
EOS%: 0.9 % (ref 0.0–7.0)
HCT: 41.8 % (ref 34.8–46.6)
HEMOGLOBIN: 13.6 g/dL (ref 11.6–15.9)
LYMPH%: 12.2 % — AB (ref 14.0–49.7)
MCH: 28.5 pg (ref 25.1–34.0)
MCHC: 32.6 g/dL (ref 31.5–36.0)
MCV: 87.3 fL (ref 79.5–101.0)
MONO#: 0.6 10*3/uL (ref 0.1–0.9)
MONO%: 8.1 % (ref 0.0–14.0)
NEUT%: 78.3 % — ABNORMAL HIGH (ref 38.4–76.8)
NEUTROS ABS: 6 10*3/uL (ref 1.5–6.5)
Platelets: 299 10*3/uL (ref 145–400)
RBC: 4.78 10*6/uL (ref 3.70–5.45)
RDW: 16.9 % — AB (ref 11.2–14.5)
WBC: 7.7 10*3/uL (ref 3.9–10.3)
lymph#: 0.9 10*3/uL (ref 0.9–3.3)

## 2017-07-04 MED ORDER — IOPAMIDOL (ISOVUE-300) INJECTION 61%
INTRAVENOUS | Status: AC
  Filled 2017-07-04: qty 100

## 2017-07-04 MED ORDER — IOPAMIDOL (ISOVUE-300) INJECTION 61%
100.0000 mL | Freq: Once | INTRAVENOUS | Status: AC | PRN
  Administered 2017-07-04: 100 mL via INTRAVENOUS

## 2017-07-06 ENCOUNTER — Other Ambulatory Visit: Payer: Self-pay | Admitting: Medical Oncology

## 2017-07-06 ENCOUNTER — Ambulatory Visit (HOSPITAL_BASED_OUTPATIENT_CLINIC_OR_DEPARTMENT_OTHER): Payer: Medicare HMO | Admitting: Internal Medicine

## 2017-07-06 ENCOUNTER — Encounter: Payer: Self-pay | Admitting: Internal Medicine

## 2017-07-06 ENCOUNTER — Telehealth: Payer: Self-pay | Admitting: Internal Medicine

## 2017-07-06 VITALS — BP 142/62 | HR 81 | Temp 97.8°F | Resp 16 | Ht 66.0 in | Wt 182.7 lb

## 2017-07-06 DIAGNOSIS — C3411 Malignant neoplasm of upper lobe, right bronchus or lung: Secondary | ICD-10-CM | POA: Diagnosis not present

## 2017-07-06 DIAGNOSIS — C182 Malignant neoplasm of ascending colon: Secondary | ICD-10-CM

## 2017-07-06 DIAGNOSIS — R5383 Other fatigue: Secondary | ICD-10-CM

## 2017-07-06 DIAGNOSIS — F329 Major depressive disorder, single episode, unspecified: Secondary | ICD-10-CM

## 2017-07-06 DIAGNOSIS — D509 Iron deficiency anemia, unspecified: Secondary | ICD-10-CM | POA: Diagnosis not present

## 2017-07-06 NOTE — Progress Notes (Signed)
[place] [phone]   [phone]  OFFICE PROGRESS NOTE  [place], FNP [place] [address]  DIAGNOSIS:  1) Stage IIIA (T1a, N2, M0) non-small cell lung cancer Non-small cell carcinoma of lung, adenocarcinoma presented with right upper lobe lung mass and mediastinal lymphadenopathy diagnosed in May 2016. 2) stage II (T3, N0, MX) well-differentiated invasive adenocarcinoma diagnosed in February 2018.   PRIOR THERAPY:  1) Concurrent chemoradiation with chemotherapy in the form of weekly carboplatin for an AUC of 2 and paclitaxel at 45 mg/m given concurrent with radiation. Last dose was given 06/18/2015 with partial response. 2) status post laparoscopic partial colectomy for ascending colon cancer under the care of Dr. Marcello Moores on 02/08/2017.  CURRENT THERAPY: Observation.  INTERVAL HISTORY: Pamela Mcguire 82 y.o. female returns to the clinic today for follow-up visit accompanied by her daughter. The patient is feeling fine except for generalized fatigue and weakness. She denied having any chest pain but continues to have dry cough with no shortness breath with exertion and no hemoptysis. She has no nausea, vomiting, diarrhea or constipation. She denied having any significant weight loss or night sweats. The patient had repeat CT scan of the chest performed recently and she is here for evaluation and discussion of her scan results.  MEDICAL HISTORY: Past Medical History:  Diagnosis Date  . AF (atrial fibrillation) ([place])   . Anxiety   . Arthritis    "thighs, hips, arms" (04/28/2015)  . Breast cancer ([place])    right mastectomy  . CAD (coronary artery disease)   . Dehydration 12/06/2016  . Dementia   . Depression   . Diverticulitis   . GERD (gastroesophageal reflux disease)   . HCAP (healthcare-associated pneumonia) 09/15/2015  . Hyperlipidemia   . Hypertension   . Non-small cell carcinoma of lung, stage 3 ([place]) 04/30/2015    . Prediabetes   . Seasonal allergies   . Shortness of breath dyspnea   . Sleep apnea     dx'd 10/2014,refused cpap machine  . Urinary frequency   . UTI (urinary tract infection) 06/06/2015    ALLERGIES:  has No Known Allergies.  MEDICATIONS:  Current Outpatient Prescriptions  Medication Sig Dispense Refill  . albuterol (PROVENTIL HFA;VENTOLIN HFA) 108 (90 Base) MCG/ACT inhaler Inhale 1 puff into the lungs every 6 (six) hours as needed for wheezing or shortness of breath. Reported on 06/01/2016 18 g 1  . apixaban (ELIQUIS) 5 MG TABS tablet Take 1 tablet by mouth twice daily, please schedule MD appt for further refills 180 tablet 0  . atorvastatin (LIPITOR) 40 MG tablet TAKE 1 TABLET(40 MG) BY MOUTH DAILY 90 tablet 0  . buPROPion (WELLBUTRIN SR) 100 MG 12 hr tablet TAKE 1 TABLET(100 MG) BY MOUTH DAILY 90 tablet 0  . CALCIUM-VITAMIN D PO Take 1 tablet by mouth daily.    . carvedilol (COREG) 3.125 MG tablet TAKE 1 TABLET(3.125 MG) BY MOUTH TWICE DAILY 180 tablet 2  . escitalopram (LEXAPRO) 10 MG tablet Take 1 tablet (10 mg total) by mouth daily. Follow-up appt is due must see provider for future refills 30 tablet 0  . fluticasone furoate-vilanterol (BREO ELLIPTA) 100-25 MCG/INH AEPB Inhale 1 puff into the lungs daily. 28 each 3  . loperamide (IMODIUM A-D) 2 MG tablet Take 2-4 mg by mouth 4 (four) times daily as needed for diarrhea or loose stools.     Marland Kitchen loratadine (CLARITIN) 10 MG tablet TAKE 1 TABLET BY  MOUTH AT BEDTIME 90 tablet 0  . LORazepam (ATIVAN) 2 MG tablet TAKE 1 TABLET BY MOUTH AT BEDTIME AS NEEDED FOR ANXIETY 30 tablet 0  . Multiple Vitamins-Minerals (MULTIVITAMIN WITH MINERALS) tablet Take 1 tablet by mouth daily.    Marland Kitchen NUTRITIONAL SUPPLEMENT LIQD Take 120 mLs by mouth 3 (three) times daily. [employer]    . oxybutynin (DITROPAN) 5 MG tablet TAKE 1/2 TABLET(2.5 MG) BY MOUTH TWICE DAILY 90 tablet 0  . oxybutynin (DITROPAN) 5 MG tablet TAKE 1/2 TABLET(2.5 MG) BY MOUTH TWICE DAILY 30  tablet 0  . oxyCODONE (OXY IR/ROXICODONE) 5 MG immediate release tablet Take 0.5-1 tablets (2.5-5 mg total) by mouth every 6 (six) hours as needed for severe pain. 30 tablet 0  . OXYGEN Inhale 2 L into the lungs as needed.    . pantoprazole (PROTONIX) 40 MG tablet TAKE 1 TABLET BY MOUTH EVERY DAY 90 tablet 0   No current facility-administered medications for this visit.     SURGICAL HISTORY:  Past Surgical History:  Procedure Laterality Date  . BREAST SURGERY    . CARDIOVERSION    . CESAREAN SECTION  X 2  . COLON SURGERY    . COLONOSCOPY    . COLONOSCOPY N/A 01/13/2017   Procedure: COLONOSCOPY;  Surgeon: Gatha Mayer, MD;  Location: [facility];  Service: Endoscopy;  Laterality: N/A;  . ESOPHAGOGASTRODUODENOSCOPY N/A 01/13/2017   Procedure: ESOPHAGOGASTRODUODENOSCOPY (EGD);  Surgeon: Gatha Mayer, MD;  Location: [facility];  Service: Endoscopy;  Laterality: N/A;  . LAPAROSCOPIC PARTIAL COLECTOMY N/A 02/08/2017   Procedure: LAPAROSCOPIC PARTIAL COLECTOMY;  Surgeon: Leighton Ruff, MD;  Location: [facility]S;  Service: General;  Laterality: N/A;  . MASTECTOMY Right   . MEDIASTINOSCOPY N/A 04/28/2015   Procedure: MEDIASTINOSCOPY;  Surgeon: [name], MD;  Location: [facility] OR;  Service: Thoracic;  Laterality: N/A;  . TONSILLECTOMY      REVIEW OF SYSTEMS:  A comprehensive review of systems was negative except for: Constitutional: positive for fatigue Respiratory: positive for cough and dyspnea on exertion   PHYSICAL EXAMINATION: General appearance: alert, cooperative, fatigued and no distress Head: Normocephalic, without obvious abnormality, atraumatic Neck: no adenopathy, no JVD, supple, symmetrical, trachea midline and thyroid not enlarged, symmetric, no tenderness/mass/nodules Lymph nodes: Cervical, supraclavicular, and axillary nodes normal. Resp: wheezes bilaterally Back: symmetric, no curvature. ROM normal. No CVA tenderness. Cardio: regular rate and rhythm, S1, S2 normal, no  murmur, click, rub or gallop GI: soft, non-tender; bowel sounds normal; no masses,  no organomegaly Extremities: extremities normal, atraumatic, no cyanosis or edema  ECOG PERFORMANCE STATUS: 2 - Symptomatic, <50% confined to bed  Blood pressure (!) 142/62, pulse 81, temperature 97.8 F (36.6 C), temperature source Oral, resp. rate 16, height 5\' 6"  (1.676 m), weight 182 lb 11.2 oz (82.9 kg), SpO2 96 %.  LABORATORY DATA: Lab Results  Component Value Date   WBC 7.7 07/04/2017   HGB 13.6 07/04/2017   HCT 41.8 07/04/2017   MCV 87.3 07/04/2017   PLT 299 07/04/2017      Chemistry      Component Value Date/Time   NA 140 07/04/2017 1244   K 5.0 07/04/2017 1244   CL 101 02/14/2017 0532   CO2 28 07/04/2017 1244   BUN 16.8 07/04/2017 1244   CREATININE 1.1 07/04/2017 1244   GLU 123 02/17/2017      Component Value Date/Time   CALCIUM 9.9 07/04/2017 1244   ALKPHOS 64 07/04/2017 1244   AST 15 07/04/2017 1244  ALT 15 07/04/2017 1244   BILITOT 0.49 07/04/2017 1244       RADIOGRAPHIC STUDIES: Ct Chest W Contrast  Result Date: 07/04/2017 CLINICAL DATA:  Patient with history of breast cancer diagnosed in 2004 lung cancer diagnosed 2016. Restaging exam. EXAM: CT CHEST, ABDOMEN, AND PELVIS WITH CONTRAST TECHNIQUE: Multidetector CT imaging of the chest, abdomen and pelvis was performed following the standard protocol during bolus administration of intravenous contrast. CONTRAST:  131mL ISOVUE-300 IOPAMIDOL (ISOVUE-300) INJECTION 61% COMPARISON:  CT chest 03/27/2017; PET-CT 12/22/2016. FINDINGS: CT CHEST FINDINGS Cardiovascular: Heart is mildly enlarged. Coronary arterial vascular calcifications. No pericardial effusion. Aortic vascular calcifications. Mediastinum/Nodes: Postsurgical changes right axilla. No left axillary adenopathy. Stable appearing mediastinal lymph nodes including an 8 mm pretracheal lymph node (image 20; series 2), a 9 mm precarinal lymph node (image 23; series 2) and a 13 mm  subcarinal lymph node (image 29; series 2). Small hiatal hernia. Lungs/Pleura: Central airways are patent. Re- demonstrated subpleural reticular opacities bilaterally. Similar-appearing right lower lobe pulmonary nodule measuring 11 x 8 mm (image 96; series 4), previously 11 x 6 mm. Unchanged postradiation changes with collapse, bronchiectasis and retraction within the right upper lobe. Overall this is similar appearance when compared to recent prior exam. Re- demonstrated small right pleural effusion. No pneumothorax. Musculoskeletal: Thoracic spine degenerative changes. No aggressive or acute appearing osseous lesions. Prior right mastectomy. CT ABDOMEN PELVIS FINDINGS Hepatobiliary: Liver is normal in size and contour. No focal hepatic lesion is identified. Patient status post cholecystectomy. Similar-appearing dilated common bile duct measuring up to 11 mm. Pancreas: Unremarkable Spleen: Stable subcentimeter low-attenuation lesion within the spleen. Adrenals/Urinary Tract: Stable 8 mm left adrenal nodule (image 58; series 2). Kidneys enhance symmetrically with contrast. Stable bilateral renal hypodensities the largest compatible with a cyst within the interpolar region the right kidney measuring 3.5 cm. Urinary bladder is decompressed. Stomach/Bowel: Descending and sigmoid colonic diverticulosis. No CT evidence for acute diverticulitis. Small hiatal hernia. Normal morphology of the stomach. No evidence for bowel obstruction. Duodenum diverticulum. Similar-appearing fistulous tract or thick wall large diverticulum off of the descending colon (image 98; series 2). Vascular/Lymphatic: Infrarenal abdominal aortic ectasia measuring 2.0 cm. No retroperitoneal lymphadenopathy. Reproductive: Uterus and adnexal structures are unremarkable. Other: Small bilateral fat containing inguinal hernias. Musculoskeletal: Lumbar spine degenerative changes. No aggressive or acute appearing osseous lesions. Persistent wedge  compression deformity of the L1 vertebral body. IMPRESSION: 1. No significant interval change in size of right lower lobe pulmonary nodule. 2. Re- demonstrated radiation changes within the right hemithorax with small loculated right pleural fluid. 3. Re- demonstrated are chronic changes within the lungs bilaterally. 4. Stable left adrenal nodule. 5. Aortic atherosclerosis. Electronically Signed   By: Lovey Newcomer M.D.   On: 07/04/2017 19:57   Ct Abdomen Pelvis W Contrast  Result Date: 07/04/2017 CLINICAL DATA:  Patient with history of breast cancer diagnosed in 2004 lung cancer diagnosed 2016. Restaging exam. EXAM: CT CHEST, ABDOMEN, AND PELVIS WITH CONTRAST TECHNIQUE: Multidetector CT imaging of the chest, abdomen and pelvis was performed following the standard protocol during bolus administration of intravenous contrast. CONTRAST:  149mL ISOVUE-300 IOPAMIDOL (ISOVUE-300) INJECTION 61% COMPARISON:  CT chest 03/27/2017; PET-CT 12/22/2016. FINDINGS: CT CHEST FINDINGS Cardiovascular: Heart is mildly enlarged. Coronary arterial vascular calcifications. No pericardial effusion. Aortic vascular calcifications. Mediastinum/Nodes: Postsurgical changes right axilla. No left axillary adenopathy. Stable appearing mediastinal lymph nodes including an 8 mm pretracheal lymph node (image 20; series 2), a 9 mm precarinal lymph node (image 23; series 2) and  a 13 mm subcarinal lymph node (image 29; series 2). Small hiatal hernia. Lungs/Pleura: Central airways are patent. Re- demonstrated subpleural reticular opacities bilaterally. Similar-appearing right lower lobe pulmonary nodule measuring 11 x 8 mm (image 96; series 4), previously 11 x 6 mm. Unchanged postradiation changes with collapse, bronchiectasis and retraction within the right upper lobe. Overall this is similar appearance when compared to recent prior exam. Re- demonstrated small right pleural effusion. No pneumothorax. Musculoskeletal: Thoracic spine degenerative  changes. No aggressive or acute appearing osseous lesions. Prior right mastectomy. CT ABDOMEN PELVIS FINDINGS Hepatobiliary: Liver is normal in size and contour. No focal hepatic lesion is identified. Patient status post cholecystectomy. Similar-appearing dilated common bile duct measuring up to 11 mm. Pancreas: Unremarkable Spleen: Stable subcentimeter low-attenuation lesion within the spleen. Adrenals/Urinary Tract: Stable 8 mm left adrenal nodule (image 58; series 2). Kidneys enhance symmetrically with contrast. Stable bilateral renal hypodensities the largest compatible with a cyst within the interpolar region the right kidney measuring 3.5 cm. Urinary bladder is decompressed. Stomach/Bowel: Descending and sigmoid colonic diverticulosis. No CT evidence for acute diverticulitis. Small hiatal hernia. Normal morphology of the stomach. No evidence for bowel obstruction. Duodenum diverticulum. Similar-appearing fistulous tract or thick wall large diverticulum off of the descending colon (image 98; series 2). Vascular/Lymphatic: Infrarenal abdominal aortic ectasia measuring 2.0 cm. No retroperitoneal lymphadenopathy. Reproductive: Uterus and adnexal structures are unremarkable. Other: Small bilateral fat containing inguinal hernias. Musculoskeletal: Lumbar spine degenerative changes. No aggressive or acute appearing osseous lesions. Persistent wedge compression deformity of the L1 vertebral body. IMPRESSION: 1. No significant interval change in size of right lower lobe pulmonary nodule. 2. Re- demonstrated radiation changes within the right hemithorax with small loculated right pleural fluid. 3. Re- demonstrated are chronic changes within the lungs bilaterally. 4. Stable left adrenal nodule. 5. Aortic atherosclerosis. Electronically Signed   By: Lovey Newcomer M.D.   On: 07/04/2017 19:57    ASSESSMENT AND PLAN:  This is a very pleasant 82 years old white female with: 1) stage IIIa non-small cell lung cancer,  adenocarcinoma status post concurrent chemoradiation with partial response and she was not taking good candidate for consolidation chemotherapy. The patient is currently on observation. Her recent CT scan of the chest showed no evidence for disease progression. I discussed the scan results with the patient and her daughter and recommended for her to continue on observation. She would have repeat CT scan of the chest in 6 months. 2) stage II (T3, N0, Mx) colon adenocarcinoma status post partial colectomy with lymph node dissection. The patient is recovering well from her surgery. She is not a good candidate for adjuvant therapy based on her stage and performance status. I recommended for the patient to continue on observation for now. 3) iron deficiency anemia: Her anemia has significantly improved.  She was advised to call immediately if she has any concerning symptoms in the interval. The patient voices understanding of current disease status and treatment options and is in agreement with the current care plan. All questions were answered. The patient knows to call the clinic with any problems, questions or concerns. We can certainly see the patient much sooner if necessary.  Disclaimer: This note was dictated with voice recognition software. Similar sounding words can inadvertently be transcribed and may not be corrected upon review.

## 2017-07-06 NOTE — Telephone Encounter (Signed)
Scheduled appt per 8/9 los - Gave patient AVS and calender per [facility] to contact patient with ct schedule.

## 2017-07-10 ENCOUNTER — Ambulatory Visit (INDEPENDENT_AMBULATORY_CARE_PROVIDER_SITE_OTHER): Payer: Medicare HMO | Admitting: Family

## 2017-07-10 ENCOUNTER — Other Ambulatory Visit (INDEPENDENT_AMBULATORY_CARE_PROVIDER_SITE_OTHER): Payer: Medicare HMO

## 2017-07-10 ENCOUNTER — Encounter: Payer: Self-pay | Admitting: Family

## 2017-07-10 VITALS — BP 118/62 | HR 74 | Temp 97.9°F | Resp 16 | Ht 66.0 in | Wt 185.4 lb

## 2017-07-10 DIAGNOSIS — Z7189 Other specified counseling: Secondary | ICD-10-CM | POA: Insufficient documentation

## 2017-07-10 DIAGNOSIS — I480 Paroxysmal atrial fibrillation: Secondary | ICD-10-CM | POA: Diagnosis not present

## 2017-07-10 DIAGNOSIS — Z0000 Encounter for general adult medical examination without abnormal findings: Secondary | ICD-10-CM | POA: Diagnosis not present

## 2017-07-10 DIAGNOSIS — Z0001 Encounter for general adult medical examination with abnormal findings: Secondary | ICD-10-CM

## 2017-07-10 DIAGNOSIS — I10 Essential (primary) hypertension: Secondary | ICD-10-CM

## 2017-07-10 DIAGNOSIS — E785 Hyperlipidemia, unspecified: Secondary | ICD-10-CM | POA: Diagnosis not present

## 2017-07-10 DIAGNOSIS — C3411 Malignant neoplasm of upper lobe, right bronchus or lung: Secondary | ICD-10-CM | POA: Diagnosis not present

## 2017-07-10 DIAGNOSIS — C182 Malignant neoplasm of ascending colon: Secondary | ICD-10-CM | POA: Diagnosis not present

## 2017-07-10 LAB — COMPREHENSIVE METABOLIC PANEL
ALBUMIN: 4.1 g/dL (ref 3.5–5.2)
ALT: 12 U/L (ref 0–35)
AST: 13 U/L (ref 0–37)
Alkaline Phosphatase: 62 U/L (ref 39–117)
BUN: 19 mg/dL (ref 6–23)
CALCIUM: 9.6 mg/dL (ref 8.4–10.5)
CHLORIDE: 103 meq/L (ref 96–112)
CO2: 29 meq/L (ref 19–32)
CREATININE: 0.81 mg/dL (ref 0.40–1.20)
GFR: 72.21 mL/min (ref >60.00)
Glucose, Bld: 101 mg/dL — ABNORMAL HIGH (ref 70–99)
POTASSIUM: 4.4 meq/L (ref 3.5–5.1)
Sodium: 138 mEq/L (ref 135–145)
Total Bilirubin: 0.4 mg/dL (ref 0.2–1.2)
Total Protein: 6.8 g/dL (ref 6.0–8.3)

## 2017-07-10 LAB — URINALYSIS, ROUTINE W REFLEX MICROSCOPIC
BILIRUBIN URINE: NEGATIVE
Nitrite: NEGATIVE
Specific Gravity, Urine: >=1.03 — AB (ref 1.000–1.030)
Urine Glucose: NEGATIVE
Urobilinogen, UA: 0.2 (ref 0.0–1.0)
pH: 6 (ref 5.0–8.0)

## 2017-07-10 LAB — CBC
HCT: 42.1 % (ref 36.0–46.0)
Hemoglobin: 13.5 g/dL (ref 12.0–15.0)
MCHC: 32.1 g/dL (ref 30.0–36.0)
MCV: 89.8 fl (ref 78.0–100.0)
Platelets: 314 10*3/uL (ref 150.0–400.0)
RBC: 4.69 Mil/uL (ref 3.87–5.11)
RDW: 16.1 % — AB (ref 11.5–15.5)
WBC: 8.3 10*3/uL (ref 4.0–10.5)

## 2017-07-10 LAB — LIPID PANEL
CHOL/HDL RATIO: 4
CHOLESTEROL: 177 mg/dL (ref 0–200)
HDL: 47.9 mg/dL (ref >39.00)
LDL CALC: 95 mg/dL (ref 0–99)
NonHDL: 129.4
TRIGLYCERIDES: 174 mg/dL — AB (ref 0.0–149.0)
VLDL: 34.8 mg/dL (ref 0.0–40.0)

## 2017-07-10 NOTE — Assessment & Plan Note (Signed)
1) Anticipatory Guidance: Discussed importance of wearing a seatbelt while driving and not texting while driving; changing batteries in smoke detector at least once annually; wearing suntan lotion when outside; eating a balanced and moderate diet; getting physical activity at least 30 minutes per day.  2) Immunizations / Screenings / Labs:  Declines tetanus. All other immunizations are up-to-date per recommendations. All screenings are up-to-date per recommendations. Obtain CBC, CMET, and lipid profile.    Overall fair exam with multiple comorbidities with non-small cell lung cancer, ascending colon cancer, and atrial fibrillation. Appears to be stable with current medication regimens. Encouraged to increase physical activity as able with goal of some walking daily. Follow-up prevention exam in 1 year. Follow-up office visit for chronic conditions pending blood work results.

## 2017-07-10 NOTE — Assessment & Plan Note (Signed)
Stable and anticoagulated with Eliquis. Continue current dosage of Eliquis.

## 2017-07-10 NOTE — Patient Instructions (Addendum)
Thank you for choosing [facility].  SUMMARY AND INSTRUCTIONS:  Continue to take your medication as prescribed.  Immodium for diarrhea. We can consider additional medications as needed.   Continue to follow up with Dr. Julien Nordmann.  We will check your blood work today.   Labs:  Please stop by the lab on the lower level of the building for your blood work. Your results will be released to [facility] (or called to you) after review, usually within 72 hours after test completion. If any changes need to be made, you will be notified at that same time.  1.) The lab is open from 7:30am to 5:30 pm Monday-Friday 2.) No appointment is necessary 3.) Fasting (if needed) is 6-8 hours after food and drink; black coffee and water are okay   Follow up:  If your symptoms worsen or fail to improve, please contact our office for further instruction, or in case of emergency go directly to the emergency room at the closest medical facility.   Health Maintenance  Topic Date Due  . DEXA SCAN  12/30/2001  . INFLUENZA VACCINE  08/06/2017 (Originally 06/28/2017)  . TETANUS/TDAP  07/10/2018 (Originally 12/31/1953)  . PNA vac Low Risk Adult  Completed    Health Maintenance, Female Adopting a healthy lifestyle and getting preventive care can go a long way to promote health and wellness. Talk with your health care provider about what schedule of regular examinations is right for you. This is a good chance for you to check in with your provider about disease prevention and staying healthy. In between checkups, there are plenty of things you can do on your own. Experts have done a lot of research about which lifestyle changes and preventive measures are most likely to keep you healthy. Ask your health care provider for more information. Weight and diet Eat a healthy diet  Be sure to include plenty of vegetables, fruits, low-fat dairy products, and lean protein.  Do not eat a lot of foods high in solid fats,  added sugars, or salt.  Get regular exercise. This is one of the most important things you can do for your health. ? Most adults should exercise for at least 150 minutes each week. The exercise should increase your heart rate and make you sweat (moderate-intensity exercise). ? Most adults should also do strengthening exercises at least twice a week. This is in addition to the moderate-intensity exercise.  Maintain a healthy weight  Body mass index (BMI) is a measurement that can be used to identify possible weight problems. It estimates body fat based on height and weight. Your health care provider can help determine your BMI and help you achieve or maintain a healthy weight.  For females 47 years of age and older: ? A BMI below 18.5 is considered underweight. ? A BMI of 18.5 to 24.9 is normal. ? A BMI of 25 to 29.9 is considered overweight. ? A BMI of 30 and above is considered obese.  Watch levels of cholesterol and blood lipids  You should start having your blood tested for lipids and cholesterol at 80 years of age, then have this test every 5 years.  You may need to have your cholesterol levels checked more often if: ? Your lipid or cholesterol levels are high. ? You are older than 80 years of age. ? You are at high risk for heart disease.  Cancer screening Lung Cancer  Lung cancer screening is recommended for adults 29-64 years old who are at high  risk for lung cancer because of a history of smoking.  A yearly low-dose CT scan of the lungs is recommended for people who: ? Currently smoke. ? Have quit within the past 15 years. ? Have at least a 30-pack-year history of smoking. A pack year is smoking an average of one pack of cigarettes a day for 1 year.  Yearly screening should continue until it has been 15 years since you quit.  Yearly screening should stop if you develop a health problem that would prevent you from having lung cancer treatment.  Breast Cancer  Practice  breast self-awareness. This means understanding how your breasts normally appear and feel.  It also means doing regular breast self-exams. Let your health care provider know about any changes, no matter how small.  If you are in your 20s or 30s, you should have a clinical breast exam (CBE) by a health care provider every 1-3 years as part of a regular health exam.  If you are 55 or older, have a CBE every year. Also consider having a breast X-ray (mammogram) every year.  If you have a family history of breast cancer, talk to your health care provider about genetic screening.  If you are at high risk for breast cancer, talk to your health care provider about having an MRI and a mammogram every year.  Breast cancer gene (BRCA) assessment is recommended for women who have family members with BRCA-related cancers. BRCA-related cancers include: ? Breast. ? Ovarian. ? Tubal. ? Peritoneal cancers.  Results of the assessment will determine the need for genetic counseling and BRCA1 and BRCA2 testing.  Cervical Cancer Your health care provider may recommend that you be screened regularly for cancer of the pelvic organs (ovaries, uterus, and vagina). This screening involves a pelvic examination, including checking for microscopic changes to the surface of your cervix (Pap test). You may be encouraged to have this screening done every 3 years, beginning at age 42.  For women ages 63-65, health care providers may recommend pelvic exams and Pap testing every 3 years, or they may recommend the Pap and pelvic exam, combined with testing for human papilloma virus (HPV), every 5 years. Some types of HPV increase your risk of cervical cancer. Testing for HPV may also be done on women of any age with unclear Pap test results.  Other health care providers may not recommend any screening for nonpregnant women who are considered low risk for pelvic cancer and who do not have symptoms. Ask your health care provider  if a screening pelvic exam is right for you.  If you have had past treatment for cervical cancer or a condition that could lead to cancer, you need Pap tests and screening for cancer for at least 20 years after your treatment. If Pap tests have been discontinued, your risk factors (such as having a new sexual partner) need to be reassessed to determine if screening should resume. Some women have medical problems that increase the chance of getting cervical cancer. In these cases, your health care provider may recommend more frequent screening and Pap tests.  Colorectal Cancer  This type of cancer can be detected and often prevented.  Routine colorectal cancer screening usually begins at 80 years of age and continues through 80 years of age.  Your health care provider may recommend screening at an earlier age if you have risk factors for colon cancer.  Your health care provider may also recommend using home test kits to check for hidden  blood in the stool.  A small camera at the end of a tube can be used to examine your colon directly (sigmoidoscopy or colonoscopy). This is done to check for the earliest forms of colorectal cancer.  Routine screening usually begins at age 30.  Direct examination of the colon should be repeated every 5-10 years through 80 years of age. However, you may need to be screened more often if early forms of precancerous polyps or small growths are found.  Skin Cancer  Check your skin from head to toe regularly.  Tell your health care provider about any new moles or changes in moles, especially if there is a change in a mole's shape or color.  Also tell your health care provider if you have a mole that is larger than the size of a pencil eraser.  Always use sunscreen. Apply sunscreen liberally and repeatedly throughout the day.  Protect yourself by wearing long sleeves, pants, a wide-brimmed hat, and sunglasses whenever you are outside.  Heart disease,  diabetes, and high blood pressure  High blood pressure causes heart disease and increases the risk of stroke. High blood pressure is more likely to develop in: ? People who have blood pressure in the high end of the normal range (130-139/85-89 mm Hg). ? People who are overweight or obese. ? People who are African American.  If you are 58-74 years of age, have your blood pressure checked every 3-5 years. If you are 58 years of age or older, have your blood pressure checked every year. You should have your blood pressure measured twice-once when you are at a hospital or clinic, and once when you are not at a hospital or clinic. Record the average of the two measurements. To check your blood pressure when you are not at a hospital or clinic, you can use: ? An automated blood pressure machine at a pharmacy. ? A home blood pressure monitor.  If you are between 63 years and 15 years old, ask your health care provider if you should take aspirin to prevent strokes.  Have regular diabetes screenings. This involves taking a blood sample to check your fasting blood sugar level. ? If you are at a normal weight and have a low risk for diabetes, have this test once every three years after 80 years of age. ? If you are overweight and have a high risk for diabetes, consider being tested at a younger age or more often. Preventing infection Hepatitis B  If you have a higher risk for hepatitis B, you should be screened for this virus. You are considered at high risk for hepatitis B if: ? You were born in a country where hepatitis B is common. Ask your health care provider which countries are considered high risk. ? Your parents were born in a high-risk country, and you have not been immunized against hepatitis B (hepatitis B vaccine). ? You have HIV or AIDS. ? You use needles to inject street drugs. ? You live with someone who has hepatitis B. ? You have had sex with someone who has hepatitis B. ? You get  hemodialysis treatment. ? You take certain medicines for conditions, including cancer, organ transplantation, and autoimmune conditions.  Hepatitis C  Blood testing is recommended for: ? Everyone born from [birth year] through 1963. ? Anyone with known risk factors for hepatitis C.  Sexually transmitted infections (STIs)  You should be screened for sexually transmitted infections (STIs) including gonorrhea and chlamydia if: ? You are sexually active  and are younger than 80 years of age. ? You are older than 80 years of age and your health care provider tells you that you are at risk for this type of infection. ? Your sexual activity has changed since you were last screened and you are at an increased risk for chlamydia or gonorrhea. Ask your health care provider if you are at risk.  If you do not have HIV, but are at risk, it may be recommended that you take a prescription medicine daily to prevent HIV infection. This is called pre-exposure prophylaxis (PrEP). You are considered at risk if: ? You are sexually active and do not regularly use condoms or know the HIV status of your partner(s). ? You take drugs by injection. ? You are sexually active with a partner who has HIV.  Talk with your health care provider about whether you are at high risk of being infected with HIV. If you choose to begin PrEP, you should first be tested for HIV. You should then be tested every 3 months for as long as you are taking PrEP. Pregnancy  If you are premenopausal and you may become pregnant, ask your health care provider about preconception counseling.  If you may become pregnant, take 400 to 800 micrograms (mcg) of folic acid every day.  If you want to prevent pregnancy, talk to your health care provider about birth control (contraception). Osteoporosis and menopause  Osteoporosis is a disease in which the bones lose minerals and strength with aging. This can result in serious bone fractures. Your risk for  osteoporosis can be identified using a bone density scan.  If you are 58 years of age or older, or if you are at risk for osteoporosis and fractures, ask your health care provider if you should be screened.  Ask your health care provider whether you should take a calcium or vitamin D supplement to lower your risk for osteoporosis.  Menopause may have certain physical symptoms and risks.  Hormone replacement therapy may reduce some of these symptoms and risks. Talk to your health care provider about whether hormone replacement therapy is right for you. Follow these instructions at home:  Schedule regular health, dental, and eye exams.  Stay current with your immunizations.  Do not use any tobacco products including cigarettes, chewing tobacco, or electronic cigarettes.  If you are pregnant, do not drink alcohol.  If you are breastfeeding, limit how much and how often you drink alcohol.  Limit alcohol intake to no more than 1 drink per day for nonpregnant women. One drink equals 12 ounces of beer, 5 ounces of wine, or 1 ounces of hard liquor.  Do not use street drugs.  Do not share needles.  Ask your health care provider for help if you need support or information about quitting drugs.  Tell your health care provider if you often feel depressed.  Tell your health care provider if you have ever been abused or do not feel safe at home. This information is not intended to replace advice given to you by your health care provider. Make sure you discuss any questions you have with your health care provider. Document Released: 05/30/2011 Document Revised: 04/21/2016 Document Reviewed: 08/18/2015 Elsevier Interactive Patient Education  [employer].

## 2017-07-10 NOTE — Assessment & Plan Note (Signed)
Maintained on atorvastatin with no adverse side effects or myalgias. Obtain lipid profile. Continue current dosage of atorvastatin pending lipid profile results.

## 2017-07-10 NOTE — Assessment & Plan Note (Signed)
Blood pressure well controlled with current medication regimen and no adverse side effects. Continue current medication and encourage water blood pressure at home and follow low-sodium diet.

## 2017-07-10 NOTE — Assessment & Plan Note (Signed)
Reviewed and updated patient's medical, surgical, family and social history. Medications and allergies were also reviewed. Basic screenings for depression, activities of daily living, hearing, cognition and safety were performed. Provider list was updated and health plan was provided to the patient.  

## 2017-07-10 NOTE — Assessment & Plan Note (Signed)
Stable with no current exacerbation of symptoms although does fatigue easily. Continue current medication regimen with follow-up and changes per oncology.

## 2017-07-10 NOTE — Progress Notes (Signed)
Subjective:    Patient ID: Pamela Mcguire, female    DOB: [birth year], 82 y.o.   MRN: [id number]  Chief Complaint  Patient presents with  . CPE    not fasting    HPI:  Pamela Mcguire is a 82 y.o. female who presents today for a Medicare Annual Wellness/Physical exam.    1) Health Maintenance -   Diet - Averages about 2-3 meals per day consisting of a regular diet; Caffeine intake of at least 1-2 cups daily.   Exercise - No structured exercise.   2) Preventative Exams / Immunizations:  Dental -- Up to date  Vision -- Up todate   Health Maintenance  Topic Date Due  . DEXA SCAN  12/30/2001  . INFLUENZA VACCINE  08/06/2017 (Originally 06/28/2017)  . TETANUS/TDAP  07/10/2018 (Originally 12/31/1953)  . PNA vac Low Risk Adult  Completed     Immunization History  Administered Date(s) Administered  . Influenza, High Dose Seasonal PF 08/18/2015  . Influenza-Unspecified 09/28/2013  . PPD Test 09/17/2015, 02/15/2017, 03/01/2017  . Pneumococcal Conjugate-13 09/28/2013  . Pneumococcal Polysaccharide-23 04/29/2015    RISK FACTORS  Tobacco History  Smoking Status  . Former Smoker  . Packs/day: 1.00  . Years: 35.00  . Types: Cigarettes  . Quit date: 11/28/1992  Smokeless Tobacco  . Never Used    Comment: "stopped smoking at age 36"     Cardiac risk factors: advanced age (older than 67 for men, 64 for women), dyslipidemia, hypertension, obesity (BMI >= 30 kg/m2) and sedentary lifestyle.  Depression Screen  Depression screen [facility] 2/9 07/10/2017  Decreased Interest 0  Down, Depressed, Hopeless 0  PHQ - 2 Score 0     Activities of Daily Living In your present state of health, do you have any difficulty performing the following activities?:  Driving? Yes - Not currently driving Managing money?  No Feeding yourself? No Getting from bed to chair? No Climbing a flight of stairs? Yes Preparing food and eating?: Children prepare her foods Bathing or showering? Children help  with bathing Getting dressed: No Getting to the toilet? No Using the toilet: No Moving around from place to place: Yes - with Rollator In the past year have you fallen or had a near fall?:No   Home Safety Has smoke detector and wears seat belts. No excess sun exposure. Are there smokers in your home (other than you)? Yes Do you feel safe at home?  Yes  Hearing Difficulties: No Do you often ask people to speak up or repeat themselves? No Do you experience ringing or noises in your ears? No  Do you have difficulty understanding soft or whispered voices? No    Cognitive Testing  Alert? Yes   Normal Appearance? Yes  Oriented to person? Yes  Place? Yes   Time? Yes  Recall of three objects?  Yes  Can perform simple calculations? Yes  Displays appropriate judgment? Yes  Can read the correct time from a watch face? Yes  Do you feel that you have a problem with memory? No  Do you often misplace items? No   Advanced Directives have been discussed with the patient? Yes   Current Physicians/Providers and Suppliers  1. Terri Piedra, FNP - Internal Medicine 2. Lyman Bishop, MD - Cardiology 3. Curt Bears, MD -  Oncology  4. Leighton Ruff, MD -  General Surgery   Indicate any recent Medical Services you may have received from other than [facility] providers in the past year (date may be  approximate).  All answers were reviewed with the patient and necessary referrals were made:  Mauricio Po, [place]   07/10/2017    No Known Allergies   Outpatient Medications Prior to Visit  Medication Sig Dispense Refill  . albuterol (PROVENTIL HFA;VENTOLIN HFA) 108 (90 Base) MCG/ACT inhaler Inhale 1 puff into the lungs every 6 (six) hours as needed for wheezing or shortness of breath. Reported on 06/01/2016 18 g 1  . apixaban (ELIQUIS) 5 MG TABS tablet Take 1 tablet by mouth twice daily, please schedule MD appt for further refills 180 tablet 0  . atorvastatin (LIPITOR) 40 MG tablet TAKE 1 TABLET(40  MG) BY MOUTH DAILY 90 tablet 0  . buPROPion (WELLBUTRIN SR) 100 MG 12 hr tablet TAKE 1 TABLET(100 MG) BY MOUTH DAILY 90 tablet 0  . CALCIUM-VITAMIN D PO Take 1 tablet by mouth daily.    . carvedilol (COREG) 3.125 MG tablet TAKE 1 TABLET(3.125 MG) BY MOUTH TWICE DAILY 180 tablet 2  . escitalopram (LEXAPRO) 10 MG tablet Take 1 tablet (10 mg total) by mouth daily. Follow-up appt is due must see provider for future refills 30 tablet 0  . fluticasone furoate-vilanterol (BREO ELLIPTA) 100-25 MCG/INH AEPB Inhale 1 puff into the lungs daily. 28 each 3  . ketorolac (ACULAR) 0.4 % SOLN   1  . loperamide (IMODIUM A-D) 2 MG tablet Take 2-4 mg by mouth 4 (four) times daily as needed for diarrhea or loose stools.     Marland Kitchen loratadine (CLARITIN) 10 MG tablet TAKE 1 TABLET BY MOUTH AT BEDTIME 90 tablet 0  . LORazepam (ATIVAN) 2 MG tablet TAKE 1 TABLET BY MOUTH AT BEDTIME AS NEEDED FOR ANXIETY 30 tablet 0  . Multiple Vitamins-Minerals (MULTIVITAMIN WITH MINERALS) tablet Take 1 tablet by mouth daily.    Marland Kitchen NUTRITIONAL SUPPLEMENT LIQD Take 120 mLs by mouth 3 (three) times daily. [employer]    . ofloxacin (OCUFLOX) 0.3 % ophthalmic solution   1  . oxybutynin (DITROPAN) 5 MG tablet TAKE 1/2 TABLET(2.5 MG) BY MOUTH TWICE DAILY 90 tablet 0  . oxyCODONE (OXY IR/ROXICODONE) 5 MG immediate release tablet Take 0.5-1 tablets (2.5-5 mg total) by mouth every 6 (six) hours as needed for severe pain. 30 tablet 0  . OXYGEN Inhale 2 L into the lungs as needed.    . pantoprazole (PROTONIX) 40 MG tablet TAKE 1 TABLET BY MOUTH EVERY DAY 90 tablet 0  . prednisoLONE acetate (PRED FORTE) 1 % ophthalmic suspension INT 1 GTT INTO SURGICAL EYE QID AFTER SURGERY  1   No facility-administered medications prior to visit.      Past Medical History:  Diagnosis Date  . AF (atrial fibrillation) ([place])   . Anxiety   . Arthritis    "thighs, hips, arms" (04/28/2015)  . Breast cancer ([place])    right mastectomy  . CAD (coronary artery disease)   .  Dehydration 12/06/2016  . Dementia   . Depression   . Diverticulitis   . GERD (gastroesophageal reflux disease)   . HCAP (healthcare-associated pneumonia) 09/15/2015  . Hyperlipidemia   . Hypertension   . Non-small cell carcinoma of lung, stage 3 ([place]) 04/30/2015  . Prediabetes   . Seasonal allergies   . Shortness of breath dyspnea   . Sleep apnea     dx'd 10/2014,refused cpap machine  . Urinary frequency   . UTI (urinary tract infection) 06/06/2015     Past Surgical History:  Procedure Laterality Date  . BREAST SURGERY    . CARDIOVERSION    .  Cataract surgery    . CESAREAN SECTION  X 2  . COLON SURGERY    . COLONOSCOPY    . COLONOSCOPY N/A 01/13/2017   Procedure: COLONOSCOPY;  Surgeon: Gatha Mayer, MD;  Location: [facility] ENDOSCOPY;  Service: Endoscopy;  Laterality: N/A;  . ESOPHAGOGASTRODUODENOSCOPY N/A 01/13/2017   Procedure: ESOPHAGOGASTRODUODENOSCOPY (EGD);  Surgeon: Gatha Mayer, MD;  Location: [facility];  Service: Endoscopy;  Laterality: N/A;  . LAPAROSCOPIC PARTIAL COLECTOMY N/A 02/08/2017   Procedure: LAPAROSCOPIC PARTIAL COLECTOMY;  Surgeon: Leighton Ruff, MD;  Location: [facility] ORS;  Service: General;  Laterality: N/A;  . MASTECTOMY Right   . MEDIASTINOSCOPY N/A 04/28/2015   Procedure: MEDIASTINOSCOPY;  Surgeon: [name], MD;  Location[facility];  Service: Thoracic;  Laterality: N/A;  . TONSILLECTOMY       Family History  Problem Relation Age of Onset  . Mental illness Mother      Social History   Social History  . Marital status: Widowed    Spouse name: N/A  . Number of children: 4  . Years of education: 12   Occupational History  . retired    Social History Main Topics  . Smoking status: Former Smoker    Packs/day: 1.00    Years: 35.00    Types: Cigarettes    Quit date: 11/28/1992  . Smokeless tobacco: Never Used     Comment: "stopped smoking at age 62"  . Alcohol use 5.4 oz/week    9 Glasses of wine per week     Comment: 04/28/2015 "drink 3, 6oz,   glasses of wine on Sat & Sun"  . Drug use: No  . Sexual activity: No   Other Topics Concern  . Not on file   Social History Narrative   Fun: Word search and read   Denies abuse and feels safe at home     Review of Systems  Constitutional: Denies fever, chills, fatigue, or significant weight gain/loss. HENT: Head: Denies headache or neck pain Ears: Denies changes in hearing, ringing in ears, earache, drainage Nose: Denies discharge, stuffiness, itching, nosebleed, sinus pain Throat: Denies sore throat, hoarseness, dry mouth, sores, thrush Eyes: Denies loss/changes in vision, pain, redness, blurry/double vision, flashing lights Cardiovascular: Denies chest pain/discomfort, tightness, palpitations, shortness of breath with activity, difficulty lying down, swelling, sudden awakening with shortness of breath Respiratory: Denies shortness of breath, cough, sputum production, wheezing Gastrointestinal: Denies dysphasia, heartburn, change in appetite, nausea, change in bowel habits, rectal bleeding, constipation, diarrhea, yellow skin or eyes Genitourinary: Denies frequency, urgency, burning/pain, blood in urine, incontinence, change in urinary strength. Musculoskeletal: Denies muscle/joint pain, stiffness, back pain, redness or swelling of joints, trauma Skin: Denies rashes, lumps, itching, dryness, color changes, or hair/nail changes Neurological: Denies dizziness, fainting, seizures, weakness, numbness, tingling, tremor Psychiatric - Denies nervousness, stress, depression or memory loss Endocrine: Denies heat or cold intolerance, sweating, frequent urination, excessive thirst, changes in appetite Hematologic: Denies ease of bruising or bleeding    Objective:     BP 118/62 (BP Location: Left Arm, Patient Position: Sitting, Cuff Size: Large)   Pulse 74   Temp 97.9 F (36.6 C) (Oral)   Resp 16   Ht '5\' 6"'  (1.676 m)   Wt 185 lb 6.4 oz (84.1 kg)   SpO2 96%   BMI 29.92 kg/m  Nursing  note and vital signs reviewed.  Physical Exam  Constitutional: She is oriented to person, place, and time. She appears well-developed and well-nourished.  HENT:  Head: Normocephalic.  Right  Ear: Hearing, tympanic membrane, external ear and ear canal normal.  Left Ear: Hearing, tympanic membrane, external ear and ear canal normal.  Nose: Nose normal.  Mouth/Throat: Uvula is midline, oropharynx is clear and moist and mucous membranes are normal.  Eyes: Pupils are equal, round, and reactive to light. Conjunctivae and EOM are normal.  Neck: Neck supple. No JVD present. No tracheal deviation present. No thyromegaly present.  Cardiovascular: Normal rate, regular rhythm, normal heart sounds and intact distal pulses.   Pulmonary/Chest: Effort normal and breath sounds normal.  Abdominal: Soft. Bowel sounds are normal. She exhibits no distension and no mass. There is no tenderness. There is no rebound and no guarding.  Musculoskeletal: Normal range of motion. She exhibits no edema or tenderness.  Lymphadenopathy:    She has no cervical adenopathy.  Neurological: She is alert and oriented to person, place, and time. She has normal reflexes. No cranial nerve deficit. She exhibits normal muscle tone. Coordination normal.  Skin: Skin is warm and dry.  Psychiatric: She has a normal mood and affect. Her behavior is normal. Judgment and thought content normal.       Assessment & Plan:   During the course of the visit the patient was educated and counseled about appropriate screening and preventive services including:    Pneumococcal vaccine   Influenza vaccine  Diabetes screening  Glaucoma screening  Nutrition counseling   Diet review for nutrition referral? Yes ____  Not Indicated _X___   Patient Instructions (the written plan) was given to the patient.  Medicare Attestation I have personally reviewed: The patient's medical and social history Their use of alcohol, tobacco or illicit  drugs Their current medications and supplements The patient's functional ability including ADLs,fall risks, home safety risks, cognitive, and hearing and visual impairment Diet and physical activities Evidence for depression or mood disorders  The patient's weight, height, BMI,  have been recorded in the chart.  I have made referrals, counseling, and provided education to the patient based on review of the above and I have provided the patient with a written personalized care plan for preventive services.     Problem List Items Addressed This Visit      Cardiovascular and Mediastinum   Paroxysmal a-fib ([place])    Stable and anticoagulated with Eliquis. Continue current dosage of Eliquis.       HTN (hypertension)    Blood pressure well controlled with current medication regimen and no adverse side effects. Continue current medication and encourage water blood pressure at home and follow low-sodium diet.        Respiratory   Adenocarcinoma of upper lobe of right lung    Stable with no current exacerbation of symptoms although does fatigue easily. Continue current medication regimen with follow-up and changes per oncology.        Digestive   Cancer of ascending colon s/p lap colectomy 02/08/2017    Stable currently with most recent resection by general surgery. Continue to monitor.        Other   Hyperlipidemia    Maintained on atorvastatin with no adverse side effects or myalgias. Obtain lipid profile. Continue current dosage of atorvastatin pending lipid profile results.      Medicare annual wellness visit, subsequent - Primary    Reviewed and updated patient's medical, surgical, family and social history. Medications and allergies were also reviewed. Basic screenings for depression, activities of daily living, hearing, cognition and safety were performed. Provider list was updated and health plan was  provided to the patient.       Encounter for general adult medical examination  with abnormal findings    1) Anticipatory Guidance: Discussed importance of wearing a seatbelt while driving and not texting while driving; changing batteries in smoke detector at least once annually; wearing suntan lotion when outside; eating a balanced and moderate diet; getting physical activity at least 30 minutes per day.  2) Immunizations / Screenings / Labs:  Declines tetanus. All other immunizations are up-to-date per recommendations. All screenings are up-to-date per recommendations. Obtain CBC, CMET, and lipid profile.    Overall fair exam with multiple comorbidities with non-small cell lung cancer, ascending colon cancer, and atrial fibrillation. Appears to be stable with current medication regimens. Encouraged to increase physical activity as able with goal of some walking daily. Follow-up prevention exam in 1 year. Follow-up office visit for chronic conditions pending blood work results.       Relevant Orders   Urinalysis   CBC (Completed)   Comp Met (CMET) (Completed)   Lipid Profile (Completed)       I am having Ms. Aboud maintain her carvedilol, albuterol, loperamide, CALCIUM-VITAMIN D PO, NUTRITIONAL SUPPLEMENT, multivitamin with minerals, OXYGEN, pantoprazole, oxyCODONE, oxybutynin, apixaban, buPROPion, fluticasone furoate-vilanterol, loratadine, LORazepam, escitalopram, atorvastatin, ketorolac, ofloxacin, and prednisoLONE acetate.   Follow-up: Return in about 3 months (around 10/10/2017), or if symptoms worsen or fail to improve.   Mauricio Po, FNP

## 2017-07-10 NOTE — Assessment & Plan Note (Signed)
Stable currently with most recent resection by general surgery. Continue to monitor.

## 2017-07-12 ENCOUNTER — Telehealth: Payer: Self-pay | Admitting: Family

## 2017-07-12 NOTE — Telephone Encounter (Signed)
Pt son in law (not on [place]) called stating the Pts med for a UTI was supposed to increased,  Please call [name] back [phone]

## 2017-07-12 NOTE — Telephone Encounter (Signed)
Can you send in a increased does of the oxybutinin please

## 2017-07-12 NOTE — Telephone Encounter (Signed)
She can take 1 tablet by mouth twice daily of the current medication.

## 2017-07-14 MED ORDER — OXYBUTYNIN CHLORIDE 5 MG PO TABS: ORAL_TABLET | ORAL | 0 refills | Status: DC

## 2017-07-14 NOTE — Addendum Note (Signed)
Addended by: Delice Bison E on: 07/14/2017 10:31 AM   Modules accepted: Orders

## 2017-07-14 NOTE — Telephone Encounter (Signed)
Medication sent to pharmacy  

## 2017-07-14 NOTE — Telephone Encounter (Signed)
Please send to [facility] on  [address]

## 2017-07-17 ENCOUNTER — Telehealth: Payer: Self-pay

## 2017-07-17 NOTE — Telephone Encounter (Signed)
Spoke with pts daughter [name] in regards to an assisted living form that was dropped off for pt. I told her that I pulled her pts son in law in a room to let him know the form could not be filled out without pt being aware. Pts daughter verified that pt is aware of the form and has already toured that assisted living facility and really liked it. Daughter also wanted to check on the urine that was done for pt and wanted to see if an antibiotic was needed. Please advise.

## 2017-07-22 ENCOUNTER — Other Ambulatory Visit: Payer: Self-pay | Admitting: Family

## 2017-07-22 DIAGNOSIS — F32A Depression, unspecified: Secondary | ICD-10-CM

## 2017-07-22 DIAGNOSIS — F329 Major depressive disorder, single episode, unspecified: Secondary | ICD-10-CM

## 2017-07-24 NOTE — Telephone Encounter (Signed)
Patient's daughter [name] called to follow up on this. She states [facility] is moving in on Friday they really need the form by before then. Please advise.

## 2017-07-25 DIAGNOSIS — Z0279 Encounter for issue of other medical certificate: Secondary | ICD-10-CM

## 2017-07-25 NOTE — Telephone Encounter (Signed)
Form has been faxed to [facility].

## 2017-07-26 ENCOUNTER — Telehealth: Payer: Self-pay | Admitting: Internal Medicine

## 2017-07-26 NOTE — Telephone Encounter (Signed)
New message    Pt daughter is calling. She is calling asking for the cpap machine to be discontinued. She said it has not been used since she got it. Please call.

## 2017-07-27 NOTE — Telephone Encounter (Signed)
Returned call to daughter (ok per DPR) advised they would need to take machine to DME company.  Daughter states that they tried and were told they needed orders.   Advised I will await Dr. Claiborne Billings to review.     Daughter verbalized understanding.  States patient has tried anxiety medication, different types of mask and patient cannot tolerate.   States they are having to pay for the machine and she does not use it.

## 2017-07-29 ENCOUNTER — Other Ambulatory Visit: Payer: Self-pay | Admitting: Internal Medicine

## 2017-08-01 NOTE — Telephone Encounter (Signed)
Form has been refaxed to the number listed below.

## 2017-08-01 NOTE — Telephone Encounter (Signed)
Pamela Mcguire from [facility] states they did not receive this fax please refax to Fax [phone]

## 2017-08-01 NOTE — Telephone Encounter (Signed)
Patient had severe sleep apnea by her sleep study in 2016 with an AHI of 45.  If she has not use it at all in over 2 years, is not too late to try to use it.  She may consider alternatives to CPAP including a customized oral appliance, but I'm doubtful she will achieve optimal treatment.  I would advise that the patient go back to her DME company to see if there is any potential mask changes that can be made to allow her improved tolerability for use.  Obviously, if she turns her machine back in there is nothing we can do and it is her decision.

## 2017-08-02 NOTE — Telephone Encounter (Signed)
Chelsey called back stating that what they received was not the correct form. She said that she is from [facility] which is [facility]. She said that the form she got yesterday was a different form for [employer]. The form that she is needing is an order for PT and OT for the pt. They are wanting to evaluate her today but need doctors orders before they can do this. She said that it was sent over on 08/30 and yesterday but she is going to send it again today.

## 2017-08-02 NOTE — Telephone Encounter (Signed)
I have faxed over the form for [employer].

## 2017-08-03 NOTE — Telephone Encounter (Signed)
Spoke to daughter (ok per [facility]) aware of recommendations.   Per Dr [facility], if patient wants to turn in CPAP machine without order they must sign AMA form.   Daughter aware and verbalized understanding.

## 2017-08-03 NOTE — Telephone Encounter (Signed)
New Message ° ° pt daughter verbalized that she is returning call for rn  °

## 2017-08-03 NOTE — Telephone Encounter (Signed)
[email or web address]

## 2017-08-05 ENCOUNTER — Emergency Department (HOSPITAL_COMMUNITY): Payer: Medicare HMO

## 2017-08-05 ENCOUNTER — Encounter (HOSPITAL_COMMUNITY): Payer: Self-pay | Admitting: Emergency Medicine

## 2017-08-05 ENCOUNTER — Observation Stay (HOSPITAL_COMMUNITY)
Admission: EM | Admit: 2017-08-05 | Discharge: 2017-08-09 | Disposition: A | Discharge status: Home / Self Care | Payer: Medicare HMO | Attending: Internal Medicine | Admitting: Internal Medicine

## 2017-08-05 DIAGNOSIS — Z87891 Personal history of nicotine dependence: Secondary | ICD-10-CM | POA: Diagnosis not present

## 2017-08-05 DIAGNOSIS — F419 Anxiety disorder, unspecified: Secondary | ICD-10-CM | POA: Insufficient documentation

## 2017-08-05 DIAGNOSIS — F329 Major depressive disorder, single episode, unspecified: Secondary | ICD-10-CM | POA: Insufficient documentation

## 2017-08-05 DIAGNOSIS — I480 Paroxysmal atrial fibrillation: Principal | ICD-10-CM | POA: Insufficient documentation

## 2017-08-05 DIAGNOSIS — Z853 Personal history of malignant neoplasm of breast: Secondary | ICD-10-CM | POA: Insufficient documentation

## 2017-08-05 DIAGNOSIS — G4730 Sleep apnea, unspecified: Secondary | ICD-10-CM | POA: Diagnosis not present

## 2017-08-05 DIAGNOSIS — Z7901 Long term (current) use of anticoagulants: Secondary | ICD-10-CM | POA: Diagnosis not present

## 2017-08-05 DIAGNOSIS — I10 Essential (primary) hypertension: Secondary | ICD-10-CM | POA: Diagnosis not present

## 2017-08-05 DIAGNOSIS — F0390 Unspecified dementia without behavioral disturbance: Secondary | ICD-10-CM | POA: Diagnosis not present

## 2017-08-05 DIAGNOSIS — E785 Hyperlipidemia, unspecified: Secondary | ICD-10-CM | POA: Insufficient documentation

## 2017-08-05 DIAGNOSIS — Z79899 Other long term (current) drug therapy: Secondary | ICD-10-CM | POA: Insufficient documentation

## 2017-08-05 DIAGNOSIS — I2510 Atherosclerotic heart disease of native coronary artery without angina pectoris: Secondary | ICD-10-CM | POA: Insufficient documentation

## 2017-08-05 DIAGNOSIS — N390 Urinary tract infection, site not specified: Secondary | ICD-10-CM | POA: Insufficient documentation

## 2017-08-05 DIAGNOSIS — J449 Chronic obstructive pulmonary disease, unspecified: Secondary | ICD-10-CM | POA: Insufficient documentation

## 2017-08-05 DIAGNOSIS — R0789 Other chest pain: Secondary | ICD-10-CM | POA: Diagnosis not present

## 2017-08-05 DIAGNOSIS — Z7951 Long term (current) use of inhaled steroids: Secondary | ICD-10-CM | POA: Diagnosis not present

## 2017-08-05 DIAGNOSIS — Z85118 Personal history of other malignant neoplasm of bronchus and lung: Secondary | ICD-10-CM | POA: Insufficient documentation

## 2017-08-05 DIAGNOSIS — Z85038 Personal history of other malignant neoplasm of large intestine: Secondary | ICD-10-CM | POA: Diagnosis not present

## 2017-08-05 DIAGNOSIS — I4891 Unspecified atrial fibrillation: Secondary | ICD-10-CM | POA: Diagnosis not present

## 2017-08-05 DIAGNOSIS — J9611 Chronic respiratory failure with hypoxia: Secondary | ICD-10-CM | POA: Diagnosis not present

## 2017-08-05 DIAGNOSIS — J90 Pleural effusion, not elsewhere classified: Secondary | ICD-10-CM

## 2017-08-05 DIAGNOSIS — Z9981 Dependence on supplemental oxygen: Secondary | ICD-10-CM | POA: Diagnosis not present

## 2017-08-05 DIAGNOSIS — R079 Chest pain, unspecified: Secondary | ICD-10-CM

## 2017-08-05 DIAGNOSIS — Z9011 Acquired absence of right breast and nipple: Secondary | ICD-10-CM | POA: Insufficient documentation

## 2017-08-05 DIAGNOSIS — Z9889 Other specified postprocedural states: Secondary | ICD-10-CM

## 2017-08-05 DIAGNOSIS — E876 Hypokalemia: Secondary | ICD-10-CM | POA: Diagnosis not present

## 2017-08-05 LAB — COMPREHENSIVE METABOLIC PANEL
ALT: 15 U/L (ref 14–54)
AST: 15 U/L (ref 15–41)
Albumin: 3.2 g/dL — ABNORMAL LOW (ref 3.5–5.0)
Alkaline Phosphatase: 47 U/L (ref 38–126)
Anion gap: 6 (ref 5–15)
BILIRUBIN TOTAL: 0.7 mg/dL (ref 0.3–1.2)
BUN: 15 mg/dL (ref 6–20)
CO2: 27 mmol/L (ref 22–32)
CREATININE: 0.97 mg/dL (ref 0.44–1.00)
Calcium: 8.6 mg/dL — ABNORMAL LOW (ref 8.9–10.3)
Chloride: 106 mmol/L (ref 101–111)
GFR calc Af Amer: >60 mL/min (ref >60)
GFR, EST NON AFRICAN AMERICAN: 54 mL/min — AB (ref >60)
Glucose, Bld: 128 mg/dL — ABNORMAL HIGH (ref 65–99)
Potassium: 3.9 mmol/L (ref 3.5–5.1)
Sodium: 139 mmol/L (ref 135–145)
Total Protein: 6.4 g/dL — ABNORMAL LOW (ref 6.5–8.1)

## 2017-08-05 LAB — POCT I-STAT, CHEM 8
BUN: 15 mg/dL (ref 6–20)
CHLORIDE: 103 mmol/L (ref 101–111)
CREATININE: 0.9 mg/dL (ref 0.44–1.00)
Calcium, Ion: 1.1 mmol/L — ABNORMAL LOW (ref 1.15–1.40)
Glucose, Bld: 128 mg/dL — ABNORMAL HIGH (ref 65–99)
HEMATOCRIT: 34 % — AB (ref 36.0–46.0)
HEMOGLOBIN: 11.6 g/dL — AB (ref 12.0–15.0)
POTASSIUM: 3.9 mmol/L (ref 3.5–5.1)
Sodium: 141 mmol/L (ref 135–145)
TCO2: 25 mmol/L (ref 22–32)

## 2017-08-05 LAB — PROTIME-INR
INR: 1.75
PROTHROMBIN TIME: 20.3 s — AB (ref 11.4–15.2)

## 2017-08-05 LAB — CBC
HEMATOCRIT: 34.7 % — AB (ref 36.0–46.0)
Hemoglobin: 11.1 g/dL — ABNORMAL LOW (ref 12.0–15.0)
MCH: 29.8 pg (ref 26.0–34.0)
MCHC: 32 g/dL (ref 30.0–36.0)
MCV: 93 fL (ref 78.0–100.0)
Platelets: 289 10*3/uL (ref 150–400)
RBC: 3.73 MIL/uL — ABNORMAL LOW (ref 3.87–5.11)
RDW: 15.8 % — AB (ref 11.5–15.5)
WBC: 5.7 10*3/uL (ref 4.0–10.5)

## 2017-08-05 LAB — POCT I-STAT TROPONIN I: TROPONIN I, POC: 0.03 ng/mL (ref 0.00–0.08)

## 2017-08-05 LAB — MAGNESIUM: MAGNESIUM: 1.6 mg/dL — AB (ref 1.7–2.4)

## 2017-08-05 LAB — MRSA PCR SCREENING: MRSA by PCR: NEGATIVE

## 2017-08-05 LAB — BRAIN NATRIURETIC PEPTIDE
B NATRIURETIC PEPTIDE 5: 447.6 pg/mL — AB (ref 0.0–100.0)
B NATRIURETIC PEPTIDE 5: 530.2 pg/mL — AB (ref 0.0–100.0)

## 2017-08-05 LAB — TSH: TSH: 2.049 u[IU]/mL (ref 0.350–4.500)

## 2017-08-05 MED ORDER — APIXABAN 5 MG PO TABS
5.0000 mg | ORAL_TABLET | Freq: Two times a day (BID) | ORAL | Status: DC
  Administered 2017-08-05 – 2017-08-06 (×3): 5 mg via ORAL
  Filled 2017-08-05 (×3): qty 1

## 2017-08-05 MED ORDER — LORAZEPAM 0.5 MG PO TABS
0.5000 mg | ORAL_TABLET | Freq: Four times a day (QID) | ORAL | Status: DC | PRN
  Administered 2017-08-08: 0.5 mg via ORAL
  Filled 2017-08-05: qty 1

## 2017-08-05 MED ORDER — FLUTICASONE FUROATE-VILANTEROL 100-25 MCG/INH IN AEPB
1.0000 | INHALATION_SPRAY | Freq: Every day | RESPIRATORY_TRACT | Status: DC
  Administered 2017-08-06 – 2017-08-09 (×4): 1 via RESPIRATORY_TRACT
  Filled 2017-08-05: qty 28

## 2017-08-05 MED ORDER — ACETAMINOPHEN 325 MG PO TABS
650.0000 mg | ORAL_TABLET | ORAL | Status: DC | PRN
  Administered 2017-08-09: 650 mg via ORAL
  Filled 2017-08-05: qty 2

## 2017-08-05 MED ORDER — ORAL CARE MOUTH RINSE
15.0000 mL | Freq: Two times a day (BID) | OROMUCOSAL | Status: DC
  Administered 2017-08-05 – 2017-08-09 (×7): 15 mL via OROMUCOSAL

## 2017-08-05 MED ORDER — IOPAMIDOL (ISOVUE-370) INJECTION 76%
INTRAVENOUS | Status: AC
  Administered 2017-08-05: 100 mL
  Filled 2017-08-05: qty 100

## 2017-08-05 MED ORDER — DEXTROSE 5 % IV SOLN
1.0000 g | INTRAVENOUS | Status: DC
  Administered 2017-08-05 – 2017-08-06 (×2): 1 g via INTRAVENOUS
  Filled 2017-08-05 (×3): qty 10

## 2017-08-05 MED ORDER — BUPROPION HCL ER (SR) 100 MG PO TB12: 100.0000 mg | ORAL_TABLET | Freq: Two times a day (BID) | ORAL | Status: DC

## 2017-08-05 MED ORDER — OXYCODONE HCL 5 MG PO TABS
2.5000 mg | ORAL_TABLET | Freq: Four times a day (QID) | ORAL | Status: DC | PRN
  Administered 2017-08-06 – 2017-08-08 (×3): 5 mg via ORAL
  Filled 2017-08-05 (×3): qty 1

## 2017-08-05 MED ORDER — ALBUTEROL SULFATE (2.5 MG/3ML) 0.083% IN NEBU: 2.5000 mg | INHALATION_SOLUTION | Freq: Four times a day (QID) | RESPIRATORY_TRACT | Status: DC | PRN

## 2017-08-05 MED ORDER — ONDANSETRON HCL 4 MG/2ML IJ SOLN
4.0000 mg | Freq: Four times a day (QID) | INTRAMUSCULAR | Status: DC | PRN
  Administered 2017-08-07: 4 mg via INTRAVENOUS
  Filled 2017-08-05: qty 2

## 2017-08-05 MED ORDER — ADULT MULTIVITAMIN W/MINERALS CH
1.0000 | ORAL_TABLET | Freq: Every day | ORAL | Status: DC
  Administered 2017-08-06 – 2017-08-09 (×4): 1 via ORAL
  Filled 2017-08-05 (×4): qty 1

## 2017-08-05 MED ORDER — ATORVASTATIN CALCIUM 40 MG PO TABS
40.0000 mg | ORAL_TABLET | Freq: Every day | ORAL | Status: DC
  Administered 2017-08-05 – 2017-08-08 (×4): 40 mg via ORAL
  Filled 2017-08-05 (×4): qty 1

## 2017-08-05 MED ORDER — ESCITALOPRAM OXALATE 10 MG PO TABS
10.0000 mg | ORAL_TABLET | Freq: Every day | ORAL | Status: DC
  Administered 2017-08-06 – 2017-08-09 (×4): 10 mg via ORAL
  Filled 2017-08-05 (×4): qty 1

## 2017-08-05 MED ORDER — BUPROPION HCL ER (SR) 100 MG PO TB12
100.0000 mg | ORAL_TABLET | Freq: Every day | ORAL | Status: DC
  Administered 2017-08-06 – 2017-08-09 (×4): 100 mg via ORAL
  Filled 2017-08-05 (×4): qty 1

## 2017-08-05 MED ORDER — CARVEDILOL 3.125 MG PO TABS
3.1250 mg | ORAL_TABLET | Freq: Two times a day (BID) | ORAL | Status: DC
  Administered 2017-08-05 – 2017-08-06 (×2): 3.125 mg via ORAL
  Filled 2017-08-05 (×2): qty 1

## 2017-08-05 MED ORDER — DILTIAZEM HCL 25 MG/5ML IV SOLN
10.0000 mg | Freq: Once | INTRAVENOUS | Status: AC
  Administered 2017-08-05: 10 mg via INTRAVENOUS
  Filled 2017-08-05: qty 5

## 2017-08-05 MED ORDER — FUROSEMIDE 10 MG/ML IJ SOLN
40.0000 mg | Freq: Every day | INTRAMUSCULAR | Status: DC
  Administered 2017-08-05 – 2017-08-08 (×4): 40 mg via INTRAVENOUS
  Filled 2017-08-05 (×5): qty 4

## 2017-08-05 MED ORDER — DILTIAZEM HCL-DEXTROSE 100-5 MG/100ML-% IV SOLN (PREMIX): 5.0000 mg/h | INTRAVENOUS | Status: DC

## 2017-08-05 MED ORDER — DILTIAZEM HCL-DEXTROSE 100-5 MG/100ML-% IV SOLN (PREMIX)
5.0000 mg/h | Freq: Once | INTRAVENOUS | Status: AC
  Administered 2017-08-05: 5 mg/h via INTRAVENOUS
  Filled 2017-08-05: qty 100

## 2017-08-05 MED ORDER — DILTIAZEM HCL 60 MG PO TABS
60.0000 mg | ORAL_TABLET | Freq: Four times a day (QID) | ORAL | Status: DC
  Administered 2017-08-05 – 2017-08-09 (×14): 60 mg via ORAL
  Filled 2017-08-05 (×14): qty 1

## 2017-08-05 NOTE — ED Provider Notes (Signed)
[facility] DEPT Provider Note   CSN: [id number] Arrival date & time: 08/05/17  1238     History   Chief Complaint Chief Complaint  Patient presents with  . Chest Pain  . Dizziness    HPI Cianna Kasparian is a 82 y.o. female hx of paroxysmal afib uncompliant with eliquis, dementia, previous breast and colon and lung cancers here with shortness of breath, dizziness. Patient was living at home and then moved to in assisted living about a week ago. Patient states that since she's been there she has been having worsening shortness of breath. She didn't take her L it was for the last 2 days as well. Denies any leg swelling or history of blood clots. She does have multiple cancers and is currently in remission.     The history is provided by the patient.    Past Medical History:  Diagnosis Date  . AF (atrial fibrillation) ([place])   . Anxiety   . Arthritis    "thighs, hips, arms" (04/28/2015)  . Breast cancer ([place])    right mastectomy  . CAD (coronary artery disease)   . Dehydration 12/06/2016  . Dementia   . Depression   . Diverticulitis   . GERD (gastroesophageal reflux disease)   . HCAP (healthcare-associated pneumonia) 09/15/2015  . Hyperlipidemia   . Hypertension   . Non-small cell carcinoma of lung, stage 3 ([place]) 04/30/2015  . Prediabetes   . Seasonal allergies   . Shortness of breath dyspnea   . Sleep apnea     dx'd 10/2014,refused cpap machine  . Urinary frequency   . UTI (urinary tract infection) 06/06/2015    Patient Active Problem List   Diagnosis Date Noted  . Atrial fibrillation with rapid ventricular response ([place]) 08/05/2017  . Medicare annual wellness visit, subsequent 07/10/2017  . Encounter for general adult medical examination with abnormal findings 07/10/2017  . Insomnia due to medical condition 04/18/2017  . Urinary frequency 04/18/2017  . Blurred vision 04/18/2017  . Cancer of ascending colon s/p lap colectomy 02/08/2017 02/08/2017  . Iron deficiency  anemia   . Fundic gland polyps of stomach, benign   . Benign neoplasm of transverse colon   . Benign neoplasm of descending colon   . Chronic anticoagulation 12/22/2016  . Dehydration 12/06/2016  . Rash and nonspecific skin eruption 06/21/2016  . Cough 06/21/2016  . Fracture of L1 vertebra ([place]) 10/19/2015  . Weakness   . Compression fracture 09/13/2015  . Fall at home 09/13/2015  . History of lung cancer   . Mobility impaired   . Fatigue 08/18/2015  . Depression 08/18/2015  . Frequent stools 08/18/2015  . Tachypnea 06/22/2015  . SIRS (systemic inflammatory response syndrome) ([place]) 06/22/2015  . Conjunctivitis 06/06/2015  . Adenocarcinoma of upper lobe of right lung 04/30/2015  . Mediastinal adenopathy 04/24/2015  . Severe obstructive sleep apnea 03/27/2015  . Lung mass 03/27/2015  . Folate deficiency 09/21/2014  . Paroxysmal a-fib ([place]) 05/27/2014  . DOE (dyspnea on exertion) 05/27/2014  . HTN (hypertension) 05/27/2014  . Dyslipidemia 05/27/2014  . Bradycardia 05/27/2014  . Hair loss 05/27/2014  . Breast cancer ([place]) 04/17/2014  . Prediabetes 04/17/2014  . CAD (coronary artery disease) 04/16/2014  . Diverticulitis 04/16/2014  . Hyperlipidemia 04/16/2014    Past Surgical History:  Procedure Laterality Date  . BREAST SURGERY    . CARDIOVERSION    . Cataract surgery    . CESAREAN SECTION  X 2  . COLON SURGERY    . COLONOSCOPY    .  COLONOSCOPY N/A 01/13/2017   Procedure: COLONOSCOPY;  Surgeon: Gatha Mayer, MD;  Location: [facility] ENDOSCOPY;  Service: Endoscopy;  Laterality: N/A;  . ESOPHAGOGASTRODUODENOSCOPY N/A 01/13/2017   Procedure: ESOPHAGOGASTRODUODENOSCOPY (EGD);  Surgeon: Gatha Mayer, MD;  Location: [facility];  Service: Endoscopy;  Laterality: N/A;  . LAPAROSCOPIC PARTIAL COLECTOMY N/A 02/08/2017   Procedure: LAPAROSCOPIC PARTIAL COLECTOMY;  Surgeon: Leighton Ruff, MD;  Location: [facility] ORS;  Service: General;  Laterality: N/A;  . MASTECTOMY Right   .  MEDIASTINOSCOPY N/A 04/28/2015   Procedure: MEDIASTINOSCOPY;  Surgeon: [name], MD;  Location: [facility] OR;  Service: Thoracic;  Laterality: N/A;  . TONSILLECTOMY      OB History    No data available       Home Medications    Prior to Admission medications   Medication Sig Start Date End Date Taking? Authorizing Provider  albuterol (PROVENTIL HFA;VENTOLIN HFA) 108 (90 Base) MCG/ACT inhaler Inhale 1 puff into the lungs every 6 (six) hours as needed for wheezing or shortness of breath. Reported on 06/01/2016 11/15/16  Yes Golden Circle, FNP  apixaban (ELIQUIS) 5 MG TABS tablet Take 1 tablet by mouth twice daily. Appoint,ent needed for further refills - call cardiologist to schedule follow up visit- 08/01/17  Yes Hilty, Nadean Corwin, MD  atorvastatin (LIPITOR) 40 MG tablet TAKE 1 TABLET(40 MG) BY MOUTH DAILY 07/03/17  Yes Hilty, Nadean Corwin, MD  buPROPion (WELLBUTRIN SR) 100 MG 12 hr tablet TAKE 1 TABLET(100 MG) BY MOUTH DAILY 05/17/17  Yes Golden Circle, FNP  CALCIUM-VITAMIN D PO Take 1 tablet by mouth daily.   Yes [provider]  carvedilol (COREG) 3.125 MG tablet TAKE 1 TABLET(3.125 MG) BY MOUTH TWICE DAILY 06/27/16  Yes Hilty, Nadean Corwin, MD  escitalopram (LEXAPRO) 10 MG tablet TAKE 1 TABLET(10 MG) BY MOUTH DAILY. FOLLOW-UP APPOINTMENT IS DUE 07/24/17  Yes Golden Circle, FNP  fluticasone furoate-vilanterol (BREO ELLIPTA) 100-25 MCG/INH AEPB Inhale 1 puff into the lungs daily. 05/17/17  Yes Golden Circle, FNP  loratadine (CLARITIN) 10 MG tablet TAKE 1 TABLET BY MOUTH AT BEDTIME 06/12/17  Yes Golden Circle, FNP  LORazepam (ATIVAN) 2 MG tablet TAKE 1 TABLET BY MOUTH AT BEDTIME AS NEEDED FOR ANXIETY 06/19/17  Yes Golden Circle, FNP  Multiple Vitamins-Minerals (MULTIVITAMIN WITH MINERALS) tablet Take 1 tablet by mouth daily.   Yes [provider]  NUTRITIONAL SUPPLEMENT LIQD Take 120 mLs by mouth 3 (three) times daily. [employer]   Yes [provider]  oxybutynin  (DITROPAN) 5 MG tablet TAKE 1 TABLET BY MOUTH TWICE DAILY 07/14/17  Yes Golden Circle, FNP  oxyCODONE (OXY IR/ROXICODONE) 5 MG immediate release tablet Take 0.5-1 tablets (2.5-5 mg total) by mouth every 6 (six) hours as needed for severe pain. 04/18/17  Yes Golden Circle, FNP  pantoprazole (PROTONIX) 40 MG tablet TAKE 1 TABLET BY MOUTH EVERY DAY 03/13/17  Yes Golden Circle, FNP  ofloxacin (OCUFLOX) 0.3 % ophthalmic solution  06/22/17   [provider]  OXYGEN Inhale 2 L into the lungs as needed.    [provider]  prednisoLONE acetate (PRED FORTE) 1 % ophthalmic suspension INT 1 GTT INTO SURGICAL EYE QID AFTER SURGERY 06/22/17   [provider]    Family History Family History  Problem Relation Age of Onset  . Mental illness Mother     Social History Social History  Substance Use Topics  . Smoking status: Former Smoker    Packs/day: 1.00  Years: 35.00    Types: Cigarettes    Quit date: 11/28/1992  . Smokeless tobacco: Never Used     Comment: "stopped smoking at age 34"  . Alcohol use 5.4 oz/week    9 Glasses of wine per week     Comment: 04/28/2015 "drink 3, 6oz,  glasses of wine on Sat & Sun"     Allergies   Patient has no known allergies.   Review of Systems Review of Systems  Cardiovascular: Positive for chest pain.  Neurological: Positive for dizziness.  All other systems reviewed and are negative.    Physical Exam Updated Vital Signs BP (!) 142/101 (BP Location: Left Arm)   Pulse (!) 113   Temp 98.2 F (36.8 C) (Oral)   Resp (!) 24   SpO2 94%   Physical Exam  Constitutional: She is oriented to person, place, and time.  Chronically ill, uncomfortable   HENT:  Head: Normocephalic.  Mouth/Throat: Oropharynx is clear and moist.  Eyes: Pupils are equal, round, and reactive to light. Conjunctivae and EOM are normal.  Neck: Normal range of motion. Neck supple.  Cardiovascular:  Tachy, irregular   Pulmonary/Chest:  +  crackles bilateral bases   Abdominal: Soft. Bowel sounds are normal. She exhibits no distension. There is no tenderness.  Musculoskeletal:  No calf tenderness bilaterally   Neurological: She is alert and oriented to person, place, and time.  Skin: Skin is warm.  Psychiatric: She has a normal mood and affect.  Nursing note and vitals reviewed.    ED Treatments / Results  Labs (all labs ordered are listed, but only abnormal results are displayed) Labs Reviewed  CBC - Abnormal; Notable for the following:       Result Value   RBC 3.73 (*)    Hemoglobin 11.1 (*)    HCT 34.7 (*)    RDW 15.8 (*)    All other components within normal limits  COMPREHENSIVE METABOLIC PANEL - Abnormal; Notable for the following:    Glucose, Bld 128 (*)    Calcium 8.6 (*)    Total Protein 6.4 (*)    Albumin 3.2 (*)    GFR calc non Af Amer 54 (*)    All other components within normal limits  PROTIME-INR - Abnormal; Notable for the following:    Prothrombin Time 20.3 (*)    All other components within normal limits  POCT I-STAT, CHEM 8 - Abnormal; Notable for the following:    Glucose, Bld 128 (*)    Calcium, Ion 1.10 (*)    Hemoglobin 11.6 (*)    HCT 34.0 (*)    All other components within normal limits  BRAIN NATRIURETIC PEPTIDE  I-STAT TROPONIN, ED  I-STAT CHEM 8, ED  POCT I-STAT TROPONIN I    EKG  EKG Interpretation  Date/Time:  Saturday August 05 2017 13:00:23 EDT Ventricular Rate:  132 PR Interval:    QRS Duration: 73 QT Interval:  337 QTC Calculation: 500 R Axis:   90 Text Interpretation:  Atrial fibrillation Borderline right axis deviation Borderline low voltage, extremity leads Borderline prolonged QT interval afib new since previous  Confirmed by Wandra Arthurs [phone]) on 08/05/2017 1:51:20 PM       Radiology Dg Chest 2 View  Result Date: 08/05/2017 CLINICAL DATA:  Right chest pain. History of lung cancer breast cancer colon cancer EXAM: CHEST  2 VIEW COMPARISON:  CT chest  07/04/2017.  Chest x-ray 09/08/2016 FINDINGS: Right upper lobe density most compatible with post  radiation scarring similar to the prior CT. Associated pleural thickening in the right apex also unchanged. Elevated right hemidiaphragm with right lower lobe atelectasis. Left lung clear Heart size upper normal.  Negative for heart failure or edema. IMPRESSION: Chronic scarring right upper lobe related to prior cancer treatment. No superimposed acute abnormality. Electronically Signed   By: Franchot Gallo M.D.   On: 08/05/2017 13:56   Ct Angio Chest Pe W And/or Wo Contrast  Result Date: 08/05/2017 CLINICAL DATA:  Patient with history of lung, breast and colon cancer. Chest pain. Shortness of breath. Evaluate for PE. EXAM: CT ANGIOGRAPHY CHEST WITH CONTRAST TECHNIQUE: Multidetector CT imaging of the chest was performed using the standard protocol during bolus administration of intravenous contrast. Multiplanar CT image reconstructions and MIPs were obtained to evaluate the vascular anatomy. CONTRAST:  100 cc Isovue 370 COMPARISON:  CT CAP 07/04/2017 FINDINGS: Cardiovascular: Heart is enlarged. No pericardial effusion. Coronary arterial vascular calcifications. Adequate opacification of the pulmonary arterial system. Motion artifact limits evaluation. No evidence for acute pulmonary embolus. Mediastinum/Nodes: Right axillary surgical clips. No left axillary adenopathy. Interval development of a 1.3 cm prevascular lymph node (image 30; series 4). Lungs/Pleura: Expiratory phase of imaging. Interval development of large right and moderate left layering pleural effusions. New bilateral patchy ground-glass pulmonary opacities and interlobular septal thickening. Right mid and upper lung paramediastinal post radiation changes, largely obscured due to large right pleural effusion. Re- demonstrated 10 mm right lower lobe pulmonary nodule (image 58; series 11). Upper Abdomen: No acute process. Musculoskeletal: Thoracic spine  degenerative changes. No aggressive or acute appearing osseous lesions. Review of the MIP images confirms the above findings. IMPRESSION: 1. No evidence for acute pulmonary embolus. 2. Interval development of large right and moderate left pleural effusions resulting in subpleural consolidative opacities within the bilateral lungs. 3. New bilateral ground-glass pulmonary opacities and interlobular septal thickening most suggestive of pulmonary edema. 4. Difficult to assess/evaluate right paramediastinal post radiation changes given distortion from large right pleural effusion. 5. Aortic atherosclerosis. 6. Interval development of mildly enlarged prevascular lymph node. Recommend attention on follow-up. Electronically Signed   By: Lovey Newcomer M.D.   On: 08/05/2017 16:00    Procedures Procedures (including critical care time)   CRITICAL CARE Performed by: Wandra Arthurs   Total critical care time: 40  minutes  Critical care time was exclusive of separately billable procedures and treating other patients.  Critical care was necessary to treat or prevent imminent or life-threatening deterioration.  Critical care was time spent personally by me on the following activities: development of treatment plan with patient and/or surrogate as well as nursing, discussions with consultants, evaluation of patient's response to treatment, examination of patient, obtaining history from patient or surrogate, ordering and performing treatments and interventions, ordering and review of laboratory studies, ordering and review of radiographic studies, pulse oximetry and re-evaluation of patient's condition.  Medications Ordered in ED Medications  diltiazem (CARDIZEM) injection 10 mg (10 mg Intravenous Given 08/05/17 1503)  diltiazem (CARDIZEM) 100 mg in dextrose 5% 168mL (1 mg/mL) infusion (5 mg/hr Intravenous New Bag/Given 08/05/17 1549)  iopamidol (ISOVUE-370) 76 % injection (100 mLs  Contrast Given 08/05/17 1529)      Initial Impression / Assessment and Plan / ED Course  I have reviewed the triage vital signs and the nursing notes.  Pertinent labs & imaging results that were available during my care of the patient were reviewed by me and considered in my medical decision making (see chart for  details).     Unika Nazareno is a 82 y.o. female here with shortness of breath, rapid afib. Remote hx of afib but not compliant with eliquis. CHADVASC > 3 and needs anticoagulation. Also hx of multiple cancers so at risk for PE. Will get labs, CT angio chest. Will start cardizem bolus and drip.   4:31 PM CTA showed bilateral pleural effusions, worse on the right side. No obvious recurrent mass or PE. HR still 110s even on cardizem drip. Will admit to stepdown. Shortness of breath likely from recurrent cancer vs heart failure exacerbation. Will add on BNP. Hospitalist to admit and will arrange for thoracentesis.   Final Clinical Impressions(s) / ED Diagnoses   Final diagnoses:  Pleural effusion  Atrial fibrillation with RVR [facility])    New Prescriptions New Prescriptions   No medications on file     Drenda Freeze, MD 08/05/17 [phone]

## 2017-08-05 NOTE — ED Notes (Signed)
Patient transported to CT 

## 2017-08-05 NOTE — ED Notes (Signed)
2 unsucessful IV attempt by this RN.  Another RN to attempt.

## 2017-08-05 NOTE — Progress Notes (Signed)
PHARMACY NOTE -   Ceftriaxone  Pharmacy has been assisting with dosing of ceftriaxone 1 gr IV q24h for UTI. Dosage remains stable at above dosage  and need for further dosage adjustment appears unlikely at present.    Will sign off at this time.  Please reconsult if a change in clinical status warrants re-evaluation of dosage.   Royetta Asal, PharmD, BCPS Pager [phone] 08/05/2017 6:33 PM

## 2017-08-05 NOTE — H&P (Signed)
History and Physical    Pamela Mcguire OEV:[id number] DOB: 12-24-1934 DOA: 08/05/2017  Referring MD/NP/PA:  PCP: Golden Circle, FNP  Outpatient Specialists: Dr. Earlie Server, oncology  Patient coming from: Home  Chief Complaint: Shortness of breath  HPI: Pamela Mcguire is a 82 y.o. female with medical history significant for but not limited to A. fib, COPD on home oxygen, cancers of breast, lung and colon in remission, presenting with 5 day history of progressively worsening shortness of breath and easy fatigability acid with diffuse sternal nonradiating chest pain. Does have some mild dizziness without vertigo. No history of fever or chills. ED Course: At the ED patient was noted to be tachycardic and 12 in the EKG revealed A. fib with rapid ventricular response. Chest CT angiogram which was negative for PE but revealed bilateral pleural effusion. IV Cardizem bolus with Cardizem drip was initiated and patient admitted for further management  Review of Systems: As per HPI otherwise 10 point review of systems negative.    Past Medical History:  Diagnosis Date  . AF (atrial fibrillation) ([place])   . Anxiety   . Arthritis    "thighs, hips, arms" (04/28/2015)  . Breast cancer ([place])    right mastectomy  . CAD (coronary artery disease)   . Dehydration 12/06/2016  . Dementia   . Depression   . Diverticulitis   . GERD (gastroesophageal reflux disease)   . HCAP (healthcare-associated pneumonia) 09/15/2015  . Hyperlipidemia   . Hypertension   . Non-small cell carcinoma of lung, stage 3 ([place]) 04/30/2015  . Prediabetes   . Seasonal allergies   . Shortness of breath dyspnea   . Sleep apnea     dx'd 10/2014,refused cpap machine  . Urinary frequency   . UTI (urinary tract infection) 06/06/2015    Past Surgical History:  Procedure Laterality Date  . BREAST SURGERY    . CARDIOVERSION    . Cataract surgery    . CESAREAN SECTION  X 2  . COLON SURGERY    . COLONOSCOPY    . COLONOSCOPY N/A 01/13/2017    Procedure: COLONOSCOPY;  Surgeon: Gatha Mayer, MD;  Location: [facility]COPY;  Service: Endoscopy;  Laterality: N/A;  . ESOPHAGOGASTRODUODENOSCOPY N/A 01/13/2017   Procedure: ESOPHAGOGASTRODUODENOSCOPY (EGD);  Surgeon: Gatha Mayer, MD;  Location: [facility];  Service: Endoscopy;  Laterality: N/A;  . LAPAROSCOPIC PARTIAL COLECTOMY N/A 02/08/2017   Procedure: LAPAROSCOPIC PARTIAL COLECTOMY;  Surgeon: Leighton Ruff, MD;  Location: [facility] ORS;  Service: General;  Laterality: N/A;  . MASTECTOMY Right   . MEDIASTINOSCOPY N/A 04/28/2015   Procedure: MEDIASTINOSCOPY;  Surgeon: [name], MD;  Location: [facility] OR;  Service: Thoracic;  Laterality: N/A;  . TONSILLECTOMY       reports that she quit smoking about 24 years ago. Her smoking use included Cigarettes. She has a 35.00 pack-year smoking history. She has never used smokeless tobacco. She reports that she drinks about 5.4 oz of alcohol per week . She reports that she does not use drugs.  Not on File  Family History  Problem Relation Age of Onset  . Mental illness Mother      Prior to Admission medications   Medication Sig Start Date End Date Taking? Authorizing Provider  albuterol (PROVENTIL HFA;VENTOLIN HFA) 108 (90 Base) MCG/ACT inhaler Inhale 1 puff into the lungs every 6 (six) hours as needed for wheezing or shortness of breath. Reported on 06/01/2016 11/15/16  Yes Golden Circle, FNP  apixaban (ELIQUIS) 5 MG TABS tablet Take  1 tablet by mouth twice daily. Appoint,ent needed for further refills - call cardiologist to schedule follow up visit- 08/01/17  Yes Hilty, Nadean Corwin, MD  atorvastatin (LIPITOR) 40 MG tablet TAKE 1 TABLET(40 MG) BY MOUTH DAILY 07/03/17  Yes Hilty, Nadean Corwin, MD  buPROPion (WELLBUTRIN SR) 100 MG 12 hr tablet TAKE 1 TABLET(100 MG) BY MOUTH DAILY 05/17/17  Yes Golden Circle, FNP  CALCIUM-VITAMIN D PO Take 1 tablet by mouth daily.   Yes [provider]  carvedilol (COREG) 3.125 MG tablet TAKE 1 TABLET(3.125  MG) BY MOUTH TWICE DAILY 06/27/16  Yes Hilty, Nadean Corwin, MD  escitalopram (LEXAPRO) 10 MG tablet TAKE 1 TABLET(10 MG) BY MOUTH DAILY. FOLLOW-UP APPOINTMENT IS DUE 07/24/17  Yes Golden Circle, FNP  fluticasone furoate-vilanterol (BREO ELLIPTA) 100-25 MCG/INH AEPB Inhale 1 puff into the lungs daily. 05/17/17  Yes Golden Circle, FNP  loratadine (CLARITIN) 10 MG tablet TAKE 1 TABLET BY MOUTH AT BEDTIME 06/12/17  Yes Golden Circle, FNP  LORazepam (ATIVAN) 2 MG tablet TAKE 1 TABLET BY MOUTH AT BEDTIME AS NEEDED FOR ANXIETY 06/19/17  Yes Golden Circle, FNP  Multiple Vitamins-Minerals (MULTIVITAMIN WITH MINERALS) tablet Take 1 tablet by mouth daily.   Yes [provider]  NUTRITIONAL SUPPLEMENT LIQD Take 120 mLs by mouth 3 (three) times daily. [employer]   Yes [provider]  oxybutynin (DITROPAN) 5 MG tablet TAKE 1 TABLET BY MOUTH TWICE DAILY 07/14/17  Yes Golden Circle, FNP  oxyCODONE (OXY IR/ROXICODONE) 5 MG immediate release tablet Take 0.5-1 tablets (2.5-5 mg total) by mouth every 6 (six) hours as needed for severe pain. 04/18/17  Yes Golden Circle, FNP  pantoprazole (PROTONIX) 40 MG tablet TAKE 1 TABLET BY MOUTH EVERY DAY 03/13/17  Yes Golden Circle, FNP  ofloxacin (OCUFLOX) 0.3 % ophthalmic solution  06/22/17   [provider]  OXYGEN Inhale 2 L into the lungs as needed.    [provider]  prednisoLONE acetate (PRED FORTE) 1 % ophthalmic suspension INT 1 GTT INTO SURGICAL EYE QID AFTER SURGERY 06/22/17   [provider]    Physical Exam: Vitals:   08/05/17 1544 08/05/17 1600 08/05/17 1615 08/05/17 1630  BP: (!) 142/101 105/89  (!) 141/99  Pulse: (!) 113 (!) 28 (!) 119 (!) 118  Resp: (!) 24 (!) 32 (!) 34 (!) 36  Temp:      TempSrc:      SpO2: 94% (!) 89% 93% 94%      Constitutional: NAD, calm, comfortable Vitals:   08/05/17 1544 08/05/17 1600 08/05/17 1615 08/05/17 1630  BP: (!) 142/101 105/89  (!) 141/99  Pulse: (!) 113 (!)  28 (!) 119 (!) 118  Resp: (!) 24 (!) 32 (!) 34 (!) 36  Temp:      TempSrc:      SpO2: 94% (!) 89% 93% 94%   Eyes: PERRL, lids and conjunctivae normal ENMT: Mucous membranes are moist. Posterior pharynx clear of any exudate or lesions.Normal dentition.  Neck: normal, supple, no masses, no thyromegaly Respiratory: bilateral diminished breath sounds with few occasional wheezes. Normal respiratory effort. No accessory muscle use.  Cardiovascular: Tachycardic, iregular rhythm, no murmurs / rubs / gallops. No extremity edema. 2+ pedal pulses. No carotid bruits.  Abdomen: no tenderness, no masses palpated. No hepatosplenomegaly. Bowel sounds positive.  Musculoskeletal: no clubbing / cyanosis. No joint deformity upper and lower extremities. Good ROM, no contractures. Normal muscle tone.  Skin: no rashes, lesions, ulcers. No induration  Neurologic: Alert and responsive Psychiatric: Normal judgment and insight. Normal mood.    Labs on Admission: I have personally reviewed following labs and imaging studies  CBC:  Recent Labs Lab 08/05/17 1305 08/05/17 1445  WBC 5.7  --   HGB 11.1* 11.6*  HCT 34.7* 34.0*  MCV 93.0  --   PLT 289  --    Basic Metabolic Panel:  Recent Labs Lab 08/05/17 1351 08/05/17 1445  NA 139 141  K 3.9 3.9  CL 106 103  CO2 27  --   GLUCOSE 128* 128*  BUN 15 15  CREATININE 0.97 0.90  CALCIUM 8.6*  --    GFR: CrCl cannot be calculated (Unknown ideal weight.). Liver Function Tests:  Recent Labs Lab 08/05/17 1351  AST 15  ALT 15  ALKPHOS 47  BILITOT 0.7  PROT 6.4*  ALBUMIN 3.2*   No results for input(s): LIPASE, AMYLASE in the last 168 hours. No results for input(s): AMMONIA in the last 168 hours. Coagulation Profile:  Recent Labs Lab 08/05/17 1351  INR 1.75   Cardiac Enzymes: No results for input(s): CKTOTAL, CKMB, CKMBINDEX, TROPONINI in the last 168 hours. BNP (last 3 results) No results for input(s): PROBNP in the last 8760  hours. HbA1C: No results for input(s): HGBA1C in the last 72 hours. CBG: No results for input(s): GLUCAP in the last 168 hours. Lipid Profile: No results for input(s): CHOL, HDL, LDLCALC, TRIG, CHOLHDL, LDLDIRECT in the last 72 hours. Thyroid Function Tests: No results for input(s): TSH, T4TOTAL, FREET4, T3FREE, THYROIDAB in the last 72 hours. Anemia Panel: No results for input(s): VITAMINB12, FOLATE, FERRITIN, TIBC, IRON, RETICCTPCT in the last 72 hours. Urine analysis:    Component Value Date/Time   COLORURINE YELLOW 07/10/2017 1633   APPEARANCEUR CLEAR 07/10/2017 1633   LABSPEC >=1.030 (A) 07/10/2017 1633   LABSPEC 1.010 06/02/2015 1706   PHURINE 6.0 07/10/2017 1633   GLUCOSEU NEGATIVE 07/10/2017 1633   GLUCOSEU Negative 06/02/2015 1706   HGBUR MODERATE (A) 07/10/2017 1633   BILIRUBINUR NEGATIVE 07/10/2017 1633   BILIRUBINUR 3+      4mg /dl 09/08/2016 1133   KETONESUR TRACE (A) 07/10/2017 1633   PROTEINUR 1+   30mg /dl 09/08/2016 1133   PROTEINUR NEGATIVE 09/14/2015 1950   UROBILINOGEN 0.2 07/10/2017 1633   UROBILINOGEN 0.2 06/02/2015 1706   NITRITE NEGATIVE 07/10/2017 1633   LEUKOCYTESUR TRACE (A) 07/10/2017 1633   LEUKOCYTESUR Moderate 06/02/2015 1706   Sepsis Labs: @LABRCNTIP (procalcitonin:4,lacticidven:4) )No results found for this or any previous visit (from the past 240 hour(s)).   Radiological Exams on Admission: Dg Chest 2 View  Result Date: 08/05/2017 CLINICAL DATA:  Right chest pain. History of lung cancer breast cancer colon cancer EXAM: CHEST  2 VIEW COMPARISON:  CT chest 07/04/2017.  Chest x-ray 09/08/2016 FINDINGS: Right upper lobe density most compatible with post radiation scarring similar to the prior CT. Associated pleural thickening in the right apex also unchanged. Elevated right hemidiaphragm with right lower lobe atelectasis. Left lung clear Heart size upper normal.  Negative for heart failure or edema. IMPRESSION: Chronic scarring right upper lobe  related to prior cancer treatment. No superimposed acute abnormality. Electronically Signed   By: Franchot Gallo M.D.   On: 08/05/2017 13:56   Ct Angio Chest Pe W And/or Wo Contrast  Result Date: 08/05/2017 CLINICAL DATA:  Patient with history of lung, breast and colon cancer. Chest pain. Shortness of breath. Evaluate for PE. EXAM: CT ANGIOGRAPHY CHEST WITH CONTRAST TECHNIQUE: Multidetector CT imaging of the  chest was performed using the standard protocol during bolus administration of intravenous contrast. Multiplanar CT image reconstructions and MIPs were obtained to evaluate the vascular anatomy. CONTRAST:  100 cc Isovue 370 COMPARISON:  CT CAP 07/04/2017 FINDINGS: Cardiovascular: Heart is enlarged. No pericardial effusion. Coronary arterial vascular calcifications. Adequate opacification of the pulmonary arterial system. Motion artifact limits evaluation. No evidence for acute pulmonary embolus. Mediastinum/Nodes: Right axillary surgical clips. No left axillary adenopathy. Interval development of a 1.3 cm prevascular lymph node (image 30; series 4). Lungs/Pleura: Expiratory phase of imaging. Interval development of large right and moderate left layering pleural effusions. New bilateral patchy ground-glass pulmonary opacities and interlobular septal thickening. Right mid and upper lung paramediastinal post radiation changes, largely obscured due to large right pleural effusion. Re- demonstrated 10 mm right lower lobe pulmonary nodule (image 58; series 11). Upper Abdomen: No acute process. Musculoskeletal: Thoracic spine degenerative changes. No aggressive or acute appearing osseous lesions. Review of the MIP images confirms the above findings. IMPRESSION: 1. No evidence for acute pulmonary embolus. 2. Interval development of large right and moderate left pleural effusions resulting in subpleural consolidative opacities within the bilateral lungs. 3. New bilateral ground-glass pulmonary opacities and  interlobular septal thickening most suggestive of pulmonary edema. 4. Difficult to assess/evaluate right paramediastinal post radiation changes given distortion from large right pleural effusion. 5. Aortic atherosclerosis. 6. Interval development of mildly enlarged prevascular lymph node. Recommend attention on follow-up. Electronically Signed   By: Lovey Newcomer M.D.   On: 08/05/2017 16:00    EKG: Independently reviewed.   Assessment/Plan Principal Problem:   Atrial fibrillation with rapid ventricular response (HCC)   #1 A. fib with rapid ventricular response: ChadsVasc>3 On Eliqus Rate controlled with Cardizem drip, switched to oral as needed Check troponin  #2 bilateral pleural effusion: Etiology-possibly related to malignancy?? pneumonia-consolidation per CT  Ultrasound-guided thoracentesis Cover with antibiotics for presumptive pulmonary infection Check BNP and consider diuretics as needed  #3 UTI: Antibiotics Check urine culture     DVT prophylaxis: Eliquis Code Status: (Full) Family Communication: Son and daughter at bedside Disposition Plan: To be determined  Consults called:  Admission status: (inpatient / SDU)   Pamela Mcguire,Addasyn Mcbreen MD [facility] Pager [phone]  If 7PM-7AM, please contact night-coverage [email or web address] Password [facility]  08/05/2017, 5:41 PM

## 2017-08-05 NOTE — ED Triage Notes (Signed)
Pt reports CP for the last 5 days accompanied by dizziness. Accompanied by SOB. Hx of lung, breast, and colon cancer in remission. Hx of COPD. On O2 at home, 2L [place]. Pt recently moved to assistive living recently.

## 2017-08-06 DIAGNOSIS — I4891 Unspecified atrial fibrillation: Secondary | ICD-10-CM | POA: Diagnosis not present

## 2017-08-06 LAB — HEMOGLOBIN A1C
Hgb A1c MFr Bld: 6 % — ABNORMAL HIGH (ref 4.8–5.6)
MEAN PLASMA GLUCOSE: 125.5 mg/dL

## 2017-08-06 LAB — LIPID PANEL
CHOL/HDL RATIO: 3.6 ratio
CHOLESTEROL: 134 mg/dL (ref 0–200)
HDL: 37 mg/dL — ABNORMAL LOW (ref >40)
LDL Cholesterol: 78 mg/dL (ref 0–99)
Triglycerides: 94 mg/dL (ref <150)
VLDL: 19 mg/dL (ref 0–40)

## 2017-08-06 LAB — TROPONIN I: Troponin I: <0.03 ng/mL (ref <0.03)

## 2017-08-06 LAB — BASIC METABOLIC PANEL
Anion gap: 11 (ref 5–15)
BUN: 15 mg/dL (ref 6–20)
CALCIUM: 8.7 mg/dL — AB (ref 8.9–10.3)
CO2: 29 mmol/L (ref 22–32)
CREATININE: 1.01 mg/dL — AB (ref 0.44–1.00)
Chloride: 100 mmol/L — ABNORMAL LOW (ref 101–111)
GFR calc Af Amer: 59 mL/min — ABNORMAL LOW (ref >60)
GFR calc non Af Amer: 51 mL/min — ABNORMAL LOW (ref >60)
Glucose, Bld: 128 mg/dL — ABNORMAL HIGH (ref 65–99)
POTASSIUM: 3.7 mmol/L (ref 3.5–5.1)
SODIUM: 140 mmol/L (ref 135–145)

## 2017-08-06 NOTE — Progress Notes (Signed)
[facility]                                                                              Patient Demographics  Pamela Mcguire, is a 82 y.o. female, DOB - 1935/01/31, YJE:[id number]  Admit date - 08/05/2017   Admitting Physician Benito Mccreedy, MD  Outpatient Primary MD for the patient is Golden Circle, FNP  Outpatient specialists:   LOS - 1  days    Chief Complaint  Patient presents with  . Chest Pain  . Dizziness       Brief summary  Pamela Mcguire is a 82 y.o. female with medical history significant for but not limited to A. fib, COPD on home oxygen, cancers of breast, lung and colon in remission, presenting with 5 day history of progressively worsening shortness of breath and easy fatigability associated with Afib with RVR, and new b/l pleural effusion Assessment & Plan    Principal Problem:   Atrial fibrillation with rapid ventricular response (HCC)  1 A. fib with rapid ventricular response: ChadsVasc>3 TSH- 2.049 LDL 78 A1C 6.0%  Troponin - negative 2D Echo -P On Eliqus Rate controlled with Cardizem drip, switched to oral as needed Cardiology consult   #2 bilateral pleural effusion: Etiology-possibly related to malignancy?? pneumonia-consolidation per CT  Ultrasound-guided thoracentesis Cover with antibiotics for presumptive pulmonary infection BNP -447.6>>530.2 [place] Diuresis Repeat CXR in the morning  #3 UTI: Antibiotics Check urine culture  DVT prophylaxis: Eliquis Code Status: (Full) Family Communication: Discussed in detail with the patient, all imaging results, lab results explained to the patient and son at bedside Disposition Plan: To be determined    Time Spent in minutes 35 minutes  Procedures:    Consultants:     Antimicrobials:      Medications  Scheduled Meds: . apixaban  5 mg Oral BID  . atorvastatin  40 mg Oral q1800  . buPROPion  100 mg Oral Daily  . carvedilol  3.125 mg Oral BID WC  . diltiazem   60 mg Oral Q6H  . escitalopram  10 mg Oral Daily  . fluticasone furoate-vilanterol  1 puff Inhalation Daily  . furosemide  40 mg Intravenous Daily  . mouth rinse  15 mL Mouth Rinse BID  . multivitamin with minerals  1 tablet Oral Daily   Continuous Infusions: . cefTRIAXone (ROCEPHIN)  IV Stopped (08/05/17 2152)  . diltiazem (CARDIZEM) infusion Stopped (08/05/17 2228)   PRN Meds:.acetaminophen, albuterol, LORazepam, ondansetron (ZOFRAN) IV, oxyCODONE   Antibiotics   Anti-infectives    Start     Dose/Rate Route Frequency Ordered Stop   08/05/17 2000  cefTRIAXone (ROCEPHIN) 1 g in dextrose 5 % 50 mL IVPB     1 g 100 mL/hr over 30 Minutes Intravenous Every 24 hours 08/05/17 1832          Subjective:   Pamela Mcguire was seen and examined today. Patient denies chest pain, shortness of breath is better, but feels a little dizzy without vertigo. No acute events overnight.    Objective:   Vitals:   08/06/17 0756 08/06/17 0800 08/06/17 1000 08/06/17 1200  BP:  (!) 127/59 (!) 115/48 Marland Kitchen)  108/43  Pulse:  70 62 86  Resp:  (!) 24 (!) 21 (!) 23  Temp:  98.6 F (37 C)    TempSrc:  Oral    SpO2: 94% 93% 92% 94%  Weight:      Height:        Intake/Output Summary (Last 24 hours) at 08/06/17 1258 Last data filed at 08/06/17 1200  Gross per 24 hour  Intake              590 ml  Output             1700 ml  Net            -1110 ml     Wt Readings from Last 3 Encounters:  08/06/17 83.8 kg (184 lb 11.9 oz)  07/10/17 84.1 kg (185 lb 6.4 oz)  07/06/17 82.9 kg (182 lb 11.2 oz)     Exam  General: NAD  HEENT: NCAT,  PERRL,MMM  Neck: SUPPLE, (-) JVD  Cardiovascular: IRRR, (-) GALLOP, (-) MURMUR  Respiratory: CTA  Gastrointestinal: SOFT, (-) DISTENSION, BS(+), (_) TENDERNESS  Ext: (-) CYANOSIS, (-) EDEMA  Neuro: A, O  Skin:(-) RASH  Psych:NORMAL AFFECT/MOOD   Data Reviewed:  I have personally reviewed following labs and imaging studies  Micro Results Recent  Results (from the past 240 hour(s))  MRSA PCR Screening     Status: None   Collection Time: 08/05/17  6:02 PM  Result Value Ref Range Status   MRSA by PCR NEGATIVE NEGATIVE Final    Comment:        The GeneXpert MRSA Assay (FDA approved for NASAL specimens only), is one component of a comprehensive MRSA colonization surveillance program. It is not intended to diagnose MRSA infection nor to guide or monitor treatment for MRSA infections.     Radiology Reports Dg Chest 2 View  Result Date: 08/05/2017 CLINICAL DATA:  Right chest pain. History of lung cancer breast cancer colon cancer EXAM: CHEST  2 VIEW COMPARISON:  CT chest 07/04/2017.  Chest x-ray 09/08/2016 FINDINGS: Right upper lobe density most compatible with post radiation scarring similar to the prior CT. Associated pleural thickening in the right apex also unchanged. Elevated right hemidiaphragm with right lower lobe atelectasis. Left lung clear Heart size upper normal.  Negative for heart failure or edema. IMPRESSION: Chronic scarring right upper lobe related to prior cancer treatment. No superimposed acute abnormality. Electronically Signed   By: Franchot Gallo M.D.   On: 08/05/2017 13:56   Ct Angio Chest Pe W And/or Wo Contrast  Result Date: 08/05/2017 CLINICAL DATA:  Patient with history of lung, breast and colon cancer. Chest pain. Shortness of breath. Evaluate for PE. EXAM: CT ANGIOGRAPHY CHEST WITH CONTRAST TECHNIQUE: Multidetector CT imaging of the chest was performed using the standard protocol during bolus administration of intravenous contrast. Multiplanar CT image reconstructions and MIPs were obtained to evaluate the vascular anatomy. CONTRAST:  100 cc Isovue 370 COMPARISON:  CT CAP 07/04/2017 FINDINGS: Cardiovascular: Heart is enlarged. No pericardial effusion. Coronary arterial vascular calcifications. Adequate opacification of the pulmonary arterial system. Motion artifact limits evaluation. No evidence for acute  pulmonary embolus. Mediastinum/Nodes: Right axillary surgical clips. No left axillary adenopathy. Interval development of a 1.3 cm prevascular lymph node (image 30; series 4). Lungs/Pleura: Expiratory phase of imaging. Interval development of large right and moderate left layering pleural effusions. New bilateral patchy ground-glass pulmonary opacities and interlobular septal thickening. Right mid and upper lung paramediastinal post radiation changes, largely obscured  due to large right pleural effusion. Re- demonstrated 10 mm right lower lobe pulmonary nodule (image 58; series 11). Upper Abdomen: No acute process. Musculoskeletal: Thoracic spine degenerative changes. No aggressive or acute appearing osseous lesions. Review of the MIP images confirms the above findings. IMPRESSION: 1. No evidence for acute pulmonary embolus. 2. Interval development of large right and moderate left pleural effusions resulting in subpleural consolidative opacities within the bilateral lungs. 3. New bilateral ground-glass pulmonary opacities and interlobular septal thickening most suggestive of pulmonary edema. 4. Difficult to assess/evaluate right paramediastinal post radiation changes given distortion from large right pleural effusion. 5. Aortic atherosclerosis. 6. Interval development of mildly enlarged prevascular lymph node. Recommend attention on follow-up. Electronically Signed   By: Lovey Newcomer M.D.   On: 08/05/2017 16:00    Lab Data:  CBC:  Recent Labs Lab 08/05/17 1305 08/05/17 1445  WBC 5.7  --   HGB 11.1* 11.6*  HCT 34.7* 34.0*  MCV 93.0  --   PLT 289  --    Basic Metabolic Panel:  Recent Labs Lab 08/05/17 1351 08/05/17 1445 08/05/17 1835 08/06/17 0354  NA 139 141  --  140  K 3.9 3.9  --  3.7  CL 106 103  --  100*  CO2 27  --   --  29  GLUCOSE 128* 128*  --  128*  BUN 15 15  --  15  CREATININE 0.97 0.90  --  1.01*  CALCIUM 8.6*  --   --  8.7*  MG  --   --  1.6*  --    GFR: Estimated  Creatinine Clearance: 49.4 mL/min (A) (by C-G formula based on SCr of 1.01 mg/dL (H)). Liver Function Tests:  Recent Labs Lab 08/05/17 1351  AST 15  ALT 15  ALKPHOS 47  BILITOT 0.7  PROT 6.4*  ALBUMIN 3.2*   No results for input(s): LIPASE, AMYLASE in the last 168 hours. No results for input(s): AMMONIA in the last 168 hours. Coagulation Profile:  Recent Labs Lab 08/05/17 1351  INR 1.75   Cardiac Enzymes:  Recent Labs Lab 08/06/17 0354  TROPONINI <0.03   BNP (last 3 results) No results for input(s): PROBNP in the last 8760 hours. HbA1C:  Recent Labs  08/05/17 1835  HGBA1C 6.0*   CBG: No results for input(s): GLUCAP in the last 168 hours. Lipid Profile:  Recent Labs  08/06/17 0354  CHOL 134  HDL 37*  LDLCALC 78  TRIG 94  CHOLHDL 3.6   Thyroid Function Tests:  Recent Labs  08/05/17 1835  TSH 2.049   Anemia Panel: No results for input(s): VITAMINB12, FOLATE, FERRITIN, TIBC, IRON, RETICCTPCT in the last 72 hours. Urine analysis:    Component Value Date/Time   COLORURINE YELLOW 07/10/2017 1633   APPEARANCEUR CLEAR 07/10/2017 1633   LABSPEC >=1.030 (A) 07/10/2017 1633   LABSPEC 1.010 06/02/2015 1706   PHURINE 6.0 07/10/2017 1633   GLUCOSEU NEGATIVE 07/10/2017 1633   GLUCOSEU Negative 06/02/2015 1706   HGBUR MODERATE (A) 07/10/2017 1633   BILIRUBINUR NEGATIVE 07/10/2017 1633   BILIRUBINUR 3+      4mg /dl 09/08/2016 1133   KETONESUR TRACE (A) 07/10/2017 1633   PROTEINUR 1+   30mg /dl 09/08/2016 1133   PROTEINUR NEGATIVE 09/14/2015 1950   UROBILINOGEN 0.2 07/10/2017 1633   UROBILINOGEN 0.2 06/02/2015 1706   NITRITE NEGATIVE 07/10/2017 1633   LEUKOCYTESUR TRACE (A) 07/10/2017 1633   LEUKOCYTESUR Moderate 06/02/2015 1706     OSEI-BONSU,Garlene Apperson M.D. [facility] 08/06/2017, 12:58  PM  Pager: [phone] Between 7am to 7pm - call Pager - [phone]  After 7pm go to [email or web address] - password [email or web address]  Call night coverage person covering after  7pm

## 2017-08-07 ENCOUNTER — Observation Stay (HOSPITAL_COMMUNITY): Payer: Medicare HMO

## 2017-08-07 DIAGNOSIS — J90 Pleural effusion, not elsewhere classified: Secondary | ICD-10-CM | POA: Diagnosis not present

## 2017-08-07 DIAGNOSIS — I4891 Unspecified atrial fibrillation: Secondary | ICD-10-CM

## 2017-08-07 LAB — CBC
HCT: 36.5 % (ref 36.0–46.0)
HEMOGLOBIN: 11.8 g/dL — AB (ref 12.0–15.0)
MCH: 29.3 pg (ref 26.0–34.0)
MCHC: 32.3 g/dL (ref 30.0–36.0)
MCV: 90.6 fL (ref 78.0–100.0)
Platelets: 294 10*3/uL (ref 150–400)
RBC: 4.03 MIL/uL (ref 3.87–5.11)
RDW: 15.3 % (ref 11.5–15.5)
WBC: 7.5 10*3/uL (ref 4.0–10.5)

## 2017-08-07 LAB — GRAM STAIN

## 2017-08-07 LAB — LACTATE DEHYDROGENASE, PLEURAL OR PERITONEAL FLUID: LD, Fluid: 75 U/L — ABNORMAL HIGH (ref 3–23)

## 2017-08-07 LAB — BODY FLUID CELL COUNT WITH DIFFERENTIAL
Lymphs, Fluid: 63 %
Monocyte-Macrophage-Serous Fluid: 33 % — ABNORMAL LOW (ref 50–90)
NEUTROPHIL FLUID: 4 % (ref 0–25)
WBC FLUID: 847 uL (ref 0–1000)

## 2017-08-07 LAB — BASIC METABOLIC PANEL
ANION GAP: 11 (ref 5–15)
BUN: 14 mg/dL (ref 6–20)
CO2: 28 mmol/L (ref 22–32)
Calcium: 8.6 mg/dL — ABNORMAL LOW (ref 8.9–10.3)
Chloride: 98 mmol/L — ABNORMAL LOW (ref 101–111)
Creatinine, Ser: 0.84 mg/dL (ref 0.44–1.00)
Glucose, Bld: 124 mg/dL — ABNORMAL HIGH (ref 65–99)
Potassium: 3.1 mmol/L — ABNORMAL LOW (ref 3.5–5.1)
SODIUM: 137 mmol/L (ref 135–145)

## 2017-08-07 LAB — BRAIN NATRIURETIC PEPTIDE: B NATRIURETIC PEPTIDE 5: 236.3 pg/mL — AB (ref 0.0–100.0)

## 2017-08-07 LAB — HEPATIC FUNCTION PANEL
ALK PHOS: 63 U/L (ref 38–126)
ALT: 15 U/L (ref 14–54)
AST: 17 U/L (ref 15–41)
Albumin: 3.3 g/dL — ABNORMAL LOW (ref 3.5–5.0)
BILIRUBIN DIRECT: 0.1 mg/dL (ref 0.1–0.5)
BILIRUBIN INDIRECT: 0.5 mg/dL (ref 0.3–0.9)
Total Bilirubin: 0.6 mg/dL (ref 0.3–1.2)
Total Protein: 7 g/dL (ref 6.5–8.1)

## 2017-08-07 LAB — URINALYSIS, ROUTINE W REFLEX MICROSCOPIC
Bilirubin Urine: NEGATIVE
GLUCOSE, UA: NEGATIVE mg/dL
HGB URINE DIPSTICK: NEGATIVE
KETONES UR: NEGATIVE mg/dL
NITRITE: NEGATIVE
PH: 6 (ref 5.0–8.0)
PROTEIN: NEGATIVE mg/dL
Specific Gravity, Urine: 1.012 (ref 1.005–1.030)

## 2017-08-07 LAB — LACTATE DEHYDROGENASE: LDH: 155 U/L (ref 98–192)

## 2017-08-07 LAB — MAGNESIUM: MAGNESIUM: 1.4 mg/dL — AB (ref 1.7–2.4)

## 2017-08-07 LAB — TROPONIN I

## 2017-08-07 LAB — GLUCOSE, PLEURAL OR PERITONEAL FLUID: GLUCOSE FL: 134 mg/dL

## 2017-08-07 LAB — PROTEIN, PLEURAL OR PERITONEAL FLUID: Total protein, fluid: <3 g/dL

## 2017-08-07 MED ORDER — APIXABAN 5 MG PO TABS
5.0000 mg | ORAL_TABLET | Freq: Two times a day (BID) | ORAL | Status: DC
  Administered 2017-08-08 – 2017-08-09 (×3): 5 mg via ORAL
  Filled 2017-08-07 (×3): qty 1

## 2017-08-07 MED ORDER — MAGNESIUM SULFATE 2 GM/50ML IV SOLN
2.0000 g | Freq: Once | INTRAVENOUS | Status: AC
  Administered 2017-08-07: 2 g via INTRAVENOUS
  Filled 2017-08-07: qty 50

## 2017-08-07 MED ORDER — POTASSIUM CHLORIDE CRYS ER 20 MEQ PO TBCR
40.0000 meq | EXTENDED_RELEASE_TABLET | ORAL | Status: AC
  Administered 2017-08-07 (×2): 40 meq via ORAL
  Filled 2017-08-07 (×2): qty 2

## 2017-08-07 MED ORDER — LIDOCAINE HCL 2 % IJ SOLN
INTRAMUSCULAR | Status: AC
  Filled 2017-08-07: qty 10

## 2017-08-07 NOTE — Care Management Obs Status (Signed)
[place] NOTIFICATION   Patient Details  Name: Pamela Mcguire MRN: [id number] Date of Birth: 08-29-1935   Medicare Observation Status Notification Given:  Yes    Leeroy Cha, RN 08/07/2017, 10:08 AM

## 2017-08-07 NOTE — Progress Notes (Signed)
PROGRESS NOTE    Pamela Mcguire  UJW:[id number] DOB: 30-Jun-1935 DOA: 08/05/2017 PCP: [facility], FNP     Brief Narrative:  Pamela Mcguire is a 82 yo female with past medical history of A Fib, COPD and chronic respiratory failure on home oxygen, hx breast cancer in remission; she presents with 5 day history of progressively worsening shortness of breath, easy fatigability as well as diffuse sternal, nonradiating chest pain. In the emergency department, patient was in atrial fibrillation with rapid ventricular response. She was started on IV Cardizem bolus with drip, admitted to stepdown unit. CTA chest was negative for pulmonary embolism, but revealed bilateral pleural effusions.  Assessment & Plan:   Principal Problem:   Atrial fibrillation with rapid ventricular response (HCC)  Paroxysmal A Fib with RVR -Follows with Dr. Debara Pickett  -CHADSVASc 2 -Continue eliquis, resume tomorrow  -Now off cardizem gtt > 24 hours, transitioned to oral cardizem 60mg  q6h   Bilateral pleural effusion -PCCM consulted by Dr. Vista Lawman yesterday -Right sided thoracentesis today   History of lung adenocarcinoma -Now in remission. Follows with Dr. Julien Nordmann  Chronic hypoxemic respiratory failure -Continue home O2  Hypokalemia -Replace, trend  Hypomagnesemia -Replace, trend  HLD -Continue lipitor  Depression -Continue Wellbutrin, Lexapro   DVT prophylaxis: Eliquis resume tomorrow Code Status: Full Family Communication: Daughter updated over the phone Disposition Plan: Pending further evaluation for pleural effusion, transferred to telemetry unit today   Consultants:   PCCM  Procedures:   Right thoracentesis 9/10  Antimicrobials:  Anti-infectives    Start     Dose/Rate Route Frequency Ordered Stop   08/05/17 2000  cefTRIAXone (ROCEPHIN) 1 g in dextrose 5 % 50 mL IVPB  Status:  Discontinued     1 g 100 mL/hr over 30 Minutes Intravenous Every 24 hours 08/05/17 1832 08/07/17 1328        Subjective: Patient is sitting in bed, eating breakfast. Does not have any acute complaints, doing well all things considered. Denies any worsening shortness of breath from her baseline, no chest pain this morning, denies any nausea or vomiting.  Objective: Vitals:   08/07/17 1043 08/07/17 1053 08/07/17 1110 08/07/17 1200  BP: 110/67 120/73 119/62   Pulse:      Resp:      Temp:    98.1 F (36.7 C)  TempSrc:    Oral  SpO2:      Weight:      Height:        Intake/Output Summary (Last 24 hours) at 08/07/17 1331 Last data filed at 08/07/17 1306  Gross per 24 hour  Intake           464.67 ml  Output             2100 ml  Net         -1635.33 ml   Filed Weights   08/05/17 1745 08/06/17 0515 08/07/17 0500  Weight: 85.1 kg (187 lb 9.8 oz) 83.8 kg (184 lb 11.9 oz) 83.6 kg (184 lb 4.9 oz)    Examination:  General exam: Appears calm and comfortable  Respiratory system: diminished breath sounds to auscultation. Respiratory effort normal. On [place] O2 Cardiovascular system: S1 & S2 heard, Irreg irreg rate 80s. No JVD, murmurs, rubs, gallops or clicks. No pedal edema. Gastrointestinal system: Abdomen is nondistended, soft and nontender. No organomegaly or masses felt. Normal bowel sounds heard. Central nervous system: Alert and oriented. No focal neurological deficits. Extremities: Symmetric 5 x 5 power. Skin: No rashes, lesions or  ulcers Psychiatry: Judgement and insight appear normal. Mood & affect appropriate.   Data Reviewed: I have personally reviewed following labs and imaging studies  CBC:  Recent Labs Lab 08/05/17 1305 08/05/17 1445 08/07/17 0148  WBC 5.7  --  7.5  HGB 11.1* 11.6* 11.8*  HCT 34.7* 34.0* 36.5  MCV 93.0  --  90.6  PLT 289  --  478   Basic Metabolic Panel:  Recent Labs Lab 08/05/17 1351 08/05/17 1445 08/05/17 1835 08/06/17 0354 08/07/17 0148  NA 139 141  --  140 137  K 3.9 3.9  --  3.7 3.1*  CL 106 103  --  100* 98*  CO2 27  --   --  29 28   GLUCOSE 128* 128*  --  128* 124*  BUN 15 15  --  15 14  CREATININE 0.97 0.90  --  1.01* 0.84  CALCIUM 8.6*  --   --  8.7* 8.6*  MG  --   --  1.6*  --  1.4*   GFR: Estimated Creatinine Clearance: 59.4 mL/min (by C-G formula based on SCr of 0.84 mg/dL). Liver Function Tests:  Recent Labs Lab 08/05/17 1351  AST 15  ALT 15  ALKPHOS 47  BILITOT 0.7  PROT 6.4*  ALBUMIN 3.2*   No results for input(s): LIPASE, AMYLASE in the last 168 hours. No results for input(s): AMMONIA in the last 168 hours. Coagulation Profile:  Recent Labs Lab 08/05/17 1351  INR 1.75   Cardiac Enzymes:  Recent Labs Lab 08/06/17 0354 08/06/17 1350 08/06/17 2006 08/07/17 0148  TROPONINI <0.03 <0.03 <0.03 <0.03   BNP (last 3 results) No results for input(s): PROBNP in the last 8760 hours. HbA1C:  Recent Labs  08/05/17 1835  HGBA1C 6.0*   CBG: No results for input(s): GLUCAP in the last 168 hours. Lipid Profile:  Recent Labs  08/06/17 0354  CHOL 134  HDL 37*  LDLCALC 78  TRIG 94  CHOLHDL 3.6   Thyroid Function Tests:  Recent Labs  08/05/17 1835  TSH 2.049   Anemia Panel: No results for input(s): VITAMINB12, FOLATE, FERRITIN, TIBC, IRON, RETICCTPCT in the last 72 hours. Sepsis Labs: No results for input(s): PROCALCITON, LATICACIDVEN in the last 168 hours.  Recent Results (from the past 240 hour(s))  MRSA PCR Screening     Status: None   Collection Time: 08/05/17  6:02 PM  Result Value Ref Range Status   MRSA by PCR NEGATIVE NEGATIVE Final    Comment:        The GeneXpert MRSA Assay (FDA approved for NASAL specimens only), is one component of a comprehensive MRSA colonization surveillance program. It is not intended to diagnose MRSA infection nor to guide or monitor treatment for MRSA infections.        Radiology Studies: Dg Chest 1 View  Result Date: 08/07/2017 CLINICAL DATA:  Status post right-sided thoracentesis EXAM: CHEST 1 VIEW COMPARISON:  August 07, 2017 FINDINGS: No evident pneumothorax. There is mild scarring in the right base. There are rather minimal pleural effusions present currently. There is mild scarring in the right upper lobe. No edema or consolidation. Heart is mildly enlarged with pulmonary vascularity within normal limits. No adenopathy. There is aortic atherosclerosis. There are surgical clips the right axillary region. IMPRESSION: Scarring right upper lobe and right base. Small pleural effusions bilaterally. No pneumothorax. Stable cardiac prominence. There is aortic atherosclerosis. Aortic Atherosclerosis (ICD10-I70.0). Electronically Signed   By: Lowella Grip III M.D.   On:  08/07/2017 12:07   Dg Chest 1 View  Result Date: 08/07/2017 CLINICAL DATA:  Chest pain in shortness of breath. History of hypertension, breast and lung cancer. EXAM: CHEST 1 VIEW COMPARISON:  CT chest June 04, 2017 and multiple priors. FINDINGS: The cardiac silhouette is mildly enlarged and unchanged. Calcified aortic knob. Moderate RIGHT pleural effusion, no residual LEFT pleural effusion by portable radiography. Patchy RIGHT upper lobe airspace opacity. No pneumothorax. Surgical clips project in RIGHT axilla. Osseous structures are nonsuspicious. IMPRESSION: Moderate RIGHT and no residual LEFT pleural effusions. Patchy RIGHT upper lobe airspace opacity previously attributed to postradiation change. Mild cardiomegaly and chronic interstitial changes. Electronically Signed   By: Elon Alas M.D.   On: 08/07/2017 06:50   Ct Angio Chest Pe W And/or Wo Contrast  Result Date: 08/05/2017 CLINICAL DATA:  Patient with history of lung, breast and colon cancer. Chest pain. Shortness of breath. Evaluate for PE. EXAM: CT ANGIOGRAPHY CHEST WITH CONTRAST TECHNIQUE: Multidetector CT imaging of the chest was performed using the standard protocol during bolus administration of intravenous contrast. Multiplanar CT image reconstructions and MIPs were obtained to evaluate the  vascular anatomy. CONTRAST:  100 cc Isovue 370 COMPARISON:  CT CAP 07/04/2017 FINDINGS: Cardiovascular: Heart is enlarged. No pericardial effusion. Coronary arterial vascular calcifications. Adequate opacification of the pulmonary arterial system. Motion artifact limits evaluation. No evidence for acute pulmonary embolus. Mediastinum/Nodes: Right axillary surgical clips. No left axillary adenopathy. Interval development of a 1.3 cm prevascular lymph node (image 30; series 4). Lungs/Pleura: Expiratory phase of imaging. Interval development of large right and moderate left layering pleural effusions. New bilateral patchy ground-glass pulmonary opacities and interlobular septal thickening. Right mid and upper lung paramediastinal post radiation changes, largely obscured due to large right pleural effusion. Re- demonstrated 10 mm right lower lobe pulmonary nodule (image 58; series 11). Upper Abdomen: No acute process. Musculoskeletal: Thoracic spine degenerative changes. No aggressive or acute appearing osseous lesions. Review of the MIP images confirms the above findings. IMPRESSION: 1. No evidence for acute pulmonary embolus. 2. Interval development of large right and moderate left pleural effusions resulting in subpleural consolidative opacities within the bilateral lungs. 3. New bilateral ground-glass pulmonary opacities and interlobular septal thickening most suggestive of pulmonary edema. 4. Difficult to assess/evaluate right paramediastinal post radiation changes given distortion from large right pleural effusion. 5. Aortic atherosclerosis. 6. Interval development of mildly enlarged prevascular lymph node. Recommend attention on follow-up. Electronically Signed   By: Lovey Newcomer M.D.   On: 08/05/2017 16:00      Scheduled Meds: . [START ON 08/08/2017] apixaban  5 mg Oral BID  . atorvastatin  40 mg Oral q1800  . buPROPion  100 mg Oral Daily  . diltiazem  60 mg Oral Q6H  . escitalopram  10 mg Oral Daily  .  fluticasone furoate-vilanterol  1 puff Inhalation Daily  . furosemide  40 mg Intravenous Daily  . lidocaine      . mouth rinse  15 mL Mouth Rinse BID  . multivitamin with minerals  1 tablet Oral Daily   Continuous Infusions:    LOS: 1 day    Time spent: 40 minutes   Dessa Phi, DO [facility] [email or web address] Password [email or web address] 08/07/2017, 1:31 PM

## 2017-08-07 NOTE — Consult Note (Signed)
PULMONARY / CRITICAL CARE MEDICINE   Name: Pamela Mcguire MRN: [id number] DOB: [birth year]    ADMISSION DATE:  08/05/2017 CONSULTATION DATE:  08/07/2017  REFERRING MD:  Maylene Roes  CHIEF COMPLAINT:  Dyspnea  HISTORY OF PRESENT ILLNESS:   82 y/o female with a past medical history significant for adenocarcinoma of the lung x2 (Stage IIIa diagnosed 2016, then stage II diagnosed 2018) and afib came to the [facility] ER on 9/8 with dyspnea.  She was found to have atrial fibrillation with RVR, she was admitted by the hospitalist service for further management.  She was noted to have large right>left bilateral pleural effusions.  On my exam this morning she is confused somewhat and can provide a limited history.  She tells me that the dyspnea came on slowly and was associated with a mildly productive cough.  She denied recent nausea, vomiting, fevers, or chills.  No recent chest pain.    PAST MEDICAL HISTORY :  She  has a past medical history of AF (atrial fibrillation) ([place]); Anxiety; Arthritis; Breast cancer ([place]); CAD (coronary artery disease); Dehydration (12/06/2016); Dementia; Depression; Diverticulitis; GERD (gastroesophageal reflux disease); HCAP (healthcare-associated pneumonia) (09/15/2015); Hyperlipidemia; Hypertension; Non-small cell carcinoma of lung, stage 3 (HCC) (04/30/2015); Prediabetes; Seasonal allergies; Shortness of breath dyspnea; Sleep apnea; Urinary frequency; and UTI (urinary tract infection) (06/06/2015).  PAST SURGICAL HISTORY: She  has a past surgical history that includes Cardioversion; Tonsillectomy; Colonoscopy; Cesarean section (X 2); Mastectomy (Right); Breast surgery; Mediastinoscopy (N/A, 04/28/2015); Esophagogastroduodenoscopy (N/A, 01/13/2017); Colonoscopy (N/A, 01/13/2017); Laparoscopic partial colectomy (N/A, 02/08/2017); Colon surgery; and Cataract surgery.  No Known Allergies  No current facility-administered medications on file prior to encounter.    Current Outpatient Prescriptions on  File Prior to Encounter  Medication Sig  . albuterol (PROVENTIL HFA;VENTOLIN HFA) 108 (90 Base) MCG/ACT inhaler Inhale 1 puff into the lungs every 6 (six) hours as needed for wheezing or shortness of breath. Reported on 06/01/2016  . apixaban (ELIQUIS) 5 MG TABS tablet Take 1 tablet by mouth twice daily. Appoint,ent needed for further refills - call cardiologist to schedule follow up visit-  . atorvastatin (LIPITOR) 40 MG tablet TAKE 1 TABLET(40 MG) BY MOUTH DAILY  . buPROPion (WELLBUTRIN SR) 100 MG 12 hr tablet TAKE 1 TABLET(100 MG) BY MOUTH DAILY  . CALCIUM-VITAMIN D PO Take 1 tablet by mouth daily.  . carvedilol (COREG) 3.125 MG tablet TAKE 1 TABLET(3.125 MG) BY MOUTH TWICE DAILY  . escitalopram (LEXAPRO) 10 MG tablet TAKE 1 TABLET(10 MG) BY MOUTH DAILY. FOLLOW-UP APPOINTMENT IS DUE  . fluticasone furoate-vilanterol (BREO ELLIPTA) 100-25 MCG/INH AEPB Inhale 1 puff into the lungs daily.  Marland Kitchen loratadine (CLARITIN) 10 MG tablet TAKE 1 TABLET BY MOUTH AT BEDTIME  . LORazepam (ATIVAN) 2 MG tablet TAKE 1 TABLET BY MOUTH AT BEDTIME AS NEEDED FOR ANXIETY  . Multiple Vitamins-Minerals (MULTIVITAMIN WITH MINERALS) tablet Take 1 tablet by mouth daily.  Marland Kitchen NUTRITIONAL SUPPLEMENT LIQD Take 120 mLs by mouth 3 (three) times daily. Med[employer]  . oxybutynin (DITROPAN) 5 MG tablet TAKE 1 TABLET BY MOUTH TWICE DAILY  . oxyCODONE (OXY IR/ROXICODONE) 5 MG immediate release tablet Take 0.5-1 tablets (2.5-5 mg total) by mouth every 6 (six) hours as needed for severe pain.  . pantoprazole (PROTONIX) 40 MG tablet TAKE 1 TABLET BY MOUTH EVERY DAY  . ofloxacin (OCUFLOX) 0.3 % ophthalmic solution   . OXYGEN Inhale 2 L into the lungs as needed.  . prednisoLONE acetate (PRED FORTE) 1 % ophthalmic suspension INT 1 GTT INTO SURGICAL  EYE QID AFTER SURGERY    FAMILY HISTORY:  Her indicated that her mother is deceased. She indicated that her father is deceased.    SOCIAL HISTORY: She  reports that she quit smoking about 24 years  ago. Her smoking use included Cigarettes. She has a 35.00 pack-year smoking history. She has never used smokeless tobacco. She reports that she drinks about 5.4 oz of alcohol per week . She reports that she does not use drugs.  REVIEW OF SYSTEMS:   Cannot obtain due to confusion  SUBJECTIVE:  As above  VITAL SIGNS: BP (!) 158/86   Pulse (!) 133   Temp 98.3 F (36.8 C) (Oral)   Resp (!) 23   Ht 5\' 7"  (1.702 m)   Wt 184 lb 4.9 oz (83.6 kg)   SpO2 96%   BMI 28.87 kg/m   HEMODYNAMICS:    VENTILATOR SETTINGS:    INTAKE / OUTPUT: I/O last 3 completed shifts: In: 940 [P.O.:800; I.V.:40; IV Piggyback:100] Out: 2450 [Urine:2450]  PHYSICAL EXAMINATION:  General:  Resting comfortably in bed HENT: NCAT OP clear PULM: Diminished R base, normal effort CV: Irreg irreg, no mgr GI: BS+, soft, nontender MSK: normal bulk and tone Neuro: awake, alert, no distress, MAEW   LABS:  BMET  Recent Labs Lab 08/05/17 1351 08/05/17 1445 08/06/17 0354 08/07/17 0148  NA 139 141 140 137  K 3.9 3.9 3.7 3.1*  CL 106 103 100* 98*  CO2 27  --  29 28  BUN 15 15 15 14   CREATININE 0.97 0.90 1.01* 0.84  GLUCOSE 128* 128* 128* 124*    Electrolytes  Recent Labs Lab 08/05/17 1351 08/05/17 1835 08/06/17 0354 08/07/17 0148  CALCIUM 8.6*  --  8.7* 8.6*  MG  --  1.6*  --  1.4*    CBC  Recent Labs Lab 08/05/17 1305 08/05/17 1445 08/07/17 0148  WBC 5.7  --  7.5  HGB 11.1* 11.6* 11.8*  HCT 34.7* 34.0* 36.5  PLT 289  --  294    Coag's  Recent Labs Lab 08/05/17 1351  INR 1.75    Sepsis Markers No results for input(s): LATICACIDVEN, PROCALCITON, O2SATVEN in the last 168 hours.  ABG No results for input(s): PHART, PCO2ART, PO2ART in the last 168 hours.  Liver Enzymes  Recent Labs Lab 08/05/17 1351  AST 15  ALT 15  ALKPHOS 47  BILITOT 0.7  ALBUMIN 3.2*    Cardiac Enzymes  Recent Labs Lab 08/06/17 1350 08/06/17 2006 08/07/17 0148  TROPONINI <0.03 <0.03  <0.03    Glucose No results for input(s): GLUCAP in the last 168 hours.  Imaging Dg Chest 1 View  Result Date: 08/07/2017 CLINICAL DATA:  Chest pain in shortness of breath. History of hypertension, breast and lung cancer. EXAM: CHEST 1 VIEW COMPARISON:  CT chest June 04, 2017 and multiple priors. FINDINGS: The cardiac silhouette is mildly enlarged and unchanged. Calcified aortic knob. Moderate RIGHT pleural effusion, no residual LEFT pleural effusion by portable radiography. Patchy RIGHT upper lobe airspace opacity. No pneumothorax. Surgical clips project in RIGHT axilla. Osseous structures are nonsuspicious. IMPRESSION: Moderate RIGHT and no residual LEFT pleural effusions. Patchy RIGHT upper lobe airspace opacity previously attributed to postradiation change. Mild cardiomegaly and chronic interstitial changes. Electronically Signed   By: Elon Alas M.D.   On: 08/07/2017 06:50     STUDIES:  9/8 CT angiogram chest images reviewed showing a large R pleural effusion, small left effusion  CULTURES:  ANTIBIOTICS: Ceftriaxone 9/9 >  SIGNIFICANT EVENTS:   LINES/TUBES:   DISCUSSION: 82 y/o female who has a history of breast, colon and lung (x2) cancer who presented with acute dyspnea in the setting of a large R pleural effusion and afib with RVR.  The ddx of her effusion includes a transudate from AFib with RVR and volume overload (though on exam today she is not very volume overloaded) vs an exudate from malignancy or less likely an infection.    ASSESSMENT / PLAN:  PULMONARY A: Dyspnea due to effusion Large right pleural effusion Adenocarcinoma of the lung P:   Agree with thoracentesis today, send for protein, LDH, glucose, cell count, cytology, culture Will f/u post thora Continue diuresis  CARDIOVASCULAR A:  Afib with RVR P:  Tele cardizem   Roselie Awkward, MD [place] [facility] Pager: [phone] Cell: [phone] After 3pm or if no response, call  [phone]   08/07/2017, 9:50 AM

## 2017-08-07 NOTE — Care Management CC44 (Signed)
Condition Code 44 Documentation Completed  Patient Details  Name: Pamela Mcguire MRN: [id number] Date of Birth: 1934/12/28   Condition Code 44 given:  Yes Patient signature on Condition Code 44 notice:  Yes Documentation of 2 MD's agreement:  Yes Code 44 added to claim:  Yes    Leeroy Cha, RN 08/07/2017, 10:09 AM

## 2017-08-07 NOTE — Procedures (Signed)
Ultrasound-guided diagnostic and therapeutic right thoracentesis performed yielding 1.3 liters of slightly hazy, yellow  fluid. No immediate complications. Follow-up chest x-ray pending. The fluid was sent to the lab for preordered studies.

## 2017-08-08 ENCOUNTER — Observation Stay (HOSPITAL_BASED_OUTPATIENT_CLINIC_OR_DEPARTMENT_OTHER): Payer: Medicare HMO

## 2017-08-08 DIAGNOSIS — I340 Nonrheumatic mitral (valve) insufficiency: Secondary | ICD-10-CM

## 2017-08-08 DIAGNOSIS — J90 Pleural effusion, not elsewhere classified: Secondary | ICD-10-CM | POA: Diagnosis not present

## 2017-08-08 DIAGNOSIS — I4891 Unspecified atrial fibrillation: Secondary | ICD-10-CM | POA: Diagnosis not present

## 2017-08-08 LAB — ECHOCARDIOGRAM COMPLETE
CHL CUP LVOT MV VTI INDEX: 0.58 cm2/m2
FS: 35 % (ref 28–44)
HEIGHTINCHES: 67 in
IVS/LV PW RATIO, ED: 0.87
LA ID, A-P, ES: 40.3 mm
LADIAMINDEX: 2 cm/m2
LDCA: 2.86 cm2
LEFT ATRIUM END SYS DIAM: 40.3 mm
LVOT MV VTI: 1.17
LVOT SV: 41.7 mL
LVOT VTI: 14.6 cm
LVOT diameter: 19.1 mm
MV Annulus VTI: 35.6 cm
MV M vel: 91.9
Mean grad: 5 mmHg
PW: 9.64 mm — AB (ref 0.6–1.1)
WEIGHTICAEL: 2970.04 [oz_av]

## 2017-08-08 LAB — CBC WITH DIFFERENTIAL/PLATELET
BASOS ABS: 0 10*3/uL (ref 0.0–0.1)
BASOS PCT: 0 %
Eosinophils Absolute: 0.1 10*3/uL (ref 0.0–0.7)
Eosinophils Relative: 1 %
HEMATOCRIT: 40.4 % (ref 36.0–46.0)
Hemoglobin: 12.8 g/dL (ref 12.0–15.0)
Lymphocytes Relative: 11 %
Lymphs Abs: 0.8 10*3/uL (ref 0.7–4.0)
MCH: 29.6 pg (ref 26.0–34.0)
MCHC: 31.7 g/dL (ref 30.0–36.0)
MCV: 93.5 fL (ref 78.0–100.0)
MONO ABS: 0.6 10*3/uL (ref 0.1–1.0)
Monocytes Relative: 8 %
NEUTROS ABS: 6.4 10*3/uL (ref 1.7–7.7)
NEUTROS PCT: 80 %
Platelets: 302 10*3/uL (ref 150–400)
RBC: 4.32 MIL/uL (ref 3.87–5.11)
RDW: 15.4 % (ref 11.5–15.5)
WBC: 7.9 10*3/uL (ref 4.0–10.5)

## 2017-08-08 LAB — BASIC METABOLIC PANEL
ANION GAP: 11 (ref 5–15)
BUN: 12 mg/dL (ref 6–20)
CALCIUM: 8.9 mg/dL (ref 8.9–10.3)
CO2: 27 mmol/L (ref 22–32)
Chloride: 100 mmol/L — ABNORMAL LOW (ref 101–111)
Creatinine, Ser: 0.92 mg/dL (ref 0.44–1.00)
GFR calc non Af Amer: 57 mL/min — ABNORMAL LOW (ref >60)
Glucose, Bld: 129 mg/dL — ABNORMAL HIGH (ref 65–99)
Potassium: 4 mmol/L (ref 3.5–5.1)
Sodium: 138 mmol/L (ref 135–145)

## 2017-08-08 LAB — MAGNESIUM: MAGNESIUM: 1.8 mg/dL (ref 1.7–2.4)

## 2017-08-08 NOTE — Progress Notes (Signed)
  Echocardiogram 2D Echocardiogram has been performed.  Pamela Mcguire 08/08/2017, 3:19 PM

## 2017-08-08 NOTE — Clinical Social Work Note (Signed)
Clinical Social Work Assessment  Patient Details  Name: Pamela Mcguire MRN: [id number] Date of Birth: 10/22/1935  Date of referral:  08/08/17               Reason for consult:   (pt admitted from facility)                Permission sought to share information with:  Facility Sport and exercise psychologist, Family Supports Permission granted to share information::  Yes, Verbal Permission Granted  Name::     daughter [name] [phone]  Agency::  [facility]  Relationship::     Contact Information:     Housing/Transportation Living arrangements for the past 2 months:  Independence of Information:  Patient, Facility Patient Interpreter Needed:    Criminal Activity/Legal Involvement Pertinent to Current Situation/Hospitalization:  No - Comment as needed Significant Relationships:  Adult Children, Warehouse manager, Other Family Members Lives with:  Self Do you feel safe going back to the place where you live?  Yes Need for family participation in patient care:  No (Coment)  Care giving concerns:  Pt from independent living community at [facility]. Ambulates independently with use of walker at baseline. No care giving concerns reported.    Social Worker assessment / plan:  CSW was consulted as pt reported being from facility- [facility]. CSW clarified with pt/facility that she lives in independent living apartment rather than assisted living.  Nonetheless, pt reported she is pleased with her living situation, moved in there about one month ago, was living with her daughter formerly.  CSW noted that PT evaluation has been ordered for pt and will follow to assist if pt needs placement for care needs.   Employment status:  Retired Nurse, adult PT Recommendations:  Not assessed at this time (pending) Information / Referral to community resources:     Patient/Family's Response to care:  Pt appreciative of  care  Patient/Family's Understanding of and Emotional Response to Diagnosis, Current Treatment, and Prognosis:  Pt demonstrates adequate understanding of plan- was emotionally appropriate and engaged. States, "I have been doing well there and have updated my daughter on everything. I'm planning to go back home."  Emotional Assessment Appearance:  Appears stated age Attitude/Demeanor/Rapport:   (pleasant) Affect (typically observed):  Accepting, Calm, Adaptable Orientation:  Oriented to Self, Oriented to Place, Oriented to  Time, Oriented to Situation Alcohol / Substance use:  Not Applicable Psych involvement (Current and /or in the community):  No (Comment)  Discharge Needs  Concerns to be addressed:  No discharge needs identified Readmission within the last 30 days:  No Current discharge risk:  None Barriers to Discharge:  Continued Medical Work up   [employer], LCSW 08/08/2017, 2:01 PM [phone]

## 2017-08-08 NOTE — Progress Notes (Signed)
PULMONARY / CRITICAL CARE MEDICINE   Name: Pamela Mcguire MRN: [id number] DOB: Aug 07, 1935    ADMISSION DATE:  08/05/2017 CONSULTATION DATE:  08/07/2017  REFERRING MD:  Maylene Roes  CHIEF COMPLAINT:  Dyspnea  BRIEF SUMMARY:   82 y/o female with a past medical history significant for adenocarcinoma of the lung x2 (Stage IIIa diagnosed 2016, then stage II diagnosed 2018) and afib came to the [facility] ER on 9/8 with dyspnea.  She was found to have atrial fibrillation with RVR, she was admitted by the hospitalist service for further management.  She was noted to have large right>left bilateral pleural effusions.     SUBJECTIVE:  Pt reports breathing is much better since admit.  Denies SOB, chest pain. She denies infectious symptoms prior to admit.   VITAL SIGNS: BP (!) 117/50 (BP Location: Left Arm)   Pulse 73   Temp 97.7 F (36.5 C) (Oral)   Resp 15   Ht 5\' 7"  (1.702 m)   Wt 185 lb 10 oz (84.2 kg)   SpO2 98%   BMI 29.07 kg/m   HEMODYNAMICS:    VENTILATOR SETTINGS:    INTAKE / OUTPUT: I/O last 3 completed shifts: In: 584.7 [P.O.:480; I.V.:4.7; IV Piggyback:100] Out: 1700 [Urine:1700]  PHYSICAL EXAMINATION: General: elderly female in NAD, lying in bed HEENT: MM pink/moist, no jvd, dentures PSY: calm/appropriate  Neuro: AAOx4, speech clear, MAE  CV: s1s2 rrr, no m/r/g PULM: even/non-labored, posterior basilar crackles R>L HE:RDEY, non-tender, bsx4 active  Extremities: warm/dry, no edema  Skin: no rashes or lesions   LABS:  BMET  Recent Labs Lab 08/06/17 0354 08/07/17 0148 08/08/17 0406  NA 140 137 138  K 3.7 3.1* 4.0  CL 100* 98* 100*  CO2 29 28 27   BUN 15 14 12   CREATININE 1.01* 0.84 0.92  GLUCOSE 128* 124* 129*    Electrolytes  Recent Labs Lab 08/05/17 1835 08/06/17 0354 08/07/17 0148 08/08/17 0406  CALCIUM  --  8.7* 8.6* 8.9  MG 1.6*  --  1.4* 1.8    CBC  Recent Labs Lab 08/05/17 1305 08/05/17 1445 08/07/17 0148 08/08/17 0406  WBC 5.7  --  7.5 7.9   HGB 11.1* 11.6* 11.8* 12.8  HCT 34.7* 34.0* 36.5 40.4  PLT 289  --  294 302    Coag's  Recent Labs Lab 08/05/17 1351  INR 1.75    Sepsis Markers No results for input(s): LATICACIDVEN, PROCALCITON, O2SATVEN in the last 168 hours.  ABG No results for input(s): PHART, PCO2ART, PO2ART in the last 168 hours.  Liver Enzymes  Recent Labs Lab 08/05/17 1351 08/07/17 1343  AST 15 17  ALT 15 15  ALKPHOS 47 63  BILITOT 0.7 0.6  ALBUMIN 3.2* 3.3*    Cardiac Enzymes  Recent Labs Lab 08/06/17 1350 08/06/17 2006 08/07/17 0148  TROPONINI <0.03 <0.03 <0.03    Glucose No results for input(s): GLUCAP in the last 168 hours.  Imaging Dg Chest 1 View  Result Date: 08/07/2017 CLINICAL DATA:  Status post right-sided thoracentesis EXAM: CHEST 1 VIEW COMPARISON:  August 07, 2017 FINDINGS: No evident pneumothorax. There is mild scarring in the right base. There are rather minimal pleural effusions present currently. There is mild scarring in the right upper lobe. No edema or consolidation. Heart is mildly enlarged with pulmonary vascularity within normal limits. No adenopathy. There is aortic atherosclerosis. There are surgical clips the right axillary region. IMPRESSION: Scarring right upper lobe and right base. Small pleural effusions bilaterally. No pneumothorax. Stable cardiac prominence.  There is aortic atherosclerosis. Aortic Atherosclerosis (ICD10-I70.0). Electronically Signed   By: Lowella Grip III M.D.   On: 08/07/2017 12:07     STUDIES:  9/8 CT angiogram chest images reviewed showing a large R pleural effusion, small left effusion  CULTURES: Pleural 9/10 >>   ANTIBIOTICS: Ceftriaxone 9/9 >   SIGNIFICANT EVENTS: 9/08  Admit with SOB 9/10  Thora (IR) >> transudative by Light's (pleural LDH 75, Protein <3), WBC 847, 63 lymps  LINES/TUBES:   DISCUSSION: 82 y/o female who has a history of breast, colon and lung (x2) cancer who presented with acute dyspnea in  the setting of a large R pleural effusion and afib with RVR.  The ddx of her effusion includes a transudate from AFib with RVR and volume overload (though on exam today she is not very volume overloaded) vs an exudate from malignancy or less likely an infection.    ASSESSMENT / PLAN:  PULMONARY A: Dyspnea due to effusion Large right pleural effusion - transudative by Light's criteria / likely related to AF. No subjective hx of infectious process Adenocarcinoma of the lung P:   Continue diuresis as renal function / BP permit  Await cytology, pleural cultures Rate control  Intermittent CXR   CARDIOVASCULAR A:  Afib with RVR P:  Per primary  Tele monitoring Cardizem   Noe Gens, NP-C [facility] Pgr: [phone] or if no answer [phone] 08/08/2017, 11:38 AM

## 2017-08-08 NOTE — Progress Notes (Signed)
PROGRESS NOTE    Pamela Mcguire  LKG:[id number] DOB: [birth year] DOA: 08/05/2017 PCP: Golden Circle, FNP     Brief Narrative:  Pamela Mcguire is a 82 yo female with past medical history of A Fib, COPD and chronic respiratory failure on home oxygen, hx breast cancer in remission; she presents with 5 day history of progressively worsening shortness of breath, easy fatigability as well as diffuse sternal, nonradiating chest pain. In the emergency department, patient was in atrial fibrillation with rapid ventricular response. She was started on IV Cardizem bolus with drip, admitted to stepdown unit. CTA chest was negative for pulmonary embolism, but revealed bilateral pleural effusions.  Assessment & Plan:   Principal Problem:   Atrial fibrillation with rapid ventricular response (HCC)  Paroxysmal A Fib with RVR -Follows with Dr. Debara Pickett  -CHADSVASc 2 -Continue eliquis  -Now off cardizem gtt, transitioned to oral cardizem 60mg  q6h  -Rate controlled today   Bilateral pleural effusion -PCCM consulted by Dr. Vista Lawman  -Right sided thoracentesis 9/10, fluid studies are transudative. Cytology and pH pending.  -Check echo   -Lasix 40mg  IV daily   History of lung adenocarcinoma -Now in remission. Follows with Dr. Julien Nordmann  Chronic hypoxemic respiratory failure -Continue home O2  HLD -Continue lipitor  Depression -Continue Wellbutrin, Lexapro   DVT prophylaxis: Eliquis  Code Status: Full Family Communication: Daughter updated over the phone Disposition Plan: Pending echo    Consultants:   PCCM  Procedures:   Right thoracentesis 9/10  Antimicrobials:  Anti-infectives    Start     Dose/Rate Route Frequency Ordered Stop   08/05/17 2000  cefTRIAXone (ROCEPHIN) 1 g in dextrose 5 % 50 mL IVPB  Status:  Discontinued     1 g 100 mL/hr over 30 Minutes Intravenous Every 24 hours 08/05/17 1832 08/07/17 1328       Subjective: No acute complaints today. Breathing improved, no  chest pain.  Objective: Vitals:   08/08/17 0500 08/08/17 0736 08/08/17 0814 08/08/17 1100  BP:  (!) 104/57  (!) 117/50  Pulse:  73  73  Resp:  18  15  Temp:  97.7 F (36.5 C)    TempSrc:  Oral    SpO2:  96% 96% 98%  Weight: 84.2 kg (185 lb 10 oz)     Height:        Intake/Output Summary (Last 24 hours) at 08/08/17 1230 Last data filed at 08/08/17 1120  Gross per 24 hour  Intake              650 ml  Output             1775 ml  Net            -1125 ml   Filed Weights   08/06/17 0515 08/07/17 0500 08/08/17 0500  Weight: 83.8 kg (184 lb 11.9 oz) 83.6 kg (184 lb 4.9 oz) 84.2 kg (185 lb 10 oz)    Examination:  General exam: Appears calm and comfortable  Respiratory system: CTAB. Respiratory effort normal. On [place] O2 Cardiovascular system: S1 & S2 heard, Irreg irreg rate 70s. No JVD, murmurs, rubs, gallops or clicks. No pedal edema. Gastrointestinal system: Abdomen is nondistended, soft and nontender. No organomegaly or masses felt. Normal bowel sounds heard. Central nervous system: Alert and oriented. No focal neurological deficits. Extremities: Symmetric 5 x 5 power. Skin: No rashes, lesions or ulcers Psychiatry: Judgement and insight appear normal. Mood & affect appropriate.   Data Reviewed: I have personally reviewed following  labs and imaging studies  CBC:  Recent Labs Lab 08/05/17 1305 08/05/17 1445 08/07/17 0148 08/08/17 0406  WBC 5.7  --  7.5 7.9  NEUTROABS  --   --   --  6.4  HGB 11.1* 11.6* 11.8* 12.8  HCT 34.7* 34.0* 36.5 40.4  MCV 93.0  --  90.6 93.5  PLT 289  --  294 924   Basic Metabolic Panel:  Recent Labs Lab 08/05/17 1351 08/05/17 1445 08/05/17 1835 08/06/17 0354 08/07/17 0148 08/08/17 0406  NA 139 141  --  140 137 138  K 3.9 3.9  --  3.7 3.1* 4.0  CL 106 103  --  100* 98* 100*  CO2 27  --   --  29 28 27   GLUCOSE 128* 128*  --  128* 124* 129*  BUN 15 15  --  15 14 12   CREATININE 0.97 0.90  --  1.01* 0.84 0.92  CALCIUM 8.6*  --   --  8.7*  8.6* 8.9  MG  --   --  1.6*  --  1.4* 1.8   GFR: Estimated Creatinine Clearance: 54.4 mL/min (by C-G formula based on SCr of 0.92 mg/dL). Liver Function Tests:  Recent Labs Lab 08/05/17 1351 08/07/17 1343  AST 15 17  ALT 15 15  ALKPHOS 47 63  BILITOT 0.7 0.6  PROT 6.4* 7.0  ALBUMIN 3.2* 3.3*   No results for input(s): LIPASE, AMYLASE in the last 168 hours. No results for input(s): AMMONIA in the last 168 hours. Coagulation Profile:  Recent Labs Lab 08/05/17 1351  INR 1.75   Cardiac Enzymes:  Recent Labs Lab 08/06/17 0354 08/06/17 1350 08/06/17 2006 08/07/17 0148  TROPONINI <0.03 <0.03 <0.03 <0.03   BNP (last 3 results) No results for input(s): PROBNP in the last 8760 hours. HbA1C:  Recent Labs  08/05/17 1835  HGBA1C 6.0*   CBG: No results for input(s): GLUCAP in the last 168 hours. Lipid Profile:  Recent Labs  08/06/17 0354  CHOL 134  HDL 37*  LDLCALC 78  TRIG 94  CHOLHDL 3.6   Thyroid Function Tests:  Recent Labs  08/05/17 1835  TSH 2.049   Anemia Panel: No results for input(s): VITAMINB12, FOLATE, FERRITIN, TIBC, IRON, RETICCTPCT in the last 72 hours. Sepsis Labs: No results for input(s): PROCALCITON, LATICACIDVEN in the last 168 hours.  Recent Results (from the past 240 hour(s))  MRSA PCR Screening     Status: None   Collection Time: 08/05/17  6:02 PM  Result Value Ref Range Status   MRSA by PCR NEGATIVE NEGATIVE Final    Comment:        The GeneXpert MRSA Assay (FDA approved for NASAL specimens only), is one component of a comprehensive MRSA colonization surveillance program. It is not intended to diagnose MRSA infection nor to guide or monitor treatment for MRSA infections.   Culture, body fluid-bottle     Status: None (Preliminary result)   Collection Time: 08/07/17 10:51 AM  Result Value Ref Range Status   Specimen Description PLEURAL RIGHT  Final   Special Requests NONE  Final   Culture   Final    NO GROWTH < 24  HOURS Performed at [facility], [place] [address], [place], [place] [address]    Report Status PENDING  Incomplete  Gram stain     Status: None   Collection Time: 08/07/17 10:51 AM  Result Value Ref Range Status   Specimen Description PLEURAL RIGHT  Final   Special  Requests NONE  Final   Gram Stain   Final    ABUNDANT WBC PRESENT,BOTH PMN AND MONONUCLEAR NO ORGANISMS SEEN Performed at [facility], [place] [address] [place], [place] [address]    Report Status 08/07/2017 FINAL  Final       Radiology Studies: Dg Chest 1 View  Result Date: 08/07/2017 CLINICAL DATA:  Status post right-sided thoracentesis EXAM: CHEST 1 VIEW COMPARISON:  August 07, 2017 FINDINGS: No evident pneumothorax. There is mild scarring in the right base. There are rather minimal pleural effusions present currently. There is mild scarring in the right upper lobe. No edema or consolidation. Heart is mildly enlarged with pulmonary vascularity within normal limits. No adenopathy. There is aortic atherosclerosis. There are surgical clips the right axillary region. IMPRESSION: Scarring right upper lobe and right base. Small pleural effusions bilaterally. No pneumothorax. Stable cardiac prominence. There is aortic atherosclerosis. Aortic Atherosclerosis (ICD10-I70.0). Electronically Signed   By: Lowella Grip III M.D.   On: 08/07/2017 12:07   Dg Chest 1 View  Result Date: 08/07/2017 CLINICAL DATA:  Chest pain in shortness of breath. History of hypertension, breast and lung cancer. EXAM: CHEST 1 VIEW COMPARISON:  CT chest June 04, 2017 and multiple priors. FINDINGS: The cardiac silhouette is mildly enlarged and unchanged. Calcified aortic knob. Moderate RIGHT pleural effusion, no residual LEFT pleural effusion by portable radiography. Patchy RIGHT upper lobe airspace opacity. No pneumothorax. Surgical clips project in RIGHT axilla. Osseous structures are nonsuspicious. IMPRESSION: Moderate RIGHT and no residual LEFT  pleural effusions. Patchy RIGHT upper lobe airspace opacity previously attributed to postradiation change. Mild cardiomegaly and chronic interstitial changes. Electronically Signed   By: Elon Alas M.D.   On: 08/07/2017 06:50   US Thoracentesis Asp Pleural Space W/img Guide  Result Date: 08/07/2017 INDICATION: Patient with history of breast, colon and lung cancers. Bilateral pleural effusions ,right greater than left. Request made for diagnostic and therapeutic right thoracentesis. EXAM: ULTRASOUND GUIDED DIAGNOSTIC AND THERAPEUTIC RIGHT THORACENTESIS MEDICATIONS: None. COMPLICATIONS: None immediate. PROCEDURE: An ultrasound guided thoracentesis was thoroughly discussed with the patient and questions answered. The benefits, risks, alternatives and complications were also discussed. The patient understands and wishes to proceed with the procedure. Written consent was obtained. Ultrasound was performed to localize and mark an adequate pocket of fluid in the right chest. The area was then prepped and draped in the normal sterile fashion. 1% Lidocaine was used for local anesthesia. Under ultrasound guidance a Safe-T-Centesis catheter was introduced. Thoracentesis was performed. The catheter was removed and a dressing applied. FINDINGS: A total of approximately 1.3 liters of slightly hazy, yellow fluid was removed. Samples were sent to the laboratory as requested by the clinical team. IMPRESSION: Successful ultrasound guided diagnostic and therapeutic right thoracentesis yielding 1.3 liters of pleural fluid. Read by: Rowe Robert, PA-C Electronically Signed   By: Aletta Edouard M.D.   On: 08/07/2017 13:10      Scheduled Meds: . apixaban  5 mg Oral BID  . atorvastatin  40 mg Oral q1800  . buPROPion  100 mg Oral Daily  . diltiazem  60 mg Oral Q6H  . escitalopram  10 mg Oral Daily  . fluticasone furoate-vilanterol  1 puff Inhalation Daily  . furosemide  40 mg Intravenous Daily  . mouth rinse  15 mL  Mouth Rinse BID  . multivitamin with minerals  1 tablet Oral Daily   Continuous Infusions:    LOS: 1 day    Time spent: 30 minutes  Dessa Phi, DO [facility] [email or web address] Password [email or web address] 08/08/2017, 12:30 PM

## 2017-08-09 ENCOUNTER — Observation Stay (HOSPITAL_COMMUNITY): Payer: Medicare HMO

## 2017-08-09 DIAGNOSIS — J9611 Chronic respiratory failure with hypoxia: Secondary | ICD-10-CM | POA: Diagnosis not present

## 2017-08-09 DIAGNOSIS — R0789 Other chest pain: Secondary | ICD-10-CM | POA: Diagnosis not present

## 2017-08-09 DIAGNOSIS — J90 Pleural effusion, not elsewhere classified: Secondary | ICD-10-CM | POA: Diagnosis not present

## 2017-08-09 DIAGNOSIS — I480 Paroxysmal atrial fibrillation: Secondary | ICD-10-CM | POA: Diagnosis not present

## 2017-08-09 DIAGNOSIS — I4891 Unspecified atrial fibrillation: Secondary | ICD-10-CM | POA: Diagnosis not present

## 2017-08-09 LAB — PH, BODY FLUID: pH, Body Fluid: 7.6

## 2017-08-09 LAB — CBC WITH DIFFERENTIAL/PLATELET
BASOS ABS: 0 10*3/uL (ref 0.0–0.1)
BASOS PCT: 0 %
Eosinophils Absolute: 0.2 10*3/uL (ref 0.0–0.7)
Eosinophils Relative: 2 %
HEMATOCRIT: 40.6 % (ref 36.0–46.0)
HEMOGLOBIN: 13.2 g/dL (ref 12.0–15.0)
Lymphocytes Relative: 14 %
Lymphs Abs: 0.9 10*3/uL (ref 0.7–4.0)
MCH: 29.7 pg (ref 26.0–34.0)
MCHC: 32.5 g/dL (ref 30.0–36.0)
MCV: 91.4 fL (ref 78.0–100.0)
Monocytes Absolute: 0.7 10*3/uL (ref 0.1–1.0)
Monocytes Relative: 10 %
NEUTROS ABS: 5.1 10*3/uL (ref 1.7–7.7)
NEUTROS PCT: 74 %
Platelets: 302 10*3/uL (ref 150–400)
RBC: 4.44 MIL/uL (ref 3.87–5.11)
RDW: 15.1 % (ref 11.5–15.5)
WBC: 6.9 10*3/uL (ref 4.0–10.5)

## 2017-08-09 LAB — BASIC METABOLIC PANEL
ANION GAP: 11 (ref 5–15)
BUN: 12 mg/dL (ref 6–20)
CALCIUM: 9 mg/dL (ref 8.9–10.3)
CO2: 29 mmol/L (ref 22–32)
Chloride: 95 mmol/L — ABNORMAL LOW (ref 101–111)
Creatinine, Ser: 0.78 mg/dL (ref 0.44–1.00)
GFR calc non Af Amer: >60 mL/min (ref >60)
Glucose, Bld: 136 mg/dL — ABNORMAL HIGH (ref 65–99)
POTASSIUM: 3.8 mmol/L (ref 3.5–5.1)
Sodium: 135 mmol/L (ref 135–145)

## 2017-08-09 LAB — MAGNESIUM: Magnesium: 1.8 mg/dL (ref 1.7–2.4)

## 2017-08-09 LAB — TROPONIN I

## 2017-08-09 MED ORDER — DILTIAZEM HCL ER COATED BEADS 240 MG PO CP24: 240.0000 mg | ORAL_CAPSULE | Freq: Every day | ORAL | 0 refills | Status: DC

## 2017-08-09 MED ORDER — FUROSEMIDE 20 MG PO TABS: 20.0000 mg | ORAL_TABLET | Freq: Every day | ORAL | 0 refills | Status: DC

## 2017-08-09 MED ORDER — FUROSEMIDE 20 MG PO TABS
20.0000 mg | ORAL_TABLET | Freq: Every day | ORAL | Status: DC
  Administered 2017-08-09: 20 mg via ORAL
  Filled 2017-08-09: qty 1

## 2017-08-09 MED ORDER — DILTIAZEM HCL ER COATED BEADS 240 MG PO CP24
240.0000 mg | ORAL_CAPSULE | Freq: Every day | ORAL | Status: DC
  Administered 2017-08-09: 240 mg via ORAL
  Filled 2017-08-09: qty 1

## 2017-08-09 NOTE — Plan of Care (Signed)
Problem: Skin Integrity: Goal: Risk for impaired skin integrity will decrease Outcome: Completed/Met Date Met: 08/09/17 .  Problem: Tissue Perfusion: Goal: Risk factors for ineffective tissue perfusion will decrease Outcome: Progressing .  Problem: Nutrition: Goal: Adequate nutrition will be maintained Outcome: Completed/Met Date Met: 08/09/17 .  Problem: Bowel/Gastric: Goal: Will not experience complications related to bowel motility Outcome: Progressing .

## 2017-08-09 NOTE — Evaluation (Signed)
Occupational Therapy Evaluation Patient Details Name: Pamela Mcguire MRN: [id number] DOB: 05-May-1935 Today's Date: 08/09/2017    History of Present Illness 82 yo female admitted with A fib, UTI, bil pleural effusion. hx of A fib, L1 comp fx, COPD, breast cancer, lung cancer, colon cancer, dementia, colectomy 01/2017   Clinical Impression   Pt was admitted for the above. She was living in independent living at [facility] prior to admission. She uses R wheel walker and was mod I with adls.  She reports daughters came and stayed in apt when she showered. Pt needs min A overall for adls and mobility. She will benefit from assistance for these activities. Pt did get dizzy during session and had to sit.  RN aware.  Goals in acute setting are for supervision level    Follow Up Recommendations  Home health OT (supervision for mobility VS SNF; pt wants home)    Equipment Recommendations  None recommended by OT    Recommendations for Other Services       Precautions / Restrictions Precautions Precautions: Fall Precaution Comments: monitor O2 sats and BP Restrictions Weight Bearing Restrictions: No      Mobility Bed Mobility Overal bed mobility: Needs Assistance Bed Mobility: Supine to Sit;Sit to Supine     Supine to sit: Supervision;HOB elevated Sit to supine: Supervision;HOB elevated   General bed mobility comments: for safety, lines  Transfers Overall transfer level: Needs assistance Equipment used: 4-wheeled walker Transfers: Sit to/from Stand Sit to Stand: Min assist;Min guard         General transfer comment: min A from high bed and min guard from commode with grab bars    Balance Overall balance assessment: Needs assistance         Standing balance support: Bilateral upper extremity supported Standing balance-Leahy Scale: Poor Standing balance comment: requires RW                           ADL either performed or assessed with clinical judgement    ADL Overall ADL's : Needs assistance/impaired Eating/Feeding: Independent   Grooming: Wash/dry hands;Supervision/safety;Standing   Upper Body Bathing: Set up;Sitting   Lower Body Bathing: Minimal assistance;Sit to/from stand   Upper Body Dressing : Set up;Sitting   Lower Body Dressing: Minimal assistance;Sit to/from stand   Toilet Transfer: Minimal assistance;Min guard;Ambulation;Comfort height toilet;Grab bars;RW   Toileting- Water quality scientist and Hygiene: Min guard;Sit to/from stand         General ADL Comments: pt needed min A to stand from bed and min guard from commode with grab bar.  Pt walked to bathroom twice then requested to walk in hallway again; had previous walked with PT. Pt c/o dizziness.  sat on seat of 4 wheel walker. BP 139/49 and sats 98% on 2 liters.     Vision         Perception     Praxis      Pertinent Vitals/Pain Pain Assessment: No/denies pain     Hand Dominance     Extremity/Trunk Assessment Upper Extremity Assessment Upper Extremity Assessment: Overall WFL for tasks assessed      Cervical / Trunk Assessment Cervical / Trunk Assessment: Normal   Communication Communication Communication: HOH   Cognition Arousal/Alertness: Awake/alert Behavior During Therapy: WFL for tasks assessed/performed Overall Cognitive Status: History of cognitive impairments - at baseline  General Comments       Exercises     Shoulder Instructions      Home Living Family/patient expects to be discharged to:: Private residence Living Arrangements: Children Available Help at Discharge: Family;Available PRN/intermittently Type of Home: Independent living facility Home Access: Level entry     Home Layout: One level     Bathroom Shower/Tub: Tub/shower unit (grab bars)   Bathroom Toilet: Handicapped height     Home Equipment: Walker - 4 wheels;Grab bars - tub/shower;Grab bars - toilet           Prior Functioning/Environment Level of Independence: Needs assistance  Gait / Transfers Assistance Needed: uses rollator. walks to dining room ADL's / Homemaking Assistance Needed: daughters assist/supervise as needed   Comments: uses RW, goes between 2 daughters' homes, is alone a few hours per day, sit to stand was challenging PTA per daughter, no falls in past 1 year        OT Problem List: Decreased strength;Decreased activity tolerance;Impaired balance (sitting and/or standing);Cardiopulmonary status limiting activity      OT Treatment/Interventions: Self-care/ADL training;DME and/or AE instruction;Patient/family education;Balance training;Therapeutic activities    OT Goals(Current goals can be found in the care plan section) Acute Rehab OT Goals Patient Stated Goal: home OT Goal Formulation: With patient Time For Goal Achievement: 08/16/17 Potential to Achieve Goals: Good ADL Goals Pt Will Transfer to Toilet: with supervision;bedside commode;ambulating Pt Will Perform Toileting - Clothing Manipulation and hygiene: with supervision;sit to/from stand Additional ADL Goal #1: pt will perform adl with supervision/set up sit to stand  OT Frequency: Min 3X/week   Barriers to D/C:            Co-evaluation              AM-PAC PT "6 Clicks" Daily Activity     Outcome Measure Help from another person eating meals?: None Help from another person taking care of personal grooming?: A Little Help from another person toileting, which includes using toliet, bedpan, or urinal?: A Little Help from another person bathing (including washing, rinsing, drying)?: A Little Help from another person to put on and taking off regular upper body clothing?: A Little Help from another person to put on and taking off regular lower body clothing?: A Little 6 Click Score: 19   End of Session    Activity Tolerance:  (dizzy during OT) Patient left: in bed;with call bell/phone within  reach;with bed alarm set;with nursing/sitter in room  OT Visit Diagnosis: Unsteadiness on feet (R26.81);Muscle weakness (generalized) (M62.81)                Time: 0998-3382 OT Time Calculation (min): 37 min Charges:  OT General Charges $OT Visit: 1 Visit OT Evaluation $OT Eval Low Complexity: 1 Low OT Treatments $Self Care/Home Management : 8-22 mins G-Codes: OT G-codes **NOT FOR INPATIENT CLASS** Functional Assessment Tool Used: Clinical judgement Functional Limitation: Self care Self Care Current Status (N0539): At least 20 percent but less than 40 percent impaired, limited or restricted Self Care Goal Status (J6734): At least 1 percent but less than 20 percent impaired, limited or restricted   Lesle Chris, OTR/L [phone] 08/09/2017  Kennisha Qin 08/09/2017, 12:33 PM

## 2017-08-09 NOTE — Progress Notes (Signed)
CSW following for disposition- pt admitted from independent living facility- has HHPT through facility. Discussed DC plan with pt's daughters [name] and [name]. Reviewed therapy evaluations stating pt ambulating 100 ft with walker and recommendation for SNF/24/7supervision.  Educated family re: SNF placement and need for insurance pre-authorization [facility] Medicare). Discussed possibility that authorization would be declined as pt ambulating 100 ft. Daughters state, "She is getting around really well." Both daughters deliberated placement options. CSW discussed that if bed found and insurance authorization is denied, family would be expected to pay out-of-pocket for SNF costs. Family/pt opt for pt to return home to her independent living community to continue receiving HHPT- CSW also provided them with private duty aid agency list to consider assisting them with 24/7 supervision. They state for most part family available.  Plan: Pt returning home at DC. No further needs identified, CSW signing off.   Sharren Bridge, MSW, LCSW Clinical Social Work 08/09/2017 [phone]

## 2017-08-09 NOTE — Discharge Summary (Signed)
Physician Discharge Summary  Pamela Mcguire NOB:[id number] DOB: [birth year] DOA: 08/05/2017  PCP: Golden Circle, FNP  Admit date: 08/05/2017 Discharge date: 08/09/2017  Admitted From: [facility] Disposition: [facility], refused SNF. Family will provide care.   Recommendations for Outpatient Follow-up:  1. Follow up with PCP in 1 week 2. Follow up with Dr. Debara Pickett in 1-2 weeks 3. Please obtain BMP in 1 week to check potassium level while on lasix  4. Please follow up on the following pending results: pleural fluid pH   Home Health: PT OT RN  Equipment/Devices: None    Discharge Condition: Stable CODE STATUS: Full  Diet recommendation: Heart healthy   Brief/Interim Summary: Pamela Mcguire is a 82 yo female with past medical history of A Fib, COPD and chronic respiratory failure on home oxygen, hx breast cancer in remission; she presents with 5 day history of progressively worsening shortness of breath, easy fatigability as well as diffuse sternal, nonradiating chest pain. In the emergency department, patient was in atrial fibrillation with rapid ventricular response. She was started on IV Cardizem bolus with drip, admitted to stepdown unit. CTA chest was negative for pulmonary embolism, but revealed bilateral pleural effusions. She underwent right sided thoracentesis on 9/10, fluid studies were transudative. Echocardiogram revealed preserved ejection fraction of 60%, she was started on daily Lasix. Her heart rate remained controlled, medication transition to oral Cardizem. She will be discharged home with oral Lasix, follow up with her primary care physician and cardiologist.  Discharge Diagnoses:  Principal Problem:   Atrial fibrillation with rapid ventricular response (HCC)   Paroxysmal A Fib with RVR -Follows with Dr. Debara Pickett  -CHADSVASc 2 -Continue eliquis  -Now off cardizem gtt, transitioned to oral cardizem 240mg  CD daily  -Rate controlled  this morning   Bilateral pleural effusion -PCCM consulted by Dr. Vista Lawman  -Right sided thoracentesis 08/07/17, fluid studies are transudative. Cytology showed reactive mesothelial cells  -Echo with preserved EF 60-65%, normal wall motion  -Pleural effusion could be secondary to her long-standing history of paroxysmal A Fib as well as diastolic heart dysfunction. Improved with IV lasix, transition to oral on discharge   Atypical chest pain -Echo without wall motion abnormalities, troponin negative, EKG with A Fib without significant ST changes   History of lung adenocarcinoma -Now in remission. Follows with Dr. Julien Nordmann  Chronic hypoxemic respiratory failure -Continue home O2  HLD -Continue lipitor  Depression -Continue Wellbutrin, Lexapro    Discharge Instructions  Discharge Instructions    Diet - low sodium heart healthy    Complete by:  As directed    Discharge instructions    Complete by:  As directed    You were cared for by a hospitalist during your hospital stay. If you have any questions about your discharge medications or the care you received while you were in the hospital after you are discharged, you can call the unit and asked to speak with the hospitalist on call if the hospitalist that took care of you is not available. Once you are discharged, your primary care physician will handle any further medical issues. Please note that NO REFILLS for any discharge medications will be authorized once you are discharged, as it is imperative that you return to your primary care physician (or establish a relationship with a primary care physician if you do not have one) for your aftercare needs so that they can reassess your need for medications and monitor your lab values.   Increase activity  slowly    Complete by:  As directed      Allergies as of 08/09/2017   No Known Allergies     Medication List    STOP taking these medications   carvedilol 3.125 MG  tablet Commonly known as:  COREG     TAKE these medications   albuterol 108 (90 Base) MCG/ACT inhaler Commonly known as:  PROVENTIL HFA;VENTOLIN HFA Inhale 1 puff into the lungs every 6 (six) hours as needed for wheezing or shortness of breath. Reported on 06/01/2016   apixaban 5 MG Tabs tablet Commonly known as:  ELIQUIS Take 1 tablet by mouth twice daily. Appoint,ent needed for further refills - call cardiologist to schedule follow up visit-   atorvastatin 40 MG tablet Commonly known as:  LIPITOR TAKE 1 TABLET(40 MG) BY MOUTH DAILY   buPROPion 100 MG 12 hr tablet Commonly known as:  WELLBUTRIN SR TAKE 1 TABLET(100 MG) BY MOUTH DAILY   CALCIUM-VITAMIN D PO Take 1 tablet by mouth daily.   diltiazem 240 MG 24 hr capsule Commonly known as:  CARDIZEM CD Take 1 capsule (240 mg total) by mouth daily.   escitalopram 10 MG tablet Commonly known as:  LEXAPRO TAKE 1 TABLET(10 MG) BY MOUTH DAILY. FOLLOW-UP APPOINTMENT IS DUE   fluticasone furoate-vilanterol 100-25 MCG/INH Aepb Commonly known as:  BREO ELLIPTA Inhale 1 puff into the lungs daily.   furosemide 20 MG tablet Commonly known as:  LASIX Take 1 tablet (20 mg total) by mouth daily.   loratadine 10 MG tablet Commonly known as:  CLARITIN TAKE 1 TABLET BY MOUTH AT BEDTIME   LORazepam 2 MG tablet Commonly known as:  ATIVAN TAKE 1 TABLET BY MOUTH AT BEDTIME AS NEEDED FOR ANXIETY   multivitamin with minerals tablet Take 1 tablet by mouth daily.   NUTRITIONAL SUPPLEMENT Liqd Take 120 mLs by mouth 3 (three) times daily. [employer]   ofloxacin 0.3 % ophthalmic solution Commonly known as:  OCUFLOX   oxybutynin 5 MG tablet Commonly known as:  DITROPAN TAKE 1 TABLET BY MOUTH TWICE DAILY   oxyCODONE 5 MG immediate release tablet Commonly known as:  Oxy IR/ROXICODONE Take 0.5-1 tablets (2.5-5 mg total) by mouth every 6 (six) hours as needed for severe pain.   OXYGEN Inhale 2 L into the lungs as needed.   pantoprazole 40  MG tablet Commonly known as:  PROTONIX TAKE 1 TABLET BY MOUTH EVERY DAY   prednisoLONE acetate 1 % ophthalmic suspension Commonly known as:  PRED FORTE INT 1 GTT INTO SURGICAL EYE QID AFTER SURGERY            Discharge Care Instructions        Start     Ordered   08/10/17 0000  diltiazem (CARDIZEM CD) 240 MG 24 hr capsule  Daily     08/09/17 1230   08/10/17 0000  furosemide (LASIX) 20 MG tablet  Daily     08/09/17 1230   08/09/17 0000  Increase activity slowly     08/09/17 1230   08/09/17 0000  Diet - low sodium heart healthy     08/09/17 1230   08/09/17 0000  Discharge instructions    Comments:  You were cared for by a hospitalist during your hospital stay. If you have any questions about your discharge medications or the care you received while you were in the hospital after you are discharged, you can call the unit and asked to speak with the hospitalist on call if the hospitalist  that took care of you is not available. Once you are discharged, your primary care physician will handle any further medical issues. Please note that NO REFILLS for any discharge medications will be authorized once you are discharged, as it is imperative that you return to your primary care physician (or establish a relationship with a primary care physician if you do not have one) for your aftercare needs so that they can reassess your need for medications and monitor your lab values.   08/09/17 1230     Follow-up Information    Golden Circle, FNP. Schedule an appointment as soon as possible for a visit in 1 week(s).   Specialties:  Family Medicine, Infectious Diseases Contact information: [place] [address] [phone]        Pixie Casino, MD. Schedule an appointment as soon as possible for a visit in 1 week(s).   Specialty:  Cardiology Contact information: [place] [address] [phone]          No Known  Allergies  Consultations:  PCCM   Procedures/Studies: Dg Chest 1 View  Result Date: 08/07/2017 CLINICAL DATA:  Status post right-sided thoracentesis EXAM: CHEST 1 VIEW COMPARISON:  August 07, 2017 FINDINGS: No evident pneumothorax. There is mild scarring in the right base. There are rather minimal pleural effusions present currently. There is mild scarring in the right upper lobe. No edema or consolidation. Heart is mildly enlarged with pulmonary vascularity within normal limits. No adenopathy. There is aortic atherosclerosis. There are surgical clips the right axillary region. IMPRESSION: Scarring right upper lobe and right base. Small pleural effusions bilaterally. No pneumothorax. Stable cardiac prominence. There is aortic atherosclerosis. Aortic Atherosclerosis (ICD10-I70.0). Electronically Signed   By: Lowella Grip III M.D.   On: 08/07/2017 12:07   Dg Chest 1 View  Result Date: 08/07/2017 CLINICAL DATA:  Chest pain in shortness of breath. History of hypertension, breast and lung cancer. EXAM: CHEST 1 VIEW COMPARISON:  CT chest June 04, 2017 and multiple priors. FINDINGS: The cardiac silhouette is mildly enlarged and unchanged. Calcified aortic knob. Moderate RIGHT pleural effusion, no residual LEFT pleural effusion by portable radiography. Patchy RIGHT upper lobe airspace opacity. No pneumothorax. Surgical clips project in RIGHT axilla. Osseous structures are nonsuspicious. IMPRESSION: Moderate RIGHT and no residual LEFT pleural effusions. Patchy RIGHT upper lobe airspace opacity previously attributed to postradiation change. Mild cardiomegaly and chronic interstitial changes. Electronically Signed   By: Elon Alas M.D.   On: 08/07/2017 06:50   Dg Chest 2 View  Result Date: 08/09/2017 CLINICAL DATA:  82 year old female with a history of pleural effusion. Shortness of breath EXAM: CHEST  2 VIEW COMPARISON:  08/07/2017, ultrasound thoracentesis 08/07/2017, prior CT 08/05/2017,  chest x-ray 09/08/2016 FINDINGS: Cardiomediastinal silhouette unchanged. Calcifications of the aortic arch. Opacity at the right lung base with partial obscuration of the right hemidiaphragm. Peak appearance of the diaphragm with up lifting of the minor fissure. No pneumothorax. Ill-defined airspace and interstitial opacity at the apex of the right lung, particularly along the mediastinum. Compared to the most recent plain film there is similar appearance at the right lung base, with the lateral view demonstrating blunting of the costophrenic sulcus. Coarsened interstitial markings, similar to prior with mild interlobular septal thickening. IMPRESSION: Significantly decreased pleural effusion from the plain film of 08/07/2017, with small persisting right pleural effusion. No pneumothorax. Peak appearance of the right diaphragm, potentially combination of right upper lobe volume loss, treatment changes, scarring/atelectasis,  and/ or consolidation. Right upper lobe volume loss, potentially a combination of treatment changes and/or consolidation. This is relatively unchanged when compared to the plain film of 09/08/2016. Electronically Signed   By: Corrie Mckusick D.O.   On: 08/09/2017 09:35   Dg Chest 2 View  Result Date: 08/05/2017 CLINICAL DATA:  Right chest pain. History of lung cancer breast cancer colon cancer EXAM: CHEST  2 VIEW COMPARISON:  CT chest 07/04/2017.  Chest x-ray 09/08/2016 FINDINGS: Right upper lobe density most compatible with post radiation scarring similar to the prior CT. Associated pleural thickening in the right apex also unchanged. Elevated right hemidiaphragm with right lower lobe atelectasis. Left lung clear Heart size upper normal.  Negative for heart failure or edema. IMPRESSION: Chronic scarring right upper lobe related to prior cancer treatment. No superimposed acute abnormality. Electronically Signed   By: Franchot Gallo M.D.   On: 08/05/2017 13:56   Ct Angio Chest Pe W And/or Wo  Contrast  Result Date: 08/05/2017 CLINICAL DATA:  Patient with history of lung, breast and colon cancer. Chest pain. Shortness of breath. Evaluate for PE. EXAM: CT ANGIOGRAPHY CHEST WITH CONTRAST TECHNIQUE: Multidetector CT imaging of the chest was performed using the standard protocol during bolus administration of intravenous contrast. Multiplanar CT image reconstructions and MIPs were obtained to evaluate the vascular anatomy. CONTRAST:  100 cc Isovue 370 COMPARISON:  CT CAP 07/04/2017 FINDINGS: Cardiovascular: Heart is enlarged. No pericardial effusion. Coronary arterial vascular calcifications. Adequate opacification of the pulmonary arterial system. Motion artifact limits evaluation. No evidence for acute pulmonary embolus. Mediastinum/Nodes: Right axillary surgical clips. No left axillary adenopathy. Interval development of a 1.3 cm prevascular lymph node (image 30; series 4). Lungs/Pleura: Expiratory phase of imaging. Interval development of large right and moderate left layering pleural effusions. New bilateral patchy ground-glass pulmonary opacities and interlobular septal thickening. Right mid and upper lung paramediastinal post radiation changes, largely obscured due to large right pleural effusion. Re- demonstrated 10 mm right lower lobe pulmonary nodule (image 58; series 11). Upper Abdomen: No acute process. Musculoskeletal: Thoracic spine degenerative changes. No aggressive or acute appearing osseous lesions. Review of the MIP images confirms the above findings. IMPRESSION: 1. No evidence for acute pulmonary embolus. 2. Interval development of large right and moderate left pleural effusions resulting in subpleural consolidative opacities within the bilateral lungs. 3. New bilateral ground-glass pulmonary opacities and interlobular septal thickening most suggestive of pulmonary edema. 4. Difficult to assess/evaluate right paramediastinal post radiation changes given distortion from large right pleural  effusion. 5. Aortic atherosclerosis. 6. Interval development of mildly enlarged prevascular lymph node. Recommend attention on follow-up. Electronically Signed   By: Lovey Newcomer M.D.   On: 08/05/2017 16:00   US Thoracentesis Asp Pleural Space W/img Guide  Result Date: 08/07/2017 INDICATION: Patient with history of breast, colon and lung cancers. Bilateral pleural effusions ,right greater than left. Request made for diagnostic and therapeutic right thoracentesis. EXAM: ULTRASOUND GUIDED DIAGNOSTIC AND THERAPEUTIC RIGHT THORACENTESIS MEDICATIONS: None. COMPLICATIONS: None immediate. PROCEDURE: An ultrasound guided thoracentesis was thoroughly discussed with the patient and questions answered. The benefits, risks, alternatives and complications were also discussed. The patient understands and wishes to proceed with the procedure. Written consent was obtained. Ultrasound was performed to localize and mark an adequate pocket of fluid in the right chest. The area was then prepped and draped in the normal sterile fashion. 1% Lidocaine was used for local anesthesia. Under ultrasound guidance a Safe-T-Centesis catheter was introduced. Thoracentesis was performed. The catheter was  removed and a dressing applied. FINDINGS: A total of approximately 1.3 liters of slightly hazy, yellow fluid was removed. Samples were sent to the laboratory as requested by the clinical team. IMPRESSION: Successful ultrasound guided diagnostic and therapeutic right thoracentesis yielding 1.3 liters of pleural fluid. Read by: Rowe Robert, PA-C Electronically Signed   By: Aletta Edouard M.D.   On: 08/07/2017 13:10      Discharge Exam: Vitals:   08/09/17 0958 08/09/17 1100  BP:  (!) 139/49  Pulse:  89  Resp:    Temp:    SpO2: 94% 98%   Vitals:   08/09/17 0038 08/09/17 0617 08/09/17 0958 08/09/17 1100  BP: 139/70 140/76  (!) 139/49  Pulse: 79 65  89  Resp:  18    Temp:  (!) 97.5 F (36.4 C)    TempSrc:  Oral    SpO2:  98% 94%  98%  Weight:  84.3 kg (185 lb 13.6 oz)    Height:        General: Pt is alert, awake, not in acute distress Cardiovascular: Irregular rhythm, rate 90s, S1/S2 +, no rubs, no gallops Respiratory: CTA bilaterally, diminished bases, no wheezing, no rhonchi Abdominal: Soft, NT, ND, bowel sounds + Extremities: no edema, no cyanosis    The results of significant diagnostics from this hospitalization (including imaging, microbiology, ancillary and laboratory) are listed below for reference.     Microbiology: Recent Results (from the past 240 hour(s))  MRSA PCR Screening     Status: None   Collection Time: 08/05/17  6:02 PM  Result Value Ref Range Status   MRSA by PCR NEGATIVE NEGATIVE Final    Comment:        The GeneXpert MRSA Assay (FDA approved for NASAL specimens only), is one component of a comprehensive MRSA colonization surveillance program. It is not intended to diagnose MRSA infection nor to guide or monitor treatment for MRSA infections.   Culture, body fluid-bottle     Status: None (Preliminary result)   Collection Time: 08/07/17 10:51 AM  Result Value Ref Range Status   Specimen Description PLEURAL RIGHT  Final   Special Requests NONE  Final   Culture   Final    NO GROWTH < 24 HOURS Performed at [facility], [place] [address], [place], [place] [address]    Report Status PENDING  Incomplete  Gram stain     Status: None   Collection Time: 08/07/17 10:51 AM  Result Value Ref Range Status   Specimen Description PLEURAL RIGHT  Final   Special Requests NONE  Final   Gram Stain   Final    ABUNDANT WBC PRESENT,BOTH PMN AND MONONUCLEAR NO ORGANISMS SEEN Performed at [facility], [address] [place], [place] [address]    Report Status 08/07/2017 FINAL  Final     Labs: BNP (last 3 results)  Recent Labs  08/05/17 1600 08/05/17 1835 08/07/17 0148  BNP 447.6* 530.2* 098.1*   Basic Metabolic Panel:  Recent Labs Lab 08/05/17 1351 08/05/17 1445  08/05/17 1835 08/06/17 0354 08/07/17 0148 08/08/17 0406 08/09/17 0447  NA 139 141  --  140 137 138 135  K 3.9 3.9  --  3.7 3.1* 4.0 3.8  CL 106 103  --  100* 98* 100* 95*  CO2 27  --   --  29 28 27 29   GLUCOSE 191* 128*  --  128* 124* 129* 136*  BUN 15 15  --  15 14 12  12  CREATININE 0.97 0.90  --  1.01* 0.84 0.92 0.78  CALCIUM 8.6*  --   --  8.7* 8.6* 8.9 9.0  MG  --   --  1.6*  --  1.4* 1.8 1.8   Liver Function Tests:  Recent Labs Lab 08/05/17 1351 08/07/17 1343  AST 15 17  ALT 15 15  ALKPHOS 47 63  BILITOT 0.7 0.6  PROT 6.4* 7.0  ALBUMIN 3.2* 3.3*   No results for input(s): LIPASE, AMYLASE in the last 168 hours. No results for input(s): AMMONIA in the last 168 hours. CBC:  Recent Labs Lab 08/05/17 1305 08/05/17 1445 08/07/17 0148 08/08/17 0406 08/09/17 0447  WBC 5.7  --  7.5 7.9 6.9  NEUTROABS  --   --   --  6.4 5.1  HGB 11.1* 11.6* 11.8* 12.8 13.2  HCT 34.7* 34.0* 36.5 40.4 40.6  MCV 93.0  --  90.6 93.5 91.4  PLT 289  --  294 302 302   Cardiac Enzymes:  Recent Labs Lab 08/06/17 0354 08/06/17 1350 08/06/17 2006 08/07/17 0148  TROPONINI <0.03 <0.03 <0.03 <0.03   BNP: Invalid input(s): POCBNP CBG: No results for input(s): GLUCAP in the last 168 hours. D-Dimer No results for input(s): DDIMER in the last 72 hours. Hgb A1c No results for input(s): HGBA1C in the last 72 hours. Lipid Profile No results for input(s): CHOL, HDL, LDLCALC, TRIG, CHOLHDL, LDLDIRECT in the last 72 hours. Thyroid function studies No results for input(s): TSH, T4TOTAL, T3FREE, THYROIDAB in the last 72 hours.  Invalid input(s): FREET3 Anemia work up No results for input(s): VITAMINB12, FOLATE, FERRITIN, TIBC, IRON, RETICCTPCT in the last 72 hours. Urinalysis    Component Value Date/Time   COLORURINE YELLOW 08/07/2017 1213   APPEARANCEUR CLEAR 08/07/2017 1213   LABSPEC 1.012 08/07/2017 1213   LABSPEC 1.010 06/02/2015 1706   PHURINE 6.0 08/07/2017 1213   GLUCOSEU  NEGATIVE 08/07/2017 1213   GLUCOSEU NEGATIVE 07/10/2017 1633   GLUCOSEU Negative 06/02/2015 1706   HGBUR NEGATIVE 08/07/2017 1213   BILIRUBINUR NEGATIVE 08/07/2017 1213   BILIRUBINUR 3+      4mg /dl 09/08/2016 1133   KETONESUR NEGATIVE 08/07/2017 1213   PROTEINUR NEGATIVE 08/07/2017 1213   UROBILINOGEN 0.2 07/10/2017 1633   UROBILINOGEN 0.2 06/02/2015 1706   NITRITE NEGATIVE 08/07/2017 1213   LEUKOCYTESUR TRACE (A) 08/07/2017 1213   LEUKOCYTESUR Moderate 06/02/2015 1706   Sepsis Labs Invalid input(s): PROCALCITONIN,  WBC,  LACTICIDVEN Microbiology Recent Results (from the past 240 hour(s))  MRSA PCR Screening     Status: None   Collection Time: 08/05/17  6:02 PM  Result Value Ref Range Status   MRSA by PCR NEGATIVE NEGATIVE Final    Comment:        The GeneXpert MRSA Assay (FDA approved for NASAL specimens only), is one component of a comprehensive MRSA colonization surveillance program. It is not intended to diagnose MRSA infection nor to guide or monitor treatment for MRSA infections.   Culture, body fluid-bottle     Status: None (Preliminary result)   Collection Time: 08/07/17 10:51 AM  Result Value Ref Range Status   Specimen Description PLEURAL RIGHT  Final   Special Requests NONE  Final   Culture   Final    NO GROWTH < 24 HOURS Performed at [facility], [place] [address] [place],  [address]    Report Status PENDING  Incomplete  Gram stain     Status: None   Collection Time: 08/07/17 10:51 AM  Result  Value Ref Range Status   Specimen Description PLEURAL RIGHT  Final   Special Requests NONE  Final   Gram Stain   Final    ABUNDANT WBC PRESENT,BOTH PMN AND MONONUCLEAR NO ORGANISMS SEEN Performed at [facility], [address], [place], [place] [address]    Report Status 08/07/2017 FINAL  Final     Time coordinating discharge: 40 minutes  SIGNED:  Dessa Phi, DO [facility] Pager [phone]  If 7PM-7AM, please contact  night-coverage [email or web address] Password [facility] 08/09/2017, 12:32 PM

## 2017-08-09 NOTE — Evaluation (Signed)
Physical Therapy Evaluation Patient Details Name: Brigette Hopfer MRN: [id number] DOB: 24-Sep-1935 Today's Date: 08/09/2017   History of Present Illness  82 yo female admitted with A fib, UTI, bil pleural effusion. hx of A fib, L1 comp fx, COPD, breast cancer, lung cancer, colon cancer, dementia, colectomy 01/2017     Clinical Impression  On eval, pt required Min assist for mobility. She walked ~100 feet with a rollator. Pt c/o dizziness with OOB activity. O2 sat 89% on 2L [place] O2 during ambulation. Pt presents with general weakness, decreased activity tolerance, and impaired gait and balance. No family present during session. Will follow and progress activity as tolerated.     Follow Up Recommendations SNF (HHPT and 24 hour supervision if pt/family decline placement)    Equipment Recommendations  None recommended by PT    Recommendations for Other Services       Precautions / Restrictions Precautions Precautions: Fall Precaution Comments: monitor O2 sats Restrictions Weight Bearing Restrictions: No      Mobility  Bed Mobility Overal bed mobility: Needs Assistance Bed Mobility: Supine to Sit;Sit to Supine     Supine to sit: Supervision;HOB elevated Sit to supine: Supervision;HOB elevated   General bed mobility comments: for safety, lines  Transfers Overall transfer level: Needs assistance Equipment used: 4-wheeled walker Transfers: Sit to/from Stand Sit to Stand: Min assist         General transfer comment: Assist to rise, stabilize, control descent. VCs safety, hand placement. Pt c/o dizziness.   Ambulation/Gait Ambulation/Gait assistance: Min assist Ambulation Distance (Feet): 100 Feet Assistive device: 4-wheeled walker Gait Pattern/deviations: Step-through pattern;Decreased stride length     General Gait Details: Assist to stabilize throughout distance. Pt c/o dizziness. O2 sat 89% on 2L [place] O2. Pt fatigues fairly easily.   Stairs            Wheelchair  Mobility    Modified Rankin (Stroke Patients Only)       Balance Overall balance assessment: Needs assistance         Standing balance support: Bilateral upper extremity supported Standing balance-Leahy Scale: Poor Standing balance comment: requires RW                             Pertinent Vitals/Pain Pain Assessment: No/denies pain    Home Living Family/patient expects to be discharged to:: Private residence   Available Help at Discharge: Family;Available PRN/intermittently Type of Home: Independent living facility Home Access: Level entry     Home Layout: One level Home Equipment: Walker - 4 wheels;Grab bars - tub/shower;Grab bars - toilet      Prior Function Level of Independence: Needs assistance   Gait / Transfers Assistance Needed: uses rollator. walks to dining room  ADL's / Homemaking Assistance Needed: daughters assist/supervise as needed        Hand Dominance        Extremity/Trunk Assessment   Upper Extremity Assessment Upper Extremity Assessment: Overall WFL for tasks assessed    Lower Extremity Assessment Lower Extremity Assessment: Generalized weakness    Cervical / Trunk Assessment Cervical / Trunk Assessment: Normal  Communication   Communication: HOH  Cognition Arousal/Alertness: Awake/alert Behavior During Therapy: WFL for tasks assessed/performed Overall Cognitive Status: History of cognitive impairments - at baseline  General Comments      Exercises     Assessment/Plan    PT Assessment Patient needs continued PT services  PT Problem List Decreased strength;Decreased mobility;Decreased activity tolerance;Decreased balance;Decreased cognition;Decreased knowledge of use of DME       PT Treatment Interventions DME instruction;Gait training;Therapeutic activities;Therapeutic exercise;Patient/family education;Balance training;Functional mobility training     PT Goals (Current goals can be found in the Care Plan section)  Acute Rehab PT Goals Patient Stated Goal: home PT Goal Formulation: With patient Time For Goal Achievement: 08/23/17 Potential to Achieve Goals: Good    Frequency Min 3X/week   Barriers to discharge        Co-evaluation               AM-PAC PT "6 Clicks" Daily Activity  Outcome Measure Difficulty turning over in bed (including adjusting bedclothes, sheets and blankets)?: None Difficulty moving from lying on back to sitting on the side of the bed? : None Difficulty sitting down on and standing up from a chair with arms (e.g., wheelchair, bedside commode, etc,.)?: Unable Help needed moving to and from a bed to chair (including a wheelchair)?: A Little Help needed walking in hospital room?: A Little Help needed climbing 3-5 steps with a railing? : A Little 6 Click Score: 18    End of Session Equipment Utilized During Treatment: Gait belt;Oxygen Activity Tolerance: Patient tolerated treatment well Patient left: in bed;with call bell/phone within reach;with bed alarm set   PT Visit Diagnosis: Muscle weakness (generalized) (M62.81);Difficulty in walking, not elsewhere classified (R26.2)    Time: 2376-2831 PT Time Calculation (min) (ACUTE ONLY): 22 min   Charges:   PT Evaluation $PT Eval Low Complexity: 1 Low     PT G Codes:   PT G-Codes **NOT FOR INPATIENT CLASS** Functional Assessment Tool Used: Clinical judgement;AM-PAC 6 Clicks Basic Mobility Functional Limitation: Mobility: Walking and moving around Mobility: Walking and Moving Around Current Status (D1761): At least 20 percent but less than 40 percent impaired, limited or restricted Mobility: Walking and Moving Around Goal Status [phone]): At least 1 percent but less than 20 percent impaired, limited or restricted      Weston Anna, MPT Pager: [phone]

## 2017-08-11 ENCOUNTER — Ambulatory Visit: Payer: Medicare HMO | Admitting: Student

## 2017-08-11 ENCOUNTER — Telehealth: Payer: Self-pay | Admitting: *Deleted

## 2017-08-11 NOTE — Telephone Encounter (Signed)
Tried Scientist, research (medical) (donna) to set-up TCM hosp f/u appt no answer LMOM RTC...Pamela Mcguire

## 2017-08-12 LAB — CULTURE, BODY FLUID W GRAM STAIN -BOTTLE

## 2017-08-12 LAB — CULTURE, BODY FLUID-BOTTLE: CULTURE: NO GROWTH

## 2017-08-13 ENCOUNTER — Other Ambulatory Visit: Payer: Self-pay | Admitting: Family

## 2017-08-14 NOTE — Telephone Encounter (Signed)
Called daughter [name]) again no answer LMOM needing to make hosp f/u appt for mom. Please contact office to schedule. Closing encounter...Pamela Mcguire

## 2017-08-15 ENCOUNTER — Encounter: Payer: Self-pay | Admitting: Physician Assistant

## 2017-08-15 ENCOUNTER — Ambulatory Visit (INDEPENDENT_AMBULATORY_CARE_PROVIDER_SITE_OTHER): Payer: Medicare HMO | Admitting: Physician Assistant

## 2017-08-15 ENCOUNTER — Telehealth: Payer: Self-pay | Admitting: Family

## 2017-08-15 ENCOUNTER — Other Ambulatory Visit: Payer: Self-pay | Admitting: Family

## 2017-08-15 VITALS — BP 108/72 | HR 54 | Ht 66.0 in | Wt 183.0 lb

## 2017-08-15 DIAGNOSIS — I10 Essential (primary) hypertension: Secondary | ICD-10-CM | POA: Diagnosis not present

## 2017-08-15 DIAGNOSIS — I481 Persistent atrial fibrillation: Secondary | ICD-10-CM

## 2017-08-15 DIAGNOSIS — C3490 Malignant neoplasm of unspecified part of unspecified bronchus or lung: Secondary | ICD-10-CM

## 2017-08-15 DIAGNOSIS — R7303 Prediabetes: Secondary | ICD-10-CM

## 2017-08-15 DIAGNOSIS — G4733 Obstructive sleep apnea (adult) (pediatric): Secondary | ICD-10-CM

## 2017-08-15 DIAGNOSIS — E785 Hyperlipidemia, unspecified: Secondary | ICD-10-CM | POA: Diagnosis not present

## 2017-08-15 DIAGNOSIS — J90 Pleural effusion, not elsewhere classified: Secondary | ICD-10-CM | POA: Diagnosis not present

## 2017-08-15 DIAGNOSIS — J9611 Chronic respiratory failure with hypoxia: Secondary | ICD-10-CM

## 2017-08-15 DIAGNOSIS — Z79899 Other long term (current) drug therapy: Secondary | ICD-10-CM

## 2017-08-15 DIAGNOSIS — I4819 Other persistent atrial fibrillation: Secondary | ICD-10-CM

## 2017-08-15 DIAGNOSIS — J9612 Chronic respiratory failure with hypercapnia: Secondary | ICD-10-CM | POA: Diagnosis not present

## 2017-08-15 MED ORDER — DIGOXIN 62.5 MCG PO TABS
0.0625 mg | ORAL_TABLET | Freq: Every day | ORAL | 5 refills | Status: DC
Start: 2017-08-15 — End: 2017-08-16

## 2017-08-15 MED ORDER — PANTOPRAZOLE SODIUM 40 MG PO TBEC: 40.0000 mg | DELAYED_RELEASE_TABLET | Freq: Every day | ORAL | 1 refills | Status: DC

## 2017-08-15 NOTE — Progress Notes (Signed)
Cardiology Office Note    Date:  08/16/2017   ID:  Pamela Mcguire, DOB 18-Sep-1935, MRN [id number]  PCP:  [place], FNP  Cardiologist:  Dr. Debara Pickett   Chief Complaint  Patient presents with  . Follow-up    seen for Dr. Debara Pickett.     History of Present Illness:  Pamela Mcguire is a 82 y.o. female with PMH of PAF, CAD, HTN, HLD, OSA, non small cell carcinoma of lung and prediabetes. She previously had episode of atrial fibrillation while suffering from diverticulitis, she was started on Rythmol at the time and was later started on eliquis in the office. She had a Myoview on 06/11/2014 that showed EF 69%, no ischemia. She has a history of obstructive sleep apnea, however intolerant to CPAP therapy. Her last office visit with Dr. Debara Pickett was on 03/01/2016, she just completed chemotherapy for lung cancer at the time. She was no longer on Rythmol, however no obvious recurrence of atrial fibrillation on rate control therapy. Her Vytorin was stopped, she was started on Lipitor 40 mg daily. Most recently, patient was admitted on 9/80/2018 was progressive worsening shortness of breath, while in the ED, she was noted to be tachycardic with EKG revealing atrial fibrillation with RVR. CTA of the chest was negative for PE but revealed bilateral pleural effusion. IV Cardizem was initiated. She eventually underwent right-sided thoracentesis on 08/07/2017 with removal 1.3 L of slightly disease transudative fluid. Echocardiogram showed EF 60%.  Patient presents today for cardiology office visit. Despite initial machine reading of heart rate 54, on physical exam, her heart rate obviously is quite fast. EKG obtained subsequently showed atrial fibrillation with heart rate of 111. I am quite concerned about her breathing. She has been on 2 L oxygen for the past year. According to the daughter, she is only partially compliant with it. She gets short of breath walking a short distance. On physical exam, she does not appear to be  volume overloaded. Even the right base of the lung where she had thoracentesis have improved air movement. However she appears to be very frail and her blood pressure is borderline. I am unable to up titrate the diltiazem. However I added low-dose digoxin to try to control her heart rate at low bit better without affecting her blood pressure. Overall, I am quite concerned about her breathing and want her to follow-up back within a week. I think some of her fatigue and shortness of breath is likely related to atrial fibrillation. She says she also has been having yellow productive cough, she is at very high risk for readmission for shortness of breath. She is also at risk for pneumonia as well but she denies any fever. Ideally, converting her back to sinus rhythm may help her breathing. However with her current breathing issue, I don't think she is capable of undergoing sedation. Otherwise, she denies any chest pain.    Past Medical History:  Diagnosis Date  . AF (atrial fibrillation) ([place])   . Anxiety   . Arthritis    "thighs, hips, arms" (04/28/2015)  . Breast cancer ([place])    right mastectomy  . CAD (coronary artery disease)   . Dehydration 12/06/2016  . Dementia   . Depression   . Diverticulitis   . GERD (gastroesophageal reflux disease)   . HCAP (healthcare-associated pneumonia) 09/15/2015  . Hyperlipidemia   . Hypertension   . Non-small cell carcinoma of lung, stage 3 ([place]) 04/30/2015  . Prediabetes   . Seasonal allergies   .  Shortness of breath dyspnea   . Sleep apnea     dx'd 10/2014,refused cpap machine  . Urinary frequency   . UTI (urinary tract infection) 06/06/2015    Past Surgical History:  Procedure Laterality Date  . BREAST SURGERY    . CARDIOVERSION    . Cataract surgery    . CESAREAN SECTION  X 2  . COLON SURGERY    . COLONOSCOPY    . COLONOSCOPY N/A 01/13/2017   Procedure: COLONOSCOPY;  Surgeon: Gatha Mayer, MD;  Location: [facility] ENDOSCOPY;  Service: Endoscopy;   Laterality: N/A;  . ESOPHAGOGASTRODUODENOSCOPY N/A 01/13/2017   Procedure: ESOPHAGOGASTRODUODENOSCOPY (EGD);  Surgeon: Gatha Mayer, MD;  Location: [facility];  Service: Endoscopy;  Laterality: N/A;  . LAPAROSCOPIC PARTIAL COLECTOMY N/A 02/08/2017   Procedure: LAPAROSCOPIC PARTIAL COLECTOMY;  Surgeon: Leighton Ruff, MD;  Location: [facility] ORS;  Service: General;  Laterality: N/A;  . MASTECTOMY Right   . MEDIASTINOSCOPY N/A 04/28/2015   Procedure: MEDIASTINOSCOPY;  Surgeon: [name], MD;  Location: [facility] OR;  Service: Thoracic;  Laterality: N/A;  . TONSILLECTOMY      Current Medications: Outpatient Medications Prior to Visit  Medication Sig Dispense Refill  . albuterol (PROVENTIL HFA;VENTOLIN HFA) 108 (90 Base) MCG/ACT inhaler Inhale 1 puff into the lungs every 6 (six) hours as needed for wheezing or shortness of breath. Reported on 06/01/2016 18 g 1  . apixaban (ELIQUIS) 5 MG TABS tablet Take 1 tablet by mouth twice daily. Appoint,ent needed for further refills - call cardiologist to schedule follow up visit- 60 tablet 0  . atorvastatin (LIPITOR) 40 MG tablet TAKE 1 TABLET(40 MG) BY MOUTH DAILY 90 tablet 0  . buPROPion (WELLBUTRIN SR) 100 MG 12 hr tablet TAKE 1 TABLET(100 MG) BY MOUTH DAILY 90 tablet 0  . CALCIUM-VITAMIN D PO Take 1 tablet by mouth daily.    Marland Kitchen diltiazem (CARDIZEM CD) 240 MG 24 hr capsule Take 1 capsule (240 mg total) by mouth daily. 30 capsule 0  . escitalopram (LEXAPRO) 10 MG tablet TAKE 1 TABLET(10 MG) BY MOUTH DAILY. FOLLOW-UP APPOINTMENT IS DUE 30 tablet 0  . fluticasone furoate-vilanterol (BREO ELLIPTA) 100-25 MCG/INH AEPB Inhale 1 puff into the lungs daily. 28 each 3  . furosemide (LASIX) 20 MG tablet Take 1 tablet (20 mg total) by mouth daily. 30 tablet 0  . loratadine (CLARITIN) 10 MG tablet TAKE 1 TABLET BY MOUTH AT BEDTIME 90 tablet 0  . LORazepam (ATIVAN) 2 MG tablet TAKE 1 TABLET BY MOUTH AT BEDTIME AS NEEDED FOR ANXIETY 30 tablet 0  . Multiple Vitamins-Minerals  (MULTIVITAMIN WITH MINERALS) tablet Take 1 tablet by mouth daily.    Marland Kitchen NUTRITIONAL SUPPLEMENT LIQD Take 120 mLs by mouth 3 (three) times daily. [employer]    . ofloxacin (OCUFLOX) 0.3 % ophthalmic solution   1  . oxybutynin (DITROPAN) 5 MG tablet TAKE 1 TABLET BY MOUTH TWICE DAILY 180 tablet 0  . oxyCODONE (OXY IR/ROXICODONE) 5 MG immediate release tablet Take 0.5-1 tablets (2.5-5 mg total) by mouth every 6 (six) hours as needed for severe pain. 30 tablet 0  . OXYGEN Inhale 2 L into the lungs as needed.    . prednisoLONE acetate (PRED FORTE) 1 % ophthalmic suspension INT 1 GTT INTO SURGICAL EYE QID AFTER SURGERY  1  . pantoprazole (PROTONIX) 40 MG tablet TAKE 1 TABLET BY MOUTH EVERY DAY 90 tablet 0   No facility-administered medications prior to visit.      Allergies:   Patient has  no known allergies.   Social History   Social History  . Marital status: Widowed    Spouse name: N/A  . Number of children: 4  . Years of education: 12   Occupational History  . retired    Social History Main Topics  . Smoking status: Former Smoker    Packs/day: 1.00    Years: 35.00    Types: Cigarettes    Quit date: 11/28/1992  . Smokeless tobacco: Never Used     Comment: "stopped smoking at age 82"  . Alcohol use 5.4 oz/week    9 Glasses of wine per week     Comment: 04/28/2015 "drink 3, 6oz,  glasses of wine on Sat & Sun"  . Drug use: No  . Sexual activity: No   Other Topics Concern  . None   Social History Narrative   Fun: Word search and read   Denies abuse and feels safe at home     Family History:  The patient's family history includes Mental illness in her mother.   ROS:   Please see the history of present illness.    ROS All other systems reviewed and are negative.   PHYSICAL EXAM:   VS:  BP 108/72   Pulse (!) 54   Ht 5\' 6"  (1.676 m)   Wt 183 lb (83 kg)   LMP  (LMP Unknown)   BMI 29.54 kg/m    GEN: Well nourished, well developed, in no acute distress  +on home O2 HEENT:  normal  Neck: no JVD, carotid bruits, or masses Cardiac: RRR; no murmurs, rubs, or gallops,no edema  Respiratory:  clear to auscultation bilaterally, normal work of breathing GI: soft, nontender, nondistended, + BS MS: no deformity or atrophy  Skin: warm and dry, no rash Neuro:  Alert and Oriented x 3, Strength and sensation are intact Psych: euthymic mood, full affect  Wt Readings from Last 3 Encounters:  08/15/17 183 lb (83 kg)  08/09/17 185 lb 13.6 oz (84.3 kg)  07/10/17 185 lb 6.4 oz (84.1 kg)      Studies/Labs Reviewed:   EKG:  EKG is ordered today.  The ekg ordered today demonstrates Atrial fibrillation versus multifocal atrial tachycardia, heart rate 111  Recent Labs: 08/05/2017: TSH 2.049 08/07/2017: ALT 15; B Natriuretic Peptide 236.3 08/09/2017: BUN 12; Creatinine, Ser 0.78; Hemoglobin 13.2; Magnesium 1.8; Platelets 302; Potassium 3.8; Sodium 135   Lipid Panel    Component Value Date/Time   CHOL 134 08/06/2017 0354   TRIG 94 08/06/2017 0354   HDL 37 (L) 08/06/2017 0354   CHOLHDL 3.6 08/06/2017 0354   VLDL 19 08/06/2017 0354   LDLCALC 78 08/06/2017 0354    Additional studies/ records that were reviewed today include:   Thoracentesis 08/07/2017 FINDINGS: A total of approximately 1.3 liters of slightly hazy, yellow fluid was removed. Samples were sent to the laboratory as requested by the clinical team.  IMPRESSION: Successful ultrasound guided diagnostic and therapeutic right thoracentesis yielding 1.3 liters of pleural fluid.     ASSESSMENT:    1. Chronic respiratory failure with hypoxia and hypercapnia (HCC)   2. Encounter for long-term (current) use of high-risk medication   3. Persistent atrial fibrillation ([place])   4. Hyperlipidemia, unspecified hyperlipidemia type   5. Essential hypertension   6. Prediabetes   7. Malignant neoplasm of lung, unspecified laterality, unspecified part of lung ([place])   8. OSA (obstructive sleep apnea)   9. Pleural  effusion      PLAN:  In order of problems listed above:  1. Coronary respiratory failure: Has been on 2 L nasal cannula for at least last year. She is very frail and has high probability of readmission. I asked her daughter to monitor for any fever or chill or worsening cough. She does have some degree of productive cough today. Although atrial fibrillation likely contributed to a portion of her shortness of breath, and it likely does not explain all of it.  2. Persistent atrial fibrillation: She has been having persistent atrial fibrillation since recent discharge. Heart rate is uncontrolled on today's physical exam despite machines reading of 54. I added low-dose digoxin to medical regimen. She will need a digoxin level on follow-up. Continue eliquis.  3. Breath pleural effusion: Resolved after recent thoracentesis.  4. Hypertension: Blood pressure soft today, 108/72. Despite uncontrolled heart rate, unable to up titrate the medication  5. Hyperlipidemia: On Lipitor 40 mg daily  6. Prediabetes: Last hemoglobin A1c obtained on 08/05/2017 was 6.0. We'll defer to primary care provider  7. Lung cancer: Finished chemotherapy in 2016. Followed by oncology service  8. Obstructive sleep apnea: Intolerant of CPAP therapy    Medication Adjustments/Labs and Tests Ordered: Current medicines are reviewed at length with the patient today.  Concerns regarding medicines are outlined above.  Medication changes, Labs and Tests ordered today are listed in the Patient Instructions below. Patient Instructions  Medication Instructions:   Start digoxin 0.0625mg  (62.5 mcg) one tablet daily.  Labwork:   Digoxin level same-day as return appointment  Testing/Procedures:  none  Follow-Up:  With Dr. Debara Pickett on September 27th at 3:15 pm  If you need a refill on your cardiac medications before your next appointment, please call your pharmacy.      Hilbert Corrigan, [place]  08/16/2017 6:59 AM    [facility] [address], [place], [place]  [address] Phone: [phone]; Fax: [phone]

## 2017-08-15 NOTE — Telephone Encounter (Signed)
Reviewed chart pt is up-to-date sent refills to [facility].../lmb  

## 2017-08-15 NOTE — Telephone Encounter (Signed)
Pt son in law called for a refill of the patients pantoprazole (PROTONIX) 40 MG tablet States she is out and does not have any for tomorrow morning, please call when sent to the pharmacy on file  Please advise

## 2017-08-15 NOTE — Patient Instructions (Signed)
Medication Instructions:   Start digoxin 0.0625mg  (62.5 mcg) one tablet daily.  Labwork:   Digoxin level same-day as return appointment  Testing/Procedures:  none  Follow-Up:  With Dr. Debara Pickett on September 27th at 3:15 pm  If you need a refill on your cardiac medications before your next appointment, please call your pharmacy.

## 2017-08-16 ENCOUNTER — Encounter: Payer: Self-pay | Admitting: Physician Assistant

## 2017-08-16 ENCOUNTER — Telehealth: Payer: Self-pay | Admitting: Physician Assistant

## 2017-08-16 MED ORDER — DIGOXIN 125 MCG PO TABS: 0.1250 mg | ORAL_TABLET | Freq: Every day | ORAL | 5 refills | Status: DC

## 2017-08-16 NOTE — Telephone Encounter (Signed)
Daughter called and said her insurance did not approve so far her Digoxin,it will probably need a prior authorization.

## 2017-08-16 NOTE — Telephone Encounter (Signed)
Daughter notified of increase per Almyra Deforest pa-c, she will call back with any questions  Rx sent as ordered to [facility]

## 2017-08-16 NOTE — Telephone Encounter (Signed)
S/w daughter she states that [facility] did not fill digoxin and they said that insurance will not pay for this.  S/w pharmacy she states that insurance does not pay for the digoxin at this dose, can we try something else? please advise

## 2017-08-16 NOTE — Telephone Encounter (Signed)
Can change to 125 mcg digoxin daily

## 2017-08-17 NOTE — Addendum Note (Signed)
Addended by: Theodore Demark on: 08/17/2017 09:07 AM   Modules accepted: Orders

## 2017-08-22 ENCOUNTER — Other Ambulatory Visit (INDEPENDENT_AMBULATORY_CARE_PROVIDER_SITE_OTHER): Payer: Medicare HMO

## 2017-08-22 ENCOUNTER — Encounter: Payer: Self-pay | Admitting: Family

## 2017-08-22 ENCOUNTER — Ambulatory Visit (INDEPENDENT_AMBULATORY_CARE_PROVIDER_SITE_OTHER): Payer: Medicare HMO | Admitting: Family

## 2017-08-22 VITALS — BP 140/78 | HR 88 | Temp 98.5°F | Resp 18 | Ht 66.0 in | Wt 181.0 lb

## 2017-08-22 DIAGNOSIS — Z23 Encounter for immunization: Secondary | ICD-10-CM

## 2017-08-22 DIAGNOSIS — J90 Pleural effusion, not elsewhere classified: Secondary | ICD-10-CM | POA: Diagnosis not present

## 2017-08-22 DIAGNOSIS — I480 Paroxysmal atrial fibrillation: Secondary | ICD-10-CM

## 2017-08-22 DIAGNOSIS — J948 Other specified pleural conditions: Secondary | ICD-10-CM | POA: Insufficient documentation

## 2017-08-22 LAB — COMPREHENSIVE METABOLIC PANEL
ALBUMIN: 3.9 g/dL (ref 3.5–5.2)
ALK PHOS: 55 U/L (ref 39–117)
ALT: 11 U/L (ref 0–35)
AST: 14 U/L (ref 0–37)
BILIRUBIN TOTAL: 0.6 mg/dL (ref 0.2–1.2)
BUN: 20 mg/dL (ref 6–23)
CALCIUM: 9.8 mg/dL (ref 8.4–10.5)
CO2: 33 mEq/L — ABNORMAL HIGH (ref 19–32)
Chloride: 101 mEq/L (ref 96–112)
Creatinine, Ser: 0.89 mg/dL (ref 0.40–1.20)
GFR: 64.76 mL/min (ref >60.00)
GLUCOSE: 70 mg/dL (ref 70–99)
POTASSIUM: 4.2 meq/L (ref 3.5–5.1)
Sodium: 139 mEq/L (ref 135–145)
TOTAL PROTEIN: 7.2 g/dL (ref 6.0–8.3)

## 2017-08-22 MED ORDER — LORAZEPAM 2 MG PO TABS: ORAL_TABLET | ORAL | 1 refills | Status: DC

## 2017-08-22 NOTE — Progress Notes (Signed)
Subjective:    Patient ID: Pamela Mcguire, female    DOB: May 12, 1935, 82 y.o.   MRN: [id number]  Chief Complaint  Patient presents with  . Hospitalization Follow-up    was seen for Afib, needs lungs checked    HPI:  Pamela Mcguire is a 82 y.o. female who  has a past medical history of AF (atrial fibrillation) ([place]); Anxiety; Arthritis; Breast cancer ([place]); CAD (coronary artery disease); Dehydration (12/06/2016); Dementia; Depression; Diverticulitis; GERD (gastroesophageal reflux disease); HCAP (healthcare-associated pneumonia) (09/15/2015); Hyperlipidemia; Hypertension; Non-small cell carcinoma of lung, stage 3 (HCC) (04/30/2015); Prediabetes; Seasonal allergies; Shortness of breath dyspnea; Sleep apnea; Urinary frequency; and UTI (urinary tract infection) (06/06/2015). and presents today for a follow up office visit.    Recently evaluated in the emergency department and admitted to the hospital with shortness of breath and dizziness. Symptoms were progressively worsening and started within a couple days of presentation. Physical exam appearing chronically ill and uncomfortable with tachycardia and heartbeat noted to be irregular. She had positive crackles in her bilateral bases with no calf tenderness.  X-ray of the chest showed chronic scarring of the right upper lobe related to prior cancer treatment with no superimposed acute abnormalities. CT scan of the chest showed no evidence of pulmonary embolus with interval development of large right and moderate left pleural effusions resulting in subpleural consolidative opacities within the bilateral lungs. Also noted a new bilateral groundglass pulmonary opacity and intralobular septal thickening most suggestive of pulmonary edema. Atrial fibrillation was controlled with Cardizem drip and transitioned to oral Cardizem with rate control achieved prior to discharge. Bilateral pleural effusions were treated with right sided thoracentesis with concern for pleural  effusion related to long-standing history of paroxysmal atrial fibrillation as well as diastolic heart dysfunction. She was diuresed with IV Lasix and transitioned to oral Lasix prior to discharge. Cardiac workup is negative. Hospitalist recommended follow-up basic metabolic profile and follow-up with cardiology. All hospital records, labs, and imaging reviewed in detail.  Since leaving the hospital she continues to experience shortness of breath and is easily fatigable. She reports taking the furosemide as prescribed and denies adverse side effects. She does have to go to the bathroom quite often. Does have some chest pain that feels like a tight bra that occurs primarily when moving around. Severity of the shortness of breath is causing her to have to use her oxygen which is set at 2 L via nasal cannula. Eating and drinking well. Reports taking her other medications as prescribed.    No Known Allergies    Outpatient Medications Prior to Visit  Medication Sig Dispense Refill  . albuterol (PROVENTIL HFA;VENTOLIN HFA) 108 (90 Base) MCG/ACT inhaler Inhale 1 puff into the lungs every 6 (six) hours as needed for wheezing or shortness of breath. Reported on 06/01/2016 18 g 1  . apixaban (ELIQUIS) 5 MG TABS tablet Take 1 tablet by mouth twice daily. Appoint,ent needed for further refills - call cardiologist to schedule follow up visit- 60 tablet 0  . atorvastatin (LIPITOR) 40 MG tablet TAKE 1 TABLET(40 MG) BY MOUTH DAILY 90 tablet 0  . buPROPion (WELLBUTRIN SR) 100 MG 12 hr tablet TAKE 1 TABLET(100 MG) BY MOUTH DAILY 90 tablet 0  . CALCIUM-VITAMIN D PO Take 1 tablet by mouth daily.    . digoxin (DIGOX) 0.125 MG tablet Take 1 tablet (0.125 mg total) by mouth daily. 30 tablet 5  . diltiazem (CARDIZEM CD) 240 MG 24 hr capsule Take 1 capsule (240 mg total)  by mouth daily. 30 capsule 0  . escitalopram (LEXAPRO) 10 MG tablet TAKE 1 TABLET(10 MG) BY MOUTH DAILY. FOLLOW-UP APPOINTMENT IS DUE 30 tablet 0  .  fluticasone furoate-vilanterol (BREO ELLIPTA) 100-25 MCG/INH AEPB Inhale 1 puff into the lungs daily. 28 each 3  . furosemide (LASIX) 20 MG tablet Take 1 tablet (20 mg total) by mouth daily. 30 tablet 0  . loratadine (CLARITIN) 10 MG tablet TAKE 1 TABLET BY MOUTH AT BEDTIME 90 tablet 0  . Multiple Vitamins-Minerals (MULTIVITAMIN WITH MINERALS) tablet Take 1 tablet by mouth daily.    Marland Kitchen NUTRITIONAL SUPPLEMENT LIQD Take 120 mLs by mouth 3 (three) times daily. MedPass    . ofloxacin (OCUFLOX) 0.3 % ophthalmic solution   1  . oxybutynin (DITROPAN) 5 MG tablet TAKE 1 TABLET BY MOUTH TWICE DAILY 180 tablet 0  . oxyCODONE (OXY IR/ROXICODONE) 5 MG immediate release tablet Take 0.5-1 tablets (2.5-5 mg total) by mouth every 6 (six) hours as needed for severe pain. 30 tablet 0  . OXYGEN Inhale 2 L into the lungs as needed.    . pantoprazole (PROTONIX) 40 MG tablet Take 1 tablet (40 mg total) by mouth daily. 90 tablet 1  . prednisoLONE acetate (PRED FORTE) 1 % ophthalmic suspension INT 1 GTT INTO SURGICAL EYE QID AFTER SURGERY  1  . LORazepam (ATIVAN) 2 MG tablet TAKE 1 TABLET BY MOUTH AT BEDTIME AS NEEDED FOR ANXIETY 30 tablet 0   No facility-administered medications prior to visit.       Past Surgical History:  Procedure Laterality Date  . BREAST SURGERY    . CARDIOVERSION    . Cataract surgery    . CESAREAN SECTION  X 2  . COLON SURGERY    . COLONOSCOPY    . COLONOSCOPY N/A 01/13/2017   Procedure: COLONOSCOPY;  Surgeon: Gatha Mayer, MD;  Location: [facility];  Service: Endoscopy;  Laterality: N/A;  . ESOPHAGOGASTRODUODENOSCOPY N/A 01/13/2017   Procedure: ESOPHAGOGASTRODUODENOSCOPY (EGD);  Surgeon: Gatha Mayer, MD;  Location: [facility];  Service: Endoscopy;  Laterality: N/A;  . LAPAROSCOPIC PARTIAL COLECTOMY N/A 02/08/2017   Procedure: LAPAROSCOPIC PARTIAL COLECTOMY;  Surgeon: Leighton Ruff, MD;  Location: [facility];  Service: General;  Laterality: N/A;  . MASTECTOMY Right   .  MEDIASTINOSCOPY N/A 04/28/2015   Procedure: MEDIASTINOSCOPY;  Surgeon: [name], MD;  Location: [facility] OR;  Service: Thoracic;  Laterality: N/A;  . TONSILLECTOMY        Past Medical History:  Diagnosis Date  . AF (atrial fibrillation) ([place])   . Anxiety   . Arthritis    "thighs, hips, arms" (04/28/2015)  . Breast cancer ([place])    right mastectomy  . CAD (coronary artery disease)   . Dehydration 12/06/2016  . Dementia   . Depression   . Diverticulitis   . GERD (gastroesophageal reflux disease)   . HCAP (healthcare-associated pneumonia) 09/15/2015  . Hyperlipidemia   . Hypertension   . Non-small cell carcinoma of lung, stage 3 ([place]) 04/30/2015  . Prediabetes   . Seasonal allergies   . Shortness of breath dyspnea   . Sleep apnea     dx'd 10/2014,refused cpap machine  . Urinary frequency   . UTI (urinary tract infection) 06/06/2015      Review of Systems  Constitutional: Negative for chills and fever.  HENT: Negative for congestion.   Respiratory: Positive for cough, chest tightness and shortness of breath. Negative for wheezing.   Cardiovascular: Negative for chest pain, palpitations and  leg swelling.      Objective:    BP 140/78 (BP Location: Left Arm, Patient Position: Sitting, Cuff Size: Large)   Pulse 88   Temp 98.5 F (36.9 C) (Oral)   Resp 18   Ht '5\' 6"'  (1.676 m)   Wt 181 lb (82.1 kg)   LMP  (LMP Unknown)   SpO2 98%   BMI 29.21 kg/m  Nursing note and vital signs reviewed.  Physical Exam  Constitutional: She is oriented to person, place, and time. She appears well-developed and well-nourished. She appears ill. No distress.  Cardiovascular: Normal rate, normal heart sounds and intact distal pulses.  An irregularly irregular rhythm present. Exam reveals no gallop and no friction rub.   No murmur heard. Pulmonary/Chest: Effort normal and breath sounds normal. No respiratory distress. She has no wheezes. She has no rales. She exhibits no tenderness.  Neurological:  She is alert and oriented to person, place, and time.  Skin: Skin is warm and dry.  Psychiatric: She has a normal mood and affect. Her behavior is normal. Judgment and thought content normal.       Assessment & Plan:   Problem List Items Addressed This Visit      Cardiovascular and Mediastinum   Paroxysmal a-fib ([place])    Atrial fibrillation noted on physical exam. Continue current dosage of L Chris for anticoagulation. Rate appears to be adequately controlled with addition of digoxin. Continue scheduled follow-up with changes per cardiology.        Respiratory   Pleural effusion - Primary    Pleural effusions appear to be improved with continued shortness of breath and increased with exertion. She continues to wear oxygen at 2 L via nasal cannula pressurized. No adventitious lung sounds appreciated on physical exam. Continue current dosage of Lasix. Obtain complete metabolic profile.      Relevant Orders   Comp Met (CMET) (Completed)    Other Visit Diagnoses    Need for influenza vaccination       Relevant Orders   Flu vaccine HIGH DOSE PF (Fluzone High dose) (Completed)       I am having Ms. Knudsen maintain her albuterol, CALCIUM-VITAMIN D PO, NUTRITIONAL SUPPLEMENT, multivitamin with minerals, OXYGEN, oxyCODONE, fluticasone furoate-vilanterol, loratadine, atorvastatin, ofloxacin, prednisoLONE acetate, oxybutynin, escitalopram, apixaban, diltiazem, furosemide, buPROPion, pantoprazole, digoxin, and LORazepam.   Meds ordered this encounter  Medications  . LORazepam (ATIVAN) 2 MG tablet    Sig: TAKE 1 TABLET BY MOUTH AT BEDTIME AS NEEDED FOR ANXIETY    Dispense:  30 tablet    Refill:  1    Order Specific Question:   Supervising Provider    Answer:   Pricilla Holm A [[id number]]     Follow-up: Return in about 1 month (around 09/21/2017), or if symptoms worsen or fail to improve.  Mauricio Po, FNP

## 2017-08-22 NOTE — Assessment & Plan Note (Signed)
Pleural effusions appear to be improved with continued shortness of breath and increased with exertion. She continues to wear oxygen at 2 L via nasal cannula pressurized. No adventitious lung sounds appreciated on physical exam. Continue current dosage of Lasix. Obtain complete metabolic profile.

## 2017-08-22 NOTE — Assessment & Plan Note (Signed)
Atrial fibrillation noted on physical exam. Continue current dosage of L Chris for anticoagulation. Rate appears to be adequately controlled with addition of digoxin. Continue scheduled follow-up with changes per cardiology.

## 2017-08-22 NOTE — Patient Instructions (Signed)
Thank you for choosing [facility].  SUMMARY AND INSTRUCTIONS:  Follow with Dr. Sharlet Salina in 1 month.   We will check your blood work today.  Follow up with Dr. Debara Pickett.   Medication:  Your prescription(s) have been submitted to your pharmacy or been printed and provided for you. Please take as directed and contact our office if you believe you are having problem(s) with the medication(s) or have any questions.   Follow up:  If your symptoms worsen or fail to improve, please contact our office for further instruction, or in case of emergency go directly to the emergency room at the closest medical facility.

## 2017-08-24 ENCOUNTER — Emergency Department (HOSPITAL_COMMUNITY): Payer: Medicare HMO

## 2017-08-24 ENCOUNTER — Encounter (HOSPITAL_COMMUNITY): Payer: Self-pay | Admitting: Emergency Medicine

## 2017-08-24 ENCOUNTER — Ambulatory Visit (INDEPENDENT_AMBULATORY_CARE_PROVIDER_SITE_OTHER): Payer: Medicare HMO | Admitting: Internal Medicine

## 2017-08-24 ENCOUNTER — Encounter: Payer: Self-pay | Admitting: Internal Medicine

## 2017-08-24 ENCOUNTER — Inpatient Hospital Stay (HOSPITAL_COMMUNITY)
Admission: EM | Admit: 2017-08-24 | Discharge: 2017-08-27 | DRG: 191 | Disposition: A | Discharge status: Home / Self Care | Payer: Medicare HMO | Attending: Internal Medicine | Admitting: Internal Medicine

## 2017-08-24 VITALS — BP 114/64 | HR 106 | Ht 66.5 in | Wt 180.0 lb

## 2017-08-24 DIAGNOSIS — C3410 Malignant neoplasm of upper lobe, unspecified bronchus or lung: Secondary | ICD-10-CM | POA: Diagnosis present

## 2017-08-24 DIAGNOSIS — R05 Cough: Secondary | ICD-10-CM | POA: Diagnosis present

## 2017-08-24 DIAGNOSIS — J441 Chronic obstructive pulmonary disease with (acute) exacerbation: Secondary | ICD-10-CM | POA: Diagnosis not present

## 2017-08-24 DIAGNOSIS — Z9981 Dependence on supplemental oxygen: Secondary | ICD-10-CM

## 2017-08-24 DIAGNOSIS — K219 Gastro-esophageal reflux disease without esophagitis: Secondary | ICD-10-CM | POA: Diagnosis present

## 2017-08-24 DIAGNOSIS — M1990 Unspecified osteoarthritis, unspecified site: Secondary | ICD-10-CM | POA: Diagnosis present

## 2017-08-24 DIAGNOSIS — Z853 Personal history of malignant neoplasm of breast: Secondary | ICD-10-CM

## 2017-08-24 DIAGNOSIS — Z87891 Personal history of nicotine dependence: Secondary | ICD-10-CM

## 2017-08-24 DIAGNOSIS — R0600 Dyspnea, unspecified: Secondary | ICD-10-CM | POA: Diagnosis present

## 2017-08-24 DIAGNOSIS — Z818 Family history of other mental and behavioral disorders: Secondary | ICD-10-CM

## 2017-08-24 DIAGNOSIS — Z9011 Acquired absence of right breast and nipple: Secondary | ICD-10-CM

## 2017-08-24 DIAGNOSIS — J90 Pleural effusion, not elsewhere classified: Secondary | ICD-10-CM

## 2017-08-24 DIAGNOSIS — R0603 Acute respiratory distress: Secondary | ICD-10-CM | POA: Insufficient documentation

## 2017-08-24 DIAGNOSIS — I4891 Unspecified atrial fibrillation: Secondary | ICD-10-CM

## 2017-08-24 DIAGNOSIS — F0390 Unspecified dementia without behavioral disturbance: Secondary | ICD-10-CM | POA: Diagnosis present

## 2017-08-24 DIAGNOSIS — Z7951 Long term (current) use of inhaled steroids: Secondary | ICD-10-CM

## 2017-08-24 DIAGNOSIS — E278 Other specified disorders of adrenal gland: Secondary | ICD-10-CM | POA: Diagnosis present

## 2017-08-24 DIAGNOSIS — I509 Heart failure, unspecified: Secondary | ICD-10-CM | POA: Diagnosis present

## 2017-08-24 DIAGNOSIS — R064 Hyperventilation: Secondary | ICD-10-CM | POA: Insufficient documentation

## 2017-08-24 DIAGNOSIS — J810 Acute pulmonary edema: Secondary | ICD-10-CM | POA: Diagnosis not present

## 2017-08-24 DIAGNOSIS — R059 Cough, unspecified: Secondary | ICD-10-CM | POA: Diagnosis present

## 2017-08-24 DIAGNOSIS — Z79899 Other long term (current) drug therapy: Secondary | ICD-10-CM

## 2017-08-24 DIAGNOSIS — J9811 Atelectasis: Secondary | ICD-10-CM | POA: Diagnosis present

## 2017-08-24 DIAGNOSIS — Z9889 Other specified postprocedural states: Secondary | ICD-10-CM

## 2017-08-24 DIAGNOSIS — I2510 Atherosclerotic heart disease of native coronary artery without angina pectoris: Secondary | ICD-10-CM | POA: Diagnosis present

## 2017-08-24 DIAGNOSIS — Z9049 Acquired absence of other specified parts of digestive tract: Secondary | ICD-10-CM

## 2017-08-24 DIAGNOSIS — G4730 Sleep apnea, unspecified: Secondary | ICD-10-CM | POA: Diagnosis present

## 2017-08-24 DIAGNOSIS — Z85038 Personal history of other malignant neoplasm of large intestine: Secondary | ICD-10-CM

## 2017-08-24 DIAGNOSIS — I482 Chronic atrial fibrillation: Secondary | ICD-10-CM | POA: Diagnosis present

## 2017-08-24 DIAGNOSIS — C3411 Malignant neoplasm of upper lobe, right bronchus or lung: Secondary | ICD-10-CM | POA: Diagnosis present

## 2017-08-24 DIAGNOSIS — I110 Hypertensive heart disease with heart failure: Secondary | ICD-10-CM | POA: Diagnosis present

## 2017-08-24 LAB — CBC WITH DIFFERENTIAL/PLATELET
BASOS ABS: 0 10*3/uL (ref 0.0–0.1)
Basophils Relative: 0 %
Eosinophils Absolute: 0 10*3/uL (ref 0.0–0.7)
Eosinophils Relative: 0 %
HEMATOCRIT: 41.3 % (ref 36.0–46.0)
HEMOGLOBIN: 13.1 g/dL (ref 12.0–15.0)
LYMPHS PCT: 14 %
Lymphs Abs: 1.1 10*3/uL (ref 0.7–4.0)
MCH: 29.1 pg (ref 26.0–34.0)
MCHC: 31.7 g/dL (ref 30.0–36.0)
MCV: 91.8 fL (ref 78.0–100.0)
Monocytes Absolute: 0.6 10*3/uL (ref 0.1–1.0)
Monocytes Relative: 8 %
NEUTROS ABS: 5.9 10*3/uL (ref 1.7–7.7)
NEUTROS PCT: 78 %
Platelets: 348 10*3/uL (ref 150–400)
RBC: 4.5 MIL/uL (ref 3.87–5.11)
RDW: 14.7 % (ref 11.5–15.5)
WBC: 7.6 10*3/uL (ref 4.0–10.5)

## 2017-08-24 LAB — BRAIN NATRIURETIC PEPTIDE: B NATRIURETIC PEPTIDE 5: 196.8 pg/mL — AB (ref 0.0–100.0)

## 2017-08-24 LAB — BASIC METABOLIC PANEL
ANION GAP: 12 (ref 5–15)
BUN: 14 mg/dL (ref 6–20)
CO2: 24 mmol/L (ref 22–32)
Calcium: 9.2 mg/dL (ref 8.9–10.3)
Chloride: 102 mmol/L (ref 101–111)
Creatinine, Ser: 1.06 mg/dL — ABNORMAL HIGH (ref 0.44–1.00)
GFR, EST AFRICAN AMERICAN: 56 mL/min — AB (ref >60)
GFR, EST NON AFRICAN AMERICAN: 48 mL/min — AB (ref >60)
GLUCOSE: 98 mg/dL (ref 65–99)
POTASSIUM: 4.1 mmol/L (ref 3.5–5.1)
Sodium: 138 mmol/L (ref 135–145)

## 2017-08-24 LAB — TROPONIN I: Troponin I: <0.03 ng/mL (ref <0.03)

## 2017-08-24 MED ORDER — IOPAMIDOL (ISOVUE-370) INJECTION 76%
INTRAVENOUS | Status: AC
  Administered 2017-08-24: 100 mL
  Filled 2017-08-24: qty 100

## 2017-08-24 MED ORDER — FUROSEMIDE 10 MG/ML IJ SOLN
40.0000 mg | Freq: Once | INTRAMUSCULAR | Status: AC
  Administered 2017-08-24: 40 mg via INTRAVENOUS
  Filled 2017-08-24: qty 4

## 2017-08-24 MED ORDER — MORPHINE SULFATE (PF) 4 MG/ML IV SOLN
4.0000 mg | Freq: Once | INTRAVENOUS | Status: AC
  Administered 2017-08-24: 4 mg via INTRAVENOUS
  Filled 2017-08-24: qty 1

## 2017-08-24 NOTE — ED Notes (Signed)
Lab contacted for blood results, advised that blood is not showing processed but they stated they would check with the processor when she returned to see if she knew anything about pts labs.

## 2017-08-24 NOTE — ED Notes (Addendum)
Patient transported to x-ray. ?

## 2017-08-24 NOTE — ED Notes (Signed)
ED Provider at bedside. 

## 2017-08-24 NOTE — ED Triage Notes (Addendum)
Pt had recent admission at [facility], went to [facility] today for f/u was tachypneic with O2 sat of 80 on normal 2L, pt placed on 4L O2, improved to 98%. Pt denies chest pain, endorses some rib pain with inspiration. Has hx of stage 4 lung cancer, a/ox4, resp labored at rest, skin pale and dry.

## 2017-08-24 NOTE — Progress Notes (Signed)
OFFICE NOTE  Chief Complaint:  Short of breath, chest pain  Primary Care Physician: Hoyt Koch, MD  HPI:  Pamela Mcguire is a pleasant 82 year old female who recently moved from the [place] area back to [place]. She is here today to establish cardiology care. She apparently was recently hospitalized and quite ill in the intensive care unit. She was suffering from diverticulitis and went into atrial fibrillation. She does not recall many of the events however does report that she was cardioverted and started on Rythmol. She must is seen a cardiologist in the hospital however did not have cardiology followup. She has been maintained on this medication and is in sinus rhythm today. She was not started on anticoagulation which is very unusual. She does have a history of hypertension, age greater than 70 and dyslipidemia. Her CHADSVASC score is 2, therefore, long-term anticoagulation is indicated. There are also some discrepancies in her medications. It appears that she has 3 different cholesterol medications.  She denies any chest pain with exertion but does get short of breath. She denies any palpitations, presyncope or syncopal episodes. She reports that she never had a stress test. She denies suffering an MI in the hospital.  Pamela Mcguire returns today for followup. I'm pleased to report her nuclear stress test was negative for ischemia. She has since started Eliquis and is tolerating it well without bleeding problems. We simplified her cholesterol medications and she is currently on Vytorin. She does report poor sleep at night and napping during the day. She says she has to urinate several times at night. She is noted to have a history of snoring but is unclear whether she has any true apnea. She often has trouble falling asleep as well as staying asleep.  I saw Pamela Mcguire back in the office today. She is reporting some mild positional dizziness. She also reports of chest pain  when laying down at night. Although she recently had a stress test as above which was negative. She continues to be fatigued throughout the day. She did undergo a sleep study which was markedly abnormal demonstrating an AHI of close to 50. She is scheduled to have a CPAP titration on March 28. I think this will make a significant difference in her symptoms. She reports no recurrence of her atrial fibrillation and is off Rythmol.  Pamela Mcguire returns today in the office. She denies any recurrent A. fib and is in a sinus rhythm today. There are occasional PVCs. She is actually off of all of her blood pressure medicine due to hypotension. She's had significant weight loss. She's been diagnosed with a lung cancer and is undergone chemotherapy. She apparently said all the chemotherapy that she can tolerate however the tumor is not resolved, but has shrunk. She short of breath most likely related to this. She continues on Eliquis without any bleeding problems. She is intolerant to CPAP despite her severe obstructive sleep apnea due to anxiety. She was previously on Xanax however is out of that today. I advised her that I did not prescribe benzodiazepine's or narcotics and that she should seek out her primary care provider for this.  I had the pleasure of seeing Pamela Mcguire back today in the office. Overall she reports she is doing better and has completed her chemotherapy. She does get some mild shortness of breath which is unchanged. She is describing some chest discomfort however it's mostly when she lays down at night and not with exertion. This is  not worsening is been a problem for several months. She sees to be tolerating Eliquis without bleeding problems. She is on low-dose carvedilol for rate control after she was taken off of Rythmol. Blood pressure is a little low today however in general she has had blood pressures in the 120s and is been asymptomatic. Daughter is concerned about the cost of Vytorin since  she is on Medicare with a supplemental. Previously she had taken Lipitor without problems. Also reportedly she is taking ibuprofen with diphenhydramine (Advil PM) for sleep. I advised her that this is not a good idea based on the fact that ibuprofen may increase her bleeding risk on Eliquis.  08/24/2017  Pamela Mcguire returns today for follow-up. She reports significant shortness of breath. Earlier this week she had a flu shot she's felt unwell for the last couple days. She's had some progressive dyspnea, chills and mildly productive cough. Today she is noted to be markedly tachypnea can the office. Respiratory rate is around 50. She notes some tingling in her fingers and a feeling that she may pass out. Oxygen saturation was 98% on supplemental oxygen. Blood pressure was stable. She reported chest discomfort. EKG shows A. fib with RVR rate of 106.  PMHx:  Past Medical History:  Diagnosis Date  . AF (atrial fibrillation) ([place])   . Anxiety   . Arthritis    "thighs, hips, arms" (04/28/2015)  . Breast cancer ([place])    right mastectomy  . CAD (coronary artery disease)   . Dehydration 12/06/2016  . Dementia   . Depression   . Diverticulitis   . GERD (gastroesophageal reflux disease)   . HCAP (healthcare-associated pneumonia) 09/15/2015  . Hyperlipidemia   . Hypertension   . Non-small cell carcinoma of lung, stage 3 ([place]) 04/30/2015  . Prediabetes   . Seasonal allergies   . Shortness of breath dyspnea   . Sleep apnea     dx'd 10/2014,refused cpap machine  . Urinary frequency   . UTI (urinary tract infection) 06/06/2015    Past Surgical History:  Procedure Laterality Date  . BREAST SURGERY    . CARDIOVERSION    . Cataract surgery    . CESAREAN SECTION  X 2  . COLON SURGERY    . COLONOSCOPY    . COLONOSCOPY N/A 01/13/2017   Procedure: COLONOSCOPY;  Surgeon: Gatha Mayer, MD;  Location: [facility];  Service: Endoscopy;  Laterality: N/A;  . ESOPHAGOGASTRODUODENOSCOPY N/A 01/13/2017   Procedure:  ESOPHAGOGASTRODUODENOSCOPY (EGD);  Surgeon: Gatha Mayer, MD;  Location: [facility];  Service: Endoscopy;  Laterality: N/A;  . LAPAROSCOPIC PARTIAL COLECTOMY N/A 02/08/2017   Procedure: LAPAROSCOPIC PARTIAL COLECTOMY;  Surgeon: Leighton Ruff, MD;  Location: [facility]S;  Service: General;  Laterality: N/A;  . MASTECTOMY Right   . MEDIASTINOSCOPY N/A 04/28/2015   Procedure: MEDIASTINOSCOPY;  Surgeon: [name], MD;  Location: [facility] OR;  Service: Thoracic;  Laterality: N/A;  . TONSILLECTOMY      FAMHx:  Family History  Problem Relation Age of Onset  . Mental illness Mother     SOCHx:   reports that she quit smoking about 24 years ago. Her smoking use included Cigarettes. She has a 35.00 pack-year smoking history. She has never used smokeless tobacco. She reports that she drinks about 5.4 oz of alcohol per week . She reports that she does not use drugs.  ALLERGIES:  No Known Allergies  ROS: Pertinent items noted in HPI and remainder of comprehensive ROS otherwise negative.  HOME MEDS:  Current Outpatient Prescriptions  Medication Sig Dispense Refill  . albuterol (PROVENTIL HFA;VENTOLIN HFA) 108 (90 Base) MCG/ACT inhaler Inhale 1 puff into the lungs every 6 (six) hours as needed for wheezing or shortness of breath. Reported on 06/01/2016 18 g 1  . apixaban (ELIQUIS) 5 MG TABS tablet Take 1 tablet by mouth twice daily. Appoint,ent needed for further refills - call cardiologist to schedule follow up visit- 60 tablet 0  . atorvastatin (LIPITOR) 40 MG tablet TAKE 1 TABLET(40 MG) BY MOUTH DAILY 90 tablet 0  . buPROPion (WELLBUTRIN SR) 100 MG 12 hr tablet TAKE 1 TABLET(100 MG) BY MOUTH DAILY 90 tablet 0  . CALCIUM-VITAMIN D PO Take 1 tablet by mouth daily.    . digoxin (DIGOX) 0.125 MG tablet Take 1 tablet (0.125 mg total) by mouth daily. 30 tablet 5  . diltiazem (CARDIZEM CD) 240 MG 24 hr capsule Take 1 capsule (240 mg total) by mouth daily. 30 capsule 0  . escitalopram (LEXAPRO) 10 MG tablet  TAKE 1 TABLET(10 MG) BY MOUTH DAILY. FOLLOW-UP APPOINTMENT IS DUE 30 tablet 0  . fluticasone furoate-vilanterol (BREO ELLIPTA) 100-25 MCG/INH AEPB Inhale 1 puff into the lungs daily. 28 each 3  . furosemide (LASIX) 20 MG tablet Take 1 tablet (20 mg total) by mouth daily. 30 tablet 0  . loratadine (CLARITIN) 10 MG tablet TAKE 1 TABLET BY MOUTH AT BEDTIME 90 tablet 0  . LORazepam (ATIVAN) 2 MG tablet TAKE 1 TABLET BY MOUTH AT BEDTIME AS NEEDED FOR ANXIETY 30 tablet 1  . Multiple Vitamins-Minerals (MULTIVITAMIN WITH MINERALS) tablet Take 1 tablet by mouth daily.    Marland Kitchen NUTRITIONAL SUPPLEMENT LIQD Take 120 mLs by mouth 3 (three) times daily. MedPass    . ofloxacin (OCUFLOX) 0.3 % ophthalmic solution   1  . oxybutynin (DITROPAN) 5 MG tablet TAKE 1 TABLET BY MOUTH TWICE DAILY 180 tablet 0  . oxyCODONE (OXY IR/ROXICODONE) 5 MG immediate release tablet Take 0.5-1 tablets (2.5-5 mg total) by mouth every 6 (six) hours as needed for severe pain. 30 tablet 0  . OXYGEN Inhale 2 L into the lungs as needed.    . pantoprazole (PROTONIX) 40 MG tablet Take 1 tablet (40 mg total) by mouth daily. 90 tablet 1   No current facility-administered medications for this visit.     LABS/IMAGING: No results found for this or any previous visit (from the past 48 hour(s)). No results found.  VITALS: BP 114/64   Pulse (!) 106   Ht 5' 6.5" (1.689 m)   Wt 180 lb (81.6 kg)   LMP  (LMP Unknown)   BMI 28.62 kg/m   EXAM: General appearance: alert, moderate distress, moderately obese and pale Neck: no carotid bruit and no JVD Lungs: diminished breath sounds RLL and RML Heart: irregularly irregular rhythm and Tachycardic Abdomen: soft, non-tender; bowel sounds normal; no masses,  no organomegaly Extremities: extremities normal, atraumatic, no cyanosis or edema Pulses: Thready Skin: Pale, dry Neurologic: Mental status: Alert, oriented, thought content appropriate Psych: Anxious, tachypnea, respiratory  distress  EKG: A. fib with RVR 106 I personally reviewed  ASSESSMENT: 1. Acute respiratory distress-possible pleural effusion or pneumonia (RLL) 2. A. fib with RVR 3. Hypertension- off of medications due to hypotension 4. Dyslipidemia on Vytorin 5. Dyspnea on exertion - newly diagnosed lung cancer  6. Relative bradycardia-fatigue 7. Insomnia/somnolence - severe sleep apnea, intolerant to CPAP due to anxiety  PLAN: 1.   Mrs. Bonine has significant shortness of breath, tachypnea and signs of  hyperventilation with a peripheral numbness and tingling. Her story rate is close to 50. She is in A. fib with RVR and reports chest tightness. Symptoms have worsened over the past couple of days. She is using accessory muscles to breathe and I'm concerned may tire out with continued breathing. I'm recommending that she go by ambulance to the emergency department for evaluation of her breathing. She'll need a chest x-ray and likely an ABG. She is on supplemental oxygen here in blood pressure is stable with oxygen saturation 98% on room air. I informed her daughter this is with her and she is in agreement.  Pixie Casino, MD, [facility] Attending Cardiologist [facility] 08/24/2017, 4:06 PM

## 2017-08-24 NOTE — H&P (Signed)
TRH H&P   Patient Demographics:    Pamela Mcguire, is a 82 y.o. female  MRN: [id number]   DOB - Apr 17, 1935  Admit Date - 08/24/2017  Outpatient Primary MD for the patient is Hoyt Koch, MD  Referring MD/NP/PA: Regenia Skeeter  Outpatient Specialists:  Mali Hilty (cardiology) Curt Bears (oncology)  Patient coming from: home=> Hilty's office=> ER  Chief Complaint  Patient presents with  . Shortness of Breath      HPI:    Pamela Mcguire  is a 82 y.o. female, w nonsmall cell carcinoma lung stage3, Pafib (chadsvasc2=4), apparently c/o increase dyspnea this evening.  + cough with yellow sputum.  Slight chest pain w cough.  Pt denies fever, chills,palp n/v, diarrhea, brbpr.  Pt presented to ED for evaluation.   In ED.  CTA chest  IMPRESSION: 1. No pulmonary embolus identified. 2. Interval decrease in pleural effusions with small residual right-sided pleural effusion. 3. Interstitial pulmonary edema, decreased compared with prior CTA. 4. Radiation fibrosis changes/treated neoplasm in the right upper lobe extending into the right hilum are stable from prior CTA. 5. Stable right lower and upper paratracheal as well as sub- carinal lymphadenopathy, attention at follow-up recommended. Decreased size of prevascular lymph nodes probably due to decrease in pulmonary vascular congestion. 6. Several small peripheral pulmonary nodules and 15 mm right lower lobe pulmonary nodule are stable. 7. Stable subcentimeter left adrenal nodule. 8. Stable moderate L1 compression deformity. 9. Moderate coronary artery and aortic calcific atherosclerosis.  Wbc 7.6, Hgb 13.1, Trop <0.03,  Afib at 100, nl axis.  Pt will be admitted for evaluation of dyspnea     Review of systems:    In addition to the HPI above,  No Fever-chills, No Headache, No changes with Vision or hearing, No problems  swallowing food or Liquids,  No Abdominal pain, No Nausea or Vommitting, Bowel movements are regular, No Blood in stool or Urine, No dysuria, No new skin rashes or bruises, No new joints pains-aches,  No new weakness, tingling, numbness in any extremity, No recent weight gain or loss, No polyuria, polydypsia or polyphagia, No significant Mental Stressors.  A full 10 point Review of Systems was done, except as stated above, all other Review of Systems were negative.   With Past History of the following :    Past Medical History:  Diagnosis Date  . AF (atrial fibrillation) ([place])   . Anxiety   . Arthritis    "thighs, hips, arms" (04/28/2015)  . Breast cancer ([place])    right mastectomy  . CAD (coronary artery disease)   . Dehydration 12/06/2016  . Dementia   . Depression   . Diverticulitis   . GERD (gastroesophageal reflux disease)   . HCAP (healthcare-associated pneumonia) 09/15/2015  . Hyperlipidemia   . Hypertension   . Non-small cell carcinoma of lung, stage 3 ([place]) 04/30/2015  . Prediabetes   .  Seasonal allergies   . Shortness of breath dyspnea   . Sleep apnea     dx'd 10/2014,refused cpap machine  . Urinary frequency   . UTI (urinary tract infection) 06/06/2015      Past Surgical History:  Procedure Laterality Date  . BREAST SURGERY    . CARDIOVERSION    . Cataract surgery    . CESAREAN SECTION  X 2  . COLON SURGERY    . COLONOSCOPY    . COLONOSCOPY N/A 01/13/2017   Procedure: COLONOSCOPY;  Surgeon: Gatha Mayer, MD;  Location: [facility];  Service: Endoscopy;  Laterality: N/A;  . ESOPHAGOGASTRODUODENOSCOPY N/A 01/13/2017   Procedure: ESOPHAGOGASTRODUODENOSCOPY (EGD);  Surgeon: Gatha Mayer, MD;  Location: [facility];  Service: Endoscopy;  Laterality: N/A;  . LAPAROSCOPIC PARTIAL COLECTOMY N/A 02/08/2017   Procedure: LAPAROSCOPIC PARTIAL COLECTOMY;  Surgeon: Leighton Ruff, MD;  Location: [facility] ORS;  Service: General;  Laterality: N/A;  . MASTECTOMY Right   .  MEDIASTINOSCOPY N/A 04/28/2015   Procedure: MEDIASTINOSCOPY;  Surgeon: [name], MD;  Location: [facility] OR;  Service: Thoracic;  Laterality: N/A;  . TONSILLECTOMY        Social History:     Social History  Substance Use Topics  . Smoking status: Former Smoker    Packs/day: 1.00    Years: 35.00    Types: Cigarettes    Quit date: 11/28/1992  . Smokeless tobacco: Never Used     Comment: "stopped smoking at age 6"  . Alcohol use 5.4 oz/week    9 Glasses of wine per week     Comment: 04/28/2015 "drink 3, 6oz,  glasses of wine on Sat & Sun"     Lives - at home  Mobility - walks by self   Family History :     Family History  Problem Relation Age of Onset  . Mental illness Mother       Home Medications:   Prior to Admission medications   Medication Sig Start Date End Date Taking? Authorizing Provider  albuterol (PROVENTIL HFA;VENTOLIN HFA) 108 (90 Base) MCG/ACT inhaler Inhale 1 puff into the lungs every 6 (six) hours as needed for wheezing or shortness of breath. Reported on 06/01/2016 11/15/16  Yes Golden Circle, FNP  apixaban (ELIQUIS) 5 MG TABS tablet Take 1 tablet by mouth twice daily. Appoint,ent needed for further refills - call cardiologist to schedule follow up visit- 08/01/17  Yes Hilty, Nadean Corwin, MD  atorvastatin (LIPITOR) 40 MG tablet TAKE 1 TABLET(40 MG) BY MOUTH DAILY Patient taking differently: TAKE 1 TABLET(40 MG) in the evening 07/03/17  Yes Hilty, Nadean Corwin, MD  buPROPion (WELLBUTRIN SR) 100 MG 12 hr tablet TAKE 1 TABLET(100 MG) BY MOUTH DAILY 08/15/17  Yes Golden Circle, FNP  CALCIUM-VITAMIN D PO Take 1 tablet by mouth daily.   Yes [provider]  digoxin (DIGOX) 0.125 MG tablet Take 1 tablet (0.125 mg total) by mouth daily. 08/16/17  Yes Almyra Deforest, PA  diltiazem (CARDIZEM CD) 240 MG 24 hr capsule Take 1 capsule (240 mg total) by mouth daily. 08/10/17  Yes Dessa Phi Chahn-Yang, DO  escitalopram (LEXAPRO) 10 MG tablet TAKE 1 TABLET(10 MG) BY MOUTH  DAILY. FOLLOW-UP APPOINTMENT IS DUE 07/24/17  Yes Golden Circle, FNP  ferrous sulfate 325 (65 FE) MG tablet Take 325 mg by mouth daily with breakfast.   Yes [provider]  fluticasone furoate-vilanterol (BREO ELLIPTA) 100-25 MCG/INH AEPB Inhale 1 puff into the lungs daily. 05/17/17  Yes Golden Circle, FNP  furosemide (LASIX) 20 MG tablet Take 1 tablet (20 mg total) by mouth daily. 08/10/17  Yes Dessa Phi Chahn-Yang, DO  loratadine (CLARITIN) 10 MG tablet TAKE 1 TABLET BY MOUTH AT BEDTIME 06/12/17  Yes Calone, Ples Specter, FNP  LORazepam (ATIVAN) 2 MG tablet TAKE 1 TABLET BY MOUTH AT BEDTIME AS NEEDED FOR ANXIETY Patient taking differently: Take 1 mg by mouth every 6 (six) hours as needed for sleep. TAKE 1 TABLET BY MOUTH AT BEDTIME AS NEEDED FOR ANXIETY 08/22/17  Yes Golden Circle, FNP  Multiple Vitamins-Minerals (MULTIVITAMIN WITH MINERALS) tablet Take 1 tablet by mouth daily. gummie   Yes [provider]  NUTRITIONAL SUPPLEMENT LIQD Take 120 mLs by mouth 3 (three) times daily. MedPass   Yes [provider]  oxybutynin (DITROPAN) 5 MG tablet TAKE 1 TABLET BY MOUTH TWICE DAILY Patient taking differently: Take 5 mg by mouth 2 (two) times daily. TAKE 1 TABLET BY MOUTH TWICE DAILY 07/14/17  Yes Golden Circle, FNP  oxyCODONE (OXY IR/ROXICODONE) 5 MG immediate release tablet Take 0.5-1 tablets (2.5-5 mg total) by mouth every 6 (six) hours as needed for severe pain. 04/18/17  Yes Golden Circle, FNP  OXYGEN Inhale 2 L into the lungs as needed.   Yes [provider]  pantoprazole (PROTONIX) 40 MG tablet Take 1 tablet (40 mg total) by mouth daily. 08/15/17  Yes Golden Circle, FNP     Allergies:    No Known Allergies   Physical Exam:   Vitals  Blood pressure 119/69, pulse 92, temperature 97.6 F (36.4 C), temperature source Oral, resp. rate (!) 22, height 5' 6.5" (1.689 m), weight 81.6 kg (180 lb), SpO2 98 %.   1. General  lying in bed in  NAD,    2. Normal affect and insight, Not Suicidal or Homicidal, Awake Alert, Oriented X 3.  3. No F.N deficits, ALL C.Nerves Intact, Strength 5/5 all 4 extremities, Sensation intact all 4 extremities, Plantars down going.  4. Ears and Eyes appear Normal, Conjunctivae clear, PERRLA. Moist Oral Mucosa.  5. Supple Neck, No JVD, No cervical lymphadenopathy appriciated, No Carotid Bruits.  6. Symmetrical Chest wall movement, Good air movement bilaterally, slight basilar crackles. No wheezing  7. RRR, No Gallops, Rubs or Murmurs, No Parasternal Heave.  8. Positive Bowel Sounds, Abdomen Soft, No tenderness, No organomegaly appriciated,No rebound -guarding or rigidity.  9.  No Cyanosis, Normal Skin Turgor, No Skin Rash or Bruise.  10. Good muscle tone,  joints appear normal , no effusions, Normal ROM.  11. No Palpable Lymph Nodes in Neck or Axillae     Data Review:    CBC  Recent Labs Lab 08/24/17 1805  WBC 7.6  HGB 13.1  HCT 41.3  PLT 348  MCV 91.8  MCH 29.1  MCHC 31.7  RDW 14.7  LYMPHSABS 1.1  MONOABS 0.6  EOSABS 0.0  BASOSABS 0.0   ------------------------------------------------------------------------------------------------------------------  Chemistries   Recent Labs Lab 08/22/17 1531 08/24/17 1805  NA 139 138  K 4.2 4.1  CL 101 102  CO2 33* 24  GLUCOSE 70 98  BUN 20 14  CREATININE 0.89 1.06*  CALCIUM 9.8 9.2  AST 14  --   ALT 11  --   ALKPHOS 55  --   BILITOT 0.6  --    ------------------------------------------------------------------------------------------------------------------ estimated creatinine clearance is 46 mL/min (A) (by C-G formula based on SCr of 1.06 mg/dL (H)). ------------------------------------------------------------------------------------------------------------------ No results for input(s): TSH, T4TOTAL,  T3FREE, THYROIDAB in the last 72 hours.  Invalid input(s): FREET3  Coagulation profile No results for input(s): INR,  PROTIME in the last 168 hours. ------------------------------------------------------------------------------------------------------------------- No results for input(s): DDIMER in the last 72 hours. -------------------------------------------------------------------------------------------------------------------  Cardiac Enzymes  Recent Labs Lab 08/24/17 1805  TROPONINI <0.03   ------------------------------------------------------------------------------------------------------------------    Component Value Date/Time   BNP 196.8 (H) 08/24/2017 1805     ---------------------------------------------------------------------------------------------------------------  Urinalysis    Component Value Date/Time   COLORURINE YELLOW 08/07/2017 1213   APPEARANCEUR CLEAR 08/07/2017 1213   LABSPEC 1.012 08/07/2017 1213   LABSPEC 1.010 06/02/2015 1706   PHURINE 6.0 08/07/2017 1213   GLUCOSEU NEGATIVE 08/07/2017 [place] 07/10/2017 1633   GLUCOSEU Negative 06/02/2015 1706   HGBUR NEGATIVE 08/07/2017 1213   BILIRUBINUR NEGATIVE 08/07/2017 1213   BILIRUBINUR 3+      4mg /dl 09/08/2016 1133   KETONESUR NEGATIVE 08/07/2017 1213   PROTEINUR NEGATIVE 08/07/2017 1213   UROBILINOGEN 0.2 07/10/2017 1633   UROBILINOGEN 0.2 06/02/2015 1706   NITRITE NEGATIVE 08/07/2017 1213   LEUKOCYTESUR TRACE (A) 08/07/2017 1213   LEUKOCYTESUR Moderate 06/02/2015 1706    ----------------------------------------------------------------------------------------------------------------   Imaging Results:    Dg Chest 2 View  Result Date: 08/24/2017 CLINICAL DATA:  Chest pain and cough EXAM: CHEST  2 VIEW COMPARISON:  08/09/2017 FINDINGS: The heart size and mediastinal contours are within normal limits. Atherosclerotic calcifications are noted at the aortic arch. There is volume loss of the right lung with tenting of the inferior pulmonary ligament and scarring in the right upper lobe. This  appears chronic. Fine interstitial prominence is seen bilaterally of the lungs without pneumonic consolidation. Stable small right posterior pleural effusion. Axillary clips are seen on the right. No acute nor suspicious osseous abnormalities. The visualized skeletal structures are unremarkable. IMPRESSION: 1. Chronic volume loss on the right with tenting of the inferior pulmonary ligament and scarring at the right lung apex. 2. Fine interstitial prominence of the lungs may reflect fibrosis. 3. Chronic stable small posterior pleural effusion. Electronically Signed   By: Ashley Royalty M.D.   On: 08/24/2017 19:15   Ct Angio Chest Pe W/cm &/or Wo Cm  Result Date: 08/24/2017 CLINICAL DATA:  82 y/o F; tachypnea and hypoxia, suspect pulmonary embolus. History of lung cancer post radiation treatment. EXAM: CT ANGIOGRAPHY CHEST WITH CONTRAST TECHNIQUE: Multidetector CT imaging of the chest was performed using the standard protocol during bolus administration of intravenous contrast. Multiplanar CT image reconstructions and MIPs were obtained to evaluate the vascular anatomy. CONTRAST:  69 cc Isovue 370 COMPARISON:  08/05/2017 CTA chest.  12/22/2016 PET-CT. FINDINGS: Cardiovascular: Normal heart size. No pericardial effusion. Moderate coronary artery calcification. Aortic valvular and mitral annular calcifications can be associated with valvular dysfunction. Normal caliber thoracic aorta with moderate calcific atherosclerosis. Satisfactory opacification of the pulmonary arteries. No pulmonary embolus identified. Mediastinum/Nodes: Stable right lower paratracheal, right upper peritracheal, and carinal lymphadenopathy. Decreased size of prevascular lymph nodes. Lungs/Pleura: Diminution in pleural effusions with small residual right-sided pleural effusion. Interlobular septal thickening is present although decreased when compared to prior CT of the chest compatible with interstitial edema. Additionally, there are fine  reticular opacities at periphery of the lungs likely representing component of pulmonary fibrosis. There are numerous small (3-5 mm) peripheral pulmonary nodules greatest in the lung bases and there is a stable nodule in the right lower lobe measuring 15 x 8 mm (series 7, image 63) which demonstrated FDG uptake on prior PET-CT, attention at follow-up is  recommended. The dense radiation fibrosis in the right upper lobe extending into the right hilum with atelectatic lung, vascular pruning, in confluent soft tissue. Evaluation for underlying neoplasm is best assessed with PET-CT. Upper Abdomen: Stable 9 mm left adrenal nodule.  Cholecystectomy. Musculoskeletal: Stable moderate L1 compression deformity. No new acute osseous abnormality is identified. Review of the MIP images confirms the above findings. IMPRESSION: 1. No pulmonary embolus identified. 2. Interval decrease in pleural effusions with small residual right-sided pleural effusion. 3. Interstitial pulmonary edema, decreased compared with prior CTA. 4. Radiation fibrosis changes/treated neoplasm in the right upper lobe extending into the right hilum are stable from prior CTA. 5. Stable right lower and upper paratracheal as well as sub- carinal lymphadenopathy, attention at follow-up recommended. Decreased size of prevascular lymph nodes probably due to decrease in pulmonary vascular congestion. 6. Several small peripheral pulmonary nodules and 15 mm right lower lobe pulmonary nodule are stable. 7. Stable subcentimeter left adrenal nodule. 8. Stable moderate L1 compression deformity. 9. Moderate coronary artery and aortic calcific atherosclerosis. Electronically Signed   By: Kristine Garbe M.D.   On: 08/24/2017 22:37      Assessment & Plan:    Principal Problem:   Dyspnea Active Problems:   Adenocarcinoma of upper lobe of right lung   Atrial fibrillation with RVR (HCC)   Dyspnea secondary to afib w rvr, chf Lasix 20mg  iv qday Albuterol  prn  Bronchitis zithromax 500mg  iv qday  Afib with RVR Cont eliquis, cont cardizem CD, cont digoxin  CAD Cont lipitor   DVT Prophylaxis Eliquis, SCDs  AM Labs Ordered, also please review Full Orders  Family Communication: Admission, patients condition and plan of care including tests being ordered have been discussed with the patient  who indicate understanding and agree with the plan and Code Status.  Code Status FULL CODE  Likely DC to  home  Condition GUARDED    Consults called: none  Admission status: observation   Time spent in minutes : 45    Jani Gravel M.D on 08/24/2017 at 11:17 PM  Between 7am to 7pm - Pager - [phone] . After 7pm go to [email or web address] - password [facility]  [facility] - Office  [phone]

## 2017-08-24 NOTE — ED Provider Notes (Signed)
[facility] DEPT Provider Note   CSN: [id number] Arrival date & time: 08/24/17  1705     History   Chief Complaint Chief Complaint  Patient presents with  . Shortness of Breath    HPI Pamela Mcguire is a 82 y.o. female.  HPI  82 year old female with a history of atrial fibrillation, breast cancer, stage IV non-small cell lung cancer presents with shortness of breath and chest pain. She was recently admitted and discharged earlier this month for a new pleural effusion. This was transudative. 1.3 L was pulled off by IR. The patient states that 2 days ago she received a flu shot. That evening she had a couple episodes of vomiting. Since then no further vomiting. However now is having midsternal chest pain whenever she takes a deep breath. If she breathes normally it is not as bad. However she is also feeling short of breath. She's had a cough with productive sputum of yellow as well as specks of red for about one week. No fevers. No leg swelling or abdominal pain. She's not sure if this is what it felt like when she had the effusions. She was at her cardiologist office today and was sent here for further evaluation because of her tachypnea as well as her oxygen being at 80 on her chronic 2 L O2.  Past Medical History:  Diagnosis Date  . AF (atrial fibrillation) ([place])   . Anxiety   . Arthritis    "thighs, hips, arms" (04/28/2015)  . Breast cancer ([place])    right mastectomy  . CAD (coronary artery disease)   . Dehydration 12/06/2016  . Dementia   . Depression   . Diverticulitis   . GERD (gastroesophageal reflux disease)   . HCAP (healthcare-associated pneumonia) 09/15/2015  . Hyperlipidemia   . Hypertension   . Non-small cell carcinoma of lung, stage 3 ([place]) 04/30/2015  . Prediabetes   . Seasonal allergies   . Shortness of breath dyspnea   . Sleep apnea     dx'd 10/2014,refused cpap machine  . Urinary frequency   . UTI (urinary tract infection) 06/06/2015    Patient Active Problem  List   Diagnosis Date Noted  . Hyperventilation 08/24/2017  . Respiratory distress 08/24/2017  . Dyspnea 08/24/2017  . Pleural effusion 08/22/2017  . Atrial fibrillation with RVR ([place]) 08/05/2017  . Medicare annual wellness visit, subsequent 07/10/2017  . Encounter for general adult medical examination with abnormal findings 07/10/2017  . Insomnia due to medical condition 04/18/2017  . Urinary frequency 04/18/2017  . Blurred vision 04/18/2017  . Cancer of ascending colon s/p lap colectomy 02/08/2017 02/08/2017  . Iron deficiency anemia   . Fundic gland polyps of stomach, benign   . Benign neoplasm of transverse colon   . Benign neoplasm of descending colon   . Chronic anticoagulation 12/22/2016  . Dehydration 12/06/2016  . Rash and nonspecific skin eruption 06/21/2016  . Cough 06/21/2016  . Fracture of L1 vertebra ([place]) 10/19/2015  . Weakness   . Compression fracture 09/13/2015  . Fall at home 09/13/2015  . History of lung cancer   . Mobility impaired   . Fatigue 08/18/2015  . Depression 08/18/2015  . Frequent stools 08/18/2015  . Tachypnea 06/22/2015  . SIRS (systemic inflammatory response syndrome) ([place]) 06/22/2015  . Conjunctivitis 06/06/2015  . Adenocarcinoma of upper lobe of right lung 04/30/2015  . Mediastinal adenopathy 04/24/2015  . Severe obstructive sleep apnea 03/27/2015  . Lung mass 03/27/2015  . Folate deficiency 09/21/2014  . Paroxysmal  a-fib ([place]) 05/27/2014  . DOE (dyspnea on exertion) 05/27/2014  . HTN (hypertension) 05/27/2014  . Dyslipidemia 05/27/2014  . Bradycardia 05/27/2014  . Hair loss 05/27/2014  . Breast cancer ([place]) 04/17/2014  . Prediabetes 04/17/2014  . CAD (coronary artery disease) 04/16/2014  . Diverticulitis 04/16/2014  . Hyperlipidemia 04/16/2014    Past Surgical History:  Procedure Laterality Date  . BREAST SURGERY    . CARDIOVERSION    . Cataract surgery    . CESAREAN SECTION  X 2  . COLON SURGERY    . COLONOSCOPY    .  COLONOSCOPY N/A 01/13/2017   Procedure: COLONOSCOPY;  Surgeon: Gatha Mayer, MD;  Location: [facility] END[facility]COPY;  Service: Endoscopy;  Laterality: N/A;  . ESOPHAGOGASTRODUODENOSCOPY N/A 01/13/2017   Procedure: ESOPHAGOGASTRODUODENOSCOPY (EGD);  Surgeon: Gatha Mayer, MD;  Location: [facility];  Service: Endoscopy;  Laterality: N/A;  . LAPAROSCOPIC PARTIAL COLECTOMY N/A 02/08/2017   Procedure: LAPAROSCOPIC PARTIAL COLECTOMY;  Surgeon: Leighton Ruff, MD;  Location: [facility] ORS;  Service: General;  Laterality: N/A;  . MASTECTOMY Right   . MEDIASTINOSCOPY N/A 04/28/2015   Procedure: MEDIASTINOSCOPY;  Surgeon: [name], MD;  Location: [facility] OR;  Service: Thoracic;  Laterality: N/A;  . TONSILLECTOMY      OB History    No data available       Home Medications    Prior to Admission medications   Medication Sig Start Date End Date Taking? Authorizing Provider  albuterol (PROVENTIL HFA;VENTOLIN HFA) 108 (90 Base) MCG/ACT inhaler Inhale 1 puff into the lungs every 6 (six) hours as needed for wheezing or shortness of breath. Reported on 06/01/2016 11/15/16  Yes Golden Circle, FNP  apixaban (ELIQUIS) 5 MG TABS tablet Take 1 tablet by mouth twice daily. Appoint,ent needed for further refills - call cardiologist to schedule follow up visit- 08/01/17  Yes Hilty, Nadean Corwin, MD  atorvastatin (LIPITOR) 40 MG tablet TAKE 1 TABLET(40 MG) BY MOUTH DAILY Patient taking differently: TAKE 1 TABLET(40 MG) in the evening 07/03/17  Yes Hilty, Nadean Corwin, MD  buPROPion (WELLBUTRIN SR) 100 MG 12 hr tablet TAKE 1 TABLET(100 MG) BY MOUTH DAILY 08/15/17  Yes Golden Circle, FNP  CALCIUM-VITAMIN D PO Take 1 tablet by mouth daily.   Yes [provider]  digoxin (DIGOX) 0.125 MG tablet Take 1 tablet (0.125 mg total) by mouth daily. 08/16/17  Yes Almyra Deforest, PA  diltiazem (CARDIZEM CD) 240 MG 24 hr capsule Take 1 capsule (240 mg total) by mouth daily. 08/10/17  Yes Dessa Phi Chahn-Yang, DO  escitalopram (LEXAPRO) 10 MG  tablet TAKE 1 TABLET(10 MG) BY MOUTH DAILY. FOLLOW-UP APPOINTMENT IS DUE 07/24/17  Yes Golden Circle, FNP  ferrous sulfate 325 (65 FE) MG tablet Take 325 mg by mouth daily with breakfast.   Yes [provider]  fluticasone furoate-vilanterol (BREO ELLIPTA) 100-25 MCG/INH AEPB Inhale 1 puff into the lungs daily. 05/17/17  Yes Golden Circle, FNP  furosemide (LASIX) 20 MG tablet Take 1 tablet (20 mg total) by mouth daily. 08/10/17  Yes Dessa Phi Chahn-Yang, DO  loratadine (CLARITIN) 10 MG tablet TAKE 1 TABLET BY MOUTH AT BEDTIME 06/12/17  Yes Calone, Ples Specter, FNP  LORazepam (ATIVAN) 2 MG tablet TAKE 1 TABLET BY MOUTH AT BEDTIME AS NEEDED FOR ANXIETY Patient taking differently: Take 1 mg by mouth every 6 (six) hours as needed for sleep. TAKE 1 TABLET BY MOUTH AT BEDTIME AS NEEDED FOR ANXIETY 08/22/17  Yes Golden Circle, FNP  Multiple Vitamins-Minerals (MULTIVITAMIN WITH MINERALS) tablet Take 1 tablet by mouth daily. gummie   Yes [provider]  NUTRITIONAL SUPPLEMENT LIQD Take 120 mLs by mouth 3 (three) times daily. MedPass   Yes [provider]  oxybutynin (DITROPAN) 5 MG tablet TAKE 1 TABLET BY MOUTH TWICE DAILY Patient taking differently: Take 5 mg by mouth 2 (two) times daily. TAKE 1 TABLET BY MOUTH TWICE DAILY 07/14/17  Yes Golden Circle, FNP  oxyCODONE (OXY IR/ROXICODONE) 5 MG immediate release tablet Take 0.5-1 tablets (2.5-5 mg total) by mouth every 6 (six) hours as needed for severe pain. 04/18/17  Yes Golden Circle, FNP  OXYGEN Inhale 2 L into the lungs as needed.   Yes [provider]  pantoprazole (PROTONIX) 40 MG tablet Take 1 tablet (40 mg total) by mouth daily. 08/15/17  Yes Golden Circle, FNP    Family History Family History  Problem Relation Age of Onset  . Mental illness Mother     Social History Social History  Substance Use Topics  . Smoking status: Former Smoker    Packs/day: 1.00    Years: 35.00    Types:  Cigarettes    Quit date: 11/28/1992  . Smokeless tobacco: Never Used     Comment: "stopped smoking at age 47"  . Alcohol use 5.4 oz/week    9 Glasses of wine per week     Comment: 04/28/2015 "drink 3, 6oz,  glasses of wine on Sat & Sun"     Allergies   Patient has no known allergies.   Review of Systems Review of Systems  Constitutional: Negative for fever.  Respiratory: Positive for cough and shortness of breath.   Cardiovascular: Positive for chest pain. Negative for leg swelling.  Gastrointestinal: Negative for vomiting (none in 2 days).  All other systems reviewed and are negative.    Physical Exam Updated Vital Signs BP 119/69   Pulse 92   Temp 97.6 F (36.4 C) (Oral)   Resp (!) 22   Ht 5' 6.5" (1.689 m)   Wt 81.6 kg (180 lb)   LMP  (LMP Unknown)   SpO2 98%   BMI 28.62 kg/m   Physical Exam  Constitutional: She is oriented to person, place, and time. She appears well-developed and well-nourished.  obese  HENT:  Head: Normocephalic and atraumatic.  Right Ear: External ear normal.  Left Ear: External ear normal.  Nose: Nose normal.  Eyes: Right eye exhibits no discharge. Left eye exhibits no discharge.  Cardiovascular: Normal rate, regular rhythm and normal heart sounds.   Pulmonary/Chest: Effort normal. No accessory muscle usage. Tachypnea noted. She has decreased breath sounds in the right lower field. She exhibits tenderness (mid-sternal).  Abdominal: Soft. There is no tenderness.  Musculoskeletal: She exhibits no edema.  Neurological: She is alert and oriented to person, place, and time.  Skin: Skin is warm and dry. She is not diaphoretic.  Nursing note and vitals reviewed.    ED Treatments / Results  Labs (all labs ordered are listed, but only abnormal results are displayed) Labs Reviewed  BASIC METABOLIC PANEL - Abnormal; Notable for the following:       Result Value   Creatinine, Ser 1.06 (*)    GFR calc non Af Amer 48 (*)    GFR calc Af Amer 56  (*)    All other components within normal limits  BRAIN NATRIURETIC PEPTIDE - Abnormal; Notable for the following:    B Natriuretic Peptide 196.8 (*)  All other components within normal limits  CBC WITH DIFFERENTIAL/PLATELET  TROPONIN I    EKG  EKG Interpretation  Date/Time:  Thursday August 24 2017 17:26:00 EDT Ventricular Rate:  93 PR Interval:    QRS Duration: 83 QT Interval:  373 QTC Calculation: 464 R Axis:   60 Text Interpretation:  Atrial fibrillation similar to prior Confirmed by Sherwood Gambler [phone]) on 08/24/2017 5:32:18 PM       Radiology Dg Chest 2 View  Result Date: 08/24/2017 CLINICAL DATA:  Chest pain and cough EXAM: CHEST  2 VIEW COMPARISON:  08/09/2017 FINDINGS: The heart size and mediastinal contours are within normal limits. Atherosclerotic calcifications are noted at the aortic arch. There is volume loss of the right lung with tenting of the inferior pulmonary ligament and scarring in the right upper lobe. This appears chronic. Fine interstitial prominence is seen bilaterally of the lungs without pneumonic consolidation. Stable small right posterior pleural effusion. Axillary clips are seen on the right. No acute nor suspicious osseous abnormalities. The visualized skeletal structures are unremarkable. IMPRESSION: 1. Chronic volume loss on the right with tenting of the inferior pulmonary ligament and scarring at the right lung apex. 2. Fine interstitial prominence of the lungs may reflect fibrosis. 3. Chronic stable small posterior pleural effusion. Electronically Signed   By: Ashley Royalty M.D.   On: 08/24/2017 19:15   Ct Angio Chest Pe W/cm &/or Wo Cm  Result Date: 08/24/2017 CLINICAL DATA:  82 y/o F; tachypnea and hypoxia, suspect pulmonary embolus. History of lung cancer post radiation treatment. EXAM: CT ANGIOGRAPHY CHEST WITH CONTRAST TECHNIQUE: Multidetector CT imaging of the chest was performed using the standard protocol during bolus administration of  intravenous contrast. Multiplanar CT image reconstructions and MIPs were obtained to evaluate the vascular anatomy. CONTRAST:  69 cc Isovue 370 COMPARISON:  08/05/2017 CTA chest.  12/22/2016 PET-CT. FINDINGS: Cardiovascular: Normal heart size. No pericardial effusion. Moderate coronary artery calcification. Aortic valvular and mitral annular calcifications can be associated with valvular dysfunction. Normal caliber thoracic aorta with moderate calcific atherosclerosis. Satisfactory opacification of the pulmonary arteries. No pulmonary embolus identified. Mediastinum/Nodes: Stable right lower paratracheal, right upper peritracheal, and carinal lymphadenopathy. Decreased size of prevascular lymph nodes. Lungs/Pleura: Diminution in pleural effusions with small residual right-sided pleural effusion. Interlobular septal thickening is present although decreased when compared to prior CT of the chest compatible with interstitial edema. Additionally, there are fine reticular opacities at periphery of the lungs likely representing component of pulmonary fibrosis. There are numerous small (3-5 mm) peripheral pulmonary nodules greatest in the lung bases and there is a stable nodule in the right lower lobe measuring 15 x 8 mm (series 7, image 63) which demonstrated FDG uptake on prior PET-CT, attention at follow-up is recommended. The dense radiation fibrosis in the right upper lobe extending into the right hilum with atelectatic lung, vascular pruning, in confluent soft tissue. Evaluation for underlying neoplasm is best assessed with PET-CT. Upper Abdomen: Stable 9 mm left adrenal nodule.  Cholecystectomy. Musculoskeletal: Stable moderate L1 compression deformity. No new acute osseous abnormality is identified. Review of the MIP images confirms the above findings. IMPRESSION: 1. No pulmonary embolus identified. 2. Interval decrease in pleural effusions with small residual right-sided pleural effusion. 3. Interstitial pulmonary  edema, decreased compared with prior CTA. 4. Radiation fibrosis changes/treated neoplasm in the right upper lobe extending into the right hilum are stable from prior CTA. 5. Stable right lower and upper paratracheal as well as sub- carinal lymphadenopathy, attention at follow-up  recommended. Decreased size of prevascular lymph nodes probably due to decrease in pulmonary vascular congestion. 6. Several small peripheral pulmonary nodules and 15 mm right lower lobe pulmonary nodule are stable. 7. Stable subcentimeter left adrenal nodule. 8. Stable moderate L1 compression deformity. 9. Moderate coronary artery and aortic calcific atherosclerosis. Electronically Signed   By: Kristine Garbe M.D.   On: 08/24/2017 22:37    Procedures Procedures (including critical care time)  Medications Ordered in ED Medications  morphine 4 MG/ML injection 4 mg (4 mg Intravenous Given 08/24/17 1808)  iopamidol (ISOVUE-370) 76 % injection (100 mLs  Contrast Given 08/24/17 2156)  furosemide (LASIX) injection 40 mg (40 mg Intravenous Given 08/24/17 2306)     Initial Impression / Assessment and Plan / ED Course  I have reviewed the triage vital signs and the nursing notes.  Pertinent labs & imaging results that were available during my care of the patient were reviewed by me and considered in my medical decision making (see chart for details).     Given the persistent pleuritic chest pain, CTA obtained which shows no PE. There is pulmonary edema which would explain they dyspnea. Thus will give IV lasix. She does not fell well enough to go home, thus will admit for monitoring and diuresis. hospitalist to admit.   Final Clinical Impressions(s) / ED Diagnoses   Final diagnoses:  Acute pulmonary edema ([place])    New Prescriptions New Prescriptions   No medications on file     Sherwood Gambler, MD 08/24/17 2336

## 2017-08-25 DIAGNOSIS — I4891 Unspecified atrial fibrillation: Secondary | ICD-10-CM | POA: Diagnosis not present

## 2017-08-25 DIAGNOSIS — C3411 Malignant neoplasm of upper lobe, right bronchus or lung: Secondary | ICD-10-CM | POA: Diagnosis not present

## 2017-08-25 DIAGNOSIS — R05 Cough: Secondary | ICD-10-CM

## 2017-08-25 DIAGNOSIS — J441 Chronic obstructive pulmonary disease with (acute) exacerbation: Secondary | ICD-10-CM | POA: Diagnosis not present

## 2017-08-25 DIAGNOSIS — R0600 Dyspnea, unspecified: Secondary | ICD-10-CM

## 2017-08-25 LAB — COMPREHENSIVE METABOLIC PANEL
ALBUMIN: 3.4 g/dL — AB (ref 3.5–5.0)
ALT: 19 U/L (ref 14–54)
AST: 22 U/L (ref 15–41)
Alkaline Phosphatase: 46 U/L (ref 38–126)
Anion gap: 13 (ref 5–15)
BUN: 9 mg/dL (ref 6–20)
CHLORIDE: 101 mmol/L (ref 101–111)
CO2: 24 mmol/L (ref 22–32)
Calcium: 8.5 mg/dL — ABNORMAL LOW (ref 8.9–10.3)
Creatinine, Ser: 0.91 mg/dL (ref 0.44–1.00)
GFR calc Af Amer: >60 mL/min (ref >60)
GFR calc non Af Amer: 58 mL/min — ABNORMAL LOW (ref >60)
GLUCOSE: 186 mg/dL — AB (ref 65–99)
POTASSIUM: 3.1 mmol/L — AB (ref 3.5–5.1)
SODIUM: 138 mmol/L (ref 135–145)
Total Bilirubin: 1.2 mg/dL (ref 0.3–1.2)
Total Protein: 6.2 g/dL — ABNORMAL LOW (ref 6.5–8.1)

## 2017-08-25 LAB — TROPONIN I
Troponin I: <0.03 ng/mL (ref <0.03)
Troponin I: <0.03 ng/mL (ref <0.03)
Troponin I: <0.03 ng/mL (ref <0.03)

## 2017-08-25 LAB — CBC
HEMATOCRIT: 38.7 % (ref 36.0–46.0)
Hemoglobin: 12.5 g/dL (ref 12.0–15.0)
MCH: 29.4 pg (ref 26.0–34.0)
MCHC: 32.3 g/dL (ref 30.0–36.0)
MCV: 91.1 fL (ref 78.0–100.0)
Platelets: 297 10*3/uL (ref 150–400)
RBC: 4.25 MIL/uL (ref 3.87–5.11)
RDW: 14.5 % (ref 11.5–15.5)
WBC: 5.8 10*3/uL (ref 4.0–10.5)

## 2017-08-25 LAB — TSH: TSH: 3.426 u[IU]/mL (ref 0.350–4.500)

## 2017-08-25 MED ORDER — POTASSIUM CHLORIDE 20 MEQ PO PACK
40.0000 meq | PACK | ORAL | Status: AC
  Administered 2017-08-25 (×2): 40 meq via ORAL
  Filled 2017-08-25 (×3): qty 2

## 2017-08-25 MED ORDER — BOOST / RESOURCE BREEZE PO LIQD
237.0000 mL | Freq: Three times a day (TID) | ORAL | Status: DC
  Administered 2017-08-25 (×2): 1 via ORAL
  Administered 2017-08-26: 237 mL via ORAL
  Administered 2017-08-26: 1 via ORAL
  Filled 2017-08-25: qty 1

## 2017-08-25 MED ORDER — ACETAMINOPHEN 650 MG RE SUPP: 650.0000 mg | Freq: Four times a day (QID) | RECTAL | Status: DC | PRN

## 2017-08-25 MED ORDER — ACETAMINOPHEN 325 MG PO TABS
650.0000 mg | ORAL_TABLET | Freq: Four times a day (QID) | ORAL | Status: DC | PRN
  Administered 2017-08-25: 650 mg via ORAL
  Filled 2017-08-25: qty 2

## 2017-08-25 MED ORDER — OXYCODONE HCL 5 MG PO TABS
2.5000 mg | ORAL_TABLET | Freq: Four times a day (QID) | ORAL | Status: DC | PRN
  Administered 2017-08-26: 5 mg via ORAL
  Filled 2017-08-25: qty 1

## 2017-08-25 MED ORDER — ALBUTEROL SULFATE (2.5 MG/3ML) 0.083% IN NEBU: 2.5000 mg | INHALATION_SOLUTION | Freq: Four times a day (QID) | RESPIRATORY_TRACT | Status: DC | PRN

## 2017-08-25 MED ORDER — OXYBUTYNIN CHLORIDE 5 MG PO TABS
5.0000 mg | ORAL_TABLET | Freq: Two times a day (BID) | ORAL | Status: DC
  Administered 2017-08-25 – 2017-08-27 (×6): 5 mg via ORAL
  Filled 2017-08-25 (×6): qty 1

## 2017-08-25 MED ORDER — AZITHROMYCIN 250 MG PO TABS
500.0000 mg | ORAL_TABLET | Freq: Every day | ORAL | Status: DC
  Administered 2017-08-25 – 2017-08-27 (×3): 500 mg via ORAL
  Filled 2017-08-25 (×3): qty 2

## 2017-08-25 MED ORDER — METHYLPREDNISOLONE SODIUM SUCC 40 MG IJ SOLR
40.0000 mg | Freq: Two times a day (BID) | INTRAMUSCULAR | Status: DC
  Administered 2017-08-25 – 2017-08-27 (×4): 40 mg via INTRAVENOUS
  Filled 2017-08-25 (×5): qty 1

## 2017-08-25 MED ORDER — DEXTROSE 5 % IV SOLN
500.0000 mg | Freq: Every day | INTRAVENOUS | Status: DC
  Administered 2017-08-25: 500 mg via INTRAVENOUS
  Filled 2017-08-25: qty 500

## 2017-08-25 MED ORDER — APIXABAN 5 MG PO TABS
5.0000 mg | ORAL_TABLET | Freq: Two times a day (BID) | ORAL | Status: DC
  Administered 2017-08-25 – 2017-08-27 (×6): 5 mg via ORAL
  Filled 2017-08-25 (×6): qty 1

## 2017-08-25 MED ORDER — LORAZEPAM 1 MG PO TABS
1.0000 mg | ORAL_TABLET | Freq: Four times a day (QID) | ORAL | Status: DC | PRN
Start: 2017-08-25 — End: 2017-08-27
  Administered 2017-08-25: 1 mg via ORAL
  Filled 2017-08-25: qty 1

## 2017-08-25 MED ORDER — BUPROPION HCL ER (SR) 100 MG PO TB12
100.0000 mg | ORAL_TABLET | Freq: Two times a day (BID) | ORAL | Status: DC
  Administered 2017-08-25 – 2017-08-27 (×5): 100 mg via ORAL
  Filled 2017-08-25 (×6): qty 1

## 2017-08-25 MED ORDER — IPRATROPIUM-ALBUTEROL 0.5-2.5 (3) MG/3ML IN SOLN
3.0000 mL | Freq: Four times a day (QID) | RESPIRATORY_TRACT | Status: DC
  Administered 2017-08-25 – 2017-08-26 (×3): 3 mL via RESPIRATORY_TRACT
  Filled 2017-08-25 (×3): qty 3

## 2017-08-25 MED ORDER — PANTOPRAZOLE SODIUM 40 MG PO TBEC
40.0000 mg | DELAYED_RELEASE_TABLET | Freq: Every day | ORAL | Status: DC
  Administered 2017-08-25 – 2017-08-27 (×3): 40 mg via ORAL
  Filled 2017-08-25 (×3): qty 1

## 2017-08-25 MED ORDER — IPRATROPIUM-ALBUTEROL 0.5-2.5 (3) MG/3ML IN SOLN: 3.0000 mL | RESPIRATORY_TRACT | Status: DC | PRN

## 2017-08-25 MED ORDER — ESCITALOPRAM OXALATE 10 MG PO TABS
10.0000 mg | ORAL_TABLET | Freq: Every day | ORAL | Status: DC
  Administered 2017-08-25 – 2017-08-27 (×3): 10 mg via ORAL
  Filled 2017-08-25 (×3): qty 1

## 2017-08-25 MED ORDER — ATORVASTATIN CALCIUM 40 MG PO TABS
40.0000 mg | ORAL_TABLET | Freq: Every day | ORAL | Status: DC
  Administered 2017-08-25 – 2017-08-26 (×2): 40 mg via ORAL
  Filled 2017-08-25 (×2): qty 1

## 2017-08-25 MED ORDER — SODIUM CHLORIDE 0.9% FLUSH: 3.0000 mL | INTRAVENOUS | Status: DC | PRN

## 2017-08-25 MED ORDER — DILTIAZEM HCL ER COATED BEADS 240 MG PO CP24
240.0000 mg | ORAL_CAPSULE | Freq: Every day | ORAL | Status: DC
  Administered 2017-08-25 – 2017-08-27 (×3): 240 mg via ORAL
  Filled 2017-08-25 (×3): qty 1

## 2017-08-25 MED ORDER — SODIUM CHLORIDE 0.9% FLUSH
3.0000 mL | Freq: Two times a day (BID) | INTRAVENOUS | Status: DC
  Administered 2017-08-25 – 2017-08-26 (×5): 3 mL via INTRAVENOUS

## 2017-08-25 MED ORDER — FERROUS SULFATE 325 (65 FE) MG PO TABS
325.0000 mg | ORAL_TABLET | Freq: Every day | ORAL | Status: DC
  Administered 2017-08-25 – 2017-08-27 (×3): 325 mg via ORAL
  Filled 2017-08-25 (×3): qty 1

## 2017-08-25 MED ORDER — FUROSEMIDE 10 MG/ML IJ SOLN
20.0000 mg | Freq: Every day | INTRAMUSCULAR | Status: DC
  Administered 2017-08-25: 20 mg via INTRAVENOUS
  Filled 2017-08-25: qty 2

## 2017-08-25 MED ORDER — FUROSEMIDE 20 MG PO TABS
20.0000 mg | ORAL_TABLET | Freq: Every day | ORAL | Status: DC
  Administered 2017-08-25 – 2017-08-26 (×2): 20 mg via ORAL
  Filled 2017-08-25 (×2): qty 1

## 2017-08-25 MED ORDER — FLUTICASONE FUROATE-VILANTEROL 100-25 MCG/INH IN AEPB
1.0000 | INHALATION_SPRAY | Freq: Every day | RESPIRATORY_TRACT | Status: DC
  Administered 2017-08-25 – 2017-08-27 (×3): 1 via RESPIRATORY_TRACT
  Filled 2017-08-25: qty 28

## 2017-08-25 MED ORDER — LORATADINE 10 MG PO TABS
10.0000 mg | ORAL_TABLET | Freq: Every day | ORAL | Status: DC
  Administered 2017-08-25 – 2017-08-26 (×2): 10 mg via ORAL
  Filled 2017-08-25 (×2): qty 1

## 2017-08-25 MED ORDER — ALBUTEROL SULFATE HFA 108 (90 BASE) MCG/ACT IN AERS: 1.0000 | INHALATION_SPRAY | Freq: Four times a day (QID) | RESPIRATORY_TRACT | Status: DC | PRN

## 2017-08-25 MED ORDER — SODIUM CHLORIDE 0.9 % IV SOLN: 250.0000 mL | INTRAVENOUS | Status: DC | PRN

## 2017-08-25 MED ORDER — DIGOXIN 125 MCG PO TABS
0.1250 mg | ORAL_TABLET | Freq: Every day | ORAL | Status: DC
  Administered 2017-08-25 – 2017-08-27 (×3): 0.125 mg via ORAL
  Filled 2017-08-25 (×3): qty 1

## 2017-08-25 NOTE — Progress Notes (Signed)
PROGRESS NOTE    Pamela Mcguire  ZOX:[id number] DOB: [birth year] DOA: 08/24/2017 PCP: Hoyt Koch, MD    Brief Narrative:  82 year old female who presented with dyspnea. Patient does have significant past medical history of non-small cell lung cancer stage III, atrial fibrillation, hypertension, dementia, and history of tobacco abuse. Patient presents with 3 days of worsening dyspnea, to the point where she was symptomatic with minimal efforts. Associated with increase cough and mucus production. Patient has been recently placed on supplemental home oxygen, at home she uses, inhaled corticosteroid and long-acting beta 2 agonist. She presented to the hospital due to worsening symptoms. n initial physical examination, blood pressure 119/69, heart rate 92, temperature 97.6, respiratory 22, oxygen saturation 98%, or supplemental oxygen per nasal cannula. She had moist mucous membranes, decreased breath sounds bilaterally with poor air movement, heart S1-S2 irregularly regular, no rubs, gallops or murmurs, the abdomen was soft nontender, no lower extremity edema. Sodium 138, potassium 4.1, chloride 102, bicarbonate 24, glucose 98, BNP 14, creatinine 1.06, white count 7.6, hemoglobin 13.1, hematocrit 41.3, platelets 348. Chest film with right base scar/atelectasis, increase interstitial markings bilaterally, no pneumonic infiltrate. EKG with atrial fibrillation.   Patient admitted to the hospital working diagnosis of worsening dyspnea likely due to a COPD exacerbation.   Assessment & Plan:   Principal Problem:   Dyspnea Active Problems:   Adenocarcinoma of upper lobe of right lung   Cough   Atrial fibrillation with RVR ([facility])  1. COPD exacerbation. Will start patient on aggressive bronchodilator therapy with duoneb q 6 h and q2 h as needed, systemic steroids with methylprednisolone 40 mg iv q12, and oral azithromycin, will continue oxymetry monitoring and supplemental 02 per [place]. Chest film  personally reviewed noted bilateral interstitial infiltrate and scaring at the right base.   2. Chronic atrial fibrillation. Will continue rate control and anticoagulation, no signs of heart failure, will change furosemide to po dose.   3. Hypertension. Continue on antihypertensive medications,   4. Dementia. No agitation or confusion, continue neuro checks per unit protocol  5. History of lung cancer. Stable, apparently sp radiation, will request further records. Will continue oxymetry monitoring.    DVT prophylaxis: apixaban  Code Status: full Family Communication: No family at the bedside  Disposition Plan: home   Consultants:     Procedures:     Antimicrobials:       Subjective: Patient with persistent dyspnea, worse with exertion, positive cough with mucous production, no nausea or vomiting, no chest pain, palpitations, pnd or orthopnea.   Objective: Vitals:   08/25/17 0910 08/25/17 0922 08/25/17 1000 08/25/17 1004  BP: (!) 116/54 (!) 116/54 128/61 128/61  Pulse: 76  77 71  Resp: (!) 23  18 20   Temp:      TempSrc:      SpO2: 96%  93% 95%  Weight:      Height:        Intake/Output Summary (Last 24 hours) at 08/25/17 1302 Last data filed at 08/25/17 1020  Gross per 24 hour  Intake              150 ml  Output              800 ml  Net             -650 ml   Filed Weights   08/24/17 1924  Weight: 81.6 kg (180 lb)    Examination:  General: deconditioned and dyspneic Neurology: Awake and alert, non  focal  E ENT: mild pallor, no icterus, oral mucosa moist Cardiovascular: No JVD. S1-S2 irregulary irregular, no gallops, rubs, or murmurs. No lower extremity edema. Pulmonary: decreased breath sounds bilaterally, decreased air movement, no wheezing, or rhonchi, but scattered rales. Gastrointestinal. Abdomen flat, no organomegaly, non tender, no rebound or guarding Skin. No rashes Musculoskeletal: no joint deformities     Data Reviewed: I have personally  reviewed following labs and imaging studies  CBC:  Recent Labs Lab 08/24/17 1805 08/25/17 0336  WBC 7.6 5.8  NEUTROABS 5.9  --   HGB 13.1 12.5  HCT 41.3 38.7  MCV 91.8 91.1  PLT 348 768   Basic Metabolic Panel:  Recent Labs Lab 08/22/17 1531 08/24/17 1805 08/25/17 0336  NA 139 138 138  K 4.2 4.1 3.1*  CL 101 102 101  CO2 33* 24 24  GLUCOSE 70 98 186*  BUN 20 14 9   CREATININE 0.89 1.06* 0.91  CALCIUM 9.8 9.2 8.5*   GFR: Estimated Creatinine Clearance: 53.6 mL/min (by C-G formula based on SCr of 0.91 mg/dL). Liver Function Tests:  Recent Labs Lab 08/22/17 1531 08/25/17 0336  AST 14 22  ALT 11 19  ALKPHOS 55 46  BILITOT 0.6 1.2  PROT 7.2 6.2*  ALBUMIN 3.9 3.4*   No results for input(s): LIPASE, AMYLASE in the last 168 hours. No results for input(s): AMMONIA in the last 168 hours. Coagulation Profile: No results for input(s): INR, PROTIME in the last 168 hours. Cardiac Enzymes:  Recent Labs Lab 08/24/17 1805 08/25/17 0336 08/25/17 0836  TROPONINI <0.03 <0.03 <0.03   BNP (last 3 results) No results for input(s): PROBNP in the last 8760 hours. HbA1C: No results for input(s): HGBA1C in the last 72 hours. CBG: No results for input(s): GLUCAP in the last 168 hours. Lipid Profile: No results for input(s): CHOL, HDL, LDLCALC, TRIG, CHOLHDL, LDLDIRECT in the last 72 hours. Thyroid Function Tests:  Recent Labs  08/25/17 0336  TSH 3.426   Anemia Panel: No results for input(s): VITAMINB12, FOLATE, FERRITIN, TIBC, IRON, RETICCTPCT in the last 72 hours.    Radiology Studies: I have reviewed all of the imaging during this hospital visit personally     Scheduled Meds: . apixaban  5 mg Oral BID  . atorvastatin  40 mg Oral q1800  . azithromycin  500 mg Oral Daily  . buPROPion  100 mg Oral BID WC  . digoxin  0.125 mg Oral Daily  . diltiazem  240 mg Oral Daily  . escitalopram  10 mg Oral Daily  . feeding supplement  237 mL Oral TID  . ferrous  sulfate  325 mg Oral Q breakfast  . fluticasone furoate-vilanterol  1 puff Inhalation Daily  . furosemide  20 mg Oral Daily  . ipratropium-albuterol  3 mL Nebulization Q6H  . loratadine  10 mg Oral QHS  . methylPREDNISolone (SOLU-MEDROL) injection  40 mg Intravenous Q12H  . oxybutynin  5 mg Oral BID  . pantoprazole  40 mg Oral Daily  . sodium chloride flush  3 mL Intravenous Q12H   Continuous Infusions: . sodium chloride       LOS: 0 days        Lauran Romanski Gerome Apley, MD [facility] Pager [phone]

## 2017-08-25 NOTE — ED Notes (Signed)
Attempted report 

## 2017-08-25 NOTE — Progress Notes (Signed)
ANTICOAGULATION CONSULT NOTE - Initial Consult  Pharmacy Consult for Eliquis Indication: atrial fibrillation  No Known Allergies  Patient Measurements: Height: 5' 6.5" (168.9 cm) Weight: 180 lb (81.6 kg) IBW/kg (Calculated) : 60.45  Vital Signs: Temp: 97.6 F (36.4 C) (09/27 1716) Temp Source: Oral (09/27 1716) BP: 125/68 (09/28 0045) Pulse Rate: 79 (09/28 0045)  Labs:  Recent Labs  08/22/17 1531 08/24/17 1805  HGB  --  13.1  HCT  --  41.3  PLT  --  348  CREATININE 0.89 1.06*  TROPONINI  --  <0.03    Estimated Creatinine Clearance: 46 mL/min (A) (by C-G formula based on SCr of 1.06 mg/dL (H)).   Medical History: Past Medical History:  Diagnosis Date  . AF (atrial fibrillation) ([place])   . Anxiety   . Arthritis    "thighs, hips, arms" (04/28/2015)  . Breast cancer ([place])    right mastectomy  . CAD (coronary artery disease)   . Dehydration 12/06/2016  . Dementia   . Depression   . Diverticulitis   . GERD (gastroesophageal reflux disease)   . HCAP (healthcare-associated pneumonia) 09/15/2015  . Hyperlipidemia   . Hypertension   . Non-small cell carcinoma of lung, stage 3 ([place]) 04/30/2015  . Prediabetes   . Seasonal allergies   . Shortness of breath dyspnea   . Sleep apnea     dx'd 10/2014,refused cpap machine  . Urinary frequency   . UTI (urinary tract infection) 06/06/2015    Medications:  No current facility-administered medications on file prior to encounter.    Current Outpatient Prescriptions on File Prior to Encounter  Medication Sig Dispense Refill  . albuterol (PROVENTIL HFA;VENTOLIN HFA) 108 (90 Base) MCG/ACT inhaler Inhale 1 puff into the lungs every 6 (six) hours as needed for wheezing or shortness of breath. Reported on 06/01/2016 18 g 1  . apixaban (ELIQUIS) 5 MG TABS tablet Take 1 tablet by mouth twice daily. Appoint,ent needed for further refills - call cardiologist to schedule follow up visit- 60 tablet 0  . atorvastatin (LIPITOR) 40 MG tablet  TAKE 1 TABLET(40 MG) BY MOUTH DAILY (Patient taking differently: TAKE 1 TABLET(40 MG) in the evening) 90 tablet 0  . buPROPion (WELLBUTRIN SR) 100 MG 12 hr tablet TAKE 1 TABLET(100 MG) BY MOUTH DAILY 90 tablet 0  . CALCIUM-VITAMIN D PO Take 1 tablet by mouth daily.    . digoxin (DIGOX) 0.125 MG tablet Take 1 tablet (0.125 mg total) by mouth daily. 30 tablet 5  . diltiazem (CARDIZEM CD) 240 MG 24 hr capsule Take 1 capsule (240 mg total) by mouth daily. 30 capsule 0  . escitalopram (LEXAPRO) 10 MG tablet TAKE 1 TABLET(10 MG) BY MOUTH DAILY. FOLLOW-UP APPOINTMENT IS DUE 30 tablet 0  . fluticasone furoate-vilanterol (BREO ELLIPTA) 100-25 MCG/INH AEPB Inhale 1 puff into the lungs daily. 28 each 3  . furosemide (LASIX) 20 MG tablet Take 1 tablet (20 mg total) by mouth daily. 30 tablet 0  . loratadine (CLARITIN) 10 MG tablet TAKE 1 TABLET BY MOUTH AT BEDTIME 90 tablet 0  . LORazepam (ATIVAN) 2 MG tablet TAKE 1 TABLET BY MOUTH AT BEDTIME AS NEEDED FOR ANXIETY (Patient taking differently: Take 1 mg by mouth every 6 (six) hours as needed for sleep. TAKE 1 TABLET BY MOUTH AT BEDTIME AS NEEDED FOR ANXIETY) 30 tablet 1  . Multiple Vitamins-Minerals (MULTIVITAMIN WITH MINERALS) tablet Take 1 tablet by mouth daily. gummie    . NUTRITIONAL SUPPLEMENT LIQD Take 120 mLs by  mouth 3 (three) times daily. [employer]    . oxybutynin (DITROPAN) 5 MG tablet TAKE 1 TABLET BY MOUTH TWICE DAILY (Patient taking differently: Take 5 mg by mouth 2 (two) times daily. TAKE 1 TABLET BY MOUTH TWICE DAILY) 180 tablet 0  . oxyCODONE (OXY IR/ROXICODONE) 5 MG immediate release tablet Take 0.5-1 tablets (2.5-5 mg total) by mouth every 6 (six) hours as needed for severe pain. 30 tablet 0  . OXYGEN Inhale 2 L into the lungs as needed.    . pantoprazole (PROTONIX) 40 MG tablet Take 1 tablet (40 mg total) by mouth daily. 90 tablet 1     Assessment: 82 y.o. female admitted with SOB, h/o Afib, to continue Eliquis  Plan:  Eliquis 5 mg  BID  Caryl Pina 08/25/2017,2:14 AM

## 2017-08-25 NOTE — ED Notes (Signed)
Pt has not slept all night

## 2017-08-25 NOTE — ED Notes (Signed)
Bed starightened

## 2017-08-25 NOTE — ED Notes (Signed)
The pt gets exertionally sob

## 2017-08-26 DIAGNOSIS — Z87891 Personal history of nicotine dependence: Secondary | ICD-10-CM | POA: Diagnosis not present

## 2017-08-26 DIAGNOSIS — I2510 Atherosclerotic heart disease of native coronary artery without angina pectoris: Secondary | ICD-10-CM | POA: Diagnosis present

## 2017-08-26 DIAGNOSIS — F0390 Unspecified dementia without behavioral disturbance: Secondary | ICD-10-CM | POA: Diagnosis present

## 2017-08-26 DIAGNOSIS — Z9889 Other specified postprocedural states: Secondary | ICD-10-CM | POA: Diagnosis not present

## 2017-08-26 DIAGNOSIS — J441 Chronic obstructive pulmonary disease with (acute) exacerbation: Secondary | ICD-10-CM | POA: Diagnosis present

## 2017-08-26 DIAGNOSIS — Z7951 Long term (current) use of inhaled steroids: Secondary | ICD-10-CM | POA: Diagnosis not present

## 2017-08-26 DIAGNOSIS — M1990 Unspecified osteoarthritis, unspecified site: Secondary | ICD-10-CM | POA: Diagnosis present

## 2017-08-26 DIAGNOSIS — I110 Hypertensive heart disease with heart failure: Secondary | ICD-10-CM | POA: Diagnosis present

## 2017-08-26 DIAGNOSIS — Z818 Family history of other mental and behavioral disorders: Secondary | ICD-10-CM | POA: Diagnosis not present

## 2017-08-26 DIAGNOSIS — Z9011 Acquired absence of right breast and nipple: Secondary | ICD-10-CM | POA: Diagnosis not present

## 2017-08-26 DIAGNOSIS — C3410 Malignant neoplasm of upper lobe, unspecified bronchus or lung: Secondary | ICD-10-CM | POA: Diagnosis present

## 2017-08-26 DIAGNOSIS — C3411 Malignant neoplasm of upper lobe, right bronchus or lung: Secondary | ICD-10-CM

## 2017-08-26 DIAGNOSIS — I4891 Unspecified atrial fibrillation: Secondary | ICD-10-CM | POA: Diagnosis not present

## 2017-08-26 DIAGNOSIS — J810 Acute pulmonary edema: Secondary | ICD-10-CM | POA: Diagnosis present

## 2017-08-26 DIAGNOSIS — Z853 Personal history of malignant neoplasm of breast: Secondary | ICD-10-CM | POA: Diagnosis not present

## 2017-08-26 DIAGNOSIS — Z9049 Acquired absence of other specified parts of digestive tract: Secondary | ICD-10-CM | POA: Diagnosis not present

## 2017-08-26 DIAGNOSIS — K219 Gastro-esophageal reflux disease without esophagitis: Secondary | ICD-10-CM | POA: Diagnosis present

## 2017-08-26 DIAGNOSIS — R0600 Dyspnea, unspecified: Secondary | ICD-10-CM | POA: Diagnosis not present

## 2017-08-26 DIAGNOSIS — I509 Heart failure, unspecified: Secondary | ICD-10-CM | POA: Diagnosis present

## 2017-08-26 DIAGNOSIS — Z9981 Dependence on supplemental oxygen: Secondary | ICD-10-CM | POA: Diagnosis not present

## 2017-08-26 DIAGNOSIS — Z85038 Personal history of other malignant neoplasm of large intestine: Secondary | ICD-10-CM | POA: Diagnosis not present

## 2017-08-26 DIAGNOSIS — G4730 Sleep apnea, unspecified: Secondary | ICD-10-CM | POA: Diagnosis present

## 2017-08-26 DIAGNOSIS — E278 Other specified disorders of adrenal gland: Secondary | ICD-10-CM | POA: Diagnosis present

## 2017-08-26 DIAGNOSIS — I482 Chronic atrial fibrillation: Secondary | ICD-10-CM | POA: Diagnosis present

## 2017-08-26 DIAGNOSIS — Z79899 Other long term (current) drug therapy: Secondary | ICD-10-CM | POA: Diagnosis not present

## 2017-08-26 DIAGNOSIS — J9811 Atelectasis: Secondary | ICD-10-CM | POA: Diagnosis present

## 2017-08-26 LAB — BASIC METABOLIC PANEL
ANION GAP: 12 (ref 5–15)
BUN: 18 mg/dL (ref 6–20)
CO2: 26 mmol/L (ref 22–32)
Calcium: 9.4 mg/dL (ref 8.9–10.3)
Chloride: 98 mmol/L — ABNORMAL LOW (ref 101–111)
Creatinine, Ser: 1.2 mg/dL — ABNORMAL HIGH (ref 0.44–1.00)
GFR, EST AFRICAN AMERICAN: 48 mL/min — AB (ref >60)
GFR, EST NON AFRICAN AMERICAN: 42 mL/min — AB (ref >60)
Glucose, Bld: 291 mg/dL — ABNORMAL HIGH (ref 65–99)
POTASSIUM: 4 mmol/L (ref 3.5–5.1)
SODIUM: 136 mmol/L (ref 135–145)

## 2017-08-26 LAB — CBC WITH DIFFERENTIAL/PLATELET
Basophils Absolute: 0 10*3/uL (ref 0.0–0.1)
Basophils Relative: 0 %
Eosinophils Absolute: 0 10*3/uL (ref 0.0–0.7)
Eosinophils Relative: 0 %
HCT: 44.1 % (ref 36.0–46.0)
HEMOGLOBIN: 14 g/dL (ref 12.0–15.0)
LYMPHS ABS: 0.3 10*3/uL — AB (ref 0.7–4.0)
LYMPHS PCT: 5 %
MCH: 29 pg (ref 26.0–34.0)
MCHC: 31.7 g/dL (ref 30.0–36.0)
MCV: 91.5 fL (ref 78.0–100.0)
Monocytes Absolute: 0.1 10*3/uL (ref 0.1–1.0)
Monocytes Relative: 2 %
NEUTROS PCT: 93 %
Neutro Abs: 6.2 10*3/uL (ref 1.7–7.7)
PLATELETS: 365 10*3/uL (ref 150–400)
RBC: 4.82 MIL/uL (ref 3.87–5.11)
RDW: 14.4 % (ref 11.5–15.5)
WBC: 6.6 10*3/uL (ref 4.0–10.5)

## 2017-08-26 MED ORDER — ORAL CARE MOUTH RINSE
15.0000 mL | Freq: Two times a day (BID) | OROMUCOSAL | Status: DC
  Administered 2017-08-26: 15 mL via OROMUCOSAL

## 2017-08-26 MED ORDER — IPRATROPIUM-ALBUTEROL 0.5-2.5 (3) MG/3ML IN SOLN
3.0000 mL | Freq: Three times a day (TID) | RESPIRATORY_TRACT | Status: DC
  Administered 2017-08-26 – 2017-08-27 (×5): 3 mL via RESPIRATORY_TRACT
  Filled 2017-08-26 (×5): qty 3

## 2017-08-26 NOTE — Progress Notes (Signed)
PROGRESS NOTE    Pamela Mcguire  QQI:[id number] DOB: 1935/09/27 DOA: 08/24/2017 PCP: Hoyt Koch, MD    Brief Narrative:  82 year old female who presented with dyspnea. Patient does have significant past medical history of non-small cell lung cancer stage III, atrial fibrillation, hypertension, dementia, and history of tobacco abuse. Patient presents with 3 days of worsening dyspnea, to the point where she was symptomatic with minimal efforts. Associated with increase cough and mucus production. Patient has been recently placed on supplemental home oxygen, at home she uses, inhaled corticosteroid and long-acting beta 2 agonist. She presented to the hospital due to worsening symptoms. n initial physical examination, blood pressure 119/69, heart rate 92, temperature 97.6, respiratory 22, oxygen saturation 98%, or supplemental oxygen per nasal cannula. She had moist mucous membranes, decreased breath sounds bilaterally with poor air movement, heart S1-S2 irregularly regular, no rubs, gallops or murmurs, the abdomen was soft nontender, no lower extremity edema. Sodium 138, potassium 4.1, chloride 102, bicarbonate 24, glucose 98, BNP 14, creatinine 1.06, white count 7.6, hemoglobin 13.1, hematocrit 41.3, platelets 348. Chest film with right base scar/atelectasis, increase interstitial markings bilaterally, no pneumonic infiltrate. EKG with atrial fibrillation.   Patient admitted to the hospital working diagnosis of worsening dyspnea likely due to a COPD exacerbation.   Assessment & Plan:   Principal Problem:   Dyspnea Active Problems:   Adenocarcinoma of upper lobe of right lung   Cough   Atrial fibrillation with RVR (HCC)   1. COPD exacerbation. Continue aggressive bronchodilator therapy with duoneb q 6 h and q2 h as needed, plus systemic steroids (methylprednisolone 40 mg iv q12), and oral azithromycin for 5 days. Oxymetry monitoring and supplemental 02 per [place].   2. Chronic atrial  fibrillation. Rate control with diltiazem and digoxin, anticoagulation with apixaban, no signs of volume overload.  3. Hypertension. On diltiazem, blood pressure systolic   4. Dementia. No signs of agitation or confusion, continue as needed lorazepam, escitalopram, and bupropion.   5. History of lung cancer and colon cancer. Currently under observation, outpatient records, personally reviewed.   DVT prophylaxis: apixaban  Code Status: full Family Communication: No family at the bedside  Disposition Plan: home   Consultants:     Procedures:     Antimicrobials:       Subjective: Patient feeling better, dyspnea has been improving, as well as cough and mucous production, no nausea or vomiting. Positive upper abdominal pain.   Objective: Vitals:   08/26/17 0800 08/26/17 0900 08/26/17 0914 08/26/17 0919  BP: (!) 133/47 (!) 136/97    Pulse: (!) 102 (!) 108    Resp: 19 (!) 22    Temp:      TempSrc:      SpO2: 94% 94% 96% 100%  Weight:      Height:        Intake/Output Summary (Last 24 hours) at 08/26/17 1229 Last data filed at 08/26/17 1014  Gross per 24 hour  Intake              720 ml  Output                0 ml  Net              720 ml   Filed Weights   08/24/17 1924 08/26/17 0327 08/26/17 0640  Weight: 81.6 kg (180 lb) 79.4 kg (175 lb 1.6 oz) 79.4 kg (175 lb)    Examination:  General: Not in pain or dyspnea, deconditioned Neurology:  Awake and alert, non focal  E ENT: mild pallor, no icterus, oral mucosa moist Cardiovascular: No JVD. S1-S2 present, rhythmic, no gallops, rubs, or murmurs. No lower extremity edema. Pulmonary:  Decreased breath sounds bilaterally, adequate air movement, no wheezing, rhonchi, scattered rales. Gastrointestinal. Abdomen flat, no organomegaly, non tender, no rebound or guarding Skin. No rashes Musculoskeletal: no joint deformities     Data Reviewed: I have personally reviewed following labs and imaging  studies  CBC:  Recent Labs Lab 08/24/17 1805 08/25/17 0336 08/26/17 0529  WBC 7.6 5.8 6.6  NEUTROABS 5.9  --  6.2  HGB 13.1 12.5 14.0  HCT 41.3 38.7 44.1  MCV 91.8 91.1 91.5  PLT 348 297 734   Basic Metabolic Panel:  Recent Labs Lab 08/22/17 1531 08/24/17 1805 08/25/17 0336 08/26/17 0529  NA 139 138 138 136  K 4.2 4.1 3.1* 4.0  CL 101 102 101 98*  CO2 33* 24 24 26   GLUCOSE 70 98 186* 291*  BUN 20 14 9 18   CREATININE 0.89 1.06* 0.91 1.20*  CALCIUM 9.8 9.2 8.5* 9.4   GFR: Estimated Creatinine Clearance: 40.2 mL/min (A) (by C-G formula based on SCr of 1.2 mg/dL (H)). Liver Function Tests:  Recent Labs Lab 08/22/17 1531 08/25/17 0336  AST 14 22  ALT 11 19  ALKPHOS 55 46  BILITOT 0.6 1.2  PROT 7.2 6.2*  ALBUMIN 3.9 3.4*   No results for input(s): LIPASE, AMYLASE in the last 168 hours. No results for input(s): AMMONIA in the last 168 hours. Coagulation Profile: No results for input(s): INR, PROTIME in the last 168 hours. Cardiac Enzymes:  Recent Labs Lab 08/24/17 1805 08/25/17 0336 08/25/17 0836 08/25/17 1434  TROPONINI <0.03 <0.03 <0.03 <0.03   BNP (last 3 results) No results for input(s): PROBNP in the last 8760 hours. HbA1C: No results for input(s): HGBA1C in the last 72 hours. CBG: No results for input(s): GLUCAP in the last 168 hours. Lipid Profile: No results for input(s): CHOL, HDL, LDLCALC, TRIG, CHOLHDL, LDLDIRECT in the last 72 hours. Thyroid Function Tests:  Recent Labs  08/25/17 0336  TSH 3.426   Anemia Panel: No results for input(s): VITAMINB12, FOLATE, FERRITIN, TIBC, IRON, RETICCTPCT in the last 72 hours.    Radiology Studies: I have reviewed all of the imaging during this hospital visit personally     Scheduled Meds: . apixaban  5 mg Oral BID  . atorvastatin  40 mg Oral q1800  . azithromycin  500 mg Oral Daily  . buPROPion  100 mg Oral BID WC  . digoxin  0.125 mg Oral Daily  . diltiazem  240 mg Oral Daily  .  escitalopram  10 mg Oral Daily  . feeding supplement  237 mL Oral TID  . ferrous sulfate  325 mg Oral Q breakfast  . fluticasone furoate-vilanterol  1 puff Inhalation Daily  . furosemide  20 mg Oral Daily  . ipratropium-albuterol  3 mL Nebulization TID  . loratadine  10 mg Oral QHS  . mouth rinse  15 mL Mouth Rinse BID  . methylPREDNISolone (SOLU-MEDROL) injection  40 mg Intravenous Q12H  . oxybutynin  5 mg Oral BID  . pantoprazole  40 mg Oral Daily  . sodium chloride flush  3 mL Intravenous Q12H   Continuous Infusions: . sodium chloride       LOS: 0 days        Mauricio Gerome Apley, MD [facility] Pager [phone]

## 2017-08-27 LAB — BASIC METABOLIC PANEL
Anion gap: 11 (ref 5–15)
BUN: 31 mg/dL — AB (ref 6–20)
CALCIUM: 9.6 mg/dL (ref 8.9–10.3)
CO2: 26 mmol/L (ref 22–32)
CREATININE: 1.12 mg/dL — AB (ref 0.44–1.00)
Chloride: 100 mmol/L — ABNORMAL LOW (ref 101–111)
GFR calc Af Amer: 52 mL/min — ABNORMAL LOW (ref >60)
GFR, EST NON AFRICAN AMERICAN: 45 mL/min — AB (ref >60)
GLUCOSE: 248 mg/dL — AB (ref 65–99)
POTASSIUM: 4.6 mmol/L (ref 3.5–5.1)
Sodium: 137 mmol/L (ref 135–145)

## 2017-08-27 MED ORDER — IPRATROPIUM-ALBUTEROL 0.5-2.5 (3) MG/3ML IN SOLN
3.0000 mL | Freq: Four times a day (QID) | RESPIRATORY_TRACT | Status: DC | PRN
Start: 2017-08-27 — End: 2017-08-27

## 2017-08-27 MED ORDER — PREDNISONE 20 MG PO TABS: 20.0000 mg | ORAL_TABLET | Freq: Every day | ORAL | 0 refills | Status: AC

## 2017-08-27 NOTE — Plan of Care (Signed)
Problem: Education: Goal: Knowledge of [facility] Education information/materials will improve Outcome: Progressing Patient aware of plan of care.  RN provided medication education on medications administered prior to administration.  Patient stated understanding.     

## 2017-08-27 NOTE — Plan of Care (Signed)
Problem: Health Behavior/Discharge Planning: Goal: Ability to manage health-related needs will improve Outcome: Progressing POC discussed. Verbalized understanding.

## 2017-08-27 NOTE — Discharge Summary (Signed)
Physician Discharge Summary  Pamela Mcguire WER:[id number] DOB: 12/28/1934 DOA: 08/24/2017  PCP: Hoyt Koch, MD  Admit date: 08/24/2017 Discharge date: 08/27/2017  Admitted From: Home Disposition:  Home  Recommendations for Outpatient Follow-up:  1. Follow up with PCP in 1- week 2. Patient placed on prednisone 20 mg daily for 3 days 3. Prescribed Duoneb to use every 6 hours as needed for dyspnea 4. Nebulizer machine  Home Health: No Equipment/Devices: Nebulizer machine   Discharge Condition: Stable CODE STATUS: Full  Diet recommendation:  Heart healthy  Brief/Interim Summary: 82 year old female who presented with dyspnea. Patient does have a significant past medical history of non-small cell lung cancer stage III, atrial fibrillation, hypertension, dementia, and history of tobacco abuse. Patient presents with 3 days of worsening dyspnea, to the point where she was symptomatic with minimal efforts. Associated with increase cough and mucus production. Patient has been recently placed on supplemental home oxygen, at home she uses, inhaled corticosteroid and long-acting beta 2 agonist. She presented to the hospital due to worsening symptoms.On the initial physical examination, blood pressure 119/69, heart rate 92, temperature 97.6, respiratory 22, oxygen saturation 98%, on supplemental oxygen per nasal cannula. She had moist mucous membranes, decreased breath sounds bilaterally with poor air movement, heart S1-S2 irregularly irregular, no rubs, gallops or murmurs, the abdomen was soft nontender, no lower extremity edema. Sodium 138, potassium 4.1, chloride 102, bicarbonate 24, glucose 98, BUN 14, creatinine 1.06, white count 7.6, hemoglobin 13.1, hematocrit 41.3, platelets 348. Chest film with right base scar/atelectasis, increase interstitial markings bilaterally, no pneumonic infiltrate. EKG with atrial fibrillation.   Patient admitted to the hospital working diagnosis of worsening  dyspnea likely due to a COPD exacerbation.  1. COPD exacerbation. Patient was admitted to the medical ward, she was placed on a telemetry monitor, received aggressive bronchodilator therapy with DuoNeb, systemic steroids with methylprednisolone, oximetry monitoring and supplemental oxygen per nasal cannula. Patient responded well to medical therapy, she was deemed stable to be discharged September 30th. Short acting bronchodilator with DuoNeb has been added to her medical regimen,to use as needed. Patient will continue low-dose steroids for 3 more days. Patient will resume fluticasone-vilanterol,   2. Chronic atrial fibrillation. Remained well controlled with diltiazem and digoxin, anticoagulation with a apixaban.continue furosemide 20 mg daily.  3. Hypertension. Blood pressure remained well-controlled. Systolic blood pressure 195.  4. Dementia. Patient was continued on lorazepam, escitalopram and bupropion. No agitation.   5. History of lung cancer and colon cancer. Currently under observation, follow-up as an outpatient with the oncology clinic.    Discharge Diagnoses:  Principal Problem:   Dyspnea Active Problems:   Adenocarcinoma of upper lobe of right lung   Cough   Atrial fibrillation with RVR (HCC)   COPD exacerbation (HCC)    Discharge Instructions   Allergies as of 08/27/2017   No Known Allergies     Medication List    TAKE these medications   albuterol 108 (90 Base) MCG/ACT inhaler Commonly known as:  PROVENTIL HFA;VENTOLIN HFA Inhale 1 puff into the lungs every 6 (six) hours as needed for wheezing or shortness of breath. Reported on 06/01/2016   apixaban 5 MG Tabs tablet Commonly known as:  ELIQUIS Take 1 tablet by mouth twice daily. Appoint,ent needed for further refills - call cardiologist to schedule follow up visit-   atorvastatin 40 MG tablet Commonly known as:  LIPITOR TAKE 1 TABLET(40 MG) BY MOUTH DAILY What changed:  See the new instructions.   buPROPion  100  MG 12 hr tablet Commonly known as:  WELLBUTRIN SR TAKE 1 TABLET(100 MG) BY MOUTH DAILY   CALCIUM-VITAMIN D PO Take 1 tablet by mouth daily.   digoxin 0.125 MG tablet Commonly known as:  DIGOX Take 1 tablet (0.125 mg total) by mouth daily.   diltiazem 240 MG 24 hr capsule Commonly known as:  CARDIZEM CD Take 1 capsule (240 mg total) by mouth daily.   escitalopram 10 MG tablet Commonly known as:  LEXAPRO TAKE 1 TABLET(10 MG) BY MOUTH DAILY. FOLLOW-UP APPOINTMENT IS DUE   ferrous sulfate 325 (65 FE) MG tablet Take 325 mg by mouth daily with breakfast.   fluticasone furoate-vilanterol 100-25 MCG/INH Aepb Commonly known as:  BREO ELLIPTA Inhale 1 puff into the lungs daily.   furosemide 20 MG tablet Commonly known as:  LASIX Take 1 tablet (20 mg total) by mouth daily.   loratadine 10 MG tablet Commonly known as:  CLARITIN TAKE 1 TABLET BY MOUTH AT BEDTIME   LORazepam 2 MG tablet Commonly known as:  ATIVAN TAKE 1 TABLET BY MOUTH AT BEDTIME AS NEEDED FOR ANXIETY What changed:  how much to take  how to take this  when to take this  reasons to take this  additional instructions   multivitamin with minerals tablet Take 1 tablet by mouth daily. gummie   NUTRITIONAL SUPPLEMENT Liqd Take 120 mLs by mouth 3 (three) times daily. Med[employer]   oxybutynin 5 MG tablet Commonly known as:  DITROPAN TAKE 1 TABLET BY MOUTH TWICE DAILY What changed:  how much to take  how to take this  when to take this  additional instructions   oxyCODONE 5 MG immediate release tablet Commonly known as:  Oxy IR/ROXICODONE Take 0.5-1 tablets (2.5-5 mg total) by mouth every 6 (six) hours as needed for severe pain.   OXYGEN Inhale 2 L into the lungs as needed.   pantoprazole 40 MG tablet Commonly known as:  PROTONIX Take 1 tablet (40 mg total) by mouth daily.   predniSONE 20 MG tablet Commonly known as:  DELTASONE Take 1 tablet (20 mg total) by mouth daily.             Durable Medical Equipment        Start     Ordered   08/27/17 0000  DME Nebulizer machine    Question:  Patient needs a nebulizer to treat with the following condition  Answer:  COPD with acute bronchitis ([place])   08/27/17 1243       Discharge Care Instructions        Start     Ordered   08/27/17 0000  Increase activity slowly     08/27/17 1243   08/27/17 0000  Diet - low sodium heart healthy     08/27/17 1243   08/27/17 0000  Discharge instructions    Comments:  Please follow with primary care in 7 days.   08/27/17 1243   08/27/17 0000  predniSONE (DELTASONE) 20 MG tablet  Daily     08/27/17 1243   08/27/17 0000  DME Nebulizer machine    Question:  Patient needs a nebulizer to treat with the following condition  Answer:  COPD with acute bronchitis ([place])   08/27/17 1243      No Known Allergies  Consultations:     Procedures/Studies: Dg Chest 1 View  Result Date: 08/07/2017 CLINICAL DATA:  Status post right-sided thoracentesis EXAM: CHEST 1 VIEW COMPARISON:  August 07, 2017 FINDINGS: No evident pneumothorax. There  is mild scarring in the right base. There are rather minimal pleural effusions present currently. There is mild scarring in the right upper lobe. No edema or consolidation. Heart is mildly enlarged with pulmonary vascularity within normal limits. No adenopathy. There is aortic atherosclerosis. There are surgical clips the right axillary region. IMPRESSION: Scarring right upper lobe and right base. Small pleural effusions bilaterally. No pneumothorax. Stable cardiac prominence. There is aortic atherosclerosis. Aortic Atherosclerosis (ICD10-I70.0). Electronically Signed   By: Lowella Grip III M.D.   On: 08/07/2017 12:07   Dg Chest 1 View  Result Date: 08/07/2017 CLINICAL DATA:  Chest pain in shortness of breath. History of hypertension, breast and lung cancer. EXAM: CHEST 1 VIEW COMPARISON:  CT chest June 04, 2017 and multiple priors. FINDINGS: The cardiac  silhouette is mildly enlarged and unchanged. Calcified aortic knob. Moderate RIGHT pleural effusion, no residual LEFT pleural effusion by portable radiography. Patchy RIGHT upper lobe airspace opacity. No pneumothorax. Surgical clips project in RIGHT axilla. Osseous structures are nonsuspicious. IMPRESSION: Moderate RIGHT and no residual LEFT pleural effusions. Patchy RIGHT upper lobe airspace opacity previously attributed to postradiation change. Mild cardiomegaly and chronic interstitial changes. Electronically Signed   By: Elon Alas M.D.   On: 08/07/2017 06:50   Dg Chest 2 View  Result Date: 08/24/2017 CLINICAL DATA:  Chest pain and cough EXAM: CHEST  2 VIEW COMPARISON:  08/09/2017 FINDINGS: The heart size and mediastinal contours are within normal limits. Atherosclerotic calcifications are noted at the aortic arch. There is volume loss of the right lung with tenting of the inferior pulmonary ligament and scarring in the right upper lobe. This appears chronic. Fine interstitial prominence is seen bilaterally of the lungs without pneumonic consolidation. Stable small right posterior pleural effusion. Axillary clips are seen on the right. No acute nor suspicious osseous abnormalities. The visualized skeletal structures are unremarkable. IMPRESSION: 1. Chronic volume loss on the right with tenting of the inferior pulmonary ligament and scarring at the right lung apex. 2. Fine interstitial prominence of the lungs may reflect fibrosis. 3. Chronic stable small posterior pleural effusion. Electronically Signed   By: Ashley Royalty M.D.   On: 08/24/2017 19:15   Dg Chest 2 View  Result Date: 08/09/2017 CLINICAL DATA:  82 year old female with a history of pleural effusion. Shortness of breath EXAM: CHEST  2 VIEW COMPARISON:  08/07/2017, ultrasound thoracentesis 08/07/2017, prior CT 08/05/2017, chest x-ray 09/08/2016 FINDINGS: Cardiomediastinal silhouette unchanged. Calcifications of the aortic arch. Opacity at  the right lung base with partial obscuration of the right hemidiaphragm. Peak appearance of the diaphragm with up lifting of the minor fissure. No pneumothorax. Ill-defined airspace and interstitial opacity at the apex of the right lung, particularly along the mediastinum. Compared to the most recent plain film there is similar appearance at the right lung base, with the lateral view demonstrating blunting of the costophrenic sulcus. Coarsened interstitial markings, similar to prior with mild interlobular septal thickening. IMPRESSION: Significantly decreased pleural effusion from the plain film of 08/07/2017, with small persisting right pleural effusion. No pneumothorax. Peak appearance of the right diaphragm, potentially combination of right upper lobe volume loss, treatment changes, scarring/atelectasis, and/ or consolidation. Right upper lobe volume loss, potentially a combination of treatment changes and/or consolidation. This is relatively unchanged when compared to the plain film of 09/08/2016. Electronically Signed   By: Corrie Mckusick D.O.   On: 08/09/2017 09:35   Dg Chest 2 View  Result Date: 08/05/2017 CLINICAL DATA:  Right chest pain. History of  lung cancer breast cancer colon cancer EXAM: CHEST  2 VIEW COMPARISON:  CT chest 07/04/2017.  Chest x-ray 09/08/2016 FINDINGS: Right upper lobe density most compatible with post radiation scarring similar to the prior CT. Associated pleural thickening in the right apex also unchanged. Elevated right hemidiaphragm with right lower lobe atelectasis. Left lung clear Heart size upper normal.  Negative for heart failure or edema. IMPRESSION: Chronic scarring right upper lobe related to prior cancer treatment. No superimposed acute abnormality. Electronically Signed   By: Franchot Gallo M.D.   On: 08/05/2017 13:56   Ct Angio Chest Pe W/cm &/or Wo Cm  Result Date: 08/24/2017 CLINICAL DATA:  82 y/o F; tachypnea and hypoxia, suspect pulmonary embolus. History of  lung cancer post radiation treatment. EXAM: CT ANGIOGRAPHY CHEST WITH CONTRAST TECHNIQUE: Multidetector CT imaging of the chest was performed using the standard protocol during bolus administration of intravenous contrast. Multiplanar CT image reconstructions and MIPs were obtained to evaluate the vascular anatomy. CONTRAST:  69 cc Isovue 370 COMPARISON:  08/05/2017 CTA chest.  12/22/2016 PET-CT. FINDINGS: Cardiovascular: Normal heart size. No pericardial effusion. Moderate coronary artery calcification. Aortic valvular and mitral annular calcifications can be associated with valvular dysfunction. Normal caliber thoracic aorta with moderate calcific atherosclerosis. Satisfactory opacification of the pulmonary arteries. No pulmonary embolus identified. Mediastinum/Nodes: Stable right lower paratracheal, right upper peritracheal, and carinal lymphadenopathy. Decreased size of prevascular lymph nodes. Lungs/Pleura: Diminution in pleural effusions with small residual right-sided pleural effusion. Interlobular septal thickening is present although decreased when compared to prior CT of the chest compatible with interstitial edema. Additionally, there are fine reticular opacities at periphery of the lungs likely representing component of pulmonary fibrosis. There are numerous small (3-5 mm) peripheral pulmonary nodules greatest in the lung bases and there is a stable nodule in the right lower lobe measuring 15 x 8 mm (series 7, image 63) which demonstrated FDG uptake on prior PET-CT, attention at follow-up is recommended. The dense radiation fibrosis in the right upper lobe extending into the right hilum with atelectatic lung, vascular pruning, in confluent soft tissue. Evaluation for underlying neoplasm is best assessed with PET-CT. Upper Abdomen: Stable 9 mm left adrenal nodule.  Cholecystectomy. Musculoskeletal: Stable moderate L1 compression deformity. No new acute osseous abnormality is identified. Review of the MIP  images confirms the above findings. IMPRESSION: 1. No pulmonary embolus identified. 2. Interval decrease in pleural effusions with small residual right-sided pleural effusion. 3. Interstitial pulmonary edema, decreased compared with prior CTA. 4. Radiation fibrosis changes/treated neoplasm in the right upper lobe extending into the right hilum are stable from prior CTA. 5. Stable right lower and upper paratracheal as well as sub- carinal lymphadenopathy, attention at follow-up recommended. Decreased size of prevascular lymph nodes probably due to decrease in pulmonary vascular congestion. 6. Several small peripheral pulmonary nodules and 15 mm right lower lobe pulmonary nodule are stable. 7. Stable subcentimeter left adrenal nodule. 8. Stable moderate L1 compression deformity. 9. Moderate coronary artery and aortic calcific atherosclerosis. Electronically Signed   By: Kristine Garbe M.D.   On: 08/24/2017 22:37   Ct Angio Chest Pe W And/or Wo Contrast  Result Date: 08/05/2017 CLINICAL DATA:  Patient with history of lung, breast and colon cancer. Chest pain. Shortness of breath. Evaluate for PE. EXAM: CT ANGIOGRAPHY CHEST WITH CONTRAST TECHNIQUE: Multidetector CT imaging of the chest was performed using the standard protocol during bolus administration of intravenous contrast. Multiplanar CT image reconstructions and MIPs were obtained to evaluate the vascular anatomy. CONTRAST:  100 cc Isovue 370 COMPARISON:  CT CAP 07/04/2017 FINDINGS: Cardiovascular: Heart is enlarged. No pericardial effusion. Coronary arterial vascular calcifications. Adequate opacification of the pulmonary arterial system. Motion artifact limits evaluation. No evidence for acute pulmonary embolus. Mediastinum/Nodes: Right axillary surgical clips. No left axillary adenopathy. Interval development of a 1.3 cm prevascular lymph node (image 30; series 4). Lungs/Pleura: Expiratory phase of imaging. Interval development of large right and  moderate left layering pleural effusions. New bilateral patchy ground-glass pulmonary opacities and interlobular septal thickening. Right mid and upper lung paramediastinal post radiation changes, largely obscured due to large right pleural effusion. Re- demonstrated 10 mm right lower lobe pulmonary nodule (image 58; series 11). Upper Abdomen: No acute process. Musculoskeletal: Thoracic spine degenerative changes. No aggressive or acute appearing osseous lesions. Review of the MIP images confirms the above findings. IMPRESSION: 1. No evidence for acute pulmonary embolus. 2. Interval development of large right and moderate left pleural effusions resulting in subpleural consolidative opacities within the bilateral lungs. 3. New bilateral ground-glass pulmonary opacities and interlobular septal thickening most suggestive of pulmonary edema. 4. Difficult to assess/evaluate right paramediastinal post radiation changes given distortion from large right pleural effusion. 5. Aortic atherosclerosis. 6. Interval development of mildly enlarged prevascular lymph node. Recommend attention on follow-up. Electronically Signed   By: Lovey Newcomer M.D.   On: 08/05/2017 16:00   US Thoracentesis Asp Pleural Space W/img Guide  Result Date: 08/07/2017 INDICATION: Patient with history of breast, colon and lung cancers. Bilateral pleural effusions ,right greater than left. Request made for diagnostic and therapeutic right thoracentesis. EXAM: ULTRASOUND GUIDED DIAGNOSTIC AND THERAPEUTIC RIGHT THORACENTESIS MEDICATIONS: None. COMPLICATIONS: None immediate. PROCEDURE: An ultrasound guided thoracentesis was thoroughly discussed with the patient and questions answered. The benefits, risks, alternatives and complications were also discussed. The patient understands and wishes to proceed with the procedure. Written consent was obtained. Ultrasound was performed to localize and mark an adequate pocket of fluid in the right chest. The area was  then prepped and draped in the normal sterile fashion. 1% Lidocaine was used for local anesthesia. Under ultrasound guidance a Safe-T-Centesis catheter was introduced. Thoracentesis was performed. The catheter was removed and a dressing applied. FINDINGS: A total of approximately 1.3 liters of slightly hazy, yellow fluid was removed. Samples were sent to the laboratory as requested by the clinical team. IMPRESSION: Successful ultrasound guided diagnostic and therapeutic right thoracentesis yielding 1.3 liters of pleural fluid. Read by: Rowe Robert, PA-C Electronically Signed   By: Aletta Edouard M.D.   On: 08/07/2017 13:10       Subjective: Patient feeling better, dyspnea continue to improve, no chest pain, no nausea or vomiting, tolerating po well.   Discharge Exam: Vitals:   08/27/17 0909 08/27/17 0930  BP:  (!) 110/45  Pulse:  89  Resp:  (!) 31  Temp:    SpO2: 91% 94%   Vitals:   08/27/17 0800 08/27/17 0908 08/27/17 0909 08/27/17 0930  BP:    (!) 110/45  Pulse: 77 84  89  Resp: 16 18  (!) 31  Temp:      TempSrc:      SpO2: 99%  91% 94%  Weight:      Height:        General: Pt is alert, awake, not in acute distress E ENT. Mild pallor, no icterus, oral mucosa moist Cardiovascular: Irregulary irregular, S1/S2 +, no rubs, no gallops Respiratory: CTA bilaterally, no wheezing, no rhonchi, scattered rales.  Abdominal: Soft, NT, ND, bowel  sounds + Extremities: no edema, no cyanosis    The results of significant diagnostics from this hospitalization (including imaging, microbiology, ancillary and laboratory) are listed below for reference.     Microbiology: No results found for this or any previous visit (from the past 240 hour(s)).   Labs: BNP (last 3 results)  Recent Labs  08/05/17 1835 08/07/17 0148 08/24/17 1805  BNP 530.2* 236.3* 812.7*   Basic Metabolic Panel:  Recent Labs Lab 08/22/17 1531 08/24/17 1805 08/25/17 0336 08/26/17 0529 08/27/17 0513  NA  139 138 138 136 137  K 4.2 4.1 3.1* 4.0 4.6  CL 101 102 101 98* 100*  CO2 33* 24 24 26 26   GLUCOSE 70 98 186* 291* 248*  BUN 20 14 9 18  31*  CREATININE 0.89 1.06* 0.91 1.20* 1.12*  CALCIUM 9.8 9.2 8.5* 9.4 9.6   Liver Function Tests:  Recent Labs Lab 08/22/17 1531 08/25/17 0336  AST 14 22  ALT 11 19  ALKPHOS 55 46  BILITOT 0.6 1.2  PROT 7.2 6.2*  ALBUMIN 3.9 3.4*   No results for input(s): LIPASE, AMYLASE in the last 168 hours. No results for input(s): AMMONIA in the last 168 hours. CBC:  Recent Labs Lab 08/24/17 1805 08/25/17 0336 08/26/17 0529  WBC 7.6 5.8 6.6  NEUTROABS 5.9  --  6.2  HGB 13.1 12.5 14.0  HCT 41.3 38.7 44.1  MCV 91.8 91.1 91.5  PLT 348 297 365   Cardiac Enzymes:  Recent Labs Lab 08/24/17 1805 08/25/17 0336 08/25/17 0836 08/25/17 1434  TROPONINI <0.03 <0.03 <0.03 <0.03   BNP: Invalid input(s): POCBNP CBG: No results for input(s): GLUCAP in the last 168 hours. D-Dimer No results for input(s): DDIMER in the last 72 hours. Hgb A1c No results for input(s): HGBA1C in the last 72 hours. Lipid Profile No results for input(s): CHOL, HDL, LDLCALC, TRIG, CHOLHDL, LDLDIRECT in the last 72 hours. Thyroid function studies  Recent Labs  08/25/17 0336  TSH 3.426   Anemia work up No results for input(s): VITAMINB12, FOLATE, FERRITIN, TIBC, IRON, RETICCTPCT in the last 72 hours. Urinalysis    Component Value Date/Time   COLORURINE YELLOW 08/07/2017 1213   APPEARANCEUR CLEAR 08/07/2017 1213   LABSPEC 1.012 08/07/2017 1213   LABSPEC 1.010 06/02/2015 1706   PHURINE 6.0 08/07/2017 1213   GLUCOSEU NEGATIVE 08/07/2017 1213   GLUCOSEU NEGATIVE 07/10/2017 1633   GLUCOSEU Negative 06/02/2015 1706   HGBUR NEGATIVE 08/07/2017 1213   BILIRUBINUR NEGATIVE 08/07/2017 1213   BILIRUBINUR 3+      4mg /dl 09/08/2016 1133   KETONESUR NEGATIVE 08/07/2017 1213   PROTEINUR NEGATIVE 08/07/2017 1213   UROBILINOGEN 0.2 07/10/2017 1633   UROBILINOGEN 0.2  06/02/2015 1706   NITRITE NEGATIVE 08/07/2017 1213   LEUKOCYTESUR TRACE (A) 08/07/2017 1213   LEUKOCYTESUR Moderate 06/02/2015 1706   Sepsis Labs Invalid input(s): PROCALCITONIN,  WBC,  LACTICIDVEN Microbiology No results found for this or any previous visit (from the past 240 hour(s)).   Time coordinating discharge: 45 minutes  SIGNED:   Tawni Millers, MD  [facility] 08/27/2017, 12:22 PM Pager [phone]  If 7PM-7AM, please contact night-coverage [email or web address] Password [email or web address]

## 2017-08-28 ENCOUNTER — Telehealth: Payer: Self-pay | Admitting: *Deleted

## 2017-08-28 NOTE — Telephone Encounter (Signed)
Called pt/daughter (Mrs. Pamela Mcguire) to set-up [facility] f/u appt no answer LMOM RTC...[name]

## 2017-08-29 NOTE — Telephone Encounter (Signed)
Called pt/daughter again still no answer LMOM pls call back to make hosp f/u...[name]

## 2017-08-30 NOTE — Telephone Encounter (Signed)
Called daughter again still no answer or call a bck. Per chart appt has been made for transfer from [facility] to Dr. Sharlet Salina for 09/21/17...Pamela Mcguire

## 2017-09-05 ENCOUNTER — Other Ambulatory Visit: Payer: Self-pay | Admitting: Internal Medicine

## 2017-09-05 ENCOUNTER — Other Ambulatory Visit: Payer: Self-pay | Admitting: Family

## 2017-09-05 DIAGNOSIS — F329 Major depressive disorder, single episode, unspecified: Secondary | ICD-10-CM

## 2017-09-05 DIAGNOSIS — F32A Depression, unspecified: Secondary | ICD-10-CM

## 2017-09-12 ENCOUNTER — Other Ambulatory Visit: Payer: Self-pay

## 2017-09-12 ENCOUNTER — Other Ambulatory Visit: Payer: Self-pay | Admitting: *Deleted

## 2017-09-12 MED ORDER — DILTIAZEM HCL ER COATED BEADS 240 MG PO CP24: 240.0000 mg | ORAL_CAPSULE | Freq: Every day | ORAL | 3 refills | Status: DC

## 2017-09-12 MED ORDER — FUROSEMIDE 20 MG PO TABS: 20.0000 mg | ORAL_TABLET | Freq: Every day | ORAL | 3 refills | Status: DC

## 2017-09-12 MED ORDER — FUROSEMIDE 20 MG PO TABS: 20.0000 mg | ORAL_TABLET | Freq: Every day | ORAL | 0 refills | Status: DC

## 2017-09-19 ENCOUNTER — Encounter: Payer: Self-pay | Admitting: Internal Medicine

## 2017-09-19 ENCOUNTER — Ambulatory Visit (INDEPENDENT_AMBULATORY_CARE_PROVIDER_SITE_OTHER)
Admission: RE | Admit: 2017-09-19 | Discharge: 2017-09-19 | Disposition: A | Discharge status: Home / Self Care | Payer: Medicare HMO | Source: Ambulatory Visit | Attending: Internal Medicine | Admitting: Internal Medicine

## 2017-09-19 ENCOUNTER — Ambulatory Visit (INDEPENDENT_AMBULATORY_CARE_PROVIDER_SITE_OTHER): Payer: Medicare HMO | Admitting: Internal Medicine

## 2017-09-19 VITALS — BP 138/78 | HR 83 | Temp 98.5°F | Ht 66.0 in | Wt 183.0 lb

## 2017-09-19 DIAGNOSIS — F321 Major depressive disorder, single episode, moderate: Secondary | ICD-10-CM | POA: Diagnosis not present

## 2017-09-19 DIAGNOSIS — J90 Pleural effusion, not elsewhere classified: Secondary | ICD-10-CM

## 2017-09-19 DIAGNOSIS — Z7409 Other reduced mobility: Secondary | ICD-10-CM

## 2017-09-19 DIAGNOSIS — R531 Weakness: Secondary | ICD-10-CM

## 2017-09-19 DIAGNOSIS — J449 Chronic obstructive pulmonary disease, unspecified: Secondary | ICD-10-CM

## 2017-09-19 DIAGNOSIS — J441 Chronic obstructive pulmonary disease with (acute) exacerbation: Secondary | ICD-10-CM | POA: Diagnosis not present

## 2017-09-19 DIAGNOSIS — I480 Paroxysmal atrial fibrillation: Secondary | ICD-10-CM | POA: Diagnosis not present

## 2017-09-19 DIAGNOSIS — C3490 Malignant neoplasm of unspecified part of unspecified bronchus or lung: Secondary | ICD-10-CM | POA: Diagnosis not present

## 2017-09-19 DIAGNOSIS — C3411 Malignant neoplasm of upper lobe, right bronchus or lung: Secondary | ICD-10-CM

## 2017-09-19 MED ORDER — IPRATROPIUM-ALBUTEROL 0.5-2.5 (3) MG/3ML IN SOLN: 3.0000 mL | Freq: Four times a day (QID) | RESPIRATORY_TRACT | 11 refills | Status: AC | PRN

## 2017-09-19 MED ORDER — MIRTAZAPINE 15 MG PO TABS: 15.0000 mg | ORAL_TABLET | Freq: Every day | ORAL | 0 refills | Status: DC

## 2017-09-19 NOTE — Progress Notes (Signed)
   Subjective:    Patient ID: Pamela Mcguire, female    DOB: 1935/09/24, 82 y.o.   MRN: [id number]  HPI The patient is an 82 YO female coming in for hospital follow up (in for COPD exacerbation, treated with steroids, duonebs, on home oxygen which was maintained). She does not feel much better since discharge. She is not able to care for herself and needs full assistance. She is able to use walker for short distances but wheelchair for any length. She is tired and is not able to do much right now. She has done a lot of PT in the past and does not want to do that now as she does not have the energy and breathing is poor. She is not able to use the inhalers well at home. Her daughter states most medicine is getting into the air as she tries to use them. She does have chronic medical conditions as well which need addressing including her depression (very uncontrolled right now due to her poor health, she is taking wellbutrin and lexapro and still very tearful and not sleeping well) and her lung cancer (she is currently under active surveillance for cancer but not undergoing treatment, this could change based on any progression of her cancer, recent CT without significant changes to her lesions).   PMH, [facility], social history reviewed and updated  Review of Systems  Constitutional: Positive for activity change, appetite change and fatigue. Negative for chills, fever and unexpected weight change.  HENT: Negative.   Eyes: Negative.   Respiratory: Positive for cough and shortness of breath. Negative for chest tightness and wheezing.   Cardiovascular: Negative for chest pain, palpitations and leg swelling.  Gastrointestinal: Negative for abdominal distention, abdominal pain, constipation, diarrhea, nausea and vomiting.  Musculoskeletal: Positive for arthralgias, gait problem and myalgias. Negative for joint swelling.  Skin: Negative.   Neurological: Positive for weakness. Negative for dizziness, facial asymmetry  and headaches.  Psychiatric/Behavioral: Positive for decreased concentration, dysphoric mood and sleep disturbance. Negative for self-injury and suicidal ideas. The patient is nervous/anxious.       Objective:   Physical Exam  Constitutional: She is oriented to person, place, and time. She appears well-developed. She appears distressed.  Mild respiratory distress  HENT:  Head: Normocephalic and atraumatic.  Right Ear: External ear normal.  Left Ear: External ear normal.  Eyes: EOM are normal.  Neck: Normal range of motion.  Cardiovascular: Normal rate and regular rhythm.   Pulmonary/Chest: She is in respiratory distress. She has wheezes. She has no rales.  Effort normal, mild respiratory distress with tachypnea with talking, some wheezing and rhonchi bilaterally  Abdominal: Soft.  Musculoskeletal: She exhibits no edema.  Neurological: She is alert and oriented to person, place, and time.  Skin: Skin is warm and dry.  Psychiatric:  Tearful during the visit   Vitals:   09/19/17 1302  BP: 138/78  Pulse: 83  Temp: 98.5 F (36.9 C)  TempSrc: Oral  SpO2: 97%  Weight: 183 lb (83 kg)  Height: 5\' 6"  (1.676 m)      Assessment & Plan:

## 2017-09-19 NOTE — Patient Instructions (Signed)
We have stopped the wellbutrin today.   We will continue the lexapro and will add remeron (mirtazepine) to take 1 pill at night time.   We will have a nebulizer machine sent to you and shown how to use. Try to use it twice a day for breathing everyday. If you need to use it more you can use it up to 4 times per day.   We are checking a chest x-ray today and will get you in with pulmonary as soon as possible to make sure we are doing the right things for your lungs.

## 2017-09-22 ENCOUNTER — Encounter: Payer: Self-pay | Admitting: Internal Medicine

## 2017-09-22 NOTE — Assessment & Plan Note (Signed)
Using wheelchair for any length, walker at home for short distances.

## 2017-09-22 NOTE — Assessment & Plan Note (Signed)
Not controlled on current medications. Stop wellbutrin and add remeron for better control and continue lexapro.

## 2017-09-22 NOTE — Assessment & Plan Note (Signed)
She will continue active surveillance with the intent for palliative treatment with any progression.

## 2017-09-22 NOTE — Assessment & Plan Note (Signed)
In sinus today. Still feeling very fatigued and it is unclear if this is related.

## 2017-09-22 NOTE — Assessment & Plan Note (Signed)
Does walk with walker around the house but mobility is severely limited. She uses wheelchair for any distance and leaving the house.

## 2017-09-22 NOTE — Assessment & Plan Note (Signed)
No more steroids today as they were not effective. She does have fibrosis on CT which is probably more related to her breathing problems. Rx for nebulizer as this will be easier for her to use and get medication.

## 2017-09-22 NOTE — Assessment & Plan Note (Signed)
Checking CXR today for stability. Lung exam with some findings. Referral to pulmonary to see if we can optimize QOL and treatment. Rx for nebulizer today and duonebs to use BID scheduled and up to QID prn.

## 2017-09-25 ENCOUNTER — Other Ambulatory Visit: Payer: Self-pay

## 2017-09-25 MED ORDER — LORATADINE 10 MG PO TABS: 10.0000 mg | ORAL_TABLET | Freq: Every day | ORAL | 3 refills | Status: DC

## 2017-10-05 ENCOUNTER — Ambulatory Visit: Payer: Medicare HMO | Admitting: Pulmonary Disease

## 2017-10-05 ENCOUNTER — Encounter: Payer: Self-pay | Admitting: Pulmonary Disease

## 2017-10-05 VITALS — BP 124/82 | HR 82 | Ht 66.0 in | Wt 183.0 lb

## 2017-10-05 DIAGNOSIS — J90 Pleural effusion, not elsewhere classified: Secondary | ICD-10-CM | POA: Diagnosis not present

## 2017-10-05 DIAGNOSIS — J441 Chronic obstructive pulmonary disease with (acute) exacerbation: Secondary | ICD-10-CM

## 2017-10-05 DIAGNOSIS — R0602 Shortness of breath: Secondary | ICD-10-CM | POA: Diagnosis not present

## 2017-10-05 DIAGNOSIS — G4733 Obstructive sleep apnea (adult) (pediatric): Secondary | ICD-10-CM

## 2017-10-05 DIAGNOSIS — R918 Other nonspecific abnormal finding of lung field: Secondary | ICD-10-CM | POA: Diagnosis not present

## 2017-10-05 NOTE — Assessment & Plan Note (Signed)
refused CPAP

## 2017-10-05 NOTE — Patient Instructions (Addendum)
You do not need more breathing medications. Stay on Breo once daily. Use albuterol nebs every 6 hours as needed for increased shortness of breath or wheezing

## 2017-10-05 NOTE — Assessment & Plan Note (Signed)
Appears transudative?  Related to diastolic heart failure

## 2017-10-05 NOTE — Assessment & Plan Note (Signed)
Being followed by oncology with no evidence of worsening on last CT scan, hypermetabolic right lower lobe nodule has been stable. At any rate she is not a candidate for further treatment

## 2017-10-05 NOTE — Assessment & Plan Note (Signed)
Surprisingly does not seem to have significant airway obstruction or COPD  do not need more breathing medications. Stay on Breo once daily-only because this is symptomatically helping Use albuterol nebs every 6 hours as needed for increased shortness of breath or wheezing  I think she would do better with a physical therapy program and other than medication

## 2017-10-05 NOTE — Progress Notes (Signed)
Subjective:    Patient ID: Pamela Mcguire, female    DOB: 1935-05-08, 82 y.o.   MRN: [id number]  HPI  Chief Complaint  Patient presents with  . Pulm Consult    Has history of lung cancer in right lung. Also has a history of breast cancer on right side. States that her breathing has become labored within the past few months.     82 year old ex-smoker presents for evaluation of dyspnea and dizziness. Reason for consult listed by referring physician is lung mass, liver patient is accompanied by her daughter today who states that this mass has been followed by oncology.  Review of her chart indicates a diagnosis of stage III non-small cell lung cancer, adenocarcinoma in 2016 status post concurrent chemoradiation with partial response.  She was not felt to be a good candidate for consolidation chemotherapy.  She has been followed by serial imaging, last CT scan 07/2017 has shown stable right lower lobe pulmonary nodule 15 x 8 mm which was noted to be hypermetabolic on prior PET scan.  She also underwent partial colectomy in 2018 for stage II colon adenocarcinoma. Iron Deficiency anemia is being treated.  She has a remote history of breast cancer  She was hospitalized 07/2017 for dyspnea and again 08/24/17 for acute pulmonary edema. She underwent right-sided thoracentesis on 9/10 with removal of 1.3 L of transudate of fluid with negative cytology echo showed normal LV function. Chest x-ray from 10/23 does not show any infiltrates or effusions.  Extensive right upper lobe consolidation has been noted after attributed to radiation fibrosis.   She is maintained on oxygen for the last 2 years but is poorly compliant with this.  She has just moved into independent living, arrives in a wheelchair today, uses a walker normally.  She reports dizziness frequently including today while sitting with her oxygen saturation of 96%.  She reports significant dyspnea on walking a few steps.  She also seems to have  memory issues.  She smoked more than 40 pack years before she quit in 1998.  She is maintained on a regimen of Breo and albuterol OSA was diagnosed in 2015 but she refused her CPAP machine   Spirometry surprisingly shows ratio of 82, FEV1 of 77% and FVC of 70% more indicative of moderate restriction, no evidence of airway obstruction   Past Medical History:  Diagnosis Date  . AF (atrial fibrillation) ([place])   . Anxiety   . Arthritis    "thighs, hips, arms" (04/28/2015)  . Breast cancer ([place])    right mastectomy  . CAD (coronary artery disease)   . Dehydration 12/06/2016  . Dementia   . Depression   . Diverticulitis   . GERD (gastroesophageal reflux disease)   . HCAP (healthcare-associated pneumonia) 09/15/2015  . Hyperlipidemia   . Hypertension   . Non-small cell carcinoma of lung, stage 3 ([place]) 04/30/2015  . Prediabetes   . Seasonal allergies   . Shortness of breath dyspnea   . Sleep apnea     dx'd 10/2014,refused cpap machine  . Urinary frequency   . UTI (urinary tract infection) 06/06/2015    Past Surgical History:  Procedure Laterality Date  . BREAST SURGERY    . CARDIOVERSION    . Cataract surgery    . CESAREAN SECTION  X 2  . COLON SURGERY    . COLONOSCOPY    . MASTECTOMY Right   . TONSILLECTOMY       No Known Allergies   Social History  Socioeconomic History  . Marital status: Widowed    Spouse name: Not on file  . Number of children: 4  . Years of education: 28  . Highest education level: Not on file  Social Needs  . Financial resource strain: Not on file  . Food insecurity - worry: Not on file  . Food insecurity - inability: Not on file  . Transportation needs - medical: Not on file  . Transportation needs - non-medical: Not on file  Occupational History  . Occupation: retired  Tobacco Use  . Smoking status: Former Smoker    Packs/day: 1.00    Years: 35.00    Pack years: 35.00    Types: Cigarettes    Last attempt to quit: 11/28/1992    Years  since quitting: 24.8  . Smokeless tobacco: Never Used  . Tobacco comment: "stopped smoking at age 77"  Substance and Sexual Activity  . Alcohol use: Yes    Alcohol/week: 5.4 oz    Types: 9 Glasses of wine per week    Comment: 04/28/2015 "drink 3, 6oz,  glasses of wine on Sat & Sun"  . Drug use: No  . Sexual activity: No  Other Topics Concern  . Not on file  Social History Narrative   Fun: Word search and read   Denies abuse and feels safe at home     Family History  Problem Relation Age of Onset  . Mental illness Mother      Review of Systems Positive for shortness of breath with activity, productive cough, irregular heartbeat, indigestion, anxiety and depression  Constitutional: negative for anorexia, fevers and sweats  Eyes: negative for irritation, redness and visual disturbance  Ears, nose, mouth, throat, and face: negative for earaches, epistaxis, nasal congestion and sore throat  Respiratory: negative for cough, dyspnea on exertion, sputum and wheezing  Cardiovascular: negative for chest pain, dyspnea, lower extremity edema, orthopnea, palpitations and syncope  Gastrointestinal: negative for abdominal pain, constipation, diarrhea, melena, nausea and vomiting  Genitourinary:negative for dysuria, frequency and hematuria  Hematologic/lymphatic: negative for bleeding, easy bruising and lymphadenopathy  Musculoskeletal:negative for arthralgias, muscle weakness and stiff joints  Neurological: negative for coordination problems, gait problems, headaches and weakness  Endocrine: negative for diabetic symptoms including polydipsia, polyuria and weight loss     Objective:   Physical Exam   Gen. Pleasant, well-nourished, in no distress, depressed , confused affect ENT - no lesions, no post nasal drip Neck: No JVD, no thyromegaly, no carotid bruits Lungs: no use of accessory muscles, no dullness to percussion, clear without rales or rhonchi  Cardiovascular: Rhythm regular,  heart sounds  normal, no murmurs or gallops, no peripheral edema Abdomen: soft and non-tender, no hepatosplenomegaly, BS normal. Musculoskeletal: No deformities, no cyanosis or clubbing Neuro:  alert, non focal        Assessment & Plan:

## 2017-10-08 ENCOUNTER — Other Ambulatory Visit: Payer: Self-pay | Admitting: Internal Medicine

## 2017-10-09 ENCOUNTER — Other Ambulatory Visit: Payer: Self-pay | Admitting: Internal Medicine

## 2017-10-09 DIAGNOSIS — F329 Major depressive disorder, single episode, unspecified: Secondary | ICD-10-CM

## 2017-10-09 DIAGNOSIS — F32A Depression, unspecified: Secondary | ICD-10-CM

## 2017-10-10 ENCOUNTER — Other Ambulatory Visit: Payer: Self-pay | Admitting: Adult Health

## 2017-10-28 ENCOUNTER — Other Ambulatory Visit: Payer: Self-pay | Admitting: Internal Medicine

## 2017-10-28 DIAGNOSIS — F32A Depression, unspecified: Secondary | ICD-10-CM

## 2017-10-28 DIAGNOSIS — F329 Major depressive disorder, single episode, unspecified: Secondary | ICD-10-CM

## 2017-11-03 ENCOUNTER — Other Ambulatory Visit: Payer: Self-pay | Admitting: Internal Medicine

## 2017-11-03 DIAGNOSIS — F32A Depression, unspecified: Secondary | ICD-10-CM

## 2017-11-03 DIAGNOSIS — F329 Major depressive disorder, single episode, unspecified: Secondary | ICD-10-CM

## 2017-11-11 ENCOUNTER — Other Ambulatory Visit: Payer: Self-pay | Admitting: Adult Health

## 2017-11-14 ENCOUNTER — Other Ambulatory Visit: Payer: Self-pay | Admitting: Internal Medicine

## 2017-12-12 ENCOUNTER — Telehealth: Payer: Self-pay | Admitting: Internal Medicine

## 2017-12-12 MED ORDER — MIRTAZAPINE 15 MG PO TABS: 15.0000 mg | ORAL_TABLET | Freq: Every day | ORAL | 0 refills | Status: DC

## 2017-12-12 NOTE — Telephone Encounter (Signed)
Copied from [place] [id number]. Topic: Quick Communication - Rx Refill/Question >> Dec 12, 2017 11:46 AM Ether Griffins B wrote: Medication: Remeron   Has the patient contacted their pharmacy? Yes.     (Agent: If no, request that the patient contact the pharmacy for the refill.)   Preferred Pharmacy (with phone number or street name): [id number] - [place], [place]: Please be advised that RX refills may take up to 3 business days. We ask that you follow-up with your pharmacy.

## 2018-01-02 ENCOUNTER — Inpatient Hospital Stay: Payer: Medicare HMO | Attending: Internal Medicine

## 2018-01-02 ENCOUNTER — Ambulatory Visit (HOSPITAL_COMMUNITY)
Admission: RE | Admit: 2018-01-02 | Discharge: 2018-01-02 | Disposition: A | Discharge status: Home / Self Care | Payer: Medicare HMO | Source: Ambulatory Visit | Attending: Internal Medicine | Admitting: Internal Medicine

## 2018-01-02 ENCOUNTER — Encounter (HOSPITAL_COMMUNITY): Payer: Self-pay

## 2018-01-02 DIAGNOSIS — R5383 Other fatigue: Secondary | ICD-10-CM | POA: Insufficient documentation

## 2018-01-02 DIAGNOSIS — I700 Atherosclerosis of aorta: Secondary | ICD-10-CM | POA: Diagnosis not present

## 2018-01-02 DIAGNOSIS — J439 Emphysema, unspecified: Secondary | ICD-10-CM | POA: Insufficient documentation

## 2018-01-02 DIAGNOSIS — C182 Malignant neoplasm of ascending colon: Secondary | ICD-10-CM

## 2018-01-02 DIAGNOSIS — C3411 Malignant neoplasm of upper lobe, right bronchus or lung: Secondary | ICD-10-CM | POA: Diagnosis present

## 2018-01-02 DIAGNOSIS — F32A Depression, unspecified: Secondary | ICD-10-CM

## 2018-01-02 DIAGNOSIS — F329 Major depressive disorder, single episode, unspecified: Secondary | ICD-10-CM

## 2018-01-02 DIAGNOSIS — M4856XA Collapsed vertebra, not elsewhere classified, lumbar region, initial encounter for fracture: Secondary | ICD-10-CM | POA: Insufficient documentation

## 2018-01-02 DIAGNOSIS — I2510 Atherosclerotic heart disease of native coronary artery without angina pectoris: Secondary | ICD-10-CM | POA: Diagnosis not present

## 2018-01-02 DIAGNOSIS — J90 Pleural effusion, not elsewhere classified: Secondary | ICD-10-CM | POA: Insufficient documentation

## 2018-01-02 LAB — CBC WITH DIFFERENTIAL/PLATELET
BASOS PCT: 0 %
Basophils Absolute: 0 10*3/uL (ref 0.0–0.1)
Eosinophils Absolute: 0.1 10*3/uL (ref 0.0–0.5)
Eosinophils Relative: 1 %
HEMATOCRIT: 41.6 % (ref 34.8–46.6)
HEMOGLOBIN: 13.5 g/dL (ref 11.6–15.9)
LYMPHS PCT: 13 %
Lymphs Abs: 1.2 10*3/uL (ref 0.9–3.3)
MCH: 30.6 pg (ref 25.1–34.0)
MCHC: 32.5 g/dL (ref 31.5–36.0)
MCV: 94.3 fL (ref 79.5–101.0)
MONO ABS: 0.5 10*3/uL (ref 0.1–0.9)
MONOS PCT: 5 %
NEUTROS ABS: 7.6 10*3/uL — AB (ref 1.5–6.5)
NEUTROS PCT: 81 %
Platelets: 299 10*3/uL (ref 145–400)
RBC: 4.41 MIL/uL (ref 3.70–5.45)
RDW: 13.5 % (ref 11.2–14.5)
WBC: 9.4 10*3/uL (ref 3.9–10.3)

## 2018-01-02 LAB — COMPREHENSIVE METABOLIC PANEL
ALBUMIN: 3.2 g/dL — AB (ref 3.5–5.0)
ALT: 22 U/L (ref 0–55)
AST: 20 U/L (ref 5–34)
Alkaline Phosphatase: 72 U/L (ref 40–150)
Anion gap: 10 (ref 3–11)
BUN: 9 mg/dL (ref 7–26)
CALCIUM: 8.8 mg/dL (ref 8.4–10.4)
CHLORIDE: 101 mmol/L (ref 98–109)
CO2: 29 mmol/L (ref 22–29)
Creatinine, Ser: 0.93 mg/dL (ref 0.60–1.10)
GFR calc non Af Amer: 56 mL/min — ABNORMAL LOW (ref >60)
GLUCOSE: 173 mg/dL — AB (ref 70–140)
POTASSIUM: 3.8 mmol/L (ref 3.5–5.1)
Sodium: 140 mmol/L (ref 136–145)
Total Bilirubin: 0.5 mg/dL (ref 0.2–1.2)
Total Protein: 6.7 g/dL (ref 6.4–8.3)

## 2018-01-02 MED ORDER — IOPAMIDOL (ISOVUE-300) INJECTION 61%
INTRAVENOUS | Status: AC
  Filled 2018-01-02: qty 75

## 2018-01-02 MED ORDER — IOPAMIDOL (ISOVUE-300) INJECTION 61%
75.0000 mL | Freq: Once | INTRAVENOUS | Status: AC | PRN
  Administered 2018-01-02: 75 mL via INTRAVENOUS

## 2018-01-04 ENCOUNTER — Telehealth: Payer: Self-pay | Admitting: Internal Medicine

## 2018-01-04 ENCOUNTER — Encounter: Payer: Self-pay | Admitting: Internal Medicine

## 2018-01-04 ENCOUNTER — Inpatient Hospital Stay (HOSPITAL_BASED_OUTPATIENT_CLINIC_OR_DEPARTMENT_OTHER): Payer: Medicare HMO | Admitting: Internal Medicine

## 2018-01-04 VITALS — BP 133/54 | HR 98 | Temp 98.1°F | Resp 18 | Ht 66.0 in | Wt 174.6 lb

## 2018-01-04 DIAGNOSIS — C3490 Malignant neoplasm of unspecified part of unspecified bronchus or lung: Secondary | ICD-10-CM

## 2018-01-04 DIAGNOSIS — C182 Malignant neoplasm of ascending colon: Secondary | ICD-10-CM | POA: Diagnosis not present

## 2018-01-04 DIAGNOSIS — C3411 Malignant neoplasm of upper lobe, right bronchus or lung: Secondary | ICD-10-CM | POA: Diagnosis not present

## 2018-01-04 NOTE — Progress Notes (Signed)
[facility] [phone]   [phone]  OFFICE PROGRESS NOTE  Hoyt Koch, MD [place] [address]  DIAGNOSIS:  1) Stage IIIA (T1a, N2, M0) non-small cell lung cancer Non-small cell carcinoma of lung, adenocarcinoma presented with right upper lobe lung mass and mediastinal lymphadenopathy diagnosed in May 2016. 2) stage II (T3, N0, MX) well-differentiated invasive adenocarcinoma diagnosed in February 2018.   PRIOR THERAPY:  1) Concurrent chemoradiation with chemotherapy in the form of weekly carboplatin for an AUC of 2 and paclitaxel at 45 mg/m given concurrent with radiation. Last dose was given 06/18/2015 with partial response. 2) status post laparoscopic partial colectomy for ascending colon cancer under the care of Dr. Marcello Moores on 02/08/2017.  CURRENT THERAPY: Observation.  INTERVAL HISTORY: Pamela Mcguire 83 y.o. female returns to the clinic today for follow-up visit accompanied by his daughter.  The patient has no complaints today.  She denied having any chest pain but continues to have shortness of breath with exertion with no cough or hemoptysis.  She lost a few pounds since her last visit.  She denied having any fever or chills.  She has no nausea, vomiting, diarrhea or constipation.  The patient had repeat CT scan of the chest performed recently and she is here for evaluation and discussion of her scan results.  MEDICAL HISTORY: Past Medical History:  Diagnosis Date  . AF (atrial fibrillation) ([place])   . Anxiety   . Arthritis    "thighs, hips, arms" (04/28/2015)  . Breast cancer ([place])    right mastectomy  . CAD (coronary artery disease)   . Dehydration 12/06/2016  . Dementia   . Depression   . Diverticulitis   . GERD (gastroesophageal reflux disease)   . HCAP (healthcare-associated pneumonia) 09/15/2015  . Hyperlipidemia   . Hypertension   . Non-small cell carcinoma of lung, stage 3 ([place]) 04/30/2015  .  Prediabetes   . Seasonal allergies   . Shortness of breath dyspnea   . Sleep apnea     dx'd 10/2014,refused cpap machine  . Urinary frequency   . UTI (urinary tract infection) 06/06/2015    ALLERGIES:  has No Known Allergies.  MEDICATIONS:  Current Outpatient Medications  Medication Sig Dispense Refill  . albuterol (PROVENTIL HFA;VENTOLIN HFA) 108 (90 Base) MCG/ACT inhaler Inhale 1 puff into the lungs every 6 (six) hours as needed for wheezing or shortness of breath. Reported on 06/01/2016 18 g 1  . apixaban (ELIQUIS) 5 MG TABS tablet Take 1 tablet (5 mg total) by mouth 2 (two) times daily. 60 tablet 5  . atorvastatin (LIPITOR) 40 MG tablet TAKE 1 TABLET(40 MG) BY MOUTH DAILY 90 tablet 0  . CALCIUM-VITAMIN D PO Take 1 tablet by mouth daily.    . digoxin (DIGOX) 0.125 MG tablet Take 1 tablet (0.125 mg total) by mouth daily. 30 tablet 5  . diltiazem (CARDIZEM CD) 240 MG 24 hr capsule Take 1 capsule (240 mg total) by mouth daily. 90 capsule 3  . escitalopram (LEXAPRO) 10 MG tablet TAKE 1 TABLET(10 MG) BY MOUTH DAILY. FOLLOW-UP APPOINTMENT IS DUE 30 tablet 5  . ferrous sulfate 325 (65 FE) MG tablet Take 325 mg by mouth daily with breakfast.    . furosemide (LASIX) 20 MG tablet Take 1 tablet (20 mg total) by mouth daily. 90 tablet 3  . ipratropium-albuterol (DUONEB) 0.5-2.5 (3) MG/3ML SOLN Take 3 mLs by nebulization 4 (four) times daily as needed. 360 mL  11  . loratadine (CLARITIN) 10 MG tablet Take 1 tablet (10 mg total) by mouth at bedtime. 90 tablet 3  . LORazepam (ATIVAN) 2 MG tablet TAKE 1 TABLET BY MOUTH AT BEDTIME AS NEEDED FOR ANXIETY (Patient taking differently: Take 1 mg by mouth every 6 (six) hours as needed for sleep. TAKE 1 TABLET BY MOUTH AT BEDTIME AS NEEDED FOR ANXIETY) 30 tablet 1  . mirtazapine (REMERON) 15 MG tablet Take 1 tablet (15 mg total) by mouth at bedtime. 90 tablet 0  . Multiple Vitamins-Minerals (MULTIVITAMIN WITH MINERALS) tablet Take 1 tablet by mouth daily. gummie      . NUTRITIONAL SUPPLEMENT LIQD Take 120 mLs by mouth 3 (three) times daily. MedPass    . oxyCODONE (OXY IR/ROXICODONE) 5 MG immediate release tablet Take 0.5-1 tablets (2.5-5 mg total) by mouth every 6 (six) hours as needed for severe pain. 30 tablet 0  . OXYGEN Inhale 2 L into the lungs as needed.    . pantoprazole (PROTONIX) 40 MG tablet Take 1 tablet (40 mg total) by mouth daily. 90 tablet 1   No current facility-administered medications for this visit.     SURGICAL HISTORY:  Past Surgical History:  Procedure Laterality Date  . BREAST SURGERY    . CARDIOVERSION    . Cataract surgery    . CESAREAN SECTION  X 2  . COLON SURGERY    . COLONOSCOPY    . COLONOSCOPY N/A 01/13/2017   Procedure: COLONOSCOPY;  Surgeon: Gatha Mayer, MD;  Location: [facility];  Service: Endoscopy;  Laterality: N/A;  . ESOPHAGOGASTRODUODENOSCOPY N/A 01/13/2017   Procedure: ESOPHAGOGASTRODUODENOSCOPY (EGD);  Surgeon: Gatha Mayer, MD;  Location: [facility];  Service: Endoscopy;  Laterality: N/A;  . LAPAROSCOPIC PARTIAL COLECTOMY N/A 02/08/2017   Procedure: LAPAROSCOPIC PARTIAL COLECTOMY;  Surgeon: Leighton Ruff, MD;  Location: [facility] ORS;  Service: General;  Laterality: N/A;  . MASTECTOMY Right   . MEDIASTINOSCOPY N/A 04/28/2015   Procedure: MEDIASTINOSCOPY;  Surgeon: [name], MD;  Location: [facility] OR;  Service: Thoracic;  Laterality: N/A;  . TONSILLECTOMY      REVIEW OF SYSTEMS:  A comprehensive review of systems was negative except for: Respiratory: positive for dyspnea on exertion   PHYSICAL EXAMINATION: General appearance: alert, cooperative and no distress Head: Normocephalic, without obvious abnormality, atraumatic Neck: no adenopathy, no JVD, supple, symmetrical, trachea midline and thyroid not enlarged, symmetric, no tenderness/mass/nodules Lymph nodes: Cervical, supraclavicular, and axillary nodes normal. Resp: clear to auscultation bilaterally Back: symmetric, no curvature. ROM normal. No CVA  tenderness. Cardio: regular rate and rhythm, S1, S2 normal, no murmur, click, rub or gallop GI: soft, non-tender; bowel sounds normal; no masses,  no organomegaly Extremities: extremities normal, atraumatic, no cyanosis or edema  ECOG PERFORMANCE STATUS: 1 - Symptomatic but completely ambulatory  Blood pressure (!) 133/54, pulse 98, temperature 98.1 F (36.7 C), temperature source Oral, resp. rate 18, height 5\' 6"  (1.676 m), weight 174 lb 9.6 oz (79.2 kg), SpO2 95 %.  LABORATORY DATA: Lab Results  Component Value Date   WBC 9.4 01/02/2018   HGB 13.5 01/02/2018   HCT 41.6 01/02/2018   MCV 94.3 01/02/2018   PLT 299 01/02/2018      Chemistry      Component Value Date/Time   NA 140 01/02/2018 1122   NA 140 07/04/2017 1244   K 3.8 01/02/2018 1122   K 5.0 07/04/2017 1244   CL 101 01/02/2018 1122   CO2 29 01/02/2018 1122   CO2  28 07/04/2017 1244   BUN 9 01/02/2018 1122   BUN 16.8 07/04/2017 1244   CREATININE 0.93 01/02/2018 1122   CREATININE 1.1 07/04/2017 1244   GLU 123 02/17/2017      Component Value Date/Time   CALCIUM 8.8 01/02/2018 1122   CALCIUM 9.9 07/04/2017 1244   ALKPHOS 72 01/02/2018 1122   ALKPHOS 64 07/04/2017 1244   AST 20 01/02/2018 1122   AST 15 07/04/2017 1244   ALT 22 01/02/2018 1122   ALT 15 07/04/2017 1244   BILITOT 0.5 01/02/2018 1122   BILITOT 0.49 07/04/2017 1244       RADIOGRAPHIC STUDIES: Ct Chest W Contrast  Result Date: 01/02/2018 CLINICAL DATA:  Non-small cell lung cancer follow-up. EXAM: CT CHEST WITH CONTRAST TECHNIQUE: Multidetector CT imaging of the chest was performed during intravenous contrast administration. CONTRAST:  63mL ISOVUE-300 IOPAMIDOL (ISOVUE-300) INJECTION 61% COMPARISON:  07/04/17. FINDINGS: Cardiovascular: Normal heart size. Aortic atherosclerosis. Calcification in the LAD, left circumflex and RCA coronary artery noted. Mediastinum/Nodes: The trachea appears patent and is midline. Normal appearance of the esophagus. Index  right paratracheal lymph node 8 mm, image 45 of series 2. Previously 8 mm. Lower right paratracheal node measures 1 cm, image 53 of series 2. Previously 9 mm. The index subcarinal lymph node is measures 1.1 cm, image 70 of series 2. Previously 1.3 cm. No supraclavicular or axillary adenopathy. Lungs/Pleura: Moderate changes of emphysema. There is a moderate to large right pleural effusion identified. Increased in volume from previous exams. Area of paramediastinal and perihilar masslike architectural distortion and fibrosis within the right midlung is again noted compatible with changes of external beam radiation. The pulmonary nodule within the anterior right lower lobe measures 1.1 x 0.8 cm, image 94 of series 5. Not significantly changed from previous exam. Upper Abdomen: No acute abnormality. Musculoskeletal: Spondylosis noted within the thoracic spine. No aggressive lytic or sclerotic bone lesions. Compression deformity involving the L1 vertebra with loss of approximately 40% of the vertebral body height is unchanged from previous exam. No suspicious bone lesions. IMPRESSION: 1. No significant change in size of index nodule within the right lower lobe. 2. Similar appearance of radiation change within the right hemithorax. 3. There is a moderate volume right pleural effusion which is significantly increased in volume from 07/04/2017. 4. Stable borderline enlarged mediastinal lymph nodes 5. Aortic Atherosclerosis (ICD10-I70.0) and Emphysema (ICD10-J43.9). Three vessel coronary artery calcifications noted. 6. Stable L1 compression fracture. Electronically Signed   By: Kerby Moors M.D.   On: 01/02/2018 15:59    ASSESSMENT AND PLAN:  This is a very pleasant 83 years old white female with: 1) Stage IIIA non-small cell lung cancer, adenocarcinoma status post concurrent chemoradiation with partial response and she was not considered a good candidate for consolidation chemotherapy. The patient is currently on  observation. The patient has been doing fine with no concerning complaints.  The recent CT scan of the chest showed no concerning findings for disease progression except for enlarging right pleural effusion.  She is currently asymptomatic.  I gave her the option of proceeding with ultrasound-guided thoracentesis for the right pleural effusion but the patient would like to continue in observation for now. I will see her back for follow-up visit in 4 months for evaluation and repeat CT scan of the chest. 2) stage II (T3, N0, Mx) colon adenocarcinoma status post partial colectomy with lymph node dissection. The patient is recovering well from her surgery. She is not a good candidate for adjuvant therapy based  on her stage and performance status. I recommended for the patient to continue on observation for now. The patient was advised to call immediately if she has any concerning symptoms in the interval. The patient voices understanding of current disease status and treatment options and is in agreement with the current care plan. All questions were answered. The patient knows to call the clinic with any problems, questions or concerns. We can certainly see the patient much sooner if necessary.  Disclaimer: This note was dictated with voice recognition software. Similar sounding words can inadvertently be transcribed and may not be corrected upon review.

## 2018-01-04 NOTE — Telephone Encounter (Signed)
Gave avs and calendar for June

## 2018-01-29 ENCOUNTER — Other Ambulatory Visit: Payer: Self-pay

## 2018-01-29 MED ORDER — PANTOPRAZOLE SODIUM 40 MG PO TBEC: 40.0000 mg | DELAYED_RELEASE_TABLET | Freq: Every day | ORAL | 1 refills | Status: DC

## 2018-02-01 ENCOUNTER — Ambulatory Visit (INDEPENDENT_AMBULATORY_CARE_PROVIDER_SITE_OTHER)
Admission: RE | Admit: 2018-02-01 | Discharge: 2018-02-01 | Disposition: A | Discharge status: Home / Self Care | Payer: Medicare HMO | Source: Ambulatory Visit | Attending: Internal Medicine | Admitting: Internal Medicine

## 2018-02-01 ENCOUNTER — Encounter: Payer: Self-pay | Admitting: Internal Medicine

## 2018-02-01 ENCOUNTER — Ambulatory Visit (INDEPENDENT_AMBULATORY_CARE_PROVIDER_SITE_OTHER): Payer: Medicare HMO | Admitting: Internal Medicine

## 2018-02-01 VITALS — BP 100/70 | HR 103 | Temp 97.6°F | Ht 66.0 in | Wt 169.0 lb

## 2018-02-01 DIAGNOSIS — J90 Pleural effusion, not elsewhere classified: Secondary | ICD-10-CM

## 2018-02-01 DIAGNOSIS — R0602 Shortness of breath: Secondary | ICD-10-CM

## 2018-02-01 NOTE — Patient Instructions (Signed)
We are checking the chest x-ray today.

## 2018-02-01 NOTE — Progress Notes (Signed)
   Subjective:    Patient ID: Pamela Mcguire, female    DOB: [birth year], 83 y.o.   MRN: [id number]  HPI The patient is an 83 YO female coming in for SOB. She has some dementia so her daughter is present to help with history. She gets very SOB. Had recent scan with oncology that showed stability of the lung cancer but worsening right pleural effusion. She is SOB with even a few steps at home with walker. She uses walker all the time and wheelchair when leaving the house. She does cough which is some worse. Denies sick contacts. Denies congestion or nose drainage.   Review of Systems  Unable to perform ROS: Dementia  Constitutional: Positive for activity change, appetite change and fatigue. Negative for chills, fever and unexpected weight change.  HENT: Negative for congestion, ear discharge, ear pain, postnasal drip, rhinorrhea, sinus pressure, sinus pain, sneezing, sore throat, tinnitus, trouble swallowing and voice change.   Respiratory: Positive for cough, chest tightness and shortness of breath. Negative for wheezing.   Cardiovascular: Negative.   Gastrointestinal: Negative.       Objective:   Physical Exam  Constitutional: She appears well-developed and well-nourished.  HENT:  Head: Normocephalic and atraumatic.  Eyes: EOM are normal.  Neck: Normal range of motion.  Cardiovascular: Normal rate and regular rhythm.  Pulmonary/Chest: Effort normal and breath sounds normal. She has no wheezes. She has no rales.  Some dyspneic on exam with talking  Abdominal: Soft. Bowel sounds are normal. She exhibits no distension. There is no tenderness. There is no rebound.  Musculoskeletal: She exhibits no edema.  Neurological: She is alert. Coordination normal.  She was not able to repeat back the risks or benefits of possible procedure although she was able to understand the procedure we were talking about, she did not have recall about her own health conditions during the course of the visit.   Skin:  Skin is warm and dry.  Psychiatric: She has a normal mood and affect.   Vitals:   02/01/18 1515  BP: 100/70  Pulse: (!) 103  Temp: 97.6 F (36.4 C)  TempSrc: Oral  SpO2: 92%  Weight: 169 lb (76.7 kg)  Height: 5\' 6"  (1.676 m)      Assessment & Plan:  Visit time 25 minutes: greater than 50% of that time was spent in face to face with the patient and her daughter talking to them about the options to help deal with the pleural effusion and if we should even consider doing that or not, as well as possible risk or benefits.

## 2018-02-02 ENCOUNTER — Other Ambulatory Visit: Payer: Self-pay | Admitting: Internal Medicine

## 2018-02-03 ENCOUNTER — Encounter: Payer: Self-pay | Admitting: Internal Medicine

## 2018-02-03 NOTE — Assessment & Plan Note (Signed)
Checking CXR today for size change from CT about 1 month ago. She is more SOB at home although I am not sure about worsening. Her memory is very poor and she cannot recall her own health conditions during the visit. She does not have capacity to understand the risks and benefits of the procedure so should be part of the decision but family would have to consent. Talked with her daughter extensively about the fact that drainage may alleviate her symptoms in the short term but likely this would come back and we could not guarantee how long it would last. The other option is to move in a palliative direction. The patient is not very interested in draining fluid although she is interested in breathing better.

## 2018-02-10 ENCOUNTER — Other Ambulatory Visit: Payer: Self-pay | Admitting: Physician Assistant

## 2018-02-12 NOTE — Telephone Encounter (Signed)
Refill Request.  

## 2018-02-21 ENCOUNTER — Ambulatory Visit: Payer: Medicare HMO | Admitting: Internal Medicine

## 2018-03-10 ENCOUNTER — Other Ambulatory Visit: Payer: Self-pay | Admitting: Adult Health

## 2018-03-10 ENCOUNTER — Other Ambulatory Visit: Payer: Self-pay | Admitting: Internal Medicine

## 2018-03-14 ENCOUNTER — Telehealth: Payer: Self-pay | Admitting: Internal Medicine

## 2018-03-14 NOTE — Telephone Encounter (Signed)
Copied from [place]. Topic: Quick Communication - See Telephone Encounter >> Mar 14, 2018 10:07 AM Rutherford Nail, NT wrote: CRM for notification. See Telephone encounter for: 03/14/18. [name], patient's daughter, calling and states that her and her sisters would like their mother to be a DNR. [name] would like a call back to discuss the steps she needs to take to get the done.  CB#: [phone]

## 2018-03-14 NOTE — Telephone Encounter (Signed)
Do you know the steps that they would take? Please advise. Thank you

## 2018-03-15 NOTE — Telephone Encounter (Signed)
Patient's daughter, [name], has been advised there will need to be a doctors visit with dr Sharlet Salina, if patient has healthcare POA, that person will need to be present and have the discussion about DNR, dr crawford has form in the office and will need to sign form---form needs to be added to patient's chart

## 2018-03-17 ENCOUNTER — Other Ambulatory Visit: Payer: Self-pay | Admitting: Internal Medicine

## 2018-03-29 ENCOUNTER — Other Ambulatory Visit: Payer: Self-pay | Admitting: Adult Health

## 2018-04-01 ENCOUNTER — Other Ambulatory Visit: Payer: Self-pay | Admitting: Internal Medicine

## 2018-04-03 ENCOUNTER — Encounter: Payer: Self-pay | Admitting: Internal Medicine

## 2018-04-03 ENCOUNTER — Ambulatory Visit: Payer: Medicare HMO | Admitting: Internal Medicine

## 2018-04-03 VITALS — BP 124/60 | HR 90 | Ht 66.0 in | Wt 168.0 lb

## 2018-04-03 DIAGNOSIS — I480 Paroxysmal atrial fibrillation: Secondary | ICD-10-CM

## 2018-04-03 DIAGNOSIS — I10 Essential (primary) hypertension: Secondary | ICD-10-CM

## 2018-04-03 DIAGNOSIS — R0603 Acute respiratory distress: Secondary | ICD-10-CM

## 2018-04-03 DIAGNOSIS — J90 Pleural effusion, not elsewhere classified: Secondary | ICD-10-CM

## 2018-04-03 NOTE — Progress Notes (Signed)
OFFICE NOTE  Chief Complaint:  Short of breath  Primary Care Physician: Hoyt Koch, MD  HPI:  Pamela Mcguire is a pleasant 83 year old female who recently moved from the [place] area back to [place]. She is here today to establish cardiology care. She apparently was recently hospitalized and quite ill in the intensive care unit. She was suffering from diverticulitis and went into atrial fibrillation. She does not recall many of the events however does report that she was cardioverted and started on Rythmol. She must is seen a cardiologist in the hospital however did not have cardiology followup. She has been maintained on this medication and is in sinus rhythm today. She was not started on anticoagulation which is very unusual. She does have a history of hypertension, age greater than 55 and dyslipidemia. Her CHADSVASC score is 2, therefore, long-term anticoagulation is indicated. There are also some discrepancies in her medications. It appears that she has 3 different cholesterol medications.  She denies any chest pain with exertion but does get short of breath. She denies any palpitations, presyncope or syncopal episodes. She reports that she never had a stress test. She denies suffering an MI in the hospital.  Pamela Mcguire returns today for followup. I'm pleased to report her nuclear stress test was negative for ischemia. She has since started Eliquis and is tolerating it well without bleeding problems. We simplified her cholesterol medications and she is currently on Vytorin. She does report poor sleep at night and napping during the day. She says she has to urinate several times at night. She is noted to have a history of snoring but is unclear whether she has any true apnea. She often has trouble falling asleep as well as staying asleep.  I saw [name] back in the office today. She is reporting some mild positional dizziness. She also reports of chest pain when laying  down at night. Although she recently had a stress test as above which was negative. She continues to be fatigued throughout the day. She did undergo a sleep study which was markedly abnormal demonstrating an AHI of close to 50. She is scheduled to have a CPAP titration on March 28. I think this will make a significant difference in her symptoms. She reports no recurrence of her atrial fibrillation and is off Rythmol.  Pamela Mcguire returns today in the office. She denies any recurrent A. fib and is in a sinus rhythm today. There are occasional PVCs. She is actually off of all of her blood pressure medicine due to hypotension. She's had significant weight loss. She's been diagnosed with a lung cancer and is undergone chemotherapy. She apparently said all the chemotherapy that she can tolerate however the tumor is not resolved, but has shrunk. She short of breath most likely related to this. She continues on Eliquis without any bleeding problems. She is intolerant to CPAP despite her severe obstructive sleep apnea due to anxiety. She was previously on Xanax however is out of that today. I advised her that I did not prescribe benzodiazepine's or narcotics and that she should seek out her primary care provider for this.  I had the pleasure of seeing Pamela Mcguire back today in the office. Overall she reports she is doing better and has completed her chemotherapy. She does get some mild shortness of breath which is unchanged. She is describing some chest discomfort however it's mostly when she lays down at night and not with exertion. This is not worsening  is been a problem for several months. She sees to be tolerating Eliquis without bleeding problems. She is on low-dose carvedilol for rate control after she was taken off of Rythmol. Blood pressure is a little low today however in general she has had blood pressures in the 120s and is been asymptomatic. Daughter is concerned about the cost of Vytorin since she is on  Medicare with a supplemental. Previously she had taken Lipitor without problems. Also reportedly she is taking ibuprofen with diphenhydramine (Advil PM) for sleep. I advised her that this is not a good idea based on the fact that ibuprofen may increase her bleeding risk on Eliquis.  08/24/2017  Pamela Mcguire returns today for follow-up. She reports significant shortness of breath. Earlier this week she had a flu shot she's felt unwell for the last couple days. She's had some progressive dyspnea, chills and mildly productive cough. Today she is noted to be markedly tachypnea can the office. Respiratory rate is around 50. She notes some tingling in her fingers and a feeling that she may pass out. Oxygen saturation was 98% on supplemental oxygen. Blood pressure was stable. She reported chest discomfort. EKG shows A. fib with RVR rate of 106.  04/03/2018  Pamela Mcguire was seen today in follow-up.  She again reports shortness of breath.  She was noted to be markedly tachypneic in the office and we applied supplemental oxygen.  She apparently supposed to be wearing oxygen at home but admits to not using it that regularly.  Interestingly she saw pulmonary, Dr. Elsworth Soho, who did not feel she had any significant COPD.  She does have recurrent pleural effusion, status post prior thoracentesis.  Most recently in February or March a CT scan showed moderate sized pleural effusion.  I had a long discussion with her and her daughter today who said that [name] did not want to be resuscitated.  Subsequently she indicated she did want everything done.  I am afraid that her dementia may be confusing the situation.  Her daughter indicated she had had long discussions with her primary care provider who is more appropriate to determine if [name] truly does want to be DNR.  At this point, it is clear she does not want further escalation of treatment, including a repeat thoracentesis or referral for possible Pleurx catheter.  She  understands that this may lead to progressive dyspnea and the need for palliative or hospice medicine.  Diuretics are unlikely to be of additional benefit.  I do not think this is diastolic heart failure.  A. fib is rate controlled today at 89.  PMHx:  Past Medical History:  Diagnosis Date  . AF (atrial fibrillation) ([place])   . Anxiety   . Arthritis    "thighs, hips, arms" (04/28/2015)  . Breast cancer ([place])    right mastectomy  . CAD (coronary artery disease)   . Dehydration 12/06/2016  . Dementia   . Depression   . Diverticulitis   . GERD (gastroesophageal reflux disease)   . HCAP (healthcare-associated pneumonia) 09/15/2015  . Hyperlipidemia   . Hypertension   . Non-small cell carcinoma of lung, stage 3 ([place]) 04/30/2015  . Prediabetes   . Seasonal allergies   . Shortness of breath dyspnea   . Sleep apnea     dx'd 10/2014,refused cpap machine  . Urinary frequency   . UTI (urinary tract infection) 06/06/2015    Past Surgical History:  Procedure Laterality Date  . BREAST SURGERY    . CARDIOVERSION    .  Cataract surgery    . CESAREAN SECTION  X 2  . COLON SURGERY    . COLONOSCOPY    . COLONOSCOPY N/A 01/13/2017   Procedure: COLONOSCOPY;  Surgeon: Gatha Mayer, MD;  Location: [facility] ENDOSCOPY;  Service: Endoscopy;  Laterality: N/A;  . ESOPHAGOGASTRODUODENOSCOPY N/A 01/13/2017   Procedure: ESOPHAGOGASTRODUODENOSCOPY (EGD);  Surgeon: Gatha Mayer, MD;  Location: [facility];  Service: Endoscopy;  Laterality: N/A;  . LAPAROSCOPIC PARTIAL COLECTOMY N/A 02/08/2017   Procedure: LAPAROSCOPIC PARTIAL COLECTOMY;  Surgeon: Leighton Ruff, MD;  Location: [facility] ORS;  Service: General;  Laterality: N/A;  . MASTECTOMY Right   . MEDIASTINOSCOPY N/A 04/28/2015   Procedure: MEDIASTINOSCOPY;  Surgeon: [name], MD;  Location: [facility];  Service: Thoracic;  Laterality: N/A;  . TONSILLECTOMY      FAMHx:  Family History  Problem Relation Age of Onset  . Mental illness Mother     SOCHx:    reports that she quit smoking about 25 years ago. Her smoking use included cigarettes. She has a 35.00 pack-year smoking history. She has never used smokeless tobacco. She reports that she drinks about 5.4 oz of alcohol per week. She reports that she does not use drugs.  ALLERGIES:  No Known Allergies  ROS: Pertinent items noted in HPI and remainder of comprehensive ROS otherwise negative.  HOME MEDS: Current Outpatient Medications  Medication Sig Dispense Refill  . albuterol (PROVENTIL HFA;VENTOLIN HFA) 108 (90 Base) MCG/ACT inhaler Inhale 1 puff into the lungs every 6 (six) hours as needed for wheezing or shortness of breath. Reported on 06/01/2016 18 g 1  . atorvastatin (LIPITOR) 40 MG tablet TAKE 1 TABLET(40 MG) BY MOUTH DAILY 90 tablet 1  . CALCIUM-VITAMIN D PO Take 1 tablet by mouth daily.    Marland Kitchen DIGOX 125 MCG tablet TAKE 1 TABLET(0.125 MG) BY MOUTH DAILY 30 tablet 5  . diltiazem (CARDIZEM CD) 240 MG 24 hr capsule Take 1 capsule (240 mg total) by mouth daily. 90 capsule 3  . ELIQUIS 5 MG TABS tablet TAKE 1 TABLET(5 MG) BY MOUTH TWICE DAILY 180 tablet 1  . escitalopram (LEXAPRO) 10 MG tablet TAKE 1 TABLET(10 MG) BY MOUTH DAILY. FOLLOW-UP APPOINTMENT IS DUE 30 tablet 5  . ferrous sulfate 325 (65 FE) MG tablet Take 325 mg by mouth daily with breakfast.    . furosemide (LASIX) 20 MG tablet Take 1 tablet (20 mg total) by mouth daily. 90 tablet 3  . ipratropium-albuterol (DUONEB) 0.5-2.5 (3) MG/3ML SOLN Take 3 mLs by nebulization 4 (four) times daily as needed. 360 mL 11  . loratadine (CLARITIN) 10 MG tablet Take 1 tablet (10 mg total) by mouth at bedtime. 90 tablet 3  . LORazepam (ATIVAN) 2 MG tablet TAKE 1 TABLET BY MOUTH AT BEDTIME AS NEEDED FOR ANXIETY (Patient taking differently: Take 1 mg by mouth every 6 (six) hours as needed for sleep. TAKE 1 TABLET BY MOUTH AT BEDTIME AS NEEDED FOR ANXIETY) 30 tablet 1  . mirtazapine (REMERON) 15 MG tablet TAKE 1 TABLET(15 MG) BY MOUTH AT BEDTIME 90  tablet 1  . Multiple Vitamins-Minerals (MULTIVITAMIN WITH MINERALS) tablet Take 1 tablet by mouth daily. gummie    . NUTRITIONAL SUPPLEMENT LIQD Take 120 mLs by mouth 3 (three) times daily. MedPass    . oxyCODONE (OXY IR/ROXICODONE) 5 MG immediate release tablet Take 0.5-1 tablets (2.5-5 mg total) by mouth every 6 (six) hours as needed for severe pain. 30 tablet 0  . OXYGEN Inhale 2 L into  the lungs as needed.    . pantoprazole (PROTONIX) 40 MG tablet Take 1 tablet (40 mg total) by mouth daily. 90 tablet 1  . tolterodine (DETROL LA) 4 MG 24 hr capsule TK 1 C PO QD  11   No current facility-administered medications for this visit.     LABS/IMAGING: No results found for this or any previous visit (from the past 48 hour(s)). No results found.  VITALS: Pulse 90   Ht 5\' 6"  (1.676 m)   Wt 168 lb (76.2 kg)   LMP  (LMP Unknown)   BMI 27.12 kg/m   EXAM: General appearance: alert, moderate distress, moderately obese and pale Neck: no carotid bruit and no JVD Lungs: diminished breath sounds RLL and RML Heart: irregularly irregular rhythm and No murmur Abdomen: soft, non-tender; bowel sounds normal; no masses,  no organomegaly Extremities: extremities normal, atraumatic, no cyanosis or edema Pulses: Diminished bilaterally Skin: Pale, warm, dry Neurologic: Mental status: Awake, but confused, answers differently to questions Psych: Pleasant  EKG: A. fib with CVR at 89, nonspecific T wave changes-personally reviewed  ASSESSMENT: 1. Acute respiratory distress-right pleural effusion to the mid lung 2. A. fib with CVR 3. Hypertension- off of medications due to hypotension 4. Dyslipidemia on Vytorin 5. Dyspnea on exertion - newly diagnosed lung cancer  6. Relative bradycardia-fatigue 7. Insomnia/somnolence - severe sleep apnea, intolerant to CPAP due to anxiety  PLAN: 1.   Mrs. Genther is breathless again today in the office.  She benefited from oxygen.  She most likely has recurrent  right pleural effusion to mid lung based on dullness to percussion on exam.  She does not want another thoracentesis or Pleurx catheter evaluation.  I suspect she will become more short of breath and require more oxygen.  We tried to discuss DNR however she gave conflicting answers.  This is already been discussed with her PCP and I would recommend evaluating her for palliative care and/or hospice if her breathlessness continues to worsen and she continues to refuse any further procedures.  Follow-up with me in 6 months.  Pixie Casino, MD, [facility], [place] Director of the [facility]  [facility] Diplomate of the American Board of Clinical Lipidology Attending Cardiologist  Direct Dial: [phone]  Fax: [phone]  Website:  [email or web address] Galster 04/03/2018, 2:16 PM

## 2018-04-03 NOTE — Patient Instructions (Signed)
Your physician wants you to follow-up in: 6 months with Dr. Hilty. You will receive a reminder letter in the mail two months in advance. If you don't receive a letter, please call our office to schedule the follow-up appointment.    

## 2018-04-04 ENCOUNTER — Encounter: Payer: Self-pay | Admitting: Internal Medicine

## 2018-04-09 IMAGING — DX DG CHEST 2V
2 series · 2 of 2 positions shown · non-contrast
Comparison: 08/09/2017

CLINICAL DATA: Chest pain and cough

EXAM:
CHEST  2 VIEW

[chest ap]
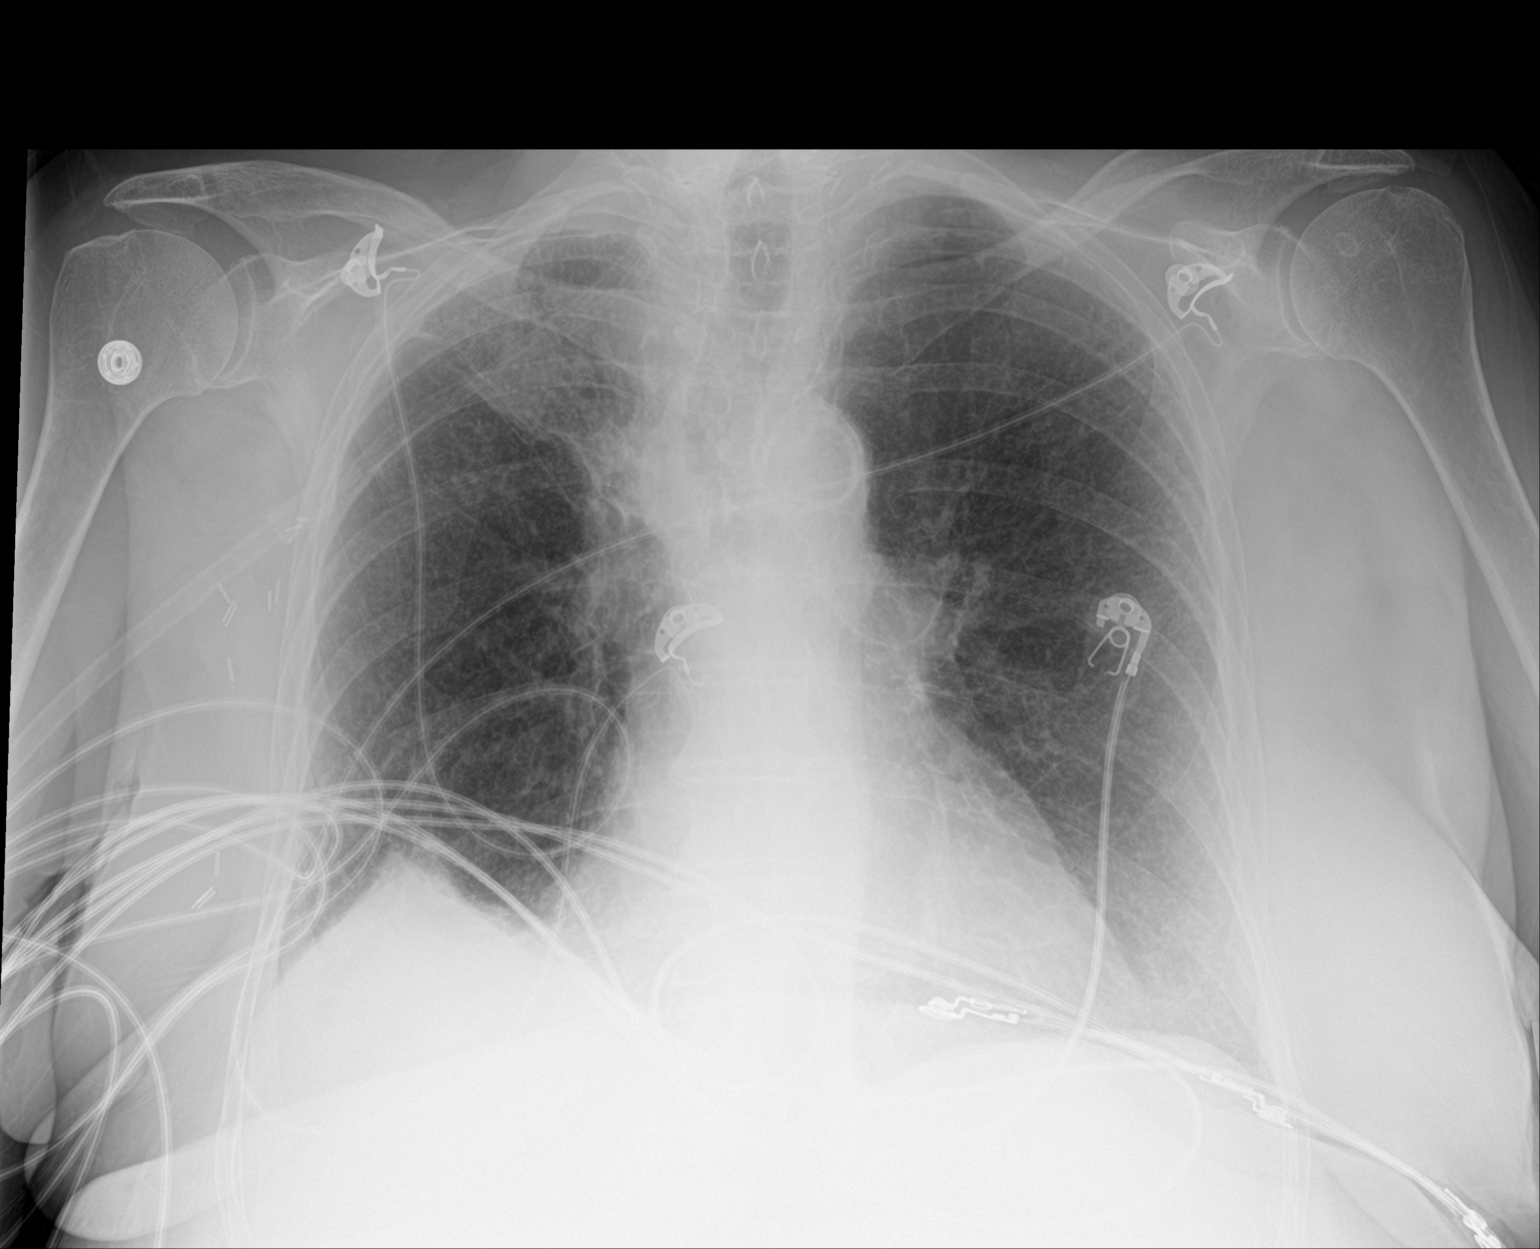

[chest lat]
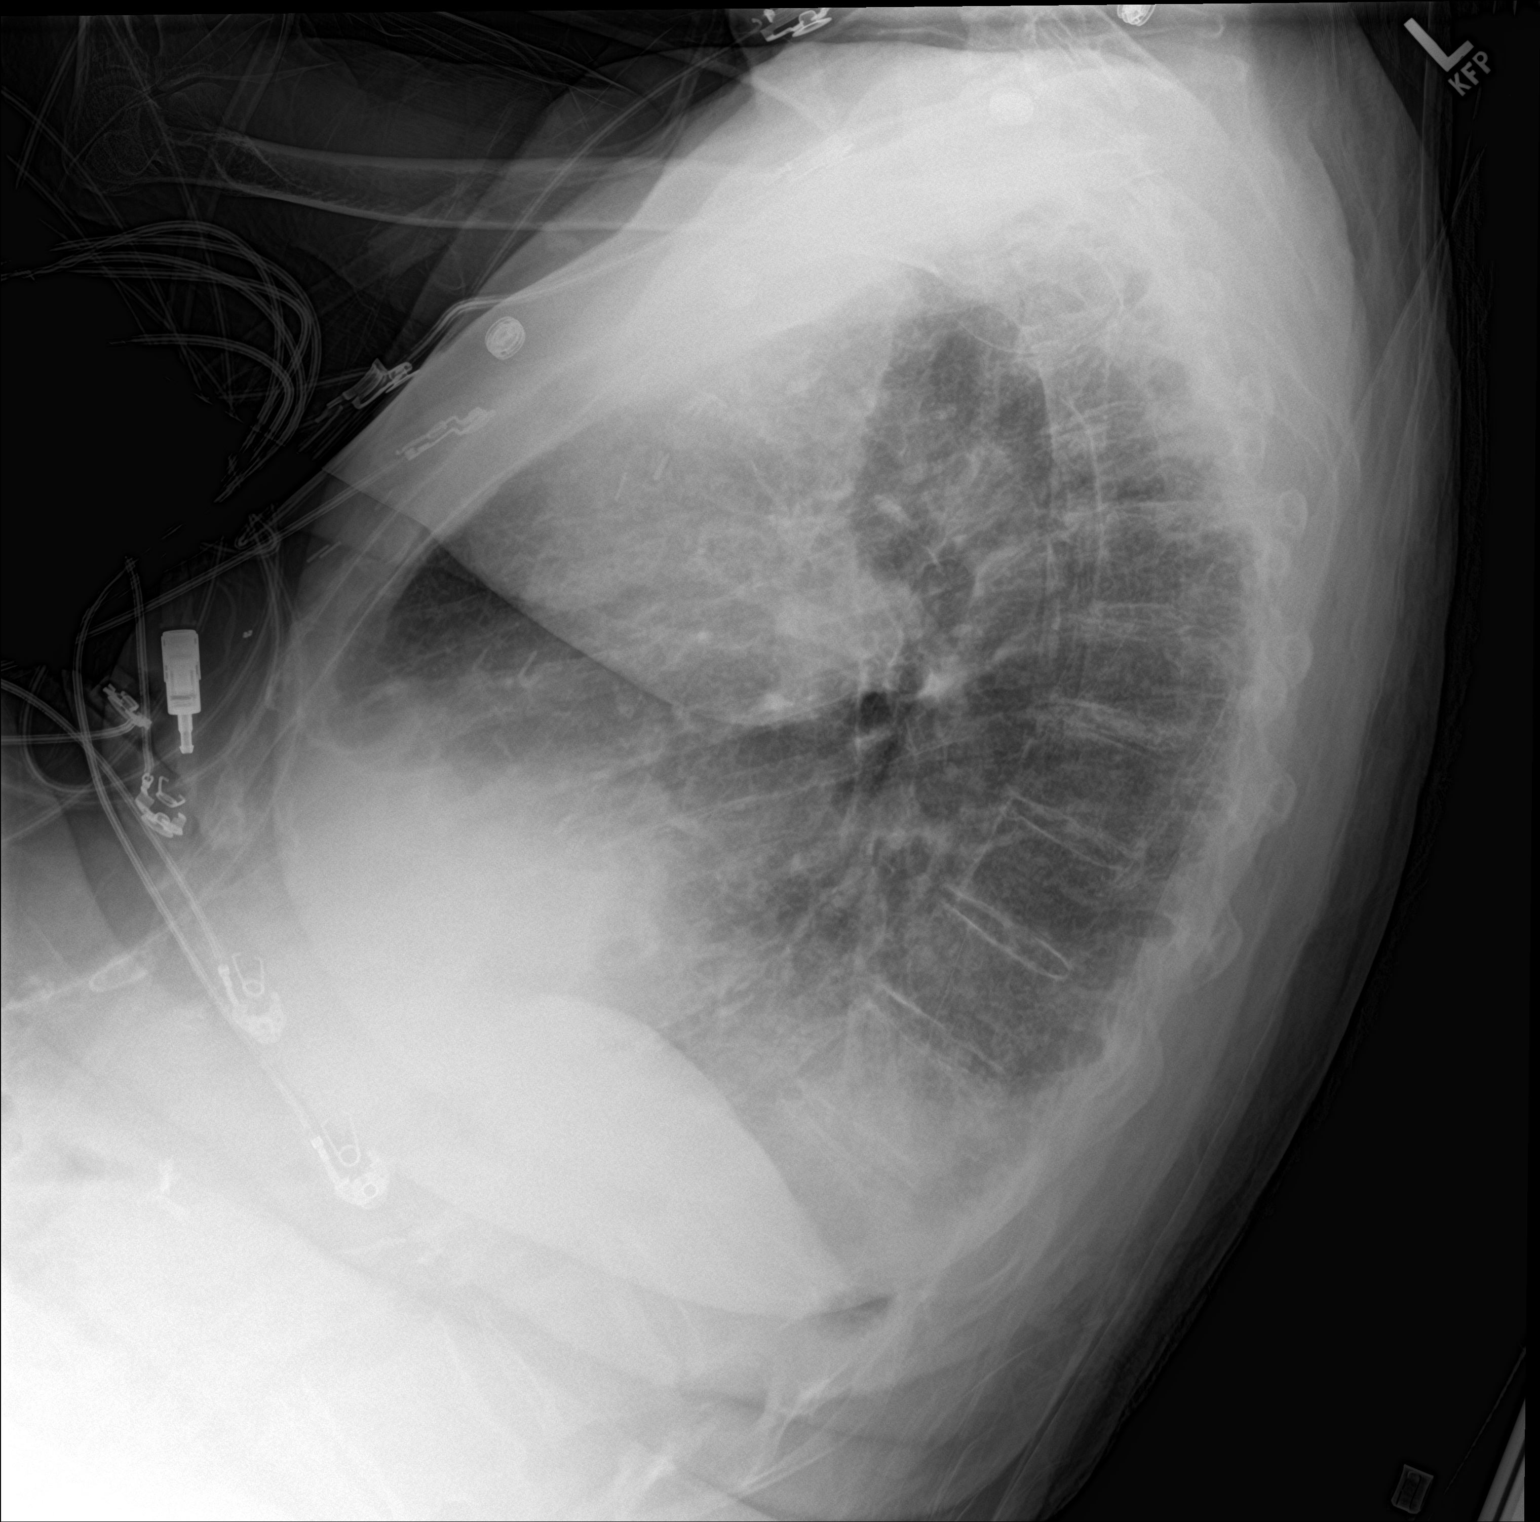

[2 of 2 positions shown; findings below may reference images not displayed]

FINDINGS: The heart size and mediastinal contours are within normal limits.
Atherosclerotic calcifications are noted at the aortic arch. There
is volume loss of the right lung with tenting of the inferior
pulmonary ligament and scarring in the right upper lobe. This
appears chronic. Fine interstitial prominence is seen bilaterally of
the lungs without pneumonic consolidation. Stable small right
posterior pleural effusion. Axillary clips are seen on the right. No
acute nor suspicious osseous abnormalities. The visualized skeletal
structures are unremarkable.
IMPRESSION: 1. Chronic volume loss on the right with tenting of the inferior
pulmonary ligament and scarring at the right lung apex.
2. Fine interstitial prominence of the lungs may reflect fibrosis.
3. Chronic stable small posterior pleural effusion.

## 2018-04-09 IMAGING — CT CT ANGIO CHEST
2 of 8 series · 18 of 46 positions shown · IV contrast (OMNI)
Comparison: 08/05/2017 CTA chest.  12/22/2016 PET-CT.

CLINICAL DATA: 82 y/o F; tachypnea and hypoxia, suspect pulmonary
embolus. History of lung cancer post radiation treatment.

EXAM:
CT ANGIOGRAPHY CHEST WITH CONTRAST
TECHNIQUE: Multidetector CT imaging of the chest was performed using the
standard protocol during bolus administration of intravenous
contrast. Multiplanar CT image reconstructions and MIPs were
obtained to evaluate the vascular anatomy.
CONTRAST:  69 cc Isovue 370

[Series 6: thins · axial · 0.79mm/px · z∈[+1140,+1427]mm · 15 of 317 slices shown]
[im 15/317  lung]
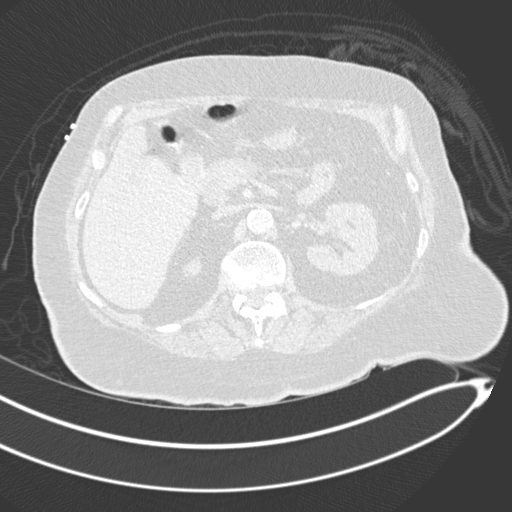
[im 44/317  soft-tissue]
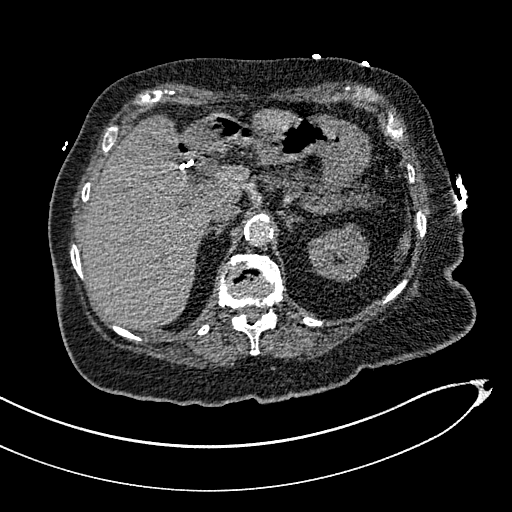
[im 58/317  lung]
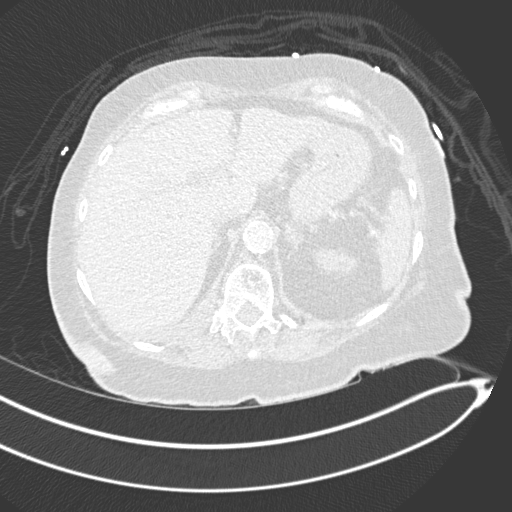
[im 72/317  soft-tissue]
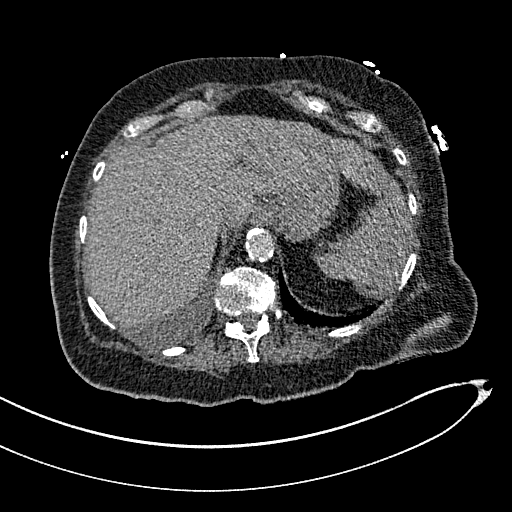
[im 101/317  lung]
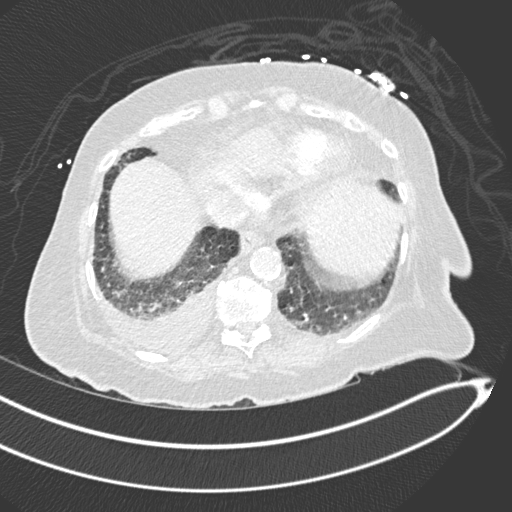
[im 115/317  soft-tissue]
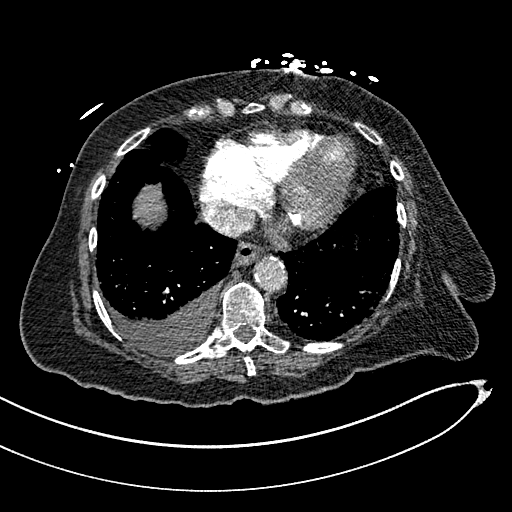
[im 144/317  lung]
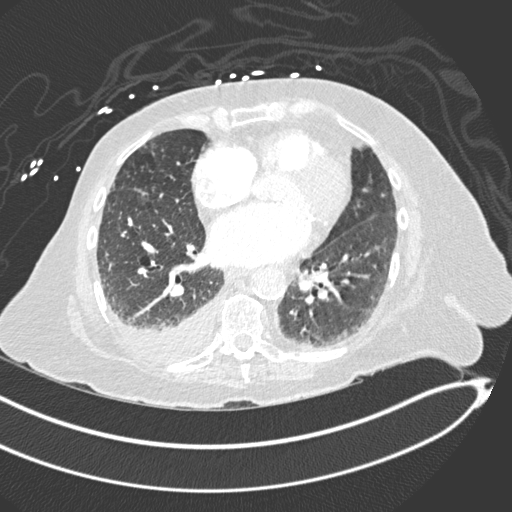
[im 159/317  soft-tissue]
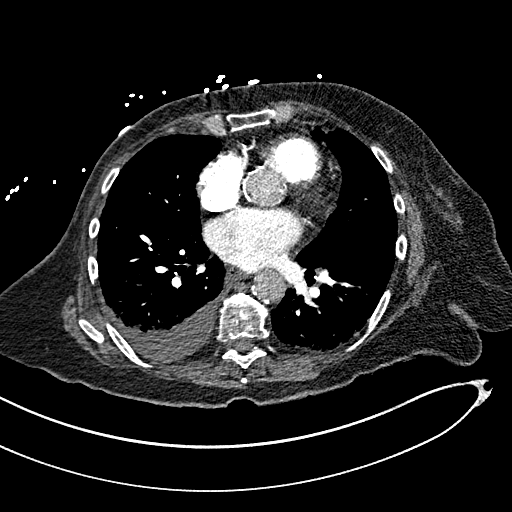
[im 173/317  lung]
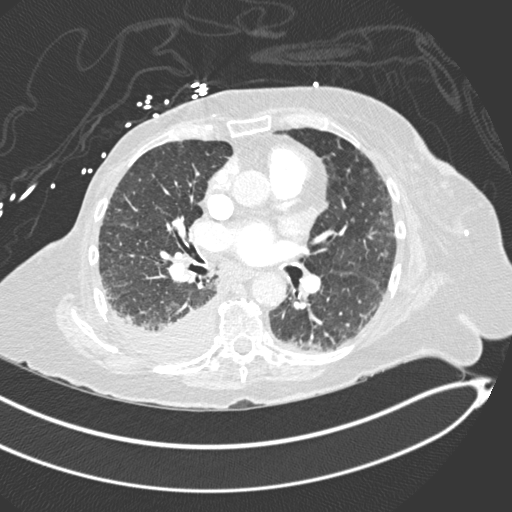
[im 202/317  soft-tissue]
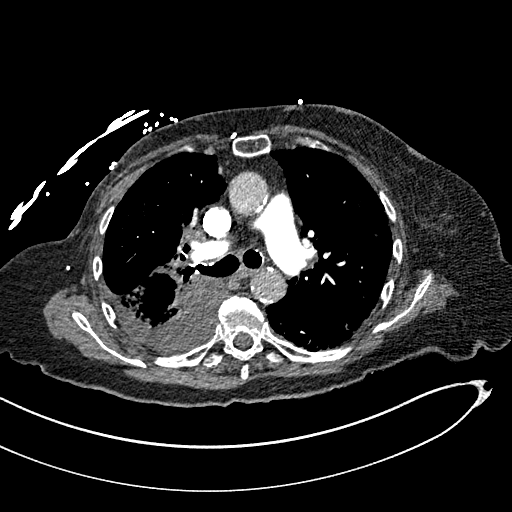
[im 216/317  lung]
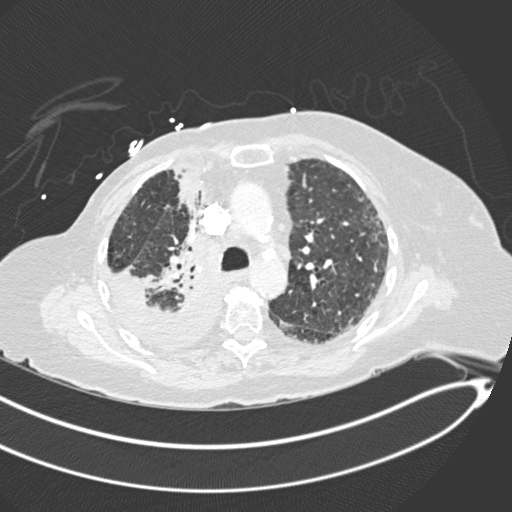
[im 245/317  soft-tissue]
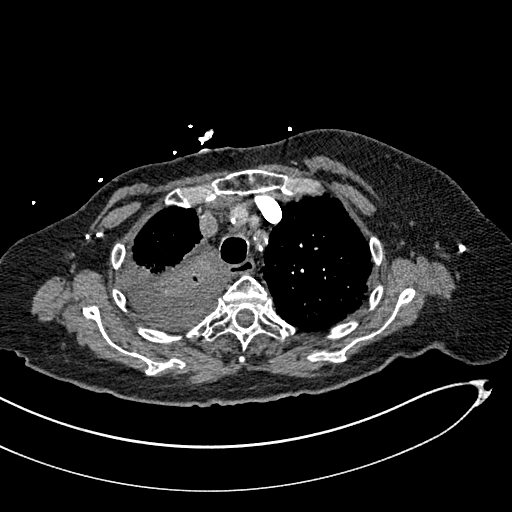
[im 259/317  lung]
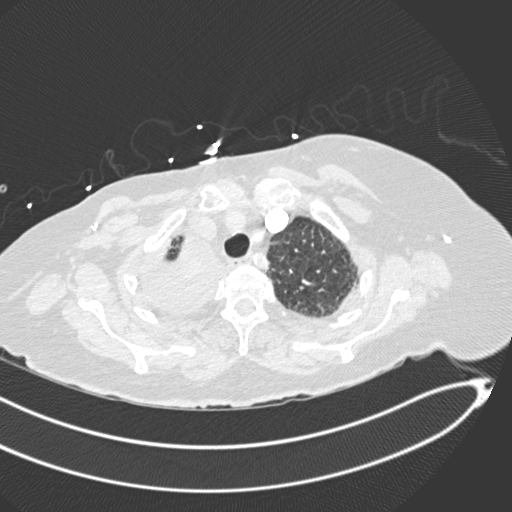
[im 273/317  soft-tissue]
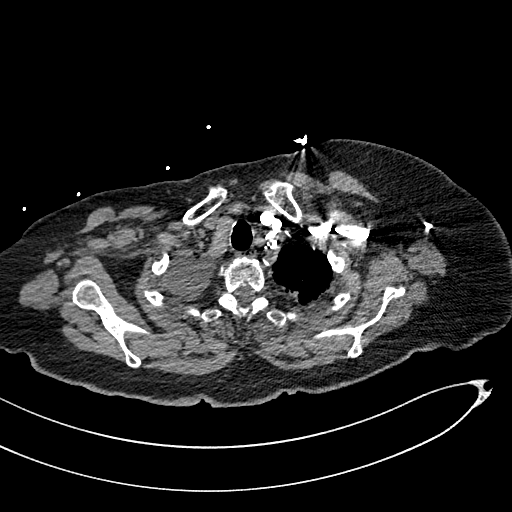
[im 302/317  lung]
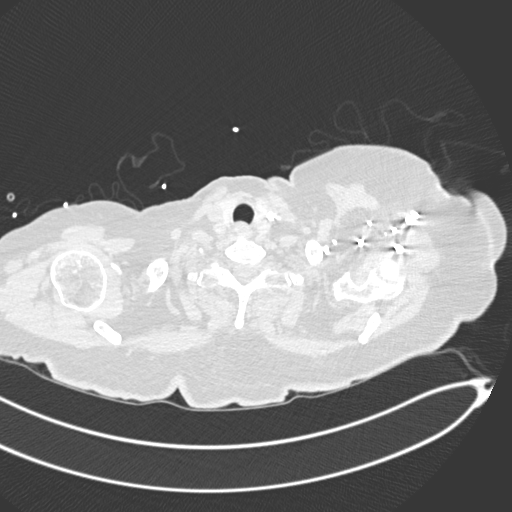

[Series 8: coronal mpr · coronal · 0.62mm/px · 3 of 150 slices shown]
[im 38/150  soft-tissue]
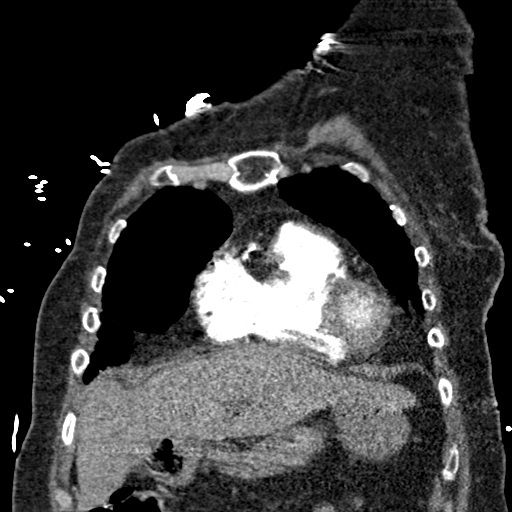
[im 75/150  soft-tissue]
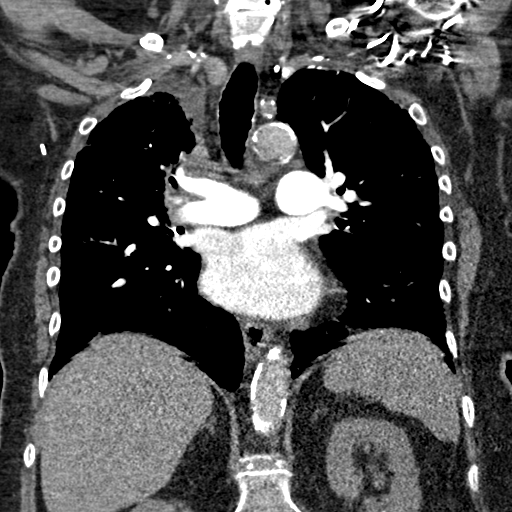
[im 112/150  soft-tissue]
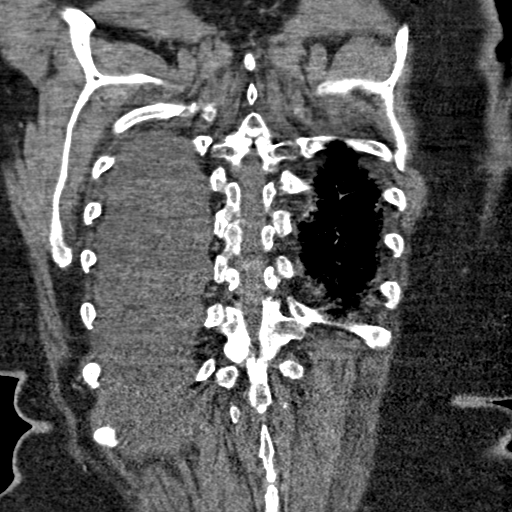

[18 of 46 positions shown; findings below may reference images not displayed]

FINDINGS: Cardiovascular: Normal heart size. No pericardial effusion. Moderate
coronary artery calcification. Aortic valvular and mitral annular
calcifications can be associated with valvular dysfunction. Normal
caliber thoracic aorta with moderate calcific atherosclerosis.

Satisfactory opacification of the pulmonary arteries. No pulmonary
embolus identified.

Mediastinum/Nodes: Stable right lower paratracheal, right upper
peritracheal, and carinal lymphadenopathy. Decreased size of
prevascular lymph nodes.

Lungs/Pleura: Diminution in pleural effusions with small residual
right-sided pleural effusion. Interlobular septal thickening is
present although decreased when compared to prior CT of the chest
compatible with interstitial edema. Additionally, there are fine
reticular opacities at periphery of the lungs likely representing
component of pulmonary fibrosis. There are numerous small (3-5 mm)
peripheral pulmonary nodules greatest in the lung bases and there is
a stable nodule in the right lower lobe measuring 15 x 8 mm (series
7, image 63) which demonstrated FDG uptake on prior PET-CT,
attention at follow-up is recommended. The dense radiation fibrosis
in the right upper lobe extending into the right hilum with
atelectatic lung, vascular pruning, in confluent soft tissue.
Evaluation for underlying neoplasm is best assessed with PET-CT.

Upper Abdomen: Stable 9 mm left adrenal nodule.  Cholecystectomy.

Musculoskeletal: Stable moderate L1 compression deformity. No new
acute osseous abnormality is identified.

Review of the MIP images confirms the above findings.
IMPRESSION: 1. No pulmonary embolus identified.
2. Interval decrease in pleural effusions with small residual
right-sided pleural effusion.
3. Interstitial pulmonary edema, decreased compared with prior CTA.
4. Radiation fibrosis changes/treated neoplasm in the right upper
lobe extending into the right hilum are stable from prior CTA.
5. Stable right lower and upper paratracheal as well as sub- carinal
lymphadenopathy, attention at follow-up recommended. Decreased size
of prevascular lymph nodes probably due to decrease in pulmonary
vascular congestion.
6. Several small peripheral pulmonary nodules and 15 mm right lower
lobe pulmonary nodule are stable.
7. Stable subcentimeter left adrenal nodule.
8. Stable moderate L1 compression deformity.
9. Moderate coronary artery and aortic calcific atherosclerosis.

By: Luboslav Harding M.D.

## 2018-04-11 ENCOUNTER — Other Ambulatory Visit: Payer: Self-pay | Admitting: Internal Medicine

## 2018-04-11 DIAGNOSIS — F329 Major depressive disorder, single episode, unspecified: Secondary | ICD-10-CM

## 2018-04-11 DIAGNOSIS — F32A Depression, unspecified: Secondary | ICD-10-CM

## 2018-05-01 ENCOUNTER — Ambulatory Visit (HOSPITAL_COMMUNITY)
Admission: RE | Admit: 2018-05-01 | Discharge: 2018-05-01 | Disposition: A | Discharge status: Home / Self Care | Payer: Medicare HMO | Source: Ambulatory Visit | Attending: Internal Medicine | Admitting: Internal Medicine

## 2018-05-01 ENCOUNTER — Inpatient Hospital Stay: Payer: Medicare HMO | Attending: Internal Medicine

## 2018-05-01 DIAGNOSIS — J439 Emphysema, unspecified: Secondary | ICD-10-CM | POA: Insufficient documentation

## 2018-05-01 DIAGNOSIS — C3490 Malignant neoplasm of unspecified part of unspecified bronchus or lung: Secondary | ICD-10-CM

## 2018-05-01 DIAGNOSIS — I700 Atherosclerosis of aorta: Secondary | ICD-10-CM | POA: Diagnosis not present

## 2018-05-01 DIAGNOSIS — R0609 Other forms of dyspnea: Secondary | ICD-10-CM | POA: Insufficient documentation

## 2018-05-01 DIAGNOSIS — C3411 Malignant neoplasm of upper lobe, right bronchus or lung: Secondary | ICD-10-CM | POA: Insufficient documentation

## 2018-05-01 DIAGNOSIS — R590 Localized enlarged lymph nodes: Secondary | ICD-10-CM | POA: Diagnosis not present

## 2018-05-01 DIAGNOSIS — J90 Pleural effusion, not elsewhere classified: Secondary | ICD-10-CM | POA: Diagnosis not present

## 2018-05-01 DIAGNOSIS — C182 Malignant neoplasm of ascending colon: Secondary | ICD-10-CM | POA: Diagnosis not present

## 2018-05-01 DIAGNOSIS — I2510 Atherosclerotic heart disease of native coronary artery without angina pectoris: Secondary | ICD-10-CM | POA: Diagnosis not present

## 2018-05-01 LAB — CBC WITH DIFFERENTIAL (CANCER CENTER ONLY)
BASOS PCT: 0 %
Basophils Absolute: 0 10*3/uL (ref 0.0–0.1)
Eosinophils Absolute: 0.1 10*3/uL (ref 0.0–0.5)
Eosinophils Relative: 1 %
HEMATOCRIT: 42 % (ref 34.8–46.6)
Hemoglobin: 13.5 g/dL (ref 11.6–15.9)
Lymphocytes Relative: 14 %
Lymphs Abs: 1.3 10*3/uL (ref 0.9–3.3)
MCH: 30.5 pg (ref 25.1–34.0)
MCHC: 32.1 g/dL (ref 31.5–36.0)
MCV: 95 fL (ref 79.5–101.0)
MONOS PCT: 7 %
Monocytes Absolute: 0.6 10*3/uL (ref 0.1–0.9)
NEUTROS ABS: 7.2 10*3/uL — AB (ref 1.5–6.5)
Neutrophils Relative %: 78 %
Platelet Count: 325 10*3/uL (ref 145–400)
RBC: 4.42 MIL/uL (ref 3.70–5.45)
RDW: 14.9 % — ABNORMAL HIGH (ref 11.2–14.5)
WBC Count: 9.2 10*3/uL (ref 3.9–10.3)

## 2018-05-01 LAB — CMP (CANCER CENTER ONLY)
ALK PHOS: 67 U/L (ref 40–150)
ALT: 19 U/L (ref 0–55)
AST: 20 U/L (ref 5–34)
Albumin: 3.7 g/dL (ref 3.5–5.0)
Anion gap: 11 (ref 3–11)
BUN: 11 mg/dL (ref 7–26)
CALCIUM: 9.5 mg/dL (ref 8.4–10.4)
CO2: 30 mmol/L — AB (ref 22–29)
Chloride: 101 mmol/L (ref 98–109)
Creatinine: 1.02 mg/dL (ref 0.60–1.10)
GFR, Est AFR Am: 58 mL/min — ABNORMAL LOW (ref >60)
GFR, Estimated: 50 mL/min — ABNORMAL LOW (ref >60)
GLUCOSE: 130 mg/dL (ref 70–140)
Potassium: 4.5 mmol/L (ref 3.5–5.1)
SODIUM: 142 mmol/L (ref 136–145)
Total Bilirubin: 0.6 mg/dL (ref 0.2–1.2)
Total Protein: 7.2 g/dL (ref 6.4–8.3)

## 2018-05-01 MED ORDER — IOHEXOL 300 MG/ML  SOLN
75.0000 mL | Freq: Once | INTRAMUSCULAR | Status: AC | PRN
  Administered 2018-05-01: 75 mL via INTRAVENOUS

## 2018-05-03 ENCOUNTER — Inpatient Hospital Stay: Payer: Medicare HMO | Admitting: Internal Medicine

## 2018-05-03 ENCOUNTER — Encounter: Payer: Self-pay | Admitting: Internal Medicine

## 2018-05-03 ENCOUNTER — Telehealth: Payer: Self-pay | Admitting: Internal Medicine

## 2018-05-03 VITALS — BP 135/64 | HR 103 | Temp 97.9°F | Resp 17 | Ht 66.0 in | Wt 170.7 lb

## 2018-05-03 DIAGNOSIS — R0609 Other forms of dyspnea: Secondary | ICD-10-CM | POA: Diagnosis not present

## 2018-05-03 DIAGNOSIS — J90 Pleural effusion, not elsewhere classified: Secondary | ICD-10-CM | POA: Diagnosis not present

## 2018-05-03 DIAGNOSIS — C182 Malignant neoplasm of ascending colon: Secondary | ICD-10-CM | POA: Diagnosis not present

## 2018-05-03 DIAGNOSIS — C3490 Malignant neoplasm of unspecified part of unspecified bronchus or lung: Secondary | ICD-10-CM

## 2018-05-03 DIAGNOSIS — C3411 Malignant neoplasm of upper lobe, right bronchus or lung: Secondary | ICD-10-CM

## 2018-05-03 NOTE — Telephone Encounter (Signed)
Scheduled appt per 6/6 los - sent reminder letter in the mail with appt date and time. Lab and ct scan in 6 months with f/u.

## 2018-05-03 NOTE — Progress Notes (Signed)
[place] Telephone:([phone]) [phone]   Fax:(336) [phone]  OFFICE PROGRESS NOTE  Hoyt Koch, MD [place] [address]  DIAGNOSIS:  1) Stage IIIA (T1a, N2, M0) non-small cell lung cancer Non-small cell carcinoma of lung, adenocarcinoma presented with right upper lobe lung mass and mediastinal lymphadenopathy diagnosed in May 2016. 2) stage II (T3, N0, MX) well-differentiated invasive colon adenocarcinoma diagnosed in February 2018.   PRIOR THERAPY:  1) Concurrent chemoradiation with chemotherapy in the form of weekly carboplatin for an AUC of 2 and paclitaxel at 45 mg/m given concurrent with radiation. Last dose was given 06/18/2015 with partial response. 2) status post laparoscopic partial colectomy for ascending colon cancer under the care of Dr. Marcello Moores on 02/08/2017.  CURRENT THERAPY: Observation.  INTERVAL HISTORY: Kalley Nicholl 83 y.o. female returns to the clinic today for follow-up visit accompanied by her daughter.  The patient is feeling fine today with no specific complaints except for shortness of breath with exertion.  She denied having any chest pain, cough or hemoptysis.  She denied having any recent weight loss or night sweats.  She has no nausea, vomiting, diarrhea or constipation.  She had a repeat CT scan of the chest performed recently and she is here for evaluation and discussion of her risk her results.  MEDICAL HISTORY: Past Medical History:  Diagnosis Date  . AF (atrial fibrillation) ([place])   . Anxiety   . Arthritis    "thighs, hips, arms" (04/28/2015)  . Breast cancer ([place])    right mastectomy  . CAD (coronary artery disease)   . Dehydration 12/06/2016  . Dementia   . Depression   . Diverticulitis   . GERD (gastroesophageal reflux disease)   . HCAP (healthcare-associated pneumonia) 09/15/2015  . Hyperlipidemia   . Hypertension   . Non-small cell carcinoma of lung, stage 3 ([place]) 04/30/2015  . Prediabetes   .  Seasonal allergies   . Shortness of breath dyspnea   . Sleep apnea     dx'd 10/2014,refused cpap machine  . Urinary frequency   . UTI (urinary tract infection) 06/06/2015    ALLERGIES:  has No Known Allergies.  MEDICATIONS:  Current Outpatient Medications  Medication Sig Dispense Refill  . albuterol (PROVENTIL HFA;VENTOLIN HFA) 108 (90 Base) MCG/ACT inhaler Inhale 1 puff into the lungs every 6 (six) hours as needed for wheezing or shortness of breath. Reported on 06/01/2016 18 g 1  . atorvastatin (LIPITOR) 40 MG tablet TAKE 1 TABLET(40 MG) BY MOUTH DAILY 90 tablet 1  . CALCIUM-VITAMIN D PO Take 1 tablet by mouth daily.    Marland Kitchen DIGOX 125 MCG tablet TAKE 1 TABLET(0.125 MG) BY MOUTH DAILY 30 tablet 5  . diltiazem (CARDIZEM CD) 240 MG 24 hr capsule Take 1 capsule (240 mg total) by mouth daily. 90 capsule 3  . ELIQUIS 5 MG TABS tablet TAKE 1 TABLET(5 MG) BY MOUTH TWICE DAILY 180 tablet 1  . escitalopram (LEXAPRO) 10 MG tablet TAKE 1 TABLET(10 MG) BY MOUTH DAILY. FOLLOW-UP APPOINTMENT IS DUE 30 tablet 4  . ferrous sulfate 325 (65 FE) MG tablet Take 325 mg by mouth daily with breakfast.    . furosemide (LASIX) 20 MG tablet Take 1 tablet (20 mg total) by mouth daily. 90 tablet 3  . ipratropium-albuterol (DUONEB) 0.5-2.5 (3) MG/3ML SOLN Take 3 mLs by nebulization 4 (four) times daily as needed. 360 mL 11  . loratadine (CLARITIN) 10 MG tablet Take 1 tablet (10 mg  total) by mouth at bedtime. 90 tablet 3  . LORazepam (ATIVAN) 2 MG tablet TAKE 1 TABLET BY MOUTH AT BEDTIME AS NEEDED FOR ANXIETY (Patient taking differently: Take 1 mg by mouth every 6 (six) hours as needed for sleep. TAKE 1 TABLET BY MOUTH AT BEDTIME AS NEEDED FOR ANXIETY) 30 tablet 1  . mirtazapine (REMERON) 15 MG tablet TAKE 1 TABLET(15 MG) BY MOUTH AT BEDTIME 90 tablet 1  . Multiple Vitamins-Minerals (MULTIVITAMIN WITH MINERALS) tablet Take 1 tablet by mouth daily. gummie    . NUTRITIONAL SUPPLEMENT LIQD Take 120 mLs by mouth 3 (three) times  daily. MedPass    . oxyCODONE (OXY IR/ROXICODONE) 5 MG immediate release tablet Take 0.5-1 tablets (2.5-5 mg total) by mouth every 6 (six) hours as needed for severe pain. 30 tablet 0  . OXYGEN Inhale 2 L into the lungs as needed.    . pantoprazole (PROTONIX) 40 MG tablet Take 1 tablet (40 mg total) by mouth daily. 90 tablet 1  . tolterodine (DETROL LA) 4 MG 24 hr capsule TK 1 C PO QD  11   No current facility-administered medications for this visit.     SURGICAL HISTORY:  Past Surgical History:  Procedure Laterality Date  . BREAST SURGERY    . CARDIOVERSION    . Cataract surgery    . CESAREAN SECTION  X 2  . COLON SURGERY    . COLONOSCOPY    . COLONOSCOPY N/A 01/13/2017   Procedure: COLONOSCOPY;  Surgeon: Gatha Mayer, MD;  Location: [facility];  Service: Endoscopy;  Laterality: N/A;  . ESOPHAGOGASTRODUODENOSCOPY N/A 01/13/2017   Procedure: ESOPHAGOGASTRODUODENOSCOPY (EGD);  Surgeon: Gatha Mayer, MD;  Location: [facility];  Service: Endoscopy;  Laterality: N/A;  . LAPAROSCOPIC PARTIAL COLECTOMY N/A 02/08/2017   Procedure: LAPAROSCOPIC PARTIAL COLECTOMY;  Surgeon: Leighton Ruff, MD;  Location: [facility] ORS;  Service: General;  Laterality: N/A;  . MASTECTOMY Right   . MEDIASTINOSCOPY N/A 04/28/2015   Procedure: MEDIASTINOSCOPY;  Surgeon: [name], MD;  Location: [facility] OR;  Service: Thoracic;  Laterality: N/A;  . TONSILLECTOMY      REVIEW OF SYSTEMS:  A comprehensive review of systems was negative except for: Respiratory: positive for dyspnea on exertion   PHYSICAL EXAMINATION: General appearance: alert, cooperative and no distress Head: Normocephalic, without obvious abnormality, atraumatic Neck: no adenopathy, no JVD, supple, symmetrical, trachea midline and thyroid not enlarged, symmetric, no tenderness/mass/nodules Lymph nodes: Cervical, supraclavicular, and axillary nodes normal. Resp: diminished breath sounds RLL and dullness to percussion RLL Back: symmetric, no curvature.  ROM normal. No CVA tenderness. Cardio: regular rate and rhythm, S1, S2 normal, no murmur, click, rub or gallop GI: soft, non-tender; bowel sounds normal; no masses,  no organomegaly Extremities: extremities normal, atraumatic, no cyanosis or edema  ECOG PERFORMANCE STATUS: 1 - Symptomatic but completely ambulatory  Blood pressure 135/64, pulse (!) 103, temperature 97.9 F (36.6 C), temperature source Oral, resp. rate 17, height 5\' 6"  (1.676 m), weight 170 lb 11.2 oz (77.4 kg), SpO2 90 %.  LABORATORY DATA: Lab Results  Component Value Date   WBC 9.2 05/01/2018   HGB 13.5 05/01/2018   HCT 42.0 05/01/2018   MCV 95.0 05/01/2018   PLT 325 05/01/2018      Chemistry      Component Value Date/Time   NA 142 05/01/2018 1055   NA 140 07/04/2017 1244   K 4.5 05/01/2018 1055   K 5.0 07/04/2017 1244   CL 101 05/01/2018 1055   CO2  30 (H) 05/01/2018 1055   CO2 28 07/04/2017 1244   BUN 11 05/01/2018 1055   BUN 16.8 07/04/2017 1244   CREATININE 1.02 05/01/2018 1055   CREATININE 1.1 07/04/2017 1244   GLU 123 02/17/2017      Component Value Date/Time   CALCIUM 9.5 05/01/2018 1055   CALCIUM 9.9 07/04/2017 1244   ALKPHOS 67 05/01/2018 1055   ALKPHOS 64 07/04/2017 1244   AST 20 05/01/2018 1055   AST 15 07/04/2017 1244   ALT 19 05/01/2018 1055   ALT 15 07/04/2017 1244   BILITOT 0.6 05/01/2018 1055   BILITOT 0.49 07/04/2017 1244       RADIOGRAPHIC STUDIES: Ct Chest W Contrast  Result Date: 05/01/2018 CLINICAL DATA:  Followup right lung non-small-cell carcinoma and colon carcinoma. Status post surgery, chemotherapy, and radiation therapy. Chest pain for 1 week. Personal history of right breast carcinoma. EXAM: CT CHEST WITH CONTRAST TECHNIQUE: Multidetector CT imaging of the chest was performed during intravenous contrast administration. CONTRAST:  70mL OMNIPAQUE IOHEXOL 300 MG/ML  SOLN COMPARISON:  01/02/2018 FINDINGS: Cardiovascular: No acute findings. Aortic and coronary artery  atherosclerosis. Mediastinum/Nodes: Mild mediastinal lymphadenopathy in pretracheal region shows no significant change, with largest lymph node measuring 10 mm on image 58/2. No new or increased areas of lymphadenopathy identified. Lungs/Pleura: Large right pleural effusion shows further increase in size since previous study. Increased compressive atelectasis noted. Stable post radiation changes seen in the medial aspect of the right upper lobe. Mild emphysema again noted. Previously seen pulmonary nodule in the anterior right lower lobe is no longer visualized on today's exam. Stable tiny sub-cm pulmonary nodule in the medial left lung apex. a no new or enlarging pulmonary nodules or masses are identified. No evidence of pulmonary consolidation or central airway obstruction. Upper Abdomen: No acute findings. Stable tiny left adrenal nodule dating back to previous studies from 2016, consistent with benign adrenal adenoma. Musculoskeletal: No suspicious bone lesions. Old L1 vertebral body compression is deformity shows no significant change. IMPRESSION: Resolution of pulmonary nodule in anterior right lower lobe since previous study. Stable mild mediastinal lymphadenopathy. No new or enlarging masses or lymphadenopathy. Mild increase in large right pleural effusion and compressive atelectasis. Aortic Atherosclerosis (ICD10-I70.0) and Emphysema (ICD10-J43.9). Coronary artery calcification. Electronically Signed   By: Earle Gell M.D.   On: 05/01/2018 15:37    ASSESSMENT AND PLAN:  This is a very pleasant 83 years old white female with: 1) Stage IIIA non-small cell lung cancer, adenocarcinoma status post concurrent chemoradiation with partial response and she was not considered a good candidate for consolidation chemotherapy. The patient is currently on observation. The patient has a repeat CT scan of the chest that showed no concerning findings for disease progression except for the enlarging right pleural  effusion.  I discussed the scan results with the patient and her daughter and recommended for her to proceed with ultrasound-guided right thoracentesis for diagnostic and therapeutic purposes.  If the cytology of the pleural fluid is negative for malignancy, I will see her back for follow-up visit in 6 months for evaluation with repeat CT scan of the chest. 2) stage II (T3, N0, Mx) colon adenocarcinoma status post partial colectomy with lymph node dissection. The patient will continue on observation for now. She was advised to call immediately if she has any concerning symptoms in the interval. The patient voices understanding of current disease status and treatment options and is in agreement with the current care plan. All questions were answered. The patient  knows to call the clinic with any problems, questions or concerns. We can certainly see the patient much sooner if necessary.  Disclaimer: This note was dictated with voice recognition software. Similar sounding words can inadvertently be transcribed and may not be corrected upon review.

## 2018-05-16 ENCOUNTER — Ambulatory Visit (INDEPENDENT_AMBULATORY_CARE_PROVIDER_SITE_OTHER): Payer: Medicare HMO | Admitting: *Deleted

## 2018-05-16 VITALS — BP 126/62 | HR 84 | Resp 21 | Ht 66.0 in | Wt 169.0 lb

## 2018-05-16 DIAGNOSIS — Z0000 Encounter for general adult medical examination without abnormal findings: Secondary | ICD-10-CM | POA: Diagnosis not present

## 2018-05-16 NOTE — Patient Instructions (Signed)
Continue doing brain stimulating activities (puzzles, reading, adult coloring books, staying active) to keep memory sharp.   Continue to eat heart healthy diet (full of fruits, vegetables, whole grains, lean protein, water--limit salt, fat, and sugar intake) and increase physical activity as tolerated.   Pamela Mcguire , Thank you for taking time to come for your Medicare Wellness Visit. I appreciate your ongoing commitment to your health goals. Please review the following plan we discussed and let me know if I can assist you in the future.   These are the goals we discussed: Goals    . Patient Stated     Become more active at [facility] by participate in chair exercises, going to movie night, going on bus tours and shopping.       This is a list of the screening recommended for you and due dates:  Health Maintenance  Topic Date Due  . DEXA scan (bone density measurement)  12/30/2001  . Tetanus Vaccine  07/10/2018*  . Flu Shot  06/28/2018  . Pneumonia vaccines  Completed  *Topic was postponed. The date shown is not the original due date.     It is important to avoid accidents which may result in broken bones.  Here are a few ideas on how to make your home safer so you will be less likely to trip or fall.  1. Use nonskid mats or non slip strips in your shower or tub, on your bathroom floor and around sinks.  If you know that you have spilled water, wipe it up! 2. In the bathroom, it is important to have properly installed grab bars on the walls or on the edge of the tub.  Towel racks are NOT strong enough for you to hold onto or to pull on for support. 3. Stairs and hallways should have enough light.  Add lamps or night lights if you need ore light. 4. It is good to have handrails on both sides of the stairs if possible.  Always fix broken handrails right away. 5. It is important to see the edges of steps.  Paint the edges of outdoor steps white so you can see them better.  Put  colored tape on the edge of inside steps. 6. Throw-rugs are dangerous because they can slide.  Removing the rugs is the best idea, but if they must stay, add adhesive carpet tape to prevent slipping. 7. Do not keep things on stairs or in the halls.  Remove small furniture that blocks the halls as it may cause you to trip.  Keep telephone and electrical cords out of the way where you walk. 8. Always were sturdy, rubber-soled shoes for good support.  Never wear just socks, especially on the stairs.  Socks may cause you to slip or fall.  Do not wear full-length housecoats as you can easily trip on the bottom.  9. Place the things you use the most on the shelves that are the easiest to reach.  If you use a stepstool, make sure it is in good condition.  If you feel unsteady, DO NOT climb, ask for help. 10. If a health professional advises you to use a cane or walker, do not be ashamed.  These items can keep you from falling and breaking your bones.

## 2018-05-16 NOTE — Progress Notes (Addendum)
Subjective:   Pamela Mcguire is a 83 y.o. female who presents for Medicare Annual (Subsequent) preventive examination.  Review of Systems:  No ROS.  Medicare Wellness Visit. Additional risk factors are reflected in the social history.  Cardiac Risk Factors include: advanced age (>76men, >73 women);dyslipidemia;hypertension Sleep patterns: has frequent nighttime awakenings, has daytime sleepiness, gets up 2-3 times nightly to void and sleep hours varies nightly.    Home Safety/Smoke Alarms: Feels safe in home. Smoke alarms in place.  Living environment; residence and Adult nurse: apartment, equipment: Walkers, Type: Conservation officer, nature, [employer], Type: Tub Surveyor, quantity and wheelchair, no firearms. Resides at [facility] independent living, Lives alone, no needs for DME, good support system Seat Belt Safety/Bike Helmet: Wears seat belt.      Objective:     Vitals: BP 126/62   Pulse 84   Resp (!) 21   Ht 5\' 6"  (1.676 m)   Wt 169 lb (76.7 kg)   LMP  (LMP Unknown)   SpO2 93%   BMI 27.28 kg/m   Body mass index is 27.28 kg/m.  Advanced Directives 05/16/2018 05/03/2018 08/26/2017 08/05/2017 07/06/2017 02/12/2017 02/08/2017  Does Patient Have a Medical Advance Directive? No No No No Yes Yes -  Type of Advance Directive - [place];Living will - - Out of facility DNR (pink MOST or yellow form) - Press photographer;Living will  Does patient want to make changes to medical advance directive? - - - - - Yes (Inpatient - patient defers changing a medical advance directive at this time) -  Copy of [place] in Chart? - No - copy requested - - - - -  Would patient like information on creating a medical advance directive? Yes (ED - Information included in AVS) - No - Patient declined No - Patient declined - No - Patient declined -  Pre-existing out of facility DNR order (yellow form or pink MOST form) - - - - - - -    Tobacco Social History   Tobacco  Use  Smoking Status Former Smoker  . Packs/day: 1.00  . Years: 35.00  . Pack years: 35.00  . Types: Cigarettes  . Last attempt to quit: 11/28/1992  . Years since quitting: 25.4  Smokeless Tobacco Never Used  Tobacco Comment   "stopped smoking at age 47"     Counseling given: Not Answered Comment: "stopped smoking at age 45"  Past Medical History:  Diagnosis Date  . AF (atrial fibrillation) ([place])   . Anxiety   . Arthritis    "thighs, hips, arms" (04/28/2015)  . Breast cancer ([place])    right mastectomy  . CAD (coronary artery disease)   . Dehydration 12/06/2016  . Dementia   . Depression   . Diverticulitis   . GERD (gastroesophageal reflux disease)   . HCAP (healthcare-associated pneumonia) 09/15/2015  . Hyperlipidemia   . Hypertension   . Non-small cell carcinoma of lung, stage 3 ([place]) 04/30/2015  . Prediabetes   . Seasonal allergies   . Shortness of breath dyspnea   . Sleep apnea     dx'd 10/2014,refused cpap machine  . Urinary frequency   . UTI (urinary tract infection) 06/06/2015   Past Surgical History:  Procedure Laterality Date  . BREAST SURGERY    . CARDIOVERSION    . Cataract surgery    . CESAREAN SECTION  X 2  . COLON SURGERY    . COLONOSCOPY    . COLONOSCOPY N/A 01/13/2017  Procedure: COLONOSCOPY;  Surgeon: Gatha Mayer, MD;  Location: [facility];  Service: Endoscopy;  Laterality: N/A;  . ESOPHAGOGASTRODUODENOSCOPY N/A 01/13/2017   Procedure: ESOPHAGOGASTRODUODENOSCOPY (EGD);  Surgeon: Gatha Mayer, MD;  Location: [facility];  Service: Endoscopy;  Laterality: N/A;  . LAPAROSCOPIC PARTIAL COLECTOMY N/A 02/08/2017   Procedure: LAPAROSCOPIC PARTIAL COLECTOMY;  Surgeon: Leighton Ruff, MD;  Location: [facility];  Service: General;  Laterality: N/A;  . MASTECTOMY Right   . MEDIASTINOSCOPY N/A 04/28/2015   Procedure: MEDIASTINOSCOPY;  Surgeon: [name], MD;  Location: [facility] OR;  Service: Thoracic;  Laterality: N/A;  . TONSILLECTOMY     Family History  Problem  Relation Age of Onset  . Mental illness Mother    Social History   Socioeconomic History  . Marital status: Widowed    Spouse name: Not on file  . Number of children: 4  . Years of education: 31  . Highest education level: Not on file  Occupational History  . Occupation: retired  Scientific laboratory technician  . Financial resource strain: Not hard at all  . Food insecurity:    Worry: Never true    Inability: Never true  . Transportation needs:    Medical: No    Non-medical: No  Tobacco Use  . Smoking status: Former Smoker    Packs/day: 1.00    Years: 35.00    Pack years: 35.00    Types: Cigarettes    Last attempt to quit: 11/28/1992    Years since quitting: 25.4  . Smokeless tobacco: Never Used  . Tobacco comment: "stopped smoking at age 57"  Substance and Sexual Activity  . Alcohol use: Yes    Alcohol/week: 5.4 oz    Types: 9 Glasses of wine per week    Comment: 04/28/2015 "drink 3, 6oz,  glasses of wine on Sat & Sun"  . Drug use: No  . Sexual activity: Never  Lifestyle  . Physical activity:    Days per week: 0 days    Minutes per session: 0 min  . Stress: To some extent  Relationships  . Social connections:    Talks on phone: More than three times a week    Gets together: More than three times a week    Attends religious service: Not on file    Active member of club or organization: Not on file    Attends meetings of clubs or organizations: Not on file    Relationship status: Widowed  Other Topics Concern  . Not on file  Social History Narrative   Fun: Word search and read   Denies abuse and feels safe at home    Outpatient Encounter Medications as of 05/16/2018  Medication Sig  . albuterol (PROVENTIL HFA;VENTOLIN HFA) 108 (90 Base) MCG/ACT inhaler Inhale 1 puff into the lungs every 6 (six) hours as needed for wheezing or shortness of breath. Reported on 06/01/2016  . atorvastatin (LIPITOR) 40 MG tablet TAKE 1 TABLET(40 MG) BY MOUTH DAILY  . CALCIUM-VITAMIN D PO Take 1 tablet  by mouth daily.  Marland Kitchen DIGOX 125 MCG tablet TAKE 1 TABLET(0.125 MG) BY MOUTH DAILY  . diltiazem (CARDIZEM CD) 240 MG 24 hr capsule Take 1 capsule (240 mg total) by mouth daily.  Marland Kitchen ELIQUIS 5 MG TABS tablet TAKE 1 TABLET(5 MG) BY MOUTH TWICE DAILY  . escitalopram (LEXAPRO) 10 MG tablet TAKE 1 TABLET(10 MG) BY MOUTH DAILY. FOLLOW-UP APPOINTMENT IS DUE  . ferrous sulfate 325 (65 FE) MG tablet Take 325 mg by mouth  daily with breakfast.  . furosemide (LASIX) 20 MG tablet Take 1 tablet (20 mg total) by mouth daily.  Marland Kitchen ipratropium-albuterol (DUONEB) 0.5-2.5 (3) MG/3ML SOLN Take 3 mLs by nebulization 4 (four) times daily as needed.  . loratadine (CLARITIN) 10 MG tablet Take 1 tablet (10 mg total) by mouth at bedtime.  Marland Kitchen LORazepam (ATIVAN) 2 MG tablet TAKE 1 TABLET BY MOUTH AT BEDTIME AS NEEDED FOR ANXIETY (Patient taking differently: Take 1 mg by mouth every 6 (six) hours as needed for sleep. TAKE 1 TABLET BY MOUTH AT BEDTIME AS NEEDED FOR ANXIETY)  . mirtazapine (REMERON) 15 MG tablet TAKE 1 TABLET(15 MG) BY MOUTH AT BEDTIME  . Multiple Vitamins-Minerals (MULTIVITAMIN WITH MINERALS) tablet Take 1 tablet by mouth daily. gummie  . NUTRITIONAL SUPPLEMENT LIQD Take 120 mLs by mouth 3 (three) times daily. MedPass  . oxyCODONE (OXY IR/ROXICODONE) 5 MG immediate release tablet Take 0.5-1 tablets (2.5-5 mg total) by mouth every 6 (six) hours as needed for severe pain.  . OXYGEN Inhale 2 L into the lungs as needed.  . pantoprazole (PROTONIX) 40 MG tablet Take 1 tablet (40 mg total) by mouth daily.  Marland Kitchen tolterodine (DETROL LA) 4 MG 24 hr capsule TK 1 C PO QD   No facility-administered encounter medications on file as of 05/16/2018.     Activities of Daily Living In your present state of health, do you have any difficulty performing the following activities: 05/16/2018 08/26/2017  Hearing? N N  Vision? N N  Difficulty concentrating or making decisions? Y N  Walking or climbing stairs? Y Y  Dressing or bathing? N N    Doing errands, shopping? Pamela Mcguire  Preparing Food and eating ? Y -  Using the Toilet? N -  In the past six months, have you accidently leaked urine? N -  Do you have problems with loss of bowel control? N -  Managing your Medications? Y -  Managing your Finances? Y -  Housekeeping or managing your Housekeeping? Y -  Some recent data might be hidden    Patient Care Team: Hoyt Koch, MD as PCP - General (Internal Medicine) Debara Pickett Nadean Corwin, MD as PCP - Cardiology (Cardiology) Gardiner Barefoot, DPM as Consulting Physician (Podiatry) Curt Bears, MD as Consulting Physician (Oncology)    Assessment:   This is a routine wellness examination for Pamela Mcguire. Physical assessment deferred to PCP.   Exercise Activities and Dietary recommendations Current Exercise Habits: The patient does not participate in regular exercise at present  Diet (meal preparation, eat out, water intake, caffeinated beverages, dairy products, fruits and vegetables): in general, a "healthy" diet  , well balanced   Reviewed heart healthy diet. Encouraged patient to increase daily water and fluid intake.  Goals    . Patient Stated     Become more active at [facility] by participate in chair exercises, going to movie night, going on bus tours and shopping.       Fall Risk Fall Risk  05/16/2018 07/10/2017 08/13/2015  Falls in the past year? No No Yes  Number falls in past yr: - - 1  Injury with Fall? - - Yes  Risk Factor Category  - - High Fall Risk  Risk for fall due to : Impaired balance/gait;Impaired mobility - -  Follow up - - Falls prevention discussed    Depression Screen PHQ 2/9 Scores 05/16/2018 07/10/2017 08/13/2015  PHQ - 2 Score 4 0 0  PHQ- 9 Score 11 - -  Cognitive Function MMSE - Mini Mental State Exam 05/16/2018  Orientation to time 4  Orientation to Place 5  Registration 1  Attention/ Calculation 3  Recall 1  Language- name 2 objects 2  Language- repeat 1  Language- follow 3  step command 3  Language- read & follow direction 1  Write a sentence 1  Copy design 1  Total score 23        Immunization History  Administered Date(s) Administered  . Influenza, High Dose Seasonal PF 08/18/2015, 08/22/2017  . Influenza-Unspecified 09/28/2013  . PPD Test 09/17/2015, 02/15/2017, 03/01/2017  . Pneumococcal Conjugate-13 09/28/2013  . Pneumococcal Polysaccharide-23 04/29/2015   Screening Tests Health Maintenance  Topic Date Due  . DEXA SCAN  12/30/2001  . TETANUS/TDAP  07/10/2018 (Originally 12/31/1953)  . INFLUENZA VACCINE  06/28/2018  . PNA vac Low Risk Adult  Completed      Plan:    Continue doing brain stimulating activities (puzzles, reading, adult coloring books, staying active) to keep memory sharp.   Continue to eat heart healthy diet (full of fruits, vegetables, whole grains, lean protein, water--limit salt, fat, and sugar intake) and increase physical activity as tolerated.  I have personally reviewed and noted the following in the patient's chart:   . Medical and social history . Use of alcohol, tobacco or illicit drugs  . Current medications and supplements . Functional ability and status . Nutritional status . Physical activity . Advanced directives . List of other physicians . Vitals . Screenings to include cognitive, depression, and falls . Referrals and appointments  In addition, I have reviewed and discussed with patient certain preventive protocols, quality metrics, and best practice recommendations. A written personalized care plan for preventive services as well as general preventive health recommendations were provided to patient.     Michiel Cowboy, RN  05/16/2018   Medical screening examination/treatment/procedure(s) were performed by non-physician practitioner and as supervising provider I was immediately available for consultation/collaboration.  I agree with above. Marrian Salvage, FNP

## 2018-06-05 ENCOUNTER — Ambulatory Visit (HOSPITAL_COMMUNITY)
Admission: RE | Admit: 2018-06-05 | Discharge: 2018-06-05 | Disposition: A | Discharge status: Home / Self Care | Payer: Medicare HMO | Source: Ambulatory Visit | Attending: Radiology | Admitting: Radiology

## 2018-06-05 ENCOUNTER — Ambulatory Visit (HOSPITAL_COMMUNITY)
Admission: RE | Admit: 2018-06-05 | Discharge: 2018-06-05 | Disposition: A | Discharge status: Home / Self Care | Payer: Medicare HMO | Source: Ambulatory Visit | Attending: Internal Medicine | Admitting: Internal Medicine

## 2018-06-05 DIAGNOSIS — Z9889 Other specified postprocedural states: Secondary | ICD-10-CM

## 2018-06-05 DIAGNOSIS — J90 Pleural effusion, not elsewhere classified: Secondary | ICD-10-CM | POA: Insufficient documentation

## 2018-06-05 DIAGNOSIS — C3490 Malignant neoplasm of unspecified part of unspecified bronchus or lung: Secondary | ICD-10-CM | POA: Insufficient documentation

## 2018-06-05 MED ORDER — LIDOCAINE HCL 1 % IJ SOLN
INTRAMUSCULAR | Status: AC
  Filled 2018-06-05: qty 20

## 2018-06-05 NOTE — Procedures (Signed)
Ultrasound-guided diagnostic and therapeutic right thoracentesis performed yielding 1.6 liters of hazy, yellow fluid. No immediate complications. Follow-up chest x-ray pending. A portion of the fluid was sent to the lab for cytology.

## 2018-06-06 ENCOUNTER — Other Ambulatory Visit: Payer: Self-pay | Admitting: *Deleted

## 2018-06-06 MED ORDER — ATORVASTATIN CALCIUM 40 MG PO TABS: ORAL_TABLET | ORAL | 1 refills | Status: DC

## 2018-06-06 MED ORDER — TOLTERODINE TARTRATE ER 4 MG PO CP24: 4.0000 mg | ORAL_CAPSULE | Freq: Every day | ORAL | 1 refills | Status: AC

## 2018-06-06 MED ORDER — MIRTAZAPINE 15 MG PO TABS: ORAL_TABLET | ORAL | 1 refills | Status: DC

## 2018-06-11 ENCOUNTER — Other Ambulatory Visit: Payer: Self-pay | Admitting: Internal Medicine

## 2018-07-10 ENCOUNTER — Other Ambulatory Visit: Payer: Self-pay | Admitting: Internal Medicine

## 2018-08-05 ENCOUNTER — Other Ambulatory Visit: Payer: Self-pay | Admitting: Physician Assistant

## 2018-08-24 ENCOUNTER — Inpatient Hospital Stay (HOSPITAL_COMMUNITY)
Admission: EM | Admit: 2018-08-24 | Discharge: 2018-08-30 | DRG: 292 | Disposition: A | Discharge status: Home / Self Care | Payer: Medicare HMO | Attending: Internal Medicine | Admitting: Internal Medicine

## 2018-08-24 ENCOUNTER — Encounter: Payer: Self-pay | Admitting: Family

## 2018-08-24 ENCOUNTER — Emergency Department (HOSPITAL_COMMUNITY): Payer: Medicare HMO

## 2018-08-24 ENCOUNTER — Other Ambulatory Visit: Payer: Self-pay

## 2018-08-24 ENCOUNTER — Encounter (HOSPITAL_COMMUNITY): Payer: Self-pay | Admitting: *Deleted

## 2018-08-24 ENCOUNTER — Encounter (HOSPITAL_COMMUNITY): Payer: Self-pay

## 2018-08-24 ENCOUNTER — Ambulatory Visit (INDEPENDENT_AMBULATORY_CARE_PROVIDER_SITE_OTHER): Payer: Medicare HMO | Admitting: Family

## 2018-08-24 VITALS — BP 140/78 | HR 84 | Temp 98.1°F

## 2018-08-24 DIAGNOSIS — E785 Hyperlipidemia, unspecified: Secondary | ICD-10-CM | POA: Diagnosis present

## 2018-08-24 DIAGNOSIS — M160 Bilateral primary osteoarthritis of hip: Secondary | ICD-10-CM | POA: Diagnosis present

## 2018-08-24 DIAGNOSIS — F329 Major depressive disorder, single episode, unspecified: Secondary | ICD-10-CM | POA: Diagnosis present

## 2018-08-24 DIAGNOSIS — R079 Chest pain, unspecified: Secondary | ICD-10-CM

## 2018-08-24 DIAGNOSIS — I110 Hypertensive heart disease with heart failure: Principal | ICD-10-CM | POA: Diagnosis present

## 2018-08-24 DIAGNOSIS — Z9889 Other specified postprocedural states: Secondary | ICD-10-CM | POA: Diagnosis not present

## 2018-08-24 DIAGNOSIS — C3431 Malignant neoplasm of lower lobe, right bronchus or lung: Secondary | ICD-10-CM | POA: Diagnosis not present

## 2018-08-24 DIAGNOSIS — Z7901 Long term (current) use of anticoagulants: Secondary | ICD-10-CM

## 2018-08-24 DIAGNOSIS — J948 Other specified pleural conditions: Secondary | ICD-10-CM | POA: Diagnosis present

## 2018-08-24 DIAGNOSIS — R7303 Prediabetes: Secondary | ICD-10-CM | POA: Diagnosis present

## 2018-08-24 DIAGNOSIS — F419 Anxiety disorder, unspecified: Secondary | ICD-10-CM | POA: Diagnosis present

## 2018-08-24 DIAGNOSIS — C189 Malignant neoplasm of colon, unspecified: Secondary | ICD-10-CM

## 2018-08-24 DIAGNOSIS — G4733 Obstructive sleep apnea (adult) (pediatric): Secondary | ICD-10-CM | POA: Diagnosis present

## 2018-08-24 DIAGNOSIS — N390 Urinary tract infection, site not specified: Secondary | ICD-10-CM | POA: Diagnosis present

## 2018-08-24 DIAGNOSIS — Z853 Personal history of malignant neoplasm of breast: Secondary | ICD-10-CM

## 2018-08-24 DIAGNOSIS — R0902 Hypoxemia: Secondary | ICD-10-CM | POA: Diagnosis not present

## 2018-08-24 DIAGNOSIS — Z66 Do not resuscitate: Secondary | ICD-10-CM | POA: Diagnosis present

## 2018-08-24 DIAGNOSIS — J449 Chronic obstructive pulmonary disease, unspecified: Secondary | ICD-10-CM | POA: Diagnosis present

## 2018-08-24 DIAGNOSIS — I10 Essential (primary) hypertension: Secondary | ICD-10-CM | POA: Diagnosis not present

## 2018-08-24 DIAGNOSIS — C50919 Malignant neoplasm of unspecified site of unspecified female breast: Secondary | ICD-10-CM | POA: Diagnosis not present

## 2018-08-24 DIAGNOSIS — I4891 Unspecified atrial fibrillation: Secondary | ICD-10-CM | POA: Diagnosis present

## 2018-08-24 DIAGNOSIS — Z791 Long term (current) use of non-steroidal anti-inflammatories (NSAID): Secondary | ICD-10-CM

## 2018-08-24 DIAGNOSIS — E538 Deficiency of other specified B group vitamins: Secondary | ICD-10-CM | POA: Diagnosis present

## 2018-08-24 DIAGNOSIS — D509 Iron deficiency anemia, unspecified: Secondary | ICD-10-CM | POA: Diagnosis present

## 2018-08-24 DIAGNOSIS — F0390 Unspecified dementia without behavioral disturbance: Secondary | ICD-10-CM | POA: Diagnosis present

## 2018-08-24 DIAGNOSIS — I480 Paroxysmal atrial fibrillation: Secondary | ICD-10-CM | POA: Diagnosis present

## 2018-08-24 DIAGNOSIS — N3000 Acute cystitis without hematuria: Secondary | ICD-10-CM | POA: Diagnosis not present

## 2018-08-24 DIAGNOSIS — Z9011 Acquired absence of right breast and nipple: Secondary | ICD-10-CM

## 2018-08-24 DIAGNOSIS — Z79899 Other long term (current) drug therapy: Secondary | ICD-10-CM

## 2018-08-24 DIAGNOSIS — C182 Malignant neoplasm of ascending colon: Secondary | ICD-10-CM | POA: Diagnosis not present

## 2018-08-24 DIAGNOSIS — K219 Gastro-esophageal reflux disease without esophagitis: Secondary | ICD-10-CM | POA: Diagnosis present

## 2018-08-24 DIAGNOSIS — J9611 Chronic respiratory failure with hypoxia: Secondary | ICD-10-CM | POA: Diagnosis present

## 2018-08-24 DIAGNOSIS — Z87891 Personal history of nicotine dependence: Secondary | ICD-10-CM

## 2018-08-24 DIAGNOSIS — J90 Pleural effusion, not elsewhere classified: Secondary | ICD-10-CM

## 2018-08-24 DIAGNOSIS — I2510 Atherosclerotic heart disease of native coronary artery without angina pectoris: Secondary | ICD-10-CM | POA: Diagnosis present

## 2018-08-24 DIAGNOSIS — Z9049 Acquired absence of other specified parts of digestive tract: Secondary | ICD-10-CM | POA: Diagnosis not present

## 2018-08-24 DIAGNOSIS — Z818 Family history of other mental and behavioral disorders: Secondary | ICD-10-CM

## 2018-08-24 DIAGNOSIS — Z9981 Dependence on supplemental oxygen: Secondary | ICD-10-CM | POA: Diagnosis not present

## 2018-08-24 DIAGNOSIS — R0602 Shortness of breath: Secondary | ICD-10-CM | POA: Diagnosis present

## 2018-08-24 DIAGNOSIS — Z923 Personal history of irradiation: Secondary | ICD-10-CM

## 2018-08-24 DIAGNOSIS — I482 Chronic atrial fibrillation, unspecified: Secondary | ICD-10-CM | POA: Diagnosis present

## 2018-08-24 DIAGNOSIS — I5033 Acute on chronic diastolic (congestive) heart failure: Secondary | ICD-10-CM | POA: Diagnosis present

## 2018-08-24 DIAGNOSIS — C3411 Malignant neoplasm of upper lobe, right bronchus or lung: Secondary | ICD-10-CM | POA: Diagnosis present

## 2018-08-24 DIAGNOSIS — Z85038 Personal history of other malignant neoplasm of large intestine: Secondary | ICD-10-CM

## 2018-08-24 DIAGNOSIS — I340 Nonrheumatic mitral (valve) insufficiency: Secondary | ICD-10-CM | POA: Diagnosis not present

## 2018-08-24 DIAGNOSIS — Z9221 Personal history of antineoplastic chemotherapy: Secondary | ICD-10-CM | POA: Diagnosis not present

## 2018-08-24 LAB — COMPREHENSIVE METABOLIC PANEL
ALK PHOS: 50 U/L (ref 38–126)
ALT: 13 U/L (ref 0–44)
AST: 20 U/L (ref 15–41)
Albumin: 3.9 g/dL (ref 3.5–5.0)
Anion gap: 13 (ref 5–15)
BUN: 11 mg/dL (ref 8–23)
CALCIUM: 9.3 mg/dL (ref 8.9–10.3)
CHLORIDE: 99 mmol/L (ref 98–111)
CO2: 32 mmol/L (ref 22–32)
CREATININE: 0.85 mg/dL (ref 0.44–1.00)
GFR calc Af Amer: >60 mL/min (ref >60)
GFR calc non Af Amer: >60 mL/min (ref >60)
GLUCOSE: 106 mg/dL — AB (ref 70–99)
Potassium: 3.6 mmol/L (ref 3.5–5.1)
SODIUM: 144 mmol/L (ref 135–145)
Total Bilirubin: 1.2 mg/dL (ref 0.3–1.2)
Total Protein: 7.2 g/dL (ref 6.5–8.1)

## 2018-08-24 LAB — CBC WITH DIFFERENTIAL/PLATELET
BASOS PCT: 0 %
Basophils Absolute: 0 10*3/uL (ref 0.0–0.1)
EOS ABS: 0.1 10*3/uL (ref 0.0–0.7)
Eosinophils Relative: 1 %
HCT: 40.2 % (ref 36.0–46.0)
HEMOGLOBIN: 13.1 g/dL (ref 12.0–15.0)
LYMPHS ABS: 1 10*3/uL (ref 0.7–4.0)
Lymphocytes Relative: 13 %
MCH: 30.5 pg (ref 26.0–34.0)
MCHC: 32.6 g/dL (ref 30.0–36.0)
MCV: 93.5 fL (ref 78.0–100.0)
MONO ABS: 0.5 10*3/uL (ref 0.1–1.0)
MONOS PCT: 7 %
NEUTROS PCT: 79 %
Neutro Abs: 6.2 10*3/uL (ref 1.7–7.7)
Platelets: 368 10*3/uL (ref 150–400)
RBC: 4.3 MIL/uL (ref 3.87–5.11)
RDW: 14.2 % (ref 11.5–15.5)
WBC: 7.8 10*3/uL (ref 4.0–10.5)

## 2018-08-24 LAB — URINALYSIS, COMPLETE (UACMP) WITH MICROSCOPIC
Bilirubin Urine: NEGATIVE
Glucose, UA: NEGATIVE mg/dL
HGB URINE DIPSTICK: NEGATIVE
Ketones, ur: NEGATIVE mg/dL
Nitrite: NEGATIVE
PH: 7 (ref 5.0–8.0)
Protein, ur: NEGATIVE mg/dL
Specific Gravity, Urine: 1.006 (ref 1.005–1.030)
WBC, UA: >50 WBC/hpf — ABNORMAL HIGH (ref 0–5)

## 2018-08-24 LAB — APTT: APTT: 39 s — AB (ref 24–36)

## 2018-08-24 LAB — TROPONIN I

## 2018-08-24 LAB — BRAIN NATRIURETIC PEPTIDE: B Natriuretic Peptide: 156 pg/mL — ABNORMAL HIGH (ref 0.0–100.0)

## 2018-08-24 MED ORDER — ESCITALOPRAM OXALATE 10 MG PO TABS
10.0000 mg | ORAL_TABLET | Freq: Every day | ORAL | Status: DC
  Administered 2018-08-25 – 2018-08-30 (×6): 10 mg via ORAL
  Filled 2018-08-24 (×6): qty 1

## 2018-08-24 MED ORDER — PANTOPRAZOLE SODIUM 40 MG PO TBEC
40.0000 mg | DELAYED_RELEASE_TABLET | Freq: Every day | ORAL | Status: DC
  Administered 2018-08-25 – 2018-08-30 (×6): 40 mg via ORAL
  Filled 2018-08-24 (×6): qty 1

## 2018-08-24 MED ORDER — ZOLPIDEM TARTRATE 5 MG PO TABS
5.0000 mg | ORAL_TABLET | Freq: Every evening | ORAL | Status: DC | PRN
  Administered 2018-08-24 – 2018-08-27 (×4): 5 mg via ORAL
  Filled 2018-08-24 (×4): qty 1

## 2018-08-24 MED ORDER — ATORVASTATIN CALCIUM 40 MG PO TABS
40.0000 mg | ORAL_TABLET | Freq: Every day | ORAL | Status: DC
  Administered 2018-08-25 – 2018-08-29 (×5): 40 mg via ORAL
  Filled 2018-08-24 (×5): qty 1

## 2018-08-24 MED ORDER — FESOTERODINE FUMARATE ER 4 MG PO TB24
4.0000 mg | ORAL_TABLET | Freq: Every day | ORAL | Status: DC
  Administered 2018-08-25 – 2018-08-30 (×6): 4 mg via ORAL
  Filled 2018-08-24 (×6): qty 1

## 2018-08-24 MED ORDER — MIRTAZAPINE 15 MG PO TABS
15.0000 mg | ORAL_TABLET | Freq: Every day | ORAL | Status: DC
  Administered 2018-08-24 – 2018-08-29 (×6): 15 mg via ORAL
  Filled 2018-08-24 (×6): qty 1

## 2018-08-24 MED ORDER — DILTIAZEM HCL ER COATED BEADS 240 MG PO CP24
240.0000 mg | ORAL_CAPSULE | Freq: Every day | ORAL | Status: DC
  Administered 2018-08-25 – 2018-08-30 (×6): 240 mg via ORAL
  Filled 2018-08-24 (×6): qty 1

## 2018-08-24 MED ORDER — FUROSEMIDE 20 MG PO TABS
20.0000 mg | ORAL_TABLET | Freq: Every day | ORAL | Status: DC
  Administered 2018-08-25 – 2018-08-26 (×2): 20 mg via ORAL
  Filled 2018-08-24 (×2): qty 1

## 2018-08-24 MED ORDER — ONDANSETRON HCL 4 MG/2ML IJ SOLN
4.0000 mg | Freq: Four times a day (QID) | INTRAMUSCULAR | Status: DC | PRN
  Administered 2018-08-29: 4 mg via INTRAVENOUS
  Filled 2018-08-24: qty 2

## 2018-08-24 MED ORDER — ALBUTEROL SULFATE HFA 108 (90 BASE) MCG/ACT IN AERS: 1.0000 | INHALATION_SPRAY | Freq: Four times a day (QID) | RESPIRATORY_TRACT | Status: DC | PRN

## 2018-08-24 MED ORDER — IPRATROPIUM BROMIDE 0.02 % IN SOLN: 0.5000 mg | Freq: Four times a day (QID) | RESPIRATORY_TRACT | Status: DC

## 2018-08-24 MED ORDER — IOPAMIDOL (ISOVUE-370) INJECTION 76%
100.0000 mL | Freq: Once | INTRAVENOUS | Status: AC | PRN
  Administered 2018-08-24: 100 mL via INTRAVENOUS

## 2018-08-24 MED ORDER — CEPHALEXIN 500 MG PO CAPS
500.0000 mg | ORAL_CAPSULE | Freq: Once | ORAL | Status: DC
  Filled 2018-08-24: qty 1

## 2018-08-24 MED ORDER — FERROUS SULFATE 325 (65 FE) MG PO TABS
325.0000 mg | ORAL_TABLET | Freq: Every evening | ORAL | Status: DC
  Administered 2018-08-25 – 2018-08-29 (×5): 325 mg via ORAL
  Filled 2018-08-24 (×5): qty 1

## 2018-08-24 MED ORDER — HEPARIN (PORCINE) IN NACL 100-0.45 UNIT/ML-% IJ SOLN
1100.0000 [IU]/h | INTRAMUSCULAR | Status: DC
  Administered 2018-08-24: 1100 [IU]/h via INTRAVENOUS
  Filled 2018-08-24 (×2): qty 250

## 2018-08-24 MED ORDER — SODIUM CHLORIDE 0.9 % IV SOLN
1.0000 g | INTRAVENOUS | Status: DC
  Administered 2018-08-25 – 2018-08-27 (×4): 1 g via INTRAVENOUS
  Filled 2018-08-24: qty 1
  Filled 2018-08-24: qty 10
  Filled 2018-08-24 (×2): qty 1

## 2018-08-24 MED ORDER — ALBUTEROL SULFATE (2.5 MG/3ML) 0.083% IN NEBU: 2.5000 mg | INHALATION_SOLUTION | RESPIRATORY_TRACT | Status: DC | PRN

## 2018-08-24 MED ORDER — LORATADINE 10 MG PO TABS
10.0000 mg | ORAL_TABLET | Freq: Every day | ORAL | Status: DC
  Administered 2018-08-24 – 2018-08-29 (×6): 10 mg via ORAL
  Filled 2018-08-24 (×6): qty 1

## 2018-08-24 MED ORDER — ONDANSETRON HCL 4 MG PO TABS: 4.0000 mg | ORAL_TABLET | Freq: Four times a day (QID) | ORAL | Status: DC | PRN

## 2018-08-24 MED ORDER — ALBUTEROL SULFATE (2.5 MG/3ML) 0.083% IN NEBU: 2.5000 mg | INHALATION_SOLUTION | Freq: Four times a day (QID) | RESPIRATORY_TRACT | Status: DC

## 2018-08-24 MED ORDER — DIGOXIN 125 MCG PO TABS
0.1250 mg | ORAL_TABLET | Freq: Every day | ORAL | Status: DC
  Administered 2018-08-25 – 2018-08-30 (×6): 0.125 mg via ORAL
  Filled 2018-08-24 (×6): qty 1

## 2018-08-24 MED ORDER — IPRATROPIUM-ALBUTEROL 0.5-2.5 (3) MG/3ML IN SOLN
3.0000 mL | Freq: Four times a day (QID) | RESPIRATORY_TRACT | Status: DC
  Filled 2018-08-24: qty 3

## 2018-08-24 MED ORDER — IOPAMIDOL (ISOVUE-370) INJECTION 76%
INTRAVENOUS | Status: AC
  Filled 2018-08-24: qty 100

## 2018-08-24 NOTE — Progress Notes (Signed)
Pamela Mcguire is a 83 y.o. female with the following history as recorded in EpicCare:  Patient Active Problem List   Diagnosis Date Noted  . COPD exacerbation ([place]) 08/26/2017  . Hyperventilation 08/24/2017  . Respiratory distress 08/24/2017  . Pleural effusion 08/22/2017  . Medicare annual wellness visit, subsequent 07/10/2017  . Encounter for general adult medical examination with abnormal findings 07/10/2017  . Insomnia due to medical condition 04/18/2017  . Blurred vision 04/18/2017  . Cancer of ascending colon s/p lap colectomy 02/08/2017 02/08/2017  . Iron deficiency anemia   . Fundic gland polyps of stomach, benign   . Fracture of L1 vertebra ([place]) 10/19/2015  . Weakness   . Compression fracture 09/13/2015  . Mobility impaired   . Fatigue 08/18/2015  . Depression 08/18/2015  . Frequent stools 08/18/2015  . Tachypnea 06/22/2015  . Adenocarcinoma of upper lobe of right lung 04/30/2015  . Mediastinal adenopathy 04/24/2015  . Severe obstructive sleep apnea 03/27/2015  . Lung mass 03/27/2015  . Folate deficiency 09/21/2014  . Paroxysmal a-fib ([place]) 05/27/2014  . Essential hypertension 05/27/2014  . Breast cancer ([place]) 04/17/2014  . Prediabetes 04/17/2014  . CAD (coronary artery disease) 04/16/2014  . Hyperlipidemia 04/16/2014    No current facility-administered medications for this visit.    Current Outpatient Medications  Medication Sig Dispense Refill  . albuterol (PROVENTIL HFA;VENTOLIN HFA) 108 (90 Base) MCG/ACT inhaler Inhale 1 puff into the lungs every 6 (six) hours as needed for wheezing or shortness of breath. Reported on 06/01/2016 18 g 1  . atorvastatin (LIPITOR) 40 MG tablet TAKE 1 TABLET(40 MG) BY MOUTH DAILY 90 tablet 1  . DIGOX 125 MCG tablet TAKE 1 TABLET(0.125 MG) BY MOUTH DAILY (Patient not taking: Reported on 08/24/2018) 30 tablet 5  . DIGOX 125 MCG tablet TAKE 1 TABLET(0.125 MG) BY MOUTH DAILY (Patient taking differently: Take 0.125 mg by mouth daily. ) 30  tablet 3  . diltiazem (CARDIZEM CD) 240 MG 24 hr capsule Take 1 capsule (240 mg total) by mouth daily. 90 capsule 3  . ELIQUIS 5 MG TABS tablet TAKE 1 TABLET(5 MG) BY MOUTH TWICE DAILY (Patient taking differently: Take 5 mg by mouth 2 (two) times daily. ) 180 tablet 1  . escitalopram (LEXAPRO) 10 MG tablet TAKE 1 TABLET(10 MG) BY MOUTH DAILY. FOLLOW-UP APPOINTMENT IS DUE (Patient taking differently: Take 10 mg by mouth daily. ) 30 tablet 4  . ferrous sulfate 325 (65 FE) MG tablet Take 325 mg by mouth every evening.     . furosemide (LASIX) 20 MG tablet Take 1 tablet (20 mg total) by mouth daily. 90 tablet 3  . ipratropium-albuterol (DUONEB) 0.5-2.5 (3) MG/3ML SOLN Take 3 mLs by nebulization 4 (four) times daily as needed. 360 mL 11  . loratadine (CLARITIN) 10 MG tablet Take 1 tablet (10 mg total) by mouth at bedtime. 90 tablet 3  . LORazepam (ATIVAN) 2 MG tablet TAKE 1 TABLET BY MOUTH AT BEDTIME AS NEEDED FOR ANXIETY (Patient not taking: Reported on 08/24/2018) 30 tablet 1  . mirtazapine (REMERON) 15 MG tablet TAKE 1 TABLET(15 MG) BY MOUTH AT BEDTIME 90 tablet 1  . Multiple Vitamins-Minerals (MULTIVITAMIN WITH MINERALS) tablet Take 1 tablet by mouth daily. gummie    . NUTRITIONAL SUPPLEMENT LIQD Take 120 mLs by mouth 3 (three) times daily. MedPass    . oxyCODONE (OXY IR/ROXICODONE) 5 MG immediate release tablet Take 0.5-1 tablets (2.5-5 mg total) by mouth every 6 (six) hours as needed for severe pain. (Patient  not taking: Reported on 08/24/2018) 30 tablet 0  . OXYGEN Inhale 2 L into the lungs as needed.    . pantoprazole (PROTONIX) 40 MG tablet TAKE 1 TABLET(40 MG) BY MOUTH DAILY (Patient taking differently: Take 40 mg by mouth daily. ) 90 tablet 0  . tolterodine (DETROL LA) 4 MG 24 hr capsule Take 1 capsule (4 mg total) by mouth daily. 90 capsule 1  . Ibuprofen-diphenhydrAMINE Cit (ADVIL PM PO) Take 1 tablet by mouth at bedtime.     Facility-Administered Medications Ordered in Other Visits   Medication Dose Route Frequency Provider Last Rate Last Dose  . iopamidol (ISOVUE-370) 76 % injection             Allergies: Patient has no known allergies.  Past Medical History:  Diagnosis Date  . AF (atrial fibrillation) ([place])   . Anxiety   . Arthritis    "thighs, hips, arms" (04/28/2015)  . Breast cancer ([place])    right mastectomy  . CAD (coronary artery disease)   . Dehydration 12/06/2016  . Dementia   . Depression   . Diverticulitis   . GERD (gastroesophageal reflux disease)   . HCAP (healthcare-associated pneumonia) 09/15/2015  . Hyperlipidemia   . Hypertension   . Non-small cell carcinoma of lung, stage 3 ([place]) 04/30/2015  . Prediabetes   . Seasonal allergies   . Shortness of breath dyspnea   . Sleep apnea     dx'd 10/2014,refused cpap machine  . Urinary frequency   . UTI (urinary tract infection) 06/06/2015    Past Surgical History:  Procedure Laterality Date  . BREAST SURGERY    . CARDIOVERSION    . Cataract surgery    . CESAREAN SECTION  X 2  . COLON SURGERY    . COLONOSCOPY    . COLONOSCOPY N/A 01/13/2017   Procedure: COLONOSCOPY;  Surgeon: Gatha Mayer, MD;  Location[facility];  Service: Endoscopy;  Laterality: N/A;  . ESOPHAGOGASTRODUODENOSCOPY N/A 01/13/2017   Procedure: ESOPHAGOGASTRODUODENOSCOPY (EGD);  Surgeon: Gatha Mayer, MD;  Location: [facility];  Service: Endoscopy;  Laterality: N/A;  . LAPAROSCOPIC PARTIAL COLECTOMY N/A 02/08/2017   Procedure: LAPAROSCOPIC PARTIAL COLECTOMY;  Surgeon: Leighton Ruff, MD;  Location: [facility];  Service: General;  Laterality: N/A;  . MASTECTOMY Right   . MEDIASTINOSCOPY N/A 04/28/2015   Procedure: MEDIASTINOSCOPY;  Surgeon: [name], MD;  Location: [facility] OR;  Service: Thoracic;  Laterality: N/A;  . TONSILLECTOMY      Family History  Problem Relation Age of Onset  . Mental illness Mother     Social History   Tobacco Use  . Smoking status: Former Smoker    Packs/day: 1.00    Years: 35.00    Pack years:  35.00    Types: Cigarettes    Last attempt to quit: 11/28/1992    Years since quitting: 25.7  . Smokeless tobacco: Never Used  . Tobacco comment: "stopped smoking at age 74"  Substance Use Topics  . Alcohol use: Yes    Alcohol/week: 9.0 standard drinks    Types: 9 Glasses of wine per week    Comment: 04/28/2015 "drink 3, 6oz,  glasses of wine on Sat & Sun"    Subjective:  Patient is brought to the office by her daughter; has home health nurses who became concerned about how low patient's 02 sats were today; per daughter, mother has oxygen but is not wearing; complaining of feeling short of breath, dizzy; daughter notes that mother has not been  eating/ drinking well the past few days- concerned for dehydration. Has history of Paroxysmal A. Fib;    Objective:  Vitals:   08/24/18 1758  BP: 140/78  Pulse: 84  Temp: 98.1 F (36.7 C)  SpO2: (!) 80%    General: Well developed, well nourished, in mild distress  Skin : Warm and dry.  Head: Normocephalic and atraumatic  Eyes: Sclera and conjunctiva clear; pupils round and reactive to light; extraocular movements intact  Ears: External normal; canals clear; tympanic membranes normal  Oropharynx: Pink, supple. No suspicious lesions  Neck: Supple without thyromegaly, adenopathy  Lungs: Respirations labored- improvement once she is started on 3L 02;clear to auscultation bilaterally without wheeze, rales, rhonchi  CVS exam:  irregularly irregular rhythm.  Neurologic: Alert and oriented; speech intact; face symmetrical; moves all extremities well; CNII-XII intact without focal deficit   Assessment:  1. Shortness of breath   2. Hypoxia   3. Paroxysmal A-fib ([place])     Plan:  Patient's O2 sats improve to 90 in office while on oxygen; suspect combination of A. Fib, dehydration; daughter agrees to take directly to [facility]; nurse is notified; follow-up as needed.   No follow-ups on file.  No orders of the defined types were placed in this  encounter.   Requested Prescriptions    No prescriptions requested or ordered in this encounter

## 2018-08-24 NOTE — ED Notes (Signed)
ED Provider at bedside. 

## 2018-08-24 NOTE — ED Notes (Signed)
ED TO INPATIENT HANDOFF REPORT  Name/Age/Gender Pamela Mcguire 83 y.o. female  Code Status Code Status History    Date Active Date Inactive Code Status Order ID Comments User Context   08/25/2017 0215 08/27/2017 1953 Full Code [id number]  Jani Gravel, MD ED   08/05/2017 1741 08/09/2017 1848 Full Code [id number]  Benito Mccreedy, MD Inpatient   02/08/2017 1239 02/15/2017 1935 Full Code [id number]  Leighton Ruff, MD Inpatient   09/13/2015 1819 09/17/2015 1747 Full Code [id number]  Kelvin Cellar, MD Inpatient   06/22/2015 0240 06/26/2015 1925 Full Code [id number]  Theressa Millard, MD Inpatient      Home/SNF/Other Home  Chief Complaint Trouble Breathing/Oxygen Levels low/Afib.    Level of Care/Admitting Diagnosis ED Disposition    ED Disposition Condition Comment   Admit  Hospital Area: [facility] [[id number]]  Level of Care: Telemetry [5]  Admit to tele based on following criteria: Other see comments  Comments: hypoxemia  Diagnosis: Pleural effusion [242230]  Admitting Physician: Merton Border [[id number]]  Attending Physician: Laren Everts, [place]  Estimated length of stay: past midnight tomorrow  Certification:: I certify this patient will need inpatient services for at least 2 midnights  PT Class (Do Not Modify): Inpatient [101]  PT Acc Code (Do Not Modify): Private [1]       Medical History Past Medical History:  Diagnosis Date  . AF (atrial fibrillation) ([place])   . Anxiety   . Arthritis    "thighs, hips, arms" (04/28/2015)  . Breast cancer ([place])    right mastectomy  . CAD (coronary artery disease)   . Dehydration 12/06/2016  . Dementia   . Depression   . Diverticulitis   . GERD (gastroesophageal reflux disease)   . HCAP (healthcare-associated pneumonia) 09/15/2015  . Hyperlipidemia   . Hypertension   . Non-small cell carcinoma of lung, stage 3 ([place]) 04/30/2015  . Prediabetes   . Seasonal allergies   . Shortness of breath dyspnea   . Sleep apnea     dx'd  10/2014,refused cpap machine  . Urinary frequency   . UTI (urinary tract infection) 06/06/2015    Allergies No Known Allergies  IV Location/Drains/Wounds Patient Lines/Drains/Airways Status   Active Line/Drains/Airways    Name:   Placement date:   Placement time:   Site:   Days:   Peripheral IV 08/24/18 Left Arm   08/24/18    1702    Arm   less than 1   Incision (Closed) 02/08/17 Abdomen Other (Comment)   02/08/17    1037     562   Incision - 3 Ports Abdomen Upper;Lateral Left;Lateral Lower;Lateral   02/08/17    1030     562          Labs/Imaging Results for orders placed or performed during the hospital encounter of 08/24/18 (from the past 48 hour(s))  Comprehensive metabolic panel     Status: Abnormal   Collection Time: 08/24/18  4:26 PM  Result Value Ref Range   Sodium 144 135 - 145 mmol/L   Potassium 3.6 3.5 - 5.1 mmol/L   Chloride 99 98 - 111 mmol/L   CO2 32 22 - 32 mmol/L   Glucose, Bld 106 (H) 70 - 99 mg/dL   BUN 11 8 - 23 mg/dL   Creatinine, Ser 0.85 0.44 - 1.00 mg/dL   Calcium 9.3 8.9 - 10.3 mg/dL   Total Protein 7.2 6.5 - 8.1 g/dL   Albumin 3.9 3.5 - 5.0 g/dL   AST 20  15 - 41 U/L   ALT 13 0 - 44 U/L   Alkaline Phosphatase 50 38 - 126 U/L   Total Bilirubin 1.2 0.3 - 1.2 mg/dL   GFR calc non Af Amer >60 >60 mL/min   GFR calc Af Amer >60 >60 mL/min    Comment: (NOTE) The eGFR has been calculated using the CKD EPI equation. This calculation has not been validated in all clinical situations. eGFR's persistently <60 mL/min signify possible Chronic Kidney Disease.    Anion gap 13 5 - 15    Comment: Performed at [facility], [place] [address], [place], [place] [address]  CBC WITH DIFFERENTIAL     Status: None   Collection Time: 08/24/18  4:26 PM  Result Value Ref Range   WBC 7.8 4.0 - 10.5 K/uL   RBC 4.30 3.87 - 5.11 MIL/uL   Hemoglobin 13.1 12.0 - 15.0 g/dL   HCT 40.2 36.0 - 46.0 %   MCV 93.5 78.0 - 100.0 fL   MCH 30.5 26.0 - 34.0 pg   MCHC  32.6 30.0 - 36.0 g/dL   RDW 14.2 11.5 - 15.5 %   Platelets 368 150 - 400 K/uL   Neutrophils Relative % 79 %   Neutro Abs 6.2 1.7 - 7.7 K/uL   Lymphocytes Relative 13 %   Lymphs Abs 1.0 0.7 - 4.0 K/uL   Monocytes Relative 7 %   Monocytes Absolute 0.5 0.1 - 1.0 K/uL   Eosinophils Relative 1 %   Eosinophils Absolute 0.1 0.0 - 0.7 K/uL   Basophils Relative 0 %   Basophils Absolute 0.0 0.0 - 0.1 K/uL    Comment: Performed at [facility], [place] [address] [place], [place] [address]  Troponin I     Status: None   Collection Time: 08/24/18  4:26 PM  Result Value Ref Range   Troponin I <0.03 <0.03 ng/mL    Comment: Performed at [facility] [place] [address], [place], [place] [address]  Brain natriuretic peptide     Status: Abnormal   Collection Time: 08/24/18  4:26 PM  Result Value Ref Range   B Natriuretic Peptide 156.0 (H) 0.0 - 100.0 pg/mL    Comment: Performed at [facility], [place] [address] [place], [place] [address]  Urinalysis, Complete w Microscopic     Status: Abnormal   Collection Time: 08/24/18  4:38 PM  Result Value Ref Range   Color, Urine YELLOW YELLOW   APPearance CLEAR CLEAR   Specific Gravity, Urine 1.006 1.005 - 1.030   pH 7.0 5.0 - 8.0   Glucose, UA NEGATIVE NEGATIVE mg/dL   Hgb urine dipstick NEGATIVE NEGATIVE   Bilirubin Urine NEGATIVE NEGATIVE   Ketones, ur NEGATIVE NEGATIVE mg/dL   Protein, ur NEGATIVE NEGATIVE mg/dL   Nitrite NEGATIVE NEGATIVE   Leukocytes, UA LARGE (A) NEGATIVE   RBC / HPF 0-5 0 - 5 RBC/hpf   WBC, UA >50 (H) 0 - 5 WBC/hpf   Bacteria, UA FEW (A) NONE SEEN   Squamous Epithelial / LPF 0-5 0 - 5    Comment: Performed at [facility], [place] [address] [place], [place] [address]   Ct Angio Chest Pe W Or Wo Contrast  Result Date: 08/24/2018 CLINICAL DATA:  Shortness of breath. History of non-small cell lung cancer. EXAM: CT ANGIOGRAPHY CHEST WITH CONTRAST TECHNIQUE:  Multidetector CT imaging of the chest was performed using the standard protocol during bolus administration of intravenous contrast. Multiplanar  CT image reconstructions and MIPs were obtained to evaluate the vascular anatomy. CONTRAST:  111m ISOVUE-370 IOPAMIDOL (ISOVUE-370) INJECTION 76% COMPARISON:  Chest x-ray from today. CTA of the chest January 02, 2018. FINDINGS: Cardiovascular: Cardiomegaly. Coronary artery calcifications. Atherosclerotic change in the nonaneurysmal aorta. No aortic dissection. Evaluation of more peripheral pulmonary arteries is difficult due to respiratory motion. Evaluation of right-sided pulmonary arteries is limited due to significant atelectasis due to the large effusion. Within this limitation, no pulmonary emboli identified. Mediastinum/Nodes: Apparent right mastectomy. Visualize portions of the left breast are normal. No axillary or cervical adenopathy identified. Mild nodularity in the right thyroid is unchanged, requiring no follow-up. A right pretracheal node on series 8, image 100 measures 11 mm today, unchanged. The cardiomediastinal silhouette is shifted to the right due the large right-sided pleural effusion. It is difficult to compare the remainder of the lymph nodes on today's study to the previous but there has been no other definitive change. No left-sided pleural effusion. No definitive pericardial effusion. Lungs/Pleura: The central airways are patent. No pneumothorax. There is a large right-sided pleural effusion which is larger in the interval. The amount of aerated lung has decreased. The previously identified right lower lobe nodules not visualized on this study with this portion of lung is not aerated. Mild nodularity anteriorly in the right lung measuring 7.7 mm on series 9, image 106 is difficult to compare to previous studies due to the large effusion. Post therapeutic changes in the right perihilar region. Probable mild pulmonary edema. No other new nodules or  masses identified in the lungs. Upper Abdomen: No acute abnormality. Musculoskeletal: Compression fracture of an upper lumbar vertebral body, unchanged. No other acute bony changes. Review of the MIP images confirms the above findings. IMPRESSION: 1. No pulmonary emboli identified. 2. There is a large right pleural effusion which is much larger in the interval. The amount of aerated lung on the right has decreased. 3. Cardiomegaly and mild pulmonary edema. 4. The previously identified right-sided nodule is obscured by atelectatic lung today. 5. 7.7 mm nodularity anteriorly in the right lung could represent focal atelectasis or a new nodule. Recommend short-term follow-up to in further evaluate after the effusion has resolved. 6. Post therapeutic changes in the right perihilar region. 7. Atherosclerotic changes in the aorta. Coronary artery calcifications. 8. Stable compression fracture of an upper lumbar vertebral body. Aortic Atherosclerosis (ICD10-I70.0). Electronically Signed   By: DDorise BullionIII M.D   On: 08/24/2018 17:58   Dg Chest Port 1 View  Result Date: 08/24/2018 CLINICAL DATA:  Chest pain and shortness of breath. Breast cancer. Non-small-cell lung cancer. Atrial fibrillation. EXAM: PORTABLE CHEST 1 VIEW COMPARISON:  Chest x-ray.  06/05/2018. CT chest earlier today. FINDINGS: Cardiomegaly. Calcified tortuous aorta. Large RIGHT pleural effusion, uncertain etiology. RIGHT hilar clips. RIGHT mastectomy. Osteopenia. Interstitial prominence of the RIGHT lung, uncertain significance. IMPRESSION: Large RIGHT effusion is the dominant abnormality. Marked worsening aeration from prior chest radiograph. Consider RIGHT-sided thoracentesis as a diagnostic and therapeutic maneuver. Electronically Signed   By: JStaci RighterM.D.   On: 08/24/2018 18:04    Pending Labs Unresulted Labs (From admission, onward)    Start     Ordered   08/24/18 1802  Urine culture  Add-on,   STAT     08/24/18 1801   Signed and  Held  Basic metabolic panel  Tomorrow morning,   R     Signed and Held          Vitals/Pain Today's  Vitals   08/24/18 1800 08/24/18 1824 08/24/18 1830 08/24/18 1900  BP: (!) 152/74 (!) 152/74 (!) 158/76 (!) 145/73  Pulse: 72 80 84 73  Resp: (!) 29 (!) 22 (!) 34 (!) 23  Temp:      TempSrc:      SpO2: 91% 92% 93% 92%  Weight:      Height:      PainSc:        Isolation Precautions No active isolations  Medications Medications  iopamidol (ISOVUE-370) 76 % injection (has no administration in time range)  cephALEXin (KEFLEX) capsule 500 mg (500 mg Oral Not Given 08/24/18 1855)  iopamidol (ISOVUE-370) 76 % injection 100 mL (100 mLs Intravenous Contrast Given 08/24/18 1724)    Mobility walks with device

## 2018-08-24 NOTE — H&P (Addendum)
[facility]                                                                                    Patient Demographics  Pamela Mcguire, is a 83 y.o. female  CSN: [id number]  MRN: [id number]  DOB - December 12, 1934  Admit Date - 08/24/2018  Outpatient Primary MD for the patient is Hoyt Koch, MD   With History of -  Past Medical History:  Diagnosis Date  . AF (atrial fibrillation) ([place])   . Anxiety   . Arthritis    "thighs, hips, arms" (04/28/2015)  . Breast cancer ([place])    right mastectomy  . CAD (coronary artery disease)   . Dehydration 12/06/2016  . Dementia   . Depression   . Diverticulitis   . GERD (gastroesophageal reflux disease)   . HCAP (healthcare-associated pneumonia) 09/15/2015  . Hyperlipidemia   . Hypertension   . Non-small cell carcinoma of lung, stage 3 ([place]) 04/30/2015  . Prediabetes   . Seasonal allergies   . Shortness of breath dyspnea   . Sleep apnea     dx'd 10/2014,refused cpap machine  . Urinary frequency   . UTI (urinary tract infection) 06/06/2015      Past Surgical History:  Procedure Laterality Date  . BREAST SURGERY    . CARDIOVERSION    . Cataract surgery    . CESAREAN SECTION  X 2  . COLON SURGERY    . COLONOSCOPY    . COLONOSCOPY N/A 01/13/2017   Procedure: COLONOSCOPY;  Surgeon: Gatha Mayer, MD;  Location: [facility] ENDOSCOPY;  Service: Endoscopy;  Laterality: N/A;  . ESOPHAGOGASTRODUODENOSCOPY N/A 01/13/2017   Procedure: ESOPHAGOGASTRODUODENOSCOPY (EGD);  Surgeon: Gatha Mayer, MD;  Location: [facility];  Service: Endoscopy;  Laterality: N/A;  . LAPAROSCOPIC PARTIAL COLECTOMY N/A 02/08/2017   Procedure: LAPAROSCOPIC PARTIAL COLECTOMY;  Surgeon: Leighton Ruff, MD;  Location: [facility] ORS;  Service: General;  Laterality: N/A;  . MASTECTOMY Right   . MEDIASTINOSCOPY N/A 04/28/2015   Procedure: MEDIASTINOSCOPY;  Surgeon: [name], MD;  Location: [facility] OR;  Service: Thoracic;  Laterality: N/A;  . TONSILLECTOMY      in for    Chief Complaint  Patient presents with  . Shortness of Breath  . Chest Pain     HPI  Pamela Mcguire  is a 83 y.o. female, with past medical history significant for right lung adenocarcinoma status post radiation and chemotherapy in the past and now on surveillance and no active therapy, history of breast cancer status post right mastectomy and colon cancer in remission presenting today with shortness of breath worsening for the last few days.  Patient denies any fever or chills.  Patient has a history of atrial fibrillation on Eliquis and has a history of hypoxemia on chronic oxygen at home and reports associated chest tightness radiating to the back.  Patient reports sputum which is yellowish in color.  Patient denies any dizziness or loss of consciousness.  Patient denies any abdominal pain, diarrhea or urinary  Symptoms .  In the emergency room the chest x-ray shows large pleural effusion knowing that the patient has a history of pleural effusion status  post thoracentesis on July of this year.  This has been recurrent and the patient is concerned.  Discussions about goals of care were done and the patient asked for the thoracentesis to be done first for therapeutic and diagnosis purposes    Review of Systems    In addition to the HPI above,  No Fever-chills, No Headache, No changes with Vision or hearing, No problems swallowing food or Liquids, No Abdominal pain, No Nausea or Vommitting, Bowel movements are regular, No Blood in stool or Urine, No dysuria, No new skin rashes or bruises, No new joints pains-aches,  No new weakness, tingling, numbness in any extremity, No recent weight gain or loss, No polyuria, polydypsia or polyphagia, No significant Mental Stressors.  A full 10 point Review of Systems was done, except as stated above, all other Review of Systems were negative.   Social History Social History   Tobacco Use  . Smoking status: Former Smoker    Packs/day: 1.00     Years: 35.00    Pack years: 35.00    Types: Cigarettes    Last attempt to quit: 11/28/1992    Years since quitting: 25.7  . Smokeless tobacco: Never Used  . Tobacco comment: "stopped smoking at age 33"  Substance Use Topics  . Alcohol use: Yes    Alcohol/week: 9.0 standard drinks    Types: 9 Glasses of wine per week    Comment: 04/28/2015 "drink 3, 6oz,  glasses of wine on Sat & Sun"     Family History Family History  Problem Relation Age of Onset  . Mental illness Mother      Prior to Admission medications   Medication Sig Start Date End Date Taking? Authorizing Provider  atorvastatin (LIPITOR) 40 MG tablet TAKE 1 TABLET(40 MG) BY MOUTH DAILY 06/06/18  Yes Hoyt Koch, MD  DIGOX 125 MCG tablet TAKE 1 TABLET(0.125 MG) BY MOUTH DAILY Patient taking differently: Take 0.125 mg by mouth daily.  08/06/18  Yes Hilty, Nadean Corwin, MD  diltiazem (CARDIZEM CD) 240 MG 24 hr capsule Take 1 capsule (240 mg total) by mouth daily. 09/12/17  Yes Hilty, Nadean Corwin, MD  ELIQUIS 5 MG TABS tablet TAKE 1 TABLET(5 MG) BY MOUTH TWICE DAILY Patient taking differently: Take 5 mg by mouth 2 (two) times daily.  04/03/18  Yes Hilty, Nadean Corwin, MD  escitalopram (LEXAPRO) 10 MG tablet TAKE 1 TABLET(10 MG) BY MOUTH DAILY. FOLLOW-UP APPOINTMENT IS DUE Patient taking differently: Take 10 mg by mouth daily.  04/11/18  Yes Hoyt Koch, MD  ferrous sulfate 325 (65 FE) MG tablet Take 325 mg by mouth every evening.    Yes [provider]  furosemide (LASIX) 20 MG tablet Take 1 tablet (20 mg total) by mouth daily. 09/12/17  Yes Hilty, Nadean Corwin, MD  Ibuprofen-diphenhydrAMINE Cit (ADVIL PM PO) Take 1 tablet by mouth at bedtime.   Yes [provider]  loratadine (CLARITIN) 10 MG tablet Take 1 tablet (10 mg total) by mouth at bedtime. 09/25/17  Yes Hoyt Koch, MD  mirtazapine (REMERON) 15 MG tablet TAKE 1 TABLET(15 MG) BY MOUTH AT BEDTIME 06/06/18  Yes Hoyt Koch, MD   Multiple Vitamins-Minerals (MULTIVITAMIN WITH MINERALS) tablet Take 1 tablet by mouth daily. gummie   Yes [provider]  pantoprazole (PROTONIX) 40 MG tablet TAKE 1 TABLET(40 MG) BY MOUTH DAILY Patient taking differently: Take 40 mg by mouth daily.  07/10/18  Yes Hoyt Koch, MD  tolterodine (DETROL LA)  4 MG 24 hr capsule Take 1 capsule (4 mg total) by mouth daily. 06/06/18  Yes Hoyt Koch, MD  albuterol (PROVENTIL HFA;VENTOLIN HFA) 108 (90 Base) MCG/ACT inhaler Inhale 1 puff into the lungs every 6 (six) hours as needed for wheezing or shortness of breath. Reported on 06/01/2016 11/15/16   Golden Circle, FNP  DIGOX 125 MCG tablet TAKE 1 TABLET(0.125 MG) BY MOUTH DAILY Patient not taking: Reported on 08/24/2018 02/12/18   Pixie Casino, MD  ipratropium-albuterol (DUONEB) 0.5-2.5 (3) MG/3ML SOLN Take 3 mLs by nebulization 4 (four) times daily as needed. 09/19/17   Hoyt Koch, MD  LORazepam (ATIVAN) 2 MG tablet TAKE 1 TABLET BY MOUTH AT BEDTIME AS NEEDED FOR ANXIETY Patient not taking: Reported on 08/24/2018 08/22/17   Golden Circle, FNP  NUTRITIONAL SUPPLEMENT LIQD Take 120 mLs by mouth 3 (three) times daily. [employer]    [provider]  oxyCODONE (OXY IR/ROXICODONE) 5 MG immediate release tablet Take 0.5-1 tablets (2.5-5 mg total) by mouth every 6 (six) hours as needed for severe pain. Patient not taking: Reported on 08/24/2018 04/18/17   Golden Circle, FNP  OXYGEN Inhale 2 L into the lungs as needed.    [provider]    No Known Allergies  Physical Exam  Vitals  Blood pressure (!) 128/57, pulse (!) 59, temperature (!) 97.5 F (36.4 C), temperature source Oral, resp. rate (!) 32, height 5' 6.5" (1.689 m), weight 74.4 kg, SpO2 93 %.   1. General elderly lady, anxious, extremely pleasant  2. Normal affect and insight, Not Suicidal or Homicidal, Awake Alert, Oriented X 3.  3. No F.N deficits, grossly, patient moving all  extremities.  4. Ears and Eyes appear Normal, Conjunctivae clear, PERRLA. Moist Oral Mucosa.  5. Supple Neck, No JVD, No cervical lymphadenopathy appriciated, No Carotid Bruits.  6. Symmetrical Chest wall movement, decreased breath sounds at the right side.  7.  Irregularly irregular no Gallops, Rubs or Murmurs, No Parasternal Heave.  8. Positive Bowel Sounds, Abdomen Soft, Non tender, No organomegaly appriciated,No rebound -guarding or rigidity.  9.  No Cyanosis, Normal Skin Turgor, No Skin Rash or Bruise.  10. Good muscle tone,  joints appear normal , no effusions, Normal ROM.    Data Review  CBC Recent Labs  Lab 08/24/18 1626  WBC 7.8  HGB 13.1  HCT 40.2  PLT 368  MCV 93.5  MCH 30.5  MCHC 32.6  RDW 14.2  LYMPHSABS 1.0  MONOABS 0.5  EOSABS 0.1  BASOSABS 0.0   ------------------------------------------------------------------------------------------------------------------  Chemistries  Recent Labs  Lab 08/24/18 1626  NA 144  K 3.6  CL 99  CO2 32  GLUCOSE 106*  BUN 11  CREATININE 0.85  CALCIUM 9.3  AST 20  ALT 13  ALKPHOS 50  BILITOT 1.2   ------------------------------------------------------------------------------------------------------------------ estimated creatinine clearance is 54.2 mL/min (by C-G formula based on SCr of 0.85 mg/dL). ------------------------------------------------------------------------------------------------------------------ No results for input(s): TSH, T4TOTAL, T3FREE, THYROIDAB in the last 72 hours.  Invalid input(s): FREET3   Coagulation profile No results for input(s): INR, PROTIME in the last 168 hours. ------------------------------------------------------------------------------------------------------------------- No results for input(s): DDIMER in the last 72 hours. -------------------------------------------------------------------------------------------------------------------  Cardiac Enzymes Recent  Labs  Lab 08/24/18 1626  TROPONINI <0.03   ------------------------------------------------------------------------------------------------------------------ Invalid input(s): POCBNP   ---------------------------------------------------------------------------------------------------------------  Urinalysis    Component Value Date/Time   COLORURINE YELLOW 08/24/2018 [place] 08/24/2018 1638   LABSPEC 1.006 08/24/2018 1638   LABSPEC 1.010 06/02/2015  [place] 7.0 08/24/2018 [place] 08/24/2018 [place] 07/10/2017 1633   GLUCOSEU Negative 06/02/2015 1706   HGBUR NEGATIVE 08/24/2018 1638   BILIRUBINUR NEGATIVE 08/24/2018 1638   BILIRUBINUR 3+      4mg /dl 09/08/2016 1133   KETONESUR NEGATIVE 08/24/2018 1638   PROTEINUR NEGATIVE 08/24/2018 1638   UROBILINOGEN 0.2 07/10/2017 1633   UROBILINOGEN 0.2 06/02/2015 1706   NITRITE NEGATIVE 08/24/2018 1638   LEUKOCYTESUR LARGE (A) 08/24/2018 1638   LEUKOCYTESUR Moderate 06/02/2015 1706    ----------------------------------------------------------------------------------------------------------------   Imaging results:   Ct Angio Chest Pe W Or Wo Contrast  Result Date: 08/24/2018 CLINICAL DATA:  Shortness of breath. History of non-small cell lung cancer. EXAM: CT ANGIOGRAPHY CHEST WITH CONTRAST TECHNIQUE: Multidetector CT imaging of the chest was performed using the standard protocol during bolus administration of intravenous contrast. Multiplanar CT image reconstructions and MIPs were obtained to evaluate the vascular anatomy. CONTRAST:  166mL ISOVUE-370 IOPAMIDOL (ISOVUE-370) INJECTION 76% COMPARISON:  Chest x-ray from today. CTA of the chest January 02, 2018. FINDINGS: Cardiovascular: Cardiomegaly. Coronary artery calcifications. Atherosclerotic change in the nonaneurysmal aorta. No aortic dissection. Evaluation of more peripheral pulmonary arteries is difficult due to respiratory  motion. Evaluation of right-sided pulmonary arteries is limited due to significant atelectasis due to the large effusion. Within this limitation, no pulmonary emboli identified. Mediastinum/Nodes: Apparent right mastectomy. Visualize portions of the left breast are normal. No axillary or cervical adenopathy identified. Mild nodularity in the right thyroid is unchanged, requiring no follow-up. A right pretracheal node on series 8, image 100 measures 11 mm today, unchanged. The cardiomediastinal silhouette is shifted to the right due the large right-sided pleural effusion. It is difficult to compare the remainder of the lymph nodes on today's study to the previous but there has been no other definitive change. No left-sided pleural effusion. No definitive pericardial effusion. Lungs/Pleura: The central airways are patent. No pneumothorax. There is a large right-sided pleural effusion which is larger in the interval. The amount of aerated lung has decreased. The previously identified right lower lobe nodules not visualized on this study with this portion of lung is not aerated. Mild nodularity anteriorly in the right lung measuring 7.7 mm on series 9, image 106 is difficult to compare to previous studies due to the large effusion. Post therapeutic changes in the right perihilar region. Probable mild pulmonary edema. No other new nodules or masses identified in the lungs. Upper Abdomen: No acute abnormality. Musculoskeletal: Compression fracture of an upper lumbar vertebral body, unchanged. No other acute bony changes. Review of the MIP images confirms the above findings. IMPRESSION: 1. No pulmonary emboli identified. 2. There is a large right pleural effusion which is much larger in the interval. The amount of aerated lung on the right has decreased. 3. Cardiomegaly and mild pulmonary edema. 4. The previously identified right-sided nodule is obscured by atelectatic lung today. 5. 7.7 mm nodularity anteriorly in the  right lung could represent focal atelectasis or a new nodule. Recommend short-term follow-up to in further evaluate after the effusion has resolved. 6. Post therapeutic changes in the right perihilar region. 7. Atherosclerotic changes in the aorta. Coronary artery calcifications. 8. Stable compression fracture of an upper lumbar vertebral body. Aortic Atherosclerosis (ICD10-I70.0). Electronically Signed   By: Dorise Bullion III M.D   On: 08/24/2018 17:58   Dg Chest Port 1 View  Result Date: 08/24/2018 CLINICAL DATA:  Chest pain and shortness of breath. Breast cancer. Non-small-cell  lung cancer. Atrial fibrillation. EXAM: PORTABLE CHEST 1 VIEW COMPARISON:  Chest x-ray.  06/05/2018. CT chest earlier today. FINDINGS: Cardiomegaly. Calcified tortuous aorta. Large RIGHT pleural effusion, uncertain etiology. RIGHT hilar clips. RIGHT mastectomy. Osteopenia. Interstitial prominence of the RIGHT lung, uncertain significance. IMPRESSION: Large RIGHT effusion is the dominant abnormality. Marked worsening aeration from prior chest radiograph. Consider RIGHT-sided thoracentesis as a diagnostic and therapeutic maneuver. Electronically Signed   By: Staci Righter M.D.   On: 08/24/2018 18:04    My personal review of EKG: A. fib at 88 bpm with no acute changes  Assessment & Plan  1.  Large, recurrent right pleural effusion with history of right lung adeno CA      For therapeutic and diagnosis thoracentesis during this admission      Hold Eliquis and start heparin and hold heparin prior to procedure      Oxygen therapy      Nebulizer treatments due to hypoxemia and possible history of COPD      Source can be due to lung cancer versus breast cancer.  2.  Right lung adeno CA status post chemotherapy and radiation      Consult oncology in a.m.      Awaiting diagnostic thoracentesis  3.  History of anxiety/depression      Continue with Lexapro  4.  Atrial fibrillation, controlled rate on Eliquis, Cardizem and  digoxin.      Will hold Eliquis due to thoracentesis  5.  Urinary tract infection     Continue with Rocephin  6.  History of colon cancer status post colon resection  7.  History of breast cancer status post right mastectomy  Goals of care discussed with patient at length.   DVT Prophylaxis Heparin  AM Labs Ordered, also please review Full Orders  Family Communication: Admission, patients condition and plan of care including tests being ordered have been discussed with the patient and daughter who indicate understanding and agree with the plan and Code Status.  Code Status limited  Disposition Plan: Undetermined  Time spent in minutes : 43 minutes  Condition guarded   @SIGNATURE @

## 2018-08-24 NOTE — ED Triage Notes (Signed)
Patient went to her PCP office today for c/o chest pain and SOB x 2 days.Patient's daughter states O2 level has been low. Patient has only been wearing O aat night. Room air sats-73%. Patient states she normally wears O2 2L/min via Hand. Sats on 2L-83%. O2 increased to 4L/min.  Patient is from [facility].

## 2018-08-24 NOTE — ED Notes (Signed)
Pt transported to CT ?

## 2018-08-24 NOTE — ED Provider Notes (Signed)
[facility] DEPT Provider Note   CSN: [id number] Arrival date & time: 08/24/18  1544     History   Chief Complaint Chief Complaint  Patient presents with  . Shortness of Breath  . Chest Pain    HPI Pamela Mcguire is a 83 y.o. female.  HPI  Patient is an 83 year old female with a history of paroxysmal atrial fibrillation on Eliquis, adenocarcinoma of the right lung, currently on surveillance and no active therapy, history of breast cancer status post right mastectomy, and colon cancer in remission, presenting for chest pain or shortness of breath.  Patient is on 2 L of home oxygen, mostly at night but intermittently during the day, and was found to have an oxygen saturation of 73% by the nurse at her nursing care facility.  Patient describes her chest pain as "tight" in a bandlike pattern across her chest, worse on the right, and radiating to the back.  Patient reports that she has felt this for the past 2 days.  Patient reports increasing shortness of breath with exertion.  Patient also reports that she has coughed up yellow mucus with some scant hemoptysis for the past week.  Patient denies fever chills.  Patient denies any history of CAD or stress testing.  Patient denies any history of DVT/PE, estrogen use, recent immobilization, hospitalization, recent surgery.  Patient reports remote smoking history.  Patient does not know if she stays in atrial fibrillation most of the time or not.  Past Medical History:  Diagnosis Date  . AF (atrial fibrillation) ([place])   . Anxiety   . Arthritis    "thighs, hips, arms" (04/28/2015)  . Breast cancer ([place])    right mastectomy  . CAD (coronary artery disease)   . Dehydration 12/06/2016  . Dementia   . Depression   . Diverticulitis   . GERD (gastroesophageal reflux disease)   . HCAP (healthcare-associated pneumonia) 09/15/2015  . Hyperlipidemia   . Hypertension   . Non-small cell carcinoma of lung, stage 3 ([place])  04/30/2015  . Prediabetes   . Seasonal allergies   . Shortness of breath dyspnea   . Sleep apnea     dx'd 10/2014,refused cpap machine  . Urinary frequency   . UTI (urinary tract infection) 06/06/2015    Patient Active Problem List   Diagnosis Date Noted  . COPD exacerbation ([place]) 08/26/2017  . Hyperventilation 08/24/2017  . Respiratory distress 08/24/2017  . Pleural effusion 08/22/2017  . Medicare annual wellness visit, subsequent 07/10/2017  . Encounter for general adult medical examination with abnormal findings 07/10/2017  . Insomnia due to medical condition 04/18/2017  . Blurred vision 04/18/2017  . Cancer of ascending colon s/p lap colectomy 02/08/2017 02/08/2017  . Iron deficiency anemia   . Fundic gland polyps of stomach, benign   . Fracture of L1 vertebra ([place]) 10/19/2015  . Weakness   . Compression fracture 09/13/2015  . Mobility impaired   . Fatigue 08/18/2015  . Depression 08/18/2015  . Frequent stools 08/18/2015  . Tachypnea 06/22/2015  . Adenocarcinoma of upper lobe of right lung 04/30/2015  . Mediastinal adenopathy 04/24/2015  . Severe obstructive sleep apnea 03/27/2015  . Lung mass 03/27/2015  . Folate deficiency 09/21/2014  . Paroxysmal a-fib ([place]) 05/27/2014  . Essential hypertension 05/27/2014  . Breast cancer ([place]) 04/17/2014  . Prediabetes 04/17/2014  . CAD (coronary artery disease) 04/16/2014  . Hyperlipidemia 04/16/2014    Past Surgical History:  Procedure Laterality Date  . BREAST SURGERY    .  CARDIOVERSION    . Cataract surgery    . CESAREAN SECTION  X 2  . COLON SURGERY    . COLONOSCOPY    . COLONOSCOPY N/A 01/13/2017   Procedure: COLONOSCOPY;  Surgeon: Gatha Mayer, MD;  Location: [facility] ENDOSCOPY;  Service: Endoscopy;  Laterality: N/A;  . ESOPHAGOGASTRODUODENOSCOPY N/A 01/13/2017   Procedure: ESOPHAGOGASTRODUODENOSCOPY (EGD);  Surgeon: Gatha Mayer, MD;  Location: [facility];  Service: Endoscopy;  Laterality: N/A;  . LAPAROSCOPIC PARTIAL  COLECTOMY N/A 02/08/2017   Procedure: LAPAROSCOPIC PARTIAL COLECTOMY;  Surgeon: Leighton Ruff, MD;  Location: [facility] ORS;  Service: General;  Laterality: N/A;  . MASTECTOMY Right   . MEDIASTINOSCOPY N/A 04/28/2015   Procedure: MEDIASTINOSCOPY;  Surgeon: [name], MD;  Location: [facility] OR;  Service: Thoracic;  Laterality: N/A;  . TONSILLECTOMY       OB History   None      Home Medications    Prior to Admission medications   Medication Sig Start Date End Date Taking? Authorizing Provider  atorvastatin (LIPITOR) 40 MG tablet TAKE 1 TABLET(40 MG) BY MOUTH DAILY 06/06/18  Yes Hoyt Koch, MD  DIGOX 125 MCG tablet TAKE 1 TABLET(0.125 MG) BY MOUTH DAILY Patient taking differently: Take 0.125 mg by mouth daily.  08/06/18  Yes Hilty, Nadean Corwin, MD  diltiazem (CARDIZEM CD) 240 MG 24 hr capsule Take 1 capsule (240 mg total) by mouth daily. 09/12/17  Yes Hilty, Nadean Corwin, MD  ELIQUIS 5 MG TABS tablet TAKE 1 TABLET(5 MG) BY MOUTH TWICE DAILY Patient taking differently: Take 5 mg by mouth 2 (two) times daily.  04/03/18  Yes Hilty, Nadean Corwin, MD  escitalopram (LEXAPRO) 10 MG tablet TAKE 1 TABLET(10 MG) BY MOUTH DAILY. FOLLOW-UP APPOINTMENT IS DUE Patient taking differently: Take 10 mg by mouth daily.  04/11/18  Yes Hoyt Koch, MD  ferrous sulfate 325 (65 FE) MG tablet Take 325 mg by mouth every evening.    Yes [provider]  furosemide (LASIX) 20 MG tablet Take 1 tablet (20 mg total) by mouth daily. 09/12/17  Yes Hilty, Nadean Corwin, MD  Ibuprofen-diphenhydrAMINE Cit (ADVIL PM PO) Take 1 tablet by mouth at bedtime.   Yes [provider]  loratadine (CLARITIN) 10 MG tablet Take 1 tablet (10 mg total) by mouth at bedtime. 09/25/17  Yes Hoyt Koch, MD  mirtazapine (REMERON) 15 MG tablet TAKE 1 TABLET(15 MG) BY MOUTH AT BEDTIME 06/06/18  Yes Hoyt Koch, MD  Multiple Vitamins-Minerals (MULTIVITAMIN WITH MINERALS) tablet Take 1 tablet by mouth daily. gummie    Yes [provider]  pantoprazole (PROTONIX) 40 MG tablet TAKE 1 TABLET(40 MG) BY MOUTH DAILY Patient taking differently: Take 40 mg by mouth daily.  07/10/18  Yes Hoyt Koch, MD  tolterodine (DETROL LA) 4 MG 24 hr capsule Take 1 capsule (4 mg total) by mouth daily. 06/06/18  Yes Hoyt Koch, MD  albuterol (PROVENTIL HFA;VENTOLIN HFA) 108 (90 Base) MCG/ACT inhaler Inhale 1 puff into the lungs every 6 (six) hours as needed for wheezing or shortness of breath. Reported on 06/01/2016 11/15/16   Golden Circle, FNP  DIGOX 125 MCG tablet TAKE 1 TABLET(0.125 MG) BY MOUTH DAILY Patient not taking: Reported on 08/24/2018 02/12/18   Pixie Casino, MD  ipratropium-albuterol (DUONEB) 0.5-2.5 (3) MG/3ML SOLN Take 3 mLs by nebulization 4 (four) times daily as needed. 09/19/17   Hoyt Koch, MD  LORazepam (ATIVAN) 2 MG tablet TAKE 1 TABLET BY MOUTH  AT BEDTIME AS NEEDED FOR ANXIETY Patient not taking: Reported on 08/24/2018 08/22/17   Golden Circle, FNP  NUTRITIONAL SUPPLEMENT LIQD Take 120 mLs by mouth 3 (three) times daily. [employer]    [provider]  oxyCODONE (OXY IR/ROXICODONE) 5 MG immediate release tablet Take 0.5-1 tablets (2.5-5 mg total) by mouth every 6 (six) hours as needed for severe pain. Patient not taking: Reported on 08/24/2018 04/18/17   Golden Circle, FNP  OXYGEN Inhale 2 L into the lungs as needed.    [provider]    Family History Family History  Problem Relation Age of Onset  . Mental illness Mother     Social History Social History   Tobacco Use  . Smoking status: Former Smoker    Packs/day: 1.00    Years: 35.00    Pack years: 35.00    Types: Cigarettes    Last attempt to quit: 11/28/1992    Years since quitting: 25.7  . Smokeless tobacco: Never Used  . Tobacco comment: "stopped smoking at age 50"  Substance Use Topics  . Alcohol use: Yes    Alcohol/week: 9.0 standard drinks    Types: 9 Glasses of wine per  week    Comment: 04/28/2015 "drink 3, 6oz,  glasses of wine on Sat & Sun"  . Drug use: No     Allergies   Patient has no known allergies.   Review of Systems Review of Systems  Constitutional: Negative for chills and fever.  HENT: Negative for congestion and sore throat.   Eyes: Negative for visual disturbance.  Respiratory: Positive for cough and shortness of breath. Negative for chest tightness.   Cardiovascular: Positive for chest pain. Negative for palpitations and leg swelling.  Gastrointestinal: Negative for abdominal pain, nausea and vomiting.  Genitourinary: Negative for dysuria and flank pain.  Musculoskeletal: Negative for back pain and myalgias.  Skin: Negative for rash.  Neurological: Negative for dizziness, syncope, light-headedness and headaches.     Physical Exam Updated Vital Signs BP (!) 152/78   Pulse 81   Temp 98.7 F (37.1 C) (Oral)   Resp (!) 28   Ht 5' 6.5" (1.689 m)   Wt 74.4 kg   LMP  (LMP Unknown)   SpO2 (!) 89%   BMI 26.07 kg/m   Physical Exam  Constitutional: She appears well-developed and well-nourished. No distress.  HENT:  Head: Normocephalic and atraumatic.  Mouth/Throat: Oropharynx is clear and moist.  Eyes: Pupils are equal, round, and reactive to light. Conjunctivae and EOM are normal.  Neck: Normal range of motion. Neck supple.  Cardiovascular: Normal rate, S1 normal and S2 normal.  No murmur heard. Irregularly irregular rhythm.  Pulmonary/Chest: Effort normal. No accessory muscle usage. Tachypnea noted. She has decreased breath sounds in the right middle field and the right lower field. She has no wheezes. She has no rales.  Able to speak in full sentences.  Abdominal: Soft. She exhibits no distension. There is no tenderness. There is no guarding.  Musculoskeletal: Normal range of motion. She exhibits no edema or deformity.  Lymphadenopathy:    She has no cervical adenopathy.  Neurological: She is alert.  Cranial nerves  grossly intact. Patient moves extremities symmetrically and with good coordination.  Skin: Skin is warm and dry. No rash noted. No erythema.  Psychiatric: She has a normal mood and affect. Her behavior is normal. Judgment and thought content normal.  Nursing note and vitals reviewed.    ED Treatments / Results  Labs (  all labs ordered are listed, but only abnormal results are displayed) Labs Reviewed  COMPREHENSIVE METABOLIC PANEL - Abnormal; Notable for the following components:      Result Value   Glucose, Bld 106 (*)    All other components within normal limits  URINALYSIS, COMPLETE (UACMP) WITH MICROSCOPIC - Abnormal; Notable for the following components:   Leukocytes, UA LARGE (*)    WBC, UA >50 (*)    Bacteria, UA FEW (*)    All other components within normal limits  BRAIN NATRIURETIC PEPTIDE - Abnormal; Notable for the following components:   B Natriuretic Peptide 156.0 (*)    All other components within normal limits  URINE CULTURE  CBC WITH DIFFERENTIAL/PLATELET  TROPONIN I    EKG EKG Interpretation  Date/Time:  Friday August 24 2018 15:54:12 EDT Ventricular Rate:  88 PR Interval:    QRS Duration: 78 QT Interval:  347 QTC Calculation: 420 R Axis:   84 Text Interpretation:  Atrial fibrillation Borderline right axis deviation Borderline repolarization abnormality When comapred to prior, similar Afib.  No STEMI Confirmed by Antony Blackbird [phone]) on 08/24/2018 5:24:08 PM   Radiology Ct Angio Chest Pe W Or Wo Contrast  Result Date: 08/24/2018 CLINICAL DATA:  Shortness of breath. History of non-small cell lung cancer. EXAM: CT ANGIOGRAPHY CHEST WITH CONTRAST TECHNIQUE: Multidetector CT imaging of the chest was performed using the standard protocol during bolus administration of intravenous contrast. Multiplanar CT image reconstructions and MIPs were obtained to evaluate the vascular anatomy. CONTRAST:  149mL ISOVUE-370 IOPAMIDOL (ISOVUE-370) INJECTION 76% COMPARISON:   Chest x-ray from today. CTA of the chest January 02, 2018. FINDINGS: Cardiovascular: Cardiomegaly. Coronary artery calcifications. Atherosclerotic change in the nonaneurysmal aorta. No aortic dissection. Evaluation of more peripheral pulmonary arteries is difficult due to respiratory motion. Evaluation of right-sided pulmonary arteries is limited due to significant atelectasis due to the large effusion. Within this limitation, no pulmonary emboli identified. Mediastinum/Nodes: Apparent right mastectomy. Visualize portions of the left breast are normal. No axillary or cervical adenopathy identified. Mild nodularity in the right thyroid is unchanged, requiring no follow-up. A right pretracheal node on series 8, image 100 measures 11 mm today, unchanged. The cardiomediastinal silhouette is shifted to the right due the large right-sided pleural effusion. It is difficult to compare the remainder of the lymph nodes on today's study to the previous but there has been no other definitive change. No left-sided pleural effusion. No definitive pericardial effusion. Lungs/Pleura: The central airways are patent. No pneumothorax. There is a large right-sided pleural effusion which is larger in the interval. The amount of aerated lung has decreased. The previously identified right lower lobe nodules not visualized on this study with this portion of lung is not aerated. Mild nodularity anteriorly in the right lung measuring 7.7 mm on series 9, image 106 is difficult to compare to previous studies due to the large effusion. Post therapeutic changes in the right perihilar region. Probable mild pulmonary edema. No other new nodules or masses identified in the lungs. Upper Abdomen: No acute abnormality. Musculoskeletal: Compression fracture of an upper lumbar vertebral body, unchanged. No other acute bony changes. Review of the MIP images confirms the above findings. IMPRESSION: 1. No pulmonary emboli identified. 2. There is a large  right pleural effusion which is much larger in the interval. The amount of aerated lung on the right has decreased. 3. Cardiomegaly and mild pulmonary edema. 4. The previously identified right-sided nodule is obscured by atelectatic lung today. 5. 7.7 mm  nodularity anteriorly in the right lung could represent focal atelectasis or a new nodule. Recommend short-term follow-up to in further evaluate after the effusion has resolved. 6. Post therapeutic changes in the right perihilar region. 7. Atherosclerotic changes in the aorta. Coronary artery calcifications. 8. Stable compression fracture of an upper lumbar vertebral body. Aortic Atherosclerosis (ICD10-I70.0). Electronically Signed   By: Dorise Bullion III M.D   On: 08/24/2018 17:58   Dg Chest Port 1 View  Result Date: 08/24/2018 CLINICAL DATA:  Chest pain and shortness of breath. Breast cancer. Non-small-cell lung cancer. Atrial fibrillation. EXAM: PORTABLE CHEST 1 VIEW COMPARISON:  Chest x-ray.  06/05/2018. CT chest earlier today. FINDINGS: Cardiomegaly. Calcified tortuous aorta. Large RIGHT pleural effusion, uncertain etiology. RIGHT hilar clips. RIGHT mastectomy. Osteopenia. Interstitial prominence of the RIGHT lung, uncertain significance. IMPRESSION: Large RIGHT effusion is the dominant abnormality. Marked worsening aeration from prior chest radiograph. Consider RIGHT-sided thoracentesis as a diagnostic and therapeutic maneuver. Electronically Signed   By: Staci Righter M.D.   On: 08/24/2018 18:04    Procedures Procedures (including critical care time)  Medications Ordered in ED Medications  iopamidol (ISOVUE-370) 76 % injection (has no administration in time range)  iopamidol (ISOVUE-370) 76 % injection 100 mL (100 mLs Intravenous Contrast Given 08/24/18 1724)     Initial Impression / Assessment and Plan / ED Course  I have reviewed the triage vital signs and the nursing notes.  Pertinent labs & imaging results that were available during  my care of the patient were reviewed by me and considered in my medical decision making (see chart for details).  Clinical Course as of Aug 25 1807  Fri Aug 24, 2018  1639 Assessed patient immediately in room and with 2L O2, saturation now 96-97%.  SpO2(!): 73 % [AM]    Clinical Course User Index [AM] Albesa Seen, PA-C   Patient nontoxic-appearing, with some respiratory discomfort.  Patient is not in respiratory distress, and oxygen saturation is 96-97% on 2 L of oxygen.  Differential diagnosis includes recurrent or enlarging pleural effusion, pneumonia, pulmonary embolism, ACS, pneumothorax.  Work-up is significant for negative troponin.  Patient with EKG demonstrating atrial fibrillation.  Patient CTPA shows no evidence of pulmonary embolism, but does have significant enlargement of right pleural effusion since last CT scan in June 2019.  Patient also has new nodular irregularity possibly atelectasis versus new nodule in the right lung.  Incidentally, patient also found to have urinary tract infection.  Culture ordered.  Will initiate Keflex.   6:43 PM had extensive conversation with patient and her daughter regarding [place].  Patient has had multiple alterations in her wishes for the past several months that she has faced medical complications.  Patient and her daughter are in agreement at present, that her current wishes include respiratory support up to AND including intubation if necessary.  Patient expresses that she does not wish to have chest compressions in the event of a cardiac arrest.  Patient does report that she would like medical treatment of her symptoms including thoracentesis if necessary for her symptoms.   Patient admitted per Dr. Laren Everts of [facility].  I appreciate his involvement in the care of this patient.  We discussed over the phone patient's wishes regarding her CODE STATUS.   This is a shared visit with Dr. Gerald Stabs Tegeler. Patient was independently  evaluated by this attending physician. Attending physician consulted in evaluation and disposition management.  Final Clinical Impressions(s) / ED Diagnoses   Final diagnoses:  Chest pain, unspecified type  Shortness of breath    ED Discharge Orders    None       Tamala Julian 08/24/18 1926    Tegeler, Gwenyth Allegra, MD 08/25/18 1026

## 2018-08-24 NOTE — ED Notes (Signed)
Pt O2 desat from mid 90s on 2 L via n/c to 87% when she transferred from bed to [facility] and back to bed.

## 2018-08-24 NOTE — ED Notes (Signed)
[place] and 1 set of cultures drawn and sent to lab.

## 2018-08-24 NOTE — Progress Notes (Signed)
[place] for heparin Indication: atrial fibrillation (home Eliquis on hold)  No Known Allergies  Patient Measurements: Height: 5' 6.5" (168.9 cm) Weight: 164 lb (74.4 kg) IBW/kg (Calculated) : 60.45 Heparin Dosing Weight: 74 kg  Vital Signs: Temp: 98.1 F (36.7 C) (09/27 1758) Temp Source: Oral (09/27 1555) BP: 145/73 (09/27 1900) Pulse Rate: 73 (09/27 1900)  Labs: Recent Labs    08/24/18 1626  HGB 13.1  HCT 40.2  PLT 368  CREATININE 0.85  TROPONINI <0.03    Estimated Creatinine Clearance: 54.2 mL/min (by C-G formula based on SCr of 0.85 mg/dL).   Medical History: Past Medical History:  Diagnosis Date  . AF (atrial fibrillation) ([place])   . Anxiety   . Arthritis    "thighs, hips, arms" (04/28/2015)  . Breast cancer ([place])    right mastectomy  . CAD (coronary artery disease)   . Dehydration 12/06/2016  . Dementia   . Depression   . Diverticulitis   . GERD (gastroesophageal reflux disease)   . HCAP (healthcare-associated pneumonia) 09/15/2015  . Hyperlipidemia   . Hypertension   . Non-small cell carcinoma of lung, stage 3 ([place]) 04/30/2015  . Prediabetes   . Seasonal allergies   . Shortness of breath dyspnea   . Sleep apnea     dx'd 10/2014,refused cpap machine  . Urinary frequency   . UTI (urinary tract infection) 06/06/2015    Medications:  - on Eliquis 5 mg bid PTA (last dose taken on 9/27 at 1045)  Assessment: Patient's an 83 y.o F with hx afib on Eliquis PTA, presented to the ED (from PCP's office) with c/o CP and SOB.  Eliquis placed on hold -- To start heparin for Afib.  Goal of Therapy:  Heparin level 0.3-0.7 units/ml Monitor platelets by anticoagulation protocol: Yes   Plan:  - start heparin drip at 1100 unit/hr at 11p tonight (no bolus) - check heparin level and aPTT at 10p (prior to starting heparin drip) - check heparin level and aPTT 8 hrs after starting heparin drip - monitor for s/s bleeding  Hildred Mollica,  Merland Holness P 08/24/2018,7:19 PM

## 2018-08-25 ENCOUNTER — Inpatient Hospital Stay (HOSPITAL_COMMUNITY): Payer: Medicare HMO

## 2018-08-25 DIAGNOSIS — Z9889 Other specified postprocedural states: Secondary | ICD-10-CM

## 2018-08-25 DIAGNOSIS — R0902 Hypoxemia: Secondary | ICD-10-CM

## 2018-08-25 LAB — BASIC METABOLIC PANEL
Anion gap: 9 (ref 5–15)
BUN: 10 mg/dL (ref 8–23)
CALCIUM: 9.1 mg/dL (ref 8.9–10.3)
CHLORIDE: 101 mmol/L (ref 98–111)
CO2: 33 mmol/L — AB (ref 22–32)
Creatinine, Ser: 0.87 mg/dL (ref 0.44–1.00)
GFR calc non Af Amer: >60 mL/min (ref >60)
Glucose, Bld: 114 mg/dL — ABNORMAL HIGH (ref 70–99)
Potassium: 3.6 mmol/L (ref 3.5–5.1)
SODIUM: 143 mmol/L (ref 135–145)

## 2018-08-25 LAB — GLUCOSE, PLEURAL OR PERITONEAL FLUID: Glucose, Fluid: 111 mg/dL

## 2018-08-25 LAB — PROTEIN, PLEURAL OR PERITONEAL FLUID: TOTAL PROTEIN, FLUID: 3.3 g/dL

## 2018-08-25 LAB — CBC
HCT: 43.1 % (ref 36.0–46.0)
Hemoglobin: 13.7 g/dL (ref 12.0–15.0)
MCH: 30 pg (ref 26.0–34.0)
MCHC: 31.8 g/dL (ref 30.0–36.0)
MCV: 94.5 fL (ref 78.0–100.0)
Platelets: 357 10*3/uL (ref 150–400)
RBC: 4.56 MIL/uL (ref 3.87–5.11)
RDW: 14.3 % (ref 11.5–15.5)
WBC: 8.3 10*3/uL (ref 4.0–10.5)

## 2018-08-25 LAB — BODY FLUID CELL COUNT WITH DIFFERENTIAL
Lymphs, Fluid: 81 %
MONOCYTE-MACROPHAGE-SEROUS FLUID: 8 % — AB (ref 50–90)
Neutrophil Count, Fluid: 11 % (ref 0–25)
Total Nucleated Cell Count, Fluid: 707 cu mm (ref 0–1000)

## 2018-08-25 LAB — LACTATE DEHYDROGENASE: LDH: 177 U/L (ref 98–192)

## 2018-08-25 LAB — APTT
APTT: 152 s — AB (ref 24–36)
aPTT: 35 seconds (ref 24–36)

## 2018-08-25 LAB — LACTATE DEHYDROGENASE, PLEURAL OR PERITONEAL FLUID: LD, Fluid: 100 U/L — ABNORMAL HIGH (ref 3–23)

## 2018-08-25 LAB — HEPARIN LEVEL (UNFRACTIONATED): HEPARIN UNFRACTIONATED: 2.02 [IU]/mL — AB (ref 0.30–0.70)

## 2018-08-25 MED ORDER — APIXABAN 5 MG PO TABS
5.0000 mg | ORAL_TABLET | Freq: Two times a day (BID) | ORAL | Status: DC
  Administered 2018-08-25 – 2018-08-30 (×10): 5 mg via ORAL
  Filled 2018-08-25 (×10): qty 1

## 2018-08-25 MED ORDER — APIXABAN 5 MG PO TABS: 5.0000 mg | ORAL_TABLET | Freq: Two times a day (BID) | ORAL | Status: DC

## 2018-08-25 MED ORDER — HEPARIN BOLUS VIA INFUSION
2000.0000 [IU] | Freq: Once | INTRAVENOUS | Status: AC
  Administered 2018-08-25: 2000 [IU] via INTRAVENOUS
  Filled 2018-08-25: qty 2000

## 2018-08-25 MED ORDER — HEPARIN (PORCINE) IN NACL 100-0.45 UNIT/ML-% IJ SOLN: 1300.0000 [IU]/h | INTRAMUSCULAR | Status: AC

## 2018-08-25 MED ORDER — LIDOCAINE HCL 1 % IJ SOLN
INTRAMUSCULAR | Status: AC
  Filled 2018-08-25: qty 20

## 2018-08-25 NOTE — Progress Notes (Signed)
[place] for Eliquis Indication: atrial fibrillation  No Known Allergies  Patient Measurements: Height: 5' 6.5" (168.9 cm) Weight: 164 lb (74.4 kg) IBW/kg (Calculated) : 60.45  Vital Signs: Temp: 98.1 F (36.7 C) (09/28 1318) Temp Source: Oral (09/28 1318) BP: 125/82 (09/28 1318) Pulse Rate: 89 (09/28 1318)  Labs: Recent Labs    08/24/18 1626 08/24/18 2205 08/25/18 0340 08/25/18 0713  HGB 13.1  --  13.7  --   HCT 40.2  --  43.1  --   PLT 368  --  357  --   APTT  --  39*  --  35  HEPARINUNFRC  --  >2.20*  --  >2.20*  CREATININE 0.85  --  0.87  --   TROPONINI <0.03  --   --   --     Estimated Creatinine Clearance: 52.9 mL/min (by C-G formula based on SCr of 0.87 mg/dL).   Medications: Infusions:  . cefTRIAXone (ROCEPHIN)  IV 1 g (08/25/18 0057)  . heparin 1,300 Units/hr (08/25/18 1732)     Assessment: Patient's an 83 y.o F with hx afib on Eliquis PTA, presented to the ED from PCP's office with c/o CP and SOB.  Chest x-ray shows large recurrent pleural effusion.  Eliquis placed on hold.  Pharmacy is consulted to start heparin for Afib pending thoracentesis  Eliquis 5 mg bid PTA (last dose taken on 9/27 at 1045) S/p IR thoracentesis on 9/28 with 1.4L out, no immediate post-procedure complications. APTT 152, goal on heparin at 1300 units/hr CBC:  Hgb 13.7, Plt 357, stable/WNL No bleeding or complications reported.  Goal of Therapy:  Monitor platelets by anticoagulation protocol: Yes   Plan:  Stop Heparin at 19:00 Resume Eliquis 5 mg PO BID, next dose at 20:00 Pharmacy will s/o note writing, but will f/u peripherally. Monitor SCr and post-procedure complications.   Gretta Arab PharmD, BCPS Pager [phone] 08/25/2018 5:53 PM

## 2018-08-25 NOTE — Progress Notes (Signed)
PROGRESS NOTE    Pamela Mcguire  HCW:[id number] DOB: 1935-08-06 DOA: 08/24/2018 PCP: Pamela Koch, MD    Brief Narrative:  83 y.o. female, with past medical history significant for right lung adenocarcinoma status post radiation and chemotherapy in the past and now on surveillance and no active therapy, history of breast cancer status post right mastectomy and colon cancer in remission presenting today with shortness of breath worsening for the last few days.  Patient denies any fever or chills.  Patient has a history of atrial fibrillation on Eliquis and has a history of hypoxemia on chronic oxygen at home and reports associated chest tightness radiating to the back.  Patient reports sputum which is yellowish in color.  Patient denies any dizziness or loss of consciousness.  Patient denies any abdominal pain, diarrhea or urinary  Symptoms .  In the emergency room the chest x-ray shows large pleural effusion knowing that the patient has a history of pleural effusion status post thoracentesis on July of this year.  This has been recurrent and the patient is concerned.  Discussions about goals of care were done and the patient asked for the thoracentesis to be done first for therapeutic and diagnosis purposes  Assessment & Plan:   Active Problems:   Pleural effusion  1.  Large, recurrent right pleural effusion with history of right lung adeno CA -Recurrent R sided effusion noted on imaging -Chart reviewed. Historically fluid has returned transudative per Light's criteria -IR consulted for thoracentesis. Pt now s/p 1.4L out via US guided thoracentesis -Thus far transudative based on protein analysis. LDH is pending -continue to wean O2 as tolerated  2.  Right lung adeno CA status post chemotherapy and radiation -Appears stable at present -would have pt follow up with Oncology when discharged  3.  History of anxiety/depression -Appears stable at present -continued on SSIR  4.   Atrial fibrillation, controlled rate on Eliquis, Cardizem and digoxin. -Rate controlled at present -As pt is now s/p thoracentesis, will transition off heparin gtt back to eliquis  5.  Urinary tract infection -Pt was continued on rocephin -Afebrile  6.  History of colon cancer status post colon resection -Appears stable at this time  7.  History of breast cancer status post right mastectomy -Presently stable  DVT prophylaxis: Therapeutic anticoagulation Code Status: No CPR Family Communication: Pt in room, family not at bedside Disposition Plan: Uncertain at this time  Consultants:   IR  Procedures:     Antimicrobials: Anti-infectives (From admission, onward)   Start     Dose/Rate Route Frequency Ordered Stop   08/24/18 2300  cefTRIAXone (ROCEPHIN) 1 g in sodium chloride 0.9 % 100 mL IVPB     1 g 200 mL/hr over 30 Minutes Intravenous Every 24 hours 08/24/18 2201     08/24/18 1830  cephALEXin (KEFLEX) capsule 500 mg  Status:  Discontinued     500 mg Oral  Once 08/24/18 1823 08/24/18 2201       Subjective: No complaints at this time  Objective: Vitals:   08/25/18 0953 08/25/18 1012 08/25/18 1016 08/25/18 1318  BP: 136/66 131/75 124/66 125/82  Pulse:    89  Resp:    18  Temp:    98.1 F (36.7 C)  TempSrc:    Oral  SpO2:    (!) 87%  Weight:      Height:        Intake/Output Summary (Last 24 hours) at 08/25/2018 1746 Last data filed at 08/25/2018 1732  Gross per 24 hour  Intake 404.93 ml  Output 1300 ml  Net -895.07 ml   Filed Weights   08/24/18 1558  Weight: 74.4 kg    Examination:  General exam: Appears calm and comfortable  Respiratory system: Clear to auscultation. Respiratory effort normal. Cardiovascular system: S1 & S2 heard, RRR Gastrointestinal system: Abdomen is nondistended, soft and nontender. No organomegaly or masses felt. Normal bowel sounds heard. Central nervous system: Alert and oriented. No focal neurological  deficits. Extremities: Symmetric 5 x 5 power. Skin: No rashes, lesions  Psychiatry: Judgement and insight appear normal. Mood & affect appropriate.   Data Reviewed: I have personally reviewed following labs and imaging studies  CBC: Recent Labs  Lab 08/24/18 1626 08/25/18 0340  WBC 7.8 8.3  NEUTROABS 6.2  --   HGB 13.1 13.7  HCT 40.2 43.1  MCV 93.5 94.5  PLT 368 834   Basic Metabolic Panel: Recent Labs  Lab 08/24/18 1626 08/25/18 0340  NA 144 143  K 3.6 3.6  CL 99 101  CO2 32 33*  GLUCOSE 106* 114*  BUN 11 10  CREATININE 0.85 0.87  CALCIUM 9.3 9.1   GFR: Estimated Creatinine Clearance: 52.9 mL/min (by C-G formula based on SCr of 0.87 mg/dL). Liver Function Tests: Recent Labs  Lab 08/24/18 1626  AST 20  ALT 13  ALKPHOS 50  BILITOT 1.2  PROT 7.2  ALBUMIN 3.9   No results for input(s): LIPASE, AMYLASE in the last 168 hours. No results for input(s): AMMONIA in the last 168 hours. Coagulation Profile: No results for input(s): INR, PROTIME in the last 168 hours. Cardiac Enzymes: Recent Labs  Lab 08/24/18 1626  TROPONINI <0.03   BNP (last 3 results) No results for input(s): PROBNP in the last 8760 hours. HbA1C: No results for input(s): HGBA1C in the last 72 hours. CBG: No results for input(s): GLUCAP in the last 168 hours. Lipid Profile: No results for input(s): CHOL, HDL, LDLCALC, TRIG, CHOLHDL, LDLDIRECT in the last 72 hours. Thyroid Function Tests: No results for input(s): TSH, T4TOTAL, FREET4, T3FREE, THYROIDAB in the last 72 hours. Anemia Panel: No results for input(s): VITAMINB12, FOLATE, FERRITIN, TIBC, IRON, RETICCTPCT in the last 72 hours. Sepsis Labs: No results for input(s): PROCALCITON, LATICACIDVEN in the last 168 hours.  No results found for this or any previous visit (from the past 240 hour(s)).   Radiology Studies: Dg Chest 1 View  Result Date: 08/25/2018 CLINICAL DATA:  Central chest tightness. Status post right thoracentesis.  EXAM: CHEST  1 VIEW COMPARISON:  08/25/2018 FINDINGS: Interval right thoracentesis. Small partially loculated right pleural effusion which is significantly decreased in size compared with the prior exam. No left pleural effusion. Mild bilateral interstitial thickening. Right basilar airspace disease likely reflecting atelectasis. No pneumothorax. Stable cardiomegaly. Thoracic aortic atherosclerosis. No acute osseous abnormality. IMPRESSION: 1. Interval right thoracentesis with decrease in the right pleural effusion. Small persistent partially loculated right pleural effusion. No pneumothorax. Electronically Signed   By: Kathreen Devoid   On: 08/25/2018 11:20   Ct Angio Chest Pe W Or Wo Contrast  Result Date: 08/24/2018 CLINICAL DATA:  Shortness of breath. History of non-small cell lung cancer. EXAM: CT ANGIOGRAPHY CHEST WITH CONTRAST TECHNIQUE: Multidetector CT imaging of the chest was performed using the standard protocol during bolus administration of intravenous contrast. Multiplanar CT image reconstructions and MIPs were obtained to evaluate the vascular anatomy. CONTRAST:  168mL ISOVUE-370 IOPAMIDOL (ISOVUE-370) INJECTION 76% COMPARISON:  Chest x-ray from today. CTA of the chest  January 02, 2018. FINDINGS: Cardiovascular: Cardiomegaly. Coronary artery calcifications. Atherosclerotic change in the nonaneurysmal aorta. No aortic dissection. Evaluation of more peripheral pulmonary arteries is difficult due to respiratory motion. Evaluation of right-sided pulmonary arteries is limited due to significant atelectasis due to the large effusion. Within this limitation, no pulmonary emboli identified. Mediastinum/Nodes: Apparent right mastectomy. Visualize portions of the left breast are normal. No axillary or cervical adenopathy identified. Mild nodularity in the right thyroid is unchanged, requiring no follow-up. A right pretracheal node on series 8, image 100 measures 11 mm today, unchanged. The cardiomediastinal  silhouette is shifted to the right due the large right-sided pleural effusion. It is difficult to compare the remainder of the lymph nodes on today's study to the previous but there has been no other definitive change. No left-sided pleural effusion. No definitive pericardial effusion. Lungs/Pleura: The central airways are patent. No pneumothorax. There is a large right-sided pleural effusion which is larger in the interval. The amount of aerated lung has decreased. The previously identified right lower lobe nodules not visualized on this study with this portion of lung is not aerated. Mild nodularity anteriorly in the right lung measuring 7.7 mm on series 9, image 106 is difficult to compare to previous studies due to the large effusion. Post therapeutic changes in the right perihilar region. Probable mild pulmonary edema. No other new nodules or masses identified in the lungs. Upper Abdomen: No acute abnormality. Musculoskeletal: Compression fracture of an upper lumbar vertebral body, unchanged. No other acute bony changes. Review of the MIP images confirms the above findings. IMPRESSION: 1. No pulmonary emboli identified. 2. There is a large right pleural effusion which is much larger in the interval. The amount of aerated lung on the right has decreased. 3. Cardiomegaly and mild pulmonary edema. 4. The previously identified right-sided nodule is obscured by atelectatic lung today. 5. 7.7 mm nodularity anteriorly in the right lung could represent focal atelectasis or a new nodule. Recommend short-term follow-up to in further evaluate after the effusion has resolved. 6. Post therapeutic changes in the right perihilar region. 7. Atherosclerotic changes in the aorta. Coronary artery calcifications. 8. Stable compression fracture of an upper lumbar vertebral body. Aortic Atherosclerosis (ICD10-I70.0). Electronically Signed   By: Dorise Bullion III M.D   On: 08/24/2018 17:58   Dg Chest Port 1 View  Result Date:  08/25/2018 CLINICAL DATA:  83 year old female with hypoxemia. EXAM: PORTABLE CHEST 1 VIEW COMPARISON:  Chest radiograph dated 08/24/2018 FINDINGS: There has been interval increase in the size of the right-sided pleural effusion since the prior radiograph with increase in associated compressive atelectasis of the right lung. Diminished right lung aeration. The left lung is clear. Stable cardiac silhouette. Atherosclerotic calcification of the aorta. Right chest wall surgical clips. No acute osseous pathology. IMPRESSION: Increase in the size of the right-sided pleural effusion and right lung atelectasis. Electronically Signed   By: Anner Crete M.D.   On: 08/25/2018 04:49   Dg Chest Port 1 View  Result Date: 08/24/2018 CLINICAL DATA:  Chest pain and shortness of breath. Breast cancer. Non-small-cell lung cancer. Atrial fibrillation. EXAM: PORTABLE CHEST 1 VIEW COMPARISON:  Chest x-ray.  06/05/2018. CT chest earlier today. FINDINGS: Cardiomegaly. Calcified tortuous aorta. Large RIGHT pleural effusion, uncertain etiology. RIGHT hilar clips. RIGHT mastectomy. Osteopenia. Interstitial prominence of the RIGHT lung, uncertain significance. IMPRESSION: Large RIGHT effusion is the dominant abnormality. Marked worsening aeration from prior chest radiograph. Consider RIGHT-sided thoracentesis as a diagnostic and therapeutic maneuver. Electronically Signed  By: Staci Righter M.D.   On: 08/24/2018 18:04   US Thoracentesis Asp Pleural Space W/img Guide  Result Date: 08/25/2018 INDICATION: Patient with right pleural effusion. Request is made for diagnostic and therapeutic right thoracentesis. EXAM: ULTRASOUND GUIDED DIAGNOSTIC AND THERAPEUTIC RIGHT THORACENTESIS MEDICATIONS: 10 mL 1% lidocaine COMPLICATIONS: None immediate. PROCEDURE: An ultrasound guided thoracentesis was thoroughly discussed with the patient and questions answered. The benefits, risks, alternatives and complications were also discussed. The patient  understands and wishes to proceed with the procedure. Written consent was obtained. Ultrasound was performed to localize and mark an adequate pocket of fluid in the right chest. The area was then prepped and draped in the normal sterile fashion. 1% Lidocaine was used for local anesthesia. Under ultrasound guidance a 6 Fr Safe-T-Centesis catheter was introduced. Thoracentesis was performed. The catheter was removed and a dressing applied. FINDINGS: A total of approximately 1.4 liters of yellow fluid was removed. Samples were sent to the laboratory as requested by the clinical team. IMPRESSION: Successful ultrasound guided diagnostic and therapeutic right thoracentesis yielding 1.4 liters of pleural fluid. Read by: Brynda Greathouse PA-C Electronically Signed   By: Marybelle Killings M.D.   On: 08/25/2018 11:32    Scheduled Meds: . atorvastatin  40 mg Oral q1800  . digoxin  0.125 mg Oral Daily  . diltiazem  240 mg Oral Daily  . escitalopram  10 mg Oral Daily  . ferrous sulfate  325 mg Oral QPM  . fesoterodine  4 mg Oral Daily  . furosemide  20 mg Oral Daily  . lidocaine      . loratadine  10 mg Oral QHS  . mirtazapine  15 mg Oral QHS  . pantoprazole  40 mg Oral Daily   Continuous Infusions: . cefTRIAXone (ROCEPHIN)  IV 1 g (08/25/18 0057)  . heparin 1,300 Units/hr (08/25/18 1732)     LOS: 1 day   Marylu Lund, MD [facility] Pager On Amion  If 7PM-7AM, please contact night-coverage 08/25/2018, 5:46 PM

## 2018-08-25 NOTE — Progress Notes (Signed)
[facility] for heparin Indication: atrial fibrillation (home Eliquis on hold)  No Known Allergies  Patient Measurements: Height: 5' 6.5" (168.9 cm) Weight: 164 lb (74.4 kg) IBW/kg (Calculated) : 60.45 Heparin Dosing Weight: 74 kg  Vital Signs: Temp: 98 F (36.7 C) (09/28 0512) Temp Source: Oral (09/28 0512) BP: 177/85 (09/28 0512) Pulse Rate: 72 (09/28 0512)  Labs: Recent Labs    08/24/18 1626 08/24/18 2205 08/25/18 0340 08/25/18 0713  HGB 13.1  --  13.7  --   HCT 40.2  --  43.1  --   PLT 368  --  357  --   APTT  --  39*  --  35  HEPARINUNFRC  --  >2.20*  --   --   CREATININE 0.85  --  0.87  --   TROPONINI <0.03  --   --   --     Estimated Creatinine Clearance: 52.9 mL/min (by C-G formula based on SCr of 0.87 mg/dL).   Medications: Infusions:  . cefTRIAXone (ROCEPHIN)  IV 1 g (08/25/18 0057)  . heparin 1,100 Units/hr (08/24/18 2231)     Assessment: Patient's an 83 y.o F with hx afib on Eliquis PTA, presented to the ED from PCP's office with c/o CP and SOB.  Chest x-ray shows large recurrent pleural effusion.  Eliquis placed on hold.  Pharmacy is consulted to start heparin for Afib pending thoracentesis  Eliquis 5 mg bid PTA (last dose taken on 9/27 at 1045)  Today, 08/25/2018: APTT 35, below goal on heparin at 1100 units/hr CBC:  Hgb 13.7, Plt 357, stable/WNL No bleeding or complications reported.  Goal of Therapy:  Heparin level 0.3-0.7 units/ml aPTT 66-102 seconds Monitor platelets by anticoagulation protocol: Yes   Plan:  Give heparin 2000 units bolus IV x 1 Increase to heparin IV infusion at 1300 units/hr Heparin level and aPTT 8 hours after rate change Daily heparin level and CBC Continue to monitor H&H and platelets   Gretta Arab PharmD, BCPS Pager [phone] 08/25/2018 8:22 AM

## 2018-08-25 NOTE — Procedures (Signed)
PROCEDURE SUMMARY:  Successful US guided right therapeutic thoracentesis. Yielded 1.4 liters of yellow fluid. Pt tolerated procedure well, however procedure was stopped prior to removal of all fluid due to chest discomfort. No immediate complications.  Specimen was sent for labs. CXR ordered.  Docia Barrier PA-C 08/25/2018 10:30 AM

## 2018-08-26 LAB — GRAM STAIN

## 2018-08-26 LAB — CBC
HCT: 45.2 % (ref 36.0–46.0)
Hemoglobin: 14.4 g/dL (ref 12.0–15.0)
MCH: 29.6 pg (ref 26.0–34.0)
MCHC: 31.9 g/dL (ref 30.0–36.0)
MCV: 93 fL (ref 78.0–100.0)
Platelets: 321 K/uL (ref 150–400)
RBC: 4.86 MIL/uL (ref 3.87–5.11)
RDW: 14.2 % (ref 11.5–15.5)
WBC: 8.9 K/uL (ref 4.0–10.5)

## 2018-08-26 MED ORDER — FUROSEMIDE 10 MG/ML IJ SOLN
40.0000 mg | Freq: Two times a day (BID) | INTRAMUSCULAR | Status: DC
  Administered 2018-08-26 – 2018-08-29 (×8): 40 mg via INTRAVENOUS
  Filled 2018-08-26 (×8): qty 4

## 2018-08-26 NOTE — Progress Notes (Signed)
SATURATION QUALIFICATIONS: (This note is used to comply with regulatory documentation for home oxygen)  Patient Saturations on Room Air at Rest = 89%  Patient Saturations on Room Air while Ambulating = 87%  Patient Saturations on 4 Liters of oxygen while Ambulating = 90%  Please briefly explain why patient needs home oxygen:

## 2018-08-26 NOTE — Progress Notes (Signed)
PROGRESS NOTE    Pamela Mcguire  BDZ:[id number] DOB: 03-31-1935 DOA: 08/24/2018 PCP: Hoyt Koch, MD    Brief Narrative:  83 y.o. female, with past medical history significant for right lung adenocarcinoma status post radiation and chemotherapy in the past and now on surveillance and no active therapy, history of breast cancer status post right mastectomy and colon cancer in remission presenting today with shortness of breath worsening for the last few days.  Patient denies any fever or chills.  Patient has a history of atrial fibrillation on Eliquis and has a history of hypoxemia on chronic oxygen at home and reports associated chest tightness radiating to the back.  Patient reports sputum which is yellowish in color.  Patient denies any dizziness or loss of consciousness.  Patient denies any abdominal pain, diarrhea or urinary  Symptoms .  In the emergency room the chest x-ray shows large pleural effusion knowing that the patient has a history of pleural effusion status post thoracentesis on July of this year.  This has been recurrent and the patient is concerned.  Discussions about goals of care were done and the patient asked for the thoracentesis to be done first for therapeutic and diagnosis purposes  Assessment & Plan:   Active Problems:   Pleural effusion  1.  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 Historically fluid has returned transudative per Light's criteria -IR consulted for thoracentesis. Pt now s/p 1.4L out via US guided thoracentesis -Transudative based on protein and LDH analysis, likley related to acute CHF exacerbation. -continue to wean O2 as tolerated -Continue patient on IV lasix as tolerated -Will consult PT  2.  Right lung adeno CA status post chemotherapy and radiation -Appears stable currently -would have pt follow up with Oncology when  discharged  3.  History of anxiety/depression -continued on SSIR -Stable at present  4.  Atrial fibrillation, controlled rate on Eliquis, Cardizem and digoxin. -Rate controlled at present -Resumed eliquis post thoracentesis  5.  Urinary tract infection -Pt was continued on rocephin -Patient reports symptoms of dysuria with UA suggestive of UTI -cont abx -Clinically improving  6.  History of colon cancer status post colon resection -Currently stable at this time  7.  History of breast cancer status post right mastectomy -Stable at present  DVT prophylaxis: Therapeutic anticoagulation Code Status: No CPR Family Communication: Pt in room, family not at bedside Disposition Plan: Uncertain at this time  Consultants:   IR  Procedures:   Thoracentesis 9/28  Antimicrobials: Anti-infectives (From admission, onward)   Start     Dose/Rate Route Frequency Ordered Stop   08/24/18 2300  cefTRIAXone (ROCEPHIN) 1 g in sodium chloride 0.9 % 100 mL IVPB     1 g 200 mL/hr over 30 Minutes Intravenous Every 24 hours 08/24/18 2201     08/24/18 1830  cephALEXin (KEFLEX) capsule 500 mg  Status:  Discontinued     500 mg Oral  Once 08/24/18 1823 08/24/18 2201      Subjective: Without complaints at present  Objective: Vitals:   08/26/18 0641 08/26/18 1030 08/26/18 1045 08/26/18 1424  BP: (!) 151/67   (!) 132/57  Pulse: 91   64  Resp:    18  Temp:    (!) 97.5 F (36.4 C)  TempSrc:    Oral  SpO2:  (!) 87% 92% 95%  Weight:      Height:  Intake/Output Summary (Last 24 hours) at 08/26/2018 1623 Last data filed at 08/26/2018 1427 Gross per 24 hour  Intake 750.12 ml  Output 1475 ml  Net -724.88 ml   Filed Weights   08/24/18 1558  Weight: 74.4 kg    Examination: General exam: Awake, laying in bed, in nad Respiratory system: mildly increased respiratory effort, no wheezing Cardiovascular system: regular rate, s1, s2 Gastrointestinal system: Soft, nondistended,  positive BS Central nervous system: CN2-12 grossly intact, strength intact Extremities: Perfused, no clubbing Skin: Normal skin turgor, no notable skin lesions seen Psychiatry: Mood normal // no visual hallucinations    Data Reviewed: I have personally reviewed following labs and imaging studies  CBC: Recent Labs  Lab 08/24/18 1626 08/25/18 0340 08/26/18 0436  WBC 7.8 8.3 8.9  NEUTROABS 6.2  --   --   HGB 13.1 13.7 14.4  HCT 40.2 43.1 45.2  MCV 93.5 94.5 93.0  PLT 368 357 629   Basic Metabolic Panel: Recent Labs  Lab 08/24/18 1626 08/25/18 0340  NA 144 143  K 3.6 3.6  CL 99 101  CO2 32 33*  GLUCOSE 106* 114*  BUN 11 10  CREATININE 0.85 0.87  CALCIUM 9.3 9.1   GFR: Estimated Creatinine Clearance: 52.9 mL/min (by C-G formula based on SCr of 0.87 mg/dL). Liver Function Tests: Recent Labs  Lab 08/24/18 1626  AST 20  ALT 13  ALKPHOS 50  BILITOT 1.2  PROT 7.2  ALBUMIN 3.9   No results for input(s): LIPASE, AMYLASE in the last 168 hours. No results for input(s): AMMONIA in the last 168 hours. Coagulation Profile: No results for input(s): INR, PROTIME in the last 168 hours. Cardiac Enzymes: Recent Labs  Lab 08/24/18 1626  TROPONINI <0.03   BNP (last 3 results) No results for input(s): PROBNP in the last 8760 hours. HbA1C: No results for input(s): HGBA1C in the last 72 hours. CBG: No results for input(s): GLUCAP in the last 168 hours. Lipid Profile: No results for input(s): CHOL, HDL, LDLCALC, TRIG, CHOLHDL, LDLDIRECT in the last 72 hours. Thyroid Function Tests: No results for input(s): TSH, T4TOTAL, FREET4, T3FREE, THYROIDAB in the last 72 hours. Anemia Panel: No results for input(s): VITAMINB12, FOLATE, FERRITIN, TIBC, IRON, RETICCTPCT in the last 72 hours. Sepsis Labs: No results for input(s): PROCALCITON, LATICACIDVEN in the last 168 hours.  Recent Results (from the past 240 hour(s))  Culture, body fluid-bottle     Status: None (Preliminary  result)   Collection Time: 08/25/18 10:13 AM  Result Value Ref Range Status   Specimen Description FLUID  Final   Special Requests BOTTLES DRAWN AEROBIC AND ANAEROBIC  Final   Culture   Final    NO GROWTH < 24 HOURS Performed at [facility], [place] [address] [place], [place] [address]    Report Status PENDING  Incomplete  Gram stain     Status: None   Collection Time: 08/25/18 10:13 AM  Result Value Ref Range Status   Specimen Description FLUID  Final   Special Requests NONE  Final   Gram Stain   Final    MODERATE WBC PRESENT,BOTH PMN AND MONONUCLEAR NO ORGANISMS SEEN Performed at [facility], [address], [place], [place] [address]    Report Status 08/26/2018 FINAL  Final     Radiology Studies: Dg Chest 1 View  Result Date: 08/25/2018 CLINICAL DATA:  Central chest tightness. Status post right thoracentesis. EXAM: CHEST  1 VIEW COMPARISON:  08/25/2018 FINDINGS: Interval right  thoracentesis. Small partially loculated right pleural effusion which is significantly decreased in size compared with the prior exam. No left pleural effusion. Mild bilateral interstitial thickening. Right basilar airspace disease likely reflecting atelectasis. No pneumothorax. Stable cardiomegaly. Thoracic aortic atherosclerosis. No acute osseous abnormality. IMPRESSION: 1. Interval right thoracentesis with decrease in the right pleural effusion. Small persistent partially loculated right pleural effusion. No pneumothorax. Electronically Signed   By: Kathreen Devoid   On: 08/25/2018 11:20   Ct Angio Chest Pe W Or Wo Contrast  Result Date: 08/24/2018 CLINICAL DATA:  Shortness of breath. History of non-small cell lung cancer. EXAM: CT ANGIOGRAPHY CHEST WITH CONTRAST TECHNIQUE: Multidetector CT imaging of the chest was performed using the standard protocol during bolus administration of intravenous contrast. Multiplanar CT image reconstructions and MIPs were obtained to evaluate the vascular anatomy.  CONTRAST:  180mL ISOVUE-370 IOPAMIDOL (ISOVUE-370) INJECTION 76% COMPARISON:  Chest x-ray from today. CTA of the chest January 02, 2018. FINDINGS: Cardiovascular: Cardiomegaly. Coronary artery calcifications. Atherosclerotic change in the nonaneurysmal aorta. No aortic dissection. Evaluation of more peripheral pulmonary arteries is difficult due to respiratory motion. Evaluation of right-sided pulmonary arteries is limited due to significant atelectasis due to the large effusion. Within this limitation, no pulmonary emboli identified. Mediastinum/Nodes: Apparent right mastectomy. Visualize portions of the left breast are normal. No axillary or cervical adenopathy identified. Mild nodularity in the right thyroid is unchanged, requiring no follow-up. A right pretracheal node on series 8, image 100 measures 11 mm today, unchanged. The cardiomediastinal silhouette is shifted to the right due the large right-sided pleural effusion. It is difficult to compare the remainder of the lymph nodes on today's study to the previous but there has been no other definitive change. No left-sided pleural effusion. No definitive pericardial effusion. Lungs/Pleura: The central airways are patent. No pneumothorax. There is a large right-sided pleural effusion which is larger in the interval. The amount of aerated lung has decreased. The previously identified right lower lobe nodules not visualized on this study with this portion of lung is not aerated. Mild nodularity anteriorly in the right lung measuring 7.7 mm on series 9, image 106 is difficult to compare to previous studies due to the large effusion. Post therapeutic changes in the right perihilar region. Probable mild pulmonary edema. No other new nodules or masses identified in the lungs. Upper Abdomen: No acute abnormality. Musculoskeletal: Compression fracture of an upper lumbar vertebral body, unchanged. No other acute bony changes. Review of the MIP images confirms the above  findings. IMPRESSION: 1. No pulmonary emboli identified. 2. There is a large right pleural effusion which is much larger in the interval. The amount of aerated lung on the right has decreased. 3. Cardiomegaly and mild pulmonary edema. 4. The previously identified right-sided nodule is obscured by atelectatic lung today. 5. 7.7 mm nodularity anteriorly in the right lung could represent focal atelectasis or a new nodule. Recommend short-term follow-up to in further evaluate after the effusion has resolved. 6. Post therapeutic changes in the right perihilar region. 7. Atherosclerotic changes in the aorta. Coronary artery calcifications. 8. Stable compression fracture of an upper lumbar vertebral body. Aortic Atherosclerosis (ICD10-I70.0). Electronically Signed   By: Dorise Bullion III M.D   On: 08/24/2018 17:58   Dg Chest Port 1 View  Result Date: 08/25/2018 CLINICAL DATA:  83 year old female with hypoxemia. EXAM: PORTABLE CHEST 1 VIEW COMPARISON:  Chest radiograph dated 08/24/2018 FINDINGS: There has been interval increase in the size of the right-sided pleural effusion since the  prior radiograph with increase in associated compressive atelectasis of the right lung. Diminished right lung aeration. The left lung is clear. Stable cardiac silhouette. Atherosclerotic calcification of the aorta. Right chest wall surgical clips. No acute osseous pathology. IMPRESSION: Increase in the size of the right-sided pleural effusion and right lung atelectasis. Electronically Signed   By: Anner Crete M.D.   On: 08/25/2018 04:49   Dg Chest Port 1 View  Result Date: 08/24/2018 CLINICAL DATA:  Chest pain and shortness of breath. Breast cancer. Non-small-cell lung cancer. Atrial fibrillation. EXAM: PORTABLE CHEST 1 VIEW COMPARISON:  Chest x-ray.  06/05/2018. CT chest earlier today. FINDINGS: Cardiomegaly. Calcified tortuous aorta. Large RIGHT pleural effusion, uncertain etiology. RIGHT hilar clips. RIGHT mastectomy.  Osteopenia. Interstitial prominence of the RIGHT lung, uncertain significance. IMPRESSION: Large RIGHT effusion is the dominant abnormality. Marked worsening aeration from prior chest radiograph. Consider RIGHT-sided thoracentesis as a diagnostic and therapeutic maneuver. Electronically Signed   By: Staci Righter M.D.   On: 08/24/2018 18:04   US Thoracentesis Asp Pleural Space W/img Guide  Result Date: 08/25/2018 INDICATION: Patient with right pleural effusion. Request is made for diagnostic and therapeutic right thoracentesis. EXAM: ULTRASOUND GUIDED DIAGNOSTIC AND THERAPEUTIC RIGHT THORACENTESIS MEDICATIONS: 10 mL 1% lidocaine COMPLICATIONS: None immediate. PROCEDURE: An ultrasound guided thoracentesis was thoroughly discussed with the patient and questions answered. The benefits, risks, alternatives and complications were also discussed. The patient understands and wishes to proceed with the procedure. Written consent was obtained. Ultrasound was performed to localize and mark an adequate pocket of fluid in the right chest. The area was then prepped and draped in the normal sterile fashion. 1% Lidocaine was used for local anesthesia. Under ultrasound guidance a 6 Fr Safe-T-Centesis catheter was introduced. Thoracentesis was performed. The catheter was removed and a dressing applied. FINDINGS: A total of approximately 1.4 liters of yellow fluid was removed. Samples were sent to the laboratory as requested by the clinical team. IMPRESSION: Successful ultrasound guided diagnostic and therapeutic right thoracentesis yielding 1.4 liters of pleural fluid. Read by: Brynda Greathouse PA-C Electronically Signed   By: Marybelle Killings M.D.   On: 08/25/2018 11:32    Scheduled Meds: . apixaban  5 mg Oral BID  . atorvastatin  40 mg Oral q1800  . digoxin  0.125 mg Oral Daily  . diltiazem  240 mg Oral Daily  . escitalopram  10 mg Oral Daily  . ferrous sulfate  325 mg Oral QPM  . fesoterodine  4 mg Oral Daily  .  furosemide  40 mg Intravenous BID  . loratadine  10 mg Oral QHS  . mirtazapine  15 mg Oral QHS  . pantoprazole  40 mg Oral Daily   Continuous Infusions: . cefTRIAXone (ROCEPHIN)  IV 1 g (08/25/18 2316)     LOS: 2 days   Marylu Lund, MD [facility] Pager On Amion  If 7PM-7AM, please contact night-coverage 08/26/2018, 4:23 PM

## 2018-08-27 ENCOUNTER — Inpatient Hospital Stay (HOSPITAL_COMMUNITY): Payer: Medicare HMO

## 2018-08-27 DIAGNOSIS — I340 Nonrheumatic mitral (valve) insufficiency: Secondary | ICD-10-CM

## 2018-08-27 LAB — BASIC METABOLIC PANEL
ANION GAP: 12 (ref 5–15)
BUN: 10 mg/dL (ref 8–23)
CO2: 33 mmol/L — AB (ref 22–32)
Calcium: 8.8 mg/dL — ABNORMAL LOW (ref 8.9–10.3)
Chloride: 96 mmol/L — ABNORMAL LOW (ref 98–111)
Creatinine, Ser: 0.83 mg/dL (ref 0.44–1.00)
GFR calc Af Amer: >60 mL/min (ref >60)
GFR calc non Af Amer: >60 mL/min (ref >60)
GLUCOSE: 141 mg/dL — AB (ref 70–99)
Potassium: 3.1 mmol/L — ABNORMAL LOW (ref 3.5–5.1)
Sodium: 141 mmol/L (ref 135–145)

## 2018-08-27 LAB — CBC
HEMATOCRIT: 43.3 % (ref 36.0–46.0)
Hemoglobin: 14 g/dL (ref 12.0–15.0)
MCH: 30.4 pg (ref 26.0–34.0)
MCHC: 32.3 g/dL (ref 30.0–36.0)
MCV: 93.9 fL (ref 78.0–100.0)
Platelets: 359 10*3/uL (ref 150–400)
RBC: 4.61 MIL/uL (ref 3.87–5.11)
RDW: 14.2 % (ref 11.5–15.5)
WBC: 8.7 10*3/uL (ref 4.0–10.5)

## 2018-08-27 LAB — PH, BODY FLUID: pH, Body Fluid: 7.5

## 2018-08-27 LAB — ECHOCARDIOGRAM COMPLETE
Height: 66.5 in
Weight: 2624 oz

## 2018-08-27 MED ORDER — POTASSIUM CHLORIDE CRYS ER 20 MEQ PO TBCR
40.0000 meq | EXTENDED_RELEASE_TABLET | Freq: Two times a day (BID) | ORAL | Status: AC
  Administered 2018-08-27 (×2): 40 meq via ORAL
  Filled 2018-08-27 (×2): qty 2

## 2018-08-27 NOTE — Progress Notes (Addendum)
SATURATION QUALIFICATIONS: (This note is used to comply with regulatory documentation for home oxygen)  Patient Saturations on Room Air at Rest = 87%  Patient Saturations on Room Air while Ambulating =  Unable to ambulate  Without oxygen, pt states requires home oxygen  Patient Saturations on 2Liters of oxygen while Ambulating = 92%  Please briefly explain why patient needs home oxygen: Pt states she uses home oxygen did not know liters. Pt ambulated with O2 2l.   Theora Gianotti, RN

## 2018-08-27 NOTE — Progress Notes (Signed)
PROGRESS NOTE    Pamela Mcguire  EPP:[id number] DOB: 20-Aug-1935 DOA: 08/24/2018 PCP: Hoyt Koch, MD    Brief Narrative:  83 y.o. female, with past medical history significant for right lung adenocarcinoma status post radiation and chemotherapy in the past and now on surveillance and no active therapy, history of breast cancer status post right mastectomy and colon cancer in remission presenting today with shortness of breath worsening for the last few days.  Patient denies any fever or chills.  Patient has a history of atrial fibrillation on Eliquis and has a history of hypoxemia on chronic oxygen at home and reports associated chest tightness radiating to the back.  Patient reports sputum which is yellowish in color.  Patient denies any dizziness or loss of consciousness.  Patient denies any abdominal pain, diarrhea or urinary  Symptoms .  In the emergency room the chest x-ray shows large pleural effusion knowing that the patient has a history of pleural effusion status post thoracentesis on July of this year.  This has been recurrent and the patient is concerned.  Discussions about goals of care were done and the patient asked for the thoracentesis to be done first for therapeutic and diagnosis purposes  Assessment & Plan:   Active Problems:   Pleural effusion  1.  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 Historically fluid has returned transudative per Light's criteria -IR consulted for thoracentesis. Pt now s/p 1.4L out via US guided thoracentesis -Transudative based on protein and LDH analysis, likley related to acute CHF exacerbation. -continue to wean O2 as tolerated -Continue patient on IV lasix as tolerated. Diuresing well -PT consulted with recommendations for SNF -Repeat bmet in AM  2.  Right lung adeno CA status post chemotherapy and radiation -Appears  stable currently -Will have pt follow up with Oncology when discharged  3.  History of anxiety/depression -continued on SSIR -Presently stable  4.  Atrial fibrillation, controlled rate on Eliquis, Cardizem and digoxin. -Rate controlled at present -Continued on eliquis  5.  Urinary tract infection -Pt was continued on rocephin -Patient reports symptoms of dysuria with UA suggestive of UTI -cont empiric abx -Clinically imroving -Urine culture was not obtained at time of admission and pt had already received abx, thus urine culture likely will not be useful  6.  History of colon cancer status post colon resection -Currently stable at this time  7.  History of breast cancer status post right mastectomy -presently stable  DVT prophylaxis: Therapeutic anticoagulation Code Status: No CPR Family Communication: Pt in room, family not at bedside Disposition Plan: Uncertain at this time  Consultants:   IR  Procedures:   Thoracentesis 9/28  Antimicrobials: Anti-infectives (From admission, onward)   Start     Dose/Rate Route Frequency Ordered Stop   08/24/18 2300  cefTRIAXone (ROCEPHIN) 1 g in sodium chloride 0.9 % 100 mL IVPB     1 g 200 mL/hr over 30 Minutes Intravenous Every 24 hours 08/24/18 2201     08/24/18 1830  cephALEXin (KEFLEX) capsule 500 mg  Status:  Discontinued     500 mg Oral  Once 08/24/18 1823 08/24/18 2201      Subjective: Reports feeling better today  Objective: Vitals:   08/26/18 1424 08/26/18 2156 08/27/18 0614 08/27/18 1412  BP: (!) 132/57 129/66 (!) 144/91 133/76  Pulse: 64 64 89 79  Resp: 18 16 16 20   Temp: (!) 97.5  F (36.4 C) 98.1 F (36.7 C) 98.7 F (37.1 C)   TempSrc: Oral Oral Oral   SpO2: 95% 97% 96% 98%  Weight:      Height:        Intake/Output Summary (Last 24 hours) at 08/27/2018 1815 Last data filed at 08/26/2018 2304 Gross per 24 hour  Intake 320 ml  Output 750 ml  Net -430 ml   Filed Weights   08/24/18 1558    Weight: 74.4 kg    Examination: General exam: Conversant, in no acute distress Respiratory system: normal chest rise, clear, no audible wheezing, remains with [place] Cardiovascular system: regular rhythm, s1-s2 Gastrointestinal system: Nondistended, nontender, pos BS Central nervous system: No seizures, no tremors Extremities: No cyanosis, no joint deformities Skin: No rashes, no pallor Psychiatry: Affect normal // no auditory hallucinations   Data Reviewed: I have personally reviewed following labs and imaging studies  CBC: Recent Labs  Lab 08/24/18 1626 08/25/18 0340 08/26/18 0436 08/27/18 0431  WBC 7.8 8.3 8.9 8.7  NEUTROABS 6.2  --   --   --   HGB 13.1 13.7 14.4 14.0  HCT 40.2 43.1 45.2 43.3  MCV 93.5 94.5 93.0 93.9  PLT 368 357 321 578   Basic Metabolic Panel: Recent Labs  Lab 08/24/18 1626 08/25/18 0340 08/27/18 0431  NA 144 143 141  K 3.6 3.6 3.1*  CL 99 101 96*  CO2 32 33* 33*  GLUCOSE 106* 114* 141*  BUN 11 10 10   CREATININE 0.85 0.87 0.83  CALCIUM 9.3 9.1 8.8*   GFR: Estimated Creatinine Clearance: 55.5 mL/min (by C-G formula based on SCr of 0.83 mg/dL). Liver Function Tests: Recent Labs  Lab 08/24/18 1626  AST 20  ALT 13  ALKPHOS 50  BILITOT 1.2  PROT 7.2  ALBUMIN 3.9   No results for input(s): LIPASE, AMYLASE in the last 168 hours. No results for input(s): AMMONIA in the last 168 hours. Coagulation Profile: No results for input(s): INR, PROTIME in the last 168 hours. Cardiac Enzymes: Recent Labs  Lab 08/24/18 1626  TROPONINI <0.03   BNP (last 3 results) No results for input(s): PROBNP in the last 8760 hours. HbA1C: No results for input(s): HGBA1C in the last 72 hours. CBG: No results for input(s): GLUCAP in the last 168 hours. Lipid Profile: No results for input(s): CHOL, HDL, LDLCALC, TRIG, CHOLHDL, LDLDIRECT in the last 72 hours. Thyroid Function Tests: No results for input(s): TSH, T4TOTAL, FREET4, T3FREE, THYROIDAB in the last  72 hours. Anemia Panel: No results for input(s): VITAMINB12, FOLATE, FERRITIN, TIBC, IRON, RETICCTPCT in the last 72 hours. Sepsis Labs: No results for input(s): PROCALCITON, LATICACIDVEN in the last 168 hours.  Recent Results (from the past 240 hour(s))  Culture, body fluid-bottle     Status: None (Preliminary result)   Collection Time: 08/25/18 10:13 AM  Result Value Ref Range Status   Specimen Description FLUID  Final   Special Requests BOTTLES DRAWN AEROBIC AND ANAEROBIC  Final   Culture   Final    NO GROWTH 2 DAYS Performed at [facility], [address] [place], [place] [address]    Report Status PENDING  Incomplete  Gram stain     Status: None   Collection Time: 08/25/18 10:13 AM  Result Value Ref Range Status   Specimen Description FLUID  Final   Special Requests NONE  Final   Gram Stain   Final    MODERATE WBC PRESENT,BOTH PMN AND MONONUCLEAR NO ORGANISMS SEEN  Performed at [facility], [place] [address] [place], [place] [address]    Report Status 08/26/2018 FINAL  Final     Radiology Studies: No results found.  Scheduled Meds: . apixaban  5 mg Oral BID  . atorvastatin  40 mg Oral q1800  . digoxin  0.125 mg Oral Daily  . diltiazem  240 mg Oral Daily  . escitalopram  10 mg Oral Daily  . ferrous sulfate  325 mg Oral QPM  . fesoterodine  4 mg Oral Daily  . furosemide  40 mg Intravenous BID  . loratadine  10 mg Oral QHS  . mirtazapine  15 mg Oral QHS  . pantoprazole  40 mg Oral Daily  . potassium chloride  40 mEq Oral BID   Continuous Infusions: . cefTRIAXone (ROCEPHIN)  IV Stopped (08/26/18 2304)     LOS: 3 days   Marylu Lund, MD [facility] Pager On Amion  If 7PM-7AM, please contact night-coverage 08/27/2018, 6:15 PM

## 2018-08-27 NOTE — Evaluation (Signed)
Physical Therapy Evaluation Patient Details Name: Pamela Mcguire MRN: [id number] DOB: [birth year] Today's Date: 08/27/2018   History of Present Illness  83 y.o. 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 Pt currently with functional limitations due to the deficits listed below (see PT Problem List). Pt requiring min assist to mobilize and reports dyspnea with exertion.  Pt lives in [place] and feels unable to return due to requiring more assist.  Recommend SNF upon d/c to improve pt's mobility and independence prior to return to ILF. Pt will benefit from skilled PT to increase their independence and safety with mobility to allow discharge to the venue listed below.       Follow Up Recommendations SNF    Equipment Recommendations  None recommended by PT    Recommendations for Other Services       Precautions / Restrictions Precautions Precautions: Fall Precaution Comments: chronic oxygen      Mobility  Bed Mobility Overal bed mobility: Needs Assistance Bed Mobility: Supine to Sit     Supine to sit: Min guard;HOB elevated     General bed mobility comments: verbal cues for technique  Transfers Overall transfer level: Needs assistance Equipment used: Rolling walker (2 wheeled) Transfers: Sit to/from Stand Sit to Stand: Min assist         General transfer comment: verbal cues for hand placement, assist to rise and steady  Ambulation/Gait Ambulation/Gait assistance: Min assist Gait Distance (Feet): 20 Feet Assistive device: Rolling walker (2 wheeled) Gait  Pattern/deviations: Step-through pattern;Decreased stride length;Narrow base of support     General Gait Details: verbal cues for sequence, RW positioning, distance limited by fatigue and dyspnea, Spo2 91% on 2L O2 [place]  Stairs            Wheelchair Mobility    Modified Rankin (Stroke Patients Only)       Balance Overall balance assessment: Needs assistance         Standing balance support: Bilateral upper extremity supported Standing balance-Leahy Scale: Poor                               Pertinent Vitals/Pain Pain Assessment: No/denies pain    Home Living Family/patient expects to be discharged to:: Skilled nursing facility     Type of Home: Independent living facility         Home Equipment: Walker - 4 wheels      Prior Function Level of Independence: Needs assistance   Gait / Transfers Assistance Needed: uses rollator, able to ambulate to dining room  ADL's / Homemaking Assistance Needed: pt reports she can before her own ADLs, ILF only assists with meds        Hand Dominance        Extremity/Trunk Assessment        Lower Extremity Assessment Lower Extremity Assessment: Generalized weakness       Communication   Communication: HOH  Cognition Arousal/Alertness: Awake/alert Behavior During Therapy: Flat affect Overall Cognitive Status: No family/caregiver present to determine baseline cognitive functioning  General Comments: hx dementia per chart      General Comments      Exercises     Assessment/Plan    PT Assessment Patient needs continued PT services  PT Problem List Decreased mobility;Decreased strength;Decreased activity tolerance;Decreased knowledge of use of DME;Cardiopulmonary status limiting activity       PT Treatment Interventions DME instruction;Therapeutic activities;Gait training;Therapeutic exercise;Patient/family education;Functional mobility  training;Balance training    PT Goals (Current goals can be found in the Care Plan section)  Acute Rehab PT Goals PT Goal Formulation: With patient Time For Goal Achievement: 09/03/18 Potential to Achieve Goals: Good    Frequency Min 2X/week   Barriers to discharge        Co-evaluation               AM-PAC PT "6 Clicks" Daily Activity  Outcome Measure Difficulty turning over in bed (including adjusting bedclothes, sheets and blankets)?: A Little Difficulty moving from lying on back to sitting on the side of the bed? : Unable Difficulty sitting down on and standing up from a chair with arms (e.g., wheelchair, bedside commode, etc,.)?: Unable Help needed moving to and from a bed to chair (including a wheelchair)?: A Little Help needed walking in hospital room?: A Little Help needed climbing 3-5 steps with a railing? : A Lot 6 Click Score: 13    End of Session Equipment Utilized During Treatment: Gait belt;Oxygen Activity Tolerance: Patient tolerated treatment well Patient left: in chair;with call bell/phone within reach;with chair alarm set Nurse Communication: Mobility status PT Visit Diagnosis: Other abnormalities of gait and mobility (R26.89)    Time: 6770-3403 PT Time Calculation (min) (ACUTE ONLY): 15 min   Charges:   PT Evaluation $PT Eval Low Complexity: [place], PT, DPT Acute Rehabilitation Services Office: [phone] Pager: [phone]   York Ram E 08/27/2018, 1:19 PM

## 2018-08-28 LAB — BASIC METABOLIC PANEL
Anion gap: 12 (ref 5–15)
BUN: 12 mg/dL (ref 8–23)
CHLORIDE: 97 mmol/L — AB (ref 98–111)
CO2: 31 mmol/L (ref 22–32)
CREATININE: 0.8 mg/dL (ref 0.44–1.00)
Calcium: 8.8 mg/dL — ABNORMAL LOW (ref 8.9–10.3)
GFR calc Af Amer: >60 mL/min (ref >60)
GFR calc non Af Amer: >60 mL/min (ref >60)
Glucose, Bld: 123 mg/dL — ABNORMAL HIGH (ref 70–99)
Potassium: 4.8 mmol/L (ref 3.5–5.1)
Sodium: 140 mmol/L (ref 135–145)

## 2018-08-28 NOTE — Care Management Important Message (Signed)
Important Message  Patient Details  Name: Tomasa Dobransky MRN: [id number] Date of Birth: Jan 25, 1935   Medicare Important Message Given:  Yes    Kerin Salen 08/28/2018, 1:06 [place] Message  Patient Details  Name: Ameliarose Shark MRN: [id number] Date of Birth: [birth year]   Medicare Important Message Given:  Yes    Kerin Salen 08/28/2018, 1:05 PM

## 2018-08-28 NOTE — Progress Notes (Addendum)
PROGRESS NOTE    Pamela Mcguire  GEZ:[id number] DOB: 1935-07-18 DOA: 08/24/2018 PCP: Hoyt Koch, MD    Brief Narrative:  83 y.o. female, with past medical history significant for right lung adenocarcinoma status post radiation and chemotherapy in the past and now on surveillance and no active therapy, history of breast cancer status post right mastectomy and colon cancer in remission presenting today with shortness of breath worsening for the last few days.  Patient denies any fever or chills.  Patient has a history of atrial fibrillation on Eliquis and has a history of hypoxemia on chronic oxygen at home and reports associated chest tightness radiating to the back.  Patient reports sputum which is yellowish in color.  Patient denies any dizziness or loss of consciousness.  Patient denies any abdominal pain, diarrhea or urinary  Symptoms .  In the emergency room the chest x-ray shows large pleural effusion knowing that the patient has a history of pleural effusion status post thoracentesis on July of this year.  This has been recurrent and the patient is concerned.  Discussions about goals of care were done and the patient asked for the thoracentesis to be done first for therapeutic and diagnosis purposes  Assessment & Plan:   Active Problems:   Pleural effusion  1.  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 Historically fluid has returned transudative per Light's criteria -IR consulted for thoracentesis. Pt now s/p 1.4L out via US guided thoracentesis -Transudative based on protein and LDH analysis, likley related to acute CHF exacerbation. -continue to wean O2 as tolerated -Patient with continued LE swelling noted. -Will continue on IV lasix -PT consulted with recommendations for SNF, SW consulted -Recheck bmet in AM  2.  Right lung adeno CA status post  chemotherapy and radiation -Appears stable currently -Recommend follow up with Oncology when discharged  3.  History of anxiety/depression -continued on SSIR -At present, stable  4.  Chronic Atrial fibrillation, controlled rate on Eliquis, Cardizem and digoxin. -Rate controlled currently -Continued on eliquis  5.  Urinary tract infection -Pt was continued on rocephin -Patient reports symptoms of dysuria with UA suggestive of UTI -cont empiric abx with goal treatment of 3 days -Clinically improved -Urine culture was not obtained at time of admission and pt had already received abx, thus urine culture likely will not be useful  6.  History of colon cancer status post colon resection -Stable at this time  7.  History of breast cancer status post right mastectomy -currently stable  DVT prophylaxis: Therapeutic anticoagulation Code Status: No CPR Family Communication: Pt in room, family not at bedside Disposition Plan: Uncertain at this time  Consultants:   IR  Procedures:   Thoracentesis 9/28  Antimicrobials: Anti-infectives (From admission, onward)   Start     Dose/Rate Route Frequency Ordered Stop   08/24/18 2300  cefTRIAXone (ROCEPHIN) 1 g in sodium chloride 0.9 % 100 mL IVPB  Status:  Discontinued     1 g 200 mL/hr over 30 Minutes Intravenous Every 24 hours 08/24/18 2201 08/28/18 0906   08/24/18 1830  cephALEXin (KEFLEX) capsule 500 mg  Status:  Discontinued     500 mg Oral  Once 08/24/18 1823 08/24/18 2201      Subjective: Denies chest pain. Reports not yet feeling at baseline this AM  Objective: Vitals:   08/27/18 1412 08/27/18 2140 08/28/18 0443 08/28/18 1337  BP: 133/76 (!) 112/54  140/61 (!) 143/69  Pulse: 79 64 73 86  Resp: 20 18 18 18   Temp: 98.7 F (37.1 C) 98.1 F (36.7 C) 98.2 F (36.8 C) 97.9 F (36.6 C)  TempSrc: Oral Oral  Oral  SpO2: 98% 95% 94% 100%  Weight:      Height:        Intake/Output Summary (Last 24 hours) at 08/28/2018  1541 Last data filed at 08/28/2018 1500 Gross per 24 hour  Intake 938.46 ml  Output 1102 ml  Net -163.54 ml   Filed Weights   08/24/18 1558  Weight: 74.4 kg    Examination: General exam: Awake, laying in bed, in nad Respiratory system: Normal respiratory effort, no wheezing Cardiovascular system: regular rate, s1, s2 Gastrointestinal system: Soft, nondistended, positive BS Central nervous system: CN2-12 grossly intact, strength intact Extremities: Perfused, no clubbing, BLE nonpitting edema Skin: Normal skin turgor, no notable skin lesions seen Psychiatry: Mood normal // no visual hallucinations   Data Reviewed: I have personally reviewed following labs and imaging studies  CBC: Recent Labs  Lab 08/24/18 1626 08/25/18 0340 08/26/18 0436 08/27/18 0431  WBC 7.8 8.3 8.9 8.7  NEUTROABS 6.2  --   --   --   HGB 13.1 13.7 14.4 14.0  HCT 40.2 43.1 45.2 43.3  MCV 93.5 94.5 93.0 93.9  PLT 368 357 321 517   Basic Metabolic Panel: Recent Labs  Lab 08/24/18 1626 08/25/18 0340 08/27/18 0431 08/28/18 0430  NA 144 143 141 140  K 3.6 3.6 3.1* 4.8  CL 99 101 96* 97*  CO2 32 33* 33* 31  GLUCOSE 106* 114* 141* 123*  BUN 11 10 10 12   CREATININE 0.85 0.87 0.83 0.80  CALCIUM 9.3 9.1 8.8* 8.8*   GFR: Estimated Creatinine Clearance: 57.6 mL/min (by C-G formula based on SCr of 0.8 mg/dL). Liver Function Tests: Recent Labs  Lab 08/24/18 1626  AST 20  ALT 13  ALKPHOS 50  BILITOT 1.2  PROT 7.2  ALBUMIN 3.9   No results for input(s): LIPASE, AMYLASE in the last 168 hours. No results for input(s): AMMONIA in the last 168 hours. Coagulation Profile: No results for input(s): INR, PROTIME in the last 168 hours. Cardiac Enzymes: Recent Labs  Lab 08/24/18 1626  TROPONINI <0.03   BNP (last 3 results) No results for input(s): PROBNP in the last 8760 hours. HbA1C: No results for input(s): HGBA1C in the last 72 hours. CBG: No results for input(s): GLUCAP in the last 168  hours. Lipid Profile: No results for input(s): CHOL, HDL, LDLCALC, TRIG, CHOLHDL, LDLDIRECT in the last 72 hours. Thyroid Function Tests: No results for input(s): TSH, T4TOTAL, FREET4, T3FREE, THYROIDAB in the last 72 hours. Anemia Panel: No results for input(s): VITAMINB12, FOLATE, FERRITIN, TIBC, IRON, RETICCTPCT in the last 72 hours. Sepsis Labs: No results for input(s): PROCALCITON, LATICACIDVEN in the last 168 hours.  Recent Results (from the past 240 hour(s))  Culture, body fluid-bottle     Status: None (Preliminary result)   Collection Time: 08/25/18 10:13 AM  Result Value Ref Range Status   Specimen Description FLUID  Final   Special Requests BOTTLES DRAWN AEROBIC AND ANAEROBIC  Final   Culture   Final    NO GROWTH 3 DAYS Performed at [facility], [place] [address] [place], [place] [address]    Report Status PENDING  Incomplete  Gram stain     Status: None   Collection Time: 08/25/18 10:13 AM  Result Value Ref Range Status  Specimen Description FLUID  Final   Special Requests NONE  Final   Gram Stain   Final    MODERATE WBC PRESENT,BOTH PMN AND MONONUCLEAR NO ORGANISMS SEEN Performed at [facility], [address] [place], [place] [address]    Report Status 08/26/2018 FINAL  Final     Radiology Studies: No results found.  Scheduled Meds: . apixaban  5 mg Oral BID  . atorvastatin  40 mg Oral q1800  . digoxin  0.125 mg Oral Daily  . diltiazem  240 mg Oral Daily  . escitalopram  10 mg Oral Daily  . ferrous sulfate  325 mg Oral QPM  . fesoterodine  4 mg Oral Daily  . furosemide  40 mg Intravenous BID  . loratadine  10 mg Oral QHS  . mirtazapine  15 mg Oral QHS  . pantoprazole  40 mg Oral Daily   Continuous Infusions:    LOS: 4 days   Marylu Lund, MD [facility] Pager On Amion  If 7PM-7AM, please contact night-coverage 08/28/2018, 3:41 PM

## 2018-08-28 NOTE — Plan of Care (Signed)
  Problem: Health Behavior/Discharge Planning: Goal: Ability to manage health-related needs will improve Outcome: Progressing   Problem: Clinical Measurements: Goal: Ability to maintain clinical measurements within normal limits will improve Outcome: Progressing Goal: Will remain free from infection Outcome: Progressing Goal: Diagnostic test results will improve Outcome: Progressing Goal: Respiratory complications will improve Outcome: Progressing Goal: Cardiovascular complication will be avoided Outcome: Progressing   Problem: Coping: Goal: Level of anxiety will decrease Outcome: Progressing   Problem: Elimination: Goal: Will not experience complications related to bowel motility Outcome: Progressing Goal: Will not experience complications related to urinary retention Outcome: Progressing   Problem: Pain Managment: Goal: General experience of comfort will improve Outcome: Progressing   Problem: Safety: Goal: Ability to remain free from injury will improve Outcome: Progressing   Problem: Skin Integrity: Goal: Risk for impaired skin integrity will decrease Outcome: Progressing   

## 2018-08-29 ENCOUNTER — Inpatient Hospital Stay (HOSPITAL_COMMUNITY): Payer: Medicare HMO

## 2018-08-29 DIAGNOSIS — C3411 Malignant neoplasm of upper lobe, right bronchus or lung: Secondary | ICD-10-CM

## 2018-08-29 DIAGNOSIS — C182 Malignant neoplasm of ascending colon: Secondary | ICD-10-CM

## 2018-08-29 DIAGNOSIS — N3000 Acute cystitis without hematuria: Secondary | ICD-10-CM

## 2018-08-29 DIAGNOSIS — I10 Essential (primary) hypertension: Secondary | ICD-10-CM

## 2018-08-29 DIAGNOSIS — C50919 Malignant neoplasm of unspecified site of unspecified female breast: Secondary | ICD-10-CM

## 2018-08-29 DIAGNOSIS — J948 Other specified pleural conditions: Secondary | ICD-10-CM

## 2018-08-29 DIAGNOSIS — J9611 Chronic respiratory failure with hypoxia: Secondary | ICD-10-CM | POA: Diagnosis present

## 2018-08-29 DIAGNOSIS — I5033 Acute on chronic diastolic (congestive) heart failure: Secondary | ICD-10-CM | POA: Diagnosis present

## 2018-08-29 DIAGNOSIS — I480 Paroxysmal atrial fibrillation: Secondary | ICD-10-CM

## 2018-08-29 DIAGNOSIS — R0602 Shortness of breath: Secondary | ICD-10-CM

## 2018-08-29 DIAGNOSIS — G4733 Obstructive sleep apnea (adult) (pediatric): Secondary | ICD-10-CM

## 2018-08-29 LAB — BASIC METABOLIC PANEL
ANION GAP: 13 (ref 5–15)
BUN: 13 mg/dL (ref 8–23)
CALCIUM: 9.3 mg/dL (ref 8.9–10.3)
CO2: 34 mmol/L — ABNORMAL HIGH (ref 22–32)
CREATININE: 0.93 mg/dL (ref 0.44–1.00)
Chloride: 94 mmol/L — ABNORMAL LOW (ref 98–111)
GFR calc Af Amer: >60 mL/min (ref >60)
GFR, EST NON AFRICAN AMERICAN: 56 mL/min — AB (ref >60)
GLUCOSE: 131 mg/dL — AB (ref 70–99)
POTASSIUM: 4 mmol/L (ref 3.5–5.1)
SODIUM: 141 mmol/L (ref 135–145)

## 2018-08-29 LAB — TROPONIN I: Troponin I: <0.03 ng/mL (ref <0.03)

## 2018-08-29 MED ORDER — HYDROCODONE-ACETAMINOPHEN 5-325 MG PO TABS
1.0000 | ORAL_TABLET | ORAL | Status: DC | PRN
  Administered 2018-08-29 (×2): 1 via ORAL
  Filled 2018-08-29 (×3): qty 1

## 2018-08-29 MED ORDER — MORPHINE SULFATE (PF) 2 MG/ML IV SOLN
1.0000 mg | Freq: Once | INTRAVENOUS | Status: AC
  Administered 2018-08-29: 1 mg via INTRAVENOUS
  Filled 2018-08-29: qty 1

## 2018-08-29 MED ORDER — ACETAMINOPHEN 325 MG PO TABS: 650.0000 mg | ORAL_TABLET | Freq: Four times a day (QID) | ORAL | Status: DC | PRN

## 2018-08-29 MED ORDER — KETOROLAC TROMETHAMINE 30 MG/ML IJ SOLN
30.0000 mg | Freq: Once | INTRAMUSCULAR | Status: AC
  Administered 2018-08-30: 30 mg via INTRAVENOUS
  Filled 2018-08-29: qty 1

## 2018-08-29 MED ORDER — FUROSEMIDE 40 MG PO TABS: 40.0000 mg | ORAL_TABLET | Freq: Two times a day (BID) | ORAL | 11 refills | Status: DC

## 2018-08-29 NOTE — Progress Notes (Signed)
Physical Therapy Treatment Patient Details Name: Pamela Mcguire MRN: [id number] DOB: [birth year] Today's Date: 08/29/2018    History of Present Illness 83 y.o. 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  Pt fatigues quickly however and reports dyspnea with exertion.  Pt also reported mild dizziness with ambulation however declined need to sit down or rest.  Pt assisted back to bed.  Pt presents with limited endurance and generalized weakness and will likely need more assist upon d/c.  Recommend SNF if more assist is not available at [place] or family.   Follow Up Recommendations  SNF     Equipment Recommendations  None recommended by PT    Recommendations for Other Services       Precautions / Restrictions Precautions Precautions: Fall Precaution Comments: chronic oxygen Restrictions Weight Bearing Restrictions: No    Mobility  Bed Mobility Overal bed mobility: Needs Assistance Bed Mobility: Supine to Sit     Supine to sit: Supervision;HOB elevated        Transfers Overall transfer level: Needs assistance Equipment used: Rolling walker (2 wheeled) Transfers: Sit to/from Stand Sit to Stand: Min assist         General transfer comment: verbal cues for hand placement, assist to control descent  Ambulation/Gait Ambulation/Gait assistance: Min assist;Min guard Gait Distance (Feet): 80 Feet Assistive device: Rolling walker (2 wheeled) Gait Pattern/deviations: Step-through pattern;Decreased stride length;Narrow base of support      General Gait Details: verbal cues for sequence, RW positioning, distance limited by fatigue and dyspnea, Spo2 89-92% on 2L O2 [place]   Stairs             Wheelchair Mobility    Modified Rankin (Stroke Patients Only)       Balance                                            Cognition Arousal/Alertness: Awake/alert Behavior During Therapy: Flat affect Overall Cognitive Status: No family/caregiver present to determine baseline cognitive functioning                                 General Comments: hx dementia per chart      Exercises      General Comments        Pertinent Vitals/Pain Pain Assessment: No/denies pain    Home Living                      Prior Function            PT Goals (current goals can now be found in the care plan section) Progress towards PT goals: Progressing toward goals    Frequency    Min 2X/week      PT Plan Current plan remains appropriate    Co-evaluation              AM-PAC PT "6 Clicks" Daily Activity  Outcome Measure  Difficulty turning over in bed (including adjusting bedclothes, sheets and blankets)?: A Little Difficulty moving from lying on back to sitting on the side of the bed? : A Little Difficulty sitting down on and standing up from a chair with arms (e.g., wheelchair, bedside commode, etc,.)?: A Little Help needed moving to and from a bed to chair (including a wheelchair)?: A Little Help needed walking in hospital room?: A Little Help needed climbing 3-5 steps with a railing? : A Lot 6 Click Score: 17    End of Session Equipment Utilized During Treatment: Gait belt;Oxygen Activity Tolerance: Patient limited by fatigue Patient left: with call bell/phone within reach;in bed;with bed alarm set Nurse Communication: Mobility status PT Visit Diagnosis: Other abnormalities of gait and mobility (R26.89)     Time: 9371-6967 PT Time Calculation (min) (ACUTE ONLY):  14 min  Charges:  $Gait Training: 8-22 mins                     Carmelia Bake, PT, DPT Acute Rehabilitation Services Office: [phone] Pager: [phone]  Trena Platt 08/29/2018, 12:39 PM

## 2018-08-29 NOTE — Progress Notes (Signed)
PROGRESS NOTE    Pamela Mcguire  CNO:[id number] DOB: October 17, 1935 DOA: 08/24/2018 PCP: Hoyt Koch, MD    Brief Narrative:  83 y.o. female, with past medical history significant for chronic hypoxic respiratory failure on home O2, diastolic CHF, PAF, right lung adenocarcinoma status post radiation and chemotherapy in the past and now on surveillance and no active therapy, history of breast cancer status post right mastectomy and colon cancer in remission, presenting today with shortness of breath.  CXR shows right sided large pleural effusion ( patient had a history of pleural effusion status post thoracentesis on July of this year).  s/p thoracentesis with fluid analysis appears to be transudative, likely from acute on chronic diastolic CHF exacerbation. She's on iv lasix and has been diuresed well with preserved renal function. Pending repeated CXR. She's now on 2L O2 which is her baseline. PT/OT recommended SNF but she refused. Will order [facility] PT/OT   Assessment & Plan:   Principal Problem:   Acute on chronic diastolic CHF (congestive heart failure) (HCC) Active Problems:   Paroxysmal a-fib (HCC)   Essential hypertension   Severe obstructive sleep apnea   Adenocarcinoma of upper lobe of right lung   UTI (urinary tract infection)   Breast cancer (HCC)   Cancer of ascending colon s/p lap colectomy 02/08/2017   Pleural effusion transudative   Chronic respiratory failure with hypoxia (HCC)   SOB (shortness of breath)  1.  Large, recurrent right pleural effusion secondary to acute on choronic diastolic CHF exacerbation, transudative etiology - s/p 1.4L out via US guided thoracentesis -Transudative based on protein and LDH analysis, likley related to acute CHF exacerbation. -she's back on 2L O2 which is her baseline -pending repeat CXR today -no leg swelling -continue on IV lasix one more day, attempting to diurese her as much as she can -PT/OT recommended SNF but pt declined. Likely  will order [facility]   2.  Right lung adeno CA status post chemotherapy and radiation -Appears stable currently -Recommend follow up with Oncology when discharged  3.  History of anxiety/depression -continued on SSIR -At present, stable  4.  Chronic Atrial fibrillation, controlled rate on Eliquis, Cardizem and digoxin. -Rate controlled currently -Continued on eliquis  5.  Urinary tract infection -Pt was continued on rocephin -Patient reports symptoms of dysuria with UA suggestive of UTI -cont empiric abx with goal treatment of 3 days -Clinically improved -Urine culture was not obtained at time of admission and pt had already received abx, thus urine culture likely will not be useful  6.  History of colon cancer status post colon resection -Stable at this time  7.  History of breast cancer status post right mastectomy -currently stable  DVT prophylaxis: Therapeutic anticoagulation Code Status: No CPR Family Communication: Pt in room, family not at bedside Disposition Plan: hopefully tomorrow  Consultants:   IR  Procedures:   Thoracentesis 9/28  Antimicrobials: Anti-infectives (From admission, onward)   Start     Dose/Rate Route Frequency Ordered Stop   08/24/18 2300  cefTRIAXone (ROCEPHIN) 1 g in sodium chloride 0.9 % 100 mL IVPB  Status:  Discontinued     1 g 200 mL/hr over 30 Minutes Intravenous Every 24 hours 08/24/18 2201 08/28/18 0906   08/24/18 1830  cephALEXin (KEFLEX) capsule 500 mg  Status:  Discontinued     500 mg Oral  Once 08/24/18 1823 08/24/18 2201      Subjective: Denies chest pain. Stated she's not really feeling SOB but she talks, she's  labored in breath, more likely from anxiety. She stated she's walking with a walker in the hallway all the way to nurse station and did not feel significant SOB.   Objective: Vitals:   08/28/18 1337 08/28/18 2010 08/29/18 0651 08/29/18 1429  BP: (!) 143/69 137/73 (!) 141/73 (!) 111/42  Pulse: 86 64 66 66    Resp: 18 (!) 24 16 16   Temp: 97.9 F (36.6 C) 97.9 F (36.6 C) 98.3 F (36.8 C) 98.1 F (36.7 C)  TempSrc: Oral Oral Oral Oral  SpO2: 100% 94% 95% 97%  Weight:      Height:        Intake/Output Summary (Last 24 hours) at 08/29/2018 1624 Last data filed at 08/29/2018 1557 Gross per 24 hour  Intake 740 ml  Output 1900 ml  Net -1160 ml   Filed Weights   08/24/18 1558  Weight: 74.4 kg    Examination: General exam: Awake, laying in bed, in nad Respiratory system: Normal respiratory effort, no wheezing, bilateral equal breath sound Cardiovascular system: regular rate, s1, s2 Gastrointestinal system: Soft, nondistended, positive BS Central nervous system: CN2-12 grossly intact, strength intact Extremities: Perfused, no clubbing, no edema Skin: Normal skin turgor, no notable skin lesions seen Psychiatry: Mood normal // no visual hallucinations   Data Reviewed: I have personally reviewed following labs and imaging studies  CBC: Recent Labs  Lab 08/24/18 1626 08/25/18 0340 08/26/18 0436 08/27/18 0431  WBC 7.8 8.3 8.9 8.7  NEUTROABS 6.2  --   --   --   HGB 13.1 13.7 14.4 14.0  HCT 40.2 43.1 45.2 43.3  MCV 93.5 94.5 93.0 93.9  PLT 368 357 321 338   Basic Metabolic Panel: Recent Labs  Lab 08/24/18 1626 08/25/18 0340 08/27/18 0431 08/28/18 0430 08/29/18 0428  NA 144 143 141 140 141  K 3.6 3.6 3.1* 4.8 4.0  CL 99 101 96* 97* 94*  CO2 32 33* 33* 31 34*  GLUCOSE 106* 114* 141* 123* 131*  BUN 11 10 10 12 13   CREATININE 0.85 0.87 0.83 0.80 0.93  CALCIUM 9.3 9.1 8.8* 8.8* 9.3   GFR: Estimated Creatinine Clearance: 49.5 mL/min (by C-G formula based on SCr of 0.93 mg/dL). Liver Function Tests: Recent Labs  Lab 08/24/18 1626  AST 20  ALT 13  ALKPHOS 50  BILITOT 1.2  PROT 7.2  ALBUMIN 3.9   No results for input(s): LIPASE, AMYLASE in the last 168 hours. No results for input(s): AMMONIA in the last 168 hours. Coagulation Profile: No results for input(s): INR,  PROTIME in the last 168 hours. Cardiac Enzymes: Recent Labs  Lab 08/24/18 1626  TROPONINI <0.03   BNP (last 3 results) No results for input(s): PROBNP in the last 8760 hours. HbA1C: No results for input(s): HGBA1C in the last 72 hours. CBG: No results for input(s): GLUCAP in the last 168 hours. Lipid Profile: No results for input(s): CHOL, HDL, LDLCALC, TRIG, CHOLHDL, LDLDIRECT in the last 72 hours. Thyroid Function Tests: No results for input(s): TSH, T4TOTAL, FREET4, T3FREE, THYROIDAB in the last 72 hours. Anemia Panel: No results for input(s): VITAMINB12, FOLATE, FERRITIN, TIBC, IRON, RETICCTPCT in the last 72 hours. Sepsis Labs: No results for input(s): PROCALCITON, LATICACIDVEN in the last 168 hours.  Recent Results (from the past 240 hour(s))  Culture, body fluid-bottle     Status: None (Preliminary result)   Collection Time: 08/25/18 10:13 AM  Result Value Ref Range Status   Specimen Description FLUID  Final   Special  Requests BOTTLES DRAWN AEROBIC AND ANAEROBIC  Final   Culture   Final    NO GROWTH 4 DAYS Performed at [facility], [place] [address] [place], [place] [address]    Report Status PENDING  Incomplete  Gram stain     Status: None   Collection Time: 08/25/18 10:13 AM  Result Value Ref Range Status   Specimen Description FLUID  Final   Special Requests NONE  Final   Gram Stain   Final    MODERATE WBC PRESENT,BOTH PMN AND MONONUCLEAR NO ORGANISMS SEEN Performed at [facility], [address] [place],  [address]    Report Status 08/26/2018 FINAL  Final     Radiology Studies: No results found.  Scheduled Meds: . apixaban  5 mg Oral BID  . atorvastatin  40 mg Oral q1800  . digoxin  0.125 mg Oral Daily  . diltiazem  240 mg Oral Daily  . escitalopram  10 mg Oral Daily  . ferrous sulfate  325 mg Oral QPM  . fesoterodine  4 mg Oral Daily  . furosemide  40 mg Intravenous BID  . loratadine  10 mg Oral QHS  . mirtazapine  15 mg Oral  QHS  . pantoprazole  40 mg Oral Daily   Continuous Infusions:    LOS: 5 days   Paticia Stack, MD [facility] Pager On Amion  If 7PM-7AM, please contact night-coverage 08/29/2018, 4:24 PM

## 2018-08-30 LAB — BASIC METABOLIC PANEL
ANION GAP: 13 (ref 5–15)
BUN: 19 mg/dL (ref 8–23)
CALCIUM: 9.4 mg/dL (ref 8.9–10.3)
CO2: 34 mmol/L — ABNORMAL HIGH (ref 22–32)
Chloride: 93 mmol/L — ABNORMAL LOW (ref 98–111)
Creatinine, Ser: 1.11 mg/dL — ABNORMAL HIGH (ref 0.44–1.00)
GFR, EST AFRICAN AMERICAN: 53 mL/min — AB (ref >60)
GFR, EST NON AFRICAN AMERICAN: 45 mL/min — AB (ref >60)
Glucose, Bld: 130 mg/dL — ABNORMAL HIGH (ref 70–99)
Potassium: 3.7 mmol/L (ref 3.5–5.1)
Sodium: 140 mmol/L (ref 135–145)

## 2018-08-30 LAB — CULTURE, BODY FLUID-BOTTLE: CULTURE: NO GROWTH

## 2018-08-30 MED ORDER — FUROSEMIDE 40 MG PO TABS: 40.0000 mg | ORAL_TABLET | Freq: Two times a day (BID) | ORAL | Status: DC

## 2018-08-30 NOTE — Progress Notes (Signed)
After several interventions during the night pt finally having relief after being given IV Toradol. Pt now describes the pain 6/10 and dull. On call provider updated. Will continue to monitor closely.

## 2018-08-30 NOTE — Care Management Note (Signed)
Case Management Note  Patient Details  Name: Pamela Mcguire MRN: [id number] Date of Birth: 1935/09/02  Subjective/Objective: PT recc SNF-spoke to dtr Grover Canavan they decline SNF. Per patient permission spoke to dtrs.Denise--says patient will stay with her @ [place] [id number] does not want [facility] even though attending has ordered. Patient has rw,& home 02 currently @ 2l at [facility]. Will need travel tank-[facility] rep Santiago Glad will deliver to rm prior d/c. Patient agree to d/c plan.MD/Nurse updated.                  Action/Plan:d/c home.   Expected Discharge Date:  08/30/18               Expected Discharge Plan:  Home/Self Care  In-House Referral:     Discharge planning Services  CM Consult  Post Acute Care Choice:    Choice offered to:  Adult Children  DME Arranged:    DME Agency:     HH Arranged:  Patient Refused HH, Refused SNF [place] Agency:     Status of Service:  Completed, signed off  If discussed at [place] of Stay Meetings, dates discussed:    Additional Comments:  Dessa Phi, RN 08/30/2018, 12:42 PM

## 2018-08-30 NOTE — Discharge Summary (Signed)
Physician Discharge Summary  Elica Almas DVV:[id number] DOB: 1935-04-10 DOA: 08/24/2018  PCP: Hoyt Koch, MD  Admit date: 08/24/2018 Discharge date: 08/30/2018  Admitted From: home Disposition:  home  Recommendations for Outpatient Follow-up:  1. Follow up with PCP in 1-2 weeks 2. Follow up with CHF clinic and cardiology in 1 week and repeat BMP 3. Pt's anxiety/depression is not controlled and need medication adjustment   Home Health:yes Equipment/Devices:back to 2L PRN O2 at home Discharge Condition: stable CODE STATUS:DNR Diet recommendation: Heart Healthy   Brief/Interim Summary: 83 y.o.female,with past medical history significant for chronic hypoxic respiratory failure on home O2, diastolic CHF, PAF, right lung adenocarcinoma status post radiation and chemotherapy in the past and now on surveillance and no active therapy, history of breast cancer status post right mastectomy and colon cancer in remission, presenting with shortness of breath.   CXR shows right sided large pleural effusion ( patient had a history of pleural effusion status post thoracentesis on July of this year). s/p thoracentesis with fluid analysis appears to be transudative, likely from acute on chronic diastolic CHF exacerbation. She's on iv lasix and has been diuresed well and has reached mild azotemia on discharge, Cr 1.11. Repeated CXR showed resolved pleural effusion. She's now on 2L O2 which is her baseline. She's switched to po lasix 40 mg bid.   PT/OT recommended SNF but she refused. She will be discharged back to independent living with [facility]. I have updated her daughter [name] upon discharge. She will follow up at CHF clinic and cardiology in 1 week.   Pt's anxiety and depression is not controlled. She personally told attending she's depressed and constantly broke into tears. With CHF well diuresed, she often showed conversational SOB but more due to anxiety as she's not hypoxic and breath sounds  are symmetric and good bilaterally. This needs to be addressed by PCP or refer to psychiatry.   Pt had dysuria on admission and was treated with 3 days iv Rocephin. No urine culture ordered on admission. She no longer has dysuria symptoms.   Discharge Diagnoses:  Principal Problem:   Acute on chronic diastolic CHF (congestive heart failure) (HCC) Active Problems:   Paroxysmal a-fib (HCC)   Essential hypertension   Severe obstructive sleep apnea   Adenocarcinoma of upper lobe of right lung   Breast cancer (HCC)   Cancer of ascending colon s/p lap colectomy 02/08/2017   Pleural effusion transudative   Chronic respiratory failure with hypoxia ([place]), 2L home O2   SOB (shortness of breath)    Discharge Instructions  Discharge Instructions    (HEART FAILURE PATIENTS) Call MD:  Anytime you have any of the following symptoms: 1) 3 pound weight gain in 24 hours or 5 pounds in 1 week 2) shortness of breath, with or without a dry hacking cough 3) swelling in the hands, feet or stomach 4) if you have to sleep on extra pillows at night in order to breathe.   Complete by:  As directed    AMB referral to CHF clinic   Complete by:  As directed    AMB referral to cardiac rehabilitation - Phase II   Complete by:  As directed    Diagnosis:  Heart Failure (see criteria below if ordering Phase II)   Heart Failure Type:  Chronic Diastolic   Avoid straining   Complete by:  As directed    Diet - low sodium heart healthy   Complete by:  As directed    Heart  Failure patients record your daily weight using the same scale at the same time of day   Complete by:  As directed    Increase activity slowly   Complete by:  As directed    STOP any activity that causes chest pain, shortness of breath, dizziness, sweating, or exessive weakness   Complete by:  As directed      Allergies as of 08/30/2018   No Known Allergies     Medication List    STOP taking these medications   ADVIL PM PO   LORazepam 2  MG tablet Commonly known as:  ATIVAN   oxyCODONE 5 MG immediate release tablet Commonly known as:  Oxy IR/ROXICODONE     TAKE these medications   albuterol 108 (90 Base) MCG/ACT inhaler Commonly known as:  PROVENTIL HFA;VENTOLIN HFA Inhale 1 puff into the lungs every 6 (six) hours as needed for wheezing or shortness of breath. Reported on 06/01/2016   atorvastatin 40 MG tablet Commonly known as:  LIPITOR TAKE 1 TABLET(40 MG) BY MOUTH DAILY   DIGOX 0.125 MG tablet Generic drug:  digoxin TAKE 1 TABLET(0.125 MG) BY MOUTH DAILY What changed:  Another medication with the same name was removed. Continue taking this medication, and follow the directions you see here.   diltiazem 240 MG 24 hr capsule Commonly known as:  CARDIZEM CD Take 1 capsule (240 mg total) by mouth daily.   ELIQUIS 5 MG Tabs tablet Generic drug:  apixaban TAKE 1 TABLET(5 MG) BY MOUTH TWICE DAILY What changed:  See the new instructions.   escitalopram 10 MG tablet Commonly known as:  LEXAPRO TAKE 1 TABLET(10 MG) BY MOUTH DAILY. FOLLOW-UP APPOINTMENT IS DUE What changed:  See the new instructions.   ferrous sulfate 325 (65 FE) MG tablet Take 325 mg by mouth every evening.   furosemide 40 MG tablet Commonly known as:  LASIX Take 1 tablet (40 mg total) by mouth 2 (two) times daily. What changed:    medication strength  how much to take  when to take this   ipratropium-albuterol 0.5-2.5 (3) MG/3ML Soln Commonly known as:  DUONEB Take 3 mLs by nebulization 4 (four) times daily as needed.   loratadine 10 MG tablet Commonly known as:  CLARITIN Take 1 tablet (10 mg total) by mouth at bedtime.   mirtazapine 15 MG tablet Commonly known as:  REMERON TAKE 1 TABLET(15 MG) BY MOUTH AT BEDTIME   multivitamin with minerals tablet Take 1 tablet by mouth daily. gummie   NUTRITIONAL SUPPLEMENT Liqd Take 120 mLs by mouth 3 (three) times daily. MedPass   OXYGEN Inhale 2 L into the lungs as needed.    pantoprazole 40 MG tablet Commonly known as:  PROTONIX TAKE 1 TABLET(40 MG) BY MOUTH DAILY What changed:  See the new instructions.   tolterodine 4 MG 24 hr capsule Commonly known as:  DETROL LA Take 1 capsule (4 mg total) by mouth daily.      Follow-up Information    Hoyt Koch, MD Follow up in 2 week(s).   Specialty:  Internal Medicine Contact information: [place] [address] [phone]        Pixie Casino, MD .   Specialty:  Cardiology Contact information: [address] [place] [address] [phone]          No Known Allergies  Consultations:  none   Procedures/Studies: Dg Chest 1 View  Result Date: 08/25/2018 CLINICAL DATA:  Central chest tightness.  Status post right thoracentesis. EXAM: CHEST  1 VIEW COMPARISON:  08/25/2018 FINDINGS: Interval right thoracentesis. Small partially loculated right pleural effusion which is significantly decreased in size compared with the prior exam. No left pleural effusion. Mild bilateral interstitial thickening. Right basilar airspace disease likely reflecting atelectasis. No pneumothorax. Stable cardiomegaly. Thoracic aortic atherosclerosis. No acute osseous abnormality. IMPRESSION: 1. Interval right thoracentesis with decrease in the right pleural effusion. Small persistent partially loculated right pleural effusion. No pneumothorax. Electronically Signed   By: Kathreen Devoid   On: 08/25/2018 11:20   Ct Angio Chest Pe W Or Wo Contrast  Result Date: 08/24/2018 CLINICAL DATA:  Shortness of breath. History of non-small cell lung cancer. EXAM: CT ANGIOGRAPHY CHEST WITH CONTRAST TECHNIQUE: Multidetector CT imaging of the chest was performed using the standard protocol during bolus administration of intravenous contrast. Multiplanar CT image reconstructions and MIPs were obtained to evaluate the vascular anatomy. CONTRAST:  158mL ISOVUE-370 IOPAMIDOL (ISOVUE-370) INJECTION 76%  COMPARISON:  Chest x-ray from today. CTA of the chest January 02, 2018. FINDINGS: Cardiovascular: Cardiomegaly. Coronary artery calcifications. Atherosclerotic change in the nonaneurysmal aorta. No aortic dissection. Evaluation of more peripheral pulmonary arteries is difficult due to respiratory motion. Evaluation of right-sided pulmonary arteries is limited due to significant atelectasis due to the large effusion. Within this limitation, no pulmonary emboli identified. Mediastinum/Nodes: Apparent right mastectomy. Visualize portions of the left breast are normal. No axillary or cervical adenopathy identified. Mild nodularity in the right thyroid is unchanged, requiring no follow-up. A right pretracheal node on series 8, image 100 measures 11 mm today, unchanged. The cardiomediastinal silhouette is shifted to the right due the large right-sided pleural effusion. It is difficult to compare the remainder of the lymph nodes on today's study to the previous but there has been no other definitive change. No left-sided pleural effusion. No definitive pericardial effusion. Lungs/Pleura: The central airways are patent. No pneumothorax. There is a large right-sided pleural effusion which is larger in the interval. The amount of aerated lung has decreased. The previously identified right lower lobe nodules not visualized on this study with this portion of lung is not aerated. Mild nodularity anteriorly in the right lung measuring 7.7 mm on series 9, image 106 is difficult to compare to previous studies due to the large effusion. Post therapeutic changes in the right perihilar region. Probable mild pulmonary edema. No other new nodules or masses identified in the lungs. Upper Abdomen: No acute abnormality. Musculoskeletal: Compression fracture of an upper lumbar vertebral body, unchanged. No other acute bony changes. Review of the MIP images confirms the above findings. IMPRESSION: 1. No pulmonary emboli identified. 2. There  is a large right pleural effusion which is much larger in the interval. The amount of aerated lung on the right has decreased. 3. Cardiomegaly and mild pulmonary edema. 4. The previously identified right-sided nodule is obscured by atelectatic lung today. 5. 7.7 mm nodularity anteriorly in the right lung could represent focal atelectasis or a new nodule. Recommend short-term follow-up to in further evaluate after the effusion has resolved. 6. Post therapeutic changes in the right perihilar region. 7. Atherosclerotic changes in the aorta. Coronary artery calcifications. 8. Stable compression fracture of an upper lumbar vertebral body. Aortic Atherosclerosis (ICD10-I70.0). Electronically Signed   By: Dorise Bullion III M.D   On: 08/24/2018 17:58   Dg Chest Port 1 View  Result Date: 08/29/2018 CLINICAL DATA:  History of pleural effusion, follow-up, chronic respiratory failure, history of right lung carcinoma with radiation and  chemotherapy EXAM: PORTABLE CHEST 1 VIEW COMPARISON:  Portable chest x-ray of 08/25/2018 and CT chest of 08/24/2017 FINDINGS: Much of the previous right pleural effusion appears to have been evacuated. There is pleuro diaphragmatic tenting at the right lung base from prior surgery. The left lung appears clear. Mediastinal and hilar contours are unremarkable and the heart is mildly enlarged and stable. Surgical clips overlie the right axilla. IMPRESSION: 1. Pleuro diaphragmatic tenting at the right lung base with volume loss on the right postoperatively. 2. The left lung is clear. Electronically Signed   By: Ivar Drape M.D.   On: 08/29/2018 16:38   Dg Chest Port 1 View  Result Date: 08/25/2018 CLINICAL DATA:  83 year old female with hypoxemia. EXAM: PORTABLE CHEST 1 VIEW COMPARISON:  Chest radiograph dated 08/24/2018 FINDINGS: There has been interval increase in the size of the right-sided pleural effusion since the prior radiograph with increase in associated compressive atelectasis of  the right lung. Diminished right lung aeration. The left lung is clear. Stable cardiac silhouette. Atherosclerotic calcification of the aorta. Right chest wall surgical clips. No acute osseous pathology. IMPRESSION: Increase in the size of the right-sided pleural effusion and right lung atelectasis. Electronically Signed   By: Anner Crete M.D.   On: 08/25/2018 04:49   Dg Chest Port 1 View  Result Date: 08/24/2018 CLINICAL DATA:  Chest pain and shortness of breath. Breast cancer. Non-small-cell lung cancer. Atrial fibrillation. EXAM: PORTABLE CHEST 1 VIEW COMPARISON:  Chest x-ray.  06/05/2018. CT chest earlier today. FINDINGS: Cardiomegaly. Calcified tortuous aorta. Large RIGHT pleural effusion, uncertain etiology. RIGHT hilar clips. RIGHT mastectomy. Osteopenia. Interstitial prominence of the RIGHT lung, uncertain significance. IMPRESSION: Large RIGHT effusion is the dominant abnormality. Marked worsening aeration from prior chest radiograph. Consider RIGHT-sided thoracentesis as a diagnostic and therapeutic maneuver. Electronically Signed   By: Staci Righter M.D.   On: 08/24/2018 18:04   US Thoracentesis Asp Pleural Space W/img Guide  Result Date: 08/25/2018 INDICATION: Patient with right pleural effusion. Request is made for diagnostic and therapeutic right thoracentesis. EXAM: ULTRASOUND GUIDED DIAGNOSTIC AND THERAPEUTIC RIGHT THORACENTESIS MEDICATIONS: 10 mL 1% lidocaine COMPLICATIONS: None immediate. PROCEDURE: An ultrasound guided thoracentesis was thoroughly discussed with the patient and questions answered. The benefits, risks, alternatives and complications were also discussed. The patient understands and wishes to proceed with the procedure. Written consent was obtained. Ultrasound was performed to localize and mark an adequate pocket of fluid in the right chest. The area was then prepped and draped in the normal sterile fashion. 1% Lidocaine was used for local anesthesia. Under ultrasound  guidance a 6 Fr Safe-T-Centesis catheter was introduced. Thoracentesis was performed. The catheter was removed and a dressing applied. FINDINGS: A total of approximately 1.4 liters of yellow fluid was removed. Samples were sent to the laboratory as requested by the clinical team. IMPRESSION: Successful ultrasound guided diagnostic and therapeutic right thoracentesis yielding 1.4 liters of pleural fluid. Read by: Brynda Greathouse PA-C Electronically Signed   By: Marybelle Killings M.D.   On: 08/25/2018 11:32       Subjective:   Discharge Exam: Vitals:   08/30/18 0528 08/30/18 0803  BP: (!) 109/56 127/64  Pulse: (!) 59 63  Resp: 18 16  Temp: 97.8 F (36.6 C) 97.9 F (36.6 C)  SpO2: (!) 88% 95%   Vitals:   08/29/18 2020 08/29/18 2048 08/30/18 0528 08/30/18 0803  BP: 136/85  (!) 109/56 127/64  Pulse: 86  (!) 59 63  Resp: 16  18 16  Temp:  97.6 F (36.4 C) 97.8 F (36.6 C) 97.9 F (36.6 C)  TempSrc:  Oral Oral Oral  SpO2: 95%  (!) 88% 95%  Weight:      Height:        Examination: General exam: Awake, laying in bed, in nad Respiratory system: Normal respiratory effort, no wheezing, bilateral equal breath sound Cardiovascular system: regular rate, s1, s2 Gastrointestinal system: Soft, nondistended, positive BS Central nervous system: CN2-12 grossly intact, strength intact Extremities: Perfused, no clubbing, no edema Skin: Normal skin turgor, no notable skin lesions seen Psychiatry: depressed mood   The results of significant diagnostics from this hospitalization (including imaging, microbiology, ancillary and laboratory) are listed below for reference.     Microbiology: Recent Results (from the past 240 hour(s))  Culture, body fluid-bottle     Status: None   Collection Time: 08/25/18 10:13 AM  Result Value Ref Range Status   Specimen Description FLUID  Final   Special Requests BOTTLES DRAWN AEROBIC AND ANAEROBIC  Final   Culture   Final    NO GROWTH 5 DAYS Performed at  [facility], [place] [address], [place], [place] [address]    Report Status 08/30/2018 FINAL  Final  Gram stain     Status: None   Collection Time: 08/25/18 10:13 AM  Result Value Ref Range Status   Specimen Description FLUID  Final   Special Requests NONE  Final   Gram Stain   Final    MODERATE WBC PRESENT,BOTH PMN AND MONONUCLEAR NO ORGANISMS SEEN Performed at [facility], [place] [address], [place], [place] [address]    Report Status 08/26/2018 FINAL  Final     Labs: BNP (last 3 results) Recent Labs    08/24/18 1626  BNP 627.0*   Basic Metabolic Panel: Recent Labs  Lab 08/25/18 0340 08/27/18 0431 08/28/18 0430 08/29/18 0428 08/30/18 0502  NA 143 141 140 141 140  K 3.6 3.1* 4.8 4.0 3.7  CL 101 96* 97* 94* 93*  CO2 33* 33* 31 34* 34*  GLUCOSE 114* 141* 123* 131* 130*  BUN 10 10 12 13 19   CREATININE 0.87 0.83 0.80 0.93 1.11*  CALCIUM 9.1 8.8* 8.8* 9.3 9.4   Liver Function Tests: Recent Labs  Lab 08/24/18 1626  AST 20  ALT 13  ALKPHOS 50  BILITOT 1.2  PROT 7.2  ALBUMIN 3.9   No results for input(s): LIPASE, AMYLASE in the last 168 hours. No results for input(s): AMMONIA in the last 168 hours. CBC: Recent Labs  Lab 08/24/18 1626 08/25/18 0340 08/26/18 0436 08/27/18 0431  WBC 7.8 8.3 8.9 8.7  NEUTROABS 6.2  --   --   --   HGB 13.1 13.7 14.4 14.0  HCT 40.2 43.1 45.2 43.3  MCV 93.5 94.5 93.0 93.9  PLT 368 357 321 359   Cardiac Enzymes: Recent Labs  Lab 08/24/18 1626 08/29/18 2103  TROPONINI <0.03 <0.03   BNP: Invalid input(s): POCBNP CBG: No results for input(s): GLUCAP in the last 168 hours. D-Dimer No results for input(s): DDIMER in the last 72 hours. Hgb A1c No results for input(s): HGBA1C in the last 72 hours. Lipid Profile No results for input(s): CHOL, HDL, LDLCALC, TRIG, CHOLHDL, LDLDIRECT in the last 72 hours. Thyroid function studies No results for input(s): TSH, T4TOTAL, T3FREE, THYROIDAB in the last 72  hours.  Invalid input(s): FREET3 Anemia work up No results for input(s): VITAMINB12, FOLATE, FERRITIN, TIBC, IRON, RETICCTPCT in the last 72 hours.  Urinalysis    Component Value Date/Time   COLORURINE YELLOW 08/24/2018 [place] 08/24/2018 1638   LABSPEC 1.006 08/24/2018 1638   LABSPEC 1.010 06/02/2015 1706   PHURINE 7.0 08/24/2018 1638   GLUCOSEU NEGATIVE 08/24/2018 1638   GLUCOSEU NEGATIVE 07/10/2017 1633   GLUCOSEU Negative 06/02/2015 1706   HGBUR NEGATIVE 08/24/2018 1638   BILIRUBINUR NEGATIVE 08/24/2018 1638   BILIRUBINUR 3+      4mg /dl 09/08/2016 1133   KETONESUR NEGATIVE 08/24/2018 1638   PROTEINUR NEGATIVE 08/24/2018 1638   UROBILINOGEN 0.2 07/10/2017 1633   UROBILINOGEN 0.2 06/02/2015 1706   NITRITE NEGATIVE 08/24/2018 1638   LEUKOCYTESUR LARGE (A) 08/24/2018 1638   LEUKOCYTESUR Moderate 06/02/2015 1706   Sepsis Labs Invalid input(s): PROCALCITONIN,  WBC,  LACTICIDVEN Microbiology Recent Results (from the past 240 hour(s))  Culture, body fluid-bottle     Status: None   Collection Time: 08/25/18 10:13 AM  Result Value Ref Range Status   Specimen Description FLUID  Final   Special Requests BOTTLES DRAWN AEROBIC AND ANAEROBIC  Final   Culture   Final    NO GROWTH 5 DAYS Performed at [facility], [place] [address], [place], [place] [address]    Report Status 08/30/2018 FINAL  Final  Gram stain     Status: None   Collection Time: 08/25/18 10:13 AM  Result Value Ref Range Status   Specimen Description FLUID  Final   Special Requests NONE  Final   Gram Stain   Final    MODERATE WBC PRESENT,BOTH PMN AND MONONUCLEAR NO ORGANISMS SEEN Performed at [facility], [place] [address], [place], [place] [address]    Report Status 08/26/2018 FINAL  Final     Time coordinating discharge: 42 minutes  SIGNED:   Paticia Stack, MD  [facility] 08/30/2018, 12:26 PM Pager   If 7PM-7AM, please contact night-coverage [email or web address] Password  [email or web address]

## 2018-08-30 NOTE — Progress Notes (Addendum)
Pt complaining of new onset of pain under left breast, 8/10, with tightness. Pt presents with tachypnea and nausea. BP 168/89 HR 83. On call provider made aware. New orders placed for STAT EKG, cycle troponin, one time order for morphine, and telemetry. Will continue to monitor closely.

## 2018-08-31 ENCOUNTER — Telehealth: Payer: Self-pay | Admitting: *Deleted

## 2018-08-31 NOTE — Telephone Encounter (Signed)
Transition Care Management Follow-up Telephone Call   Date discharged? 08/30/18   How have you been since you were released from the hospital? Pt daughter states mom is doing ok she was resting when I called   Do you understand why you were in the hospital? YES   Do you understand the discharge instructions? YES   Where were you discharged to? Home   Items Reviewed:  Medications reviewed: YES  Allergies reviewed: YES  Dietary changes reviewed: YES, heart healthy  Referrals reviewed: No referral needed   Functional Questionnaire:   Activities of Daily Living (ADLs):   She states she are independent in the following: bathing and hygiene, feeding, continence, grooming, toileting and dressing States they require assistance with the following: ambulation   Any transportation issues/concerns?: NO   Any patient concerns? NO   Confirmed importance and date/time of follow-up visits scheduled YES, appt 09/12/18  Provider Appointment booked with Dr. Sharlet Salina  Confirmed with patient if condition begins to worsen call PCP or go to the ER.  Patient was given the office number and encouraged to call back with question or concerns.  : YES

## 2018-09-08 ENCOUNTER — Other Ambulatory Visit: Payer: Self-pay | Admitting: Internal Medicine

## 2018-09-11 ENCOUNTER — Other Ambulatory Visit: Payer: Self-pay | Admitting: *Deleted

## 2018-09-11 MED ORDER — DILTIAZEM HCL ER COATED BEADS 240 MG PO CP24: 240.0000 mg | ORAL_CAPSULE | Freq: Every day | ORAL | 1 refills | Status: DC

## 2018-09-12 ENCOUNTER — Other Ambulatory Visit (INDEPENDENT_AMBULATORY_CARE_PROVIDER_SITE_OTHER): Payer: Medicare HMO

## 2018-09-12 ENCOUNTER — Encounter: Payer: Self-pay | Admitting: Internal Medicine

## 2018-09-12 ENCOUNTER — Ambulatory Visit (INDEPENDENT_AMBULATORY_CARE_PROVIDER_SITE_OTHER): Payer: Medicare HMO | Admitting: Internal Medicine

## 2018-09-12 ENCOUNTER — Ambulatory Visit: Payer: Medicare HMO | Admitting: Internal Medicine

## 2018-09-12 VITALS — BP 108/56 | HR 77 | Ht 65.0 in | Wt 153.0 lb

## 2018-09-12 VITALS — BP 120/70 | HR 77 | Temp 97.7°F | Ht 66.5 in | Wt 153.0 lb

## 2018-09-12 DIAGNOSIS — J9611 Chronic respiratory failure with hypoxia: Secondary | ICD-10-CM

## 2018-09-12 DIAGNOSIS — I5033 Acute on chronic diastolic (congestive) heart failure: Secondary | ICD-10-CM

## 2018-09-12 DIAGNOSIS — R0602 Shortness of breath: Secondary | ICD-10-CM | POA: Diagnosis not present

## 2018-09-12 DIAGNOSIS — I4819 Other persistent atrial fibrillation: Secondary | ICD-10-CM | POA: Diagnosis not present

## 2018-09-12 DIAGNOSIS — J90 Pleural effusion, not elsewhere classified: Secondary | ICD-10-CM

## 2018-09-12 DIAGNOSIS — F322 Major depressive disorder, single episode, severe without psychotic features: Secondary | ICD-10-CM

## 2018-09-12 DIAGNOSIS — J9612 Chronic respiratory failure with hypercapnia: Secondary | ICD-10-CM

## 2018-09-12 LAB — CBC
HCT: 40.9 % (ref 36.0–46.0)
Hemoglobin: 13.6 g/dL (ref 12.0–15.0)
MCHC: 33.2 g/dL (ref 30.0–36.0)
MCV: 90.6 fl (ref 78.0–100.0)
Platelets: 355 10*3/uL (ref 150.0–400.0)
RBC: 4.52 Mil/uL (ref 3.87–5.11)
RDW: 14.4 % (ref 11.5–15.5)
WBC: 8.9 10*3/uL (ref 4.0–10.5)

## 2018-09-12 LAB — COMPREHENSIVE METABOLIC PANEL
ALK PHOS: 54 U/L (ref 39–117)
ALT: 15 U/L (ref 0–35)
AST: 14 U/L (ref 0–37)
Albumin: 3.8 g/dL (ref 3.5–5.2)
BILIRUBIN TOTAL: 0.7 mg/dL (ref 0.2–1.2)
BUN: 13 mg/dL (ref 6–23)
CO2: 32 mEq/L (ref 19–32)
Calcium: 9.4 mg/dL (ref 8.4–10.5)
Chloride: 97 mEq/L (ref 96–112)
Creatinine, Ser: 1.05 mg/dL (ref 0.40–1.20)
GFR: 53.37 mL/min — ABNORMAL LOW (ref >60.00)
GLUCOSE: 154 mg/dL — AB (ref 70–99)
Potassium: 3.4 mEq/L — ABNORMAL LOW (ref 3.5–5.1)
SODIUM: 141 meq/L (ref 135–145)
TOTAL PROTEIN: 7 g/dL (ref 6.0–8.3)

## 2018-09-12 LAB — BRAIN NATRIURETIC PEPTIDE: PRO B NATRI PEPTIDE: 175 pg/mL — AB (ref 0.0–100.0)

## 2018-09-12 MED ORDER — SERTRALINE HCL 25 MG PO TABS: 25.0000 mg | ORAL_TABLET | Freq: Every day | ORAL | 3 refills | Status: AC

## 2018-09-12 NOTE — Patient Instructions (Signed)
Medication Instructions:  Continue current medications If you need a refill on your cardiac medications before your next appointment, please call your pharmacy.    Follow-Up: At [facility], you and your health needs are our priority.  As part of our continuing mission to provide you with exceptional heart care, we have created designated Provider Care Teams.  These Care Teams include your primary Cardiologist (physician) and Advanced Practice Providers (APPs -  Physician Assistants and Nurse Practitioners) who all work together to provide you with the care you need, when you need it. You will need a follow up appointment in 3 months.  You may see Pixie Casino, MD or one of the following Advanced Practice Providers on your designated Care Team: [place], [place] . Fabian Sharp, PA-C  Any Other Special Instructions Will Be Listed Below (If Applicable).

## 2018-09-12 NOTE — Progress Notes (Signed)
   Subjective:    Patient ID: Pamela Mcguire, female    DOB: 1935-04-29, 83 y.o.   MRN: [id number]  HPI The patient is an 83 YO female coming in for hospital follow up (in for SOB and acute heart failure with large effusion, diuresed and breathing did improve some, severe anxiety while in the hospital and recommendation to adjust medication but this was not addressed in the hospital). She is with her daughter today and they both help to give history. She is still SOB. She is anxious and depressed. This is affecting her a lot. She is not sure what medications she is taking. She had been on lexapro and remeron. They could not tell if when we started the lexapro or changed the dose she got any benefit. She is on oxygen constantly at home which seems to help with breathing some but did not bring it today. She is losing weight due to not eating. She states she doesn't want to. She denies getting too SOB while eating to eat. She denies chest pains or tightness. She denies abdominal pain. Some bowel changes since colectomy which are stable. She is walking a little around the house but poor motivation and energy.   PMH, [facility], social history reviewed and updated.   Review of Systems  Constitutional: Positive for activity change, appetite change, fatigue and unexpected weight change. Negative for chills and fever.  HENT: Negative.   Eyes: Negative.   Respiratory: Positive for chest tightness and shortness of breath. Negative for cough.   Cardiovascular: Negative for chest pain, palpitations and leg swelling.  Gastrointestinal: Negative for abdominal distention, abdominal pain, anal bleeding, blood in stool, constipation, diarrhea, nausea, rectal pain and vomiting.  Musculoskeletal: Negative.   Skin: Negative.   Neurological: Negative.   Psychiatric/Behavioral: Positive for decreased concentration, dysphoric mood and sleep disturbance. Negative for self-injury and suicidal ideas. The patient is nervous/anxious.         Objective:   Physical Exam  Constitutional: She is oriented to person, place, and time. She appears well-developed and well-nourished.  Appears frail  HENT:  Head: Normocephalic and atraumatic.  Eyes: EOM are normal.  Neck: Normal range of motion.  Cardiovascular: Normal rate and regular rhythm.  Pulmonary/Chest: Effort normal. No respiratory distress. She has no wheezes. She has no rales.  No overt rales or crackles on exam  Abdominal: Soft. Bowel sounds are normal. She exhibits no distension. There is no tenderness. There is no rebound.  Musculoskeletal: She exhibits no edema.  Neurological: She is alert and oriented to person, place, and time. Coordination normal.  Skin: Skin is warm and dry.  Psychiatric:  Flat affect and limited communication   Vitals:   09/12/18 1057  BP: 120/70  Pulse: 77  Temp: 97.7 F (36.5 C)  TempSrc: Oral  SpO2: 92%  Weight: 153 lb (69.4 kg)  Height: 5' 6.5" (1.689 m)      Assessment & Plan:

## 2018-09-12 NOTE — Progress Notes (Signed)
OFFICE NOTE  Chief Complaint:  Short of breath  Primary Care Physician: Hoyt Koch, MD  HPI:  Pamela Mcguire is a pleasant 83 year old female who recently moved from the [place] area back to [place]. She is here today to establish cardiology care. She apparently was recently hospitalized and quite ill in the intensive care unit. She was suffering from diverticulitis and went into atrial fibrillation. She does not recall many of the events however does report that she was cardioverted and started on Rythmol. She must is seen a cardiologist in the hospital however did not have cardiology followup. She has been maintained on this medication and is in sinus rhythm today. She was not started on anticoagulation which is very unusual. She does have a history of hypertension, age greater than 18 and dyslipidemia. Her CHADSVASC score is 2, therefore, long-term anticoagulation is indicated. There are also some discrepancies in her medications. It appears that she has 3 different cholesterol medications.  She denies any chest pain with exertion but does get short of breath. She denies any palpitations, presyncope or syncopal episodes. She reports that she never had a stress test. She denies suffering an MI in the hospital.  Pamela Mcguire returns today for followup. I'm pleased to report her nuclear stress test was negative for ischemia. She has since started Eliquis and is tolerating it well without bleeding problems. We simplified her cholesterol medications and she is currently on Vytorin. She does report poor sleep at night and napping during the day. She says she has to urinate several times at night. She is noted to have a history of snoring but is unclear whether she has any true apnea. She often has trouble falling asleep as well as staying asleep.  I saw Pamela Mcguire back in the office today. She is reporting some mild positional dizziness. She also reports of chest pain when laying  down at night. Although she recently had a stress test as above which was negative. She continues to be fatigued throughout the day. She did undergo a sleep study which was markedly abnormal demonstrating an AHI of close to 50. She is scheduled to have a CPAP titration on March 28. I think this will make a significant difference in her symptoms. She reports no recurrence of her atrial fibrillation and is off Rythmol.  Pamela Mcguire returns today in the office. She denies any recurrent A. fib and is in a sinus rhythm today. There are occasional PVCs. She is actually off of all of her blood pressure medicine due to hypotension. She's had significant weight loss. She's been diagnosed with a lung cancer and is undergone chemotherapy. She apparently said all the chemotherapy that she can tolerate however the tumor is not resolved, but has shrunk. She short of breath most likely related to this. She continues on Eliquis without any bleeding problems. She is intolerant to CPAP despite her severe obstructive sleep apnea due to anxiety. She was previously on Xanax however is out of that today. I advised her that I did not prescribe benzodiazepine's or narcotics and that she should seek out her primary care provider for this.  I had the pleasure of seeing Pamela Mcguire back today in the office. Overall she reports she is doing better and has completed her chemotherapy. She does get some mild shortness of breath which is unchanged. She is describing some chest discomfort however it's mostly when she lays down at night and not with exertion. This is not worsening  is been a problem for several months. She sees to be tolerating Eliquis without bleeding problems. She is on low-dose carvedilol for rate control after she was taken off of Rythmol. Blood pressure is a little low today however in general she has had blood pressures in the 120s and is been asymptomatic. Daughter is concerned about the cost of Vytorin since she is on  Medicare with a supplemental. Previously she had taken Lipitor without problems. Also reportedly she is taking ibuprofen with diphenhydramine (Advil PM) for sleep. I advised her that this is not a good idea based on the fact that ibuprofen may increase her bleeding risk on Eliquis.  08/24/2017  Pamela Mcguire returns today for follow-up. She reports significant shortness of breath. Earlier this week she had a flu shot she's felt unwell for the last couple days. She's had some progressive dyspnea, chills and mildly productive cough. Today she is noted to be markedly tachypnea can the office. Respiratory rate is around 50. She notes some tingling in her fingers and a feeling that she may pass out. Oxygen saturation was 98% on supplemental oxygen. Blood pressure was stable. She reported chest discomfort. EKG shows A. fib with RVR rate of 106.  04/03/2018  Pamela Mcguire was seen today in follow-up.  She again reports shortness of breath.  She was noted to be markedly tachypneic in the office and we applied supplemental oxygen.  She apparently supposed to be wearing oxygen at home but admits to not using it that regularly.  Interestingly she saw pulmonary, Dr. Elsworth Soho, who did not feel she had any significant COPD.  She does have recurrent pleural effusion, status post prior thoracentesis.  Most recently in February or March a CT scan showed moderate sized pleural effusion.  I had a long discussion with her and her daughter today who said that Mrs. Ronan did not want to be resuscitated.  Subsequently she indicated she did want everything done.  I am afraid that her dementia may be confusing the situation.  Her daughter indicated she had had long discussions with her primary care provider who is more appropriate to determine if Mrs. Devita truly does want to be DNR.  At this point, it is clear she does not want further escalation of treatment, including a repeat thoracentesis or referral for possible Pleurx catheter.  She  understands that this may lead to progressive dyspnea and the need for palliative or hospice medicine.  Diuretics are unlikely to be of additional benefit.  I do not think this is diastolic heart failure.  A. fib is rate controlled today at 89.  09/12/2018  Ms. Morrissey is seen today in follow-up.  She was recently hospitalized for acute on chronic diastolic heart failure with pleural effusion.  This required thoracentesis with 1.5 L removed.  She was also diuresed and put on increased dose Lasix.  She uses home oxygen however did not wear it today.  She does have some periods of anxiety associate with breathlessness and this makes her breathing worse.  Oxygen saturations were in the low 90s today and she was placed in oxygen in the office for obvious respiratory distress.  After some coaching and oxygen administration, her breathing improved.  PMHx:  Past Medical History:  Diagnosis Date  . AF (atrial fibrillation) ([place])   . Anxiety   . Arthritis    "thighs, hips, arms" (04/28/2015)  . Breast cancer ([place])    right mastectomy  . CAD (coronary artery disease)   . Dehydration 12/06/2016  .  Dementia ([place])   . Depression   . Diverticulitis   . GERD (gastroesophageal reflux disease)   . HCAP (healthcare-associated pneumonia) 09/15/2015  . Hyperlipidemia   . Hypertension   . Non-small cell carcinoma of lung, stage 3 ([place]) 04/30/2015  . Prediabetes   . Seasonal allergies   . Shortness of breath dyspnea   . Sleep apnea     dx'd 10/2014,refused cpap machine  . Urinary frequency   . UTI (urinary tract infection) 06/06/2015    Past Surgical History:  Procedure Laterality Date  . BREAST SURGERY    . CARDIOVERSION    . Cataract surgery    . CESAREAN SECTION  X 2  . COLON SURGERY    . COLONOSCOPY    . COLONOSCOPY N/A 01/13/2017   Procedure: COLONOSCOPY;  Surgeon: Gatha Mayer, MD;  Location: [facility] ENDOSCOPY;  Service: Endoscopy;  Laterality: N/A;  . ESOPHAGOGASTRODUODENOSCOPY N/A 01/13/2017    Procedure: ESOPHAGOGASTRODUODENOSCOPY (EGD);  Surgeon: Gatha Mayer, MD;  Location: [facility];  Service: Endoscopy;  Laterality: N/A;  . LAPAROSCOPIC PARTIAL COLECTOMY N/A 02/08/2017   Procedure: LAPAROSCOPIC PARTIAL COLECTOMY;  Surgeon: Leighton Ruff, MD;  Location: [facility] ORS;  Service: General;  Laterality: N/A;  . MASTECTOMY Right   . MEDIASTINOSCOPY N/A 04/28/2015   Procedure: MEDIASTINOSCOPY;  Surgeon: [name], MD;  Location: [facility] OR;  Service: Thoracic;  Laterality: N/A;  . TONSILLECTOMY      FAMHx:  Family History  Problem Relation Age of Onset  . Mental illness Mother     SOCHx:   reports that she quit smoking about 25 years ago. Her smoking use included cigarettes. She has a 35.00 pack-year smoking history. She has never used smokeless tobacco. She reports that she drinks about 9.0 standard drinks of alcohol per week. She reports that she does not use drugs.  ALLERGIES:  No Known Allergies  ROS: Pertinent items noted in HPI and remainder of comprehensive ROS otherwise negative.  HOME MEDS: Current Outpatient Medications  Medication Sig Dispense Refill  . albuterol (PROVENTIL HFA;VENTOLIN HFA) 108 (90 Base) MCG/ACT inhaler Inhale 1 puff into the lungs every 6 (six) hours as needed for wheezing or shortness of breath. Reported on 06/01/2016 18 g 1  . atorvastatin (LIPITOR) 40 MG tablet TAKE 1 TABLET(40 MG) BY MOUTH DAILY 90 tablet 1  . DIGOX 125 MCG tablet TAKE 1 TABLET(0.125 MG) BY MOUTH DAILY 30 tablet 5  . diltiazem (CARDIZEM CD) 240 MG 24 hr capsule Take 1 capsule (240 mg total) by mouth daily. 90 capsule 1  . ELIQUIS 5 MG TABS tablet TAKE 1 TABLET(5 MG) BY MOUTH TWICE DAILY (Patient taking differently: Take 5 mg by mouth 2 (two) times daily. ) 180 tablet 1  . ferrous sulfate 325 (65 FE) MG tablet Take 325 mg by mouth every evening.     . furosemide (LASIX) 40 MG tablet Take 1 tablet (40 mg total) by mouth 2 (two) times daily. 60 tablet 11  . ipratropium-albuterol  (DUONEB) 0.5-2.5 (3) MG/3ML SOLN Take 3 mLs by nebulization 4 (four) times daily as needed. 360 mL 11  . loratadine (CLARITIN) 10 MG tablet TAKE 1 TABLET BY MOUTH AT BEDTIME 90 tablet 0  . mirtazapine (REMERON) 15 MG tablet TAKE 1 TABLET(15 MG) BY MOUTH AT BEDTIME 90 tablet 1  . Multiple Vitamins-Minerals (MULTIVITAMIN WITH MINERALS) tablet Take 1 tablet by mouth daily. gummie    . NUTRITIONAL SUPPLEMENT LIQD Take 120 mLs by mouth 3 (three) times daily. MedPass    .  OXYGEN Inhale 2 L into the lungs as needed.    . pantoprazole (PROTONIX) 40 MG tablet TAKE 1 TABLET(40 MG) BY MOUTH DAILY (Patient taking differently: Take 40 mg by mouth daily. ) 90 tablet 0  . sertraline (ZOLOFT) 25 MG tablet Take 1-2 tablets (25-50 mg total) by mouth daily. 60 tablet 3  . tolterodine (DETROL LA) 4 MG 24 hr capsule Take 1 capsule (4 mg total) by mouth daily. 90 capsule 1   No current facility-administered medications for this visit.     LABS/IMAGING: Results for orders placed or performed in visit on 09/12/18 (from the past 48 hour(s))  Brain natriuretic peptide     Status: Abnormal   Collection Time: 09/12/18 11:30 AM  Result Value Ref Range   Pro B Natriuretic peptide (BNP) 175.0 (H) 0.0 - 100.0 pg/mL  CBC     Status: None   Collection Time: 09/12/18 11:30 AM  Result Value Ref Range   WBC 8.9 4.0 - 10.5 K/uL   RBC 4.52 3.87 - 5.11 Mil/uL   Platelets 355.0 150.0 - 400.0 K/uL   Hemoglobin 13.6 12.0 - 15.0 g/dL   HCT 40.9 36.0 - 46.0 %   MCV 90.6 78.0 - 100.0 fl   MCHC 33.2 30.0 - 36.0 g/dL   RDW 14.4 11.5 - 15.5 %  Comprehensive metabolic panel     Status: Abnormal   Collection Time: 09/12/18 11:30 AM  Result Value Ref Range   Sodium 141 135 - 145 mEq/L   Potassium 3.4 (L) 3.5 - 5.1 mEq/L   Chloride 97 96 - 112 mEq/L   CO2 32 19 - 32 mEq/L   Glucose, Bld 154 (H) 70 - 99 mg/dL   BUN 13 6 - 23 mg/dL   Creatinine, Ser 1.05 0.40 - 1.20 mg/dL   Total Bilirubin 0.7 0.2 - 1.2 mg/dL   Alkaline  Phosphatase 54 39 - 117 U/L   AST 14 0 - 37 U/L   ALT 15 0 - 35 U/L   Total Protein 7.0 6.0 - 8.3 g/dL   Albumin 3.8 3.5 - 5.2 g/dL   Calcium 9.4 8.4 - 10.5 mg/dL   GFR 53.37 (L) >60.00 mL/min   No results found.  VITALS: BP (!) 108/56   Pulse 77   Ht 5\' 5"  (1.651 m)   Wt 153 lb (69.4 kg)   LMP  (LMP Unknown)   SpO2 90%   BMI 25.46 kg/m   EXAM: General appearance: alert, moderate distress, pale and thin Neck: no carotid bruit and no JVD Lungs: diminished breath sounds RLL and RML Heart: irregularly irregular rhythm and No murmur Abdomen: soft, non-tender; bowel sounds normal; no masses,  no organomegaly Extremities: extremities normal, atraumatic, no cyanosis or edema Pulses: Diminished bilaterally Skin: Pale, warm, dry Neurologic: Mental status: Awake, anxious, tachypneic Psych: Anxious  EKG: Deferred  ASSESSMENT: 1. Acute respiratory distress-right pleural effusion to the mid lung 2. A. fib with CVR 3. Hypertension- off of medications due to hypotension 4. Dyslipidemia on Vytorin 5. Dyspnea on exertion - newly diagnosed lung cancer  6. Relative bradycardia-fatigue 7. Insomnia/somnolence - severe sleep apnea, intolerant to CPAP due to anxiety  PLAN: 1.   Mrs. Thaker is breathless today in the office - SPO2 is 90%, she refused to bring oxygen to the office. Placed on o2 here for respiratory distress and symptoms improved. She was diuresed and appears to be euvolemic today on higher dose lasix. Will continue this. I feel that she would likely benefit  from low dose benzodiazepine for her anxiety to help improve her work of breathing. Will defer to PCP on this.  Follow-up with me in 3 months.  Pixie Casino, MD, [facility], [place] Director of the Advanced Lipid Disorders &  Cardiovascular Risk Reduction Clinic Diplomate of the American Board of Clinical Lipidology Attending Cardiologist  Direct Dial: [phone]  Fax:  [phone]  Website:  [email or web address]  Nadean Corwin Hilty 09/12/2018, 2:26 PM

## 2018-09-12 NOTE — Patient Instructions (Signed)
We are checking the labs today.   We have sent in zoloft (sertraline) to take 25 mg daily for 1 week, then increase to 50 mg daily (2 pills) daily and call us in about 1 month from now to let us know how this is doing.

## 2018-09-13 ENCOUNTER — Other Ambulatory Visit: Payer: Self-pay | Admitting: Internal Medicine

## 2018-09-14 ENCOUNTER — Other Ambulatory Visit: Payer: Self-pay | Admitting: Internal Medicine

## 2018-09-14 ENCOUNTER — Encounter: Payer: Self-pay | Admitting: Internal Medicine

## 2018-09-14 NOTE — Assessment & Plan Note (Signed)
Checking BNP and CBC and CMP to evaluate. Lungs without rales on exam. Weight is trending down but suspect some of that is not related to fluid loss and is weight loss due to lack of oral intake. She does have SOB but it is not clearly related to CHF at this time.

## 2018-09-14 NOTE — Assessment & Plan Note (Signed)
Suspect multifactorial at this time related to poor oral intake, deconditioning, anxiety, and prior lung surgery/lung disease. Do not feel that CHF is contributing at this time.

## 2018-09-14 NOTE — Assessment & Plan Note (Signed)
Stop lexapro as she did not get benefit from this. Keep remeron for appetite and sleep. Add zoloft 25 mg daily for 1-2 weeks then increase to 50 mg daily. In 1 month follow up and increase again if needed. This is significantly limiting her life at this time.

## 2018-09-20 ENCOUNTER — Telehealth: Payer: Self-pay | Admitting: Internal Medicine

## 2018-09-20 NOTE — Telephone Encounter (Signed)
Per Dr. Debara Pickett the cardiologists his notes from 09/12/18 states "I feel that she would likely benefit from low dose benzodiazepine for her anxiety to help improve her work of breathing. Will defer to PCP on this"  She is intolerant to her CPAP due to her anxiety. Please advise. Does patient need appt?

## 2018-09-20 NOTE — Telephone Encounter (Unsigned)
Copied from [place] [id number]. Topic: General - Inquiry >> Sep 20, 2018 10:13 AM Judyann Munson wrote: Reason for CRM:  [name] the patient daughter is calling to advise she talk to mother  cardiologist  who stated the patient  would likely benefit from low dose benzodiazepine for her anxiety to help improve her work of breathing.  He suggested Ativan 1 mg.  The patient preferred pharmacy: [facility] [place], [place] AT [facility] [address] [phone] (Phone) [phone] (Fax)      Her best contact number is [phone]

## 2018-09-20 NOTE — Telephone Encounter (Signed)
LVM with MD response and the plan from the last office visit which was to take the zoloft 1 pill daily for a week and then increase to two pills daily and call back in a month to let us know how patient is doing

## 2018-09-20 NOTE — Telephone Encounter (Signed)
I would recommend to keep the plan from our visit.

## 2018-10-01 ENCOUNTER — Telehealth: Payer: Self-pay | Admitting: Internal Medicine

## 2018-10-01 NOTE — Telephone Encounter (Signed)
MM PAL 12/9 - moved f/u to 12/10. Left message and mailed schedule. Other appointments remain the same.

## 2018-10-08 ENCOUNTER — Other Ambulatory Visit: Payer: Self-pay | Admitting: Internal Medicine

## 2018-10-08 DIAGNOSIS — F329 Major depressive disorder, single episode, unspecified: Secondary | ICD-10-CM

## 2018-10-08 DIAGNOSIS — F32A Depression, unspecified: Secondary | ICD-10-CM

## 2018-10-08 NOTE — Telephone Encounter (Signed)
Rx has been sent to the pharmacy electronically. ° °

## 2018-10-30 ENCOUNTER — Telehealth: Payer: Self-pay | Admitting: Medical Oncology

## 2018-10-30 ENCOUNTER — Other Ambulatory Visit: Payer: Self-pay

## 2018-10-30 ENCOUNTER — Telehealth: Payer: Self-pay | Admitting: Internal Medicine

## 2018-10-30 ENCOUNTER — Emergency Department (HOSPITAL_COMMUNITY): Payer: Medicare HMO

## 2018-10-30 ENCOUNTER — Inpatient Hospital Stay (HOSPITAL_COMMUNITY)
Admission: EM | Admit: 2018-10-30 | Discharge: 2018-11-12 | DRG: 871 | Disposition: A | Discharge status: Skilled Nursing Facility | Payer: Medicare HMO | Attending: Internal Medicine | Admitting: Internal Medicine

## 2018-10-30 ENCOUNTER — Encounter (HOSPITAL_COMMUNITY): Payer: Self-pay | Admitting: *Deleted

## 2018-10-30 DIAGNOSIS — J13 Pneumonia due to Streptococcus pneumoniae: Secondary | ICD-10-CM | POA: Diagnosis not present

## 2018-10-30 DIAGNOSIS — J9383 Other pneumothorax: Secondary | ICD-10-CM | POA: Diagnosis not present

## 2018-10-30 DIAGNOSIS — I739 Peripheral vascular disease, unspecified: Secondary | ICD-10-CM | POA: Diagnosis present

## 2018-10-30 DIAGNOSIS — R0602 Shortness of breath: Secondary | ICD-10-CM | POA: Diagnosis present

## 2018-10-30 DIAGNOSIS — I639 Cerebral infarction, unspecified: Secondary | ICD-10-CM | POA: Diagnosis present

## 2018-10-30 DIAGNOSIS — I482 Chronic atrial fibrillation, unspecified: Secondary | ICD-10-CM | POA: Diagnosis present

## 2018-10-30 DIAGNOSIS — J9601 Acute respiratory failure with hypoxia: Secondary | ICD-10-CM | POA: Diagnosis not present

## 2018-10-30 DIAGNOSIS — C3411 Malignant neoplasm of upper lobe, right bronchus or lung: Secondary | ICD-10-CM | POA: Diagnosis present

## 2018-10-30 DIAGNOSIS — M1991 Primary osteoarthritis, unspecified site: Secondary | ICD-10-CM | POA: Diagnosis present

## 2018-10-30 DIAGNOSIS — J9602 Acute respiratory failure with hypercapnia: Secondary | ICD-10-CM | POA: Diagnosis not present

## 2018-10-30 DIAGNOSIS — R0902 Hypoxemia: Secondary | ICD-10-CM

## 2018-10-30 DIAGNOSIS — Z85118 Personal history of other malignant neoplasm of bronchus and lung: Secondary | ICD-10-CM

## 2018-10-30 DIAGNOSIS — F419 Anxiety disorder, unspecified: Secondary | ICD-10-CM | POA: Diagnosis present

## 2018-10-30 DIAGNOSIS — J90 Pleural effusion, not elsewhere classified: Secondary | ICD-10-CM

## 2018-10-30 DIAGNOSIS — C50919 Malignant neoplasm of unspecified site of unspecified female breast: Secondary | ICD-10-CM | POA: Diagnosis present

## 2018-10-30 DIAGNOSIS — I2510 Atherosclerotic heart disease of native coronary artery without angina pectoris: Secondary | ICD-10-CM | POA: Diagnosis present

## 2018-10-30 DIAGNOSIS — C3491 Malignant neoplasm of unspecified part of right bronchus or lung: Secondary | ICD-10-CM

## 2018-10-30 DIAGNOSIS — K219 Gastro-esophageal reflux disease without esophagitis: Secondary | ICD-10-CM | POA: Diagnosis present

## 2018-10-30 DIAGNOSIS — F329 Major depressive disorder, single episode, unspecified: Secondary | ICD-10-CM | POA: Diagnosis present

## 2018-10-30 DIAGNOSIS — J302 Other seasonal allergic rhinitis: Secondary | ICD-10-CM | POA: Diagnosis present

## 2018-10-30 DIAGNOSIS — Z79899 Other long term (current) drug therapy: Secondary | ICD-10-CM

## 2018-10-30 DIAGNOSIS — Z9114 Patient's other noncompliance with medication regimen: Secondary | ICD-10-CM

## 2018-10-30 DIAGNOSIS — J189 Pneumonia, unspecified organism: Secondary | ICD-10-CM | POA: Diagnosis not present

## 2018-10-30 DIAGNOSIS — I10 Essential (primary) hypertension: Secondary | ICD-10-CM | POA: Diagnosis present

## 2018-10-30 DIAGNOSIS — Z9011 Acquired absence of right breast and nipple: Secondary | ICD-10-CM

## 2018-10-30 DIAGNOSIS — Z7189 Other specified counseling: Secondary | ICD-10-CM | POA: Diagnosis not present

## 2018-10-30 DIAGNOSIS — F0390 Unspecified dementia without behavioral disturbance: Secondary | ICD-10-CM | POA: Diagnosis present

## 2018-10-30 DIAGNOSIS — R6520 Severe sepsis without septic shock: Secondary | ICD-10-CM

## 2018-10-30 DIAGNOSIS — J14 Pneumonia due to Hemophilus influenzae: Secondary | ICD-10-CM | POA: Diagnosis present

## 2018-10-30 DIAGNOSIS — E785 Hyperlipidemia, unspecified: Secondary | ICD-10-CM | POA: Diagnosis present

## 2018-10-30 DIAGNOSIS — A419 Sepsis, unspecified organism: Secondary | ICD-10-CM | POA: Diagnosis present

## 2018-10-30 DIAGNOSIS — Z9049 Acquired absence of other specified parts of digestive tract: Secondary | ICD-10-CM

## 2018-10-30 DIAGNOSIS — M47815 Spondylosis without myelopathy or radiculopathy, thoracolumbar region: Secondary | ICD-10-CM | POA: Diagnosis present

## 2018-10-30 DIAGNOSIS — Z853 Personal history of malignant neoplasm of breast: Secondary | ICD-10-CM

## 2018-10-30 DIAGNOSIS — J9621 Acute and chronic respiratory failure with hypoxia: Secondary | ICD-10-CM | POA: Diagnosis present

## 2018-10-30 DIAGNOSIS — I4891 Unspecified atrial fibrillation: Secondary | ICD-10-CM | POA: Diagnosis present

## 2018-10-30 DIAGNOSIS — J440 Chronic obstructive pulmonary disease with acute lower respiratory infection: Secondary | ICD-10-CM | POA: Diagnosis present

## 2018-10-30 DIAGNOSIS — I480 Paroxysmal atrial fibrillation: Secondary | ICD-10-CM | POA: Diagnosis present

## 2018-10-30 DIAGNOSIS — Z66 Do not resuscitate: Secondary | ICD-10-CM | POA: Diagnosis present

## 2018-10-30 DIAGNOSIS — J9600 Acute respiratory failure, unspecified whether with hypoxia or hypercapnia: Secondary | ICD-10-CM

## 2018-10-30 DIAGNOSIS — R042 Hemoptysis: Secondary | ICD-10-CM | POA: Diagnosis present

## 2018-10-30 DIAGNOSIS — E876 Hypokalemia: Secondary | ICD-10-CM | POA: Diagnosis present

## 2018-10-30 DIAGNOSIS — Z8673 Personal history of transient ischemic attack (TIA), and cerebral infarction without residual deficits: Secondary | ICD-10-CM

## 2018-10-30 DIAGNOSIS — J811 Chronic pulmonary edema: Secondary | ICD-10-CM | POA: Diagnosis not present

## 2018-10-30 DIAGNOSIS — Z902 Acquired absence of lung [part of]: Secondary | ICD-10-CM

## 2018-10-30 DIAGNOSIS — I110 Hypertensive heart disease with heart failure: Secondary | ICD-10-CM | POA: Diagnosis present

## 2018-10-30 DIAGNOSIS — D638 Anemia in other chronic diseases classified elsewhere: Secondary | ICD-10-CM | POA: Diagnosis present

## 2018-10-30 DIAGNOSIS — I5033 Acute on chronic diastolic (congestive) heart failure: Secondary | ICD-10-CM | POA: Diagnosis present

## 2018-10-30 DIAGNOSIS — M40209 Unspecified kyphosis, site unspecified: Secondary | ICD-10-CM | POA: Diagnosis present

## 2018-10-30 DIAGNOSIS — G4733 Obstructive sleep apnea (adult) (pediatric): Secondary | ICD-10-CM | POA: Diagnosis present

## 2018-10-30 DIAGNOSIS — J9611 Chronic respiratory failure with hypoxia: Secondary | ICD-10-CM | POA: Diagnosis present

## 2018-10-30 DIAGNOSIS — Z87891 Personal history of nicotine dependence: Secondary | ICD-10-CM

## 2018-10-30 DIAGNOSIS — Z87440 Personal history of urinary (tract) infections: Secondary | ICD-10-CM

## 2018-10-30 DIAGNOSIS — Z85038 Personal history of other malignant neoplasm of large intestine: Secondary | ICD-10-CM

## 2018-10-30 DIAGNOSIS — M160 Bilateral primary osteoarthritis of hip: Secondary | ICD-10-CM | POA: Diagnosis present

## 2018-10-30 DIAGNOSIS — Z9221 Personal history of antineoplastic chemotherapy: Secondary | ICD-10-CM

## 2018-10-30 DIAGNOSIS — Z818 Family history of other mental and behavioral disorders: Secondary | ICD-10-CM

## 2018-10-30 DIAGNOSIS — Z9981 Dependence on supplemental oxygen: Secondary | ICD-10-CM

## 2018-10-30 DIAGNOSIS — R7303 Prediabetes: Secondary | ICD-10-CM | POA: Diagnosis present

## 2018-10-30 DIAGNOSIS — J441 Chronic obstructive pulmonary disease with (acute) exacerbation: Secondary | ICD-10-CM | POA: Diagnosis present

## 2018-10-30 DIAGNOSIS — R0600 Dyspnea, unspecified: Secondary | ICD-10-CM | POA: Diagnosis not present

## 2018-10-30 DIAGNOSIS — Z9889 Other specified postprocedural states: Secondary | ICD-10-CM

## 2018-10-30 DIAGNOSIS — B963 Hemophilus influenzae [H. influenzae] as the cause of diseases classified elsewhere: Secondary | ICD-10-CM | POA: Diagnosis not present

## 2018-10-30 DIAGNOSIS — Z7901 Long term (current) use of anticoagulants: Secondary | ICD-10-CM

## 2018-10-30 DIAGNOSIS — R6510 Systemic inflammatory response syndrome (SIRS) of non-infectious origin without acute organ dysfunction: Secondary | ICD-10-CM | POA: Diagnosis not present

## 2018-10-30 DIAGNOSIS — I6521 Occlusion and stenosis of right carotid artery: Secondary | ICD-10-CM | POA: Diagnosis present

## 2018-10-30 DIAGNOSIS — Z8701 Personal history of pneumonia (recurrent): Secondary | ICD-10-CM

## 2018-10-30 DIAGNOSIS — Z923 Personal history of irradiation: Secondary | ICD-10-CM

## 2018-10-30 DIAGNOSIS — D72829 Elevated white blood cell count, unspecified: Secondary | ICD-10-CM | POA: Diagnosis not present

## 2018-10-30 DIAGNOSIS — Z515 Encounter for palliative care: Secondary | ICD-10-CM | POA: Diagnosis not present

## 2018-10-30 LAB — COMPREHENSIVE METABOLIC PANEL
ALT: 15 U/L (ref 0–44)
AST: 23 U/L (ref 15–41)
Albumin: 3.1 g/dL — ABNORMAL LOW (ref 3.5–5.0)
Alkaline Phosphatase: 54 U/L (ref 38–126)
Anion gap: 11 (ref 5–15)
BUN: 13 mg/dL (ref 8–23)
CALCIUM: 8 mg/dL — AB (ref 8.9–10.3)
CO2: 32 mmol/L (ref 22–32)
Chloride: 92 mmol/L — ABNORMAL LOW (ref 98–111)
Creatinine, Ser: 1.04 mg/dL — ABNORMAL HIGH (ref 0.44–1.00)
GFR calc Af Amer: 58 mL/min — ABNORMAL LOW (ref >60)
GFR calc non Af Amer: 50 mL/min — ABNORMAL LOW (ref >60)
Glucose, Bld: 162 mg/dL — ABNORMAL HIGH (ref 70–99)
Potassium: 3 mmol/L — ABNORMAL LOW (ref 3.5–5.1)
Sodium: 135 mmol/L (ref 135–145)
TOTAL PROTEIN: 6.2 g/dL — AB (ref 6.5–8.1)
Total Bilirubin: 1.3 mg/dL — ABNORMAL HIGH (ref 0.3–1.2)

## 2018-10-30 LAB — I-STAT CHEM 8, ED
BUN: 12 mg/dL (ref 8–23)
Calcium, Ion: 0.95 mmol/L — ABNORMAL LOW (ref 1.15–1.40)
Chloride: 89 mmol/L — ABNORMAL LOW (ref 98–111)
Creatinine, Ser: 0.9 mg/dL (ref 0.44–1.00)
Glucose, Bld: 161 mg/dL — ABNORMAL HIGH (ref 70–99)
HCT: 37 % (ref 36.0–46.0)
HEMOGLOBIN: 12.6 g/dL (ref 12.0–15.0)
Potassium: 3 mmol/L — ABNORMAL LOW (ref 3.5–5.1)
Sodium: 134 mmol/L — ABNORMAL LOW (ref 135–145)
TCO2: 34 mmol/L — ABNORMAL HIGH (ref 22–32)

## 2018-10-30 LAB — CBC WITH DIFFERENTIAL/PLATELET
Abs Immature Granulocytes: 0.17 10*3/uL — ABNORMAL HIGH (ref 0.00–0.07)
Basophils Absolute: 0.1 10*3/uL (ref 0.0–0.1)
Basophils Relative: 0 %
Eosinophils Absolute: 0 10*3/uL (ref 0.0–0.5)
Eosinophils Relative: 0 %
HCT: 36.3 % (ref 36.0–46.0)
HEMOGLOBIN: 11.9 g/dL — AB (ref 12.0–15.0)
Immature Granulocytes: 1 %
Lymphocytes Relative: 3 %
Lymphs Abs: 0.4 10*3/uL — ABNORMAL LOW (ref 0.7–4.0)
MCH: 30.7 pg (ref 26.0–34.0)
MCHC: 32.8 g/dL (ref 30.0–36.0)
MCV: 93.6 fL (ref 80.0–100.0)
Monocytes Absolute: 0.8 10*3/uL (ref 0.1–1.0)
Monocytes Relative: 5 %
Neutro Abs: 14.6 10*3/uL — ABNORMAL HIGH (ref 1.7–7.7)
Neutrophils Relative %: 91 %
Platelets: 298 10*3/uL (ref 150–400)
RBC: 3.88 MIL/uL (ref 3.87–5.11)
RDW: 14 % (ref 11.5–15.5)
WBC: 16 10*3/uL — ABNORMAL HIGH (ref 4.0–10.5)
nRBC: 0 % (ref 0.0–0.2)

## 2018-10-30 LAB — URINALYSIS, ROUTINE W REFLEX MICROSCOPIC
Bilirubin Urine: NEGATIVE
Glucose, UA: NEGATIVE mg/dL
Ketones, ur: NEGATIVE mg/dL
Leukocytes, UA: NEGATIVE
Nitrite: NEGATIVE
PROTEIN: NEGATIVE mg/dL
Specific Gravity, Urine: 1.019 (ref 1.005–1.030)
pH: 5 (ref 5.0–8.0)

## 2018-10-30 LAB — I-STAT CG4 LACTIC ACID, ED
Lactic Acid, Venous: 4.05 mmol/L (ref 0.5–1.9)
Lactic Acid, Venous: 2.1 mmol/L (ref 0.5–1.9)

## 2018-10-30 LAB — LIPASE, BLOOD: Lipase: 28 U/L (ref 11–51)

## 2018-10-30 LAB — I-STAT TROPONIN, ED: TROPONIN I, POC: 0.01 ng/mL (ref 0.00–0.08)

## 2018-10-30 MED ORDER — SODIUM CHLORIDE 0.9 % IV SOLN
Freq: Once | INTRAVENOUS | Status: AC
  Administered 2018-10-30: 22:00:00 via INTRAVENOUS

## 2018-10-30 MED ORDER — SODIUM CHLORIDE 0.9 % IV BOLUS (SEPSIS)
1250.0000 mL | Freq: Once | INTRAVENOUS | Status: AC
  Administered 2018-10-30: 1250 mL via INTRAVENOUS

## 2018-10-30 MED ORDER — IOPAMIDOL (ISOVUE-370) INJECTION 76%
100.0000 mL | Freq: Once | INTRAVENOUS | Status: AC | PRN
  Administered 2018-10-30: 100 mL via INTRAVENOUS

## 2018-10-30 MED ORDER — ACETAMINOPHEN 500 MG PO TABS
1000.0000 mg | ORAL_TABLET | Freq: Once | ORAL | Status: AC
  Administered 2018-10-30: 1000 mg via ORAL
  Filled 2018-10-30: qty 2

## 2018-10-30 MED ORDER — PIPERACILLIN-TAZOBACTAM 3.375 G IVPB 30 MIN
3.3750 g | Freq: Once | INTRAVENOUS | Status: AC
  Administered 2018-10-30: 3.375 g via INTRAVENOUS
  Filled 2018-10-30: qty 50

## 2018-10-30 MED ORDER — SODIUM CHLORIDE 0.9 % IV BOLUS
1000.0000 mL | Freq: Once | INTRAVENOUS | Status: AC
  Administered 2018-10-30: 1000 mL via INTRAVENOUS

## 2018-10-30 MED ORDER — MORPHINE SULFATE (PF) 2 MG/ML IV SOLN
1.0000 mg | INTRAVENOUS | Status: DC | PRN
  Administered 2018-10-31 (×2): 1 mg via INTRAVENOUS
  Filled 2018-10-30 (×2): qty 1

## 2018-10-30 MED ORDER — VANCOMYCIN HCL 10 G IV SOLR
1250.0000 mg | Freq: Once | INTRAVENOUS | Status: AC
  Administered 2018-10-30: 1250 mg via INTRAVENOUS
  Filled 2018-10-30: qty 1250

## 2018-10-30 MED ORDER — IOPAMIDOL (ISOVUE-370) INJECTION 76%
INTRAVENOUS | Status: AC
  Administered 2018-10-30: 19:00:00
  Filled 2018-10-30: qty 100

## 2018-10-30 NOTE — Telephone Encounter (Signed)
Chest pain and SOB- pt in ED.

## 2018-10-30 NOTE — ED Notes (Signed)
Ed provider Tyrone Nine notified patient has a critical lactic acid value of 2.10

## 2018-10-30 NOTE — Telephone Encounter (Signed)
FYI

## 2018-10-30 NOTE — ED Notes (Signed)
Bed: WA02 Expected date:  Expected time:  Means of arrival:  Comments: EMS sepsis

## 2018-10-30 NOTE — ED Provider Notes (Signed)
[facility] DEPT Provider Note   CSN: [id number] Arrival date & time: 10/30/18  1519     History   Chief Complaint Chief Complaint  Patient presents with  . Shortness of Breath    HPI Pamela Mcguire is a 83 y.o. female.  83 yo F with a chief complaint of shortness of breath.  This is been an ongoing issue for her but worsening over the past couple days.  Her oxygen was increased by EMS due to tachypnea.  Patient has had a productive cough for the past week or so.  Having some left-sided chest pain that is hard for her to describe.  Nothing seems to make it better or worse.  She had one episode of emesis this morning.  Denies urinary symptoms denies fevers denies diarrhea.  Denies abdominal pain.  The history is provided by the patient.  Illness  This is a new problem. The current episode started more than 2 days ago. The problem occurs constantly. The problem has been gradually worsening. Associated symptoms include chest pain and shortness of breath. Pertinent negatives include no abdominal pain and no headaches. Nothing aggravates the symptoms. Nothing relieves the symptoms. She has tried nothing for the symptoms. The treatment provided no relief.    Past Medical History:  Diagnosis Date  . AF (atrial fibrillation) ([place])   . Anxiety   . Arthritis    "thighs, hips, arms" (04/28/2015)  . Breast cancer ([place])    right mastectomy  . CAD (coronary artery disease)   . Dehydration 12/06/2016  . Dementia ([place])   . Depression   . Diverticulitis   . GERD (gastroesophageal reflux disease)   . HCAP (healthcare-associated pneumonia) 09/15/2015  . Hyperlipidemia   . Hypertension   . Non-small cell carcinoma of lung, stage 3 ([place]) 04/30/2015  . Prediabetes   . Seasonal allergies   . Shortness of breath dyspnea   . Sleep apnea     dx'd 10/2014,refused cpap machine  . Urinary frequency   . UTI (urinary tract infection) 06/06/2015    Patient Active Problem  List   Diagnosis Date Noted  . SIRS (systemic inflammatory response syndrome) ([place]) 10/30/2018  . Chronic respiratory failure with hypoxia ([place]), 2L home O2 08/29/2018  . SOB (shortness of breath) 08/29/2018  . Acute on chronic diastolic CHF (congestive heart failure) ([place]) 08/29/2018  . COPD exacerbation ([place]) 08/26/2017  . Pleural effusion transudative 08/22/2017  . Medicare annual wellness visit, subsequent 07/10/2017  . Encounter for general adult medical examination with abnormal findings 07/10/2017  . Insomnia due to medical condition 04/18/2017  . Blurred vision 04/18/2017  . Cancer of ascending colon s/p lap colectomy 02/08/2017 02/08/2017  . Iron deficiency anemia   . Fundic gland polyps of stomach, benign   . Fracture of L1 vertebra ([place]) 10/19/2015  . Weakness   . Compression fracture 09/13/2015  . Mobility impaired   . Fatigue 08/18/2015  . Major depression 08/18/2015  . Frequent stools 08/18/2015  . Tachypnea 06/22/2015  . Adenocarcinoma of upper lobe of right lung 04/30/2015  . Mediastinal adenopathy 04/24/2015  . Severe obstructive sleep apnea 03/27/2015  . Lung mass 03/27/2015  . Folate deficiency 09/21/2014  . Paroxysmal a-fib ([place]) 05/27/2014  . Essential hypertension 05/27/2014  . Breast cancer ([place]) 04/17/2014  . Prediabetes 04/17/2014  . CAD (coronary artery disease) 04/16/2014  . Hyperlipidemia 04/16/2014    Past Surgical History:  Procedure Laterality Date  . BREAST SURGERY    . CARDIOVERSION    .  Cataract surgery    . CESAREAN SECTION  X 2  . COLON SURGERY    . COLONOSCOPY    . COLONOSCOPY N/A 01/13/2017   Procedure: COLONOSCOPY;  Surgeon: Gatha Mayer, MD;  Location: [facility] ENDOSCOPY;  Service: Endoscopy;  Laterality: N/A;  . ESOPHAGOGASTRODUODENOSCOPY N/A 01/13/2017   Procedure: ESOPHAGOGASTRODUODENOSCOPY (EGD);  Surgeon: Gatha Mayer, MD;  Location: [facility];  Service: Endoscopy;  Laterality: N/A;  . LAPAROSCOPIC PARTIAL COLECTOMY N/A  02/08/2017   Procedure: LAPAROSCOPIC PARTIAL COLECTOMY;  Surgeon: Leighton Ruff, MD;  Location: [facility] ORS;  Service: General;  Laterality: N/A;  . MASTECTOMY Right   . MEDIASTINOSCOPY N/A 04/28/2015   Procedure: MEDIASTINOSCOPY;  Surgeon: [name], MD;  Location: [facility] OR;  Service: Thoracic;  Laterality: N/A;  . TONSILLECTOMY       OB History   None      Home Medications    Prior to Admission medications   Medication Sig Start Date End Date Taking? Authorizing Provider  albuterol (PROVENTIL HFA;VENTOLIN HFA) 108 (90 Base) MCG/ACT inhaler Inhale 1 puff into the lungs every 6 (six) hours as needed for wheezing or shortness of breath. Reported on 06/01/2016 11/15/16  Yes Golden Circle, FNP  apixaban (ELIQUIS) 5 MG TABS tablet Take 1 tablet (5 mg total) by mouth 2 (two) times daily. 10/08/18  Yes Pixie Casino, MD  atorvastatin (LIPITOR) 40 MG tablet TAKE 1 TABLET(40 MG) BY MOUTH DAILY 06/06/18  Yes Hoyt Koch, MD  DIGOX 125 MCG tablet TAKE 1 TABLET(0.125 MG) BY MOUTH DAILY Patient taking differently: Take 0.125 mg by mouth daily.  02/12/18  Yes Hilty, Nadean Corwin, MD  diltiazem (CARDIZEM CD) 240 MG 24 hr capsule Take 1 capsule (240 mg total) by mouth daily. 09/11/18  Yes Hilty, Nadean Corwin, MD  ferrous sulfate 325 (65 FE) MG tablet Take 325 mg by mouth every evening.    Yes [provider]  furosemide (LASIX) 40 MG tablet Take 1 tablet (40 mg total) by mouth 2 (two) times daily. Patient taking differently: Take 40 mg by mouth daily.  08/29/18 08/29/19 Yes Paticia Stack, MD  ipratropium-albuterol (DUONEB) 0.5-2.5 (3) MG/3ML SOLN Take 3 mLs by nebulization 4 (four) times daily as needed. Patient taking differently: Take 3 mLs by nebulization daily as needed (shortness of breath).  09/19/17  Yes Hoyt Koch, MD  mirtazapine (REMERON) 15 MG tablet TAKE 1 TABLET(15 MG) BY MOUTH AT BEDTIME 06/06/18  Yes Hoyt Koch, MD  Multiple Vitamins-Minerals (MULTIVITAMIN  WITH MINERALS) tablet Take 1 tablet by mouth daily. gummie   Yes [provider]  NUTRITIONAL SUPPLEMENT LIQD Take 120 mLs by mouth daily as needed (hydration). MedPass    Yes [provider]  OXYGEN Inhale 2 L into the lungs daily.    Yes [provider]  pantoprazole (PROTONIX) 40 MG tablet TAKE 1 TABLET(40 MG) BY MOUTH DAILY Patient taking differently: Take 40 mg by mouth daily.  10/08/18  Yes Hoyt Koch, MD  sertraline (ZOLOFT) 25 MG tablet Take 1-2 tablets (25-50 mg total) by mouth daily. Patient taking differently: Take 50 mg by mouth daily.  09/12/18  Yes Hoyt Koch, MD  tolterodine (DETROL LA) 4 MG 24 hr capsule Take 1 capsule (4 mg total) by mouth daily. 06/06/18  Yes Hoyt Koch, MD  atorvastatin (LIPITOR) 40 MG tablet TAKE 1 TABLET(40 MG) BY MOUTH DAILY Patient not taking: Reported on 10/30/2018 09/13/18   Hoyt Koch, MD  furosemide (  LASIX) 20 MG tablet TAKE 1 TABLET(20 MG) BY MOUTH DAILY Patient not taking: Reported on 10/30/2018 10/08/18   Pixie Casino, MD  loratadine (CLARITIN) 10 MG tablet TAKE 1 TABLET BY MOUTH AT BEDTIME Patient not taking: TAKE 1 TABLET BY MOUTH AT BEDTIME 09/10/18   Hoyt Koch, MD  mirtazapine (REMERON) 15 MG tablet TAKE 1 TABLET(15 MG) BY MOUTH AT BEDTIME Patient not taking: Reported on 10/30/2018 09/14/18   Hoyt Koch, MD    Family History Family History  Problem Relation Age of Onset  . Mental illness Mother     Social History Social History   Tobacco Use  . Smoking status: Former Smoker    Packs/day: 1.00    Years: 35.00    Pack years: 35.00    Types: Cigarettes    Last attempt to quit: 11/28/1992    Years since quitting: 25.9  . Smokeless tobacco: Never Used  . Tobacco comment: "stopped smoking at age 72"  Substance Use Topics  . Alcohol use: Yes    Alcohol/week: 9.0 standard drinks    Types: 9 Glasses of wine per week    Comment: 04/28/2015 "drink  3, 6oz,  glasses of wine on Sat & Sun"  . Drug use: No     Allergies   Patient has no known allergies.   Review of Systems Review of Systems  Constitutional: Negative for chills and fever.  HENT: Negative for congestion and rhinorrhea.   Eyes: Negative for redness and visual disturbance.  Respiratory: Positive for shortness of breath. Negative for wheezing.   Cardiovascular: Positive for chest pain. Negative for palpitations.  Gastrointestinal: Negative for abdominal pain, nausea and vomiting.  Genitourinary: Negative for dysuria and urgency.  Musculoskeletal: Negative for arthralgias and myalgias.  Skin: Negative for pallor and wound.  Neurological: Negative for dizziness and headaches.     Physical Exam Updated Vital Signs BP (!) 140/52   Pulse 91   Temp (!) 102 F (38.9 C) (Rectal)   Resp (!) 29   Ht 5\' 6"  (1.676 m)   Wt 70.3 kg   LMP  (LMP Unknown)   SpO2 96%   BMI 25.02 kg/m   Physical Exam  Constitutional: She is oriented to person, place, and time. She appears well-developed and well-nourished. No distress.  HENT:  Head: Normocephalic and atraumatic.  Eyes: Pupils are equal, round, and reactive to light. EOM are normal.  Neck: Normal range of motion. Neck supple.  Cardiovascular: Normal rate and regular rhythm. Exam reveals no gallop and no friction rub.  No murmur heard. Pulmonary/Chest: Effort normal. She has no wheezes. She has no rales.  tachypnea  Abdominal: Soft. She exhibits no distension. There is no tenderness.  Musculoskeletal: She exhibits no edema or tenderness.  Neurological: She is alert and oriented to person, place, and time.  Skin: Skin is warm and dry. She is not diaphoretic.  Psychiatric: She has a normal mood and affect. Her behavior is normal.  Nursing note and vitals reviewed.    ED Treatments / Results  Labs (all labs ordered are listed, but only abnormal results are displayed) Labs Reviewed  CBC WITH DIFFERENTIAL/PLATELET -  Abnormal; Notable for the following components:      Result Value   WBC 16.0 (*)    Hemoglobin 11.9 (*)    Neutro Abs 14.6 (*)    Lymphs Abs 0.4 (*)    Abs Immature Granulocytes 0.17 (*)    All other components within normal limits  COMPREHENSIVE  METABOLIC PANEL - Abnormal; Notable for the following components:   Potassium 3.0 (*)    Chloride 92 (*)    Glucose, Bld 162 (*)    Creatinine, Ser 1.04 (*)    Calcium 8.0 (*)    Total Protein 6.2 (*)    Albumin 3.1 (*)    Total Bilirubin 1.3 (*)    GFR calc non Af Amer 50 (*)    GFR calc Af Amer 58 (*)    All other components within normal limits  URINALYSIS, ROUTINE W REFLEX MICROSCOPIC - Abnormal; Notable for the following components:   Hgb urine dipstick SMALL (*)    Bacteria, UA RARE (*)    All other components within normal limits  I-STAT CHEM 8, ED - Abnormal; Notable for the following components:   Sodium 134 (*)    Potassium 3.0 (*)    Chloride 89 (*)    Glucose, Bld 161 (*)    Calcium, Ion 0.95 (*)    TCO2 34 (*)    All other components within normal limits  I-STAT CG4 LACTIC ACID, ED - Abnormal; Notable for the following components:   Lactic Acid, Venous 4.05 (*)    All other components within normal limits  I-STAT CG4 LACTIC ACID, ED - Abnormal; Notable for the following components:   Lactic Acid, Venous 2.10 (*)    All other components within normal limits  CULTURE, BLOOD (ROUTINE X 2)  CULTURE, BLOOD (ROUTINE X 2)  URINE CULTURE  LIPASE, BLOOD  I-STAT TROPONIN, ED    EKG EKG Interpretation  Date/Time:  Tuesday October 30 2018 15:38:16 EST Ventricular Rate:  81 PR Interval:    QRS Duration: 80 QT Interval:  388 QTC Calculation: 451 R Axis:   34 Text Interpretation:  Atrial fibrillation Nonspecific repol abnormality, diffuse leads Otherwise no significant change Confirmed by Deno Etienne [phone]) on 10/30/2018 4:09:43 PM   Radiology Dg Chest 2 View  Result Date: 10/30/2018 CLINICAL DATA:  Cough and  shortness of breath EXAM: CHEST - 2 VIEW COMPARISON:  08/29/2018 FINDINGS: Cardiac shadow is enlarged but stable. Aortic calcifications are again seen. Elevation of the right hemidiaphragm is again noted consistent with prior volume loss. Increased density is noted in the right paratracheal region stable from the previous exam. Patchy atelectatic changes are noted in the bases bilaterally. No sizable effusion is noted. No bony abnormality is seen. IMPRESSION: Patchy bibasilar atelectasis superimposed over more chronic changes with elevation of the right hemidiaphragm. Electronically Signed   By: Inez Catalina M.D.   On: 10/30/2018 16:23    Procedures Procedures (including critical care time)  Medications Ordered in ED Medications  morphine 2 MG/ML injection 1 mg (has no administration in time range)  sodium chloride 0.9 % bolus 1,000 mL (0 mLs Intravenous Stopped 10/30/18 1724)  sodium chloride 0.9 % bolus 1,250 mL (0 mLs Intravenous Stopped 10/30/18 2053)  vancomycin (VANCOCIN) 1,250 mg in sodium chloride 0.9 % 250 mL IVPB (0 mg Intravenous Stopped 10/30/18 2120)  piperacillin-tazobactam (ZOSYN) IVPB 3.375 g (0 g Intravenous Stopped 10/30/18 1728)  iopamidol (ISOVUE-370) 76 % injection (  Contrast Given 10/30/18 1853)  iopamidol (ISOVUE-370) 76 % injection 100 mL (100 mLs Intravenous Contrast Given 10/30/18 1816)  acetaminophen (TYLENOL) tablet 1,000 mg (1,000 mg Oral Given 10/30/18 1841)  sodium chloride 0.9 % bolus 1,000 mL (0 mLs Intravenous Stopped 10/30/18 2153)  0.9 %  sodium chloride infusion ( Intravenous New Bag/Given 10/30/18 2200)     Initial Impression / Assessment  and Plan / ED Course  I have reviewed the triage vital signs and the nursing notes.  Pertinent labs & imaging results that were available during my care of the patient were reviewed by me and considered in my medical decision making (see chart for details).     83 yo F with a chief complaint of shortness of breath.  This is  been ongoing issue for her worsening over the past couple days.  My exam with clear lungs, no appreciable edema.  Patient's blood pressure is somewhat low, she is very tachypneic.  Will obtain a chest x-ray lab work reassess.  Patient's checks x-ray is without focal lithotripsy by me, she is febrile to 102 rectally and has a lactate of 4.  I made her code sepsis, will give fluids and antibiotics.  Patient had diffuse abdominal tenderness on my exam, as the chest x-ray was not the source and she has no urinary symptoms will do a CT of the abdomen pelvis.  I was concerned that the patient's increased oxygen demand and tachypnea that she may have a PE.  We will do a CT angiogram of the chest.  The patient's pressures have remained soft here, I was about to start her on Levophed the when I discussed with nursing she actually had not received her second bolus of fluids and so would only gotten liters at this point.  Pressure bag in the rest of fluids and reassess.  Sepsis - Repeat Assessment  Performed at:    2030  Vitals     Blood pressure (!) 95/47, pulse 74, temperature (!) 102 F (38.9 C), temperature source Rectal, resp. rate (!) 25, height 5\' 6"  (1.676 m), weight 70.3 kg, SpO2 92 %.  Heart:     Regular rate and rhythm  Lungs:    clear  Capillary Refill:   <2 sec  Peripheral Pulse:   Radial pulse palpable  Skin:     Normal Color   On reassessment the patient now has had multiple blood pressures with maps in the 70s after pressure bagging in the last of their 30 cc/kg of IV fluid. Will discuss with the hospitalist.   CRITICAL CARE Performed by: Cecilio Asper   Total critical care time: 80 minutes  Critical care time was exclusive of separately billable procedures and treating other patients.  Critical care was necessary to treat or prevent imminent or life-threatening deterioration.  Critical care was time spent personally by me on the following activities: development of  treatment plan with patient and/or surrogate as well as nursing, discussions with consultants, evaluation of patient's response to treatment, examination of patient, obtaining history from patient or surrogate, ordering and performing treatments and interventions, ordering and review of laboratory studies, ordering and review of radiographic studies, pulse oximetry and re-evaluation of patient's condition.  The patients results and plan were reviewed and discussed.   Any x-rays performed were independently reviewed by myself.   Differential diagnosis were considered with the presenting HPI.  Medications  morphine 2 MG/ML injection 1 mg (has no administration in time range)  sodium chloride 0.9 % bolus 1,000 mL (0 mLs Intravenous Stopped 10/30/18 1724)  sodium chloride 0.9 % bolus 1,250 mL (0 mLs Intravenous Stopped 10/30/18 2053)  vancomycin (VANCOCIN) 1,250 mg in sodium chloride 0.9 % 250 mL IVPB (0 mg Intravenous Stopped 10/30/18 2120)  piperacillin-tazobactam (ZOSYN) IVPB 3.375 g (0 g Intravenous Stopped 10/30/18 1728)  iopamidol (ISOVUE-370) 76 % injection (  Contrast Given 10/30/18 1853)  iopamidol (ISOVUE-370) 76 % injection 100 mL (100 mLs Intravenous Contrast Given 10/30/18 1816)  acetaminophen (TYLENOL) tablet 1,000 mg (1,000 mg Oral Given 10/30/18 1841)  sodium chloride 0.9 % bolus 1,000 mL (0 mLs Intravenous Stopped 10/30/18 2153)  0.9 %  sodium chloride infusion ( Intravenous New Bag/Given 10/30/18 2200)    Vitals:   10/30/18 2144 10/30/18 2200 10/30/18 2230 10/30/18 2337  BP: 120/62 (!) 117/56 (!) 117/55 (!) 140/52  Pulse: 78 84 71 91  Resp: (!) 35 (!) 21 (!) 38 (!) 29  Temp:      TempSrc:      SpO2: 91% 91% 95% 96%  Weight:      Height:        Final diagnoses:  Severe sepsis (HCC)    Admission/ observation were discussed with the admitting physician, patient and/or family and they are comfortable with the plan.   Final Clinical Impressions(s) / ED Diagnoses   Final  diagnoses:  Severe sepsis [facility])    ED Discharge Orders    None       Deno Etienne, DO 10/31/18 [id number]

## 2018-10-30 NOTE — Telephone Encounter (Signed)
Copied from [place] [phone]. Topic: General - Other >> Oct 30, 2018  3:32 PM Margot Ables wrote: Adonis Huguenin NP with [facility] is calling to notify Dr. Sharlet Salina that pt was sent to [facility] today for chest pain and SOB. She went to the home to see pt this morning as part of an insurance benefit.

## 2018-10-30 NOTE — ED Notes (Signed)
ED TO INPATIENT HANDOFF REPORT  Name/Age/Gender Pamela Mcguire 83 y.o. female  Code Status Code Status History    Date Active Date Inactive Code Status Order ID Comments User Context   08/24/2018 2201 08/30/2018 1859 Partial Code [id number]  Merton Border, MD Inpatient   08/25/2017 0215 08/27/2017 1953 Full Code [id number]  Jani Gravel, MD ED   08/05/2017 1741 08/09/2017 1848 Full Code [id number]  Benito Mccreedy, MD Inpatient   02/08/2017 1239 02/15/2017 1935 Full Code [id number]  Leighton Ruff, MD Inpatient   09/13/2015 1819 09/17/2015 1747 Full Code [id number]  Kelvin Cellar, MD Inpatient   06/22/2015 0240 06/26/2015 1925 Full Code [id number]  Theressa Millard, MD Inpatient    Questions for Most Recent Historical Code Status (Order [id number])    Question Answer Comment   In the event of cardiac or respiratory ARREST: Initiate Code Blue, Call Rapid Response Yes    In the event of cardiac or respiratory ARREST: Perform CPR No    In the event of cardiac or respiratory ARREST: Perform Intubation/Mechanical Ventilation Yes    In the event of cardiac or respiratory ARREST: Use NIPPV/BiPAp only if indicated Yes    In the event of cardiac or respiratory ARREST: Administer ACLS medications if indicated Yes    In the event of cardiac or respiratory ARREST: Perform Defibrillation or Cardioversion if indicated Yes       Home/SNF/Other Nursing Home  Chief Complaint sepsis   Level of Care/Admitting Diagnosis ED Disposition    ED Disposition Condition [place]: [facility] [100102]  Level of Care: Stepdown [14]  Admit to SDU based on following criteria: Severe physiological/psychological symptoms:  Any diagnosis requiring assessment & intervention at least every 4 hours on an ongoing basis to obtain desired patient outcomes including stability and rehabilitation  Diagnosis: SIRS (systemic inflammatory response syndrome) [facility]) [[id number]]  Admitting Physician:  Rise Patience [phone]  Attending Physician: Rise Patience [phone]  Estimated length of stay: past midnight tomorrow  Certification:: I certify this patient will need inpatient services for at least 2 midnights  PT Class (Do Not Modify): Inpatient [101]  PT Acc Code (Do Not Modify): Private [1]       Medical History Past Medical History:  Diagnosis Date  . AF (atrial fibrillation) ([place])   . Anxiety   . Arthritis    "thighs, hips, arms" (04/28/2015)  . Breast cancer ([place])    right mastectomy  . CAD (coronary artery disease)   . Dehydration 12/06/2016  . Dementia ([place])   . Depression   . Diverticulitis   . GERD (gastroesophageal reflux disease)   . HCAP (healthcare-associated pneumonia) 09/15/2015  . Hyperlipidemia   . Hypertension   . Non-small cell carcinoma of lung, stage 3 ([place]) 04/30/2015  . Prediabetes   . Seasonal allergies   . Shortness of breath dyspnea   . Sleep apnea     dx'd 10/2014,refused cpap machine  . Urinary frequency   . UTI (urinary tract infection) 06/06/2015    Allergies No Known Allergies  IV Location/Drains/Wounds Patient Lines/Drains/Airways Status   Active Line/Drains/Airways    Name:   Placement date:   Placement time:   Site:   Days:   Peripheral IV 10/30/18 Left Forearm   10/30/18    1554    Forearm   less than 1   Incision (Closed) 02/08/17 Abdomen Other (Comment)   02/08/17    1037     629   Incision -  3 Ports Abdomen Upper;Lateral Left;Lateral Lower;Lateral   02/08/17    1030     629          Labs/Imaging Results for orders placed or performed during the hospital encounter of 10/30/18 (from the past 48 hour(s))  CBC with Differential     Status: Abnormal   Collection Time: 10/30/18  3:56 PM  Result Value Ref Range   WBC 16.0 (H) 4.0 - 10.5 K/uL   RBC 3.88 3.87 - 5.11 MIL/uL   Hemoglobin 11.9 (L) 12.0 - 15.0 g/dL   HCT 36.3 36.0 - 46.0 %   MCV 93.6 80.0 - 100.0 fL   MCH 30.7 26.0 - 34.0 pg   MCHC 32.8 30.0 - 36.0 g/dL    RDW 14.0 11.5 - 15.5 %   Platelets 298 150 - 400 K/uL   nRBC 0.0 0.0 - 0.2 %   Neutrophils Relative % 91 %   Neutro Abs 14.6 (H) 1.7 - 7.7 K/uL   Lymphocytes Relative 3 %   Lymphs Abs 0.4 (L) 0.7 - 4.0 K/uL   Monocytes Relative 5 %   Monocytes Absolute 0.8 0.1 - 1.0 K/uL   Eosinophils Relative 0 %   Eosinophils Absolute 0.0 0.0 - 0.5 K/uL   Basophils Relative 0 %   Basophils Absolute 0.1 0.0 - 0.1 K/uL   Immature Granulocytes 1 %   Abs Immature Granulocytes 0.17 (H) 0.00 - 0.07 K/uL    Comment: Performed at [facility], [place] [address] [place], [place] [address]  Comprehensive metabolic panel     Status: Abnormal   Collection Time: 10/30/18  3:56 PM  Result Value Ref Range   Sodium 135 135 - 145 mmol/L   Potassium 3.0 (L) 3.5 - 5.1 mmol/L   Chloride 92 (L) 98 - 111 mmol/L   CO2 32 22 - 32 mmol/L   Glucose, Bld 162 (H) 70 - 99 mg/dL   BUN 13 8 - 23 mg/dL   Creatinine, Ser 1.04 (H) 0.44 - 1.00 mg/dL   Calcium 8.0 (L) 8.9 - 10.3 mg/dL   Total Protein 6.2 (L) 6.5 - 8.1 g/dL   Albumin 3.1 (L) 3.5 - 5.0 g/dL   AST 23 15 - 41 U/L   ALT 15 0 - 44 U/L   Alkaline Phosphatase 54 38 - 126 U/L   Total Bilirubin 1.3 (H) 0.3 - 1.2 mg/dL   GFR calc non Af Amer 50 (L) >60 mL/min   GFR calc Af Amer 58 (L) >60 mL/min   Anion gap 11 5 - 15    Comment: Performed at [facility], [place] [address], [place], [place] [address]  Lipase, blood     Status: None   Collection Time: 10/30/18  3:56 PM  Result Value Ref Range   Lipase 28 11 - 51 U/L    Comment: Performed at [facility], [place] [address] [place], [place] [address]  Urinalysis, Routine w reflex microscopic     Status: Abnormal   Collection Time: 10/30/18  3:56 PM  Result Value Ref Range   Color, Urine YELLOW YELLOW   APPearance CLEAR CLEAR   Specific Gravity, Urine 1.019 1.005 - 1.030   pH 5.0 5.0 - 8.0   Glucose, UA NEGATIVE NEGATIVE mg/dL   Hgb urine dipstick SMALL (A) NEGATIVE    Bilirubin Urine NEGATIVE NEGATIVE   Ketones, ur NEGATIVE NEGATIVE mg/dL   Protein, ur NEGATIVE NEGATIVE mg/dL   Nitrite NEGATIVE NEGATIVE   Leukocytes,  UA NEGATIVE NEGATIVE   RBC / HPF 0-5 0 - 5 RBC/hpf   WBC, UA 0-5 0 - 5 WBC/hpf   Bacteria, UA RARE (A) NONE SEEN   Squamous Epithelial / LPF 0-5 0 - 5    Comment: Performed at [facility], [place] [address] [place], [place] [address]  I-stat troponin, ED     Status: None   Collection Time: 10/30/18  4:01 PM  Result Value Ref Range   Troponin i, poc 0.01 0.00 - 0.08 ng/mL   Comment 3            Comment: Due to the release kinetics of cTnI, a negative result within the first hours of the onset of symptoms does not rule out myocardial infarction with certainty. If myocardial infarction is still suspected, repeat the test at appropriate intervals.   I-Stat Chem 8, ED     Status: Abnormal   Collection Time: 10/30/18  4:03 PM  Result Value Ref Range   Sodium 134 (L) 135 - 145 mmol/L   Potassium 3.0 (L) 3.5 - 5.1 mmol/L   Chloride 89 (L) 98 - 111 mmol/L   BUN 12 8 - 23 mg/dL   Creatinine, Ser 0.90 0.44 - 1.00 mg/dL   Glucose, Bld 161 (H) 70 - 99 mg/dL   Calcium, Ion 0.95 (L) 1.15 - 1.40 mmol/L   TCO2 34 (H) 22 - 32 mmol/L   Hemoglobin 12.6 12.0 - 15.0 g/dL   HCT 37.0 36.0 - 46.0 %  I-Stat CG4 Lactic Acid, ED     Status: Abnormal   Collection Time: 10/30/18  4:03 PM  Result Value Ref Range   Lactic Acid, Venous 4.05 (HH) 0.5 - 1.9 mmol/L   Comment NOTIFIED PHYSICIAN   I-Stat CG4 Lactic Acid, ED     Status: Abnormal   Collection Time: 10/30/18  6:14 PM  Result Value Ref Range   Lactic Acid, Venous 2.10 (HH) 0.5 - 1.9 mmol/L   Comment NOTIFIED PHYSICIAN    Dg Chest 2 View  Result Date: 10/30/2018 CLINICAL DATA:  Cough and shortness of breath EXAM: CHEST - 2 VIEW COMPARISON:  08/29/2018 FINDINGS: Cardiac shadow is enlarged but stable. Aortic calcifications are again seen. Elevation of the right hemidiaphragm  is again noted consistent with prior volume loss. Increased density is noted in the right paratracheal region stable from the previous exam. Patchy atelectatic changes are noted in the bases bilaterally. No sizable effusion is noted. No bony abnormality is seen. IMPRESSION: Patchy bibasilar atelectasis superimposed over more chronic changes with elevation of the right hemidiaphragm. Electronically Signed   By: Inez Catalina M.D.   On: 10/30/2018 16:23    Pending Labs Unresulted Labs (From admission, onward)    Start     Ordered   10/30/18 1608  Blood Culture (routine x 2)  BLOOD CULTURE X 2,   STAT     10/30/18 1608   10/30/18 1608  Urine culture  ONCE - STAT,   STAT     10/30/18 1608          Vitals/Pain Today's Vitals   10/30/18 2115 10/30/18 2130 10/30/18 2144 10/30/18 2200  BP: (!) 103/46 117/64 120/62 (!) 117/56  Pulse: 72 80 78 84  Resp: (!) 30 (!) 34 (!) 35 (!) 21  Temp:      TempSrc:      SpO2: 93% 91% 91% 91%  Weight:      Height:      PainSc:  Isolation Precautions No active isolations  Medications Medications  sodium chloride 0.9 % bolus 1,000 mL (0 mLs Intravenous Stopped 10/30/18 1724)  sodium chloride 0.9 % bolus 1,250 mL (0 mLs Intravenous Stopped 10/30/18 2053)  vancomycin (VANCOCIN) 1,250 mg in sodium chloride 0.9 % 250 mL IVPB (0 mg Intravenous Stopped 10/30/18 2120)  piperacillin-tazobactam (ZOSYN) IVPB 3.375 g (0 g Intravenous Stopped 10/30/18 1728)  iopamidol (ISOVUE-370) 76 % injection (  Contrast Given 10/30/18 1853)  iopamidol (ISOVUE-370) 76 % injection 100 mL (100 mLs Intravenous Contrast Given 10/30/18 1816)  acetaminophen (TYLENOL) tablet 1,000 mg (1,000 mg Oral Given 10/30/18 1841)  sodium chloride 0.9 % bolus 1,000 mL (0 mLs Intravenous Stopped 10/30/18 2153)  0.9 %  sodium chloride infusion ( Intravenous New Bag/Given 10/30/18 2200)    Mobility walks with device

## 2018-10-30 NOTE — ED Notes (Signed)
Acuity changed per [facility]

## 2018-10-30 NOTE — ED Notes (Signed)
Pt taken to CT.

## 2018-10-30 NOTE — ED Notes (Signed)
Unable to obtain 2nd set of cultures

## 2018-10-30 NOTE — ED Notes (Signed)
Pt attempted to provide urine specimen but was unable to at this time. Pure wick is in place to obtain specimen.

## 2018-10-30 NOTE — ED Triage Notes (Signed)
Per EMS, pt from [facility] complains of chest pain and shortness of breath since yesterday morning. Pt reports productive cough. 12 lead showed a-fib, pt has hx of same. Pt A&O.

## 2018-10-30 NOTE — ED Notes (Signed)
Pt is aware of need for urine specimen and will let us know when she has to void

## 2018-10-30 NOTE — ED Notes (Signed)
Ed provider Tyrone Nine notified patient has a critical lactic acid value of 4.05

## 2018-10-31 ENCOUNTER — Encounter (HOSPITAL_COMMUNITY): Payer: Self-pay | Admitting: Internal Medicine

## 2018-10-31 ENCOUNTER — Telehealth: Payer: Self-pay | Admitting: Internal Medicine

## 2018-10-31 ENCOUNTER — Inpatient Hospital Stay (HOSPITAL_COMMUNITY): Payer: Medicare HMO

## 2018-10-31 DIAGNOSIS — J9601 Acute respiratory failure with hypoxia: Secondary | ICD-10-CM

## 2018-10-31 DIAGNOSIS — R0600 Dyspnea, unspecified: Secondary | ICD-10-CM

## 2018-10-31 DIAGNOSIS — R6510 Systemic inflammatory response syndrome (SIRS) of non-infectious origin without acute organ dysfunction: Secondary | ICD-10-CM

## 2018-10-31 DIAGNOSIS — J90 Pleural effusion, not elsewhere classified: Secondary | ICD-10-CM

## 2018-10-31 DIAGNOSIS — I5033 Acute on chronic diastolic (congestive) heart failure: Secondary | ICD-10-CM

## 2018-10-31 LAB — COMPREHENSIVE METABOLIC PANEL
ALT: 14 U/L (ref 0–44)
AST: 17 U/L (ref 15–41)
Albumin: 2.8 g/dL — ABNORMAL LOW (ref 3.5–5.0)
Alkaline Phosphatase: 56 U/L (ref 38–126)
Anion gap: 8 (ref 5–15)
BUN: 11 mg/dL (ref 8–23)
CO2: 28 mmol/L (ref 22–32)
Calcium: 6.9 mg/dL — ABNORMAL LOW (ref 8.9–10.3)
Chloride: 103 mmol/L (ref 98–111)
Creatinine, Ser: 0.77 mg/dL (ref 0.44–1.00)
GFR calc Af Amer: >60 mL/min (ref >60)
GFR calc non Af Amer: >60 mL/min (ref >60)
GLUCOSE: 147 mg/dL — AB (ref 70–99)
Potassium: 2.9 mmol/L — ABNORMAL LOW (ref 3.5–5.1)
SODIUM: 139 mmol/L (ref 135–145)
Total Bilirubin: 1.1 mg/dL (ref 0.3–1.2)
Total Protein: 5.7 g/dL — ABNORMAL LOW (ref 6.5–8.1)

## 2018-10-31 LAB — TYPE AND SCREEN
ABO/RH(D): B NEG
Antibody Screen: POSITIVE
Unit division: 0

## 2018-10-31 LAB — LACTIC ACID, PLASMA
Lactic Acid, Venous: 1.6 mmol/L (ref 0.5–1.9)
Lactic Acid, Venous: 1.4 mmol/L (ref 0.5–1.9)

## 2018-10-31 LAB — CBC WITH DIFFERENTIAL/PLATELET
Abs Immature Granulocytes: 0.11 10*3/uL — ABNORMAL HIGH (ref 0.00–0.07)
Basophils Absolute: 0 10*3/uL (ref 0.0–0.1)
Basophils Relative: 0 %
Eosinophils Absolute: 0 10*3/uL (ref 0.0–0.5)
Eosinophils Relative: 0 %
HCT: 35.8 % — ABNORMAL LOW (ref 36.0–46.0)
Hemoglobin: 11.3 g/dL — ABNORMAL LOW (ref 12.0–15.0)
Immature Granulocytes: 1 %
Lymphocytes Relative: 6 %
Lymphs Abs: 0.8 10*3/uL (ref 0.7–4.0)
MCH: 30.5 pg (ref 26.0–34.0)
MCHC: 31.6 g/dL (ref 30.0–36.0)
MCV: 96.5 fL (ref 80.0–100.0)
Monocytes Absolute: 1 10*3/uL (ref 0.1–1.0)
Monocytes Relative: 7 %
NEUTROS ABS: 12.2 10*3/uL — AB (ref 1.7–7.7)
Neutrophils Relative %: 86 %
Platelets: 298 10*3/uL (ref 150–400)
RBC: 3.71 MIL/uL — ABNORMAL LOW (ref 3.87–5.11)
RDW: 14.1 % (ref 11.5–15.5)
WBC: 14.2 10*3/uL — ABNORMAL HIGH (ref 4.0–10.5)
nRBC: 0 % (ref 0.0–0.2)

## 2018-10-31 LAB — BASIC METABOLIC PANEL
ANION GAP: 11 (ref 5–15)
BUN: 11 mg/dL (ref 8–23)
CALCIUM: 6.7 mg/dL — AB (ref 8.9–10.3)
CO2: 29 mmol/L (ref 22–32)
Chloride: 96 mmol/L — ABNORMAL LOW (ref 98–111)
Creatinine, Ser: 0.92 mg/dL (ref 0.44–1.00)
GFR calc Af Amer: >60 mL/min (ref >60)
GFR calc non Af Amer: 58 mL/min — ABNORMAL LOW (ref >60)
Glucose, Bld: 117 mg/dL — ABNORMAL HIGH (ref 70–99)
Potassium: 3 mmol/L — ABNORMAL LOW (ref 3.5–5.1)
Sodium: 136 mmol/L (ref 135–145)

## 2018-10-31 LAB — BLOOD GAS, ARTERIAL
Acid-Base Excess: 2.4 mmol/L — ABNORMAL HIGH (ref 0.0–2.0)
Bicarbonate: 28 mmol/L (ref 20.0–28.0)
Drawn by: 331471
FIO2: 100
O2 CONTENT: 15 L/min
O2 SAT: 85.3 %
Patient temperature: 98.6
pCO2 arterial: 50.3 mmHg — ABNORMAL HIGH (ref 32.0–48.0)
pH, Arterial: 7.364 (ref 7.350–7.450)
pO2, Arterial: 57.1 mmHg — ABNORMAL LOW (ref 83.0–108.0)

## 2018-10-31 LAB — BPAM RBC
Blood Product Expiration Date: 201912122359
Unit Type and Rh: 9500

## 2018-10-31 LAB — MAGNESIUM: Magnesium: 1.1 mg/dL — ABNORMAL LOW (ref 1.7–2.4)

## 2018-10-31 LAB — PROCALCITONIN: PROCALCITONIN: 0.1 ng/mL

## 2018-10-31 LAB — INFLUENZA PANEL BY PCR (TYPE A & B)
Influenza A By PCR: NEGATIVE
Influenza B By PCR: NEGATIVE

## 2018-10-31 LAB — CHOLESTEROL, TOTAL: Cholesterol: 153 mg/dL (ref 0–200)

## 2018-10-31 LAB — PROTEIN, TOTAL: Total Protein: 6.2 g/dL — ABNORMAL LOW (ref 6.5–8.1)

## 2018-10-31 LAB — PROTEIN, PLEURAL OR PERITONEAL FLUID

## 2018-10-31 LAB — PROTIME-INR
INR: 1.67
Prothrombin Time: 19.5 seconds — ABNORMAL HIGH (ref 11.4–15.2)

## 2018-10-31 LAB — TROPONIN I
Troponin I: 0.04 ng/mL (ref <0.03)
Troponin I: 0.04 ng/mL (ref <0.03)
Troponin I: 0.05 ng/mL (ref <0.03)

## 2018-10-31 LAB — APTT: aPTT: 42 seconds — ABNORMAL HIGH (ref 24–36)

## 2018-10-31 LAB — MRSA PCR SCREENING: MRSA by PCR: NEGATIVE

## 2018-10-31 LAB — EXPECTORATED SPUTUM ASSESSMENT W GRAM STAIN, RFLX TO RESP C

## 2018-10-31 LAB — LACTATE DEHYDROGENASE, PLEURAL OR PERITONEAL FLUID: LD, Fluid: 100 U/L — ABNORMAL HIGH (ref 3–23)

## 2018-10-31 LAB — LACTATE DEHYDROGENASE: LDH: 136 U/L (ref 98–192)

## 2018-10-31 LAB — DIGOXIN LEVEL: DIGOXIN LVL: 1.1 ng/mL (ref 0.8–2.0)

## 2018-10-31 LAB — EXPECTORATED SPUTUM ASSESSMENT W REFEX TO RESP CULTURE

## 2018-10-31 MED ORDER — MIRTAZAPINE 15 MG PO TABS
15.0000 mg | ORAL_TABLET | Freq: Every day | ORAL | Status: DC
  Administered 2018-10-31 – 2018-11-11 (×13): 15 mg via ORAL
  Filled 2018-10-31 (×13): qty 1

## 2018-10-31 MED ORDER — ONDANSETRON HCL 4 MG PO TABS
4.0000 mg | ORAL_TABLET | Freq: Four times a day (QID) | ORAL | Status: DC | PRN
  Administered 2018-10-31: 4 mg via ORAL
  Filled 2018-10-31: qty 1

## 2018-10-31 MED ORDER — HEPARIN (PORCINE) 25000 UT/250ML-% IV SOLN: 900.0000 [IU]/h | INTRAVENOUS | Status: DC

## 2018-10-31 MED ORDER — IPRATROPIUM-ALBUTEROL 0.5-2.5 (3) MG/3ML IN SOLN
3.0000 mL | Freq: Four times a day (QID) | RESPIRATORY_TRACT | Status: DC
  Administered 2018-10-31 – 2018-11-03 (×14): 3 mL via RESPIRATORY_TRACT
  Filled 2018-10-31 (×13): qty 3

## 2018-10-31 MED ORDER — FUROSEMIDE 10 MG/ML IJ SOLN
INTRAMUSCULAR | Status: AC
  Filled 2018-10-31: qty 4

## 2018-10-31 MED ORDER — VANCOMYCIN HCL IN DEXTROSE 1-5 GM/200ML-% IV SOLN
1000.0000 mg | INTRAVENOUS | Status: DC
  Administered 2018-10-31: 1000 mg via INTRAVENOUS
  Filled 2018-10-31: qty 200

## 2018-10-31 MED ORDER — FUROSEMIDE 10 MG/ML IJ SOLN
40.0000 mg | Freq: Two times a day (BID) | INTRAMUSCULAR | Status: DC
  Administered 2018-10-31 – 2018-11-01 (×2): 40 mg via INTRAVENOUS
  Filled 2018-10-31 (×2): qty 4

## 2018-10-31 MED ORDER — IPRATROPIUM-ALBUTEROL 0.5-2.5 (3) MG/3ML IN SOLN
3.0000 mL | RESPIRATORY_TRACT | Status: DC | PRN
  Administered 2018-10-31: 3 mL via RESPIRATORY_TRACT
  Filled 2018-10-31: qty 3

## 2018-10-31 MED ORDER — PANTOPRAZOLE SODIUM 40 MG PO TBEC
40.0000 mg | DELAYED_RELEASE_TABLET | Freq: Every day | ORAL | Status: DC
  Administered 2018-10-31 – 2018-11-12 (×13): 40 mg via ORAL
  Filled 2018-10-31 (×13): qty 1

## 2018-10-31 MED ORDER — ACETAMINOPHEN 325 MG PO TABS: 650.0000 mg | ORAL_TABLET | Freq: Four times a day (QID) | ORAL | Status: DC | PRN

## 2018-10-31 MED ORDER — SODIUM CHLORIDE 0.9 % IV SOLN
2.0000 g | Freq: Three times a day (TID) | INTRAVENOUS | Status: DC
  Administered 2018-10-31 – 2018-11-04 (×13): 2 g via INTRAVENOUS
  Filled 2018-10-31 (×16): qty 2

## 2018-10-31 MED ORDER — SODIUM CHLORIDE 0.9 % IV SOLN
2.0000 g | Freq: Once | INTRAVENOUS | Status: AC
  Administered 2018-10-31: 2 g via INTRAVENOUS
  Filled 2018-10-31: qty 2

## 2018-10-31 MED ORDER — SODIUM CHLORIDE 0.9% FLUSH: 10.0000 mL | INTRAVENOUS | Status: DC | PRN

## 2018-10-31 MED ORDER — FERROUS SULFATE 325 (65 FE) MG PO TABS
325.0000 mg | ORAL_TABLET | Freq: Every evening | ORAL | Status: DC
  Administered 2018-10-31 – 2018-11-12 (×13): 325 mg via ORAL
  Filled 2018-10-31 (×13): qty 1

## 2018-10-31 MED ORDER — ATORVASTATIN CALCIUM 40 MG PO TABS
40.0000 mg | ORAL_TABLET | Freq: Every day | ORAL | Status: DC
  Administered 2018-10-31 – 2018-11-12 (×13): 40 mg via ORAL
  Filled 2018-10-31 (×13): qty 1

## 2018-10-31 MED ORDER — ACETAMINOPHEN 650 MG RE SUPP: 650.0000 mg | Freq: Four times a day (QID) | RECTAL | Status: DC | PRN

## 2018-10-31 MED ORDER — FESOTERODINE FUMARATE ER 4 MG PO TB24
4.0000 mg | ORAL_TABLET | Freq: Every day | ORAL | Status: DC
  Administered 2018-10-31 – 2018-11-12 (×13): 4 mg via ORAL
  Filled 2018-10-31 (×13): qty 1

## 2018-10-31 MED ORDER — BUDESONIDE 0.25 MG/2ML IN SUSP
0.2500 mg | Freq: Two times a day (BID) | RESPIRATORY_TRACT | Status: DC
  Administered 2018-10-31 – 2018-11-12 (×26): 0.25 mg via RESPIRATORY_TRACT
  Filled 2018-10-31 (×26): qty 2

## 2018-10-31 MED ORDER — DIGOXIN 125 MCG PO TABS
0.1250 mg | ORAL_TABLET | Freq: Every day | ORAL | Status: DC
  Administered 2018-10-31 – 2018-11-12 (×13): 0.125 mg via ORAL
  Filled 2018-10-31 (×13): qty 1

## 2018-10-31 MED ORDER — IPRATROPIUM-ALBUTEROL 0.5-2.5 (3) MG/3ML IN SOLN
3.0000 mL | RESPIRATORY_TRACT | Status: DC
  Administered 2018-10-31: 3 mL via RESPIRATORY_TRACT

## 2018-10-31 MED ORDER — ONDANSETRON HCL 4 MG/2ML IJ SOLN: 4.0000 mg | Freq: Four times a day (QID) | INTRAMUSCULAR | Status: DC | PRN

## 2018-10-31 MED ORDER — DILTIAZEM HCL ER COATED BEADS 240 MG PO CP24
240.0000 mg | ORAL_CAPSULE | Freq: Every day | ORAL | Status: DC
  Administered 2018-11-01 – 2018-11-10 (×10): 240 mg via ORAL
  Filled 2018-10-31 (×7): qty 1
  Filled 2018-10-31 (×5): qty 2

## 2018-10-31 MED ORDER — FUROSEMIDE 10 MG/ML IJ SOLN
60.0000 mg | Freq: Once | INTRAMUSCULAR | Status: AC
  Administered 2018-10-31: 60 mg via INTRAVENOUS

## 2018-10-31 MED ORDER — METRONIDAZOLE IN NACL 5-0.79 MG/ML-% IV SOLN
500.0000 mg | Freq: Three times a day (TID) | INTRAVENOUS | Status: DC
  Administered 2018-10-31 (×2): 500 mg via INTRAVENOUS
  Filled 2018-10-31 (×2): qty 100

## 2018-10-31 MED ORDER — FUROSEMIDE 10 MG/ML IJ SOLN
INTRAMUSCULAR | Status: AC
  Filled 2018-10-31: qty 2

## 2018-10-31 MED ORDER — SERTRALINE HCL 50 MG PO TABS
50.0000 mg | ORAL_TABLET | Freq: Every day | ORAL | Status: DC
  Administered 2018-10-31 – 2018-11-12 (×13): 50 mg via ORAL
  Filled 2018-10-31 (×13): qty 1

## 2018-10-31 MED ORDER — HEPARIN (PORCINE) 25000 UT/250ML-% IV SOLN
1000.0000 [IU]/h | INTRAVENOUS | Status: DC
  Administered 2018-10-31: 900 [IU]/h via INTRAVENOUS
  Filled 2018-10-31: qty 250

## 2018-10-31 MED ORDER — ADULT MULTIVITAMIN W/MINERALS CH
1.0000 | ORAL_TABLET | Freq: Every day | ORAL | Status: DC
  Administered 2018-10-31 – 2018-11-12 (×13): 1 via ORAL
  Filled 2018-10-31 (×13): qty 1

## 2018-10-31 MED ORDER — METHYLPREDNISOLONE SODIUM SUCC 125 MG IJ SOLR: 125.0000 mg | Freq: Once | INTRAMUSCULAR | Status: DC

## 2018-10-31 MED ORDER — HYDROCOD POLST-CPM POLST ER 10-8 MG/5ML PO SUER
5.0000 mL | Freq: Once | ORAL | Status: AC
  Administered 2018-10-31: 5 mL via ORAL
  Filled 2018-10-31: qty 5

## 2018-10-31 MED ORDER — APIXABAN 5 MG PO TABS
5.0000 mg | ORAL_TABLET | Freq: Two times a day (BID) | ORAL | Status: DC
  Administered 2018-10-31: 5 mg via ORAL
  Filled 2018-10-31: qty 1

## 2018-10-31 MED ORDER — SODIUM CHLORIDE 0.9 % IV SOLN
INTRAVENOUS | Status: DC
  Administered 2018-10-31: 01:00:00 via INTRAVENOUS

## 2018-10-31 MED ORDER — KETOROLAC TROMETHAMINE 15 MG/ML IJ SOLN
15.0000 mg | Freq: Once | INTRAMUSCULAR | Status: AC
  Administered 2018-10-31: 15 mg via INTRAVENOUS
  Filled 2018-10-31: qty 1

## 2018-10-31 MED ORDER — POTASSIUM CHLORIDE CRYS ER 20 MEQ PO TBCR
40.0000 meq | EXTENDED_RELEASE_TABLET | ORAL | Status: AC
  Administered 2018-10-31 (×3): 40 meq via ORAL
  Filled 2018-10-31 (×3): qty 2

## 2018-10-31 NOTE — Consult Note (Addendum)
NAME:  Pamela Mcguire, MRN:  [id number], DOB:  10/17/1935, LOS: 1 ADMISSION DATE:  10/30/2018, CONSULTATION DATE:  12/4 REFERRING MD:  [name], CHIEF COMPLAINT:  Acute hypoxic respiratory failure    Brief History   68 yof admitted 12/4 w/ worsening shortness of breath, fever and cough. Admitted w/ working dx of PAF, recurrent right effusion, SIRS and possibly PNA. Treated w/ IVFs and abx. Developed acute resp distress later that am w/ progression of effusion, right sided  Compressive atx and worsening pulmonary edema. Lasix given, Placed on BIPAP and PCCM called.  History of present illness   83 year old white female w/ PMH w/ cc: worsening shortness of breath, chest pain for several days. Did have episode nof N/V which she noticed things were worse after. Did have some cough and this was productive at times w/ streaky hemoptysis. In the ER she was tachycardic, temp 102, CT chest and abd: showed large right effusion w/ marked associated atx. She was placed on antibiotics, administered IVFs for concern about sepsis and admitted to the SDU on oxygen. Early in am hours 12/4 she developed significant worsening of her shortness of breath. Sats dropped down to 80s w/ NRB. She was given lasix, placed on BIPAP and cxr was obtained. This showed progression of the right effusion w/ essentially complete collapse of the right HD and also what looked like new pulmonary edema on left. PCCM asked to eval.   Past Medical History  Diastolic HF, CAF (on DOAC), OSA, non-compliant w/ lasix, stage IIIA NSCLCA (adenocarcinoma) s/p concurrent chemoradiation rx w/ partial response (placed on observation June 2019), recurrent right pleural effusion (was exudative; tapped in July and again in sept flow cytology in sept was neg). Stage II colon cancer s/p partial colectomy & LN dissection (on obs)  Significant Hospital Events   12/4 admitted on 12/4 w/ working dx of large right effusion, possible post-obstruction atelectasis vs PNA.  Was getting IVFs and abx. PCCM consulted for acute worsening of respiratory failure. Bed side thoracentesis completed. 2 liters of exudative appearing fluid obtained. Samples sent for infection, inflammation and malignancy evaluation.   Consults:  Pulm   Procedures:  Right thoracentesis 2 liters cloudy orange appearing fluid removed. 12/4  Significant Diagnostic Tests:  CT chest and abd 12/4: neg for PE, mod to large right effusion, chronic stable subpleural reticulations, rounded opacity right heart border. CT abd: w/ sub centimeter left adrenal nodule    Micro Data:  BCX 12/4> resp culture 12/4: >>> Pleural fluid 12/4>>>  Antimicrobials:  Cefepime 12/4>>> Metronidazole 12/4>>> vanc 12/4>>>  Interim history/subjective:  Now on BIPAP still short of breath and anxious   Objective   Blood pressure (Abnormal) 145/55, pulse 87, temperature 98 F (36.7 C), temperature source Axillary, resp. rate (Abnormal) 31, height 5\' 6"  (1.676 m), weight 74 kg, SpO2 95 %.    FiO2 (%):  [100 %] 100 %   Intake/Output Summary (Last 24 hours) at 10/31/2018 0905 Last data filed at 10/31/2018 0532 Gross per 24 hour  Intake 1200 ml  Output 150 ml  Net 1050 ml   Filed Weights   10/30/18 1533 10/31/18 0120  Weight: 70.3 kg 74 kg    Examination: General: 83 year old white female, increased resp distress.  HENT:  NCAT MMM. No JVD Lungs: decreased right crackles left base + accessory use  Cardiovascular: irreg irreg  Abdomen: soft not tender  Extremities: no sig edema  Neuro: awake and oriented  GU: clear yellow  Resolved Hospital Problem list     Assessment & Plan:   Acute Hypoxic respiratory failure in setting of right lung atelectasis ? possible post obstructive atx vs PNA, recurrent large right effusion AND element of pulmonary edema.  -concern here is could we be missing a worsening/reoccurance of her malignancy.  -Also what is the etiology of the effusion. Past have been exudative. ?  parapneumonic vs malignancy or could it just be filling space d/t chronically collapsed lung?  Plan Therapeutic  and diagnostic thoracentesis: will send for malignancy eval, inflammatory eval, also triglycerides to be sure not chylothorax  Repeat CT scan (non-contrast) AFTER thora. Would be nice to get better idea of what is going on in her chest.  Agree w/ lasix Cont vanc and cefepime day 1 Trend fever and wbc curve DNR/DNI Doubt she would agree to FOB if we see evidence of airway obstruction (note she has had some hemoptysis) Wean oxygen (try high flow) BIPAP PRN  SIRS possible sepsis Plan abx as above  Afib, h/o CAD and diastolic HF Plan Cont tele Rate control Start heparin in place of DOAC at 12 noon Lasix as above   H/o lung cancer s/p RUL lobectomy, chemo and radiation. On observation therapy since June this year Plan Oncology has been notified of her admit   Fluid and electrolyte imbalance: severe hypokalemia Plan Aggressive replacement Ck Mg Repeat chem again later this afternoon   Anemia of chronic disease Plan Trend cbc   Best practice:  Diet: cl liq Pain/Anxiety/Delirium protocol (if indicated): na VAP protocol (if indicated): na DVT prophylaxis: IV heparin  GI prophylaxis: na Glucose control: na Mobility: BR Code Status: full DNR Family Communication: updated family in full.  Disposition:  This is a 83 year old female who is critically ill due to acute hypoxic respiratory failure. She will need therapeutic thoracentesis, titration of oxygen, NIPPV and possibly high flow oxygen. She does not want intubation. I re-confirmed this with her as well as her two daughters.  Labs   CBC: Recent Labs  Lab 10/30/18 1556 10/30/18 1603 10/31/18 0304  WBC 16.0*  --  14.2*  NEUTROABS 14.6*  --  12.2*  HGB 11.9* 12.6 11.3*  HCT 36.3 37.0 35.8*  MCV 93.6  --  96.5  PLT 298  --  893    Basic Metabolic Panel: Recent Labs  Lab 10/30/18 1556 10/30/18 1603  10/31/18 0304  NA 135 134* 139  K 3.0* 3.0* 2.9*  CL 92* 89* 103  CO2 32  --  28  GLUCOSE 162* 161* 147*  BUN 13 12 11   CREATININE 1.04* 0.90 0.77  CALCIUM 8.0*  --  6.9*   GFR: Estimated Creatinine Clearance: 56.8 mL/min (by C-G formula based on SCr of 0.77 mg/dL). Recent Labs  Lab 10/30/18 1556 10/30/18 1603 10/30/18 1814 10/31/18 0048 10/31/18 0057 10/31/18 0304  PROCALCITON  --   --   --  0.10  --   --   WBC 16.0*  --   --   --   --  14.2*  LATICACIDVEN  --  4.05* 2.10*  --  1.6 1.4    Liver Function Tests: Recent Labs  Lab 10/30/18 1556 10/31/18 0304  AST 23 17  ALT 15 14  ALKPHOS 54 56  BILITOT 1.3* 1.1  PROT 6.2* 5.7*  ALBUMIN 3.1* 2.8*   Recent Labs  Lab 10/30/18 1556  LIPASE 28   No results for input(s): AMMONIA in the last 168 hours.  ABG  Component Value Date/Time   PHART 7.364 10/31/2018 0725   PCO2ART 50.3 (H) 10/31/2018 0725   PO2ART 57.1 (L) 10/31/2018 0725   HCO3 28.0 10/31/2018 0725   TCO2 34 (H) 10/30/2018 1603   O2SAT 85.3 10/31/2018 0725     Coagulation Profile: Recent Labs  Lab 10/31/18 0048  INR 1.67    Cardiac Enzymes: Recent Labs  Lab 10/31/18 0048 10/31/18 0606  TROPONINI 0.04* 0.04*    HbA1C: Hgb A1c MFr Bld  Date/Time Value Ref Range Status  08/05/2017 06:35 PM 6.0 (H) 4.8 - 5.6 % Final    Comment:    (NOTE) Pre diabetes:          5.7%-6.4% Diabetes:              >6.4% Glycemic control for   <7.0% adults with diabetes     CBG: No results for input(s): GLUCAP in the last 168 hours.  Review of Systems:   Review of Systems - History obtained from the patient General ROS: positive for  - fatigue, fever and malaise negative for - hot flashes, night sweats or sleep disturbance ENT ROS: negative Hematological and Lymphatic ROS: negative Endocrine ROS: negative Respiratory ROS: positive for - cough, hemoptysis, pleuritic pain, shortness of breath and sputum changes Cardiovascular ROS: no chest pain or  dyspnea on exertion Gastrointestinal ROS: no abdominal pain, change in bowel habits, or black or bloody stools Genito-Urinary ROS: no dysuria, trouble voiding, or hematuria Musculoskeletal ROS: negative Neurological ROS: no TIA or stroke symptoms  Past Medical History  She,  has a past medical history of AF (atrial fibrillation) ([place]), Anxiety, Arthritis, Breast cancer ([place]), CAD (coronary artery disease), Dehydration (12/06/2016), Dementia ([place]), Depression, Diverticulitis, GERD (gastroesophageal reflux disease), HCAP (healthcare-associated pneumonia) (09/15/2015), Hyperlipidemia, Hypertension, Non-small cell carcinoma of lung, stage 3 ([place]) (04/30/2015), Prediabetes, Seasonal allergies, Shortness of breath dyspnea, Sleep apnea, Urinary frequency, and UTI (urinary tract infection) (06/06/2015).   Surgical History    Past Surgical History:  Procedure Laterality Date  . BREAST SURGERY    . CARDIOVERSION    . Cataract surgery    . CESAREAN SECTION  X 2  . COLON SURGERY    . COLONOSCOPY    . COLONOSCOPY N/A 01/13/2017   Procedure: COLONOSCOPY;  Surgeon: Gatha Mayer, MD;  Location: [facility] ENDOSCOPY;  Service: Endoscopy;  Laterality: N/A;  . ESOPHAGOGASTRODUODENOSCOPY N/A 01/13/2017   Procedure: ESOPHAGOGASTRODUODENOSCOPY (EGD);  Surgeon: Gatha Mayer, MD;  Location: [facility];  Service: Endoscopy;  Laterality: N/A;  . LAPAROSCOPIC PARTIAL COLECTOMY N/A 02/08/2017   Procedure: LAPAROSCOPIC PARTIAL COLECTOMY;  Surgeon: Leighton Ruff, MD;  Location: [facility] ORS;  Service: General;  Laterality: N/A;  . MASTECTOMY Right   . MEDIASTINOSCOPY N/A 04/28/2015   Procedure: MEDIASTINOSCOPY;  Surgeon: [name], MD;  Location: [facility] OR;  Service: Thoracic;  Laterality: N/A;  . TONSILLECTOMY       Social History   reports that she quit smoking about 25 years ago. Her smoking use included cigarettes. She has a 35.00 pack-year smoking history. She has never used smokeless tobacco. She reports that she drinks  about 9.0 standard drinks of alcohol per week. She reports that she does not use drugs.   Family History   Her family history includes Mental illness in her mother.   Allergies No Known Allergies   Home Medications  Prior to Admission medications   Medication Sig Start Date End Date Taking? Authorizing Provider  albuterol (PROVENTIL HFA;VENTOLIN HFA) 108 (90 Base) MCG/ACT inhaler  Inhale 1 puff into the lungs every 6 (six) hours as needed for wheezing or shortness of breath. Reported on 06/01/2016 11/15/16  Yes Golden Circle, FNP  apixaban (ELIQUIS) 5 MG TABS tablet Take 1 tablet (5 mg total) by mouth 2 (two) times daily. 10/08/18  Yes Pixie Casino, MD  atorvastatin (LIPITOR) 40 MG tablet TAKE 1 TABLET(40 MG) BY MOUTH DAILY 06/06/18  Yes Hoyt Koch, MD  DIGOX 125 MCG tablet TAKE 1 TABLET(0.125 MG) BY MOUTH DAILY Patient taking differently: Take 0.125 mg by mouth daily.  02/12/18  Yes Hilty, Nadean Corwin, MD  diltiazem (CARDIZEM CD) 240 MG 24 hr capsule Take 1 capsule (240 mg total) by mouth daily. 09/11/18  Yes Hilty, Nadean Corwin, MD  ferrous sulfate 325 (65 FE) MG tablet Take 325 mg by mouth every evening.    Yes [provider]  furosemide (LASIX) 40 MG tablet Take 1 tablet (40 mg total) by mouth 2 (two) times daily. Patient taking differently: Take 40 mg by mouth daily.  08/29/18 08/29/19 Yes Paticia Stack, MD  ipratropium-albuterol (DUONEB) 0.5-2.5 (3) MG/3ML SOLN Take 3 mLs by nebulization 4 (four) times daily as needed. Patient taking differently: Take 3 mLs by nebulization daily as needed (shortness of breath).  09/19/17  Yes Hoyt Koch, MD  mirtazapine (REMERON) 15 MG tablet TAKE 1 TABLET(15 MG) BY MOUTH AT BEDTIME 06/06/18  Yes Hoyt Koch, MD  Multiple Vitamins-Minerals (MULTIVITAMIN WITH MINERALS) tablet Take 1 tablet by mouth daily. gummie   Yes [provider]  NUTRITIONAL SUPPLEMENT LIQD Take 120 mLs by mouth daily as needed  (hydration). MedPass    Yes [provider]  OXYGEN Inhale 2 L into the lungs daily.    Yes [provider]  pantoprazole (PROTONIX) 40 MG tablet TAKE 1 TABLET(40 MG) BY MOUTH DAILY Patient taking differently: Take 40 mg by mouth daily.  10/08/18  Yes Hoyt Koch, MD  sertraline (ZOLOFT) 25 MG tablet Take 1-2 tablets (25-50 mg total) by mouth daily. Patient taking differently: Take 50 mg by mouth daily.  09/12/18  Yes Hoyt Koch, MD  tolterodine (DETROL LA) 4 MG 24 hr capsule Take 1 capsule (4 mg total) by mouth daily. 06/06/18  Yes Hoyt Koch, MD  atorvastatin (LIPITOR) 40 MG tablet TAKE 1 TABLET(40 MG) BY MOUTH DAILY Patient not taking: Reported on 10/30/2018 09/13/18   Hoyt Koch, MD  furosemide (LASIX) 20 MG tablet TAKE 1 TABLET(20 MG) BY MOUTH DAILY Patient not taking: Reported on 10/30/2018 10/08/18   Pixie Casino, MD  loratadine (CLARITIN) 10 MG tablet TAKE 1 TABLET BY MOUTH AT BEDTIME Patient not taking: TAKE 1 TABLET BY MOUTH AT BEDTIME 09/10/18   Hoyt Koch, MD  mirtazapine (REMERON) 15 MG tablet TAKE 1 TABLET(15 MG) BY MOUTH AT BEDTIME Patient not taking: Reported on 10/30/2018 09/14/18   Hoyt Koch, MD     Critical care time:  53 min    Erick Colace ACNP-BC [place] Pager # [phone] OR # [phone] if no answer

## 2018-10-31 NOTE — Progress Notes (Signed)
On report patients sats dropped to 80s on a nonrebreather. MD paged. Verbal orders for ABG, 60 of lasix IV and stat chest x-ray. Will continue to monitor patient. Patient's family called and updated.

## 2018-10-31 NOTE — Progress Notes (Signed)
ANTICOAGULATION CONSULT NOTE - Initial Consult  Pharmacy Consult for Heparin Indication: atrial fibrillation--Prior apixaban  No Known Allergies  Patient Measurements: Height: 5\' 6"  (167.6 cm) Weight: 163 lb 2.3 oz (74 kg) IBW/kg (Calculated) : 59.3 Heparin Dosing Weight:   Vital Signs: Temp: 97.8 F (36.6 C) (12/04 0429) Temp Source: Oral (12/04 0429) BP: 111/55 (12/04 0624) Pulse Rate: 68 (12/04 0624)  Labs: Recent Labs    10/30/18 1556 10/30/18 1603 10/31/18 0048 10/31/18 0304  HGB 11.9* 12.6  --  11.3*  HCT 36.3 37.0  --  35.8*  PLT 298  --   --  298  APTT  --   --  42*  --   LABPROT  --   --  19.5*  --   INR  --   --  1.67  --   CREATININE 1.04* 0.90  --  0.77  TROPONINI  --   --  0.04*  --     Estimated Creatinine Clearance: 56.8 mL/min (by C-G formula based on SCr of 0.77 mg/dL).   Medical History: Past Medical History:  Diagnosis Date  . AF (atrial fibrillation) ([place])   . Anxiety   . Arthritis    "thighs, hips, arms" (04/28/2015)  . Breast cancer ([place])    right mastectomy  . CAD (coronary artery disease)   . Dehydration 12/06/2016  . Dementia ([place])   . Depression   . Diverticulitis   . GERD (gastroesophageal reflux disease)   . HCAP (healthcare-associated pneumonia) 09/15/2015  . Hyperlipidemia   . Hypertension   . Non-small cell carcinoma of lung, stage 3 ([place]) 04/30/2015  . Prediabetes   . Seasonal allergies   . Shortness of breath dyspnea   . Sleep apnea     dx'd 10/2014,refused cpap machine  . Urinary frequency   . UTI (urinary tract infection) 06/06/2015    Medications:  Infusions:  . sodium chloride Stopped (10/31/18 0100)  . ceFEPime (MAXIPIME) IV    . heparin    . metronidazole Stopped (10/31/18 3903)  . vancomycin      Assessment: Patient with prior apixaban use with last dose 12/4 at 0056.  Patient with afib and MD wants heparin bridge.  Goal of Therapy:  Heparin level 0.3-0.7 units/ml aPTT 66-102 seconds Monitor platelets  by anticoagulation protocol: Yes   Plan:  Heparin drip at 900  Units/hr, starting at 1200 12/4 Daily CBC Next PTT at 2100    Tyler Deis, Shea Stakes [place] 10/31/2018,6:37 AM

## 2018-10-31 NOTE — Procedures (Signed)
Thoracentesis Procedure Note  Pre-operative Diagnosis: right thoracentesis  Post-operative Diagnosis: same  Indications: treatment of dyspnea and evaluation of pleural fluid  Procedure Details  Consent: Informed consent was obtained. Risks of the procedure were discussed including: infection, bleeding, pain, pneumothorax.  Under sterile conditions the patient was positioned. Betadine solution and sterile drapes were utilized.  1% buffered lidocaine was used to anesthetize the  rib space which was identified via real time [place]. Fluid was obtained without any difficulties and minimal blood loss.  A dressing was applied to the wound and wound care instructions were provided.   Findings 2000 ml of cloudy pleural fluid was obtained. A sample was sent to Pathology for cytogenetics, flow, and cell counts, as well as for infection analysis.  Complications:  None; patient tolerated the procedure well.          Condition: stable  Plan A follow up chest x-ray was ordered.  Erick Colace ACNP-BC [place] Pager # [phone] OR # [phone] if no answer

## 2018-10-31 NOTE — Progress Notes (Signed)
CRITICAL VALUE ALERT  Critical Value:  Troponin 0.04  Date & Time Notied:  10/31/18 0235  Provider Notified: Raliegh Ip Schorr  Orders Received/Actions taken: No new orders at this time.

## 2018-10-31 NOTE — H&P (Signed)
History and Physical    Pamela Mcguire DHR:[id number] DOB: [birth year] DOA: 10/30/2018  PCP: Hoyt Koch, MD  Patient coming from: Home.  Chief Complaint: Shortness of breath.  HPI: Pamela Mcguire is a 83 y.o. female with history of diastolic CHF, atrial fibrillation, hyperlipidemia, sleep apnea, COPD presents to the ER because of worsening shortness of breath chest pain.  Patient states she has been having shortness of breath and chest pain for last few days which is acutely worsened.  Had one episode of nausea vomiting following which her symptoms worsen.  Chest pain is retrosternal nonradiating present even at rest.  Has been having some productive cough.  ED Course: In the ER patient was hypotensive tachycardic febrile with fever of 102 F.  Patient was started on sepsis protocol fluid bolus and started on empiric antibiotics.  Patient had CT scan of the chest and abdomen which showed large right-sided pleural effusion.  Blood cultures were obtained.  UA was unremarkable.  And admitted for SIRS source not clear.  Review of Systems: As per HPI, rest all negative.   Past Medical History:  Diagnosis Date  . AF (atrial fibrillation) ([place])   . Anxiety   . Arthritis    "thighs, hips, arms" (04/28/2015)  . Breast cancer ([place])    right mastectomy  . CAD (coronary artery disease)   . Dehydration 12/06/2016  . Dementia ([place])   . Depression   . Diverticulitis   . GERD (gastroesophageal reflux disease)   . HCAP (healthcare-associated pneumonia) 09/15/2015  . Hyperlipidemia   . Hypertension   . Non-small cell carcinoma of lung, stage 3 ([place]) 04/30/2015  . Prediabetes   . Seasonal allergies   . Shortness of breath dyspnea   . Sleep apnea     dx'd 10/2014,refused cpap machine  . Urinary frequency   . UTI (urinary tract infection) 06/06/2015    Past Surgical History:  Procedure Laterality Date  . BREAST SURGERY    . CARDIOVERSION    . Cataract surgery    . CESAREAN SECTION  X 2  .  COLON SURGERY    . COLONOSCOPY    . COLONOSCOPY N/A 01/13/2017   Procedure: COLONOSCOPY;  Surgeon: Gatha Mayer, MD;  Location: [facility];  Service: Endoscopy;  Laterality: N/A;  . ESOPHAGOGASTRODUODENOSCOPY N/A 01/13/2017   Procedure: ESOPHAGOGASTRODUODENOSCOPY (EGD);  Surgeon: Gatha Mayer, MD;  Location: [facility];  Service: Endoscopy;  Laterality: N/A;  . LAPAROSCOPIC PARTIAL COLECTOMY N/A 02/08/2017   Procedure: LAPAROSCOPIC PARTIAL COLECTOMY;  Surgeon: Leighton Ruff, MD;  Location: [facility] ORS;  Service: General;  Laterality: N/A;  . MASTECTOMY Right   . MEDIASTINOSCOPY N/A 04/28/2015   Procedure: MEDIASTINOSCOPY;  Surgeon: [name], MD;  Location: [facility] OR;  Service: Thoracic;  Laterality: N/A;  . TONSILLECTOMY       reports that she quit smoking about 25 years ago. Her smoking use included cigarettes. She has a 35.00 pack-year smoking history. She has never used smokeless tobacco. She reports that she drinks about 9.0 standard drinks of alcohol per week. She reports that she does not use drugs.  No Known Allergies  Family History  Problem Relation Age of Onset  . Mental illness Mother     Prior to Admission medications   Medication Sig Start Date End Date Taking? Authorizing Provider  albuterol (PROVENTIL HFA;VENTOLIN HFA) 108 (90 Base) MCG/ACT inhaler Inhale 1 puff into the lungs every 6 (six) hours as needed for wheezing or shortness of breath. Reported on  06/01/2016 11/15/16  Yes Golden Circle, FNP  apixaban (ELIQUIS) 5 MG TABS tablet Take 1 tablet (5 mg total) by mouth 2 (two) times daily. 10/08/18  Yes Pixie Casino, MD  atorvastatin (LIPITOR) 40 MG tablet TAKE 1 TABLET(40 MG) BY MOUTH DAILY 06/06/18  Yes Hoyt Koch, MD  DIGOX 125 MCG tablet TAKE 1 TABLET(0.125 MG) BY MOUTH DAILY Patient taking differently: Take 0.125 mg by mouth daily.  02/12/18  Yes Hilty, Nadean Corwin, MD  diltiazem (CARDIZEM CD) 240 MG 24 hr capsule Take 1 capsule (240 mg total) by mouth  daily. 09/11/18  Yes Hilty, Nadean Corwin, MD  ferrous sulfate 325 (65 FE) MG tablet Take 325 mg by mouth every evening.    Yes [provider]  furosemide (LASIX) 40 MG tablet Take 1 tablet (40 mg total) by mouth 2 (two) times daily. Patient taking differently: Take 40 mg by mouth daily.  08/29/18 08/29/19 Yes Paticia Stack, MD  ipratropium-albuterol (DUONEB) 0.5-2.5 (3) MG/3ML SOLN Take 3 mLs by nebulization 4 (four) times daily as needed. Patient taking differently: Take 3 mLs by nebulization daily as needed (shortness of breath).  09/19/17  Yes Hoyt Koch, MD  mirtazapine (REMERON) 15 MG tablet TAKE 1 TABLET(15 MG) BY MOUTH AT BEDTIME 06/06/18  Yes Hoyt Koch, MD  Multiple Vitamins-Minerals (MULTIVITAMIN WITH MINERALS) tablet Take 1 tablet by mouth daily. gummie   Yes [provider]  NUTRITIONAL SUPPLEMENT LIQD Take 120 mLs by mouth daily as needed (hydration). Med[employer]    Yes [provider]  OXYGEN Inhale 2 L into the lungs daily.    Yes [provider]  pantoprazole (PROTONIX) 40 MG tablet TAKE 1 TABLET(40 MG) BY MOUTH DAILY Patient taking differently: Take 40 mg by mouth daily.  10/08/18  Yes Hoyt Koch, MD  sertraline (ZOLOFT) 25 MG tablet Take 1-2 tablets (25-50 mg total) by mouth daily. Patient taking differently: Take 50 mg by mouth daily.  09/12/18  Yes Hoyt Koch, MD  tolterodine (DETROL LA) 4 MG 24 hr capsule Take 1 capsule (4 mg total) by mouth daily. 06/06/18  Yes Hoyt Koch, MD  atorvastatin (LIPITOR) 40 MG tablet TAKE 1 TABLET(40 MG) BY MOUTH DAILY Patient not taking: Reported on 10/30/2018 09/13/18   Hoyt Koch, MD  furosemide (LASIX) 20 MG tablet TAKE 1 TABLET(20 MG) BY MOUTH DAILY Patient not taking: Reported on 10/30/2018 10/08/18   Pixie Casino, MD  loratadine (CLARITIN) 10 MG tablet TAKE 1 TABLET BY MOUTH AT BEDTIME Patient not taking: TAKE 1 TABLET BY MOUTH AT BEDTIME 09/10/18    Hoyt Koch, MD  mirtazapine (REMERON) 15 MG tablet TAKE 1 TABLET(15 MG) BY MOUTH AT BEDTIME Patient not taking: Reported on 10/30/2018 09/14/18   Hoyt Koch, MD    Physical Exam: Vitals:   10/30/18 2200 10/30/18 2230 10/30/18 2337 10/31/18 0010  BP: (!) 117/56 (!) 117/55 (!) 140/52   Pulse: 84 71 91   Resp: (!) 21 (!) 38 (!) 29   Temp:    98 F (36.7 C)  TempSrc:    Oral  SpO2: 91% 95% 96%   Weight:      Height:          Constitutional: Moderately built and nourished. Vitals:   10/30/18 2200 10/30/18 2230 10/30/18 2337 10/31/18 0010  BP: (!) 117/56 (!) 117/55 (!) 140/52   Pulse: 84 71 91   Resp: (!) 21 (!) 38 (!) 29  Temp:    98 F (36.7 C)  TempSrc:    Oral  SpO2: 91% 95% 96%   Weight:      Height:       Eyes: Anicteric no pallor. ENMT: No discharge from the ears eyes nose or mouth. Neck: No mass felt.  No neck rigidity.  No JVD appreciated. Respiratory: Bilateral expiratory wheeze and no crepitations. Cardiovascular: S1-S2 heard no murmurs appreciated. Abdomen: Soft nontender bowel sounds present. Musculoskeletal: No edema. Skin: No rash. Neurologic: Alert awake oriented to time place and person.  Moves all extremities. Psychiatric: Appears normal.   Labs on Admission: I have personally reviewed following labs and imaging studies  CBC: Recent Labs  Lab 10/30/18 1556 10/30/18 1603  WBC 16.0*  --   NEUTROABS 14.6*  --   HGB 11.9* 12.6  HCT 36.3 37.0  MCV 93.6  --   PLT 298  --    Basic Metabolic Panel: Recent Labs  Lab 10/30/18 1556 10/30/18 1603  NA 135 134*  K 3.0* 3.0*  CL 92* 89*  CO2 32  --   GLUCOSE 162* 161*  BUN 13 12  CREATININE 1.04* 0.90  CALCIUM 8.0*  --    GFR: Estimated Creatinine Clearance: 45.9 mL/min (by C-G formula based on SCr of 0.9 mg/dL). Liver Function Tests: Recent Labs  Lab 10/30/18 1556  AST 23  ALT 15  ALKPHOS 54  BILITOT 1.3*  PROT 6.2*  ALBUMIN 3.1*   Recent Labs  Lab  10/30/18 1556  LIPASE 28   No results for input(s): AMMONIA in the last 168 hours. Coagulation Profile: No results for input(s): INR, PROTIME in the last 168 hours. Cardiac Enzymes: No results for input(s): CKTOTAL, CKMB, CKMBINDEX, TROPONINI in the last 168 hours. BNP (last 3 results) Recent Labs    09/12/18 1130  PROBNP 175.0*   HbA1C: No results for input(s): HGBA1C in the last 72 hours. CBG: No results for input(s): GLUCAP in the last 168 hours. Lipid Profile: No results for input(s): CHOL, HDL, LDLCALC, TRIG, CHOLHDL, LDLDIRECT in the last 72 hours. Thyroid Function Tests: No results for input(s): TSH, T4TOTAL, FREET4, T3FREE, THYROIDAB in the last 72 hours. Anemia Panel: No results for input(s): VITAMINB12, FOLATE, FERRITIN, TIBC, IRON, RETICCTPCT in the last 72 hours. Urine analysis:    Component Value Date/Time   COLORURINE YELLOW 10/30/2018 1556   APPEARANCEUR CLEAR 10/30/2018 1556   LABSPEC 1.019 10/30/2018 1556   LABSPEC 1.010 06/02/2015 1706   PHURINE 5.0 10/30/2018 1556   GLUCOSEU NEGATIVE 10/30/2018 1556   GLUCOSEU NEGATIVE 07/10/2017 1633   GLUCOSEU Negative 06/02/2015 1706   HGBUR SMALL (A) 10/30/2018 1556   BILIRUBINUR NEGATIVE 10/30/2018 1556   BILIRUBINUR 3+      4mg /dl 09/08/2016 1133   KETONESUR NEGATIVE 10/30/2018 1556   PROTEINUR NEGATIVE 10/30/2018 1556   UROBILINOGEN 0.2 07/10/2017 1633   UROBILINOGEN 0.2 06/02/2015 1706   NITRITE NEGATIVE 10/30/2018 1556   LEUKOCYTESUR NEGATIVE 10/30/2018 1556   LEUKOCYTESUR Moderate 06/02/2015 1706   Sepsis Labs: @LABRCNTIP (procalcitonin:4,lacticidven:4) )No results found for this or any previous visit (from the past 240 hour(s)).   Radiological Exams on Admission: Dg Chest 2 View  Result Date: 10/30/2018 CLINICAL DATA:  Cough and shortness of breath EXAM: CHEST - 2 VIEW COMPARISON:  08/29/2018 FINDINGS: Cardiac shadow is enlarged but stable. Aortic calcifications are again seen. Elevation of the right  hemidiaphragm is again noted consistent with prior volume loss. Increased density is noted in the right paratracheal region stable from  the previous exam. Patchy atelectatic changes are noted in the bases bilaterally. No sizable effusion is noted. No bony abnormality is seen. IMPRESSION: Patchy bibasilar atelectasis superimposed over more chronic changes with elevation of the right hemidiaphragm. Electronically Signed   By: Inez Catalina M.D.   On: 10/30/2018 16:23   Ct Angio Chest Pe W And/or Wo Contrast  Result Date: 10/30/2018 CLINICAL DATA:  Chest pain and dyspnea since yesterday productive cough. History of breast cancer, stage III non-small cell cancer, mastectomy and cholecystectomy EXAM: CT ANGIOGRAPHY CHEST CT ABDOMEN AND PELVIS WITH CONTRAST TECHNIQUE: Multidetector CT imaging of the chest was performed using the standard protocol during bolus administration of intravenous contrast. Multiplanar CT image reconstructions and MIPs were obtained to evaluate the vascular anatomy. Multidetector CT imaging of the abdomen and pelvis was performed using the standard protocol during bolus administration of intravenous contrast. CONTRAST:  129mL ISOVUE-370 IOPAMIDOL (ISOVUE-370) INJECTION 76% COMPARISON:  07/04/2017 CT chest, abdomen and pelvis. FINDINGS: CTA CHEST FINDINGS Cardiovascular: Satisfactory opacification of the pulmonary arteries to the segmental level. No evidence of pulmonary embolism. Conventional branch pattern of the great vessels with atherosclerosis of the great vessels. Moderate atheromatous calcification of the thoracic aorta without dissection or aneurysm. Left main and three-vessel coronary arteriosclerosis is identified. No pericardial effusion thickening. Mediastinum/Nodes: The previously noted subcarinal adenopathy is obscured by presence of a large right pleural effusion. Mildly enlarged prevascular lymph nodes are identified since prior measuring up to 11 mm short axis. 10 mm right  upper paratracheal lymph node is also noted. No definite hilar adenopathy. The right hilum however is obscured by atelectasis and pleural fluid. No thyromegaly. Cystic nodules are noted in the lower poles of the thyroid gland measuring up to 1 cm each. The trachea is midline and patent. The mainstem bronchi appear patent. The esophagus is unremarkable. Lungs/Pleura: Large right effusion with compressive atelectasis, new since prior. These findings obscure the pulmonary nodularity noted at the right lung base on prior exam. New ill-defined opacity adjacent to the right heart border is nonspecific but may also represent focal atelectasis and/or scarring. Nodule is not entirely excluded, series 93, measuring 16 mm. Short-term interval follow-up is recommended. Redemonstration of subpleural reticular opacities bilaterally with superimposed mild diffuse interstitial edema noted. Pulmonary nodule at the right lung base is respiratory motion artifacts limit assessment. No pneumothorax. No significant pleural effusion on the left. Musculoskeletal: Mild multilevel thoracic spondylosis disc space narrowing and endplate spurring since our prior. No aggressive osseous lesions. Right mastectomy with associated right axillary lymph node dissection. Review of the MIP images confirms the above findings. CT ABDOMEN and PELVIS FINDINGS Hepatobiliary: No focal liver abnormality is seen. Status post cholecystectomy. Slight reservoir effect status post cholecystectomy believed to account for the slight intrahepatic ductal dilatation of the left lobe. Findings are similar to prior however. Pancreas: Unremarkable. No pancreatic ductal dilatation or surrounding inflammatory changes. Spleen: Small splenule along the caudal aspect. The spleen abuts the slightly enlarged and elongated left hepatic lobe the subtle hypodensity in the spleen is less apparent and may have represented a small hemangioma or partial volume averaging.  Adrenals/Urinary Tract: Stable hypodense left adrenal nodule measuring 7 mm. The right adrenal gland is normal. Stable bilateral renal cysts, the largest on right measuring 3.7 cm versus 3.5 cm previously. Smaller cysts are noted bilaterally too small further characterize. No nephrolithiasis or hydronephrosis. The urinary bladder is unremarkable for the degree of distention. Stomach/Bowel: Decompressed stomach. Stable duodenal diverticulum off the distal second portion. Normal  small bowel rotation and ligament Treitz position partial right colectomy. Colonic diverticulosis along the descending and sigmoid colon without acute diverticulitis. Probable large diverticulum off the descending colon given similar appearance, series 11/63. Vascular/Lymphatic: Moderate aortoiliac atherosclerosis without aneurysm. No lymphadenopathy. Reproductive: Uterus and bilateral adnexa are unremarkable. Mild engorgement of left periuterine and gonadal veins. Other: Small bilateral fat containing inguinal hernias. No abdominopelvic ascites. Musculoskeletal: Chronic stable moderate compression deformity of L1 with 50% loss. No aggressive or suspicious osseous lesions. Review of the MIP images confirms the above findings. IMPRESSION: Chest CT: 1. Moderate to large right pleural effusion with adjacent compressive atelectasis. 2. Chronic stable subpleural reticulations within both lungs. 16 mm rounded opacity adjacent to the right heart border may represent a focus of rounded atelectasis. As a pulmonary nodule is not entirely excluded, consider short-term interval follow-up or PET-CT. 3. No acute pulmonary embolus. 4. Coronary arteriosclerosis and aortic atherosclerosis. CT AP: 1. Stable subcentimeter left adrenal nodule measuring 7 mm. 2. Descending and sigmoid diverticulosis without acute diverticulitis. 3. Status post partial right colectomy without complicating features. No bowel obstruction or inflammation. 4. Thoracolumbar spondylosis  with chronic moderate compression deformity of L1. Electronically Signed   By: Ashley Royalty M.D.   On: 10/30/2018 19:42   Ct Abdomen Pelvis W Contrast  Result Date: 10/30/2018 CLINICAL DATA:  Chest pain and dyspnea since yesterday productive cough. History of breast cancer, stage III non-small cell cancer, mastectomy and cholecystectomy EXAM: CT ANGIOGRAPHY CHEST CT ABDOMEN AND PELVIS WITH CONTRAST TECHNIQUE: Multidetector CT imaging of the chest was performed using the standard protocol during bolus administration of intravenous contrast. Multiplanar CT image reconstructions and MIPs were obtained to evaluate the vascular anatomy. Multidetector CT imaging of the abdomen and pelvis was performed using the standard protocol during bolus administration of intravenous contrast. CONTRAST:  116mL ISOVUE-370 IOPAMIDOL (ISOVUE-370) INJECTION 76% COMPARISON:  07/04/2017 CT chest, abdomen and pelvis. FINDINGS: CTA CHEST FINDINGS Cardiovascular: Satisfactory opacification of the pulmonary arteries to the segmental level. No evidence of pulmonary embolism. Conventional branch pattern of the great vessels with atherosclerosis of the great vessels. Moderate atheromatous calcification of the thoracic aorta without dissection or aneurysm. Left main and three-vessel coronary arteriosclerosis is identified. No pericardial effusion thickening. Mediastinum/Nodes: The previously noted subcarinal adenopathy is obscured by presence of a large right pleural effusion. Mildly enlarged prevascular lymph nodes are identified since prior measuring up to 11 mm short axis. 10 mm right upper paratracheal lymph node is also noted. No definite hilar adenopathy. The right hilum however is obscured by atelectasis and pleural fluid. No thyromegaly. Cystic nodules are noted in the lower poles of the thyroid gland measuring up to 1 cm each. The trachea is midline and patent. The mainstem bronchi appear patent. The esophagus is unremarkable.  Lungs/Pleura: Large right effusion with compressive atelectasis, new since prior. These findings obscure the pulmonary nodularity noted at the right lung base on prior exam. New ill-defined opacity adjacent to the right heart border is nonspecific but may also represent focal atelectasis and/or scarring. Nodule is not entirely excluded, series 93, measuring 16 mm. Short-term interval follow-up is recommended. Redemonstration of subpleural reticular opacities bilaterally with superimposed mild diffuse interstitial edema noted. Pulmonary nodule at the right lung base is respiratory motion artifacts limit assessment. No pneumothorax. No significant pleural effusion on the left. Musculoskeletal: Mild multilevel thoracic spondylosis disc space narrowing and endplate spurring since our prior. No aggressive osseous lesions. Right mastectomy with associated right axillary lymph node dissection. Review of the MIP  images confirms the above findings. CT ABDOMEN and PELVIS FINDINGS Hepatobiliary: No focal liver abnormality is seen. Status post cholecystectomy. Slight reservoir effect status post cholecystectomy believed to account for the slight intrahepatic ductal dilatation of the left lobe. Findings are similar to prior however. Pancreas: Unremarkable. No pancreatic ductal dilatation or surrounding inflammatory changes. Spleen: Small splenule along the caudal aspect. The spleen abuts the slightly enlarged and elongated left hepatic lobe the subtle hypodensity in the spleen is less apparent and may have represented a small hemangioma or partial volume averaging. Adrenals/Urinary Tract: Stable hypodense left adrenal nodule measuring 7 mm. The right adrenal gland is normal. Stable bilateral renal cysts, the largest on right measuring 3.7 cm versus 3.5 cm previously. Smaller cysts are noted bilaterally too small further characterize. No nephrolithiasis or hydronephrosis. The urinary bladder is unremarkable for the degree of  distention. Stomach/Bowel: Decompressed stomach. Stable duodenal diverticulum off the distal second portion. Normal small bowel rotation and ligament Treitz position partial right colectomy. Colonic diverticulosis along the descending and sigmoid colon without acute diverticulitis. Probable large diverticulum off the descending colon given similar appearance, series 11/63. Vascular/Lymphatic: Moderate aortoiliac atherosclerosis without aneurysm. No lymphadenopathy. Reproductive: Uterus and bilateral adnexa are unremarkable. Mild engorgement of left periuterine and gonadal veins. Other: Small bilateral fat containing inguinal hernias. No abdominopelvic ascites. Musculoskeletal: Chronic stable moderate compression deformity of L1 with 50% loss. No aggressive or suspicious osseous lesions. Review of the MIP images confirms the above findings. IMPRESSION: Chest CT: 1. Moderate to large right pleural effusion with adjacent compressive atelectasis. 2. Chronic stable subpleural reticulations within both lungs. 16 mm rounded opacity adjacent to the right heart border may represent a focus of rounded atelectasis. As a pulmonary nodule is not entirely excluded, consider short-term interval follow-up or PET-CT. 3. No acute pulmonary embolus. 4. Coronary arteriosclerosis and aortic atherosclerosis. CT AP: 1. Stable subcentimeter left adrenal nodule measuring 7 mm. 2. Descending and sigmoid diverticulosis without acute diverticulitis. 3. Status post partial right colectomy without complicating features. No bowel obstruction or inflammation. 4. Thoracolumbar spondylosis with chronic moderate compression deformity of L1. Electronically Signed   By: Ashley Royalty M.D.   On: 10/30/2018 19:42    EKG: Independently reviewed.  Normal sinus rhythm with nonspecific ST changes.  Assessment/Plan Principal Problem:   SIRS (systemic inflammatory response syndrome) ([facility]) Active Problems:   Paroxysmal a-fib ([facility])   Essential  hypertension   Severe obstructive sleep apnea   Adenocarcinoma of upper lobe of right lung   Breast cancer ([facility])   CAD (coronary artery disease)   COPD exacerbation ([facility])   Chronic respiratory failure with hypoxia ([place]), 2L home O2    1. SIRS -source not clear.  Patient has been having productive cough after admission.  Will check blood cultures sputum cultures influenza PCR urine cultures continue with empiric antibiotics.  CAT scan does show large pleural effusion and has had previous recurrent effusions.  Effusions were attributed to CHF previously.  Will need thoracentesis.  May need to hold her Eliquis and bridge with heparin before thoracentesis. 2. Paroxysmal atrial fibrillation on digoxin and Cardizem.  Will hold Eliquis and keep patient on heparin bridging before possible need for thoracentesis. 3. History of diastolic CHF last EF measured in September 2019 was 60 to 65%.  Holding Lasix for now since patient was hypotensive and had sepsis picture. 4. Chest pain -we will cycle cardiac markers.  Morphine for pain relief. 5. COPD exacerbation -on nebulizer treatment and Pulmicort. 6. History of lung cancer,  breast cancer and colon cancer in remission.   DVT prophylaxis: We will keep patient on heparin. Code Status: DO NOT INTUBATE. Family Communication: No family at the bedside. Disposition Plan: Home. Consults called: None. Admission status: Inpatient.   Rise Patience MD [facility] Pager [phone].  If 7PM-7AM, please contact night-coverage [email or web address] Password [email or web address]  10/31/2018, 12:19 AM

## 2018-10-31 NOTE — Progress Notes (Signed)
Pharmacy Antibiotic Note  Pamela Mcguire is a 83 y.o. female admitted on 10/30/2018 with sepsis.  Pharmacy has been consulted for Vancomycin, cefepime dosing.  Plan: Vancomycin 1250mg  iv x1, then 1gm iv q24hr Cefepime 2gm iv q8hr  Goal AUC = 400 - 500 for all indications, except meningitis (goal AUC > 500 and Cmin 15-20 mcg/mL)   Height: 5\' 6"  (167.6 cm) Weight: 163 lb 2.3 oz (74 kg) IBW/kg (Calculated) : 59.3  Temp (24hrs), Avg:99 F (37.2 C), Min:97.8 F (36.6 C), Max:102 F (38.9 C)  Recent Labs  Lab 10/30/18 1556 10/30/18 1603 10/30/18 1814 10/31/18 0057 10/31/18 0304  WBC 16.0*  --   --   --  14.2*  CREATININE 1.04* 0.90  --   --  0.77  LATICACIDVEN  --  4.05* 2.10* 1.6 1.4    Estimated Creatinine Clearance: 56.8 mL/min (by C-G formula based on SCr of 0.77 mg/dL).    No Known Allergies  Antimicrobials this admission: Vancomycin 10/30/2018 >> Cefepime 10/31/2018 >>  Flagyl 10/31/2018 >>  Dose adjustments this admission: -  Microbiology results: -  Thank you for allowing pharmacy to be a part of this patient's care.  Nani Skillern Crowford 10/31/2018 6:36 AM

## 2018-10-31 NOTE — Progress Notes (Signed)
Patient ID: Pamela Mcguire, female   DOB: February 12, 1935, 83 y.o.   MRN: [id number] Patient was admitted early this morning for shortness of breath and was found to be hypotensive, tachycardic and febrile.  She was started on broad-spectrum antibiotics and IV fluids.  Patient seen and examined at bedside.  She is more tachypneic and currently on nonrebreather with saturations in the 80s.  ABG shows PO2 and PCO2 in the 50s.  Chest x-ray has been ordered.  Stop IV fluids.  Blood pressure is better.  Lasix 60 mg IV x1 dose stat.  Solu-Medrol 125 mg x 1 IV.  Spoke to [facility] regarding PCCM consult.  Patient might need thoracentesis.  We will continue IV antibiotics for today and reevaluate in a.m.  Repeat a.m. labs.

## 2018-10-31 NOTE — Progress Notes (Signed)
Pt off BIPAP. No distress noted at this time.

## 2018-10-31 NOTE — Telephone Encounter (Signed)
MM PAL 12/10 - moved f/u to 12/13. Spoke with dtr. Per dtr patient currently in hospital, is not doing well and has already has both lab/ct. Per dtr she was about to call due to seeing appointment change (2nd time) and patient being in hosp. 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 Per dtr 12/13 f/u moved to 12/18.   Lab/ct appointments cancelled. Message routed to MM re patient's condition.

## 2018-11-01 DIAGNOSIS — C3411 Malignant neoplasm of upper lobe, right bronchus or lung: Secondary | ICD-10-CM

## 2018-11-01 DIAGNOSIS — I480 Paroxysmal atrial fibrillation: Secondary | ICD-10-CM

## 2018-11-01 DIAGNOSIS — Z515 Encounter for palliative care: Secondary | ICD-10-CM

## 2018-11-01 DIAGNOSIS — Z66 Do not resuscitate: Secondary | ICD-10-CM

## 2018-11-01 DIAGNOSIS — R0602 Shortness of breath: Secondary | ICD-10-CM

## 2018-11-01 DIAGNOSIS — I10 Essential (primary) hypertension: Secondary | ICD-10-CM

## 2018-11-01 DIAGNOSIS — J189 Pneumonia, unspecified organism: Secondary | ICD-10-CM

## 2018-11-01 LAB — URINE CULTURE: Culture: <10000 — AB

## 2018-11-01 LAB — STREP PNEUMONIAE URINARY ANTIGEN: Strep Pneumo Urinary Antigen: NEGATIVE

## 2018-11-01 LAB — TRIGLYCERIDES, BODY FLUIDS: Triglycerides, Fluid: 29 mg/dL

## 2018-11-01 LAB — CBC
HCT: 31.7 % — ABNORMAL LOW (ref 36.0–46.0)
Hemoglobin: 10.1 g/dL — ABNORMAL LOW (ref 12.0–15.0)
MCH: 30.6 pg (ref 26.0–34.0)
MCHC: 31.9 g/dL (ref 30.0–36.0)
MCV: 96.1 fL (ref 80.0–100.0)
Platelets: 290 10*3/uL (ref 150–400)
RBC: 3.3 MIL/uL — ABNORMAL LOW (ref 3.87–5.11)
RDW: 14.1 % (ref 11.5–15.5)
WBC: 11 10*3/uL — ABNORMAL HIGH (ref 4.0–10.5)
nRBC: 0 % (ref 0.0–0.2)

## 2018-11-01 LAB — BODY FLUID CELL COUNT WITH DIFFERENTIAL
Lymphs, Fluid: 52 %
Monocyte-Macrophage-Serous Fluid: 11 % — ABNORMAL LOW (ref 50–90)
Neutrophil Count, Fluid: 37 % — ABNORMAL HIGH (ref 0–25)
Total Nucleated Cell Count, Fluid: 1969 cu mm — ABNORMAL HIGH (ref 0–1000)

## 2018-11-01 LAB — PATHOLOGIST SMEAR REVIEW

## 2018-11-01 LAB — PROCALCITONIN: Procalcitonin: <0.1 ng/mL

## 2018-11-01 LAB — APTT: aPTT: 59 seconds — ABNORMAL HIGH (ref 24–36)

## 2018-11-01 MED ORDER — MORPHINE SULFATE (PF) 2 MG/ML IV SOLN
1.0000 mg | INTRAVENOUS | Status: DC | PRN
  Administered 2018-11-01 – 2018-11-10 (×4): 1 mg via INTRAVENOUS
  Filled 2018-11-01 (×5): qty 1

## 2018-11-01 MED ORDER — MAGNESIUM SULFATE 4 GM/100ML IV SOLN
4.0000 g | Freq: Once | INTRAVENOUS | Status: AC
  Administered 2018-11-01: 4 g via INTRAVENOUS
  Filled 2018-11-01: qty 100

## 2018-11-01 MED ORDER — HALOPERIDOL LACTATE 5 MG/ML IJ SOLN: 2.5000 mg | Freq: Four times a day (QID) | INTRAMUSCULAR | Status: DC | PRN

## 2018-11-01 MED ORDER — GUAIFENESIN-DM 100-10 MG/5ML PO SYRP
5.0000 mL | ORAL_SOLUTION | ORAL | Status: DC | PRN
  Administered 2018-11-01: 5 mL via ORAL
  Filled 2018-11-01: qty 10

## 2018-11-01 MED ORDER — APIXABAN 5 MG PO TABS
5.0000 mg | ORAL_TABLET | Freq: Two times a day (BID) | ORAL | Status: DC
  Administered 2018-11-01 – 2018-11-06 (×11): 5 mg via ORAL
  Filled 2018-11-01 (×11): qty 1

## 2018-11-01 MED ORDER — POTASSIUM CHLORIDE CRYS ER 20 MEQ PO TBCR
40.0000 meq | EXTENDED_RELEASE_TABLET | ORAL | Status: DC
  Administered 2018-11-01: 40 meq via ORAL
  Filled 2018-11-01: qty 2

## 2018-11-01 MED ORDER — POTASSIUM CHLORIDE 20 MEQ/15ML (10%) PO SOLN
40.0000 meq | ORAL | Status: AC
  Administered 2018-11-01 (×2): 40 meq via ORAL
  Filled 2018-11-01 (×2): qty 30

## 2018-11-01 MED ORDER — FUROSEMIDE 10 MG/ML IJ SOLN
80.0000 mg | Freq: Two times a day (BID) | INTRAMUSCULAR | Status: DC
  Administered 2018-11-01 – 2018-11-02 (×2): 80 mg via INTRAVENOUS
  Filled 2018-11-01 (×2): qty 8

## 2018-11-01 MED ORDER — MAGNESIUM SULFATE 2 GM/50ML IV SOLN
2.0000 g | Freq: Once | INTRAVENOUS | Status: AC
  Administered 2018-11-01: 2 g via INTRAVENOUS
  Filled 2018-11-01: qty 50

## 2018-11-01 NOTE — Progress Notes (Signed)
ANTICOAGULATION CONSULT NOTE - Follow Up Consult  Pharmacy Consult for Heparin, resume Apixaban Indication: atrial fibrillation  Allergies  Allergen Reactions  . Influenza Vaccines Anaphylaxis    Patient allergic to eggs  . Eggs Or Egg-Derived Products Nausea And Vomiting    Patient Measurements: Height: 5\' 6"  (167.6 cm) Weight: 157 lb 3 oz (71.3 kg) IBW/kg (Calculated) : 59.3 Heparin Dosing Weight:   Vital Signs: Temp: 98.3 F (36.8 C) (12/05 0800) Temp Source: Oral (12/05 0800) BP: 126/60 (12/05 0600) Pulse Rate: 94 (12/05 0600)  Labs: Recent Labs    10/30/18 1556 10/30/18 1603 10/31/18 0048 10/31/18 0304 10/31/18 0606 10/31/18 1215 10/31/18 1554 11/01/18 0008 11/01/18 0500  HGB 11.9* 12.6  --  11.3*  --   --   --   --  10.1*  HCT 36.3 37.0  --  35.8*  --   --   --   --  31.7*  PLT 298  --   --  298  --   --   --   --  290  APTT  --   --  42*  --   --   --   --  59*  --   LABPROT  --   --  19.5*  --   --   --   --   --   --   INR  --   --  1.67  --   --   --   --   --   --   CREATININE 1.04* 0.90  --  0.77  --   --  0.92  --   --   TROPONINI  --   --  0.04*  --  0.04* 0.05*  --   --   --     Estimated Creatinine Clearance: 48.5 mL/min (by C-G formula based on SCr of 0.92 mg/dL).   Medications:  Infusions:  . ceFEPime (MAXIPIME) IV Stopped (11/01/18 0617)  . heparin 1,000 Units/hr (11/01/18 0110)  . magnesium sulfate 1 - 4 g bolus IVPB 2 g (11/01/18 2229)    Assessment: 83 yo female on chronic apixaban for history of afib.  Transitioned to IV heparin on admit for thoracentesis on 12/4. To resume apixaban today.   SCr 0.92, CrCl ~48 ml/min  Weight > 60kg  Age > 80  CBC: Hgb 10.1, Plts 290  No bleeding reported  Goal of Therapy:  Therapeutic anticoagulation, prevention of VTE/stroke   Plan:  Stop IV heparin Resume Apixaban 5mg  PO bid as taking PTA (meets criteria for standard dose) No dose adjustments needed, Pharmacy will sign-off  Peggyann Juba, PharmD, BCPS Pager: [phone] 11/01/2018,8:55 AM

## 2018-11-01 NOTE — Progress Notes (Signed)
ANTICOAGULATION CONSULT NOTE - Follow Up Consult  Pharmacy Consult for Heparin Indication: atrial fibrillation--Prior apixaban  Allergies  Allergen Reactions  . Influenza Vaccines Anaphylaxis    Patient allergic to eggs  . Eggs Or Egg-Derived Products Nausea And Vomiting    Patient Measurements: Height: 5\' 6"  (167.6 cm) Weight: 163 lb 2.3 oz (74 kg) IBW/kg (Calculated) : 59.3 Heparin Dosing Weight:   Vital Signs: Temp: 98.9 F (37.2 C) (12/05 0000) Temp Source: Oral (12/05 0000) BP: 105/77 (12/05 0000) Pulse Rate: 92 (12/05 0000)  Labs: Recent Labs    10/30/18 1556 10/30/18 1603 10/31/18 0048 10/31/18 0304 10/31/18 0606 10/31/18 1215 10/31/18 1554 11/01/18 0008  HGB 11.9* 12.6  --  11.3*  --   --   --   --   HCT 36.3 37.0  --  35.8*  --   --   --   --   PLT 298  --   --  298  --   --   --   --   APTT  --   --  42*  --   --   --   --  59*  LABPROT  --   --  19.5*  --   --   --   --   --   INR  --   --  1.67  --   --   --   --   --   CREATININE 1.04* 0.90  --  0.77  --   --  0.92  --   TROPONINI  --   --  0.04*  --  0.04* 0.05*  --   --     Estimated Creatinine Clearance: 49.4 mL/min (by C-G formula based on SCr of 0.92 mg/dL).   Medications:  Infusions:  . ceFEPime (MAXIPIME) IV 2 g (10/31/18 2343)  . heparin 900 Units/hr (10/31/18 2000)    Assessment: Patient with low PTT.  No heparin issues per RN.  PTT ordered with Heparin level until both correlate due to possible drug-lab interaction between oral anticoagulant (rivaroxaban, edoxaban, or apixaban) and anti-Xa level (aka heparin level)   Goal of Therapy:  Heparin level 0.3-0.7 units/ml aPTT 66-102 seconds Monitor platelets by anticoagulation protocol: Yes   Plan:  Increase heparin to 1000 units/hr Recheck PTT/heparin level at [place], Khaya Theissen [place] 11/01/2018,1:08 AM

## 2018-11-01 NOTE — Consult Note (Signed)
   NAME:  Pamela Mcguire, MRN:  [id number], DOB:  05-21-1935, LOS: 2 ADMISSION DATE:  10/30/2018, CONSULTATION DATE:  12/4 REFERRING MD:  [name], CHIEF COMPLAINT:  Acute hypoxic respiratory failure    Brief History   83 year old woman with stage IIIa adenocarcinoma status post right upper lobectomy and chemoradiation who had a recurrent right pleural effusion that was tapped twice in 05/2018 and again in 07/2018 with negative cytology.  She is also a colon cancer survivor and has a history of chronic atrial fibrillation and diastolic heart failure. She was admitted 12/3 with shortness of breath, fever and hypoxia, CT chest showing large right effusion, chest x-ray on 12/5 shows complete right hemi-opacity with no mediastinal shift suggesting progression of effusion with some degree of collapse, also left upper lobe markings suggesting of edema. She required BiPAP transiently  Significant Hospital Events   12/4 admitted on 12/4 w/ working dx of large right effusion, possible post-obstruction atelectasis vs PNA. Was getting IVFs and abx. PCCM consulted for acute worsening of respiratory failure. Bed side thoracentesis completed. 2 liters of exudative appearing fluid obtained. Samples sent for infection, inflammation and malignancy evaluation.   Consults:  Pulm   Procedures:  Right thoracentesis 2 liters cloudy orange appearing fluid removed. 12/4  Significant Diagnostic Tests:  CT chest and abd 12/4: neg for PE, mod to large right effusion, chronic stable subpleural reticulations, rounded opacity right heart border. CT abd: w/ sub centimeter left adrenal nodule    CT chest post thora 12/4 -small apical right pneumothorax, left lower lobe airspace disease/consolidation, moderate left effusion  Micro Data:  BCX 12/4> ng resp culture 12/4: >>> Pleural fluid 12/4>>>ng  Antimicrobials:  Cefepime 12/4>>> Metronidazole 12/4>>> 12/4 vanc 12/4>>> 12/4  Interim history/subjective:   Complaints of  dyspnea, afebrile No chest pain   Objective   Blood pressure 126/60, pulse 94, temperature 98.3 F (36.8 C), temperature source Oral, resp. rate (!) 27, height 5\' 6"  (1.676 m), weight 71.3 kg, SpO2 96 %.        Intake/Output Summary (Last 24 hours) at 11/01/2018 0953 Last data filed at 11/01/2018 0900 Gross per 24 hour  Intake 854.96 ml  Output 1625 ml  Net -770.04 ml   Filed Weights   10/30/18 1533 10/31/18 0120 11/01/18 0240  Weight: 70.3 kg 74 kg 71.3 kg    Examination: Elderly woman, mild respiratory distress on nasal cannula Decreased breath sounds on right, clear on the left, No JVD or edema No pallor or icterus Soft nontender abdomen S1-S2 regular Alert and interactive, nonfocal  Resolved Hospital Problem list     Assessment & Plan:   Acute Hypoxic respiratory failure in setting of recurrent large right effusion AND element of pulmonary edema.  Left lower lobe HCAP No evidence of recurrent malignancy Much improved post Thora 12/4  Plan Cont cefepime , procalcitonin to guide duration DNR/DNI BIPAP PRN   Afib, h/o CAD and acute on chronic diastolic HF Plan Lasix  Can resume apixaban  H/o lung cancer s/p RUL lobectomy, chemo and radiation. On observation therapy since June this year [facility] has been notified of her admit   Hypokalemia and hypomagnesemia will be repleted aggressively while on diuretics Stepdown status  Kara Mead MD. FCCP. [facility] Pager [phone] If no response call [phone]   11/01/2018

## 2018-11-01 NOTE — Progress Notes (Signed)
Patient ID: Pamela Mcguire, female   DOB: Jun 14, 1935, 83 y.o.   MRN: [id number]  PROGRESS NOTE    Pamela Mcguire  ZWC:[id number] DOB: [birth year] DOA: 10/30/2018 PCP: Hoyt Koch, MD   Brief Narrative:  83 year old female with history of diastolic CHF, atrial fibrillation, hyperlipidemia, sleep apnea, COPD presented with worsening shortness of breath and chest pain.  She was found to be hypotensive, tachycardic and febrile in the ED.  CT scan of the chest and abdomen showed large right-sided pleural effusion.  She was started on broad-spectrum antibiotics and IV fluids.  Subsequently she became more hypoxic requiring BiPAP.  PCCM was consulted.   Assessment & Plan:   Principal Problem:   SIRS (systemic inflammatory response syndrome) (HCC) Active Problems:   Paroxysmal a-fib (HCC)   Essential hypertension   Severe obstructive sleep apnea   Adenocarcinoma of upper lobe of right lung   Breast cancer (HCC)   CAD (coronary artery disease)   COPD exacerbation (HCC)   Chronic respiratory failure with hypoxia ([place]), 2L home O2   Recurrent pleural effusion on right  Sepsis/SIRS most likely secondary to pneumonia: Present on admission -Continue cefepime.  Follow cultures.  IV fluids was discontinued because of worsening shortness of breath.  Acute hypoxic respite failure secondary to pneumonia/bilateral pleural effusion/pulmonary edema -Patient required BiPAP for little while on 10/31/2018.  Currently on 7 L oxygen via nasal cannula.  Continue oxygen supplementation and wean off as able.  PCCM following -Overall prognosis is guarded.  Palliative care consult is pending.  Bilateral pleural effusion status post right-sided thoracentesis and subsequent right-sided small pneumothorax -Patient had right-sided thoracentesis and 2 L fluid removed on 10/31/2018 and subsequently CT scan of the chest showed small right-sided pneumothorax and moderate left-sided pleural effusion along with consolidation  on the left side -PCCM following -Currently getting intravenous Lasix.  Will increase to 80 mg IV every 12 hours  Community-acquired bacterial multilobar pneumonia -Currently on broad-spectrum antibiotics cefepime.  Vancomycin has been discontinued.  Follow cultures  Acute on chronic diastolic CHF -EF in September 2019 was 60 to 65%.  Initially Lasix was held because of low blood pressure but Lasix has been restarted.  Will increase Lasix to 80 mg IV every 12 hours.  Strict input and output.  Daily weights.  Outpatient follow-up with cardiology  Chest pain with only minimally elevated troponin -Most likely secondary to above.  Management as above  COPD with mild exacerbation -Patient received 1 dose of Solu-Medrol on 10/31/2018.  Not currently wheezing.  Will hold off on further steroids.  Continue nebs and Pulmicort next  Paroxysmal A. fib -Currently on heparin.  Eliquis on hold.  We will put her back on Eliquis.  Continue digoxin and Cardizem  Leukocytosis -Improving  Hypokalemia -Replace.  Repeat labs in a.m. labs  Hypomagnesemia -Replace.  Repeat a.m. labs  History of lung cancer, breast cancer and colon cancer in remission   DVT prophylaxis: Heparin Code Status: DNR Family Communication: None at bedside Disposition Plan: Depends on clinical outcome  Consultants: PCCM/palliative care  Procedures: Right-sided thoracentesis on 10/31/2018 by PCCM  Antimicrobials: Cefepime from 10/30/2018 onwards Vancomycin 10/30/2018-10/31/2018   Subjective: Patient seen and examined at bedside.  She feels lousy.  Feels weak and tired.  Still short of breath.  No overnight fever or vomiting.  Objective: Vitals:   11/01/18 0300 11/01/18 0400 11/01/18 0500 11/01/18 0600  BP: (!) 123/43 (!) 144/53 (!) 116/40 126/60  Pulse: 91 90 98 94  Resp: (!) 25 (!)  45 (!) 26 (!) 27  Temp:  98.4 F (36.9 C)    TempSrc:  Oral    SpO2: 90% 96% 96% 96%  Weight:      Height:        Intake/Output  Summary (Last 24 hours) at 11/01/2018 0811 Last data filed at 11/01/2018 0626 Gross per 24 hour  Intake 854.96 ml  Output 950 ml  Net -95.04 ml   Filed Weights   10/30/18 1533 10/31/18 0120 11/01/18 0240  Weight: 70.3 kg 74 kg 71.3 kg    Examination:  General exam: Appears ill looking but no distress.  Intermittent tachypneic Respiratory system: Bilateral decreased breath sounds at bases with scattered crackles, no wheezing.  Intermittent tachypnea.  No accessory muscle use Cardiovascular system: S1 & S2 heard, Rate controlled Gastrointestinal system: Abdomen is nondistended, soft and nontender. Normal bowel sounds heard. Extremities: No cyanosis, clubbing, edema  Central nervous system: Alert and oriented. No focal neurological deficits. Moving extremities Skin: No rashes, lesions or ulcers Psychiatry: Looks anxious  Data Reviewed: I have personally reviewed following labs and imaging studies  CBC: Recent Labs  Lab 10/30/18 1556 10/30/18 1603 10/31/18 0304 11/01/18 0500  WBC 16.0*  --  14.2* 11.0*  NEUTROABS 14.6*  --  12.2*  --   HGB 11.9* 12.6 11.3* 10.1*  HCT 36.3 37.0 35.8* 31.7*  MCV 93.6  --  96.5 96.1  PLT 298  --  298 094   Basic Metabolic Panel: Recent Labs  Lab 10/30/18 1556 10/30/18 1603 10/31/18 0304 10/31/18 1554  NA 135 134* 139 136  K 3.0* 3.0* 2.9* 3.0*  CL 92* 89* 103 96*  CO2 32  --  28 29  GLUCOSE 162* 161* 147* 117*  BUN 13 12 11 11   CREATININE 1.04* 0.90 0.77 0.92  CALCIUM 8.0*  --  6.9* 6.7*  MG  --   --   --  1.1*   GFR: Estimated Creatinine Clearance: 48.5 mL/min (by C-G formula based on SCr of 0.92 mg/dL). Liver Function Tests: Recent Labs  Lab 10/30/18 1556 10/31/18 0304 10/31/18 1215  AST 23 17  --   ALT 15 14  --   ALKPHOS 54 56  --   BILITOT 1.3* 1.1  --   PROT 6.2* 5.7* 6.2*  ALBUMIN 3.1* 2.8*  --    Recent Labs  Lab 10/30/18 1556  LIPASE 28   No results for input(s): AMMONIA in the last 168 hours. Coagulation  Profile: Recent Labs  Lab 10/31/18 0048  INR 1.67   Cardiac Enzymes: Recent Labs  Lab 10/31/18 0048 10/31/18 0606 10/31/18 1215  TROPONINI 0.04* 0.04* 0.05*   BNP (last 3 results) Recent Labs    09/12/18 1130  PROBNP 175.0*   HbA1C: No results for input(s): HGBA1C in the last 72 hours. CBG: No results for input(s): GLUCAP in the last 168 hours. Lipid Profile: Recent Labs    10/31/18 1215  CHOL 153   Thyroid Function Tests: No results for input(s): TSH, T4TOTAL, FREET4, T3FREE, THYROIDAB in the last 72 hours. Anemia Panel: No results for input(s): VITAMINB12, FOLATE, FERRITIN, TIBC, IRON, RETICCTPCT in the last 72 hours. Sepsis Labs: Recent Labs  Lab 10/30/18 1603 10/30/18 1814 10/31/18 0048 10/31/18 0057 10/31/18 0304  PROCALCITON  --   --  0.10  --   --   LATICACIDVEN 4.05* 2.10*  --  1.6 1.4    Recent Results (from the past 240 hour(s))  Blood Culture (routine x 2)  Status: None (Preliminary result)   Collection Time: 10/30/18  4:08 PM  Result Value Ref Range Status   Specimen Description   Final    BLOOD RIGHT ARM Performed at [facility] [address] [place], [place] [address]    Special Requests   Final    BOTTLES DRAWN AEROBIC AND ANAEROBIC Blood Culture adequate volume Performed at [facility] [address], [place], [place] [address]    Culture   Final    NO GROWTH < 12 HOURS Performed at [facility] [address], [place], [place] [address]    Report Status PENDING  Incomplete  Urine culture     Status: Abnormal   Collection Time: 10/30/18  5:26 PM  Result Value Ref Range Status   Specimen Description   Final    URINE, RANDOM Performed at [facility] [address] [place] [address], [place], [place] [address]    Special Requests   Final    NONE Performed at [facility], [place] [address], [place], [place] [address]    Culture (A)  Final    <10,000  COLONIES/mL INSIGNIFICANT GROWTH Performed at [facility] [address], [place], [place] [address]    Report Status 11/01/2018 FINAL  Final  MRSA PCR Screening     Status: None   Collection Time: 10/30/18 11:55 PM  Result Value Ref Range Status   MRSA by PCR NEGATIVE NEGATIVE Final    Comment:        The GeneXpert MRSA Assay (FDA approved for NASAL specimens only), is one component of a comprehensive MRSA colonization surveillance program. It is not intended to diagnose MRSA infection nor to guide or monitor treatment for MRSA infections. Performed at [facility], [place] [address] [place], [place] [address]   Expectorated sputum assessment w rflx to resp cult     Status: None   Collection Time: 10/31/18  1:26 AM  Result Value Ref Range Status   Specimen Description SPUTUM  Final   Special Requests NONE  Final   Sputum evaluation   Final    THIS SPECIMEN IS ACCEPTABLE FOR SPUTUM CULTURE Performed at [facility], [place] [address] [place], [place] [address]    Report Status 10/31/2018 FINAL  Final  Culture, respiratory     Status: None (Preliminary result)   Collection Time: 10/31/18  1:26 AM  Result Value Ref Range Status   Specimen Description   Final    SPUTUM Performed at [facility] [address], [place], [place] [address]    Special Requests   Final    NONE Reflexed from [id number] Performed at [facility], [place] [address], [place], [place] [address]    Gram Stain   Final    ABUNDANT WBC PRESENT, PREDOMINANTLY PMN NO ORGANISMS SEEN Performed at [facility], [place] [address], [place], [place] [address]    Culture PENDING  Incomplete   Report Status PENDING  Incomplete  Blood Culture (routine x 2)     Status: None (Preliminary result)   Collection Time: 10/31/18  3:04 AM  Result Value Ref Range Status   Specimen Description   Final    BLOOD RIGHT HAND Performed at [facility], [place] [address], [place], [place] [address]    Special Requests   Final    AEROBIC BOTTLE ONLY Blood Culture adequate volume Performed at [facility],  [place] [address], [place], [place] [address]    Culture PENDING  Incomplete   Report Status PENDING  Incomplete  Body fluid culture     Status: None (Preliminary result)   Collection Time: 10/31/18 10:20 AM  Result Value Ref Range Status   Specimen Description   Final    PLEURAL RIGHT Performed at [facility] [address] [place], [place] [address]    Special Requests   Final    NONE Performed at [facility] [place] [address] [place], [place] [address]    Gram Stain   Final    WBC PRESENT, PREDOMINANTLY PMN NO ORGANISMS SEEN CYTOSPIN SMEAR    Culture   Final    NO GROWTH < 24 HOURS Performed at [facility], [place] [address], [place], [place] [address]    Report Status PENDING  Incomplete         Radiology Studies: Dg Chest 1 View  Result Date: 10/31/2018 CLINICAL DATA:  Short of breath EXAM: CHEST  1 VIEW COMPARISON:  10/30/2018 FINDINGS: Large right pleural effusion with a essentially complete collapse of the right lung. Significant progression of right pleural effusion since yesterday. Increased reticular markings throughout the left lung suggestive of pulmonary edema. Small left effusion. Atherosclerotic aortic arch. IMPRESSION: Progression of large right pleural effusion with near complete collapse of the right lung Progression of reticular lung markings in the left lung suggestive of pulmonary edema. Electronically Signed   By: Franchot Gallo M.D.   On: 10/31/2018 08:29   Dg Chest 2 View  Result Date: 10/30/2018 CLINICAL DATA:  Cough and shortness of breath EXAM: CHEST - 2 VIEW COMPARISON:  08/29/2018 FINDINGS: Cardiac shadow is enlarged but stable. Aortic calcifications are again seen. Elevation of the right hemidiaphragm is again noted  consistent with prior volume loss. Increased density is noted in the right paratracheal region stable from the previous exam. Patchy atelectatic changes are noted in the bases bilaterally. No sizable effusion is noted. No bony abnormality is seen. IMPRESSION: Patchy bibasilar atelectasis superimposed over more chronic changes with elevation of the right hemidiaphragm. Electronically Signed   By: Inez Catalina M.D.   On: 10/30/2018 16:23   Ct Chest Wo Contrast  Result Date: 10/31/2018 CLINICAL DATA:  Non-small cell lung cancer with locoregional recurrence. Assess response to therapy. EXAM: CT CHEST WITHOUT CONTRAST TECHNIQUE: Multidetector CT imaging of the chest was performed following the standard protocol without IV contrast. COMPARISON:  10/30/2018 FINDINGS: Cardiovascular: Mild cardiac enlargement. Extensive aortic atherosclerosis. Three vessel coronary artery atherosclerotic calcifications noted. Mediastinum/Nodes: Small low-attenuation thyroid nodules are again noted. The trachea appears patent and is midline. Normal appearance of the esophagus. No axillary or supraclavicular adenopathy. High right paratracheal lymph node measures 0.9 cm, image 45/3. Unchanged from previous exam. Left pre-vascular lymph node measures 1.1 cm, image 52/3. Unchanged from previous exam. Subcarinal lymph node is enlarged measuring 1.6 cm, image 70/3. Unchanged. The hilar regions are suboptimally evaluated due to lack of IV contrast material. Lungs/Pleura: Moderate to large right pleural effusion is again noted. This is decreased in size when compared with the previous exam status post thoracentesis. Tiny right anterior and medial pneumothorax is identified overlying the right lower lobe and right middle lobe. There is a new small to moderate left pleural effusion identified. Progressive multifocal bilateral areas of airspace consolidation with surrounding ground-glass attenuation are identified bilaterally. This includes a new  area within the posterior and lateral left upper lobe, image, image  44/8. Also new, is dense airspace consolidation and atelectasis within the left lower lobe overlying the new left pleural effusion, image 113/7. Masslike architectural distortion and fibrosis within the paramediastinal right lung is identified compatible with treatment related changes secondary to external beam radiation, image 57/8. Within the anterior right lower lobe there is a focal area of persistent and mildly progressive nodularity measuring 1.8 by 1.1 cm, image 33/6. On 01/02/2018 this measured 1.4 by 1.0 cm. Nonspecific nodularity within the right middle lobe is identified on image 93/8. This may be postinflammatory or infectious in etiology. Upper Abdomen: No acute abnormality. Musculoskeletal: No suspicious bone lesions identified.Compression fracture involving the L1 vertebra is unchanged. IMPRESSION: 1. Interval decrease in volume of right pleural effusion status post thoracentesis. There is a tiny right right-sided pneumothorax. 2. Interval development of moderate left pleural effusion. 3. Interval development of dense left lower lobe airspace consolidation and patchy areas of consolidation within the left upper lobe with surrounding areas of ground-glass attenuation. These are new when compared with 10/30/2018 and are favored to represent progressive changes of inflammation and/or infection. A nodular density within the anterior right lower lobe is a noted and demonstrates mild increase in size compared with 01/02/2018. This is nonspecific. Residual/recurrence of tumor is not excluded. Masslike architectural distortion and fibrosis within the right upper lung is again noted and is presumed to represent post treatment changes. 4. No significant change in prominent right paratracheal and prevascular lymph nodes and enlarged subcarinal lymph node. In the setting of infection and/or CHF this is a nonspecific finding. Metastatic adenopathy  cannot be excluded. Electronically Signed   By: Kerby Moors M.D.   On: 10/31/2018 16:20   Ct Angio Chest Pe W And/or Wo Contrast  Result Date: 10/30/2018 CLINICAL DATA:  Chest pain and dyspnea since yesterday productive cough. History of breast cancer, stage III non-small cell cancer, mastectomy and cholecystectomy EXAM: CT ANGIOGRAPHY CHEST CT ABDOMEN AND PELVIS WITH CONTRAST TECHNIQUE: Multidetector CT imaging of the chest was performed using the standard protocol during bolus administration of intravenous contrast. Multiplanar CT image reconstructions and MIPs were obtained to evaluate the vascular anatomy. Multidetector CT imaging of the abdomen and pelvis was performed using the standard protocol during bolus administration of intravenous contrast. CONTRAST:  127mL ISOVUE-370 IOPAMIDOL (ISOVUE-370) INJECTION 76% COMPARISON:  07/04/2017 CT chest, abdomen and pelvis. FINDINGS: CTA CHEST FINDINGS Cardiovascular: Satisfactory opacification of the pulmonary arteries to the segmental level. No evidence of pulmonary embolism. Conventional branch pattern of the great vessels with atherosclerosis of the great vessels. Moderate atheromatous calcification of the thoracic aorta without dissection or aneurysm. Left main and three-vessel coronary arteriosclerosis is identified. No pericardial effusion thickening. Mediastinum/Nodes: The previously noted subcarinal adenopathy is obscured by presence of a large right pleural effusion. Mildly enlarged prevascular lymph nodes are identified since prior measuring up to 11 mm short axis. 10 mm right upper paratracheal lymph node is also noted. No definite hilar adenopathy. The right hilum however is obscured by atelectasis and pleural fluid. No thyromegaly. Cystic nodules are noted in the lower poles of the thyroid gland measuring up to 1 cm each. The trachea is midline and patent. The mainstem bronchi appear patent. The esophagus is unremarkable. Lungs/Pleura: Large right  effusion with compressive atelectasis, new since prior. These findings obscure the pulmonary nodularity noted at the right lung base on prior exam. New ill-defined opacity adjacent to the right heart border is nonspecific but may also represent focal atelectasis and/or scarring. Nodule is not entirely  excluded, series 93, measuring 16 mm. Short-term interval follow-up is recommended. Redemonstration of subpleural reticular opacities bilaterally with superimposed mild diffuse interstitial edema noted. Pulmonary nodule at the right lung base is respiratory motion artifacts limit assessment. No pneumothorax. No significant pleural effusion on the left. Musculoskeletal: Mild multilevel thoracic spondylosis disc space narrowing and endplate spurring since our prior. No aggressive osseous lesions. Right mastectomy with associated right axillary lymph node dissection. Review of the MIP images confirms the above findings. CT ABDOMEN and PELVIS FINDINGS Hepatobiliary: No focal liver abnormality is seen. Status post cholecystectomy. Slight reservoir effect status post cholecystectomy believed to account for the slight intrahepatic ductal dilatation of the left lobe. Findings are similar to prior however. Pancreas: Unremarkable. No pancreatic ductal dilatation or surrounding inflammatory changes. Spleen: Small splenule along the caudal aspect. The spleen abuts the slightly enlarged and elongated left hepatic lobe the subtle hypodensity in the spleen is less apparent and may have represented a small hemangioma or partial volume averaging. Adrenals/Urinary Tract: Stable hypodense left adrenal nodule measuring 7 mm. The right adrenal gland is normal. Stable bilateral renal cysts, the largest on right measuring 3.7 cm versus 3.5 cm previously. Smaller cysts are noted bilaterally too small further characterize. No nephrolithiasis or hydronephrosis. The urinary bladder is unremarkable for the degree of distention. Stomach/Bowel:  Decompressed stomach. Stable duodenal diverticulum off the distal second portion. Normal small bowel rotation and ligament Treitz position partial right colectomy. Colonic diverticulosis along the descending and sigmoid colon without acute diverticulitis. Probable large diverticulum off the descending colon given similar appearance, series 11/63. Vascular/Lymphatic: Moderate aortoiliac atherosclerosis without aneurysm. No lymphadenopathy. Reproductive: Uterus and bilateral adnexa are unremarkable. Mild engorgement of left periuterine and gonadal veins. Other: Small bilateral fat containing inguinal hernias. No abdominopelvic ascites. Musculoskeletal: Chronic stable moderate compression deformity of L1 with 50% loss. No aggressive or suspicious osseous lesions. Review of the MIP images confirms the above findings. IMPRESSION: Chest CT: 1. Moderate to large right pleural effusion with adjacent compressive atelectasis. 2. Chronic stable subpleural reticulations within both lungs. 16 mm rounded opacity adjacent to the right heart border may represent a focus of rounded atelectasis. As a pulmonary nodule is not entirely excluded, consider short-term interval follow-up or PET-CT. 3. No acute pulmonary embolus. 4. Coronary arteriosclerosis and aortic atherosclerosis. CT AP: 1. Stable subcentimeter left adrenal nodule measuring 7 mm. 2. Descending and sigmoid diverticulosis without acute diverticulitis. 3. Status post partial right colectomy without complicating features. No bowel obstruction or inflammation. 4. Thoracolumbar spondylosis with chronic moderate compression deformity of L1. Electronically Signed   By: Ashley Royalty M.D.   On: 10/30/2018 19:42   Ct Abdomen Pelvis W Contrast  Result Date: 10/30/2018 CLINICAL DATA:  Chest pain and dyspnea since yesterday productive cough. History of breast cancer, stage III non-small cell cancer, mastectomy and cholecystectomy EXAM: CT ANGIOGRAPHY CHEST CT ABDOMEN AND PELVIS  WITH CONTRAST TECHNIQUE: Multidetector CT imaging of the chest was performed using the standard protocol during bolus administration of intravenous contrast. Multiplanar CT image reconstructions and MIPs were obtained to evaluate the vascular anatomy. Multidetector CT imaging of the abdomen and pelvis was performed using the standard protocol during bolus administration of intravenous contrast. CONTRAST:  158mL ISOVUE-370 IOPAMIDOL (ISOVUE-370) INJECTION 76% COMPARISON:  07/04/2017 CT chest, abdomen and pelvis. FINDINGS: CTA CHEST FINDINGS Cardiovascular: Satisfactory opacification of the pulmonary arteries to the segmental level. No evidence of pulmonary embolism. Conventional branch pattern of the great vessels with atherosclerosis of the great vessels. Moderate atheromatous calcification of  the thoracic aorta without dissection or aneurysm. Left main and three-vessel coronary arteriosclerosis is identified. No pericardial effusion thickening. Mediastinum/Nodes: The previously noted subcarinal adenopathy is obscured by presence of a large right pleural effusion. Mildly enlarged prevascular lymph nodes are identified since prior measuring up to 11 mm short axis. 10 mm right upper paratracheal lymph node is also noted. No definite hilar adenopathy. The right hilum however is obscured by atelectasis and pleural fluid. No thyromegaly. Cystic nodules are noted in the lower poles of the thyroid gland measuring up to 1 cm each. The trachea is midline and patent. The mainstem bronchi appear patent. The esophagus is unremarkable. Lungs/Pleura: Large right effusion with compressive atelectasis, new since prior. These findings obscure the pulmonary nodularity noted at the right lung base on prior exam. New ill-defined opacity adjacent to the right heart border is nonspecific but may also represent focal atelectasis and/or scarring. Nodule is not entirely excluded, series 93, measuring 16 mm. Short-term interval follow-up is  recommended. Redemonstration of subpleural reticular opacities bilaterally with superimposed mild diffuse interstitial edema noted. Pulmonary nodule at the right lung base is respiratory motion artifacts limit assessment. No pneumothorax. No significant pleural effusion on the left. Musculoskeletal: Mild multilevel thoracic spondylosis disc space narrowing and endplate spurring since our prior. No aggressive osseous lesions. Right mastectomy with associated right axillary lymph node dissection. Review of the MIP images confirms the above findings. CT ABDOMEN and PELVIS FINDINGS Hepatobiliary: No focal liver abnormality is seen. Status post cholecystectomy. Slight reservoir effect status post cholecystectomy believed to account for the slight intrahepatic ductal dilatation of the left lobe. Findings are similar to prior however. Pancreas: Unremarkable. No pancreatic ductal dilatation or surrounding inflammatory changes. Spleen: Small splenule along the caudal aspect. The spleen abuts the slightly enlarged and elongated left hepatic lobe the subtle hypodensity in the spleen is less apparent and may have represented a small hemangioma or partial volume averaging. Adrenals/Urinary Tract: Stable hypodense left adrenal nodule measuring 7 mm. The right adrenal gland is normal. Stable bilateral renal cysts, the largest on right measuring 3.7 cm versus 3.5 cm previously. Smaller cysts are noted bilaterally too small further characterize. No nephrolithiasis or hydronephrosis. The urinary bladder is unremarkable for the degree of distention. Stomach/Bowel: Decompressed stomach. Stable duodenal diverticulum off the distal second portion. Normal small bowel rotation and ligament Treitz position partial right colectomy. Colonic diverticulosis along the descending and sigmoid colon without acute diverticulitis. Probable large diverticulum off the descending colon given similar appearance, series 11/63. Vascular/Lymphatic: Moderate  aortoiliac atherosclerosis without aneurysm. No lymphadenopathy. Reproductive: Uterus and bilateral adnexa are unremarkable. Mild engorgement of left periuterine and gonadal veins. Other: Small bilateral fat containing inguinal hernias. No abdominopelvic ascites. Musculoskeletal: Chronic stable moderate compression deformity of L1 with 50% loss. No aggressive or suspicious osseous lesions. Review of the MIP images confirms the above findings. IMPRESSION: Chest CT: 1. Moderate to large right pleural effusion with adjacent compressive atelectasis. 2. Chronic stable subpleural reticulations within both lungs. 16 mm rounded opacity adjacent to the right heart border may represent a focus of rounded atelectasis. As a pulmonary nodule is not entirely excluded, consider short-term interval follow-up or PET-CT. 3. No acute pulmonary embolus. 4. Coronary arteriosclerosis and aortic atherosclerosis. CT AP: 1. Stable subcentimeter left adrenal nodule measuring 7 mm. 2. Descending and sigmoid diverticulosis without acute diverticulitis. 3. Status post partial right colectomy without complicating features. No bowel obstruction or inflammation. 4. Thoracolumbar spondylosis with chronic moderate compression deformity of L1. Electronically Signed  By: Ashley Royalty M.D.   On: 10/30/2018 19:42   Dg Chest Port 1 View  Addendum Date: 10/31/2018   ADDENDUM REPORT: 10/31/2018 12:16 ADDENDUM: Trace right pneumothorax discussed by telephone with Dr. Salvadore Dom on 10/31/2018 at 1149 hours. Electronically Signed   By: Genevie Ann M.D.   On: 10/31/2018 12:16   Result Date: 10/31/2018 CLINICAL DATA:  83 year old female status post thoracentesis. EXAM: PORTABLE CHEST 1 VIEW COMPARISON:  0740 hours today and earlier. FINDINGS: Portable AP semi upright view at 1043 hours. Substantially reduced right pleural effusion and improved right lung ventilation. There is a trace right apical pneumothorax visible. There is right upper lobe and  persistent left lung reticulonodular opacity which is nonspecific. Dense retrocardiac opacity with obscuration of the left hemidiaphragm has increased since 10/30/2018. Calcified aortic atherosclerosis. IMPRESSION: 1. Substantially reduced right pleural effusion and improved right lung ventilation following thoracentesis. There is a trace right apical pneumothorax. 2. Nonspecific bilateral reticulonodular opacity and increased left lower lobe collapse or consolidation since 10/30/2018. Electronically Signed: By: Genevie Ann M.D. On: 10/31/2018 11:44        Scheduled Meds: . atorvastatin  40 mg Oral q1800  . budesonide (PULMICORT) nebulizer solution  0.25 mg Nebulization BID  . digoxin  0.125 mg Oral Daily  . diltiazem  240 mg Oral Daily  . ferrous sulfate  325 mg Oral QPM  . fesoterodine  4 mg Oral Daily  . furosemide  40 mg Intravenous BID  . ipratropium-albuterol  3 mL Nebulization Q6H  . mirtazapine  15 mg Oral QHS  . multivitamin with minerals  1 tablet Oral Daily  . pantoprazole  40 mg Oral Daily  . potassium chloride  40 mEq Oral Q4H  . sertraline  50 mg Oral Daily   Continuous Infusions: . ceFEPime (MAXIPIME) IV Stopped (11/01/18 0617)  . heparin 1,000 Units/hr (11/01/18 0110)  . magnesium sulfate 1 - 4 g bolus IVPB       LOS: 2 days        Aline August, MD [facility] Pager [phone]  If 7PM-7AM, please contact night-coverage [email or web address] Password [email or web address] 11/01/2018, 8:11 AM

## 2018-11-02 ENCOUNTER — Ambulatory Visit (HOSPITAL_COMMUNITY): Payer: Medicare HMO

## 2018-11-02 ENCOUNTER — Other Ambulatory Visit: Payer: Medicare HMO

## 2018-11-02 ENCOUNTER — Inpatient Hospital Stay (HOSPITAL_COMMUNITY): Payer: Medicare HMO

## 2018-11-02 DIAGNOSIS — A419 Sepsis, unspecified organism: Principal | ICD-10-CM

## 2018-11-02 DIAGNOSIS — J9601 Acute respiratory failure with hypoxia: Secondary | ICD-10-CM

## 2018-11-02 DIAGNOSIS — J441 Chronic obstructive pulmonary disease with (acute) exacerbation: Secondary | ICD-10-CM

## 2018-11-02 LAB — LEGIONELLA PNEUMOPHILA SEROGP 1 UR AG: L. pneumophila Serogp 1 Ur Ag: NEGATIVE

## 2018-11-02 LAB — BASIC METABOLIC PANEL
Anion gap: 13 (ref 5–15)
BUN: 10 mg/dL (ref 8–23)
CO2: 27 mmol/L (ref 22–32)
Calcium: 7.6 mg/dL — ABNORMAL LOW (ref 8.9–10.3)
Chloride: 96 mmol/L — ABNORMAL LOW (ref 98–111)
Creatinine, Ser: 0.85 mg/dL (ref 0.44–1.00)
GFR calc Af Amer: >60 mL/min (ref >60)
Glucose, Bld: 113 mg/dL — ABNORMAL HIGH (ref 70–99)
Potassium: 4.5 mmol/L (ref 3.5–5.1)
Sodium: 136 mmol/L (ref 135–145)

## 2018-11-02 LAB — CULTURE, RESPIRATORY W GRAM STAIN

## 2018-11-02 LAB — CBC
HCT: 38.5 % (ref 36.0–46.0)
Hemoglobin: 11.6 g/dL — ABNORMAL LOW (ref 12.0–15.0)
MCH: 28.9 pg (ref 26.0–34.0)
MCHC: 30.1 g/dL (ref 30.0–36.0)
MCV: 96 fL (ref 80.0–100.0)
Platelets: 352 10*3/uL (ref 150–400)
RBC: 4.01 MIL/uL (ref 3.87–5.11)
RDW: 14.1 % (ref 11.5–15.5)
WBC: 10.9 10*3/uL — ABNORMAL HIGH (ref 4.0–10.5)
nRBC: 0 % (ref 0.0–0.2)

## 2018-11-02 LAB — MAGNESIUM: Magnesium: 2.3 mg/dL (ref 1.7–2.4)

## 2018-11-02 LAB — CHOLESTEROL, BODY FLUID: CHOL FL: 43 mg/dL

## 2018-11-02 LAB — RHEUMATOID FACTORS, FLUID: Rheumatoid Arthritis, Qn/Fluid: 1:20 {titer} — ABNORMAL HIGH

## 2018-11-02 LAB — PH, BODY FLUID: pH, Body Fluid: 7.9

## 2018-11-02 LAB — PROCALCITONIN: Procalcitonin: 0.15 ng/mL

## 2018-11-02 MED ORDER — FUROSEMIDE 10 MG/ML IJ SOLN
80.0000 mg | Freq: Three times a day (TID) | INTRAMUSCULAR | Status: DC
  Administered 2018-11-02 – 2018-11-04 (×6): 80 mg via INTRAVENOUS
  Filled 2018-11-02 (×6): qty 8

## 2018-11-02 MED ORDER — METHYLPREDNISOLONE SODIUM SUCC 125 MG IJ SOLR: 60.0000 mg | Freq: Three times a day (TID) | INTRAMUSCULAR | Status: DC

## 2018-11-02 MED ORDER — METHYLPREDNISOLONE SODIUM SUCC 40 MG IJ SOLR
40.0000 mg | Freq: Two times a day (BID) | INTRAMUSCULAR | Status: DC
  Administered 2018-11-02 – 2018-11-04 (×5): 40 mg via INTRAVENOUS
  Filled 2018-11-02 (×5): qty 1

## 2018-11-02 MED ORDER — ORAL CARE MOUTH RINSE
15.0000 mL | Freq: Two times a day (BID) | OROMUCOSAL | Status: DC
  Administered 2018-11-02 – 2018-11-12 (×14): 15 mL via OROMUCOSAL

## 2018-11-02 NOTE — Progress Notes (Signed)
NAME:  Pamela Mcguire, MRN:  [id number], DOB:  1935-11-25, LOS: 3 ADMISSION DATE:  10/30/2018, CONSULTATION DATE:  12/4 REFERRING MD:  [name], CHIEF COMPLAINT:  Acute hypoxic respiratory failure    Brief History   83 year old woman with stage IIIa adenocarcinoma status post right upper lobectomy and chemoradiation who had a recurrent right pleural effusion that was tapped twice in 05/2018 and again in 07/2018 with negative cytology.  She is also a colon cancer survivor and has a history of chronic atrial fibrillation and diastolic heart failure. She was admitted 12/3 with shortness of breath, fever and hypoxia, CT chest showing large right effusion,  Significant Hospital Events   12/4 admitted on 12/4 w/ working dx of large right effusion, possible post-obstruction atelectasis vs PNA. Was getting IVFs and abx. PCCM consulted for acute worsening of respiratory failure. Bed side thoracentesis completed. 2 liters of exudative appearing fluid obtained. Samples sent for infection, inflammation and malignancy evaluation.  12/6 effusion worse again. Remains volume overloaded. Adjusting diuretics. Still awaiting pleural cholesterol. Treating as a mix of PNA and volume overload/edema   Consults:  Pulm   Procedures:  Right thoracentesis 2 liters cloudy orange appearing fluid removed. 12/4  Significant Diagnostic Tests:  CT chest and abd 12/4: neg for PE, mod to large right effusion, chronic stable subpleural reticulations, rounded opacity right heart border. CT abd: w/ sub centimeter left adrenal nodule   CT chest post thora 12/4 -small apical right pneumothorax, left lower lobe airspace disease/consolidation, moderate left effusion  Micro Data:  BCX 12/4> ng resp culture 12/4: >>> Pleural fluid 12/4>>>ng Strep antigen 12/5: neg Legionella antigen 12/5>>>  Antimicrobials:  Cefepime 12/4>>> Metronidazole 12/4>>> 12/4 vanc 12/4>>> 12/4  Interim history/subjective:  Feels better.  Objective     Blood pressure (Abnormal) 154/57, pulse 76, temperature 98.3 F (36.8 C), temperature source Oral, resp. rate (Abnormal) 32, height 5\' 6"  (1.676 m), weight 70 kg, SpO2 94 %.        Intake/Output Summary (Last 24 hours) at 11/02/2018 0933 Last data filed at 11/02/2018 0800 Gross per 24 hour  Intake 1397.02 ml  Output 2450 ml  Net -1052.98 ml   Filed Weights   10/31/18 0120 11/01/18 0240 11/02/18 0500  Weight: 74 kg 71.3 kg 70 kg   Examination: gen up in chair. No distress.  HENT NCAT no JVD pulm decreased bases. Scattered rales, some upper airway wheezing Card regular irreg. afib on tele  abd not tender + bowel sounds Ext LE edema improving brisk CR Neuro intact   Resolved Hospital Problem list     Assessment & Plan:   Acute Hypoxic respiratory failure in setting of recurrent large right effusion AND element of pulmonary edema.  Left lower lobe HCAP -effusion exudative and recurrent etiology not clear. Wonder if more of pseudo-exudate d/t lasix or perhaps lung entrapment (d/t the chronicity and as a result of prior XRT? )  -culture data to date neg, WBC trending down -pcxr PTX resolved. Aeration worse again w/ increasing recurrent effusion & persistent edema Plan Cont aggressive diuretics (agree w/ 80 tid) May need to add spironolactone Day 4/7 cefepime  Wean oxygen F/u pleural  cholesterol  DNR/DNI Solumedrol added back per primary service, suspect that the wheezing reflects "cardiac asthma" and worsening overload not bronchospasm. Will change to 40 q 12 & would taper quickly   Small right pneumothorax -favor ex vacuo->now resolved Plan Trend PCXR   Afib, h/o CAD and acute on chronic diastolic HF Plan Lasix  apixiban  H/o lung cancer s/p RUL lobectomy, chemo and radiation. On observation therapy since June this year -cytology neg on Pleural fluid Plan F/u onc   Erick Colace ACNP-BC [place] Pager # [phone] OR # [phone] if no  answer   11/02/2018

## 2018-11-02 NOTE — Consult Note (Addendum)
Consultation Note Date: 11/02/2018   Patient Name: Pamela Mcguire  DOB: 09-Dec-1934  MRN: [id number]  Age / Sex: 83 y.o., female  PCP: Hoyt Koch, MD Referring Physician: Aline August, MD  Reason for Consultation: Establishing goals of care  HPI/Patient Profile: 83 y.o. female  with past medical history of diastolic CHF, a fib, HLP, sleep apnea, COPD, lung ca, recurrent pleural effusion admitted on 10/30/2018 with SOB and R sided pleural effusion.  S/p thoracentesis.  Palliative consulted for [place].   Clinical Assessment and Goals of Care: I met today with [name].   I introduced palliative care as specialized medical care for people living with serious illness. It focuses on providing relief from the symptoms and stress of a serious illness. The goal is to improve quality of life for both the patient and the family.  She reports that she is feeling "just awful," but she has difficulty clarifying this further.  She does endorse feeling SOB when asked.  States "I am 83 years old.  This is awful.  Maybe I am dying."    She had a total of 8 children.  4 are living and 2 live in the area, which is why she just moved to the area from [place]. Is living at [facility] and reports that she is pleased with environment there.  We discussed clinical course as well as wishes moving forward in regard to advanced directives.  Concepts specific to code status and care this hospitalization discussed.   Questions and concerns addressed.   PMT will continue to support holistically.  SUMMARY OF RECOMMENDATIONS   - DNR/DNI - Continue current interventions and continue to reassess based upon her clinical course over the next few days.  She is also open to further discussion pending results of pleural fluid analysis if signs of return of her cancer.  Code Status/Advance Care Planning:  DNR   Symptom  Management:   Shortness of breath: Reports trying morphine as ordered and that current dose ineffective.  Plan trial of morphine 1-18m IV Q4 hours prn pain or SOB.  Palliative Prophylaxis:   Bowel Regimen, Delirium Protocol and Frequent Pain Assessment  Psycho-social/Spiritual:   Desire for further Chaplaincy support:no  Additional Recommendations: Caregiving  Support/Resources  Prognosis:   Guarded  Discharge Planning: To Be Determined      Primary Diagnoses: Present on Admission: . SIRS (systemic inflammatory response syndrome) (HCC) . Severe obstructive sleep apnea . Paroxysmal a-fib ([facility] . Essential hypertension . COPD exacerbation ([facility] . Chronic respiratory failure with hypoxia ([place], 2L home O2 . CAD (coronary artery disease) . Breast cancer ([facility] . Adenocarcinoma of upper lobe of right lung   I have reviewed the medical record, interviewed the patient and family, and examined the patient. The following aspects are pertinent.  Past Medical History:  Diagnosis Date  . AF (atrial fibrillation) ([facility]   . Anxiety   . Arthritis    "thighs, hips, arms" (04/28/2015)  . Breast cancer ([facility]    right mastectomy  .  CAD (coronary artery disease)   . Dehydration 12/06/2016  . Dementia ([place])   . Depression   . Diverticulitis   . GERD (gastroesophageal reflux disease)   . HCAP (healthcare-associated pneumonia) 09/15/2015  . Hyperlipidemia   . Hypertension   . Non-small cell carcinoma of lung, stage 3 ([place]) 04/30/2015  . Prediabetes   . Seasonal allergies   . Shortness of breath dyspnea   . Sleep apnea     dx'd 10/2014,refused cpap machine  . Urinary frequency   . UTI (urinary tract infection) 06/06/2015   Social History   Socioeconomic History  . Marital status: Widowed    Spouse name: Not on file  . Number of children: 4  . Years of education: 64  . Highest education level: Not on file  Occupational History  . Occupation: retired  Scientific laboratory technician  . Financial  resource strain: Not hard at all  . Food insecurity:    Worry: Never true    Inability: Never true  . Transportation needs:    Medical: No    Non-medical: No  Tobacco Use  . Smoking status: Former Smoker    Packs/day: 1.00    Years: 35.00    Pack years: 35.00    Types: Cigarettes    Last attempt to quit: 11/28/1992    Years since quitting: 25.9  . Smokeless tobacco: Never Used  . Tobacco comment: "stopped smoking at age 6"  Substance and Sexual Activity  . Alcohol use: Yes    Alcohol/week: 9.0 standard drinks    Types: 9 Glasses of wine per week    Comment: 04/28/2015 "drink 3, 6oz,  glasses of wine on Sat & Sun"  . Drug use: No  . Sexual activity: Never  Lifestyle  . Physical activity:    Days per week: 0 days    Minutes per session: 0 min  . Stress: To some extent  Relationships  . Social connections:    Talks on phone: More than three times a week    Gets together: More than three times a week    Attends religious service: Not on file    Active member of club or organization: Not on file    Attends meetings of clubs or organizations: Not on file    Relationship status: Widowed  Other Topics Concern  . Not on file  Social History Narrative   Fun: Word search and read   Denies abuse and feels safe at home   Family History  Problem Relation Age of Onset  . Mental illness Mother    Scheduled Meds: . apixaban  5 mg Oral BID  . atorvastatin  40 mg Oral q1800  . budesonide (PULMICORT) nebulizer solution  0.25 mg Nebulization BID  . digoxin  0.125 mg Oral Daily  . diltiazem  240 mg Oral Daily  . ferrous sulfate  325 mg Oral QPM  . fesoterodine  4 mg Oral Daily  . furosemide  80 mg Intravenous BID  . ipratropium-albuterol  3 mL Nebulization Q6H  . mouth rinse  15 mL Mouth Rinse BID  . mirtazapine  15 mg Oral QHS  . multivitamin with minerals  1 tablet Oral Daily  . pantoprazole  40 mg Oral Daily  . sertraline  50 mg Oral Daily   Continuous Infusions: . ceFEPime  (MAXIPIME) IV Stopped (11/02/18 0636)   PRN Meds:.acetaminophen **OR** acetaminophen, guaiFENesin-dextromethorphan, haloperidol lactate, ipratropium-albuterol, morphine injection, ondansetron **OR** ondansetron (ZOFRAN) IV, sodium chloride flush Medications Prior to Admission:  Prior  to Admission medications   Medication Sig Start Date End Date Taking? Authorizing Provider  albuterol (PROVENTIL HFA;VENTOLIN HFA) 108 (90 Base) MCG/ACT inhaler Inhale 1 puff into the lungs every 6 (six) hours as needed for wheezing or shortness of breath. Reported on 06/01/2016 11/15/16  Yes Golden Circle, FNP  apixaban (ELIQUIS) 5 MG TABS tablet Take 1 tablet (5 mg total) by mouth 2 (two) times daily. 10/08/18  Yes Pixie Casino, MD  atorvastatin (LIPITOR) 40 MG tablet TAKE 1 TABLET(40 MG) BY MOUTH DAILY 06/06/18  Yes Hoyt Koch, MD  DIGOX 125 MCG tablet TAKE 1 TABLET(0.125 MG) BY MOUTH DAILY Patient taking differently: Take 0.125 mg by mouth daily.  02/12/18  Yes Hilty, Nadean Corwin, MD  diltiazem (CARDIZEM CD) 240 MG 24 hr capsule Take 1 capsule (240 mg total) by mouth daily. 09/11/18  Yes Hilty, Nadean Corwin, MD  ferrous sulfate 325 (65 FE) MG tablet Take 325 mg by mouth every evening.    Yes [provider]  furosemide (LASIX) 40 MG tablet Take 1 tablet (40 mg total) by mouth 2 (two) times daily. Patient taking differently: Take 40 mg by mouth daily.  08/29/18 08/29/19 Yes Paticia Stack, MD  ipratropium-albuterol (DUONEB) 0.5-2.5 (3) MG/3ML SOLN Take 3 mLs by nebulization 4 (four) times daily as needed. Patient taking differently: Take 3 mLs by nebulization daily as needed (shortness of breath).  09/19/17  Yes Hoyt Koch, MD  mirtazapine (REMERON) 15 MG tablet TAKE 1 TABLET(15 MG) BY MOUTH AT BEDTIME 06/06/18  Yes Hoyt Koch, MD  Multiple Vitamins-Minerals (MULTIVITAMIN WITH MINERALS) tablet Take 1 tablet by mouth daily. gummie   Yes [provider]  NUTRITIONAL  SUPPLEMENT LIQD Take 120 mLs by mouth daily as needed (hydration). MedPass    Yes [provider]  OXYGEN Inhale 2 L into the lungs daily.    Yes [provider]  pantoprazole (PROTONIX) 40 MG tablet TAKE 1 TABLET(40 MG) BY MOUTH DAILY Patient taking differently: Take 40 mg by mouth daily.  10/08/18  Yes Hoyt Koch, MD  sertraline (ZOLOFT) 25 MG tablet Take 1-2 tablets (25-50 mg total) by mouth daily. Patient taking differently: Take 50 mg by mouth daily.  09/12/18  Yes Hoyt Koch, MD  tolterodine (DETROL LA) 4 MG 24 hr capsule Take 1 capsule (4 mg total) by mouth daily. 06/06/18  Yes Hoyt Koch, MD  atorvastatin (LIPITOR) 40 MG tablet TAKE 1 TABLET(40 MG) BY MOUTH DAILY Patient not taking: Reported on 10/30/2018 09/13/18   Hoyt Koch, MD  furosemide (LASIX) 20 MG tablet TAKE 1 TABLET(20 MG) BY MOUTH DAILY Patient not taking: Reported on 10/30/2018 10/08/18   Pixie Casino, MD  loratadine (CLARITIN) 10 MG tablet TAKE 1 TABLET BY MOUTH AT BEDTIME Patient not taking: TAKE 1 TABLET BY MOUTH AT BEDTIME 09/10/18   Hoyt Koch, MD  mirtazapine (REMERON) 15 MG tablet TAKE 1 TABLET(15 MG) BY MOUTH AT BEDTIME Patient not taking: Reported on 10/30/2018 09/14/18   Hoyt Koch, MD   Allergies  Allergen Reactions  . Influenza Vaccines Anaphylaxis    Patient allergic to eggs  . Eggs Or Egg-Derived Products Nausea And Vomiting   Review of Systems  Constitutional: Positive for activity change and fatigue.  Respiratory: Positive for shortness of breath.   Neurological: Positive for weakness.  Psychiatric/Behavioral: Positive for sleep disturbance. The patient is nervous/anxious.     Physical Exam  General: Alert, awake, anxious, frail.  HEENT: No bruits, no goiter Heart: Rate controlled. No murmur appreciated. Lungs: Decereased air movement, crackles scattered throughout Abdomen: Soft, nontender, nondistended, positive  bowel sounds.  Ext: No significant edema Skin: Warm and dry Neuro: Grossly intact, nonfocal.   Vital Signs: BP (!) 154/57   Pulse 76   Temp 98.2 F (36.8 C) (Oral)   Resp (!) 32   Ht _0  (1.676 m)   Wt 70 kg   LMP  (LMP Unknown)   SpO2 94%   BMI 24.91 kg/m  Pain Scale: 0-10   Pain Score: Asleep   SpO2: SpO2: 94 % O2 Device:SpO2: 94 % O2 Flow Rate: .O2 Flow Rate (L/min): 15 L/min  IO: Intake/output summary:   Intake/Output Summary (Last 24 hours) at 11/02/2018 0858 Last data filed at 11/02/2018 0800 Gross per 24 hour  Intake 1397.02 ml  Output 3125 ml  Net -1727.98 ml    LBM: Last BM Date: 11/01/18 Baseline Weight: Weight: 70.3 kg Most recent weight: Weight: 70 kg     Palliative Assessment/Data:   Flowsheet Rows     Most Recent Value  Intake Tab  Referral Department  Hospitalist  Unit at Time of Referral  ICU  Date Notified  10/31/18  Palliative Care Type  New Palliative care  Reason for referral  Clarify Goals of Care  Date of Admission  10/30/18  # of days IP prior to Palliative referral  1  Clinical Assessment  Psychosocial & Spiritual Assessment  Palliative Care Outcomes      Time In: 1520 Time Out: 1630 Time Total: 70 Greater than 50%  of this time was spent counseling and coordinating care related to the above assessment and plan.  Signed by: Micheline Rough, MD   Please contact Palliative Medicine Team phone at [phone] for questions and concerns.  For individual provider: See Shea Evans

## 2018-11-02 NOTE — Progress Notes (Signed)
Pharmacy Antibiotic Note  Jennafer Gladue is a 83 y.o. female admitted on 10/30/2018 with sepsis.  Pharmacy has been consulted for cefepime dosing (vancomycin dc'd).  Day #3 full abx Afeb WBC down 10.9 SCr 0.85 stable, CrCl ~49 ml/min PCT - PCT 0.1 <0.1 0.15  Plan: Continue Cefepime 2gm iv q8hr Monitor renal function and adjust if needed Follow up duration of therapy  Height: 5\' 6"  (167.6 cm) Weight: 154 lb 5.2 oz (70 kg) IBW/kg (Calculated) : 59.3  Temp (24hrs), Avg:98.5 F (36.9 C), Min:98.2 F (36.8 C), Max:98.8 F (37.1 C)  Recent Labs  Lab 10/30/18 1556 10/30/18 1603 10/30/18 1814 10/31/18 0057 10/31/18 0304 10/31/18 1554 11/01/18 0500 11/02/18 0336  WBC 16.0*  --   --   --  14.2*  --  11.0* 10.9*  CREATININE 1.04* 0.90  --   --  0.77 0.92  --  0.85  LATICACIDVEN  --  4.05* 2.10* 1.6 1.4  --   --   --     Estimated Creatinine Clearance: 48.6 mL/min (by C-G formula based on SCr of 0.85 mg/dL).    Allergies  Allergen Reactions  . Influenza Vaccines Anaphylaxis    Patient allergic to eggs  . Eggs Or Egg-Derived Products Nausea And Vomiting    Antimicrobials this admission:  12/3 Zosyn x1 12/3 Vancomycin >> 12/4 12/4 Cefepime  >> 124 Flagyl >> 12/4  Dose adjustments this admission:   Microbiology results:  12/3 BCx: ngtd 12/3 UCx: <10k insignificant growth 12/4 Sputum: acceptable, no organism on gram stain 12/3 MRSA PCR: neg 12/4 Flu PCR: neg/neg 12/4 Pleural fluid: ngtd  Thank you for allowing pharmacy to be a part of this patient's care.  Peggyann Juba, PharmD, BCPS Pager: [phone] 11/02/2018 8:46 AM

## 2018-11-02 NOTE — Progress Notes (Signed)
Patient ID: Pamela Mcguire, female   DOB: 05-09-1935, 83 y.o.   MRN: [id number]  PROGRESS NOTE    Pamela Mcguire  YQI:[id number] DOB: Dec 12, 1934 DOA: 10/30/2018 PCP: Hoyt Koch, MD   Brief Narrative:  83 year old female with history of diastolic CHF, atrial fibrillation, hyperlipidemia, sleep apnea, COPD presented with worsening shortness of breath and chest pain.  She was found to be hypotensive, tachycardic and febrile in the ED.  CT scan of the chest and abdomen showed large right-sided pleural effusion.  She was started on broad-spectrum antibiotics and IV fluids.  Subsequently she became more hypoxic requiring BiPAP.  PCCM was consulted.  She had right-sided thoracentesis done on 10/31/2018.   Assessment & Plan:   Principal Problem:   SIRS (systemic inflammatory response syndrome) (HCC) Active Problems:   Paroxysmal a-fib (HCC)   Essential hypertension   Severe obstructive sleep apnea   Adenocarcinoma of upper lobe of right lung   Breast cancer (HCC)   CAD (coronary artery disease)   COPD exacerbation (HCC)   Chronic respiratory failure with hypoxia ([place]), 2L home O2   Recurrent pleural effusion on right  Sepsis/SIRS most likely secondary to pneumonia: Present on admission -Continue cefepime.  Cultures negative so far.  Hemodynamically stable.  Acute hypoxic respite failure secondary to pneumonia/bilateral pleural effusion/pulmonary edema -Patient required BiPAP for little while on 10/31/2018.   -Patient is status worse this morning.  Currently on 15 L oxygen via nasal cannula.  Will get repeat stat portable chest x-ray.  PCCM following -Overall prognosis is guarded.  Palliative care following.  If condition worsens, consider hospice/comfort measures  Bilateral pleural effusion status post right-sided thoracentesis and subsequent right-sided small pneumothorax -Patient had right-sided thoracentesis and 2 L fluid removed on 10/31/2018 and subsequently CT scan of the chest showed  small right-sided pneumothorax and moderate left-sided pleural effusion along with consolidation on the left side -PCCM following.  Will follow recommendations.  Unclear if patient would benefit from left-sided thoracentesis as well. -Increase Lasix to 80 mg IV every 8 hours.  Community-acquired bacterial multilobar pneumonia -Continue cefepime.  Cultures negative so far.  Procalcitonin is 0.15.  Strep pneumoniae antigen is negative  Acute on chronic diastolic CHF -EF in September 2019 was 60 to 65%.  Initially Lasix was held because of low blood pressure but Lasix has been restarted.  Will increase Lasix to 80 mg IV every 8 hours.  Strict input and output.  Daily weights.  Outpatient follow-up with cardiology  Chest pain with only minimally elevated troponin -Most likely secondary to above.  Management as above  COPD with mild exacerbation -Patient received 1 dose of Solu-Medrol on 10/31/2018.  Has mild wheezing.  Will add Solu-Medrol 60 mg IV every 8 hours because of worsening hypoxia.  Continue nebs and Pulmicort next  Paroxysmal A. fib -Rate controlled.  Continue Eliquis.  Continue digoxin and Cardizem  Leukocytosis -Resolved  Hypokalemia -Replaced  Hypomagnesemia -Replaced.  History of lung cancer, breast cancer and colon cancer in remission   DVT prophylaxis: Eliquis Code Status: DNR Family Communication: None at bedside Disposition Plan: Depends on clinical outcome  Consultants: PCCM/palliative care  Procedures: Right-sided thoracentesis on 10/31/2018 by PCCM  Antimicrobials: Cefepime from 10/30/2018 onwards Vancomycin 10/30/2018-10/31/2018   Subjective: Patient seen and examined at bedside.  She still feels lousy.  Feels very weak and tired.  Intermittently extremely short of breath.  Appetite is poor.  No overnight fever or vomiting. Objective: Vitals:   11/02/18 0600 11/02/18 0758 11/02/18 0800 11/02/18  0845  BP: (!) 128/52  (!) 154/57   Pulse: 83  89 76  Resp:  (!) 26  (!) 32   Temp:   98.3 F (36.8 C)   TempSrc:   Oral   SpO2: 93% 91% 94%   Weight:      Height:        Intake/Output Summary (Last 24 hours) at 11/02/2018 0914 Last data filed at 11/02/2018 0800 Gross per 24 hour  Intake 1397.02 ml  Output 2450 ml  Net -1052.98 ml   Filed Weights   10/31/18 0120 11/01/18 0240 11/02/18 0500  Weight: 74 kg 71.3 kg 70 kg    Examination:  General exam: Appears ill looking but not in acute distress.  Intermittent tachypneic Respiratory system: Bilateral decreased breath sounds at bases with scattered crackles, mild scattered wheezing.  Intermittent tachypnea.  No accessory muscle use Cardiovascular system: Rate controlled, S1-S2 heard Gastrointestinal system: Abdomen is nondistended, soft and nontender. Normal bowel sounds heard. Extremities: No cyanosis; trace edema Central nervous system: Alert and oriented. No focal neurological deficits. Moving extremities Skin: No rashes or ulcers Psychiatry: Anxious looking  Data Reviewed: I have personally reviewed following labs and imaging studies  CBC: Recent Labs  Lab 10/30/18 1556 10/30/18 1603 10/31/18 0304 11/01/18 0500 11/02/18 0336  WBC 16.0*  --  14.2* 11.0* 10.9*  NEUTROABS 14.6*  --  12.2*  --   --   HGB 11.9* 12.6 11.3* 10.1* 11.6*  HCT 36.3 37.0 35.8* 31.7* 38.5  MCV 93.6  --  96.5 96.1 96.0  PLT 298  --  298 290 811   Basic Metabolic Panel: Recent Labs  Lab 10/30/18 1556 10/30/18 1603 10/31/18 0304 10/31/18 1554 11/02/18 0336  NA 135 134* 139 136 136  K 3.0* 3.0* 2.9* 3.0* 4.5  CL 92* 89* 103 96* 96*  CO2 32  --  28 29 27   GLUCOSE 162* 161* 147* 117* 113*  BUN 13 12 11 11 10   CREATININE 1.04* 0.90 0.77 0.92 0.85  CALCIUM 8.0*  --  6.9* 6.7* 7.6*  MG  --   --   --  1.1* 2.3   GFR: Estimated Creatinine Clearance: 48.6 mL/min (by C-G formula based on SCr of 0.85 mg/dL). Liver Function Tests: Recent Labs  Lab 10/30/18 1556 10/31/18 0304 10/31/18 1215  AST 23  17  --   ALT 15 14  --   ALKPHOS 54 56  --   BILITOT 1.3* 1.1  --   PROT 6.2* 5.7* 6.2*  ALBUMIN 3.1* 2.8*  --    Recent Labs  Lab 10/30/18 1556  LIPASE 28   No results for input(s): AMMONIA in the last 168 hours. Coagulation Profile: Recent Labs  Lab 10/31/18 0048  INR 1.67   Cardiac Enzymes: Recent Labs  Lab 10/31/18 0048 10/31/18 0606 10/31/18 1215  TROPONINI 0.04* 0.04* 0.05*   BNP (last 3 results) Recent Labs    09/12/18 1130  PROBNP 175.0*   HbA1C: No results for input(s): HGBA1C in the last 72 hours. CBG: No results for input(s): GLUCAP in the last 168 hours. Lipid Profile: Recent Labs    10/31/18 1215  CHOL 153   Thyroid Function Tests: No results for input(s): TSH, T4TOTAL, FREET4, T3FREE, THYROIDAB in the last 72 hours. Anemia Panel: No results for input(s): VITAMINB12, FOLATE, FERRITIN, TIBC, IRON, RETICCTPCT in the last 72 hours. Sepsis Labs: Recent Labs  Lab 10/30/18 1603 10/30/18 1814 10/31/18 9147 10/31/18 0057 10/31/18 0304 11/01/18 1547 11/02/18 8295  PROCALCITON  --   --  0.10  --   --  <0.10 0.15  LATICACIDVEN 4.05* 2.10*  --  1.6 1.4  --   --     Recent Results (from the past 240 hour(s))  Blood Culture (routine x 2)     Status: None (Preliminary result)   Collection Time: 10/30/18  4:08 PM  Result Value Ref Range Status   Specimen Description   Final    BLOOD RIGHT ARM Performed at [facility] [address], [place], [place] [address]    Special Requests   Final    BOTTLES DRAWN AEROBIC AND ANAEROBIC Blood Culture adequate volume Performed at [facility] [address] [place], [place] [address]    Culture   Final    NO GROWTH 3 DAYS Performed at [facility], [place] [address], [place], [place] [address]    Report Status PENDING  Incomplete  Urine culture     Status: Abnormal   Collection Time: 10/30/18  5:26 PM  Result Value Ref Range Status   Specimen Description    Final    URINE, RANDOM Performed at [facility] [address], [place], [place] [address]    Special Requests   Final    NONE Performed at [facility], [place] [address] [place], [place] [address]    Culture (A)  Final    <10,000 COLONIES/mL INSIGNIFICANT GROWTH Performed at [facility] [address], [place], [place] [address]    Report Status 11/01/2018 FINAL  Final  MRSA PCR Screening     Status: None   Collection Time: 10/30/18 11:55 PM  Result Value Ref Range Status   MRSA by PCR NEGATIVE NEGATIVE Final    Comment:        The GeneXpert MRSA Assay (FDA approved for NASAL specimens only), is one component of a comprehensive MRSA colonization surveillance program. It is not intended to diagnose MRSA infection nor to guide or monitor treatment for MRSA infections. Performed at [facility], [place] [address] [place], [place] [address]   Expectorated sputum assessment w rflx to resp cult     Status: None   Collection Time: 10/31/18  1:26 AM  Result Value Ref Range Status   Specimen Description SPUTUM  Final   Special Requests NONE  Final   Sputum evaluation   Final    THIS SPECIMEN IS ACCEPTABLE FOR SPUTUM CULTURE Performed at [facility], [place] [address] [place], [place] [address]    Report Status 10/31/2018 FINAL  Final  Culture, respiratory     Status: None (Preliminary result)   Collection Time: 10/31/18  1:26 AM  Result Value Ref Range Status   Specimen Description   Final    SPUTUM Performed at [facility] [address] [place] [address]    Special Requests   Final    NONE Reflexed from [id number] Performed at [facility], [place] [address] [place], [place] [address]    Gram Stain   Final    ABUNDANT WBC PRESENT, PREDOMINANTLY PMN NO ORGANISMS SEEN    Culture   Final    CULTURE REINCUBATED FOR BETTER GROWTH Performed at [facility], [place] [address] [place], [place] [address]    Report Status PENDING  Incomplete  Blood Culture (routine x 2)     Status: None (Preliminary result)   Collection Time: 10/31/18  3:04 AM  Result Value Ref Range Status   Specimen Description   Final    BLOOD RIGHT HAND Performed at [facility], [place] [address] [place], [place] [address]    Special Requests   Final    AEROBIC BOTTLE ONLY Blood Culture adequate volume Performed at [facility] [address] [place], [place] [address]    Culture   Final    NO GROWTH 2 DAYS Performed at [facility] [address] [place], [place] [address]    Report Status PENDING  Incomplete  Body fluid culture     Status: None (Preliminary result)   Collection Time: 10/31/18 10:20 AM  Result Value Ref Range Status   Specimen Description   Final    PLEURAL RIGHT Performed at [facility] [address] [place], [place] [address]    Special Requests   Final    NONE Performed at [facility], [place] [address] [place], [place] [address]    Gram Stain   Final    WBC PRESENT, PREDOMINANTLY PMN NO ORGANISMS SEEN CYTOSPIN SMEAR    Culture   Final    NO GROWTH 2 DAYS Performed at [facility], [place] [address] [place],  [address]    Report Status PENDING  Incomplete         Radiology Studies: Ct Chest Wo Contrast  Result Date: 10/31/2018 CLINICAL DATA:  Non-small cell lung cancer with locoregional recurrence. Assess response to therapy. EXAM: CT CHEST WITHOUT CONTRAST TECHNIQUE: Multidetector CT imaging of the chest was performed following the standard protocol without IV contrast. COMPARISON:  10/30/2018 FINDINGS: Cardiovascular: Mild cardiac enlargement. Extensive aortic atherosclerosis. Three vessel coronary artery atherosclerotic calcifications noted. Mediastinum/Nodes: Small low-attenuation thyroid nodules are again noted. The trachea appears  patent and is midline. Normal appearance of the esophagus. No axillary or supraclavicular adenopathy. High right paratracheal lymph node measures 0.9 cm, image 45/3. Unchanged from previous exam. Left pre-vascular lymph node measures 1.1 cm, image 52/3. Unchanged from previous exam. Subcarinal lymph node is enlarged measuring 1.6 cm, image 70/3. Unchanged. The hilar regions are suboptimally evaluated due to lack of IV contrast material. Lungs/Pleura: Moderate to large right pleural effusion is again noted. This is decreased in size when compared with the previous exam status post thoracentesis. Tiny right anterior and medial pneumothorax is identified overlying the right lower lobe and right middle lobe. There is a new small to moderate left pleural effusion identified. Progressive multifocal bilateral areas of airspace consolidation with surrounding ground-glass attenuation are identified bilaterally. This includes a new area within the posterior and lateral left upper lobe, image, image 44/8. Also new, is dense airspace consolidation and atelectasis within the left lower lobe overlying the new left pleural effusion, image 113/7. Masslike architectural distortion and fibrosis within the paramediastinal right lung is identified compatible with treatment related changes secondary to external beam radiation, image 57/8. Within the anterior right lower lobe there is a focal area of persistent and mildly progressive nodularity measuring 1.8 by 1.1 cm, image 33/6. On 01/02/2018 this measured 1.4 by 1.0 cm. Nonspecific nodularity within the right middle lobe is identified on image 93/8. This may be postinflammatory or infectious in etiology. Upper Abdomen: No acute abnormality. Musculoskeletal: No suspicious bone lesions identified.Compression fracture involving the L1 vertebra is unchanged. IMPRESSION: 1. Interval decrease in volume of right pleural effusion status post thoracentesis. There is a tiny right right-sided  pneumothorax. 2. Interval development of  moderate left pleural effusion. 3. Interval development of dense left lower lobe airspace consolidation and patchy areas of consolidation within the left upper lobe with surrounding areas of ground-glass attenuation. These are new when compared with 10/30/2018 and are favored to represent progressive changes of inflammation and/or infection. A nodular density within the anterior right lower lobe is a noted and demonstrates mild increase in size compared with 01/02/2018. This is nonspecific. Residual/recurrence of tumor is not excluded. Masslike architectural distortion and fibrosis within the right upper lung is again noted and is presumed to represent post treatment changes. 4. No significant change in prominent right paratracheal and prevascular lymph nodes and enlarged subcarinal lymph node. In the setting of infection and/or CHF this is a nonspecific finding. Metastatic adenopathy cannot be excluded. Electronically Signed   By: Kerby Moors M.D.   On: 10/31/2018 16:20   Dg Chest Port 1 View  Addendum Date: 10/31/2018   ADDENDUM REPORT: 10/31/2018 12:16 ADDENDUM: Trace right pneumothorax discussed by telephone with Dr. Salvadore Dom on 10/31/2018 at 1149 hours. Electronically Signed   By: Genevie Ann M.D.   On: 10/31/2018 12:16   Result Date: 10/31/2018 CLINICAL DATA:  83 year old female status post thoracentesis. EXAM: PORTABLE CHEST 1 VIEW COMPARISON:  0740 hours today and earlier. FINDINGS: Portable AP semi upright view at 1043 hours. Substantially reduced right pleural effusion and improved right lung ventilation. There is a trace right apical pneumothorax visible. There is right upper lobe and persistent left lung reticulonodular opacity which is nonspecific. Dense retrocardiac opacity with obscuration of the left hemidiaphragm has increased since 10/30/2018. Calcified aortic atherosclerosis. IMPRESSION: 1. Substantially reduced right pleural effusion and improved  right lung ventilation following thoracentesis. There is a trace right apical pneumothorax. 2. Nonspecific bilateral reticulonodular opacity and increased left lower lobe collapse or consolidation since 10/30/2018. Electronically Signed: By: Genevie Ann M.D. On: 10/31/2018 11:44        Scheduled Meds: . apixaban  5 mg Oral BID  . atorvastatin  40 mg Oral q1800  . budesonide (PULMICORT) nebulizer solution  0.25 mg Nebulization BID  . digoxin  0.125 mg Oral Daily  . diltiazem  240 mg Oral Daily  . ferrous sulfate  325 mg Oral QPM  . fesoterodine  4 mg Oral Daily  . furosemide  80 mg Intravenous BID  . ipratropium-albuterol  3 mL Nebulization Q6H  . mouth rinse  15 mL Mouth Rinse BID  . mirtazapine  15 mg Oral QHS  . multivitamin with minerals  1 tablet Oral Daily  . pantoprazole  40 mg Oral Daily  . sertraline  50 mg Oral Daily   Continuous Infusions: . ceFEPime (MAXIPIME) IV Stopped (11/02/18 0636)     LOS: 3 days        Aline August, MD [facility] Pager [phone]  If 7PM-7AM, please contact night-coverage [email or web address] Password [email or web address] 11/02/2018, 9:14 AM

## 2018-11-02 NOTE — Care Management Note (Signed)
Case Management Note  Patient Details  Name: Pamela Mcguire MRN: [id number] Date of Birth: [birth year]  Subjective/Objective:                  pna and large rt pleural effusion s/p thoracentesis  Action/Plan: Will follow for progression of care and clinical status. Will follow for case management needs none present at this time.  Expected Discharge Date:  (unknown)               Expected Discharge Plan:  Assisted Living / Rest Home  In-House Referral:  Clinical Social Work  Discharge planning Services  CM Consult  Post Acute Care Choice:    Choice offered to:     DME Arranged:    DME Agency:     HH Arranged:    HH Agency:     Status of Service:  In process, will continue to follow  If discussed at Long Length of Stay Meetings, dates discussed:    Additional Comments:  Leeroy Cha, RN 11/02/2018, 9:36 AM

## 2018-11-03 DIAGNOSIS — J13 Pneumonia due to Streptococcus pneumoniae: Secondary | ICD-10-CM

## 2018-11-03 LAB — ADENOSIDE DEAMINASE, PLEURAL FL: ADENOSIDE DEAMINASE, PLEURAL FL: 2.1 U/L (ref 0.0–9.4)

## 2018-11-03 LAB — CBC
HCT: 36.6 % (ref 36.0–46.0)
Hemoglobin: 11.8 g/dL — ABNORMAL LOW (ref 12.0–15.0)
MCH: 30.3 pg (ref 26.0–34.0)
MCHC: 32.2 g/dL (ref 30.0–36.0)
MCV: 93.8 fL (ref 80.0–100.0)
Platelets: 393 10*3/uL (ref 150–400)
RBC: 3.9 MIL/uL (ref 3.87–5.11)
RDW: 13.5 % (ref 11.5–15.5)
WBC: 12.6 10*3/uL — ABNORMAL HIGH (ref 4.0–10.5)
nRBC: 0 % (ref 0.0–0.2)

## 2018-11-03 LAB — BASIC METABOLIC PANEL
Anion gap: 13 (ref 5–15)
BUN: 16 mg/dL (ref 8–23)
CO2: 28 mmol/L (ref 22–32)
CREATININE: 0.88 mg/dL (ref 0.44–1.00)
Calcium: 7.9 mg/dL — ABNORMAL LOW (ref 8.9–10.3)
Chloride: 94 mmol/L — ABNORMAL LOW (ref 98–111)
GFR calc non Af Amer: >60 mL/min (ref >60)
Glucose, Bld: 268 mg/dL — ABNORMAL HIGH (ref 70–99)
Potassium: 3.6 mmol/L (ref 3.5–5.1)
Sodium: 135 mmol/L (ref 135–145)

## 2018-11-03 LAB — BODY FLUID CULTURE: CULTURE: NO GROWTH

## 2018-11-03 LAB — PROCALCITONIN

## 2018-11-03 LAB — MAGNESIUM: Magnesium: 1.9 mg/dL (ref 1.7–2.4)

## 2018-11-03 MED ORDER — IPRATROPIUM-ALBUTEROL 0.5-2.5 (3) MG/3ML IN SOLN
3.0000 mL | Freq: Three times a day (TID) | RESPIRATORY_TRACT | Status: DC
  Administered 2018-11-04 – 2018-11-12 (×24): 3 mL via RESPIRATORY_TRACT
  Filled 2018-11-03 (×25): qty 3

## 2018-11-03 NOTE — Progress Notes (Signed)
Bipap not indicated at this time. 

## 2018-11-03 NOTE — Progress Notes (Signed)
Patient ID: Pamela Mcguire, female   DOB: 05-19-1935, 83 y.o.   MRN: [id number]  PROGRESS NOTE    Pamela Mcguire  VOH:[id number] DOB: 1935/06/29 DOA: 10/30/2018 PCP: Hoyt Koch, MD   Brief Narrative:  83 year old female with history of diastolic CHF, atrial fibrillation, hyperlipidemia, sleep apnea, COPD presented with worsening shortness of breath and chest pain.  She was found to be hypotensive, tachycardic and febrile in the ED.  CT scan of the chest and abdomen showed large right-sided pleural effusion.  She was started on broad-spectrum antibiotics and IV fluids.  Subsequently she became more hypoxic requiring BiPAP.  PCCM was consulted.  She had right-sided thoracentesis done on 10/31/2018.   Assessment & Plan:   Principal Problem:   SIRS (systemic inflammatory response syndrome) (HCC) Active Problems:   Paroxysmal a-fib (HCC)   Essential hypertension   Severe obstructive sleep apnea   Adenocarcinoma of upper lobe of right lung   Breast cancer (HCC)   CAD (coronary artery disease)   COPD exacerbation (HCC)   Chronic respiratory failure with hypoxia ([place]), 2L home O2   Recurrent pleural effusion on right   Acute respiratory failure with hypoxia (HCC)  Sepsis/SIRS most likely secondary to pneumonia: Present on admission -Continue cefepime.  Cultures negative so far.  Hemodynamically stable.  Acute hypoxic respite failure secondary to pneumonia/bilateral pleural effusion/pulmonary edema -Patient required BiPAP for little while on 10/31/2018.   -Respiratory status improved compared to yesterday.  Currently on 6 L oxygen via nasal cannula.  Repeat CXR from 11/02/18 showed worsening Right pleural effusion.  PCCM following -Overall prognosis is guarded.  Palliative care following.  If condition worsens, consider hospice/comfort measures  Bilateral pleural effusion status post right-sided thoracentesis and subsequent right-sided small pneumothorax -Patient had right-sided  thoracentesis and 2 L fluid removed on 10/31/2018 and subsequently CT scan of the chest showed small right-sided pneumothorax and moderate left-sided pleural effusion along with consolidation on the left side -PCCM following.  Will follow recommendations.  Unclear if patient would benefit from left-sided thoracentesis as well or repeat Right thoracentesis. -continue Lasix  80 mg IV every 8 hours.  Community-acquired bacterial multilobar pneumonia -Continue cefepime to finish 5-7 day course of antibiotics.  Cultures negative so far.    Strep pneumoniae antigen is negative  Acute on chronic diastolic CHF -EF in September 2019 was 60 to 65%.  Continue Lasix  80 mg IV every 8 hours.  Strict input and output.  Daily weights.  Outpatient follow-up with cardiology  Chest pain with only minimally elevated troponin -Most likely secondary to above.  Management as above  COPD with mild exacerbation -Decrease Solumedrol to 40mg  iv every 8 hrs.  Continue nebs and Pulmicort next  Paroxysmal A. fib -Rate controlled.  Continue Eliquis.  Continue digoxin and Cardizem  Leukocytosis -Resolved  Hypokalemia -Replaced  Hypomagnesemia -Replaced.   History of lung cancer, breast cancer and colon cancer in remission   DVT prophylaxis: Eliquis Code Status: DNR Family Communication: None at bedside Disposition Plan: Depends on clinical outcome  Consultants: PCCM/palliative care  Procedures: Right-sided thoracentesis on 10/31/2018 by PCCM  Antimicrobials: Cefepime from 10/30/2018 onwards Vancomycin 10/30/2018-10/31/2018   Subjective: Patient seen and examined at bedside.  Feels extremely weak and not better at all. Still short of breath with minimal exertion. No fever or vomiting.  Objective: Vitals:   11/03/18 0500 11/03/18 0600 11/03/18 0800 11/03/18 0820  BP: (!) 90/49 (!) 102/45    Pulse: 64 68    Resp: (!) 22 20  Temp:   97.7 F (36.5 C)   TempSrc:   Oral   SpO2: 96% 95%  98%  Weight:       Height:        Intake/Output Summary (Last 24 hours) at 11/03/2018 0838 Last data filed at 11/03/2018 5852 Gross per 24 hour  Intake 436.73 ml  Output 2075 ml  Net -1638.27 ml   Filed Weights   11/01/18 0240 11/02/18 0500 11/03/18 0430  Weight: 71.3 kg 70 kg 74.8 kg    Examination:  General exam: Appears ill looking but not in distress.  Intermittently tachypneic Respiratory system: Bilateral decreased breath sounds at bases with scattered crackles, mild  wheezing.  Cardiovascular system: Rate controlled, S1-S2 heard Gastrointestinal system: Abdomen is nondistended, soft and nontender. Normal bowel sounds heard. Extremities: No cyanosis; trace edema   Data Reviewed: I have personally reviewed following labs and imaging studies  CBC: Recent Labs  Lab 10/30/18 1556 10/30/18 1603 10/31/18 0304 11/01/18 0500 11/02/18 0336 11/03/18 0325  WBC 16.0*  --  14.2* 11.0* 10.9* 12.6*  NEUTROABS 14.6*  --  12.2*  --   --   --   HGB 11.9* 12.6 11.3* 10.1* 11.6* 11.8*  HCT 36.3 37.0 35.8* 31.7* 38.5 36.6  MCV 93.6  --  96.5 96.1 96.0 93.8  PLT 298  --  298 290 352 778   Basic Metabolic Panel: Recent Labs  Lab 10/30/18 1556 10/30/18 1603 10/31/18 0304 10/31/18 1554 11/02/18 0336 11/03/18 0325  NA 135 134* 139 136 136 135  K 3.0* 3.0* 2.9* 3.0* 4.5 3.6  CL 92* 89* 103 96* 96* 94*  CO2 32  --  28 29 27 28   GLUCOSE 162* 161* 147* 117* 113* 268*  BUN 13 12 11 11 10 16   CREATININE 1.04* 0.90 0.77 0.92 0.85 0.88  CALCIUM 8.0*  --  6.9* 6.7* 7.6* 7.9*  MG  --   --   --  1.1* 2.3 1.9   GFR: Estimated Creatinine Clearance: 51.8 mL/min (by C-G formula based on SCr of 0.88 mg/dL). Liver Function Tests: Recent Labs  Lab 10/30/18 1556 10/31/18 0304 10/31/18 1215  AST 23 17  --   ALT 15 14  --   ALKPHOS 54 56  --   BILITOT 1.3* 1.1  --   PROT 6.2* 5.7* 6.2*  ALBUMIN 3.1* 2.8*  --    Recent Labs  Lab 10/30/18 1556  LIPASE 28   No results for input(s): AMMONIA in  the last 168 hours. Coagulation Profile: Recent Labs  Lab 10/31/18 0048  INR 1.67   Cardiac Enzymes: Recent Labs  Lab 10/31/18 0048 10/31/18 0606 10/31/18 1215  TROPONINI 0.04* 0.04* 0.05*   BNP (last 3 results) Recent Labs    09/12/18 1130  PROBNP 175.0*   HbA1C: No results for input(s): HGBA1C in the last 72 hours. CBG: No results for input(s): GLUCAP in the last 168 hours. Lipid Profile: Recent Labs    10/31/18 1215  CHOL 153   Thyroid Function Tests: No results for input(s): TSH, T4TOTAL, FREET4, T3FREE, THYROIDAB in the last 72 hours. Anemia Panel: No results for input(s): VITAMINB12, FOLATE, FERRITIN, TIBC, IRON, RETICCTPCT in the last 72 hours. Sepsis Labs: Recent Labs  Lab 10/30/18 1603 10/30/18 1814 10/31/18 0048 10/31/18 0057 10/31/18 0304 11/01/18 1547 11/02/18 0336 11/03/18 0325  PROCALCITON  --   --  0.10  --   --  <0.10 0.15 <0.10  LATICACIDVEN 4.05* 2.10*  --  1.6 1.4  --   --   --  Recent Results (from the past 240 hour(s))  Blood Culture (routine x 2)     Status: None (Preliminary result)   Collection Time: 10/30/18  4:08 PM  Result Value Ref [place] Status   Specimen Description   Final    BLOOD RIGHT ARM Performed at [facility] [address], [place], [place] [address]    Special Requests   Final    BOTTLES DRAWN AEROBIC AND ANAEROBIC Blood Culture adequate volume Performed at [facility] [address] [place], [place] [address]    Culture   Final    NO GROWTH 3 DAYS Performed at [facility], [place] [address], [place], [place] [address]    Report Status PENDING  Incomplete  Urine culture     Status: Abnormal   Collection Time: 10/30/18  5:26 PM  Result Value Ref [place] Status   Specimen Description   Final    URINE, RANDOM Performed at [facility] [address], [place], [place] [address]    Special Requests   Final    NONE Performed at [facility], [place] [address] [place], [place] [address]    Culture (A)  Final    <10,000 COLONIES/mL INSIGNIFICANT GROWTH Performed at [facility] [address] [place], [place] [address]    Report Status 11/01/2018 FINAL  Final  MRSA PCR Screening     Status: None   Collection Time: 10/30/18 11:55 PM  Result Value Ref [place] Status   MRSA by PCR NEGATIVE NEGATIVE Final    Comment:        The GeneXpert MRSA Assay (FDA approved for NASAL specimens only), is one component of a comprehensive MRSA colonization surveillance program. It is not intended to diagnose MRSA infection nor to guide or monitor treatment for MRSA infections. Performed at [facility], [place] [address], [place], [place] [address]   Expectorated sputum assessment w rflx to resp cult     Status: None   Collection Time: 10/31/18  1:26 AM  Result Value Ref [place] Status   Specimen Description SPUTUM  Final   Special Requests NONE  Final   Sputum evaluation   Final    THIS SPECIMEN IS ACCEPTABLE FOR SPUTUM CULTURE Performed at [facility], [place] [address], [place], [place] [address]    Report Status 10/31/2018 FINAL  Final  Culture, respiratory     Status: None   Collection Time: 10/31/18  1:26 AM  Result Value Ref [place] Status   Specimen Description   Final    SPUTUM Performed at [facility] [address] [place], [place] [address]    Special Requests   Final    NONE Reflexed from [id number] Performed at [facility], [place] [address], [place], [place] [address]    Gram Stain   Final    ABUNDANT WBC PRESENT, PREDOMINANTLY PMN NO ORGANISMS SEEN    Culture   Final    RARE CANDIDA ALBICANS RARE HAEMOPHILUS INFLUENZAE BETA LACTAMASE NEGATIVE Performed at [facility], [place] [address], [place],  [address]    Report Status 11/02/2018 FINAL  Final  Blood Culture (routine x 2)     Status: None (Preliminary  result)   Collection Time: 10/31/18  3:04 AM  Result Value Ref [place] Status   Specimen Description   Final    BLOOD RIGHT HAND Performed at [facility], [place]  [address], [place], [place] [address]    Special Requests   Final    AEROBIC BOTTLE ONLY Blood Culture adequate volume Performed at [facility] [address], [place], [place] [address]    Culture   Final    NO GROWTH 2 DAYS Performed at [facility] [address], [place], [place] [address]    Report Status PENDING  Incomplete  Body fluid culture     Status: None (Preliminary result)   Collection Time: 10/31/18 10:20 AM  Result Value Ref [place] Status   Specimen Description   Final    PLEURAL RIGHT Performed at [facility] [address], [place], [place] [address]    Special Requests   Final    NONE Performed at [facility], [place] [address] [place], [place] [address]    Gram Stain   Final    WBC PRESENT, PREDOMINANTLY PMN NO ORGANISMS SEEN CYTOSPIN SMEAR    Culture   Final    NO GROWTH 2 DAYS Performed at [facility], [place] [address] [place], [place] [address]    Report Status PENDING  Incomplete         Radiology Studies: Dg Chest Port 1 View  Result Date: 11/02/2018 CLINICAL DATA:  Cough, chest congestion, and shortness of breath. History of previous episodes of pneumonia, right lung malignancy, breast malignancy, COPD. Former smoker. EXAM: PORTABLE CHEST 1 VIEW COMPARISON:  Chest x-ray of October 31, 2018 and chest CT scan of the same date FINDINGS: Since the previous study there has developed a larger right pleural effusion with fluid now lying along the superior and lateral aspects of the right lung as well as inferiorly. The tiny right apical pneumothorax has resolved. On the left the interstitial markings are coarse but fairly stable. There is a small left pleural effusion which is stable. The right heart border is largely  obscured. The heart does not appear significantly enlarged. The pulmonary vascularity is indistinct but not clearly engorged. There is calcification in the wall of the aortic arch. There is multilevel degenerative disc disease of the thoracic spine. IMPRESSION: Interval worsening in the appearance of the right hemithorax with increased volume of pleural fluid present. Stable appearance of densities in the left upper and left lower lobes and the small left pleural effusion. Electronically Signed   By: David  Martinique M.D.   On: 11/02/2018 09:33        Scheduled Meds: . apixaban  5 mg Oral BID  . atorvastatin  40 mg Oral q1800  . budesonide (PULMICORT) nebulizer solution  0.25 mg Nebulization BID  . digoxin  0.125 mg Oral Daily  . diltiazem  240 mg Oral Daily  . ferrous sulfate  325 mg Oral QPM  . fesoterodine  4 mg Oral Daily  . furosemide  80 mg Intravenous TID  . ipratropium-albuterol  3 mL Nebulization Q6H  . mouth rinse  15 mL Mouth Rinse BID  . methylPREDNISolone (SOLU-MEDROL) injection  40 mg Intravenous Q12H  . mirtazapine  15 mg Oral QHS  . multivitamin with minerals  1 tablet Oral Daily  . pantoprazole  40 mg Oral Daily  . sertraline  50 mg Oral Daily   Continuous Infusions: . ceFEPime (MAXIPIME) IV 2 g (11/03/18 4580)     LOS: 4 days        Aline August, MD [facility] Pager [phone]  If 7PM-7AM, please contact night-coverage [email or web address] Password [facility] 11/03/2018, 8:38 AM

## 2018-11-03 NOTE — Progress Notes (Signed)
NAME:  Pamela Mcguire, MRN:  [id number], DOB:  [birth year], LOS: 4 ADMISSION DATE:  10/30/2018, CONSULTATION DATE:  12/4 REFERRING MD:  [name], CHIEF COMPLAINT:  Acute hypoxic respiratory failure    Brief History   83 year old woman with stage IIIa adenocarcinoma status post right upper lobectomy and chemoradiation who had a recurrent right pleural effusion that was tapped twice in 05/2018 and again in 07/2018 with negative cytology.  She is also a colon cancer survivor and has a history of chronic atrial fibrillation and diastolic heart failure. She was admitted 12/3 with shortness of breath, fever and hypoxia, CT chest showing large right effusion,  Significant Hospital Events   12/4 admitted on 12/4 w/ working dx of large right effusion PCCM consulted for acute worsening of respiratory failure. Bed side thoracentesis completed. 2 liters of exudative appearing fluid obtained. 12/6 effusion worse again. Remains volume overloaded. Adjusting diuretics. Still awaiting pleural cholesterol. Treating as a mix of PNA and volume overload/edema   Consults:  Pulm   Procedures:  Right thoracentesis 2 liters cloudy orange appearing fluid removed. 12/4  Significant Diagnostic Tests:  CT chest and abd 12/4: neg for PE, mod to large right effusion, chronic stable subpleural reticulations, rounded opacity right heart border. CT abd: w/ sub centimeter left adrenal nodule   CT chest post thora 12/4 -small apical right pneumothorax, left lower lobe airspace disease/consolidation, moderate left effusion  Micro Data:  BCX 12/4> ng resp culture 12/4: >>>rare hemophilus Pleural fluid 12/4>>>ng Strep antigen 12/5: neg Legionella antigen 12/5>>>neg  Antimicrobials:  Cefepime 12/4>>> Metronidazole 12/4>>> 12/4 vanc 12/4>>> 12/4  Interim history/subjective:  Able to lie supine. On lower oxygen about 6 L nasal Breathing better, no chest pain    Objective   Blood pressure (!) 117/46, pulse 73, temperature  (!) 97.4 F (36.3 C), temperature source Oral, resp. rate (!) 34, height 5\' 6"  (1.676 m), weight 74.8 kg, SpO2 98 %.        Intake/Output Summary (Last 24 hours) at 11/03/2018 1245 Last data filed at 11/03/2018 1145 Gross per 24 hour  Intake 540.01 ml  Output 2325 ml  Net -1784.99 ml   Filed Weights   11/01/18 0240 11/02/18 0500 11/03/18 0430  Weight: 71.3 kg 70 kg 74.8 kg   Examination: Lying supine, resting no distress.  HENT NCAT no JVD Decreased breath sounds bilateral, right lower lobe crackles, no rhonchi Card regular irreg. afib on tele  abd not tender + bowel sounds Ext LE edema 1+ Neuro nonfocal  Chest x-ray 12/6 personally reviewed which shows reaccumulation of the right effusion minimal airspace disease in the left  Resolved Hospital Problem list   Small right pneumothorax -favor ex vacuo  Assessment & Plan:   Acute Hypoxic respiratory failure -attributed to acute on chronic diastolic heart failure Recurrent right effusion -Of note, pleural fluid had been removed in 06/16/2018 and 08/17/2018 with negative cytology.  Doubt infectious etiology and this is not chylothorax  Plan Cont lasix 80 tid Wean oxygen DNR/DNI  "COPD " -spirometry from 2018 reviewed, no evidence of significant airway obstruction, FVC was 70% Solumedrol added back per primary service, suspect that the wheezing reflects "cardiac asthma" and worsening overload not bronchospasm. Drop to 40 q 12 & would taper quickly    Afib, h/o CAD and acute on chronic diastolic HF Plan Lasix  apixiban   H/o lung cancer s/p RUL lobectomy, chemo and radiation. On observation therapy since June this year -cytology neg on Pleural fluid -No evidence of recurrence  of malignancy at this time   Kara Mead MD. [facility]. [place] Pager [phone] If no response call [phone]   11/03/2018    11/03/2018

## 2018-11-04 ENCOUNTER — Inpatient Hospital Stay (HOSPITAL_COMMUNITY): Payer: Medicare HMO

## 2018-11-04 DIAGNOSIS — D72829 Elevated white blood cell count, unspecified: Secondary | ICD-10-CM

## 2018-11-04 DIAGNOSIS — Z7189 Other specified counseling: Secondary | ICD-10-CM

## 2018-11-04 LAB — CULTURE, BLOOD (ROUTINE X 2)
Culture: NO GROWTH
Special Requests: ADEQUATE

## 2018-11-04 LAB — CBC
HCT: 36 % (ref 36.0–46.0)
HEMOGLOBIN: 11.4 g/dL — AB (ref 12.0–15.0)
MCH: 29.7 pg (ref 26.0–34.0)
MCHC: 31.7 g/dL (ref 30.0–36.0)
MCV: 93.8 fL (ref 80.0–100.0)
Platelets: 450 10*3/uL — ABNORMAL HIGH (ref 150–400)
RBC: 3.84 MIL/uL — ABNORMAL LOW (ref 3.87–5.11)
RDW: 13.4 % (ref 11.5–15.5)
WBC: 18.1 10*3/uL — ABNORMAL HIGH (ref 4.0–10.5)
nRBC: 0 % (ref 0.0–0.2)

## 2018-11-04 LAB — BASIC METABOLIC PANEL
Anion gap: 16 — ABNORMAL HIGH (ref 5–15)
BUN: 24 mg/dL — ABNORMAL HIGH (ref 8–23)
CO2: 28 mmol/L (ref 22–32)
Calcium: 8.5 mg/dL — ABNORMAL LOW (ref 8.9–10.3)
Chloride: 92 mmol/L — ABNORMAL LOW (ref 98–111)
Creatinine, Ser: 1.12 mg/dL — ABNORMAL HIGH (ref 0.44–1.00)
GFR calc non Af Amer: 46 mL/min — ABNORMAL LOW (ref >60)
GFR, EST AFRICAN AMERICAN: 53 mL/min — AB (ref >60)
Glucose, Bld: 229 mg/dL — ABNORMAL HIGH (ref 70–99)
Potassium: 3.8 mmol/L (ref 3.5–5.1)
Sodium: 136 mmol/L (ref 135–145)

## 2018-11-04 LAB — MAGNESIUM: Magnesium: 2 mg/dL (ref 1.7–2.4)

## 2018-11-04 MED ORDER — ZOLPIDEM TARTRATE 5 MG PO TABS
5.0000 mg | ORAL_TABLET | Freq: Once | ORAL | Status: AC
  Administered 2018-11-04: 5 mg via ORAL
  Filled 2018-11-04: qty 1

## 2018-11-04 MED ORDER — FUROSEMIDE 10 MG/ML IJ SOLN
80.0000 mg | Freq: Every day | INTRAMUSCULAR | Status: DC
  Administered 2018-11-05 – 2018-11-06 (×2): 80 mg via INTRAVENOUS
  Filled 2018-11-04 (×2): qty 8

## 2018-11-04 MED ORDER — MIRTAZAPINE 15 MG PO TABS: 15.0000 mg | ORAL_TABLET | Freq: Every day | ORAL | Status: DC

## 2018-11-04 MED ORDER — ZOLPIDEM TARTRATE 5 MG PO TABS
5.0000 mg | ORAL_TABLET | Freq: Every evening | ORAL | Status: DC | PRN
  Administered 2018-11-04 – 2018-11-11 (×8): 5 mg via ORAL
  Filled 2018-11-04 (×8): qty 1

## 2018-11-04 MED ORDER — SODIUM CHLORIDE 0.9 % IV SOLN
2.0000 g | Freq: Two times a day (BID) | INTRAVENOUS | Status: DC
  Filled 2018-11-04: qty 2

## 2018-11-04 MED ORDER — METHYLPREDNISOLONE SODIUM SUCC 40 MG IJ SOLR: 40.0000 mg | Freq: Every day | INTRAMUSCULAR | Status: DC

## 2018-11-04 NOTE — Progress Notes (Signed)
NAME:  Pamela Mcguire, MRN:  [id number], DOB:  June 29, 1935, LOS: 5 ADMISSION DATE:  10/30/2018, CONSULTATION DATE:  12/4 REFERRING MD:  [name], CHIEF COMPLAINT:  Acute hypoxic respiratory failure    Brief History   83 year old woman with stage IIIa adenocarcinoma status post right upper lobectomy and chemoradiation who had a recurrent right pleural effusion that was tapped twice in 05/2018 and again in 07/2018 with negative cytology.  She is also a colon cancer survivor and has a history of chronic atrial fibrillation and diastolic heart failure. She was admitted 12/3 with shortness of breath, fever and hypoxia, CT chest showing large right effusion,  Significant Hospital Events   12/4 admitted on 12/4 w/ working dx of large right effusion PCCM consulted for acute worsening of respiratory failure. Bed side thoracentesis completed. 2 liters of exudative appearing fluid obtained. 12/6 effusion worse again. Remains volume overloaded. Adjusting diuretics. Still awaiting pleural cholesterol. Treating as a mix of PNA and volume overload/edema   Consults:  Pulm   Procedures:  Right thoracentesis 2 liters cloudy orange appearing fluid removed. 12/4  Significant Diagnostic Tests:  CT chest and abd 12/4: neg for PE, mod to large right effusion, chronic stable subpleural reticulations, rounded opacity right heart border. CT abd: w/ sub centimeter left adrenal nodule   CT chest post thora 12/4 -small apical right pneumothorax, left lower lobe airspace disease/consolidation, moderate left effusion  Micro Data:  BCX 12/4> ng resp culture 12/4: >>>rare hemophilus Pleural fluid 12/4>>>ng Strep antigen 12/5: neg Legionella antigen 12/5>>>neg  Antimicrobials:  Cefepime 12/4>>> Metronidazole 12/4>>> 12/4 vanc 12/4>>> 12/4  Interim history/subjective:   Breathing is better. Her oxygen requirements continued to decrease     Objective   Blood pressure (!) 124/51, pulse 77, temperature 97.6 F (36.4  C), temperature source Oral, resp. rate (!) 27, height 5\' 6"  (1.676 m), weight 68.2 kg, SpO2 94 %.        Intake/Output Summary (Last 24 hours) at 11/04/2018 1255 Last data filed at 11/04/2018 0600 Gross per 24 hour  Intake 540 ml  Output 1200 ml  Net -660 ml   Filed Weights   11/02/18 0500 11/03/18 0430 11/04/18 0500  Weight: 70 kg 74.8 kg 68.2 kg   Examination: Lying supine, resting no distress.  HENT no pallor or icterus, no JVD Decreased breath sounds bilateral, right lower lobe crackles, no rhonchi Card regular irreg. afib on tele  abd not tender + bowel sounds Ext LE no edema Neuro nonfocal  Chest x-ray 12/8 personally reviewed mild right upper zone atelectasis/layering effusion  Resolved Hospital Problem list   Small right pneumothorax -favor ex vacuo  Assessment & Plan:   Acute Hypoxic respiratory failure -attributed to acute on chronic diastolic heart failure /some degree of right atelectasis, note that she has prior lobectomy Recurrent right effusion -Of note, pleural fluid removed in 06/16/2018 and 08/17/2018 with negative cytology.  Doubt infectious etiology and this is not chylothorax  Plan Wean oxygen DNR/DNI  "COPD " -spirometry from 2018 reviewed, no evidence of significant airway obstruction, FVC was 70% Solumedrol added back per primary service, suspect that the wheezing reflects "cardiac asthma" and worsening overload not bronchospasm. Drop to 40 q 24 and can discontinue Increased leukocytosis likely related to steroids, low procalcitonin is reassuring and no signs of infection, can discontinue cefepime   Afib, h/o CAD and acute on chronic diastolic HF Plan Decrease Lasix to 80 mg once daily, no creatinine rising apixiban   H/o lung cancer s/p RUL lobectomy,  chemo and radiation. On observation therapy since June this year -cytology neg on Pleural fluid -No evidence of recurrence of malignancy at this time  PT eval PCCM available as needed, can  transfer to telemetry  Kara Mead MD. FCCP. [facility] Pager [phone] If no response call [phone]   11/04/2018    11/04/2018

## 2018-11-04 NOTE — Progress Notes (Signed)
Daily Progress Note   Patient Name: Pamela Mcguire       Date: 11/04/2018 DOB: October 21, 1935  Age: 83 y.o. MRN#: [id number] Attending Physician: Aline August, MD Primary Care Physician: Hoyt Koch, MD Admit Date: 10/30/2018  Reason for Consultation/Follow-up: Establishing goals of care  Subjective: Pamela Mcguire is lying in bed on entering room. She reports that she is feeling better this morning, and her breathing has eased a little.  We discussed again about her clinical course over the past few days and she is hopeful that this is the beginning of her feeling better.  Length of Stay: 5  Current Medications: Scheduled Meds:  . apixaban  5 mg Oral BID  . atorvastatin  40 mg Oral q1800  . budesonide (PULMICORT) nebulizer solution  0.25 mg Nebulization BID  . digoxin  0.125 mg Oral Daily  . diltiazem  240 mg Oral Daily  . ferrous sulfate  325 mg Oral QPM  . fesoterodine  4 mg Oral Daily  . furosemide  80 mg Intravenous TID  . ipratropium-albuterol  3 mL Nebulization TID  . mouth rinse  15 mL Mouth Rinse BID  . methylPREDNISolone (SOLU-MEDROL) injection  40 mg Intravenous Q12H  . mirtazapine  15 mg Oral QHS  . multivitamin with minerals  1 tablet Oral Daily  . pantoprazole  40 mg Oral Daily  . sertraline  50 mg Oral Daily    Continuous Infusions: . ceFEPime (MAXIPIME) IV      PRN Meds: acetaminophen **OR** acetaminophen, guaiFENesin-dextromethorphan, haloperidol lactate, ipratropium-albuterol, morphine injection, ondansetron **OR** ondansetron (ZOFRAN) IV, sodium chloride flush  Physical Exam    General: Alert, awake, anxious, frail.  HEENT: No bruits, no goiter Heart: Rate controlled. No murmur appreciated. Lungs: Decereased air movement R>L, crackles scattered  throughout Abdomen: Soft, nontender, nondistended, positive bowel sounds.  Ext: No significant edema Skin: Warm and dry Neuro: Grossly intact, nonfocal.       Vital Signs: BP (!) 148/56   Pulse 95   Temp 97.6 F (36.4 C) (Oral)   Resp 20   Ht 5\' 6"  (1.676 m)   Wt 68.2 kg   LMP  (LMP Unknown)   SpO2 94%   BMI 24.27 kg/m  SpO2: SpO2: 94 % O2 Device: O2 Device: Nasal Cannula O2 Flow Rate: O2 Flow Rate (L/min): 4  L/min  Intake/output summary:   Intake/Output Summary (Last 24 hours) at 11/04/2018 1159 Last data filed at 11/04/2018 0600 Gross per 24 hour  Intake 540 ml  Output 1200 ml  Net -660 ml   LBM: Last BM Date: 11/02/18 Baseline Weight: Weight: 70.3 kg Most recent weight: Weight: 68.2 kg       Palliative Assessment/Data:    Flowsheet Rows     Most Recent Value  Intake Tab  Referral Department  Hospitalist  Unit at Time of Referral  ICU  Palliative Care Primary Diagnosis  Pulmonary  Date Notified  10/31/18  Palliative Care Type  New Palliative care  Reason for referral  Clarify Goals of Care  Date of Admission  10/30/18  Date first seen by Palliative Care  11/01/18  # of days Palliative referral response time  1 Day(s)  # of days IP prior to Palliative referral  1  Clinical Assessment  Pain Max last 24 hours  0  Pain Min Last 24 hours  0  Dyspnea Max Last 24 Hours  7  Dyspnea Min Last 24 hours  7  Psychosocial & Spiritual Assessment  Palliative Care Outcomes  Patient/Family meeting held?  Yes  Who was at the meeting?  patient  Palliative Care Outcomes  Clarified goals of care      Patient Active Problem List   Diagnosis Date Noted  . Acute respiratory failure with hypoxia ([place])   . Recurrent pleural effusion on right   . SIRS (systemic inflammatory response syndrome) ([place]) 10/30/2018  . Chronic respiratory failure with hypoxia ([place]), 2L home O2 08/29/2018  . SOB (shortness of breath) 08/29/2018  . Acute on chronic diastolic CHF (congestive heart  failure) ([place]) 08/29/2018  . COPD exacerbation ([place]) 08/26/2017  . Pleural effusion transudative 08/22/2017  . Medicare annual wellness visit, subsequent 07/10/2017  . Encounter for general adult medical examination with abnormal findings 07/10/2017  . Insomnia due to medical condition 04/18/2017  . Blurred vision 04/18/2017  . Cancer of ascending colon s/p lap colectomy 02/08/2017 02/08/2017  . Iron deficiency anemia   . Fundic gland polyps of stomach, benign   . Fracture of L1 vertebra ([place]) 10/19/2015  . Weakness   . Compression fracture 09/13/2015  . Mobility impaired   . Fatigue 08/18/2015  . Major depression 08/18/2015  . Frequent stools 08/18/2015  . Tachypnea 06/22/2015  . Adenocarcinoma of upper lobe of right lung 04/30/2015  . Mediastinal adenopathy 04/24/2015  . Severe obstructive sleep apnea 03/27/2015  . Lung mass 03/27/2015  . Folate deficiency 09/21/2014  . Paroxysmal a-fib ([place]) 05/27/2014  . Essential hypertension 05/27/2014  . Breast cancer ([place]) 04/17/2014  . Prediabetes 04/17/2014  . CAD (coronary artery disease) 04/16/2014  . Hyperlipidemia 04/16/2014    Palliative Care Assessment & Plan   Patient Profile: 83 y.o. female  with past medical history of diastolic CHF, a fib, HLP, sleep apnea, COPD, lung ca, recurrent pleural effusion admitted on 10/30/2018 with SOB and R sided pleural effusion.  S/p thoracentesis.  Palliative consulted for [place].   Recommendations/Plan: - DNR/DNI - Reports that she is "a little better" today.  Desires to continue current interventions and continue to reassess based on her clinical course over the 24-48 hours.    Code Status:    Code Status Orders  (From admission, onward)         Start     Ordered   10/31/18 1021  Do not attempt resuscitation (DNR)  Continuous  10/31/18 1020        Code Status History    Date Active Date Inactive Code Status Order ID Comments User Context   10/31/2018 0802 10/31/2018 1020 Full  Code [id number]  Aline August, MD Inpatient   10/31/2018 0019 10/31/2018 0802 Partial Code [id number]  Rise Patience, MD Inpatient   08/24/2018 2201 08/30/2018 1859 Partial Code [id number]  Merton Border, MD Inpatient   08/25/2017 0215 08/27/2017 1953 Full Code [id number]  Jani Gravel, MD ED   08/05/2017 1741 08/09/2017 1848 Full Code [id number]  Benito Mccreedy, MD Inpatient   02/08/2017 1239 02/15/2017 1935 Full Code [id number]  Leighton Ruff, MD Inpatient   09/13/2015 1819 09/17/2015 1747 Full Code [id number]  Kelvin Cellar, MD Inpatient   06/22/2015 0240 06/26/2015 1925 Full Code [id number]  Theressa Millard, MD Inpatient       Prognosis:  Guarded  Discharge Planning:  To Be Determined  Care plan was discussed with Dr. [name]  Thank you for allowing the Palliative Medicine Team to assist in the care of this patient.   Total Time 25 Prolonged Time Billed No      Greater than 50%  of this time was spent counseling and coordinating care related to the above assessment and plan.  Micheline Rough, MD  Please contact Palliative Medicine Team phone at [phone] for questions and concerns.

## 2018-11-04 NOTE — Progress Notes (Signed)
Patient ID: Pamela Mcguire, female   DOB: December 04, 1934, 83 y.o.   MRN: [id number]  PROGRESS NOTE    Attallah Ontko  FYB:[id number] DOB: 1934-12-29 DOA: 10/30/2018 PCP: Hoyt Koch, MD   Brief Narrative:  83 year old female with history of diastolic CHF, atrial fibrillation, hyperlipidemia, sleep apnea, COPD presented with worsening shortness of breath and chest pain.  She was found to be hypotensive, tachycardic and febrile in the ED.  CT scan of the chest and abdomen showed large right-sided pleural effusion.  She was started on broad-spectrum antibiotics and IV fluids.  Subsequently she became more hypoxic requiring BiPAP.  PCCM was consulted.  She had right-sided thoracentesis done on 10/31/2018.   Assessment & Plan:   Principal Problem:   SIRS (systemic inflammatory response syndrome) (HCC) Active Problems:   Paroxysmal a-fib (HCC)   Essential hypertension   Severe obstructive sleep apnea   Adenocarcinoma of upper lobe of right lung   Breast cancer (HCC)   CAD (coronary artery disease)   COPD exacerbation (HCC)   Chronic respiratory failure with hypoxia ([place]), 2L home O2   Recurrent pleural effusion on right   Acute respiratory failure with hypoxia (HCC)  Sepsis/SIRS most likely secondary to pneumonia: Present on admission -Continue cefepime.  Cultures negative so far.  Hemodynamically stable.  Acute hypoxic respiratory failure secondary to pneumonia/bilateral pleural effusion/pulmonary edema -Patient required BiPAP for little while on 10/31/2018.   -Currently still on 6 L oxygen via nasal cannula.  Repeat CXR from 11/02/18 showed worsening Right pleural effusion.  PCCM following -Patient's condition is not improving.  She does not feel well.  Overall prognosis is guarded to poor.  Palliative care following.  Consider hospice/comfort measures  Bilateral pleural effusion status post right-sided thoracentesis and subsequent right-sided small pneumothorax -Patient had right-sided  thoracentesis and 2 L fluid removed on 10/31/2018 and subsequently CT scan of the chest showed small right-sided pneumothorax and moderate left-sided pleural effusion along with consolidation on the left side -PCCM following.  Will follow recommendations.  Unclear if patient would benefit from left-sided thoracentesis as well or repeat Right thoracentesis. -continue Lasix  80 mg IV every 8 hours.  Negative balance of 3418 cc since admission  Community-acquired bacterial multilobar pneumonia -Continue cefepime to finish 5-7 day course of antibiotics.  Cultures negative so far.    Strep pneumoniae antigen is negative  Acute on chronic diastolic CHF -EF in September 2019 was 60 to 65%.  Continue Lasix  80 mg IV every 8 hours.  Strict input and output.  Daily weights.  Outpatient follow-up with cardiology  Chest pain with only minimally elevated troponin -Most likely secondary to above.  Management as above  COPD with mild exacerbation -Decrease Solumedrol tapering.  Continue nebs and Pulmicort next  Paroxysmal A. fib -Rate controlled.  Continue Eliquis.  Continue digoxin and Cardizem  Leukocytosis -Probably reactive from high-dose steroid use.  Repeat a.m. labs  Hypokalemia -Replaced  Hypomagnesemia -Replaced.   History of lung cancer, breast cancer and colon cancer in remission   DVT prophylaxis: Eliquis Code Status: DNR Family Communication: None at bedside Disposition Plan: Depends on clinical outcome  Consultants: PCCM/palliative care  Procedures: Right-sided thoracentesis on 10/31/2018 by PCCM  Antimicrobials: Cefepime from 10/30/2018 onwards Vancomycin 10/30/2018-10/31/2018   Subjective: Patient seen and examined at bedside.  Patient sleepy, wakes up only very slightly, goes back to sleep.  Does not feel well.  No overnight fever or vomiting reported. Objective: Vitals:   11/04/18 0600 11/04/18 0700 11/04/18 0800 11/04/18  0900  BP: (!) 134/55 131/69 (!) 120/44 (!)  154/57  Pulse: 70 77 72 74  Resp: 17 19 19  (!) 22  Temp:      TempSrc:      SpO2: 98% 99% 98% 100%  Weight:      Height:        Intake/Output Summary (Last 24 hours) at 11/04/2018 0924 Last data filed at 11/04/2018 0600 Gross per 24 hour  Intake 643.28 ml  Output 1650 ml  Net -1006.72 ml   Filed Weights   11/02/18 0500 11/03/18 0430 11/04/18 0500  Weight: 70 kg 74.8 kg 68.2 kg    Examination:  General exam: Appears ill looking but not in acute distress.  Sleepy, wakes up only slightly, does not feel well  Respiratory system: Bilateral decreased breath sounds at bases with scattered crackles Cardiovascular system: S1-S2 heard, rate controlled Gastrointestinal system: Abdomen is nondistended, soft and nontender. Normal bowel sounds heard. Extremities: No cyanosis; trace edema   Data Reviewed: I have personally reviewed following labs and imaging studies  CBC: Recent Labs  Lab 10/30/18 1556  10/31/18 0304 11/01/18 0500 11/02/18 0336 11/03/18 0325 11/04/18 0350  WBC 16.0*  --  14.2* 11.0* 10.9* 12.6* 18.1*  NEUTROABS 14.6*  --  12.2*  --   --   --   --   HGB 11.9*   < > 11.3* 10.1* 11.6* 11.8* 11.4*  HCT 36.3   < > 35.8* 31.7* 38.5 36.6 36.0  MCV 93.6  --  96.5 96.1 96.0 93.8 93.8  PLT 298  --  298 290 352 393 450*   < > = values in this interval not displayed.   Basic Metabolic Panel: Recent Labs  Lab 10/31/18 0304 10/31/18 1554 11/02/18 0336 11/03/18 0325 11/04/18 0350  NA 139 136 136 135 136  K 2.9* 3.0* 4.5 3.6 3.8  CL 103 96* 96* 94* 92*  CO2 28 29 27 28 28   GLUCOSE 147* 117* 113* 268* 229*  BUN 11 11 10 16  24*  CREATININE 0.77 0.92 0.85 0.88 1.12*  CALCIUM 6.9* 6.7* 7.6* 7.9* 8.5*  MG  --  1.1* 2.3 1.9 2.0   GFR: Estimated Creatinine Clearance: 36.9 mL/min (A) (by C-G formula based on SCr of 1.12 mg/dL (H)). Liver Function Tests: Recent Labs  Lab 10/30/18 1556 10/31/18 0304 10/31/18 1215  AST 23 17  --   ALT 15 14  --   ALKPHOS 54 56  --     BILITOT 1.3* 1.1  --   PROT 6.2* 5.7* 6.2*  ALBUMIN 3.1* 2.8*  --    Recent Labs  Lab 10/30/18 1556  LIPASE 28   No results for input(s): AMMONIA in the last 168 hours. Coagulation Profile: Recent Labs  Lab 10/31/18 0048  INR 1.67   Cardiac Enzymes: Recent Labs  Lab 10/31/18 0048 10/31/18 0606 10/31/18 1215  TROPONINI 0.04* 0.04* 0.05*   BNP (last 3 results) Recent Labs    09/12/18 1130  PROBNP 175.0*   HbA1C: No results for input(s): HGBA1C in the last 72 hours. CBG: No results for input(s): GLUCAP in the last 168 hours. Lipid Profile: No results for input(s): CHOL, HDL, LDLCALC, TRIG, CHOLHDL, LDLDIRECT in the last 72 hours. Thyroid Function Tests: No results for input(s): TSH, T4TOTAL, FREET4, T3FREE, THYROIDAB in the last 72 hours. Anemia Panel: No results for input(s): VITAMINB12, FOLATE, FERRITIN, TIBC, IRON, RETICCTPCT in the last 72 hours. Sepsis Labs: Recent Labs  Lab 10/30/18 1603 10/30/18 1814 10/31/18 7262  10/31/18 0057 10/31/18 0304 11/01/18 1547 11/02/18 0336 11/03/18 0325  PROCALCITON  --   --  0.10  --   --  <0.10 0.15 <0.10  LATICACIDVEN 4.05* 2.10*  --  1.6 1.4  --   --   --     Recent Results (from the past 240 hour(s))  Blood Culture (routine x 2)     Status: None (Preliminary result)   Collection Time: 10/30/18  4:08 PM  Result Value Ref Range Status   Specimen Description   Final    BLOOD RIGHT ARM Performed at [facility] [address], [place], [place] [address]    Special Requests   Final    BOTTLES DRAWN AEROBIC AND ANAEROBIC Blood Culture adequate volume Performed at [facility] [address] [place], [place] [address]    Culture   Final    NO GROWTH 4 DAYS Performed at [facility], [place] [address], [place], [place] [address]    Report Status PENDING  Incomplete  Urine culture     Status: Abnormal   Collection Time: 10/30/18  5:26 PM  Result Value Ref Range Status    Specimen Description   Final    URINE, RANDOM Performed at [facility] [address], [place], [place] [address]    Special Requests   Final    NONE Performed at [facility], [place] [address] [place] [address], [place], [place] [address]    Culture (A)  Final    <10,000 COLONIES/mL INSIGNIFICANT GROWTH Performed at [facility] [address] [place], [place] [address]    Report Status 11/01/2018 FINAL  Final  MRSA PCR Screening     Status: None   Collection Time: 10/30/18 11:55 PM  Result Value Ref Range Status   MRSA by PCR NEGATIVE NEGATIVE Final    Comment:        The GeneXpert MRSA Assay (FDA approved for NASAL specimens only), is one component of a comprehensive MRSA colonization surveillance program. It is not intended to diagnose MRSA infection nor to guide or monitor treatment for MRSA infections. Performed at [facility], [place] [address], [place], [place] [address]   Expectorated sputum assessment w rflx to resp cult     Status: None   Collection Time: 10/31/18  1:26 AM  Result Value Ref Range Status   Specimen Description SPUTUM  Final   Special Requests NONE  Final   Sputum evaluation   Final    THIS SPECIMEN IS ACCEPTABLE FOR SPUTUM CULTURE Performed at [facility], [place] [address], [place], [place] [address]    Report Status 10/31/2018 FINAL  Final  Culture, respiratory     Status: None   Collection Time: 10/31/18  1:26 AM  Result Value Ref Range Status   Specimen Description   Final    SPUTUM Performed at [facility] [address] [place], [place] [address]    Special Requests   Final    NONE Reflexed from [id number] Performed at [facility], [place] [address] [place], [place] [address]    Gram Stain   Final    ABUNDANT WBC PRESENT, PREDOMINANTLY PMN NO ORGANISMS SEEN    Culture   Final    RARE CANDIDA ALBICANS RARE HAEMOPHILUS  INFLUENZAE BETA LACTAMASE NEGATIVE Performed at [facility], [place] [address] [place], [place] [address]    Report Status 11/02/2018 FINAL  Final  Blood Culture (routine x 2)     Status: None (Preliminary result)   Collection Time: 10/31/18  3:04 AM  Result Value Ref Range Status   Specimen Description   Final    BLOOD RIGHT HAND Performed at [facility], [place] [address] [place], [place] [address]    Special Requests   Final    AEROBIC BOTTLE ONLY Blood Culture adequate volume Performed at [facility] [address], [place], [place] [address]    Culture   Final    NO GROWTH 3 DAYS Performed at [facility], [place] [address] [place], [place] [address]    Report Status PENDING  Incomplete  Body fluid culture     Status: None   Collection Time: 10/31/18 10:20 AM  Result Value Ref Range Status   Specimen Description   Final    PLEURAL RIGHT Performed at [facility] [address], [place], [place] [address]    Special Requests   Final    NONE Performed at [facility], [place] [address], [place], [place] [address]    Gram Stain   Final    WBC PRESENT, PREDOMINANTLY PMN NO ORGANISMS SEEN CYTOSPIN SMEAR    Culture   Final    NO GROWTH 3 DAYS Performed at [facility], [place] [address], [place], [place] [address]    Report Status 11/03/2018 FINAL  Final         Radiology Studies: Dg Chest Port 1 View  Result Date: 11/04/2018 CLINICAL DATA:  Acute respiratory failure. EXAM: PORTABLE CHEST 1 VIEW COMPARISON:  11/02/2018 FINDINGS: Patient is rotated to the left. Heart size is stable. Aortic atherosclerosis. Layering right pleural effusion shows no significant change in size. There's persistent atelectasis in the left upper lobe, however decreased atelectasis is noted in both lung bases. IMPRESSION: Stable layering right pleural effusion and right upper lobe atelectasis. Decreased bibasilar  atelectasis. Electronically Signed   By: Earle Gell M.D.   On: 11/04/2018 07:31        Scheduled Meds: . apixaban  5 mg Oral BID  . atorvastatin  40 mg Oral q1800  . budesonide (PULMICORT) nebulizer solution  0.25 mg Nebulization BID  . digoxin  0.125 mg Oral Daily  . diltiazem  240 mg Oral Daily  . ferrous sulfate  325 mg Oral QPM  . fesoterodine  4 mg Oral Daily  . furosemide  80 mg Intravenous TID  . ipratropium-albuterol  3 mL Nebulization TID  . mouth rinse  15 mL Mouth Rinse BID  . methylPREDNISolone (SOLU-MEDROL) injection  40 mg Intravenous Q12H  . mirtazapine  15 mg Oral QHS  . multivitamin with minerals  1 tablet Oral Daily  . pantoprazole  40 mg Oral Daily  . sertraline  50 mg Oral Daily   Continuous Infusions: . ceFEPime (MAXIPIME) IV       LOS: 5 days        Aline August, MD [facility] Pager [phone]  If 7PM-7AM, please contact night-coverage [email or web address] Password [facility] 11/04/2018, 9:24 AM

## 2018-11-04 NOTE — Progress Notes (Signed)
Pharmacy Antibiotic Note  Pamela Mcguire is a 83 y.o. female admitted on 10/30/2018 with sepsis.  Pharmacy has been consulted for cefepime dosing (vancomycin dc'd).  Day #5 full abx Afeb WBC up 18.1 (steroids) SCr up 1.12, CrCl ~35 ml/min PCT - PCT 0.1 <0.1 0.15  Plan: Decrease Cefepime 2gm IV q12h Monitor renal function and adjust if needed Follow up duration of therapy  Height: 5\' 6"  (167.6 cm) Weight: 150 lb 5.7 oz (68.2 kg) IBW/kg (Calculated) : 59.3  Temp (24hrs), Avg:97.7 F (36.5 C), Min:97.4 F (36.3 C), Max:98.3 F (36.8 C)  Recent Labs  Lab 10/30/18 1603 10/30/18 1814 10/31/18 0057 10/31/18 0304 10/31/18 1554 11/01/18 0500 11/02/18 0336 11/03/18 0325 11/04/18 0350  WBC  --   --   --  14.2*  --  11.0* 10.9* 12.6* 18.1*  CREATININE 0.90  --   --  0.77 0.92  --  0.85 0.88 1.12*  LATICACIDVEN 4.05* 2.10* 1.6 1.4  --   --   --   --   --     Estimated Creatinine Clearance: 36.9 mL/min (A) (by C-G formula based on SCr of 1.12 mg/dL (H)).    Allergies  Allergen Reactions  . Influenza Vaccines Anaphylaxis    Patient allergic to eggs  . Eggs Or Egg-Derived Products Nausea And Vomiting    Antimicrobials this admission:  12/3 Zosyn x1 12/3 Vancomycin >> 12/4 12/4 Cefepime  >> 124 Flagyl >> 12/4  Dose adjustments this admission:  12/8 decrease cefepime to 2g q12h for increased SCr  Microbiology results:  12/3 BCx: ngtd 12/3 UCx: <10k insignificant growth 12/4 Sputum: acceptable, rare C. Albicans, rare Haemophilus influenza BL negative 12/3 MRSA PCR: neg 12/4 Flu PCR: neg/neg 12/4 Pleural fluid: ngtd  Thank you for allowing pharmacy to be a part of this patient's care.  Peggyann Juba, PharmD, BCPS Pager: [phone] 11/04/2018 8:10 AM

## 2018-11-05 ENCOUNTER — Ambulatory Visit: Payer: Medicare HMO | Admitting: Internal Medicine

## 2018-11-05 LAB — CULTURE, BLOOD (ROUTINE X 2)
Culture: NO GROWTH
Special Requests: ADEQUATE

## 2018-11-05 LAB — BASIC METABOLIC PANEL
Anion gap: 11 (ref 5–15)
BUN: 29 mg/dL — ABNORMAL HIGH (ref 8–23)
CO2: 29 mmol/L (ref 22–32)
Calcium: 8.8 mg/dL — ABNORMAL LOW (ref 8.9–10.3)
Chloride: 93 mmol/L — ABNORMAL LOW (ref 98–111)
Creatinine, Ser: 0.96 mg/dL (ref 0.44–1.00)
GFR calc Af Amer: >60 mL/min (ref >60)
GFR calc non Af Amer: 55 mL/min — ABNORMAL LOW (ref >60)
Glucose, Bld: 316 mg/dL — ABNORMAL HIGH (ref 70–99)
Potassium: 4.1 mmol/L (ref 3.5–5.1)
Sodium: 133 mmol/L — ABNORMAL LOW (ref 135–145)

## 2018-11-05 LAB — CBC
HCT: 36.4 % (ref 36.0–46.0)
Hemoglobin: 11.9 g/dL — ABNORMAL LOW (ref 12.0–15.0)
MCH: 28.9 pg (ref 26.0–34.0)
MCHC: 32.7 g/dL (ref 30.0–36.0)
MCV: 88.3 fL (ref 80.0–100.0)
Platelets: 458 10*3/uL — ABNORMAL HIGH (ref 150–400)
RBC: 4.12 MIL/uL (ref 3.87–5.11)
RDW: 13.3 % (ref 11.5–15.5)
WBC: 14.8 10*3/uL — AB (ref 4.0–10.5)
nRBC: 0 % (ref 0.0–0.2)

## 2018-11-05 LAB — GLUCOSE, CAPILLARY
Glucose-Capillary: 267 mg/dL — ABNORMAL HIGH (ref 70–99)
Glucose-Capillary: 171 mg/dL — ABNORMAL HIGH (ref 70–99)

## 2018-11-05 LAB — MAGNESIUM: Magnesium: 1.9 mg/dL (ref 1.7–2.4)

## 2018-11-05 MED ORDER — INSULIN ASPART 100 UNIT/ML ~~LOC~~ SOLN
0.0000 [IU] | Freq: Every day | SUBCUTANEOUS | Status: DC
  Administered 2018-11-06 – 2018-11-11 (×3): 2 [IU] via SUBCUTANEOUS

## 2018-11-05 MED ORDER — PREDNISONE 20 MG PO TABS
40.0000 mg | ORAL_TABLET | Freq: Every day | ORAL | Status: DC
  Administered 2018-11-06: 40 mg via ORAL
  Filled 2018-11-05: qty 2

## 2018-11-05 MED ORDER — INSULIN ASPART 100 UNIT/ML ~~LOC~~ SOLN
0.0000 [IU] | Freq: Three times a day (TID) | SUBCUTANEOUS | Status: DC
  Administered 2018-11-06: 1 [IU] via SUBCUTANEOUS
  Administered 2018-11-06: 2 [IU] via SUBCUTANEOUS
  Administered 2018-11-06: 5 [IU] via SUBCUTANEOUS
  Administered 2018-11-07: 2 [IU] via SUBCUTANEOUS
  Administered 2018-11-07: 7 [IU] via SUBCUTANEOUS
  Administered 2018-11-07 – 2018-11-08 (×2): 3 [IU] via SUBCUTANEOUS
  Administered 2018-11-08: 5 [IU] via SUBCUTANEOUS
  Administered 2018-11-09: 2 [IU] via SUBCUTANEOUS
  Administered 2018-11-09: 3 [IU] via SUBCUTANEOUS
  Administered 2018-11-09: 1 [IU] via SUBCUTANEOUS
  Administered 2018-11-10 (×2): 2 [IU] via SUBCUTANEOUS
  Administered 2018-11-11: 3 [IU] via SUBCUTANEOUS
  Administered 2018-11-11 – 2018-11-12 (×2): 2 [IU] via SUBCUTANEOUS
  Administered 2018-11-12: 1 [IU] via SUBCUTANEOUS
  Administered 2018-11-12: 2 [IU] via SUBCUTANEOUS

## 2018-11-05 NOTE — Evaluation (Signed)
Physical Therapy Evaluation Patient Details Name: Pamela Mcguire MRN: [id number] DOB: [birth year] Today's Date: 11/05/2018   History of Present Illness  83 y.o. female with past medical history significant for right lung adenocarcinoma status post radiation and chemotherapy in the past and now on surveillance and no active therapy, history of breast cancer status post right mastectomy and colon cancer in remission, COPD, L1 compression fx, afib and pt was found to be hypotensive, tachycardic and febrile in the ED.  CT scan of the chest and abdomen showed large right-sided pleural effusion  Clinical Impression  Pt admitted with above diagnosis. Pt currently with functional limitations due to the deficits listed below (see PT Problem List).  Pt will benefit from skilled PT to increase their independence and safety with mobility to allow discharge to the venue listed below.  Pt reports SOB with minimal exertion however agreeable for assist OOB to recliner today.  Pt is from ILF and typically uses 2L O2 [place] and rollator for mobility at baseline.  Pt reports possible d/c to hospice however did not have a clear understanding of this and asking questions which were deferred to MD.  Pt will likely require more assist upon d/c.     Follow Up Recommendations SNF    Equipment Recommendations  None recommended by PT    Recommendations for Other Services       Precautions / Restrictions Precautions Precautions: Fall Precaution Comments: chronic oxygen (2L O2 baseline) Restrictions Weight Bearing Restrictions: No      Mobility  Bed Mobility Overal bed mobility: Needs Assistance Bed Mobility: Supine to Sit     Supine to sit: Min assist;HOB elevated     General bed mobility comments: assist for trunk upright  Transfers Overall transfer level: Needs assistance Equipment used: None Transfers: Sit to/from [facility] Sit to Stand: Min assist Stand pivot transfers: Min assist        General transfer comment: pt declined RW, requires UE support however so pt self assisted holding PTs arm and armrest on recliner, min assist for steadying, remained on 4L O2 , Spo2 92%  Ambulation/Gait             General Gait Details: pt declined at this time, felt too weak and SOB  Stairs            Wheelchair Mobility    Modified Rankin (Stroke Patients Only)       Balance Overall balance assessment: Needs assistance         Standing balance support: Bilateral upper extremity supported Standing balance-Leahy Scale: Poor                               Pertinent Vitals/Pain Pain Assessment: No/denies pain(no specific pain, just reports not feeling well)    Home Living       Type of Home: Independent living facility         Home Equipment: Walker - 4 wheels Additional Comments: chronic 2L O2    Prior Function Level of Independence: Needs assistance   Gait / Transfers Assistance Needed: uses rollator, able to ambulate to dining room  ADL's / Homemaking Assistance Needed: pt reports she can perform her own ADLs, ILF only assists with meds        Hand Dominance        Extremity/Trunk Assessment   Upper Extremity Assessment Upper Extremity Assessment: Generalized weakness    Lower Extremity Assessment Lower  Extremity Assessment: Generalized weakness       Communication   Communication: HOH  Cognition Arousal/Alertness: Awake/alert Behavior During Therapy: WFL for tasks assessed/performed Overall Cognitive Status: Within Functional Limits for tasks assessed                                        General Comments      Exercises     Assessment/Plan    PT Assessment Patient needs continued PT services  PT Problem List Decreased strength;Decreased mobility;Decreased activity tolerance;Decreased knowledge of use of DME;Cardiopulmonary status limiting activity       PT Treatment Interventions DME  instruction;Therapeutic activities;Gait training;Therapeutic exercise;Patient/family education;Balance training;Functional mobility training;Wheelchair mobility training    PT Goals (Current goals can be found in the Care Plan section)  Acute Rehab PT Goals PT Goal Formulation: With patient Time For Goal Achievement: 11/19/18 Potential to Achieve Goals: Good    Frequency Min 2X/week   Barriers to discharge        Co-evaluation               AM-PAC PT "6 Clicks" Mobility  Outcome Measure Help needed turning from your back to your side while in a flat bed without using bedrails?: A Little Help needed moving from lying on your back to sitting on the side of a flat bed without using bedrails?: A Lot Help needed moving to and from a bed to a chair (including a wheelchair)?: A Little Help needed standing up from a chair using your arms (e.g., wheelchair or bedside chair)?: A Little Help needed to walk in hospital room?: A Lot Help needed climbing 3-5 steps with a railing? : Total 6 Click Score: 14    End of Session Equipment Utilized During Treatment: Gait belt;Oxygen Activity Tolerance: Patient limited by fatigue Patient left: in chair;with chair alarm set;with call bell/phone within reach   PT Visit Diagnosis: Difficulty in walking, not elsewhere classified (R26.2)    Time: 7124-5809 PT Time Calculation (min) (ACUTE ONLY): 12 min   Charges:   PT Evaluation $PT Eval Low Complexity: [place], PT, DPT Acute Rehabilitation Services Office: [phone] Pager: [phone]  York Ram E 11/05/2018, 12:12 PM

## 2018-11-05 NOTE — Clinical Social Work Note (Signed)
Clinical Social Work Assessment  Patient Details  Name: Pamela Mcguire MRN: [id number] Date of Birth: 01/11/1935  Date of referral:  11/05/18               Reason for consult:  (facility resident)                Permission sought to share information with:  Family Supports Permission granted to share information::  Yes, Verbal Permission Granted  Name::     daughters [name] and [name]  Agency::     Relationship::     Contact Information:     Housing/Transportation Living arrangements for the past 2 months:  Charity fundraiser of Information:  Patient Patient Interpreter Needed:  None Criminal Activity/Legal Involvement Pertinent to Current Situation/Hospitalization:  No - Comment as needed Significant Relationships:  Adult Children, Community Support Lives with:  Self, Facility Resident Do you feel safe going back to the place where you live?  Yes Need for family participation in patient care:  Yes (Comment)(states daughter assist)  Care giving concerns:  Pt admitted from independent living facility where she resides alone in apartment. Daughters live nearby and assist her with care decisions.  Admitted with pneumonia/bilateral pleural effusion/pulmonary edema. Uses O2 at home at baseline.   Social Worker assessment / plan:  CSW consulted to assist with disposition (consulted as pt reported living at [facility], however upon assessment CSW clarified she lives at independent living there rather than ALF). Pt reports she has been told her prognosis is poor and she is considering hospice at this point "if I don't get better."  CSW informed her will assist with coordinating care when decisions are made.   Employment status:  Retired Water engineer) PT Recommendations:  Not assessed at this time Information / Referral to community resources:     Patient/Family's Response to care:  Pt very appreciative  Patient/Family's  Understanding of and Emotional Response to Diagnosis, Current Treatment, and Prognosis:  Pt shoes adequate understanding of her treatment and of the discussions she has had with providers re her prognosis. Emotionally seems very accepting and was calm and open to all discussions. States she feels well-supported with her family to make decisions.   Emotional Assessment Appearance:  Appears stated age Attitude/Demeanor/Rapport:  Engaged Affect (typically observed):  Accepting, Calm Orientation:  Oriented to Self, Oriented to Place, Oriented to  Time, Oriented to Situation Alcohol / Substance use:  Not Applicable Psych involvement (Current and /or in the community):  No (Comment)  Discharge Needs  Concerns to be addressed:  Decision making concerns, Care Coordination Readmission within the last 30 days:  No Current discharge risk:  Chronically ill Barriers to Discharge:  Continued Medical Work up   [employer], LCSW 11/05/2018, 11:06 AM [phone]

## 2018-11-05 NOTE — Care Management Important Message (Signed)
Important Message  Patient Details  Name: Pamela Mcguire MRN: [id number] Date of Birth: [birth year]   Medicare Important Message Given:  Yes    Kerin Salen 11/05/2018, 12:16 [place] Message  Patient Details  Name: Pamela Mcguire MRN: [id number] Date of Birth: 1935-11-16   Medicare Important Message Given:  Yes    Kerin Salen 11/05/2018, 12:16 PM

## 2018-11-05 NOTE — Progress Notes (Signed)
Patient ID: Pamela Mcguire, female   DOB: 12-Jul-1935, 83 y.o.   MRN: [id number]  PROGRESS NOTE    Pamela Mcguire  CHE:[id number] DOB: 07-02-1935 DOA: 10/30/2018 PCP: Hoyt Koch, MD   Brief Narrative:  83 year old female with history of diastolic CHF, atrial fibrillation, hyperlipidemia, sleep apnea, COPD presented with worsening shortness of breath and chest pain.  She was found to be hypotensive, tachycardic and febrile in the ED.  CT scan of the chest and abdomen showed large right-sided pleural effusion.  She was started on broad-spectrum antibiotics and IV fluids.  Subsequently she became more hypoxic requiring BiPAP.  PCCM was consulted.  She had right-sided thoracentesis done on 10/31/2018.   Assessment & Plan:   Principal Problem:   SIRS (systemic inflammatory response syndrome) (HCC) Active Problems:   Paroxysmal a-fib (HCC)   Essential hypertension   Severe obstructive sleep apnea   Adenocarcinoma of upper lobe of right lung   Breast cancer (HCC)   CAD (coronary artery disease)   COPD exacerbation (HCC)   Chronic respiratory failure with hypoxia (HCC), 2L home O2   Pleural effusion on right   Acute respiratory failure with hypoxia (HCC)  Sepsis/SIRS most likely secondary to pneumonia: Present on admission -Cultures negative so far.  Hemodynamically stable. -Sepsis is resolved  Acute hypoxic respiratory failure secondary to pneumonia/bilateral pleural effusion/pulmonary edema -Patient required BiPAP for little while on 10/31/2018.   -Currently still on 5 L oxygen via nasal cannula.  Repeat CXR from 11/02/18 showed worsening Right pleural effusion.  PCCM has signed off -Patient feels slightly better.  Overall prognosis is guarded to poor as she might have recurrence of pleural effusion.  If condition does not improve, consider hospice/comfort measures.  Palliative care following.  Bilateral pleural effusion status post right-sided thoracentesis and subsequent right-sided  small pneumothorax -Patient had right-sided thoracentesis and 2 L fluid removed on 10/31/2018 and subsequently CT scan of the chest showed small right-sided pneumothorax and moderate left-sided pleural effusion along with consolidation on the left side -PCCM was following and has signed off.  Lasix dose was decreased by PCCM on 11/04/2018.  Negative balance of 4168 cc since admission -Repeat chest x-ray in a.m. including decubitus films  Community-acquired bacterial multilobar pneumonia -Cefepime was discontinued on 11/04/2018 by PCCM.  Cultures negative so far.    Strep pneumoniae antigen is negative  Acute on chronic diastolic CHF -EF in September 2019 was 60 to 65%.  Continue Lasix  80 mg IV daily for now.  Strict input and output.  Daily weights.  Outpatient follow-up with cardiology  Chest pain with only minimally elevated troponin -Most likely secondary to above.  Management as above  COPD with mild exacerbation -Continue steroid tapering.  Will switch to oral prednisone 40 mg daily from tomorrow.  Continue nebs and Pulmicort next  Paroxysmal A. fib -Rate controlled.  Continue Eliquis.  Continue digoxin and Cardizem  Leukocytosis -Probably reactive from high-dose steroid use.  Improving.  Repeat a.m. labs  Hypokalemia -Replaced  Hypomagnesemia -Replaced.   History of lung cancer, breast cancer and colon cancer in remission   DVT prophylaxis: Eliquis Code Status: DNR Family Communication: None at bedside Disposition Plan: Depends on clinical outcome  Consultants: PCCM/palliative care  Procedures: Right-sided thoracentesis on 10/31/2018 by PCCM  Antimicrobials: Cefepime from 10/30/2018-11/04/2018 vancomycin 10/30/2018-10/31/2018   Subjective: Patient seen and examined at bedside.  Patient is more awake this morning and feels slightly better.  Her shortness of breath is slightly better but is still intermittently very  short of breath with even minimal exertion.  No overnight  fever or vomiting.  Objective: Vitals:   11/04/18 2103 11/05/18 0506 11/05/18 0508 11/05/18 0815  BP:  139/66    Pulse:  67  77  Resp:  20  18  Temp:  97.7 F (36.5 C)    TempSrc:  Oral    SpO2: 94% 98%  95%  Weight:   64 kg   Height:        Intake/Output Summary (Last 24 hours) at 11/05/2018 1013 Last data filed at 11/05/2018 0600 Gross per 24 hour  Intake 400 ml  Output 1150 ml  Net -750 ml   Filed Weights   11/04/18 0500 11/04/18 1641 11/05/18 0508  Weight: 68.2 kg 68.6 kg 64 kg    Examination:  General exam: Appears chronically ill looking but not in acute distress.  She is more awake this morning  respiratory system: Bilateral decreased breath sounds at bases with scattered crackles, no wheezing Cardiovascular system: Rate controlled, S1-S2 heard Gastrointestinal system: Abdomen is nondistended, soft and nontender. Normal bowel sounds heard. Extremities: No cyanosis; trace edema   Data Reviewed: I have personally reviewed following labs and imaging studies  CBC: Recent Labs  Lab 10/30/18 1556  10/31/18 0304 11/01/18 0500 11/02/18 0336 11/03/18 0325 11/04/18 0350 11/05/18 0454  WBC 16.0*  --  14.2* 11.0* 10.9* 12.6* 18.1* 14.8*  NEUTROABS 14.6*  --  12.2*  --   --   --   --   --   HGB 11.9*   < > 11.3* 10.1* 11.6* 11.8* 11.4* 11.9*  HCT 36.3   < > 35.8* 31.7* 38.5 36.6 36.0 36.4  MCV 93.6  --  96.5 96.1 96.0 93.8 93.8 88.3  PLT 298  --  298 290 352 393 450* 458*   < > = values in this interval not displayed.   Basic Metabolic Panel: Recent Labs  Lab 10/31/18 1554 11/02/18 0336 11/03/18 0325 11/04/18 0350 11/05/18 0454  NA 136 136 135 136 133*  K 3.0* 4.5 3.6 3.8 4.1  CL 96* 96* 94* 92* 93*  CO2 29 27 28 28 29   GLUCOSE 117* 113* 268* 229* 316*  BUN 11 10 16  24* 29*  CREATININE 0.92 0.85 0.88 1.12* 0.96  CALCIUM 6.7* 7.6* 7.9* 8.5* 8.8*  MG 1.1* 2.3 1.9 2.0 1.9   GFR: Estimated Creatinine Clearance: 43 mL/min (by C-G formula based on SCr of  0.96 mg/dL). Liver Function Tests: Recent Labs  Lab 10/30/18 1556 10/31/18 0304 10/31/18 1215  AST 23 17  --   ALT 15 14  --   ALKPHOS 54 56  --   BILITOT 1.3* 1.1  --   PROT 6.2* 5.7* 6.2*  ALBUMIN 3.1* 2.8*  --    Recent Labs  Lab 10/30/18 1556  LIPASE 28   No results for input(s): AMMONIA in the last 168 hours. Coagulation Profile: Recent Labs  Lab 10/31/18 0048  INR 1.67   Cardiac Enzymes: Recent Labs  Lab 10/31/18 0048 10/31/18 0606 10/31/18 1215  TROPONINI 0.04* 0.04* 0.05*   BNP (last 3 results) Recent Labs    09/12/18 1130  PROBNP 175.0*   HbA1C: No results for input(s): HGBA1C in the last 72 hours. CBG: Recent Labs  Lab 11/05/18 0642  GLUCAP 267*   Lipid Profile: No results for input(s): CHOL, HDL, LDLCALC, TRIG, CHOLHDL, LDLDIRECT in the last 72 hours. Thyroid Function Tests: No results for input(s): TSH, T4TOTAL, FREET4, T3FREE, THYROIDAB in the last  72 hours. Anemia Panel: No results for input(s): VITAMINB12, FOLATE, FERRITIN, TIBC, IRON, RETICCTPCT in the last 72 hours. Sepsis Labs: Recent Labs  Lab 10/30/18 1603 10/30/18 1814 10/31/18 0048 10/31/18 0057 10/31/18 0304 11/01/18 1547 11/02/18 0336 11/03/18 0325  PROCALCITON  --   --  0.10  --   --  <0.10 0.15 <0.10  LATICACIDVEN 4.05* 2.10*  --  1.6 1.4  --   --   --     Recent Results (from the past 240 hour(s))  Blood Culture (routine x 2)     Status: None   Collection Time: 10/30/18  4:08 PM  Result Value Ref Range Status   Specimen Description   Final    BLOOD RIGHT ARM Performed at [facility], [place] [address] [place], [place] [address]    Special Requests   Final    BOTTLES DRAWN AEROBIC AND ANAEROBIC Blood Culture adequate volume Performed at [facility] [address], [place], [place] [address]    Culture   Final    NO GROWTH 5 DAYS Performed at [facility], [place] [address], [place], [place] [address]    Report  Status 11/04/2018 FINAL  Final  Urine culture     Status: Abnormal   Collection Time: 10/30/18  5:26 PM  Result Value Ref Range Status   Specimen Description   Final    URINE, RANDOM Performed at [facility] [address] [place], [place] [address]    Special Requests   Final    NONE Performed at [facility], [place] [address], [place], [place] [address]    Culture (A)  Final    <10,000 COLONIES/mL INSIGNIFICANT GROWTH Performed at [facility] [address] [place], [place] [address]    Report Status 11/01/2018 FINAL  Final  MRSA PCR Screening     Status: None   Collection Time: 10/30/18 11:55 PM  Result Value Ref Range Status   MRSA by PCR NEGATIVE NEGATIVE Final    Comment:        The GeneXpert MRSA Assay (FDA approved for NASAL specimens only), is one component of a comprehensive MRSA colonization surveillance program. It is not intended to diagnose MRSA infection nor to guide or monitor treatment for MRSA infections. Performed at [facility], [place] [address], [place], [place] [address]   Expectorated sputum assessment w rflx to resp cult     Status: None   Collection Time: 10/31/18  1:26 AM  Result Value Ref Range Status   Specimen Description SPUTUM  Final   Special Requests NONE  Final   Sputum evaluation   Final    THIS SPECIMEN IS ACCEPTABLE FOR SPUTUM CULTURE Performed at [facility], [place] [address] [place], [place] [address]    Report Status 10/31/2018 FINAL  Final  Culture, respiratory     Status: None   Collection Time: 10/31/18  1:26 AM  Result Value Ref Range Status   Specimen Description   Final    SPUTUM Performed at [facility] [address], [place],  [address]    Special Requests   Final    NONE Reflexed from [id number] Performed at [facility], [place] [address], [place], [place] [address]    Gram Stain   Final     ABUNDANT WBC PRESENT, PREDOMINANTLY PMN NO ORGANISMS SEEN    Culture   Final    RARE CANDIDA  ALBICANS RARE HAEMOPHILUS INFLUENZAE BETA LACTAMASE NEGATIVE Performed at [facility], [place] [address], [place], [place] [address]    Report Status 11/02/2018 FINAL  Final  Blood Culture (routine x 2)     Status: None (Preliminary result)   Collection Time: 10/31/18  3:04 AM  Result Value Ref Range Status   Specimen Description   Final    BLOOD RIGHT HAND Performed at [facility], [place] [address], [place], [place] [address]    Special Requests   Final    AEROBIC BOTTLE ONLY Blood Culture adequate volume Performed at [facility] [address], [place], [place] [address]    Culture   Final    NO GROWTH 4 DAYS Performed at [facility], [place] [address], [place], [place] [address]    Report Status PENDING  Incomplete  Body fluid culture     Status: None   Collection Time: 10/31/18 10:20 AM  Result Value Ref Range Status   Specimen Description   Final    PLEURAL RIGHT Performed at [place] [address] [place], [place] [address]    Special Requests   Final    NONE Performed at [facility], [place] [address] [place], [place] [address]    Gram Stain   Final    WBC PRESENT, PREDOMINANTLY PMN NO ORGANISMS SEEN CYTOSPIN SMEAR    Culture   Final    NO GROWTH 3 DAYS Performed at [facility], [place] [address] [place], [place] [address]    Report Status 11/03/2018 FINAL  Final         Radiology Studies: Dg Chest Port 1 View  Result Date: 11/04/2018 CLINICAL DATA:  Acute respiratory failure. EXAM: PORTABLE CHEST 1 VIEW COMPARISON:  11/02/2018 FINDINGS: Patient is rotated to the left. Heart size is stable. Aortic atherosclerosis. Layering right pleural effusion shows no significant change in size. There's persistent atelectasis in the left upper lobe, however decreased atelectasis is noted in  both lung bases. IMPRESSION: Stable layering right pleural effusion and right upper lobe atelectasis. Decreased bibasilar atelectasis. Electronically Signed   By: Earle Gell M.D.   On: 11/04/2018 07:31        Scheduled Meds: . apixaban  5 mg Oral BID  . atorvastatin  40 mg Oral q1800  . budesonide (PULMICORT) nebulizer solution  0.25 mg Nebulization BID  . digoxin  0.125 mg Oral Daily  . diltiazem  240 mg Oral Daily  . ferrous sulfate  325 mg Oral QPM  . fesoterodine  4 mg Oral Daily  . furosemide  80 mg Intravenous Daily  . ipratropium-albuterol  3 mL Nebulization TID  . mouth rinse  15 mL Mouth Rinse BID  . methylPREDNISolone (SOLU-MEDROL) injection  40 mg Intravenous Daily  . mirtazapine  15 mg Oral QHS  . multivitamin with minerals  1 tablet Oral Daily  . pantoprazole  40 mg Oral Daily  . sertraline  50 mg Oral Daily   Continuous Infusions:    LOS: 6 days        Aline August, MD [facility] Pager [phone]  If 7PM-7AM, please contact night-coverage [email or web address] Password [email or web address] 11/05/2018, 10:13 AM

## 2018-11-05 NOTE — Progress Notes (Signed)
Inpatient Diabetes Program Recommendations  AACE/ADA: New Consensus Statement on Inpatient Glycemic Control (2015)  Target Ranges:  Prepandial:   less than 140 mg/dL      Peak postprandial:   less than 180 mg/dL (1-2 hours)      Critically ill patients:  140 - 180 mg/dL   Results for CHALYN, AMESCUA (MRN [id number]) as of 11/05/2018 15:05  Ref. Range 11/05/2018 06:42  Glucose-Capillary Latest Ref Range: 70 - 99 mg/dL 267 (H)   Results for MALAYSHIA, ALL (MRN [id number]) as of 11/05/2018 15:05  Ref. Range 11/05/2018 04:54  Glucose Latest Ref Range: 70 - 99 mg/dL 316 (H)    Admit with: SIRS/ Sepsis/ Pneumonia  History: CHF, COPD       Lab glucose severely elevated this AM (316 mg/dl).  Solumedrol stopped--last dose given yesterday at 9am.  Now getting Prednisone 40 mg Daily.    MD- Please consider placing orders for CBG checks and Novolog Sensitive SSI (0-9 units) TID AC + HS     --Will follow patient during hospitalization--  Wyn Quaker RN, MSN, CDE Diabetes Coordinator Inpatient Glycemic Control Team Team Pager: [phone] (8a-5p)

## 2018-11-06 ENCOUNTER — Inpatient Hospital Stay (HOSPITAL_COMMUNITY): Payer: Medicare HMO

## 2018-11-06 ENCOUNTER — Ambulatory Visit: Payer: Medicare HMO | Admitting: Internal Medicine

## 2018-11-06 DIAGNOSIS — R042 Hemoptysis: Secondary | ICD-10-CM

## 2018-11-06 DIAGNOSIS — B963 Hemophilus influenzae [H. influenzae] as the cause of diseases classified elsewhere: Secondary | ICD-10-CM

## 2018-11-06 LAB — BASIC METABOLIC PANEL
ANION GAP: 9 (ref 5–15)
BUN: 28 mg/dL — ABNORMAL HIGH (ref 8–23)
CALCIUM: 9.1 mg/dL (ref 8.9–10.3)
CO2: 34 mmol/L — ABNORMAL HIGH (ref 22–32)
Chloride: 94 mmol/L — ABNORMAL LOW (ref 98–111)
Creatinine, Ser: 0.95 mg/dL (ref 0.44–1.00)
GFR calc Af Amer: >60 mL/min (ref >60)
GFR calc non Af Amer: 56 mL/min — ABNORMAL LOW (ref >60)
Glucose, Bld: 155 mg/dL — ABNORMAL HIGH (ref 70–99)
Potassium: 3.6 mmol/L (ref 3.5–5.1)
Sodium: 137 mmol/L (ref 135–145)

## 2018-11-06 LAB — CBC
HCT: 37.7 % (ref 36.0–46.0)
Hemoglobin: 12 g/dL (ref 12.0–15.0)
MCH: 29.9 pg (ref 26.0–34.0)
MCHC: 31.8 g/dL (ref 30.0–36.0)
MCV: 94 fL (ref 80.0–100.0)
Platelets: 434 10*3/uL — ABNORMAL HIGH (ref 150–400)
RBC: 4.01 MIL/uL (ref 3.87–5.11)
RDW: 13.5 % (ref 11.5–15.5)
WBC: 12.5 10*3/uL — ABNORMAL HIGH (ref 4.0–10.5)
nRBC: 0 % (ref 0.0–0.2)

## 2018-11-06 LAB — HEMOGLOBIN A1C
Hgb A1c MFr Bld: 6.8 % — ABNORMAL HIGH (ref 4.8–5.6)
MEAN PLASMA GLUCOSE: 148.46 mg/dL

## 2018-11-06 LAB — GLUCOSE, CAPILLARY
Glucose-Capillary: 144 mg/dL — ABNORMAL HIGH (ref 70–99)
Glucose-Capillary: 160 mg/dL — ABNORMAL HIGH (ref 70–99)
Glucose-Capillary: 274 mg/dL — ABNORMAL HIGH (ref 70–99)
Glucose-Capillary: 246 mg/dL — ABNORMAL HIGH (ref 70–99)

## 2018-11-06 LAB — MAGNESIUM: Magnesium: 2 mg/dL (ref 1.7–2.4)

## 2018-11-06 MED ORDER — PREDNISONE 20 MG PO TABS
30.0000 mg | ORAL_TABLET | Freq: Every day | ORAL | Status: DC
  Administered 2018-11-07: 30 mg via ORAL
  Filled 2018-11-06: qty 1

## 2018-11-06 MED ORDER — FUROSEMIDE 10 MG/ML IJ SOLN
80.0000 mg | Freq: Two times a day (BID) | INTRAMUSCULAR | Status: DC
  Administered 2018-11-06 – 2018-11-07 (×2): 80 mg via INTRAVENOUS
  Filled 2018-11-06 (×2): qty 8

## 2018-11-06 NOTE — Progress Notes (Signed)
Patient ID: Pamela Mcguire, female   DOB: November 18, 1935, 83 y.o.   MRN: [id number]  PROGRESS NOTE    Larysa Pall  OEU:[id number] DOB: 1935/09/20 DOA: 10/30/2018 PCP: Hoyt Koch, MD   Brief Narrative:  83 year old female with history of diastolic CHF, atrial fibrillation, hyperlipidemia, sleep apnea, COPD presented with worsening shortness of breath and chest pain.  She was found to be hypotensive, tachycardic and febrile in the ED.  CT scan of the chest and abdomen showed large right-sided pleural effusion.  She was started on broad-spectrum antibiotics and IV fluids.  Subsequently she became more hypoxic requiring BiPAP.  PCCM was consulted.  She had right-sided thoracentesis done on 10/31/2018.   Assessment & Plan:   Principal Problem:   SIRS (systemic inflammatory response syndrome) (HCC) Active Problems:   Paroxysmal a-fib (HCC)   Essential hypertension   Severe obstructive sleep apnea   Adenocarcinoma of upper lobe of right lung   Breast cancer (HCC)   CAD (coronary artery disease)   COPD exacerbation (HCC)   Chronic respiratory failure with hypoxia (HCC), 2L home O2   Pleural effusion on right   Acute respiratory failure with hypoxia (HCC)  Sepsis/SIRS most likely secondary to pneumonia: Present on admission -Cultures negative so far.  Hemodynamically stable. -Sepsis is resolved  Acute hypoxic respiratory failure secondary to pneumonia/bilateral pleural effusion/pulmonary edema -Patient required BiPAP for little while on 10/31/2018.   -Respiratory status improving.  Currently on 2 L oxygen via nasal cannula.  Repeat CXR from 11/02/18 showed worsening Right pleural effusion.  PCCM has signed off -Repeat chest x-ray from today including decubitus films shows moderate right-sided pleural effusion.  Will contact PCCM again regarding further plan of care.  Might need Pleurx catheter at some point even for palliative reasons. -Patient feels slightly better.  Overall prognosis is  guarded to poor.  If condition does not improve, consider hospice/comfort measures.  Palliative care following.  Bilateral pleural effusion status post right-sided thoracentesis and subsequent right-sided small pneumothorax -Patient had right-sided thoracentesis and 2 L fluid removed on 10/31/2018 and subsequently CT scan of the chest showed small right-sided pneumothorax and moderate left-sided pleural effusion along with consolidation on the left side -PCCM was following and has signed off.  Lasix dose was decreased by PCCM on 11/04/2018.  Negative balance of 5848 cc since admission -Chest x-ray from today as above. -Increase Lasix to 80 mg twice a day.  Community-acquired bacterial multilobar pneumonia -Cefepime was discontinued on 11/04/2018 by PCCM.  Cultures negative so far.    Strep pneumoniae antigen is negative  Acute on chronic diastolic CHF -EF in September 2019 was 60 to 65%.  Continue Lasix  80 mg IV daily for now.  Strict input and output.  Daily weights.  Outpatient follow-up with cardiology  Chest pain with only minimally elevated troponin -Most likely secondary to above.  Management as above  COPD with mild exacerbation -Continue steroid tapering.  Prednisone started today.  Tapered off quickly in the next few days.  Continue nebs and Pulmicort next  Paroxysmal A. fib -Rate controlled.  Continue Eliquis.  Continue digoxin and Cardizem  Leukocytosis -Probably reactive from high-dose steroid use.  Improving.  Repeat a.m. labs  Hypokalemia -Replaced  Hypomagnesemia -Replaced.   History of lung cancer, breast cancer and colon cancer in remission  Generalized deconditioning -PT recommends SNF placement.  Social worker consulted   DVT prophylaxis: Eliquis Code Status: DNR Family Communication: None at bedside Disposition Plan: Depends on clinical outcome  Consultants: PCCM/palliative  care  Procedures: Right-sided thoracentesis on 10/31/2018 by  PCCM  Antimicrobials: Cefepime from 10/30/2018-11/04/2018  vancomycin 10/30/2018-10/31/2018   Subjective: Patient seen and examined at bedside.  Patient feels better but still feels very tired.  Short of breath with minimal exertion.  No overnight fever, nausea, vomiting or chest pains.   Objective: Vitals:   11/05/18 2132 11/06/18 0506 11/06/18 0519 11/06/18 0901  BP: (!) 124/56 (!) 133/59    Pulse: 75 73  72  Resp: 20 16  18   Temp: 97.9 F (36.6 C) 97.8 F (36.6 C)    TempSrc: Oral Oral    SpO2: 93% 95%  90%  Weight:   66.5 kg   Height:        Intake/Output Summary (Last 24 hours) at 11/06/2018 1037 Last data filed at 11/06/2018 0727 Gross per 24 hour  Intake 420 ml  Output 2100 ml  Net -1680 ml   Filed Weights   11/04/18 1641 11/05/18 0508 11/06/18 0519  Weight: 68.6 kg 64 kg 66.5 kg    Examination:  General exam: Appears chronically ill looking but not in any distress.   respiratory system: Bilateral decreased breath sounds at bases with scattered crackles, more on the right side Cardiovascular system: S1-S2 heard, rate controlled Gastrointestinal system: Abdomen is nondistended, soft and nontender. Normal bowel sounds heard. Extremities: No cyanosis; trace edema   Data Reviewed: I have personally reviewed following labs and imaging studies  CBC: Recent Labs  Lab 10/30/18 1556  10/31/18 0304  11/02/18 0336 11/03/18 0325 11/04/18 0350 11/05/18 0454 11/06/18 0445  WBC 16.0*  --  14.2*   < > 10.9* 12.6* 18.1* 14.8* 12.5*  NEUTROABS 14.6*  --  12.2*  --   --   --   --   --   --   HGB 11.9*   < > 11.3*   < > 11.6* 11.8* 11.4* 11.9* 12.0  HCT 36.3   < > 35.8*   < > 38.5 36.6 36.0 36.4 37.7  MCV 93.6  --  96.5   < > 96.0 93.8 93.8 88.3 94.0  PLT 298  --  298   < > 352 393 450* 458* 434*   < > = values in this interval not displayed.   Basic Metabolic Panel: Recent Labs  Lab 11/02/18 0336 11/03/18 0325 11/04/18 0350 11/05/18 0454 11/06/18 0445  NA 136  135 136 133* 137  K 4.5 3.6 3.8 4.1 3.6  CL 96* 94* 92* 93* 94*  CO2 27 28 28 29  34*  GLUCOSE 113* 268* 229* 316* 155*  BUN 10 16 24* 29* 28*  CREATININE 0.85 0.88 1.12* 0.96 0.95  CALCIUM 7.6* 7.9* 8.5* 8.8* 9.1  MG 2.3 1.9 2.0 1.9 2.0   GFR: Estimated Creatinine Clearance: 43.5 mL/min (by C-G formula based on SCr of 0.95 mg/dL). Liver Function Tests: Recent Labs  Lab 10/30/18 1556 10/31/18 0304 10/31/18 1215  AST 23 17  --   ALT 15 14  --   ALKPHOS 54 56  --   BILITOT 1.3* 1.1  --   PROT 6.2* 5.7* 6.2*  ALBUMIN 3.1* 2.8*  --    Recent Labs  Lab 10/30/18 1556  LIPASE 28   No results for input(s): AMMONIA in the last 168 hours. Coagulation Profile: Recent Labs  Lab 10/31/18 0048  INR 1.67   Cardiac Enzymes: Recent Labs  Lab 10/31/18 0048 10/31/18 0606 10/31/18 1215  TROPONINI 0.04* 0.04* 0.05*   BNP (last 3 results) Recent Labs  09/12/18 1130  PROBNP 175.0*   HbA1C: Recent Labs    11/06/18 0445  HGBA1C 6.8*   CBG: Recent Labs  Lab 11/05/18 0642 11/05/18 2127 11/06/18 0739  GLUCAP 267* 171* 144*   Lipid Profile: No results for input(s): CHOL, HDL, LDLCALC, TRIG, CHOLHDL, LDLDIRECT in the last 72 hours. Thyroid Function Tests: No results for input(s): TSH, T4TOTAL, FREET4, T3FREE, THYROIDAB in the last 72 hours. Anemia Panel: No results for input(s): VITAMINB12, FOLATE, FERRITIN, TIBC, IRON, RETICCTPCT in the last 72 hours. Sepsis Labs: Recent Labs  Lab 10/30/18 1603 10/30/18 1814 10/31/18 0048 10/31/18 0057 10/31/18 0304 11/01/18 1547 11/02/18 0336 11/03/18 0325  PROCALCITON  --   --  0.10  --   --  <0.10 0.15 <0.10  LATICACIDVEN 4.05* 2.10*  --  1.6 1.4  --   --   --     Recent Results (from the past 240 hour(s))  Blood Culture (routine x 2)     Status: None   Collection Time: 10/30/18  4:08 PM  Result Value Ref Range Status   Specimen Description   Final    BLOOD RIGHT ARM Performed at [facility], [place] [address] [place], [place] [address]    Special Requests   Final    BOTTLES DRAWN AEROBIC AND ANAEROBIC Blood Culture adequate volume Performed at [place] [address] [place], [place] [address]    Culture   Final    NO GROWTH 5 DAYS Performed at [facility], [place] [address], [place], [place] [address]    Report Status 11/04/2018 FINAL  Final  Urine culture     Status: Abnormal   Collection Time: 10/30/18  5:26 PM  Result Value Ref Range Status   Specimen Description   Final    URINE, RANDOM Performed at [place] [address], [place], [place] [address]    Special Requests   Final    NONE Performed at [facility], [place] [address], [place], [place] [address]    Culture (A)  Final    <10,000 COLONIES/mL INSIGNIFICANT GROWTH Performed at [place] [address], [place], [place] [address]    Report Status 11/01/2018 FINAL  Final  MRSA PCR Screening     Status: None   Collection Time: 10/30/18 11:55 PM  Result Value Ref Range Status   MRSA by PCR NEGATIVE NEGATIVE Final    Comment:        The GeneXpert MRSA Assay (FDA approved for NASAL specimens only), is one component of a comprehensive MRSA colonization surveillance program. It is not intended to diagnose MRSA infection nor to guide or monitor treatment for MRSA infections. Performed at [facility], [place] [address], [place], [place] [address]   Expectorated sputum assessment w rflx to resp cult     Status: None   Collection Time: 10/31/18  1:26 AM  Result Value Ref Range Status   Specimen Description SPUTUM  Final   Special Requests NONE  Final   Sputum evaluation   Final    THIS SPECIMEN IS ACCEPTABLE FOR SPUTUM CULTURE Performed at [facility], [place] [address], [place], [place] [address]    Report Status 10/31/2018 FINAL  Final  Culture, respiratory     Status: None   Collection Time:  10/31/18  1:26 AM  Result Value Ref Range Status   Specimen Description   Final    SPUTUM Performed at  [facility], [place] [address], [place], [place] [address]    Special Requests   Final    NONE Reflexed from [id number] Performed at [facility], [place] [address], [place], [place] [address]    Gram Stain   Final    ABUNDANT WBC PRESENT, PREDOMINANTLY PMN NO ORGANISMS SEEN    Culture   Final    RARE CANDIDA ALBICANS RARE HAEMOPHILUS INFLUENZAE BETA LACTAMASE NEGATIVE Performed at [facility], [place] [address], [place], [place] [address]    Report Status 11/02/2018 FINAL  Final  Blood Culture (routine x 2)     Status: None   Collection Time: 10/31/18  3:04 AM  Result Value Ref Range Status   Specimen Description   Final    BLOOD RIGHT HAND Performed at [facility], [place] [address] [place], [place] [address]    Special Requests   Final    AEROBIC BOTTLE ONLY Blood Culture adequate volume Performed at [place] [address] [place], [place] [address]    Culture   Final    NO GROWTH 5 DAYS Performed at [facility], [place] [address] [place], [place] [address]    Report Status 11/05/2018 FINAL  Final  Body fluid culture     Status: None   Collection Time: 10/31/18 10:20 AM  Result Value Ref Range Status   Specimen Description   Final    PLEURAL RIGHT Performed at [place] [address], [place], [place] [address]    Special Requests   Final    NONE Performed at [facility], [place] [address] [place], [place] [address]    Gram Stain   Final    WBC PRESENT, PREDOMINANTLY PMN NO ORGANISMS SEEN CYTOSPIN SMEAR    Culture   Final    NO GROWTH 3 DAYS Performed at [facility], [place] [address], [place], [place] [address]    Report Status 11/03/2018 FINAL  Final         Radiology Studies: Dg Chest 2 View  Result Date: 11/06/2018 CLINICAL  DATA:  Dyspnea. Right pleural effusion. History of right lung adenocarcinoma status post radiation and chemotherapy. EXAM: CHEST - 2 VIEW COMPARISON:  Chest x-rays dated 11/04/2018, 11/02/2018 and 10/31/2018 and chest CT dated 10/31/2018 FINDINGS: The moderate right pleural effusion has shifted and is now primarily at the base rather than at the base and right apex. The interstitial markings are accentuated on the left but there is no longer any consolidation. Small left effusion noted on the prior CT scan has completely resolved. Heart size and vascularity are normal. Aortic atherosclerosis. IMPRESSION: Moderate right effusion, persistent but slightly changed in position. Resolution of small left effusion. Aortic Atherosclerosis (ICD10-I70.0). Electronically Signed   By: Lorriane Shire M.D.   On: 11/06/2018 09:27   Dg Chest Bilateral Decubitus  Result Date: 11/06/2018 CLINICAL DATA:  Pleural effusions.  Dyspnea. EXAM: CHEST - BILATERAL DECUBITUS VIEW COMPARISON:  Chest x-rays dated 11/06/2018 and 11/04/2018 and 10/31/2018 FINDINGS: There is no residual left pleural effusion. There is a moderate right pleural effusion which shifts and layers laterally in the decubitus position. However, some of the effusion is loculated at the apex on the right. IMPRESSION: 1. Moderate right effusion some of which is free-flowing. 2. Resolution of small left effusion. Electronically Signed   By: Lorriane Shire M.D.   On: 11/06/2018 09:29        Scheduled  Meds: . apixaban  5 mg Oral BID  . atorvastatin  40 mg Oral q1800  . budesonide (PULMICORT) nebulizer solution  0.25 mg Nebulization BID  . digoxin  0.125 mg Oral Daily  . diltiazem  240 mg Oral Daily  . ferrous sulfate  325 mg Oral QPM  . fesoterodine  4 mg Oral Daily  . furosemide  80 mg Intravenous Daily  . insulin aspart  0-5 Units Subcutaneous QHS  . insulin aspart  0-9 Units Subcutaneous TID WC  . ipratropium-albuterol  3 mL Nebulization TID  . mouth  rinse  15 mL Mouth Rinse BID  . mirtazapine  15 mg Oral QHS  . multivitamin with minerals  1 tablet Oral Daily  . pantoprazole  40 mg Oral Daily  . predniSONE  40 mg Oral Q breakfast  . sertraline  50 mg Oral Daily   Continuous Infusions:    LOS: 7 days        Aline August, MD [facility] Pager [phone]  If 7PM-7AM, please contact night-coverage [email or web address] Password [facility] 11/06/2018, 10:37 AM

## 2018-11-06 NOTE — Progress Notes (Addendum)
NAME:  Pamela Mcguire, MRN:  [id number], DOB:  1935-06-22, LOS: 7 ADMISSION DATE:  10/30/2018, CONSULTATION DATE:  12/4 REFERRING MD:  [name], CHIEF COMPLAINT:  Acute hypoxic respiratory failure    Brief History   83 year old woman with stage IIIa adenocarcinoma status post chemoradiation who had a recurrent right pleural effusion that was tapped twice in 05/2018 and again in 07/2018 with negative cytology.  She is also a colon cancer survivor and has a history of chronic atrial fibrillation and diastolic heart failure.  She was admitted 12/3 with shortness of breath, fever and hypoxia, CT chest showing large right effusion.  Underwent thoracentesis with negative culture/cytology.  Treated as PNA + volume overload.  Pleural cholesterol consistent with transudate.   Significant Hospital Events   12/04 Admit w/ SOB.  Large R effusion > thora w/2L removed/exudative appearing 12/06 Effusion returned. Remains volume overloaded.  Treating as PNA+volume overload   12/08 PCCM s/o 12/10 PCCM called back to evaluate for pleurX catheter   Consults:  Pulmonary  Procedures:  12/4 Right thoracentesis > 2 liters cloudy orange appearing fluid removed  Significant Diagnostic Tests:  CT chest and abd 12/4: neg for PE, mod to large right effusion, chronic stable subpleural reticulations, rounded opacity right heart border. CT abd: w/ sub centimeter left adrenal nodule   CT chest post thora 12/4 -small apical right pneumothorax, left lower lobe airspace disease/consolidation, moderate left effusion  Micro Data:  BCX 12/4>> neg Resp culture 12/4 >> rare hemophilus Pleural fluid 12/4 >> neg Strep antigen 12/5 >> neg Legionella antigen 12/5 >> neg  Antimicrobials:  Cefepime 12/4 >> 12/8 Metronidazole 12/4 >> 12/4 Vanc 12/4 >> 12/4  Interim history/subjective:  I/O- net neg 6.1L since admit.  Pt reports central chest tightness, cough with bloody sputum production.   Objective   Blood pressure (!) 105/58,  pulse 82, temperature (!) 97.5 F (36.4 C), temperature source Oral, resp. rate 18, height 5\' 6"  (1.676 m), weight 66.5 kg, SpO2 92 %.        Intake/Output Summary (Last 24 hours) at 11/06/2018 1301 Last data filed at 11/06/2018 1200 Gross per 24 hour  Intake 780 ml  Output 2250 ml  Net -1470 ml   Filed Weights   11/04/18 1641 11/05/18 0508 11/06/18 0519  Weight: 68.6 kg 64 kg 66.5 kg   Examination: General: elderly female lying in bed in NAD HEENT: MM pink/moist, no jvd Neuro: Awake, alert, difficulty remembering details, MAE / appropriate  CV: s1s2 rrr, no m/r/g PULM: even/non-labored, lungs bilaterally clear anterior, diminished bases, old dark blood mixed in thick sputum, no scars on posterior back  GI: soft, non-tender, bsx4 active  Extremities: warm/dry, no LE edema  Skin: no rashes or lesions   Chest x-ray 12/10 >> loculated R apical effusion, small right free flowing effusion, ? Diaphragmatic tenting / volume loss   Resolved Hospital Problem list   Small right pneumothorax -favor ex vacuo  Assessment & Plan:   Acute on Chronic Hypoxic Respiratory Failure  -attributed to H.Flu PNA, acute on chronic diastolic heart failure /some degree of right atelectasis, kyphosis / restrictive physiology, PH on ECHO -2L dependent at baseline -ECHO 9/30 with PA peak pressure 35, RA/LA severely dilated P: Wean O2 for saturations >90% PT/OT efforts, mobilize IS DNR/DNI   Recurrent Right Effusion -pleural fluid removed in 06/16/2018 and 08/17/2018 with negative cytology.  Not a chylothorax. Cholesterol is upper limit normal for transudative process.  She has OSA and does not wear CPAP > ?  sleep apnea is contributing to worsening CHF/effusions  Clinically euvolemic on exam & 6.1L negative for admit.  WBC's increased on thora 12/4, ?parapneumonic process given recent H.Flu PNA > partial loculation RUL, some free flowing fluid on lateral decubitus 12/10 but do not feel this has increased.   She has pleural effusions similar to larger in size dating back to 07/2017, suspect element of lung entrapment at this point and chronic effusion.  P: Follow serial CXR Continue diuresis  No indication for pleurX catheter or thora at this point (effusion unchanged) Pulmonary hygiene - IS, mobilize  She needs outpatient repeat PET scan   Hemoptysis  -may be related to resolving PNA but worrisome given hx of lung cancer P: Hold FOB at this time > pt quite frail, recovering from PNA.  If Dr. Julien Nordmann feels after PET CT cancer has returned or ongoing hemoptysis, consider repeat FOB Consider holding Eliquis temporarily   "COPD "  -former smoker, age [age], 1ppd -spirometry from 2018 reviewed, no evidence of significant airway obstruction, FVC was 70% -concern wheezing is "cardiac asthma" / volume overload not bronchospasm  P: Wean prednisone to off   OSA -not on therapy, can not wear mask P:  O2 to maintain saturations > 90%  Afib, HLD, CAD and acute on chronic diastolic HF P: Continue lipitor, eliquis, lanoxin, cardizem per primary  Lasix 80 mg IV BID   Stage IIIb Adenocarcinoma, Lung / Colon CA Hx -s/p concurrent chemo and radiation with partial response completed 2016. Not considered a good candidate for consolidation chemotherapy.   -Currently on observation per Dr. Julien Nordmann since 04/2018 -cytology neg on Pleural fluid -No evidence of recurrence of malignancy at this time P: Monitor, follow up with ONC as outpatient   Noe Gens, NP-C [facility] Pgr: [phone] or if no answer [phone] 11/06/2018, 1:01 PM

## 2018-11-06 NOTE — Progress Notes (Addendum)
Daily Progress Note   Patient Name: Pamela Mcguire       Date: 11/06/2018 DOB: 02-Jan-1935  Age: 83 y.o. MRN#: [id number] Attending Physician: Aline August, MD Primary Care Physician: Hoyt Koch, MD Admit Date: 10/30/2018  Reason for Consultation/Follow-up: Establishing goals of care  Subjective: Ms. Karam is sitting in chair on entering the room.  States she is feeling better than admission, but still feels that she is ill.  We discussed again about her clinical course over the past few days as well as possible care plan moving forward.  She inquired about hospice and we discussed this today as well.  Length of Stay: 7  Current Medications: Scheduled Meds:  . apixaban  5 mg Oral BID  . atorvastatin  40 mg Oral q1800  . budesonide (PULMICORT) nebulizer solution  0.25 mg Nebulization BID  . digoxin  0.125 mg Oral Daily  . diltiazem  240 mg Oral Daily  . ferrous sulfate  325 mg Oral QPM  . fesoterodine  4 mg Oral Daily  . furosemide  80 mg Intravenous Daily  . insulin aspart  0-5 Units Subcutaneous QHS  . insulin aspart  0-9 Units Subcutaneous TID WC  . ipratropium-albuterol  3 mL Nebulization TID  . mouth rinse  15 mL Mouth Rinse BID  . mirtazapine  15 mg Oral QHS  . multivitamin with minerals  1 tablet Oral Daily  . pantoprazole  40 mg Oral Daily  . predniSONE  40 mg Oral Q breakfast  . sertraline  50 mg Oral Daily    Continuous Infusions:   PRN Meds: acetaminophen **OR** acetaminophen, guaiFENesin-dextromethorphan, haloperidol lactate, ipratropium-albuterol, morphine injection, ondansetron **OR** ondansetron (ZOFRAN) IV, sodium chloride flush, zolpidem  Physical Exam    General: Alert, awake, anxious, frail.  HEENT: No bruits, no goiter Heart: Rate  controlled. No murmur appreciated. Lungs: Decereased air movement R>L, crackles scattered throughout Abdomen: Soft, nontender, nondistended, positive bowel sounds.  Ext: No significant edema Skin: Warm and dry Neuro: Grossly intact, nonfocal.       Vital Signs: BP (!) 124/56 (BP Location: Right Arm)   Pulse 75   Temp 97.9 F (36.6 C) (Oral)   Resp 20   Ht 5\' 6"  (1.676 m)   Wt 64 kg   LMP  (LMP Unknown)   SpO2 93%  BMI 22.76 kg/m  SpO2: SpO2: 93 % O2 Device: O2 Device: Nasal Cannula O2 Flow Rate: O2 Flow Rate (L/min): 4 L/min  Intake/output summary:   Intake/Output Summary (Last 24 hours) at 11/06/2018 0037 Last data filed at 11/05/2018 2200 Gross per 24 hour  Intake 560 ml  Output 1850 ml  Net -1290 ml   LBM: Last BM Date: 11/03/18(per pt report) Baseline Weight: Weight: 70.3 kg Most recent weight: Weight: 64 kg       Palliative Assessment/Data:    Flowsheet Rows     Most Recent Value  Intake Tab  Referral Department  Hospitalist  Unit at Time of Referral  ICU  Palliative Care Primary Diagnosis  Pulmonary  Date Notified  10/31/18  Palliative Care Type  New Palliative care  Reason for referral  Clarify Goals of Care  Date of Admission  10/30/18  Date first seen by Palliative Care  11/01/18  # of days Palliative referral response time  1 Day(s)  # of days IP prior to Palliative referral  1  Clinical Assessment  Pain Max last 24 hours  0  Pain Min Last 24 hours  0  Dyspnea Max Last 24 Hours  7  Dyspnea Min Last 24 hours  7  Psychosocial & Spiritual Assessment  Palliative Care Outcomes  Patient/Family meeting held?  Yes  Who was at the meeting?  patient  Palliative Care Outcomes  Clarified goals of care      Patient Active Problem List   Diagnosis Date Noted  . Acute respiratory failure with hypoxia ([place])   . Pleural effusion on right   . SIRS (systemic inflammatory response syndrome) ([place]) 10/30/2018  . Chronic respiratory failure with hypoxia  ([place]), 2L home O2 08/29/2018  . SOB (shortness of breath) 08/29/2018  . Acute on chronic diastolic CHF (congestive heart failure) ([place]) 08/29/2018  . COPD exacerbation ([place]) 08/26/2017  . Pleural effusion transudative 08/22/2017  . Medicare annual wellness visit, subsequent 07/10/2017  . Encounter for general adult medical examination with abnormal findings 07/10/2017  . Insomnia due to medical condition 04/18/2017  . Blurred vision 04/18/2017  . Cancer of ascending colon s/p lap colectomy 02/08/2017 02/08/2017  . Iron deficiency anemia   . Fundic gland polyps of stomach, benign   . Fracture of L1 vertebra ([place]) 10/19/2015  . Weakness   . Compression fracture 09/13/2015  . Mobility impaired   . Fatigue 08/18/2015  . Major depression 08/18/2015  . Frequent stools 08/18/2015  . Tachypnea 06/22/2015  . Adenocarcinoma of upper lobe of right lung 04/30/2015  . Mediastinal adenopathy 04/24/2015  . Severe obstructive sleep apnea 03/27/2015  . Lung mass 03/27/2015  . Folate deficiency 09/21/2014  . Paroxysmal a-fib ([place]) 05/27/2014  . Essential hypertension 05/27/2014  . Breast cancer ([place]) 04/17/2014  . Prediabetes 04/17/2014  . CAD (coronary artery disease) 04/16/2014  . Hyperlipidemia 04/16/2014    Palliative Care Assessment & Plan   Patient Profile: 83 y.o. female  with past medical history of diastolic CHF, a fib, HLP, sleep apnea, COPD, lung ca, recurrent pleural effusion admitted on 10/30/2018 with SOB and R sided pleural effusion.  S/p thoracentesis.  Palliative consulted for [place].   Recommendations/Plan: - DNR/DNI - Reports that she is "better each day" and hopeful that she regains some functional status.  At her request, we discussed hospice philosophy and how this may benefit her at some point in the future. - Will ask member of PMT to continue to follow.   Code Status:  Code Status Orders  (From admission, onward)         Start     Ordered   10/31/18 1021  Do  not attempt resuscitation (DNR)  Continuous     10/31/18 1020        Code Status History    Date Active Date Inactive Code Status Order ID Comments User Context   10/31/2018 0802 10/31/2018 1020 Full Code [id number]  Aline August, MD Inpatient   10/31/2018 0019 10/31/2018 0802 Partial Code [id number]  Rise Patience, MD Inpatient   08/24/2018 2201 08/30/2018 1859 Partial Code [id number]  Merton Border, MD Inpatient   08/25/2017 0215 08/27/2017 1953 Full Code [id number]  Jani Gravel, MD ED   08/05/2017 1741 08/09/2017 1848 Full Code [id number]  Benito Mccreedy, MD Inpatient   02/08/2017 1239 02/15/2017 1935 Full Code [id number]  Leighton Ruff, MD Inpatient   09/13/2015 1819 09/17/2015 1747 Full Code [id number]  Kelvin Cellar, MD Inpatient   06/22/2015 0240 06/26/2015 1925 Full Code [id number]  Theressa Millard, MD Inpatient       Prognosis:  Guarded  Discharge Planning:  To Be Determined  Care plan was discussed with Dr. [name]  Thank you for allowing the Palliative Medicine Team to assist in the care of this patient.   Total Time 25 Prolonged Time Billed No      Greater than 50%  of this time was spent counseling and coordinating care related to the above assessment and plan.  Micheline Rough, MD  Please contact Palliative Medicine Team phone at [phone] for questions and concerns.

## 2018-11-06 NOTE — Evaluation (Signed)
Occupational Therapy Evaluation Patient Details Name: Pamela Mcguire MRN: [id number] DOB: 01-Nov-1935 Today's Date: 11/06/2018    History of Present Illness 83 y.o. female with past medical history significant for right lung adenocarcinoma status post radiation and chemotherapy in the past and now on surveillance and no active therapy, history of breast cancer status post right mastectomy and colon cancer in remission, COPD, L1 compression fx, afib and pt was found to be hypotensive, tachycardic and febrile in the ED.  CT scan of the chest and abdomen showed large right-sided pleural effusion   Clinical Impression   Pt was admitted for the above. At baseline, she lives at [facility] and is independent with adls; assist only for medication.  Pt needs min A for mobility and up to max A for LB adls at this time.  She will benefit from continued OT; goals in acute setting are for min guard to min A    Follow Up Recommendations  SNF    Equipment Recommendations  3 in 1 bedside commode    Recommendations for Other Services       Precautions / Restrictions Precautions Precautions: Fall Precaution Comments: chronic oxygen (2L O2 baseline) Restrictions Weight Bearing Restrictions: No      Mobility Bed Mobility         Supine to sit: Min assist;HOB elevated     General bed mobility comments: light assist for trunk for OOB  Transfers   Equipment used: Rolling walker (2 wheeled);None   Sit to Stand: Min assist Stand pivot transfers: Min assist       General transfer comment: used RW for hygiene and second transfer to recliner    Balance                                           ADL either performed or assessed with clinical judgement   ADL Overall ADL's : Needs assistance/impaired             Lower Body Bathing: Moderate assistance;Sit to/from stand       Lower Body Dressing: Maximal assistance;Sit to/from stand   Toilet Transfer: Minimal  assistance;Stand-pivot;BSC;RW   Toileting- Clothing Manipulation and Hygiene: Minimal assistance;Moderate assistance;Sit to/from stand         General ADL Comments: pt is able to perform UB adls with set up.  Needed to use toilet then transferred to chair for breakfast     Vision         Perception     Praxis      Pertinent Vitals/Pain Pain Assessment: No/denies pain     Hand Dominance     Extremity/Trunk Assessment Upper Extremity Assessment Upper Extremity Assessment: Generalized weakness           Communication Communication Communication: HOH   Cognition Arousal/Alertness: Awake/alert Behavior During Therapy: WFL for tasks assessed/performed Overall Cognitive Status: No family/caregiver present to determine baseline cognitive functioning                                 General Comments: had difficulty with some questions, "I don't know"  Did provide PLOF   General Comments       Exercises     Shoulder Instructions      Home Living Family/patient expects to be discharged to:: Assisted living     Type of  Home: Independent living facility                       Home Equipment: Walker - 4 wheels   Additional Comments: chronic 2L O2      Prior Functioning/Environment Level of Independence: Needs assistance    ADL's / Homemaking Assistance Needed: pt reports she can perform her own ADLs, ILF only assists with meds            OT Problem List: Decreased strength;Decreased activity tolerance;Pain;Impaired balance (sitting and/or standing);Decreased cognition;Decreased safety awareness;Decreased knowledge of use of DME or AE      OT Treatment/Interventions: Self-care/ADL training;DME and/or AE instruction;Therapeutic activities;Patient/family education;Balance training;Cognitive remediation/compensation;energy conservation   OT Goals(Current goals can be found in the care plan section) Acute Rehab OT Goals Patient Stated  Goal: none stated; agreeable to OT eval and OOB OT Goal Formulation: With patient Time For Goal Achievement: 11/20/18 Potential to Achieve Goals: Fair ADL Goals Pt Will Perform Grooming: with supervision;standing Pt Will Perform Lower Body Bathing: with min assist;sit to/from stand Pt Will Perform Lower Body Dressing: with min assist;sit to/from stand Pt Will Transfer to Toilet: with min guard assist;ambulating;bedside commode Pt Will Perform Toileting - Clothing Manipulation and hygiene: with min guard assist;sit to/from stand Additional ADL Goal #1: pt will perform bed mobility at supervision level in preparation for adls  OT Frequency: Min 2X/week   Barriers to D/C:            Co-evaluation              AM-PAC OT "6 Clicks" Daily Activity     Outcome Measure Help from another person eating meals?: None Help from another person taking care of personal grooming?: None Help from another person toileting, which includes using toliet, bedpan, or urinal?: A Lot Help from another person bathing (including washing, rinsing, drying)?: A Lot Help from another person to put on and taking off regular upper body clothing?: A Little Help from another person to put on and taking off regular lower body clothing?: A Lot 6 Click Score: 17   End of Session    Activity Tolerance: Patient tolerated treatment well Patient left: in chair;with call bell/phone within reach;with chair alarm set  OT Visit Diagnosis: Unsteadiness on feet (R26.81);Muscle weakness (generalized) (M62.81)                Time: 4034-7425 OT Time Calculation (min): 23 min Charges:  OT General Charges $OT Visit: 1 Visit OT Evaluation $OT Eval Low Complexity: [place], OTR/L Acute Rehabilitation Services [phone] [facility] pager [phone] office 11/06/2018  [place] 11/06/2018, 10:31 AM

## 2018-11-07 ENCOUNTER — Other Ambulatory Visit: Payer: Self-pay | Admitting: Internal Medicine

## 2018-11-07 LAB — BASIC METABOLIC PANEL
Anion gap: 12 (ref 5–15)
BUN: 30 mg/dL — ABNORMAL HIGH (ref 8–23)
CO2: 36 mmol/L — ABNORMAL HIGH (ref 22–32)
CREATININE: 0.96 mg/dL (ref 0.44–1.00)
Calcium: 9 mg/dL (ref 8.9–10.3)
Chloride: 90 mmol/L — ABNORMAL LOW (ref 98–111)
GFR calc Af Amer: >60 mL/min (ref >60)
GFR, EST NON AFRICAN AMERICAN: 55 mL/min — AB (ref >60)
GLUCOSE: 198 mg/dL — AB (ref 70–99)
Potassium: 3.9 mmol/L (ref 3.5–5.1)
SODIUM: 138 mmol/L (ref 135–145)

## 2018-11-07 LAB — CBC
HCT: 38.1 % (ref 36.0–46.0)
Hemoglobin: 12.2 g/dL (ref 12.0–15.0)
MCH: 29.9 pg (ref 26.0–34.0)
MCHC: 32 g/dL (ref 30.0–36.0)
MCV: 93.4 fL (ref 80.0–100.0)
NRBC: 0 % (ref 0.0–0.2)
Platelets: 496 10*3/uL — ABNORMAL HIGH (ref 150–400)
RBC: 4.08 MIL/uL (ref 3.87–5.11)
RDW: 13.3 % (ref 11.5–15.5)
WBC: 13.2 10*3/uL — ABNORMAL HIGH (ref 4.0–10.5)

## 2018-11-07 LAB — GLUCOSE, CAPILLARY
Glucose-Capillary: 169 mg/dL — ABNORMAL HIGH (ref 70–99)
Glucose-Capillary: 224 mg/dL — ABNORMAL HIGH (ref 70–99)
Glucose-Capillary: 327 mg/dL — ABNORMAL HIGH (ref 70–99)
Glucose-Capillary: 234 mg/dL — ABNORMAL HIGH (ref 70–99)

## 2018-11-07 LAB — MAGNESIUM: Magnesium: 1.9 mg/dL (ref 1.7–2.4)

## 2018-11-07 MED ORDER — FUROSEMIDE 40 MG PO TABS
80.0000 mg | ORAL_TABLET | Freq: Two times a day (BID) | ORAL | Status: DC
  Administered 2018-11-07 – 2018-11-12 (×11): 80 mg via ORAL
  Filled 2018-11-07 (×11): qty 2

## 2018-11-07 MED ORDER — PREDNISONE 20 MG PO TABS
20.0000 mg | ORAL_TABLET | Freq: Every day | ORAL | Status: DC
Start: 2018-11-08 — End: 2018-11-10
  Administered 2018-11-08 – 2018-11-10 (×3): 20 mg via ORAL
  Filled 2018-11-07 (×3): qty 1

## 2018-11-07 NOTE — Progress Notes (Signed)
Met with pt at bedside and daughter via phone to discuss disposition plans. Pt states she is still considering need for hospice going forward but very undecided about her wishes. States she is unsure about wanting rehab but also admitting she is not at baseline to be able to manage at home alone. Asks for daughter's input- daughter would prefer for pt to come home with her but since 24/7 supervision not available, feels pt would be safer if able to ambulate better prior to returning home. States, "If she gets to the point that she could get up to the bathroom alone, I think we could manage better then." Daughter undecided about hospice post DC as well, "We just don't know what's needed." Asked for CSW to make referrals to area SNFs to investigate rehab options. Made referrals and will continue to follow.  Sharren Bridge, MSW, LCSW Clinical Social Work 11/07/2018 [phone]

## 2018-11-07 NOTE — Progress Notes (Signed)
   11/07/18 1558  Clinical Encounter Type  Visited With Patient  Visit Type Initial  Referral From Palliative care team  Consult/Referral To Chaplain  The chaplain introduced herself to the Pt. in a spiritual care visit.  The chaplain was welcomed into the room by the Pt.  The chaplain recognized after the Pt. received a phone call from her priest,  the conversation was interfering with her breathing.  Both the Pt. and the chaplain decided rest may be the best option.  The Pt. valued the chaplains spiritual care visit before the chaplain left the room.

## 2018-11-07 NOTE — Progress Notes (Addendum)
Patient ID: Pamela Mcguire, female   DOB: 29-Aug-1935, 83 y.o.   MRN: [id number]  PROGRESS NOTE    Pamela Mcguire  RXV:[id number] DOB: 07/15/1935 DOA: 10/30/2018 PCP: Hoyt Koch, MD   Brief Narrative:  83 year old female with history of diastolic CHF, atrial fibrillation, hyperlipidemia, sleep apnea, COPD presented with worsening shortness of breath and chest pain.  She was found to be hypotensive, tachycardic and febrile in the ED.  CT scan of the chest and abdomen showed large right-sided pleural effusion.  She was started on broad-spectrum antibiotics and IV fluids.  Subsequently she became more hypoxic requiring BiPAP.  PCCM was consulted.  She had right-sided thoracentesis done on 10/31/2018.   Assessment & Plan:   Principal Problem:   SIRS (systemic inflammatory response syndrome) (HCC) Active Problems:   Paroxysmal a-fib (HCC)   Essential hypertension   Severe obstructive sleep apnea   Adenocarcinoma of upper lobe of right lung   Breast cancer (HCC)   CAD (coronary artery disease)   COPD exacerbation (HCC)   Chronic respiratory failure with hypoxia (HCC), 2L home O2   Pleural effusion on right   Acute respiratory failure with hypoxia (HCC)  Sepsis/SIRS most likely secondary to pneumonia: Present on admission -Cultures negative so far.  Hemodynamically stable. -Sepsis has resolved  Acute hypoxic respiratory failure secondary to pneumonia/bilateral pleural effusion/pulmonary edema -Patient required BiPAP for little while on 10/31/2018.   -Respiratory status improving.  Currently on 3 L oxygen via nasal cannula.  PCCM following   bilateral pleural effusion status post right-sided thoracentesis and subsequent right-sided small pneumothorax -Patient had right-sided thoracentesis and 2 L fluid removed on 10/31/2018 and subsequently CT scan of the chest showed small right-sided pneumothorax and moderate left-sided pleural effusion along with consolidation on the left side -PCCM was  following and had signed off.  PCCM was reconsulted on 11/06/2018 for recurrent right-sided pleural effusion: For now no need for repeat thoracentesis or Pleurx catheter as per PCCM.  Patient might need bronchoscopy at some point to rule out endobronchial lesions. -Lasix was increased to 80 mg IV twice a day on 11/06/2018.  Negative balance of 6413 cc since admission.  Change Lasix to 80 mg twice a day orally. -Patient is having intermittent hemoptysis.  Eliquis held on 11/06/2018 as per PCCM.  Community-acquired bacterial multilobar pneumonia -Cefepime was discontinued on 11/04/2018 by PCCM.  Cultures negative so far.    Strep pneumoniae antigen is negative  Acute on chronic diastolic CHF -EF in September 2019 was 60 to 65%.  Diuretic plan as above.  Strict input and output.  Daily weights.  Outpatient follow-up with cardiology  Chest pain with only minimally elevated troponin -Most likely secondary to above.  Management as above  COPD with mild exacerbation -Continue steroid tapering.  Taper off in the next 2 days.  Continue nebs and Pulmicort next  Paroxysmal A. fib -Rate controlled.  Eliquis held.  Continue digoxin and Cardizem  Leukocytosis -Probably reactive from high-dose steroid use.  Improving.  Repeat a.m. labs  Hypokalemia -Replaced  Hypomagnesemia -Replaced.   History of lung cancer, breast cancer and colon cancer in remission -Outpatient follow-up with Dr. Julien Nordmann.  Generalized deconditioning -PT recommends SNF placement.  Social worker consulted -Overall prognosis is guarded to poor.  If condition does not improve, consider hospice/comfort measures.  Palliative care following.    DVT prophylaxis: Eliquis held on 11/06/2018 per PCCM for hemoptysis Code Status: DNR Family Communication: None at bedside.  Spoke to son-in-law Eulas Post on phone on  11/06/2018 Disposition Plan: Probable SNF in the next 1 to 3 days if cleared by PCCM and patient remains hemodynamically  stable  Consultants: PCCM/palliative care  Procedures: Right-sided thoracentesis on 10/31/2018 by PCCM  Antimicrobials: Cefepime from 10/30/2018-11/04/2018  vancomycin 10/30/2018-10/31/2018   Subjective: Patient seen and examined at bedside.  Feels very tired and weak but feels slightly better.  Still short of breath with minimal exertion.  No overnight fever, vomiting or chest pains.  Still complains of reddish sputum. Objective: Vitals:   11/06/18 2021 11/06/18 2125 11/07/18 0532 11/07/18 0814  BP: (!) 109/57  (!) 115/56   Pulse: 79  (!) 53   Resp: 20  20   Temp: 99.5 F (37.5 C)  98 F (36.7 C)   TempSrc: Oral  Oral   SpO2: 96% 96% 96% 93%  Weight:   65.7 kg   Height:        Intake/Output Summary (Last 24 hours) at 11/07/2018 0927 Last data filed at 11/07/2018 0600 Gross per 24 hour  Intake 960 ml  Output 1400 ml  Net -440 ml   Filed Weights   11/05/18 0508 11/06/18 0519 11/07/18 0532  Weight: 64 kg 66.5 kg 65.7 kg    Examination:  General exam: Appears chronically ill looking.  No acute distress.   respiratory system: Bilateral decreased breath sounds at bases with scattered crackles, more on the right side.   Cardiovascular system: S1-S2 heard, intermittent bradycardia Gastrointestinal system: Abdomen is nondistended, soft and nontender. Normal bowel sounds heard. Extremities: No cyanosis; trace edema   Data Reviewed: I have personally reviewed following labs and imaging studies  CBC: Recent Labs  Lab 11/03/18 0325 11/04/18 0350 11/05/18 0454 11/06/18 0445 11/07/18 0506  WBC 12.6* 18.1* 14.8* 12.5* 13.2*  HGB 11.8* 11.4* 11.9* 12.0 12.2  HCT 36.6 36.0 36.4 37.7 38.1  MCV 93.8 93.8 88.3 94.0 93.4  PLT 393 450* 458* 434* 097*   Basic Metabolic Panel: Recent Labs  Lab 11/03/18 0325 11/04/18 0350 11/05/18 0454 11/06/18 0445 11/07/18 0506  NA 135 136 133* 137 138  K 3.6 3.8 4.1 3.6 3.9  CL 94* 92* 93* 94* 90*  CO2 28 28 29  34* 36*  GLUCOSE 268*  229* 316* 155* 198*  BUN 16 24* 29* 28* 30*  CREATININE 0.88 1.12* 0.96 0.95 0.96  CALCIUM 7.9* 8.5* 8.8* 9.1 9.0  MG 1.9 2.0 1.9 2.0 1.9   GFR: Estimated Creatinine Clearance: 43 mL/min (by C-G formula based on SCr of 0.96 mg/dL). Liver Function Tests: Recent Labs  Lab 10/31/18 1215  PROT 6.2*   No results for input(s): LIPASE, AMYLASE in the last 168 hours. No results for input(s): AMMONIA in the last 168 hours. Coagulation Profile: No results for input(s): INR, PROTIME in the last 168 hours. Cardiac Enzymes: Recent Labs  Lab 10/31/18 1215  TROPONINI 0.05*   BNP (last 3 results) Recent Labs    09/12/18 1130  PROBNP 175.0*   HbA1C: Recent Labs    11/06/18 0445  HGBA1C 6.8*   CBG: Recent Labs  Lab 11/06/18 0739 11/06/18 1122 11/06/18 1701 11/06/18 2025 11/07/18 0712  GLUCAP 144* 160* 274* 246* 169*   Lipid Profile: No results for input(s): CHOL, HDL, LDLCALC, TRIG, CHOLHDL, LDLDIRECT in the last 72 hours. Thyroid Function Tests: No results for input(s): TSH, T4TOTAL, FREET4, T3FREE, THYROIDAB in the last 72 hours. Anemia Panel: No results for input(s): VITAMINB12, FOLATE, FERRITIN, TIBC, IRON, RETICCTPCT in the last 72 hours. Sepsis Labs: Recent Labs  Lab 11/01/18  1547 11/02/18 0336 11/03/18 0325  PROCALCITON <0.10 0.15 <0.10    Recent Results (from the past 240 hour(s))  Blood Culture (routine x 2)     Status: None   Collection Time: 10/30/18  4:08 PM  Result Value Ref Range Status   Specimen Description   Final    BLOOD RIGHT ARM Performed at [facility] [address], [place], [place] [address]    Special Requests   Final    BOTTLES DRAWN AEROBIC AND ANAEROBIC Blood Culture adequate volume Performed at [facility] [address], [place], [place] [address]    Culture   Final    NO GROWTH 5 DAYS Performed at [facility], [place] [address], [place], [place] [address]    Report Status  11/04/2018 FINAL  Final  Urine culture     Status: Abnormal   Collection Time: 10/30/18  5:26 PM  Result Value Ref Range Status   Specimen Description   Final    URINE, RANDOM Performed at [facility] [address], [place], [place] [address]    Special Requests   Final    NONE Performed at [facility], [place] [address], [place], [place] [address]    Culture (A)  Final    <10,000 COLONIES/mL INSIGNIFICANT GROWTH Performed at [facility] [address] [place], [place] [address]    Report Status 11/01/2018 FINAL  Final  MRSA PCR Screening     Status: None   Collection Time: 10/30/18 11:55 PM  Result Value Ref Range Status   MRSA by PCR NEGATIVE NEGATIVE Final    Comment:        The GeneXpert MRSA Assay (FDA approved for NASAL specimens only), is one component of a comprehensive MRSA colonization surveillance program. It is not intended to diagnose MRSA infection nor to guide or monitor treatment for MRSA infections. Performed at [facility], [place] [address] [place], [place] [address]   Expectorated sputum assessment w rflx to resp cult     Status: None   Collection Time: 10/31/18  1:26 AM  Result Value Ref Range Status   Specimen Description SPUTUM  Final   Special Requests NONE  Final   Sputum evaluation   Final    THIS SPECIMEN IS ACCEPTABLE FOR SPUTUM CULTURE Performed at [facility], [place] [address], [place], [place] [address]    Report Status 10/31/2018 FINAL  Final  Culture, respiratory     Status: None   Collection Time: 10/31/18  1:26 AM  Result Value Ref Range Status   Specimen Description   Final    SPUTUM Performed at [facility] [address], [place], [place] [address]    Special Requests   Final    NONE Reflexed from [id number] Performed at [facility], [place] [address], [place], [place] [address]    Gram Stain   Final     ABUNDANT WBC PRESENT, PREDOMINANTLY PMN NO ORGANISMS SEEN    Culture   Final    RARE CANDIDA ALBICANS RARE HAEMOPHILUS INFLUENZAE BETA LACTAMASE NEGATIVE Performed at [facility], [place] [address], [place],  [address]    Report Status 11/02/2018 FINAL  Final  Blood Culture (routine x 2)     Status: None   Collection Time: 10/31/18  3:04 AM  Result Value Ref Range Status   Specimen Description   Final    BLOOD RIGHT  HAND Performed at [facility], [place] [address] [place], [place] [address]    Special Requests   Final    AEROBIC BOTTLE ONLY Blood Culture adequate volume Performed at [facility] [address], [place], [place] [address]    Culture   Final    NO GROWTH 5 DAYS Performed at [facility], [place] [address], [place], [place] [address]    Report Status 11/05/2018 FINAL  Final  Body fluid culture     Status: None   Collection Time: 10/31/18 10:20 AM  Result Value Ref Range Status   Specimen Description   Final    PLEURAL RIGHT Performed at [facility] [address], [place], [place] [address]    Special Requests   Final    NONE Performed at [facility], [place] [address], [place], [place] [address]    Gram Stain   Final    WBC PRESENT, PREDOMINANTLY PMN NO ORGANISMS SEEN CYTOSPIN SMEAR    Culture   Final    NO GROWTH 3 DAYS Performed at [facility], [place] [address], [place],  [address]    Report Status 11/03/2018 FINAL  Final         Radiology Studies: Dg Chest 2 View  Result Date: 11/06/2018 CLINICAL DATA:  Dyspnea. Right pleural effusion. History of right lung adenocarcinoma status post radiation and chemotherapy. EXAM: CHEST - 2 VIEW COMPARISON:  Chest x-rays dated 11/04/2018, 11/02/2018 and 10/31/2018 and chest CT dated 10/31/2018 FINDINGS: The moderate right pleural effusion has shifted and is now primarily at the base rather than at the base and right  apex. The interstitial markings are accentuated on the left but there is no longer any consolidation. Small left effusion noted on the prior CT scan has completely resolved. Heart size and vascularity are normal. Aortic atherosclerosis. IMPRESSION: Moderate right effusion, persistent but slightly changed in position. Resolution of small left effusion. Aortic Atherosclerosis (ICD10-I70.0). Electronically Signed   By: Lorriane Shire M.D.   On: 11/06/2018 09:27   Dg Chest Bilateral Decubitus  Result Date: 11/06/2018 CLINICAL DATA:  Pleural effusions.  Dyspnea. EXAM: CHEST - BILATERAL DECUBITUS VIEW COMPARISON:  Chest x-rays dated 11/06/2018 and 11/04/2018 and 10/31/2018 FINDINGS: There is no residual left pleural effusion. There is a moderate right pleural effusion which shifts and layers laterally in the decubitus position. However, some of the effusion is loculated at the apex on the right. IMPRESSION: 1. Moderate right effusion some of which is free-flowing. 2. Resolution of small left effusion. Electronically Signed   By: Lorriane Shire M.D.   On: 11/06/2018 09:29        Scheduled Meds: . atorvastatin  40 mg Oral q1800  . budesonide (PULMICORT) nebulizer solution  0.25 mg Nebulization BID  . digoxin  0.125 mg Oral Daily  . diltiazem  240 mg Oral Daily  . ferrous sulfate  325 mg Oral QPM  . fesoterodine  4 mg Oral Daily  . furosemide  80 mg Intravenous BID  . insulin aspart  0-5 Units Subcutaneous QHS  . insulin aspart  0-9 Units Subcutaneous TID WC  . ipratropium-albuterol  3 mL Nebulization TID  . mouth rinse  15 mL Mouth Rinse BID  . mirtazapine  15 mg Oral QHS  . multivitamin with minerals  1 tablet Oral Daily  . pantoprazole  40 mg Oral Daily  . predniSONE  30 mg Oral Q breakfast  . sertraline  50  mg Oral Daily   Continuous Infusions:    LOS: 8 days        Aline August, MD [facility] Pager [phone]  If 7PM-7AM, please contact  night-coverage [email or web address] Password [facility] 11/07/2018, 9:27 AM

## 2018-11-08 ENCOUNTER — Inpatient Hospital Stay (HOSPITAL_COMMUNITY): Payer: Medicare HMO

## 2018-11-08 DIAGNOSIS — J811 Chronic pulmonary edema: Secondary | ICD-10-CM

## 2018-11-08 DIAGNOSIS — J9602 Acute respiratory failure with hypercapnia: Secondary | ICD-10-CM

## 2018-11-08 LAB — CBC
HCT: 38.2 % (ref 36.0–46.0)
Hemoglobin: 12.1 g/dL (ref 12.0–15.0)
MCH: 29.6 pg (ref 26.0–34.0)
MCHC: 31.7 g/dL (ref 30.0–36.0)
MCV: 93.4 fL (ref 80.0–100.0)
NRBC: 0 % (ref 0.0–0.2)
Platelets: 507 10*3/uL — ABNORMAL HIGH (ref 150–400)
RBC: 4.09 MIL/uL (ref 3.87–5.11)
RDW: 13.4 % (ref 11.5–15.5)
WBC: 11.7 10*3/uL — ABNORMAL HIGH (ref 4.0–10.5)

## 2018-11-08 LAB — BASIC METABOLIC PANEL
Anion gap: 10 (ref 5–15)
BUN: 23 mg/dL (ref 8–23)
CO2: 37 mmol/L — ABNORMAL HIGH (ref 22–32)
Calcium: 8.8 mg/dL — ABNORMAL LOW (ref 8.9–10.3)
Chloride: 93 mmol/L — ABNORMAL LOW (ref 98–111)
Creatinine, Ser: 0.82 mg/dL (ref 0.44–1.00)
GFR calc Af Amer: >60 mL/min (ref >60)
GFR calc non Af Amer: >60 mL/min (ref >60)
GLUCOSE: 146 mg/dL — AB (ref 70–99)
Potassium: 3.6 mmol/L (ref 3.5–5.1)
Sodium: 140 mmol/L (ref 135–145)

## 2018-11-08 LAB — GLUCOSE, CAPILLARY
GLUCOSE-CAPILLARY: 255 mg/dL — AB (ref 70–99)
Glucose-Capillary: 142 mg/dL — ABNORMAL HIGH (ref 70–99)
Glucose-Capillary: 117 mg/dL — ABNORMAL HIGH (ref 70–99)
Glucose-Capillary: 221 mg/dL — ABNORMAL HIGH (ref 70–99)
Glucose-Capillary: 199 mg/dL — ABNORMAL HIGH (ref 70–99)

## 2018-11-08 LAB — MAGNESIUM: Magnesium: 1.8 mg/dL (ref 1.7–2.4)

## 2018-11-08 MED ORDER — APIXABAN 5 MG PO TABS
5.0000 mg | ORAL_TABLET | Freq: Two times a day (BID) | ORAL | Status: DC
  Administered 2018-11-09 – 2018-11-12 (×7): 5 mg via ORAL
  Filled 2018-11-08 (×7): qty 1

## 2018-11-08 MED ORDER — STROKE: EARLY STAGES OF RECOVERY BOOK: Freq: Once | Status: DC

## 2018-11-08 NOTE — Progress Notes (Signed)
PT Cancellation Note  Patient Details Name: Pamela Mcguire MRN: [id number] DOB: [birth year]   Cancelled Treatment:    Reason Eval/Treat Not Completed: Patient at procedure or test/unavailable. Will check back another time/day. Thanks.    Weston Anna, PT Acute Rehabilitation Services Pager: [phone] Office: [phone]

## 2018-11-08 NOTE — Progress Notes (Signed)
PROGRESS NOTE  Pamela Mcguire ZPH:[id number] DOB: Jul 16, 1935 DOA: 10/30/2018 PCP: Hoyt Koch, MD  HPI/Recap of past 24 hours:  C/o feeling weak and dizzy She has poor short term memory, can not tell me if she is feeling better compare to a few days ago, she replies : I do not remember:  She is aaox3, no edema She is on 2liter oxygen supplement, denies chest pain, no cough noted during encounter  Assessment/Plan: Principal Problem:   SIRS (systemic inflammatory response syndrome) ([place]) Active Problems:   Paroxysmal a-fib (HCC)   Essential hypertension   Severe obstructive sleep apnea   Adenocarcinoma of upper lobe of right lung   Breast cancer ([place])   CAD (coronary artery disease)   COPD exacerbation (HCC)   Chronic respiratory failure with hypoxia ([place]), 2L home O2   Pleural effusion on right   Acute respiratory failure with hypoxia (HCC)    Dizziness: -Does not appear orthostatic -mri brain with "1. Approximately seven punctate acute infarcts are scattered in the right hemisphere involving the frontal, temporal, and occipital lobes. No associated hemorrhage or mass effect. 2. Underlying chronic small vessel disease and prior right PCA territory infarct. 3. By patient request the examination had to be discontinued prior to completion." -As discussed with neurology on-call Dr. Leonel Ramsay who recommend resume Eliquis if no active bleeding, head carotic ultrasound (Eliquis was on hold since December 10 due to hemoptysis, patient hemoglobin has been stable, will resume Eliquis monitor hemoglobin)  Sepsis/SIRS most likely secondary to pneumonia: Present on admission -Cultures negative so far.  Hemodynamically stable. -Sepsis has resolved  Acute hypoxic respiratory failure secondary to pneumonia/bilateral pleural effusion/pulmonary edema -Patient required BiPAP for little while on 10/31/2018.   -Respiratory status improving.  Currently on 3 L oxygen via nasal  cannula.  PCCM following   bilateral pleural effusion status post right-sided thoracentesis and subsequent right-sided small pneumothorax -Patient had right-sided thoracentesis and 2 L fluid removed on 10/31/2018 and subsequently CT scan of the chest showed small right-sided pneumothorax and moderate left-sided pleural effusion along with consolidation on the left side -PCCM was following and had signed off.  PCCM was reconsulted on 11/06/2018 for recurrent right-sided pleural effusion: For now no need for repeat thoracentesis or Pleurx catheter as per PCCM.  Patient might need bronchoscopy at some point to rule out endobronchial lesions. -Lasix was increased to 80 mg IV twice a day on 11/06/2018.  Negative balance of 6413 cc since admission.  Change Lasix to 80 mg twice a day orally. -Patient is having intermittent hemoptysis.  Eliquis held on 11/06/2018 as per PCCM.  Community-acquired bacterial multilobar pneumonia -Cefepime was discontinued on 11/04/2018 by PCCM.  Cultures negative so far.    Strep pneumoniae antigen is negative  Acute on chronic diastolic CHF -EF in September 2019 was 60 to 65%.  Diuretic plan as above.  Strict input and output.  Daily weights.  Outpatient follow-up with cardiology  Chest pain with only minimally elevated troponin -Most likely secondary to above.  Management as above  COPD with mild exacerbation -Continue steroid tapering.  Taper off in the next 2 days.  Continue nebs and Pulmicort next  Paroxysmal A. fib -Rate controlled.  Eliquis held.  Continue digoxin and Cardizem  Leukocytosis -Probably reactive from high-dose steroid use.  Improving.  Repeat a.m. labs -WBC 11.7 today  Hypokalemia/hypomagnesemia -Replace K and mag, repeat in a.m.   History of lung cancer, breast cancer and colon cancer in remission -Outpatient follow-up with Dr. Julien Nordmann.  Generalized deconditioning -PT recommends SNF placement.  Social worker consulted -Overall  prognosis is guarded to poor.  If condition does not improve, consider hospice/comfort measures.  Palliative care following.     Code Status: DNR  Family Communication: patient , daughter over the phone, daughter is wondering the stroke could be related to her prior cancer, she requests oncology Dr Julien Nordmann  input on this  Disposition Plan: snf once medically stable   Consultants:  Pulmonary critical care  Palliative care  Urology  Procedures:  Right-sided thoracentesis on 10/31/2018 by PCCM  Antibiotics: Cefepime from 10/30/2018-11/04/2018  vancomycin 10/30/2018-10/31/2018   Objective: BP (!) 112/57 (BP Location: Left Arm)   Pulse (!) 55   Temp 98.2 F (36.8 C) (Oral)   Resp 18   Ht 5\' 6"  (1.676 m)   Wt 65.5 kg   LMP  (LMP Unknown)   SpO2 93%   BMI 23.31 kg/m   Intake/Output Summary (Last 24 hours) at 11/08/2018 1123 Last data filed at 11/08/2018 0923 Gross per 24 hour  Intake 440 ml  Output 750 ml  Net -310 ml   Filed Weights   11/06/18 0519 11/07/18 0532 11/08/18 0501  Weight: 66.5 kg 65.7 kg 65.5 kg    Exam: Patient is examined daily including today on 11/08/2018, exams remain the same as of yesterday except that has changed    General:  Frail, aaox3, but poor memory, h/o right mastectomy   Cardiovascular: IRRR  Respiratory: diminished at basis, especially on the right base  Abdomen: Soft/ND/NT, positive BS  Musculoskeletal: No Edema  Neuro: alert, oriented x3  Data Reviewed: Basic Metabolic Panel: Recent Labs  Lab 11/04/18 0350 11/05/18 0454 11/06/18 0445 11/07/18 0506 11/08/18 0510  NA 136 133* 137 138 140  K 3.8 4.1 3.6 3.9 3.6  CL 92* 93* 94* 90* 93*  CO2 28 29 34* 36* 37*  GLUCOSE 229* 316* 155* 198* 146*  BUN 24* 29* 28* 30* 23  CREATININE 1.12* 0.96 0.95 0.96 0.82  CALCIUM 8.5* 8.8* 9.1 9.0 8.8*  MG 2.0 1.9 2.0 1.9 1.8   Liver Function Tests: No results for input(s): AST, ALT, ALKPHOS, BILITOT, PROT, ALBUMIN in the last  168 hours. No results for input(s): LIPASE, AMYLASE in the last 168 hours. No results for input(s): AMMONIA in the last 168 hours. CBC: Recent Labs  Lab 11/04/18 0350 11/05/18 0454 11/06/18 0445 11/07/18 0506 11/08/18 0510  WBC 18.1* 14.8* 12.5* 13.2* 11.7*  HGB 11.4* 11.9* 12.0 12.2 12.1  HCT 36.0 36.4 37.7 38.1 38.2  MCV 93.8 88.3 94.0 93.4 93.4  PLT 450* 458* 434* 496* 507*   Cardiac Enzymes:   No results for input(s): CKTOTAL, CKMB, CKMBINDEX, TROPONINI in the last 168 hours. BNP (last 3 results) Recent Labs    08/24/18 1626  BNP 156.0*    ProBNP (last 3 results) Recent Labs    09/12/18 1130  PROBNP 175.0*    CBG: Recent Labs  Lab 11/07/18 1218 11/07/18 1640 11/07/18 2046 11/08/18 0427 11/08/18 0753  GLUCAP 224* 327* 234* 142* 117*    Recent Results (from the past 240 hour(s))  Blood Culture (routine x 2)     Status: None   Collection Time: 10/30/18  4:08 PM  Result Value Ref Range Status   Specimen Description   Final    BLOOD RIGHT ARM Performed at [facility] [address], [place], [place] [address]    Special Requests   Final    BOTTLES DRAWN AEROBIC AND  ANAEROBIC Blood Culture adequate volume Performed at [facility] [address], [place], [place] [address]    Culture   Final    NO GROWTH 5 DAYS Performed at [facility], [place] [address], [place], [place] [address]    Report Status 11/04/2018 FINAL  Final  Urine culture     Status: Abnormal   Collection Time: 10/30/18  5:26 PM  Result Value Ref Range Status   Specimen Description   Final    URINE, RANDOM Performed at [facility] [address], [place], [place] [address]    Special Requests   Final    NONE Performed at [facility], [place] [address] [place], [place] [address]    Culture (A)  Final    <10,000 COLONIES/mL INSIGNIFICANT GROWTH Performed at [facility] [address],  [place], [place] [address]    Report Status 11/01/2018 FINAL  Final  MRSA PCR Screening     Status: None   Collection Time: 10/30/18 11:55 PM  Result Value Ref Range Status   MRSA by PCR NEGATIVE NEGATIVE Final    Comment:        The GeneXpert MRSA Assay (FDA approved for NASAL specimens only), is one component of a comprehensive MRSA colonization surveillance program. It is not intended to diagnose MRSA infection nor to guide or monitor treatment for MRSA infections. Performed at [facility], [place] [address], [place], [place] [address]   Expectorated sputum assessment w rflx to resp cult     Status: None   Collection Time: 10/31/18  1:26 AM  Result Value Ref Range Status   Specimen Description SPUTUM  Final   Special Requests NONE  Final   Sputum evaluation   Final    THIS SPECIMEN IS ACCEPTABLE FOR SPUTUM CULTURE Performed at [facility], [place] [address] [place], [place] [address]    Report Status 10/31/2018 FINAL  Final  Culture, respiratory     Status: None   Collection Time: 10/31/18  1:26 AM  Result Value Ref Range Status   Specimen Description   Final    SPUTUM Performed at [facility] [address] [place], [place] [address]    Special Requests   Final    NONE Reflexed from [id number] Performed at [facility], [place] [address], [place], [place] [address]    Gram Stain   Final    ABUNDANT WBC PRESENT, PREDOMINANTLY PMN NO ORGANISMS SEEN    Culture   Final    RARE CANDIDA ALBICANS RARE HAEMOPHILUS INFLUENZAE BETA LACTAMASE NEGATIVE Performed at [facility], [place] [address] [place], [place] [address]    Report Status 11/02/2018 FINAL  Final  Blood Culture (routine x 2)     Status: None   Collection Time: 10/31/18  3:04 AM  Result Value Ref Range Status   Specimen Description   Final    BLOOD RIGHT HAND Performed at [facility], [place] [address] [place], [place] [address]      Special Requests   Final    AEROBIC BOTTLE ONLY Blood Culture adequate volume Performed at [facility] [address], [place], [place] [address]    Culture   Final    NO GROWTH 5 DAYS Performed at [facility], [place] [address], [place], [place] [address]    Report Status 11/05/2018 FINAL  Final  Body fluid culture  Status: None   Collection Time: 10/31/18 10:20 AM  Result Value Ref Range Status   Specimen Description   Final    PLEURAL RIGHT Performed at [facility] [address] [place], [place] [address]    Special Requests   Final    NONE Performed at [facility], [place] [address], [place], [place] [address]    Gram Stain   Final    WBC PRESENT, PREDOMINANTLY PMN NO ORGANISMS SEEN CYTOSPIN SMEAR    Culture   Final    NO GROWTH 3 DAYS Performed at [facility], [place] [address], [place], [place] [address]    Report Status 11/03/2018 FINAL  Final     Studies: No results found.  Scheduled Meds: . atorvastatin  40 mg Oral q1800  . budesonide (PULMICORT) nebulizer solution  0.25 mg Nebulization BID  . digoxin  0.125 mg Oral Daily  . diltiazem  240 mg Oral Daily  . ferrous sulfate  325 mg Oral QPM  . fesoterodine  4 mg Oral Daily  . furosemide  80 mg Oral BID  . insulin aspart  0-5 Units Subcutaneous QHS  . insulin aspart  0-9 Units Subcutaneous TID WC  . ipratropium-albuterol  3 mL Nebulization TID  . mouth rinse  15 mL Mouth Rinse BID  . mirtazapine  15 mg Oral QHS  . multivitamin with minerals  1 tablet Oral Daily  . pantoprazole  40 mg Oral Daily  . predniSONE  20 mg Oral Q breakfast  . sertraline  50 mg Oral Daily    Continuous Infusions:   Time spent: 8mins, case discussed with neurology On call Dr Leonel Ramsay  I have personally reviewed and interpreted on  11/08/2018 daily labs, tele strips, imagings as discussed above under date review session and assessment and plans.  I  reviewed all nursing notes, pharmacy notes, consultant notes,  vitals, pertinent old records  I have discussed plan of care as described above with RN , patient and family on 11/08/2018   Florencia Reasons MD, PhD  [facility] Pager [phone]. If 7PM-7AM, please contact night-coverage at [email or web address], password [facility] 11/08/2018, 11:23 AM  LOS: 9 days

## 2018-11-08 NOTE — Progress Notes (Signed)
Upon assessment of pt, RN noted 3 tissues with quarter-size amount of dark red blood mixed with light red blood on them. Pt coughed up one more while RN was in room with the same amount of dark red blood on it. RN notified on-call Schorr about this d/t restarting her Eliquis tonight at 2200. On-call advised this RN to refer to MD Xu's note from earlier today regarding the Eliquis. RN held tonight's dose of Eliquis d/t the tissues found. This RN will continue to monitor the pt closely.

## 2018-11-08 NOTE — Progress Notes (Signed)
Assumed patient care from [place], [place] at approximately [phone]. Agree with prior nursing assessment. Pt's orthostatic vital signs completed. Pt assisted back to bed. Tolerated well.

## 2018-11-08 NOTE — Care Management Important Message (Signed)
Important Message  Patient Details  Name: Tianni Escamilla MRN: [id number] Date of Birth: October 05, 1935   Medicare Important Message Given:  Yes    Kerin Salen 11/08/2018, 10:53 AMImportant Message  Patient Details  Name: Lamija Besse MRN: [id number] Date of Birth: 1934-12-22   Medicare Important Message Given:  Yes    Kerin Salen 11/08/2018, 10:53 AM

## 2018-11-09 ENCOUNTER — Inpatient Hospital Stay (HOSPITAL_COMMUNITY): Payer: Medicare HMO

## 2018-11-09 ENCOUNTER — Ambulatory Visit: Payer: Medicare HMO | Admitting: Internal Medicine

## 2018-11-09 DIAGNOSIS — I639 Cerebral infarction, unspecified: Secondary | ICD-10-CM

## 2018-11-09 LAB — LIPID PANEL
Cholesterol: 167 mg/dL (ref 0–200)
HDL: 42 mg/dL (ref >40)
LDL Cholesterol: 100 mg/dL — ABNORMAL HIGH (ref 0–99)
Total CHOL/HDL Ratio: 4 RATIO
Triglycerides: 123 mg/dL (ref <150)
VLDL: 25 mg/dL (ref 0–40)

## 2018-11-09 LAB — BASIC METABOLIC PANEL
Anion gap: 13 (ref 5–15)
BUN: 26 mg/dL — AB (ref 8–23)
CO2: 34 mmol/L — ABNORMAL HIGH (ref 22–32)
Calcium: 8.8 mg/dL — ABNORMAL LOW (ref 8.9–10.3)
Chloride: 93 mmol/L — ABNORMAL LOW (ref 98–111)
Creatinine, Ser: 0.93 mg/dL (ref 0.44–1.00)
GFR calc Af Amer: >60 mL/min (ref >60)
GFR calc non Af Amer: 58 mL/min — ABNORMAL LOW (ref >60)
Glucose, Bld: 154 mg/dL — ABNORMAL HIGH (ref 70–99)
Potassium: 3.7 mmol/L (ref 3.5–5.1)
Sodium: 140 mmol/L (ref 135–145)

## 2018-11-09 LAB — GLUCOSE, CAPILLARY
GLUCOSE-CAPILLARY: 197 mg/dL — AB (ref 70–99)
Glucose-Capillary: 127 mg/dL — ABNORMAL HIGH (ref 70–99)
Glucose-Capillary: 240 mg/dL — ABNORMAL HIGH (ref 70–99)
Glucose-Capillary: 180 mg/dL — ABNORMAL HIGH (ref 70–99)

## 2018-11-09 LAB — CBC
HCT: 39.7 % (ref 36.0–46.0)
Hemoglobin: 12.4 g/dL (ref 12.0–15.0)
MCH: 28.9 pg (ref 26.0–34.0)
MCHC: 31.2 g/dL (ref 30.0–36.0)
MCV: 92.5 fL (ref 80.0–100.0)
Platelets: 546 10*3/uL — ABNORMAL HIGH (ref 150–400)
RBC: 4.29 MIL/uL (ref 3.87–5.11)
RDW: 13.5 % (ref 11.5–15.5)
WBC: 12.8 10*3/uL — ABNORMAL HIGH (ref 4.0–10.5)
nRBC: 0 % (ref 0.0–0.2)

## 2018-11-09 LAB — MAGNESIUM: Magnesium: 1.7 mg/dL (ref 1.7–2.4)

## 2018-11-09 MED ORDER — MAGNESIUM SULFATE 2 GM/50ML IV SOLN
2.0000 g | Freq: Once | INTRAVENOUS | Status: AC
  Administered 2018-11-09: 2 g via INTRAVENOUS
  Filled 2018-11-09: qty 50

## 2018-11-09 MED ORDER — SODIUM CHLORIDE 0.9 % IV SOLN
INTRAVENOUS | Status: DC | PRN
  Administered 2018-11-09: 250 mL via INTRAVENOUS

## 2018-11-09 MED ORDER — POTASSIUM CHLORIDE CRYS ER 20 MEQ PO TBCR
40.0000 meq | EXTENDED_RELEASE_TABLET | Freq: Once | ORAL | Status: AC
  Administered 2018-11-09: 40 meq via ORAL
  Filled 2018-11-09: qty 2

## 2018-11-09 NOTE — Progress Notes (Signed)
This RN notified rapid response RN to complete NIH stroke score. Upon speaking to rapid RN, he said pt tested perfectly on the NIH. He recommended notifying on-call about changing neuro checks and vitals to q4h instead of q2h since her result was perfect for the NIH. This RN notified on-call Schorr about this and new orders were placed for neuro checks and vitals q4h. Will continue to monitor the pt closely.

## 2018-11-09 NOTE — Progress Notes (Signed)
PMT progress note  Chart reviewed, and discussed with [facility] MD Dr Erlinda Hong in detail about the patient's multiple serious life limiting illnesses, her recent diagnosis of infarcts, her lung cancer, A fib, recurrent pleural effusions.   Ongoing goals of care discussions.   PT evaluation pending Would still recommend SNF rehab with palliative care to follow the patient on discharge.   BP (!) 114/54 (BP Location: Left Arm)   Pulse 63   Temp 98.1 F (36.7 C) (Oral)   Resp (!) 40   Ht 5\' 6"  (1.676 m)   Wt 65.5 kg   LMP  (LMP Unknown)   SpO2 97%   BMI 23.31 kg/m  Labs and imaging noted She is in no distress how ever admits to feeling weak and dizzy Regular S 1 S 2 Abdomen is not distended Non focal Awake alert  No family at bedside  Patient states that she is feeing tired and would like to order some lunch and then rest up before PT comes in.   15 minutes spent PPS 40% Loistine Chance MD [facility] health palliative medicine team [phone]

## 2018-11-09 NOTE — Progress Notes (Signed)
PT to see patient at approximately 3 p.m. today - family to be present.

## 2018-11-09 NOTE — Progress Notes (Signed)
Physical Therapy Treatment Patient Details Name: Pamela Mcguire MRN: [id number] DOB: [birth year] Today's Date: 11/09/2018    History of Present Illness 83 y.o. female with past medical history significant for right lung adenocarcinoma status post radiation and chemotherapy in the past and now on surveillance and no active therapy, history of breast cancer status post right mastectomy and colon cancer in remission, COPD, L1 compression fx, afib and pt was found to be hypotensive, tachycardic and febrile in the ED.  CT scan of the chest and abdomen showed large right-sided pleural effusion    PT Comments    Patient with continued limitations in mobility due to SOB.  When cued for slower deeper breaths c/o pain on R side.  Continued need for SNF level rehab at d/c.  Slow progress with OOB this session and encouraged OOB daily.    Follow Up Recommendations  SNF     Equipment Recommendations  None recommended by PT    Recommendations for Other Services       Precautions / Restrictions Precautions Precautions: Fall Precaution Comments: chronic oxygen (2L O2 baseline)    Mobility  Bed Mobility Overal bed mobility: Needs Assistance Bed Mobility: Supine to Sit     Supine to sit: Min assist;HOB elevated     General bed mobility comments: light assist for trunk for OOB  Transfers Overall transfer level: Needs assistance Equipment used: Rolling walker (2 wheeled) Transfers: Sit to/from Stand Sit to Stand: Mod assist Stand pivot transfers: Min assist       General transfer comment: cues for hand placement, lifting assist from EOB, assist for balance to step to recliner, cues for slower breaths  Ambulation/Gait             General Gait Details: unable, pt tachypneic at EOB   Stairs             Wheelchair Mobility    Modified Rankin (Stroke Patients Only)       Balance Overall balance assessment: Needs assistance   Sitting balance-Leahy Scale: Good      Standing balance support: Bilateral upper extremity supported Standing balance-Leahy Scale: Poor                              Cognition Arousal/Alertness: Awake/alert Behavior During Therapy: WFL for tasks assessed/performed Overall Cognitive Status: Impaired/Different from baseline Area of Impairment: Safety/judgement;Problem solving;Following commands                       Following Commands: Follows one step commands with increased time Safety/Judgement: Decreased awareness of safety;Decreased awareness of deficits   Problem Solving: Slow processing;Decreased initiation        Exercises General Exercises - Lower Extremity Long Arc Quad: Strengthening;5 reps;Seated Hip Flexion/Marching: Strengthening;5 reps;Seated    General Comments General comments (skin integrity, edema, etc.): daughter in room during session, reports plan is for rehab prior to d/c home as she would be unable to make it to the bathroom      Pertinent Vitals/Pain Pain Assessment: Faces Faces Pain Scale: Hurts little more Pain Location: R periscapular area Pain Descriptors / Indicators: Sharp Pain Intervention(s): Monitored during session;Repositioned    Home Living                      Prior Function            PT Goals (current goals can now be found in the  care plan section) Progress towards PT goals: Progressing toward goals    Frequency    Min 2X/week      PT Plan Current plan remains appropriate    Co-evaluation              AM-PAC PT "6 Clicks" Mobility   Outcome Measure  Help needed turning from your back to your side while in a flat bed without using bedrails?: A Little Help needed moving from lying on your back to sitting on the side of a flat bed without using bedrails?: A Lot Help needed moving to and from a bed to a chair (including a wheelchair)?: A Little Help needed standing up from a chair using your arms (e.g., wheelchair or  bedside chair)?: A Lot Help needed to walk in hospital room?: Total Help needed climbing 3-5 steps with a railing? : Total 6 Click Score: 12    End of Session Equipment Utilized During Treatment: Gait belt;Oxygen Activity Tolerance: Patient limited by fatigue Patient left: with call bell/phone within reach;in chair;with family/visitor present   PT Visit Diagnosis: Other abnormalities of gait and mobility (R26.89);Muscle weakness (generalized) (M62.81)     Time: 4276-7011 PT Time Calculation (min) (ACUTE ONLY): 25 min  Charges:  $Therapeutic Exercise: 8-22 mins $Therapeutic Activity: 8-22 mins                     Magda Kiel, [place] [facility] [phone] 11/09/2018    Reginia Naas 11/09/2018, 5:47 PM

## 2018-11-09 NOTE — Progress Notes (Signed)
Bilateral carotid duplex exam completed.   Result discussed with Dr. Florencia Reasons. Please see preliminary notes on CV PROC under chart review.  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 Right ICA prox and mid are patent, however, with three different samples doppler attempts demonstrate occlusion at the distal segment.  Left Carotid: Velocities in the left ICA are consistent with a 1-39% stenosis.   Vertebrals:Bilateral vertebral arteries demonstrate antegrade flow.  Pamela Mcguire Pamela Mcguire(RDMS RVT) 11/09/18 11:10 AM

## 2018-11-09 NOTE — Progress Notes (Signed)
Discussed dc plan this morning with pt and daughter [name] again. Pt remains ambivalent re: desire post-acute care plan. Daughter interested in pursuing short term rehab with palliative following with goal of pt returning home to her apartment.  Provided daughter with SNF bed offers, once selection made, pt will need [facility] authorization for admission to SNF.  Sharren Bridge, MSW, LCSW Clinical Social Work 11/09/2018 [phone]

## 2018-11-09 NOTE — Progress Notes (Addendum)
PROGRESS NOTE  Pamela Mcguire ZOX:[id number] DOB: 1935-06-19 DOA: 10/30/2018 PCP: Hoyt Koch, MD  HPI/Recap of past 24 hours:  C/o feeling weak , report dizziness has resolved She has poor short term memory, she replies to most of the questions: "I can not remember" She is aaox3, no edema She is on 2liter oxygen supplement, denies chest pain, no cough noted during encounter But she still report streaks of blood in her sputum.  Son-in-law and daughter at bedside  Assessment/Plan: Principal Problem:   SIRS (systemic inflammatory response syndrome) (HCC) Active Problems:   Paroxysmal a-fib (HCC)   Essential hypertension   Severe obstructive sleep apnea   Adenocarcinoma of upper lobe of right lung   Breast cancer (HCC)   CAD (coronary artery disease)   COPD exacerbation (HCC)   Chronic respiratory failure with hypoxia ([place]), 2L home O2   Pleural effusion on right   Acute respiratory failure with hypoxia (HCC)    Dizziness: -Does not appear orthostatic -mri brain with "1. Approximately seven punctate acute infarcts are scattered in the right hemisphere involving the frontal, temporal, and occipital lobes. No associated hemorrhage or mass effect. 2. Underlying chronic small vessel disease and prior right PCA territory infarct. 3. By patient request the examination had to be discontinued prior to completion." -As discussed with neurology on-call Dr. 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         Right ICA prox and mid are patent, however, with three different         samples doppler attempts demonstrate occlusion at the distal         segment.   -Case discussed with neurology  on-call Dr. Cheral Marker the neuro interventionalists Dr. Franchot Gallo  -Patient appear to be high risk to going going through procedure, paced GEN baseline quality of life is already poor, will continue to discuss with family regarding how to proceed with this.  Family already expressed not wanting to put patient through more procedures  Sepsis/SIRS most likely secondary to pneumonia: Present on admission -Cultures negative .  Hemodynamically stable. -Sepsis has resolved  Acute hypoxic respiratory failure secondary to pneumonia/bilateral pleural effusion/pulmonary edema -Patient required BiPAP for little while on 10/31/2018.   -Respiratory status improving.  Currently on 3 L oxygen via nasal cannula.  PCCM following   bilateral pleural effusion status post right-sided thoracentesis and subsequent right-sided small pneumothorax -Patient had right-sided thoracentesis and 2 L fluid removed on 10/31/2018 and subsequently CT scan of the chest showed small right-sided pneumothorax and moderate left-sided pleural effusion along with consolidation on the left side -PCCM was following and had signed off.  PCCM was reconsulted on 11/06/2018 for recurrent right-sided pleural effusion: For now no need for repeat thoracentesis or Pleurx catheter as per PCCM.  Patient might need bronchoscopy at some point to rule out endobronchial lesions. -Lasix was increased to 80 mg IV twice a day on 11/06/2018.  Negative balance of 6413 cc since admission.  Change Lasix to 80 mg twice a day orally. -Patient is having intermittent hemoptysis.  Eliquis held on 11/06/2018 as per PCCM.  Community-acquired bacterial multilobar pneumonia -Cefepime was discontinued on 11/04/2018 by PCCM.  Cultures negative so far.    Strep pneumoniae antigen is negative  Acute on chronic diastolic CHF -EF in  September 2019 was 60 to 65%.  Diuretic plan as above.  Strict input and output.  Daily weights.  Outpatient follow-up with cardiology  Chest  pain with only minimally elevated troponin -Most likely secondary to above.  Management as above  COPD with mild exacerbation -Continue steroid tapering.  Taper off in the next 2 days.  Continue nebs and Pulmicort next  Paroxysmal A. fib -Rate controlled.  Eliquis held.  Continue digoxin and Cardizem  Leukocytosis -Probably reactive from high-dose steroid use.  Improving.  Repeat a.m. labs -WBC 11.7 today  Hypokalemia/hypomagnesemia -Replace K and mag, repeat in a.m.   History of lung cancer, breast cancer and colon cancer in remission -Outpatient follow-up with Dr. Julien Nordmann.  Generalized deconditioning -PT recommends SNF placement.  Social worker consulted -Overall prognosis is guarded to poor.  If condition does not improve, consider hospice/comfort measures.  Palliative care following.     Code Status: DNR  Family Communication: patient , daughter and son-in-law at bedside  Disposition Plan: snf with palliative care pending bed availability   Consultants:  Pulmonary critical care  Palliative care  Neurology  Neuro interventionalist  Oncology  Procedures:  Right-sided thoracentesis on 10/31/2018 by PCCM  Antibiotics: Cefepime from 10/30/2018-11/04/2018  vancomycin 10/30/2018-10/31/2018   Objective: BP (!) 113/58 (BP Location: Right Arm)   Pulse (!) 56   Temp 98.5 F (36.9 C) (Oral)   Resp (!) 40   Ht 5\' 6"  (1.676 m)   Wt 65.5 kg   LMP  (LMP Unknown)   SpO2 98%   BMI 23.31 kg/m   Intake/Output Summary (Last 24 hours) at 11/09/2018 1921 Last data filed at 11/09/2018 1428 Gross per 24 hour  Intake 380 ml  Output -  Net 380 ml   Filed Weights   11/06/18 0519 11/07/18 0532 11/08/18 0501  Weight: 66.5 kg 65.7 kg 65.5 kg    Exam: Patient is examined daily including today on 11/09/2018, exams remain the same as of yesterday except that has changed    General:  Frail, aaox3, but poor memory, h/o right mastectomy   Cardiovascular:  IRRR  Respiratory: diminished at basis, especially on the right base  Abdomen: Soft/ND/NT, positive BS  Musculoskeletal: No Edema  Neuro: alert, oriented x3  Data Reviewed: Basic Metabolic Panel: Recent Labs  Lab 11/05/18 0454 11/06/18 0445 11/07/18 0506 11/08/18 0510 11/09/18 0530  NA 133* 137 138 140 140  K 4.1 3.6 3.9 3.6 3.7  CL 93* 94* 90* 93* 93*  CO2 29 34* 36* 37* 34*  GLUCOSE 316* 155* 198* 146* 154*  BUN 29* 28* 30* 23 26*  CREATININE 0.96 0.95 0.96 0.82 0.93  CALCIUM 8.8* 9.1 9.0 8.8* 8.8*  MG 1.9 2.0 1.9 1.8 1.7   Liver Function Tests: No results for input(s): AST, ALT, ALKPHOS, BILITOT, PROT, ALBUMIN in the last 168 hours. No results for input(s): LIPASE, AMYLASE in the last 168 hours. No results for input(s): AMMONIA in the last 168 hours. CBC: Recent Labs  Lab 11/05/18 0454 11/06/18 0445 11/07/18 0506 11/08/18 0510 11/09/18 0530  WBC 14.8* 12.5* 13.2* 11.7* 12.8*  HGB 11.9* 12.0 12.2 12.1 12.4  HCT 36.4 37.7 38.1 38.2 39.7  MCV 88.3 94.0 93.4 93.4 92.5  PLT 458* 434* 496* 507* 546*   Cardiac Enzymes:   No results for input(s): CKTOTAL, CKMB, CKMBINDEX, TROPONINI in the last 168 hours. BNP (last 3 results) Recent Labs    08/24/18 1626  BNP 156.0*    ProBNP (last 3 results) Recent Labs  09/12/18 1130  PROBNP 175.0*    CBG: Recent Labs  Lab 11/08/18 1721 11/08/18 2141 11/09/18 0733 11/09/18 1157 11/09/18 1648  GLUCAP 255* 199* 127* 240* 197*    Recent Results (from the past 240 hour(s))  MRSA PCR Screening     Status: None   Collection Time: 10/30/18 11:55 PM  Result Value Ref Range Status   MRSA by PCR NEGATIVE NEGATIVE Final    Comment:        The GeneXpert MRSA Assay (FDA approved for NASAL specimens only), is one component of a comprehensive MRSA colonization surveillance program. It is not intended to diagnose MRSA infection nor to guide or monitor treatment for MRSA infections. Performed at [facility], [place] [address], [place], [place] [address]   Expectorated sputum assessment w rflx to resp cult     Status: None   Collection Time: 10/31/18  1:26 AM  Result Value Ref Range Status   Specimen Description SPUTUM  Final   Special Requests NONE  Final   Sputum evaluation   Final    THIS SPECIMEN IS ACCEPTABLE FOR SPUTUM CULTURE Performed at [facility], [place] [address], [place], [place] [address]    Report Status 10/31/2018 FINAL  Final  Culture, respiratory     Status: None   Collection Time: 10/31/18  1:26 AM  Result Value Ref Range Status   Specimen Description   Final    SPUTUM Performed at [place] [address], [place], [place] [address]    Special Requests   Final    NONE Reflexed from [id number] Performed at [facility], [place] [address] [place], [place] [address]    Gram Stain   Final    ABUNDANT WBC PRESENT, PREDOMINANTLY PMN NO ORGANISMS SEEN    Culture   Final    RARE CANDIDA ALBICANS RARE HAEMOPHILUS INFLUENZAE BETA LACTAMASE NEGATIVE Performed at [facility], [place] [address], [place], [place] [address]    Report Status 11/02/2018 FINAL  Final  Blood Culture (routine x 2)     Status: None   Collection Time: 10/31/18  3:04 AM  Result Value Ref Range Status   Specimen Description   Final    BLOOD RIGHT HAND Performed at [facility], [place] [address] [place], [place] [address]    Special Requests   Final    AEROBIC BOTTLE ONLY Blood Culture adequate volume Performed at [place] [address], [place], [place] [address]    Culture   Final    NO GROWTH 5 DAYS Performed at [facility], [place] [address], [place], [place] [address]    Report Status 11/05/2018 FINAL  Final  Body fluid culture     Status: None   Collection Time: 10/31/18 10:20 AM  Result Value Ref Range Status   Specimen Description   Final    PLEURAL RIGHT Performed at  [place] [address] [place], [place] [address]    Special Requests   Final    NONE Performed at [facility], [place] [address] [place], [place] [address]    Gram Stain   Final    WBC PRESENT, PREDOMINANTLY PMN NO ORGANISMS SEEN CYTOSPIN SMEAR    Culture   Final    NO GROWTH 3 DAYS Performed at [facility], [place] [address], [place], [place] [address]    Report Status 11/03/2018 FINAL  Final  Studies: Vas US Carotid (at [place] Only)  Result Date: 11/09/2018 Carotid Arterial Duplex Study Indications:       CVA. Risk Factors:      Hypertension. Comparison Study:  No prior study available Performing Technologist: Rudell Cobb  Examination Guidelines: A complete evaluation includes B-mode imaging, spectral Doppler, color Doppler, and power Doppler as needed of all accessible portions of each vessel. Bilateral testing is considered an integral part of a complete examination. Limited examinations for reoccurring indications may be performed as noted.  Right Carotid Findings: +----------+--------+--------+--------+-------------------+--------------------+           PSV cm/sEDV cm/sStenosisDescribe           Comments             +----------+--------+--------+--------+-------------------+--------------------+ CCA Prox  65      11                                                      +----------+--------+--------+--------+-------------------+--------------------+ CCA Distal58      13              hypoechoic and                                                            diffuse                                 +----------+--------+--------+--------+-------------------+--------------------+ ICA Prox  101     21      1-39%   focal, hyperechoic Shadowing                                              and calcific                             +----------+--------+--------+--------+-------------------+--------------------+ ICA Mid   77      27              hyperechoic and                                                           diffuse                                 +----------+--------+--------+--------+-------------------+--------------------+ ICA Distal                Occludedhypoechoic and     3 different attempts                                   diffuse            demonstrate possible  occlusion distally   +----------+--------+--------+--------+-------------------+--------------------+ ECA       62                                                              +----------+--------+--------+--------+-------------------+--------------------+ +----------+--------+-------+--------+-------------------+           PSV cm/sEDV cmsDescribeArm Pressure (mmHG) +----------+--------+-------+--------+-------------------+ Subclavian113                                        +----------+--------+-------+--------+-------------------+ +---------+--------+--+--------+--+---------+ VertebralPSV cm/s42EDV cm/s10Antegrade +---------+--------+--+--------+--+---------+  Left Carotid Findings: +----------+--------+--------+--------+------------------------------+---------+           PSV cm/sEDV cm/sStenosisDescribe                      Comments  +----------+--------+--------+--------+------------------------------+---------+ CCA Prox  107     17                                                      +----------+--------+--------+--------+------------------------------+---------+ CCA Distal76      14              diffuse and hypoechoic                  +----------+--------+--------+--------+------------------------------+---------+ ICA Prox  59      14      1-39%   focal, hyperechoic and        Shadowing                                    calcific                                +----------+--------+--------+--------+------------------------------+---------+ ICA Mid   69      20                                                      +----------+--------+--------+--------+------------------------------+---------+ ICA Distal74      14                                                      +----------+--------+--------+--------+------------------------------+---------+ ECA       123                                                             +----------+--------+--------+--------+------------------------------+---------+ +----------+--------+--------+--------+-------------------+ SubclavianPSV cm/sEDV cm/sDescribeArm Pressure (mmHG) +----------+--------+--------+--------+-------------------+           282                                         +----------+--------+--------+--------+-------------------+ +---------+--------+--+--------+--+---------+  VertebralPSV cm/s60EDV cm/s12Antegrade +---------+--------+--+--------+--+---------+  Summary: Right Carotid: 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                Right ICA prox and mid are patent, however, with three different                samples doppler attempts demonstrate occlusion at the distal                segment. Left Carotid: Velocities in the left ICA are consistent with a 1-39% stenosis. Vertebrals: Bilateral vertebral arteries demonstrate antegrade flow. *See table(s) above for measurements and observations.  Electronically signed by Deitra Mayo MD on 11/09/2018 at 1:52:03 PM.    Final     Scheduled Meds: .  stroke: mapping our early stages of recovery book   Does not apply Once  . apixaban  5 mg Oral BID  . atorvastatin  40 mg Oral q1800  . budesonide (PULMICORT) nebulizer solution  0.25 mg Nebulization BID  . digoxin  0.125 mg Oral Daily  . diltiazem  240 mg Oral Daily  . ferrous sulfate  325 mg Oral QPM   . fesoterodine  4 mg Oral Daily  . furosemide  80 mg Oral BID  . insulin aspart  0-5 Units Subcutaneous QHS  . insulin aspart  0-9 Units Subcutaneous TID WC  . ipratropium-albuterol  3 mL Nebulization TID  . mouth rinse  15 mL Mouth Rinse BID  . mirtazapine  15 mg Oral QHS  . multivitamin with minerals  1 tablet Oral Daily  . pantoprazole  40 mg Oral Daily  . predniSONE  20 mg Oral Q breakfast  . sertraline  50 mg Oral Daily    Continuous Infusions: . sodium chloride 250 mL (11/09/18 1138)     Time spent: 65mins, case discussed with palliative care Dr Geraldo Pitter,  neurology On call Dr Cheral Marker and Dr. Franchot Gallo I have personally reviewed and interpreted on  11/09/2018 daily labs,  imagings as discussed above under date review session and assessment and plans.  I reviewed all nursing notes, pharmacy notes, consultant notes,  vitals, pertinent old records  I have discussed plan of care as described above with RN , patient and family on 11/09/2018   Florencia Reasons MD, PhD  [facility] Pager [phone]. If 7PM-7AM, please contact night-coverage at [email or web address], password [facility] 11/09/2018, 7:21 PM  LOS: 10 days

## 2018-11-09 NOTE — NC FL2 (Signed)
[place] MEDICAID FL2 LEVEL OF CARE SCREENING TOOL     IDENTIFICATION  Patient Name: Pamela Mcguire Birthdate: [birth year] Sex: female Admission Date (Current Location): 10/30/2018  [facility] and [place] Number:  Herbalist and Address:  [facility],  [place] [address] [place]      Provider Number: [id number]  Attending Physician Name and Address:  Florencia Reasons, MD  Relative Name and Phone Number:       Current Level of Care: Hospital Recommended Level of Care: [place] Prior Approval Number:    Date Approved/Denied:   PASRR Number: [id number]  Discharge Plan: SNF    Current Diagnoses: Patient Active Problem List   Diagnosis Date Noted  . Acute respiratory failure with hypoxia ([place])   . Pleural effusion on right   . SIRS (systemic inflammatory response syndrome) ([place]) 10/30/2018  . Chronic respiratory failure with hypoxia ([place]), 2L home O2 08/29/2018  . SOB (shortness of breath) 08/29/2018  . Acute on chronic diastolic CHF (congestive heart failure) ([place]) 08/29/2018  . COPD exacerbation ([place]) 08/26/2017  . Pleural effusion transudative 08/22/2017  . Medicare annual wellness visit, subsequent 07/10/2017  . Encounter for general adult medical examination with abnormal findings 07/10/2017  . Insomnia due to medical condition 04/18/2017  . Blurred vision 04/18/2017  . Cancer of ascending colon s/p lap colectomy 02/08/2017 02/08/2017  . Iron deficiency anemia   . Fundic gland polyps of stomach, benign   . Fracture of L1 vertebra ([place]) 10/19/2015  . Weakness   . Compression fracture 09/13/2015  . Mobility impaired   . Fatigue 08/18/2015  . Major depression 08/18/2015  . Frequent stools 08/18/2015  . Tachypnea 06/22/2015  . Adenocarcinoma of upper lobe of right lung 04/30/2015  . Mediastinal adenopathy 04/24/2015  . Severe obstructive sleep apnea 03/27/2015  . Lung mass 03/27/2015  . Folate deficiency 09/21/2014  .  Paroxysmal a-fib ([place]) 05/27/2014  . Essential hypertension 05/27/2014  . Breast cancer ([place]) 04/17/2014  . Prediabetes 04/17/2014  . CAD (coronary artery disease) 04/16/2014  . Hyperlipidemia 04/16/2014    Orientation RESPIRATION BLADDER Height & Weight     Self, Time, Situation, Place  O2(2L) Continent Weight: 144 lb 6.4 oz (65.5 kg) Height:  5\' 6"  (167.6 cm)  BEHAVIORAL SYMPTOMS/MOOD NEUROLOGICAL BOWEL NUTRITION STATUS      Continent Diet(low sodium)  AMBULATORY STATUS COMMUNICATION OF NEEDS Skin   Extensive Assist Verbally Normal                       Personal Care Assistance Level of Assistance  Bathing, Feeding, Dressing Bathing Assistance: Maximum assistance Feeding assistance: Independent Dressing Assistance: Limited assistance     Functional Limitations Info  Sight, Hearing, Speech Sight Info: Adequate Hearing Info: Adequate Speech Info: Adequate    SPECIAL CARE FACTORS FREQUENCY  PT (By licensed PT), OT (By licensed OT)     PT Frequency: 5x OT Frequency: 5x            Contractures Contractures Info: Not present    Additional Factors Info  Code Status, Allergies Code Status Info: DNR Allergies Info: Influenza Vaccines, Eggs Or Egg-derived Products           Current Medications (11/09/2018):  This is the current hospital active medication list Current Facility-Administered Medications  Medication Dose Route Frequency Provider Last Rate Last Dose  .  stroke: mapping our early stages of recovery book   Does not apply Once Florencia Reasons, MD      .  0.9 %  sodium chloride infusion   Intravenous PRN Florencia Reasons, MD 10 mL/hr at 11/09/18 1138 250 mL at 11/09/18 1138  . acetaminophen (TYLENOL) tablet 650 mg  650 mg Oral Q6H PRN Rise Patience, MD       Or  . acetaminophen (TYLENOL) suppository 650 mg  650 mg Rectal Q6H PRN Rise Patience, MD      . apixaban Arne Cleveland) tablet 5 mg  5 mg Oral BID Florencia Reasons, MD   5 mg at 11/09/18 1110  . atorvastatin  (LIPITOR) tablet 40 mg  40 mg Oral q1800 Rise Patience, MD   40 mg at 11/08/18 1725  . budesonide (PULMICORT) nebulizer solution 0.25 mg  0.25 mg Nebulization BID Rise Patience, MD   0.25 mg at 11/09/18 8119  . digoxin (LANOXIN) tablet 0.125 mg  0.125 mg Oral Daily Rise Patience, MD   0.125 mg at 11/09/18 1111  . diltiazem (CARDIZEM CD) 24 hr capsule 240 mg  240 mg Oral Daily Rise Patience, MD   240 mg at 11/09/18 1110  . ferrous sulfate tablet 325 mg  325 mg Oral QPM Rise Patience, MD   325 mg at 11/08/18 1725  . fesoterodine (TOVIAZ) tablet 4 mg  4 mg Oral Daily Rise Patience, MD   4 mg at 11/09/18 1109  . furosemide (LASIX) tablet 80 mg  80 mg Oral BID Aline August, MD   80 mg at 11/09/18 0824  . guaiFENesin-dextromethorphan (ROBITUSSIN DM) 100-10 MG/5ML syrup 5 mL  5 mL Oral Q4H PRN [name], Kshitiz, MD   5 mL at 11/01/18 1312  . haloperidol lactate (HALDOL) injection 2.5 mg  2.5 mg Intravenous Q6H PRN Schorr, Rhetta Mura, NP      . insulin aspart (novoLOG) injection 0-5 Units  0-5 Units Subcutaneous QHS Aline August, MD   2 Units at 11/07/18 2202  . insulin aspart (novoLOG) injection 0-9 Units  0-9 Units Subcutaneous TID WC Aline August, MD   1 Units at 11/09/18 0825  . ipratropium-albuterol (DUONEB) 0.5-2.5 (3) MG/3ML nebulizer solution 3 mL  3 mL Nebulization Q2H PRN Rise Patience, MD   3 mL at 10/31/18 0720  . ipratropium-albuterol (DUONEB) 0.5-2.5 (3) MG/3ML nebulizer solution 3 mL  3 mL Nebulization TID Aline August, MD   3 mL at 11/09/18 0800  . magnesium sulfate IVPB 2 g 50 mL  2 g Intravenous Once Florencia Reasons, MD 50 mL/hr at 11/09/18 1139 2 g at 11/09/18 1139  . MEDLINE mouth rinse  15 mL Mouth Rinse BID Aline August, MD   15 mL at 11/08/18 0929  . mirtazapine (REMERON) tablet 15 mg  15 mg Oral QHS Rise Patience, MD   15 mg at 11/08/18 2144  . morphine 2 MG/ML injection 1-2 mg  1-2 mg Intravenous Q4H PRN Micheline Rough, MD   1  mg at 11/04/18 2244  . multivitamin with minerals tablet 1 tablet  1 tablet Oral Daily Rise Patience, MD   1 tablet at 11/09/18 1111  . ondansetron (ZOFRAN) tablet 4 mg  4 mg Oral Q6H PRN Rise Patience, MD   4 mg at 10/31/18 1237   Or  . ondansetron (ZOFRAN) injection 4 mg  4 mg Intravenous Q6H PRN Rise Patience, MD      . pantoprazole (PROTONIX) EC tablet 40 mg  40 mg Oral Daily Rise Patience, MD   40 mg at 11/09/18 1109  . predniSONE (DELTASONE)  tablet 20 mg  20 mg Oral Q breakfast Aline August, MD   20 mg at 11/09/18 0824  . sertraline (ZOLOFT) tablet 50 mg  50 mg Oral Daily Rise Patience, MD   50 mg at 11/09/18 1110  . sodium chloride flush (NS) 0.9 % injection 10-40 mL  10-40 mL Intracatheter PRN [name], Kshitiz, MD      . zolpidem (AMBIEN) tablet 5 mg  5 mg Oral QHS PRN Vertis Kelch, NP   5 mg at 11/08/18 2144     Discharge Medications: Please see discharge summary for a list of discharge medications.  Relevant Imaging Results:  Relevant Lab Results:   Additional Information SSN: [id number]. Needs outpatient palliative care to follow at facility  [facility], LCSW

## 2018-11-10 LAB — BASIC METABOLIC PANEL
Anion gap: 12 (ref 5–15)
BUN: 28 mg/dL — AB (ref 8–23)
CALCIUM: 8.7 mg/dL — AB (ref 8.9–10.3)
CO2: 36 mmol/L — ABNORMAL HIGH (ref 22–32)
CREATININE: 0.9 mg/dL (ref 0.44–1.00)
Chloride: 91 mmol/L — ABNORMAL LOW (ref 98–111)
GFR calc Af Amer: >60 mL/min (ref >60)
GFR calc non Af Amer: 60 mL/min — ABNORMAL LOW (ref >60)
Glucose, Bld: 137 mg/dL — ABNORMAL HIGH (ref 70–99)
Potassium: 3.8 mmol/L (ref 3.5–5.1)
Sodium: 139 mmol/L (ref 135–145)

## 2018-11-10 LAB — GLUCOSE, CAPILLARY
Glucose-Capillary: 120 mg/dL — ABNORMAL HIGH (ref 70–99)
Glucose-Capillary: 153 mg/dL — ABNORMAL HIGH (ref 70–99)
Glucose-Capillary: 194 mg/dL — ABNORMAL HIGH (ref 70–99)
Glucose-Capillary: 197 mg/dL — ABNORMAL HIGH (ref 70–99)

## 2018-11-10 LAB — MAGNESIUM: Magnesium: 2 mg/dL (ref 1.7–2.4)

## 2018-11-10 MED ORDER — PREDNISONE 10 MG PO TABS
10.0000 mg | ORAL_TABLET | Freq: Every day | ORAL | Status: DC
  Administered 2018-11-11 – 2018-11-12 (×2): 10 mg via ORAL
  Filled 2018-11-10 (×2): qty 1

## 2018-11-10 NOTE — Progress Notes (Signed)
PROGRESS NOTE  Pamela Mcguire JSH:[id number] DOB: Jul 11, 1935 DOA: 10/30/2018 PCP: Hoyt Koch, MD  HPI/Recap of past 24 hours:  No acute overnight event, reports no cough She is sitting up in bed   Assessment/Plan: Principal Problem:   SIRS (systemic inflammatory response syndrome) (HCC) Active Problems:   Paroxysmal a-fib (HCC)   Essential hypertension   Severe obstructive sleep apnea   Adenocarcinoma of upper lobe of right lung   Breast cancer (HCC)   CAD (coronary artery disease)   COPD exacerbation (HCC)   Chronic respiratory failure with hypoxia ([place]), 2L home O2   Pleural effusion on right   Acute respiratory failure with hypoxia ([place])  mri brain with "1. Approximately seven punctate acute infarcts are scattered in the right hemisphere involving the frontal, temporal, and occipital lobes. No associated hemorrhage or mass effect. 2. Underlying chronic small vessel disease and prior right PCA territory infarct. 3. By patient request the examination had to be discontinued prior to completion."  Resume eliquis  Possible occlusion of the right ICA distally on carotid US: -Case discussed with neurology on-call Dr. Cheral Marker the neuro interventionalists Dr. Franchot Gallo  -Patient appear to be high risk to going going through procedure -Family already expressed not wanting to put patient through more procedures  /SIRS most likely secondary to pneumonia: Present on admission -Cultures negative . Hemodynamically stable. -Sepsishasresolved  Acute hypoxic respiratory failure secondary to pneumonia/bilateral pleural effusion/pulmonary edema -Patient required BiPAP for little while on 10/31/2018.  -Respiratory status improving. Currently on 3L oxygen via nasal cannula. PCCM following   bilateral pleural effusion status post right-sided thoracentesis and subsequent right-sided small pneumothorax -Patient had right-sided thoracentesis and 2 L fluid removed on  10/31/2018 and subsequently CT scan of the chest showed small right-sided pneumothorax and moderate left-sided pleural effusion along with consolidation on the left side -PCCM was following and hadsigned off. PCCM was reconsulted on 11/06/2018 for recurrent right-sided pleural effusion: For now no need for repeat thoracentesis or Pleurx catheter as per PCCM. Patient might need bronchoscopy at some point to rule out endobronchial lesions. -Lasix was increased to 80 mg IV twice a day on 11/06/2018. Negative balance of 6413 cc since admission. Change Lasix to 80 mg twice a day orally. -Patient is having intermittent hemoptysis. Eliquis held on 11/06/2018 as per PCCM.  Community-acquired bacterial multilobar pneumonia -Cefepime was discontinued on 11/04/2018 by PCCM. Cultures negative so far. Strep pneumoniae antigen is negative  Acute on chronic diastolic CHF -EF in September 2019 was 60 to 65%.Diuretic plan as above. Strict input and output. Daily weights. Outpatient follow-up with cardiology  Chest pain with only minimally elevated troponin -Most likely secondary to above. Management as above  COPD with mild exacerbation -Continue steroid tapering. Taper off in the next 2 days. Continue nebs and Pulmicort next  Paroxysmal A. fib -Rate controlled. Eliquis held. Continue digoxin and Cardizem  Leukocytosis -Probably reactive from high-dose steroid use. Improving. Repeat a.m. labs -WBC 11.7 today  Hypokalemia/hypomagnesemia -Replace K and mag, repeat in a.m.   History of lung cancer, breast cancer and colon cancer in remission -Outpatient follow-up with Dr. Julien Nordmann.  Generalized deconditioning -PT recommends SNF placement. Social worker consulted -Overall prognosis is guarded to poor. If condition does not improve, consider hospice/comfort measures.Palliative care following.     Code Status: DNR  Family Communication: patient Disposition  Plan: snf with palliative care pending bed availability   Consultants:  Pulmonary critical care  Palliative care  Neurology  Neuro interventionalist  Oncology  Procedures:  Right-sided thoracentesis on 10/31/2018 by PCCM  Antibiotics: Cefepime from 10/30/2018-11/04/2018  vancomycin 10/30/2018-10/31/2018      Objective: BP (!) 114/51 (BP Location: Left Arm)   Pulse 69   Temp 98.3 F (36.8 C) (Oral)   Resp (!) 28   Ht 5\' 6"  (1.676 m)   Wt 65.5 kg   LMP  (LMP Unknown)   SpO2 94%   BMI 23.31 kg/m   Intake/Output Summary (Last 24 hours) at 11/10/2018 1334 Last data filed at 11/10/2018 3546 Gross per 24 hour  Intake 650 ml  Output 800 ml  Net -150 ml   Filed Weights   11/06/18 0519 11/07/18 0532 11/08/18 0501  Weight: 66.5 kg 65.7 kg 65.5 kg    Exam: Patient is examined daily including today on 11/10/2018, exams remain the same as of yesterday except that has changed    General:  Frail, aaox3, but poor memory, h/o right mastectomy   Cardiovascular: IRRR  Respiratory: diminished at basis, especially on the right base  Abdomen: Soft/ND/NT, positive BS  Musculoskeletal: No Edema  Neuro: alert, oriented x3   Data Reviewed: Basic Metabolic Panel: Recent Labs  Lab 11/06/18 0445 11/07/18 0506 11/08/18 0510 11/09/18 0530 11/10/18 0441  NA 137 138 140 140 139  K 3.6 3.9 3.6 3.7 3.8  CL 94* 90* 93* 93* 91*  CO2 34* 36* 37* 34* 36*  GLUCOSE 155* 198* 146* 154* 137*  BUN 28* 30* 23 26* 28*  CREATININE 0.95 0.96 0.82 0.93 0.90  CALCIUM 9.1 9.0 8.8* 8.8* 8.7*  MG 2.0 1.9 1.8 1.7 2.0   Liver Function Tests: No results for input(s): AST, ALT, ALKPHOS, BILITOT, PROT, ALBUMIN in the last 168 hours. No results for input(s): LIPASE, AMYLASE in the last 168 hours. No results for input(s): AMMONIA in the last 168 hours. CBC: Recent Labs  Lab 11/05/18 0454 11/06/18 0445 11/07/18 0506 11/08/18 0510 11/09/18 0530  WBC 14.8* 12.5* 13.2* 11.7* 12.8*    HGB 11.9* 12.0 12.2 12.1 12.4  HCT 36.4 37.7 38.1 38.2 39.7  MCV 88.3 94.0 93.4 93.4 92.5  PLT 458* 434* 496* 507* 546*   Cardiac Enzymes:   No results for input(s): CKTOTAL, CKMB, CKMBINDEX, TROPONINI in the last 168 hours. BNP (last 3 results) Recent Labs    08/24/18 1626  BNP 156.0*    ProBNP (last 3 results) Recent Labs    09/12/18 1130  PROBNP 175.0*    CBG: Recent Labs  Lab 11/09/18 1157 11/09/18 1648 11/09/18 2123 11/10/18 0800 11/10/18 1154  GLUCAP 240* 197* 180* 120* 153*    No results found for this or any previous visit (from the past 240 hour(s)).   Studies: No results found.  Scheduled Meds: .  stroke: mapping our early stages of recovery book   Does not apply Once  . apixaban  5 mg Oral BID  . atorvastatin  40 mg Oral q1800  . budesonide (PULMICORT) nebulizer solution  0.25 mg Nebulization BID  . digoxin  0.125 mg Oral Daily  . diltiazem  240 mg Oral Daily  . ferrous sulfate  325 mg Oral QPM  . fesoterodine  4 mg Oral Daily  . furosemide  80 mg Oral BID  . insulin aspart  0-5 Units Subcutaneous QHS  . insulin aspart  0-9 Units Subcutaneous TID WC  . ipratropium-albuterol  3 mL Nebulization TID  . mouth rinse  15 mL Mouth Rinse BID  . mirtazapine  15 mg Oral QHS  . multivitamin with  minerals  1 tablet Oral Daily  . pantoprazole  40 mg Oral Daily  . [START ON 11/11/2018] predniSONE  10 mg Oral Q breakfast  . sertraline  50 mg Oral Daily    Continuous Infusions: . sodium chloride 250 mL (11/09/18 1138)     Time spent: 61mins I have personally reviewed and interpreted on  11/10/2018 daily labs, imagings as discussed above under date review session and assessment and plans.  I reviewed all nursing notes, pharmacy notes, consultant notes,  vitals, pertinent old records  I have discussed plan of care as described above with RN , patient  on 11/10/2018   Florencia Reasons MD, PhD  [facility] Pager [phone]. If 7PM-7AM, please contact  night-coverage at [email or web address], password [email or web address] 11/10/2018, 1:34 PM  LOS: 11 days

## 2018-11-11 LAB — BASIC METABOLIC PANEL
Anion gap: 10 (ref 5–15)
BUN: 27 mg/dL — ABNORMAL HIGH (ref 8–23)
CO2: 36 mmol/L — AB (ref 22–32)
Calcium: 8.8 mg/dL — ABNORMAL LOW (ref 8.9–10.3)
Chloride: 94 mmol/L — ABNORMAL LOW (ref 98–111)
Creatinine, Ser: 0.97 mg/dL (ref 0.44–1.00)
GFR calc Af Amer: >60 mL/min (ref >60)
GFR calc non Af Amer: 55 mL/min — ABNORMAL LOW (ref >60)
Glucose, Bld: 149 mg/dL — ABNORMAL HIGH (ref 70–99)
Potassium: 3.6 mmol/L (ref 3.5–5.1)
Sodium: 140 mmol/L (ref 135–145)

## 2018-11-11 LAB — CBC WITH DIFFERENTIAL/PLATELET
Abs Immature Granulocytes: 0.11 10*3/uL — ABNORMAL HIGH (ref 0.00–0.07)
Basophils Absolute: 0 10*3/uL (ref 0.0–0.1)
Basophils Relative: 0 %
Eosinophils Absolute: 0 10*3/uL (ref 0.0–0.5)
Eosinophils Relative: 0 %
HCT: 38.3 % (ref 36.0–46.0)
HEMOGLOBIN: 12.2 g/dL (ref 12.0–15.0)
Immature Granulocytes: 1 %
LYMPHS PCT: 7 %
Lymphs Abs: 0.8 10*3/uL (ref 0.7–4.0)
MCH: 29.3 pg (ref 26.0–34.0)
MCHC: 31.9 g/dL (ref 30.0–36.0)
MCV: 91.8 fL (ref 80.0–100.0)
Monocytes Absolute: 0.6 10*3/uL (ref 0.1–1.0)
Monocytes Relative: 5 %
Neutro Abs: 10.2 10*3/uL — ABNORMAL HIGH (ref 1.7–7.7)
Neutrophils Relative %: 87 %
Platelets: 510 10*3/uL — ABNORMAL HIGH (ref 150–400)
RBC: 4.17 MIL/uL (ref 3.87–5.11)
RDW: 13.2 % (ref 11.5–15.5)
WBC: 11.6 10*3/uL — ABNORMAL HIGH (ref 4.0–10.5)
nRBC: 0 % (ref 0.0–0.2)

## 2018-11-11 LAB — GLUCOSE, CAPILLARY
Glucose-Capillary: 118 mg/dL — ABNORMAL HIGH (ref 70–99)
Glucose-Capillary: 152 mg/dL — ABNORMAL HIGH (ref 70–99)

## 2018-11-11 LAB — MAGNESIUM: Magnesium: 2 mg/dL (ref 1.7–2.4)

## 2018-11-11 MED ORDER — DILTIAZEM HCL ER COATED BEADS 180 MG PO CP24
180.0000 mg | ORAL_CAPSULE | Freq: Every day | ORAL | Status: DC
  Administered 2018-11-12: 180 mg via ORAL
  Filled 2018-11-11: qty 1

## 2018-11-11 MED ORDER — POTASSIUM CHLORIDE CRYS ER 20 MEQ PO TBCR
40.0000 meq | EXTENDED_RELEASE_TABLET | Freq: Once | ORAL | Status: AC
  Administered 2018-11-11: 40 meq via ORAL
  Filled 2018-11-11: qty 2

## 2018-11-11 MED ORDER — SODIUM CHLORIDE 0.9 % IV SOLN: INTRAVENOUS | Status: DC

## 2018-11-11 NOTE — Progress Notes (Signed)
PROGRESS NOTE  Pamela Mcguire [id number] DOB: 04-Sep-1935 DOA: 10/30/2018 PCP: Pamela Koch, MD  Brief Summary:  Per ED intake on 12/3" Per EMS, pt from [facility] complains of chest pain and shortness of breath since yesterday morning. Pt reports productive cough. 12 lead showed a-fib, pt has hx of same. Pt A&O."  Per admitting MD Pamela Mcguire on 12/3 :  HPI: Pamela Mcguire is a 83 y.o. female with history of diastolic CHF, atrial fibrillation, hyperlipidemia, sleep apnea, COPD presents to the ER because of worsening shortness of breath chest pain.  Patient states she has been having shortness of breath and chest pain for last few days which is acutely worsened.  Had one episode of nausea vomiting following which her symptoms worsen.  Chest pain is retrosternal nonradiating present even at rest.  Has been having some productive cough.  ED Course: In the ER patient was hypotensive tachycardic febrile with fever of 102 F.  Patient was started on sepsis protocol fluid bolus and started on empiric antibiotics.  Patient had CT scan of the chest and abdomen which showed large right-sided pleural effusion.  Blood cultures were obtained.  UA was unremarkable.  And admitted for SIRS source not clear.  Patient received fluids resuscitation in the ED and IV abx, sob worsened, repeat cxr on 12/4 "Large right pleural effusion with a essentially complete collapse of the right lung. Significant progression of right pleural effusion since yesterday" , critical care consulted, s/p bedside thoracentesis on 12/4 with 2 liters of exudative appearing fluid obtained. , repeat cxr post procedure "1. Substantially reduced right pleural effusion and improved right lung ventilation following thoracentesis. There is a trace right apical pneumothorax"    HPI/Recap of past 24 hours:  No acute overnight event, minimal cough, sputum is not bloody  She is sitting up in bed, pleasant, denies pain, no  fever  BP low normal, decrease cardizem dose with holding parameters   Assessment/Plan: Principal Problem:   SIRS (systemic inflammatory response syndrome) (HCC) Active Problems:   Paroxysmal a-fib (HCC)   Essential hypertension   Severe obstructive sleep apnea   Adenocarcinoma of upper lobe of right lung   Breast cancer (HCC)   CAD (coronary artery disease)   COPD exacerbation (HCC)   Chronic respiratory failure with hypoxia ([place]), 2L home O2   Pleural effusion on right   Acute respiratory failure with hypoxia ([place])   on 12/12 " mri brain with "1. Approximately seven punctate acute infarcts are scattered in the right hemisphere involving the frontal, temporal, and occipital lobes. No associated hemorrhage or mass effect. 2. Underlying chronic small vessel disease and prior right PCA territory infarct. 3. By patient request the examination had to be discontinued prior to completion."  Resume eliquis Avoid hypotention ( decrease cardizem from 240mg  daily to 180mg  daily with holding parameters)  Possible occlusion of the right ICA distally on carotid US: -Case discussed with neurology on-call Pamela Mcguire the neuro interventionalists Pamela Mcguire  -Patient appear to be high risk to going going through procedure -Family already expressed not wanting to put patient through more procedures  Sepsis/Community-acquired bacterial multilobar pneumonia Present on admission -resp culture 12/4: >>>rare hemophilus, blood culture negative, mrsa screening negative -Cefepime was discontinued on 11/04/2018 by PCCM.  -Sepsishasresolved  Acute on chronic hypoxic respiratory failure secondary to pneumonia/bilateral pleural effusions/pulmonary edema/acute on chronic diastolic chf -EF in September 2019 was 60 to 65%. -Patient required BiPAP for little while on 10/31/2018.  -Respiratory status improving. Currently  on 2L oxygen via nasal cannula (back to baseline).  -pulmonology /cricital  care consulted, now signed off.  -continue lasix 80mg  bid, prior to hospitalization on lasix 40mg  daily ( family reports previously noncompliance with lasix) -patient has wheezing initially, "spirometry from 2018 reviewed, no evidence of significant airway obstruction, FVC was 70% -concern wheezing is "cardiac asthma" / volume overload not bronchospasm " per pulmonlogy -wheezing resolved, o2 now back to baseline -Outpatient follow-up with cardiology Pamela Mcguire  Recurrent Bilateral pleural effusions right > left   -h/o recurrent bilateral pleural effusion, required thoracentesis in 07/2017, 05/2018, 07/2018 and on 12/4 during this hospitalization. Cytology has all been negative. Fluids culture no growth - status post right-sided thoracentesis 2 L fluid removed on 10/31/2018 and subsequent right-sided small pneumothorax -PCCM was reconsulted on 11/06/2018 for recurrent right-sided pleural effusion: For now no need for repeat thoracentesis or Pleurx catheter as per PCCM. Consider outpatient PET scan and if she gains strength we can perform a bronchoscopy per pulmonology -Lasix was increased to 80 mg  twice a day on 11/06/2018.  -Patient is having intermittent hemoptysis. Eliquis held on 11/06/2018 as per PCCM. Resumed on 12/12 due to acute cva -no significant hemoptysis, hgb stable -F/u with pulmonology Pamela Mcguire    Chest pain with only minimally elevated troponin -Most likely secondary to above. Management as above   Paroxysmal A. fib -Rate controlled.  - Continue digoxin and Cardizem ( dose reduced due to bp low normal) -epixaban held briefly due to hemoptysis, resumed due to acute punctate CVA on mri obtained on 12/12  Leukocytosis -Probably reactive from high-dose steroid use. Improving.  -Wean steroids  Hypokalemia/hypomagnesemia -Replace K and mag to keep k>4, mag >2  Thoracolumbar spondylosis with chronic moderate compression deformity of L1 incidental finding on CT    History of breast cancer,status post right mastectomy , in remission H/o stage 2 colon cancer s/p LAPAROSCOPIC PARTIAL COLECTOMY in 01/2017, in remission Stage IIIA (T1a, N2, M0) adenocarcinoma of the lung  presented with right upper lobe lung mass and mediastinal lymphadenopathy diagnosed in May 2016.  status post radiation and chemotherapy with partial response and she was not considered a good candidate for consolidation chemotherapy  and now on surveillance and no active therapy. -Outpatient follow-up with Pamela. Julien Nordmann.  OSA -not on therapy, can not wear mask P:  O2 to maintain saturations > 90%  Dementia Poor memory , but aaox3  Generalized deconditioning -PT recommends SNF placement. Social worker consulted -Overall prognosis is guarded. If condition does not improve, consider hospice/comfort measures.Palliative care following.     Code Status: DNR  Family Communication: patient Disposition Plan: snf with palliative care pending bed availability   Consultants:  Pulmonary critical care  Palliative care  Neurology  Neuro interventionalist  Oncology  Procedures:  Right-sided thoracentesis on 10/31/2018 by PCCM  Antibiotics: Cefepime from 10/30/2018-11/04/2018  Flagyl on 12/4 vancomycin 10/30/2018-10/31/2018  micro data: BCX 12/4> ng resp culture 12/4: >>>rare hemophilus Pleural fluid 12/4>>>ng Strep antigen 12/5: neg Legionella antigen 12/5>>>neg   Objective: BP (!) 105/59   Pulse 65   Temp (!) 97.5 F (36.4 C) (Oral)   Resp 18   Ht 5\' 6"  (1.676 m)   Wt 65.1 kg   LMP  (LMP Unknown)   SpO2 95%   BMI 23.16 kg/m   Intake/Output Summary (Last 24 hours) at 11/11/2018 0920 Last data filed at 11/11/2018 0449 Gross per 24 hour  Intake 570 ml  Output 500 ml  Net 70 ml  Filed Weights   11/07/18 0532 11/08/18 0501 11/11/18 0601  Weight: 65.7 kg 65.5 kg 65.1 kg    Exam: Patient is examined daily including today on 11/11/2018,  exams remain the same as of yesterday except that has changed    General:  Frail, aaox3, but poor memory, h/o right mastectomy   Cardiovascular: IRRR  Respiratory: diminished at basis, especially on the right base, no wheezing  Abdomen: Soft/ND/NT, positive BS  Musculoskeletal: No Edema  Neuro: alert, oriented x3   Data Reviewed: Basic Metabolic Panel: Recent Labs  Lab 11/07/18 0506 11/08/18 0510 11/09/18 0530 11/10/18 0441 11/11/18 0528  NA 138 140 140 139 140  K 3.9 3.6 3.7 3.8 3.6  CL 90* 93* 93* 91* 94*  CO2 36* 37* 34* 36* 36*  GLUCOSE 198* 146* 154* 137* 149*  BUN 30* 23 26* 28* 27*  CREATININE 0.96 0.82 0.93 0.90 0.97  CALCIUM 9.0 8.8* 8.8* 8.7* 8.8*  MG 1.9 1.8 1.7 2.0 2.0   Liver Function Tests: No results for input(s): AST, ALT, ALKPHOS, BILITOT, PROT, ALBUMIN in the last 168 hours. No results for input(s): LIPASE, AMYLASE in the last 168 hours. No results for input(s): AMMONIA in the last 168 hours. CBC: Recent Labs  Lab 11/06/18 0445 11/07/18 0506 11/08/18 0510 11/09/18 0530 11/11/18 0528  WBC 12.5* 13.2* 11.7* 12.8* 11.6*  NEUTROABS  --   --   --   --  10.2*  HGB 12.0 12.2 12.1 12.4 12.2  HCT 37.7 38.1 38.2 39.7 38.3  MCV 94.0 93.4 93.4 92.5 91.8  PLT 434* 496* 507* 546* 510*   Cardiac Enzymes:   No results for input(s): CKTOTAL, CKMB, CKMBINDEX, TROPONINI in the last 168 hours. BNP (last 3 results) Recent Labs    08/24/18 1626  BNP 156.0*    ProBNP (last 3 results) Recent Labs    09/12/18 1130  PROBNP 175.0*    CBG: Recent Labs  Lab 11/10/18 0800 11/10/18 1154 11/10/18 1641 11/10/18 2302 11/11/18 0813  GLUCAP 120* 153* 194* 197* 118*    No results found for this or any previous visit (from the past 240 hour(s)).   Studies: No results found.  Scheduled Meds: .  stroke: mapping our early stages of recovery book   Does not apply Once  . apixaban  5 mg Oral BID  . atorvastatin  40 mg Oral q1800  . budesonide  (PULMICORT) nebulizer solution  0.25 mg Nebulization BID  . digoxin  0.125 mg Oral Daily  . [START ON 11/12/2018] diltiazem  180 mg Oral Daily  . ferrous sulfate  325 mg Oral QPM  . fesoterodine  4 mg Oral Daily  . furosemide  80 mg Oral BID  . insulin aspart  0-5 Units Subcutaneous QHS  . insulin aspart  0-9 Units Subcutaneous TID WC  . ipratropium-albuterol  3 mL Nebulization TID  . mouth rinse  15 mL Mouth Rinse BID  . mirtazapine  15 mg Oral QHS  . multivitamin with minerals  1 tablet Oral Daily  . pantoprazole  40 mg Oral Daily  . predniSONE  10 mg Oral Q breakfast  . sertraline  50 mg Oral Daily    Continuous Infusions: . sodium chloride 250 mL (11/09/18 1138)     Time spent: 38mins I have personally reviewed and interpreted on  11/11/2018 daily labs, imagings as discussed above under date review session and assessment and plans.  I reviewed all nursing notes, pharmacy notes, consultant notes,  vitals, pertinent old  records  I have discussed plan of care as described above with RN , patient  on 11/11/2018   Florencia Reasons MD, PhD  [facility] Pager [phone]. If 7PM-7AM, please contact night-coverage at [email or web address], password [facility] 11/11/2018, 9:20 AM  LOS: 12 days

## 2018-11-11 NOTE — Progress Notes (Signed)
Patient's BP in the 90s and Cardizem due. Dr. Erlinda Hong notified, dose and parameter changed. Held this AM dose; will continue to assess patient.

## 2018-11-12 DIAGNOSIS — C3491 Malignant neoplasm of unspecified part of right bronchus or lung: Secondary | ICD-10-CM

## 2018-11-12 DIAGNOSIS — R6520 Severe sepsis without septic shock: Secondary | ICD-10-CM

## 2018-11-12 DIAGNOSIS — I639 Cerebral infarction, unspecified: Secondary | ICD-10-CM

## 2018-11-12 LAB — BASIC METABOLIC PANEL
Anion gap: 10 (ref 5–15)
BUN: 23 mg/dL (ref 8–23)
CO2: 35 mmol/L — ABNORMAL HIGH (ref 22–32)
Calcium: 9 mg/dL (ref 8.9–10.3)
Chloride: 94 mmol/L — ABNORMAL LOW (ref 98–111)
Creatinine, Ser: 0.86 mg/dL (ref 0.44–1.00)
GFR calc Af Amer: >60 mL/min (ref >60)
GFR calc non Af Amer: >60 mL/min (ref >60)
Glucose, Bld: 138 mg/dL — ABNORMAL HIGH (ref 70–99)
Potassium: 3.6 mmol/L (ref 3.5–5.1)
Sodium: 139 mmol/L (ref 135–145)

## 2018-11-12 LAB — GLUCOSE, CAPILLARY
GLUCOSE-CAPILLARY: 216 mg/dL — AB (ref 70–99)
Glucose-Capillary: 206 mg/dL — ABNORMAL HIGH (ref 70–99)
Glucose-Capillary: 124 mg/dL — ABNORMAL HIGH (ref 70–99)
Glucose-Capillary: 193 mg/dL — ABNORMAL HIGH (ref 70–99)
Glucose-Capillary: 180 mg/dL — ABNORMAL HIGH (ref 70–99)

## 2018-11-12 MED ORDER — FUROSEMIDE 80 MG PO TABS: 80.0000 mg | ORAL_TABLET | Freq: Two times a day (BID) | ORAL | 0 refills | Status: DC

## 2018-11-12 MED ORDER — DILTIAZEM HCL ER COATED BEADS 120 MG PO CP24: 120.0000 mg | ORAL_CAPSULE | Freq: Every day | ORAL | 0 refills | Status: DC

## 2018-11-12 MED ORDER — ACETAMINOPHEN 325 MG PO TABS: 650.0000 mg | ORAL_TABLET | Freq: Four times a day (QID) | ORAL | 0 refills | Status: AC | PRN

## 2018-11-12 MED ORDER — GUAIFENESIN ER 600 MG PO TB12: 600.0000 mg | ORAL_TABLET | Freq: Two times a day (BID) | ORAL | 2 refills | Status: AC

## 2018-11-12 MED ORDER — MAGNESIUM GLUCONATE 30 MG PO TABS: 30.0000 mg | ORAL_TABLET | Freq: Every day | ORAL | 0 refills | Status: AC

## 2018-11-12 MED ORDER — IPRATROPIUM-ALBUTEROL 0.5-2.5 (3) MG/3ML IN SOLN: 3.0000 mL | Freq: Two times a day (BID) | RESPIRATORY_TRACT | Status: DC

## 2018-11-12 MED ORDER — POTASSIUM CHLORIDE CRYS ER 20 MEQ PO TBCR
40.0000 meq | EXTENDED_RELEASE_TABLET | Freq: Once | ORAL | Status: AC
  Administered 2018-11-12: 40 meq via ORAL
  Filled 2018-11-12: qty 2

## 2018-11-12 MED ORDER — POTASSIUM CHLORIDE CRYS ER 20 MEQ PO TBCR: 40.0000 meq | EXTENDED_RELEASE_TABLET | ORAL | 0 refills | Status: DC

## 2018-11-12 NOTE — Discharge Summary (Signed)
Discharge Summary  Pamela Mcguire GNF:[id number] DOB: [birth year]  PCP: Hoyt Koch, MD  Admit date: 10/30/2018 Discharge date: 11/12/2018  Time spent: 9mins, more than 50% time spent on coordination of care.  Recommendations for Outpatient Follow-up:  1. F/u with SNF MD within a week  for hospital discharge follow up, repeat cbc/bmp at follow up. 2. F/u with neurointerventional radiology Dr Estanislado Pandy in two weeks 3. F/u with pulmonology Dr Elsworth Soho for recurrent right side pleural effusion to discuss pleurx/bronchoscopy/PET scan 4. F/u with oncology Dr Julien Nordmann as needed 5. Palliative care to continue follow with patient at SNF  Discharge Diagnoses:  Active Hospital Problems   Diagnosis Date Noted  . SIRS (systemic inflammatory response syndrome) ([place]) 10/30/2018  . Acute respiratory failure with hypoxia ([place])   . Pleural effusion on right   . Chronic respiratory failure with hypoxia ([place]), 2L home O2 08/29/2018  . COPD exacerbation ([place]) 08/26/2017  . Adenocarcinoma of upper lobe of right lung 04/30/2015  . Severe obstructive sleep apnea 03/27/2015  . Paroxysmal a-fib ([place]) 05/27/2014  . Essential hypertension 05/27/2014  . Breast cancer ([place]) 04/17/2014  . CAD (coronary artery disease) 04/16/2014    Resolved Hospital Problems  No resolved problems to display.    Discharge Condition: stable  Diet recommendation: heart healthy/carb modified/fluids restriction 1200cc per 24hrs  Filed Weights   11/08/18 0501 11/11/18 0601 11/12/18 0501  Weight: 65.5 kg 65.1 kg 64.6 kg    History of present illness:  Per ED intake on 12/3" Per EMS, pt from [facility] complains of chest pain and shortness of breath since yesterday morning. Pt reports productive cough. 12 lead showed a-fib, pt has hx of same. Pt A&O."  Per admitting MD Dr Hal Hope on 12/3 :  QIO:NGEXB Pamela Mcguire a 83 y.o.femalewithhistory of diastolic CHF, atrial fibrillation, hyperlipidemia, sleep apnea,  COPD presents to the ER because of worsening shortness of breath chest pain. Patient states she has been having shortness of breath and chest pain for last few days which is acutely worsened. Had one episode of nausea vomiting following which her symptoms worsen. Chest pain is retrosternal nonradiating present even at rest. Has been having some productive cough.  ED Course:In the ER patient was hypotensive tachycardic febrile with fever of 102 F. Patient was started on sepsis protocol fluid bolus and started on empiric antibiotics. Patient had CT scan of the chest and abdomen which showed large right-sided pleural effusion. Blood cultures were obtained. UA was unremarkable. And admitted for SIRS source not clear.  Patient received fluids resuscitation in the ED and IV abx, sob worsened, repeat cxr on 12/4 "Large right pleural effusion with a essentially complete collapse of the right lung. Significant progression of right pleural effusion since yesterday" , critical care consulted, s/p bedside thoracentesis on 12/4 with 2 liters of exudative appearing fluid obtained. , repeat cxr post procedure "1. Substantially reduced right pleural effusion and improved right lung ventilation following thoracentesis. There is a trace right apical pneumothorax"  Hospital Course:  Principal Problem:   SIRS (systemic inflammatory response syndrome) (HCC) Active Problems:   Paroxysmal a-fib (HCC)   Essential hypertension   Severe obstructive sleep apnea   Adenocarcinoma of upper lobe of right lung   Breast cancer (HCC)   CAD (coronary artery disease)   COPD exacerbation (HCC)   Chronic respiratory failure with hypoxia ([place]), 2L home O2   Pleural effusion on right   Acute respiratory failure with hypoxia ([place])  on 12/12 " mri brain with "1.  Approximately seven punctate acute infarctsare scattered in the right hemisphere involving the frontal, temporal, and occipital lobes. No associated hemorrhage  or mass effect. 2. Underlying chronic small vessel disease and prior right PCA territory infarct. 3. By patient request the examination had to be discontinued prior to completion."  Resume eliquis Avoid hypotention ( decrease cardizem from 240mg  daily to 120mg  daily with holding parameters)  Possible occlusion of the right ICA distally on carotid US: -Case discussed with neurology on-call Dr. Cheral Marker and  neuro interventionalists Dr.Deweshwar  -Patient appear to be high risk to going  through procedure -Family already expressed not wanting to put patient through more procedures -F/u with Dr Franchot Gallo in two weeks F/u with palliative care at snf as well  Sepsis/Community-acquired bacterial multilobar pneumonia Present on admission -resp culture 12/4: >>>rare hemophilus, blood culture negative, mrsa screening negative -Cefepime was discontinued on 11/04/2018 by PCCM.  -Sepsishasresolved  Acute on chronic hypoxic respiratory failure secondary to pneumonia/bilateral pleural effusions/pulmonary edema/acute on chronic diastolic chf -EF in September 2019 was 60 to 65%. -Patient required BiPAP for little while on 10/31/2018.  -Respiratory status improving. Currently on 2L oxygen via nasal cannula (back to baseline).  -pulmonology /cricital care consulted, now signed off.  -continue lasix 80mg  bid, prior to hospitalization on lasix 40mg  daily ( family reports previously noncompliance with lasix) -patient has wheezing initially, "spirometry from 2018 reviewed, no evidence of significant airway obstruction, FVC was 70% -concern wheezing is "cardiac asthma" / volume overload not bronchospasm " per pulmonlogy -wheezing resolved, o2 now back to baseline -Outpatient follow-up with cardiology Dr Debara Pickett  Recurrent Bilateral pleural effusions right > left   -h/o recurrent bilateral pleural effusion, required thoracentesis in 07/2017, 05/2018, 07/2018 and on 12/4 during this hospitalization.  Cytology has all been negative. Fluids culture no growth - status post right-sided thoracentesis 2 L fluid removed on 10/31/2018 and subsequent right-sided small pneumothorax -PCCM was reconsulted on 11/06/2018 for recurrent right-sided pleural effusion: For now no need for repeat thoracentesis or Pleurx catheter as per PCCM. Consider outpatient PET scan and if she gains strength we can perform a bronchoscopy per pulmonology -Lasix was increased to 80 mg  twice a day on 11/06/2018.  -Patient is having intermittent hemoptysis. Eliquis held on 11/06/2018 as per PCCM. Resumed on 12/12 due to acute cva -no significant hemoptysis, hgb stable -F/u with pulmonology Dr Elsworth Soho    Chest pain with only minimally elevated troponin -Most likely secondary to above. Management as above   Paroxysmal A. fib -Rate controlled.  - Continue digoxin and Cardizem ( dose reduced due to bp low normal) -epixaban held briefly due to hemoptysis, resumed due to acute punctate CVA on mri obtained on 12/12  Leukocytosis -Probably reactive from high-dose steroid use. Improving.  -she is Weaned off  steroids  Hypokalemia/hypomagnesemia -Replace K and mag to keep k>4, mag >2  Thoracolumbar spondylosis with chronic moderate compression deformity of L1 incidental finding on CT   History of breast cancer,status post right mastectomy , in remission H/o stage 2 colon cancer s/p LAPAROSCOPIC PARTIAL COLECTOMY in 01/2017, in remission Stage IIIA (T1a, N2, M0) adenocarcinoma of the lung  -H/o right upper lobe lung mass and mediastinal lymphadenopathy diagnosed in May 2016.  -status post radiation and chemotherapy with partial response and she was not considered a good candidate for consolidation chemotherapy,  and now on surveillance and no active therapy. -Outpatient follow-up with Dr. Julien Nordmann.  OSA -not on therapy, can not wear mask -continue home 02,  maintain saturations >  90%  Dementia Poor memory ,  but aaox3  Generalized deconditioning -SNF with palliative care  -Overall prognosis is guarded. If condition does not improve, consider hospice/comfort measures.    Code Status:DNR  Family Communication:patientand family Disposition Plan:snf with palliative care    Consultants:  Pulmonary critical care  Palliative care  Neurology  Neuro interventionalist  Oncology  Procedures:  Right-sided thoracentesis on 10/31/2018 by PCCM  Antibiotics: Cefepime from 10/30/2018-11/04/2018  Flagyl on 12/4 vancomycin 10/30/2018-10/31/2018  micro data: BCX 12/4> ng resp culture 12/4: >>>rare hemophilus Pleural fluid 12/4>>>ng Strep antigen 12/5: neg Legionella antigen 12/5>>>neg   Discharge Exam: BP 106/63 (BP Location: Right Arm)   Pulse 64   Temp 97.6 F (36.4 C) (Oral)   Resp 16   Ht 5\' 6"  (1.676 m)   Wt 64.6 kg   LMP  (LMP Unknown)   SpO2 96%   BMI 22.99 kg/m     General:Frail, aaox3, but poor memory, h/o right mastectomy   Cardiovascular:IRRR  Respiratory:aeration has improved, still diminished at basis, especially on the right base. no wheezing  Abdomen:Soft/ND/NT, positive BS  Musculoskeletal: No Edema  Neuro: alert, oriented x3   Discharge Instructions You were cared for by a hospitalist during your hospital stay. If you have any questions about your discharge medications or the care you received while you were in the hospital after you are discharged, you can call the unit and asked to speak with the hospitalist on call if the hospitalist that took care of you is not available. Once you are discharged, your primary care physician will handle any further medical issues. Please note that NO REFILLS for any discharge medications will be authorized once you are discharged, as it is imperative that you return to your primary care physician (or establish a relationship with a primary care physician if you do not have one) for your aftercare  needs so that they can reassess your need for medications and monitor your lab values.  Discharge Instructions    Diet - low sodium heart healthy   Complete by:  As directed    Carb modified, fluids restriction to 1200cc per 24hrs.   Increase activity slowly   Complete by:  As directed      Allergies as of 11/12/2018      Reactions   Influenza Vaccines Anaphylaxis   Patient allergic to eggs   Eggs Or Egg-derived Products Nausea And Vomiting      Medication List    STOP taking these medications   loratadine 10 MG tablet Commonly known as:  CLARITIN     TAKE these medications   acetaminophen 325 MG tablet Commonly known as:  TYLENOL Take 2 tablets (650 mg total) by mouth every 6 (six) hours as needed for mild pain (or Fever >/= 101).   albuterol 108 (90 Base) MCG/ACT inhaler Commonly known as:  PROVENTIL HFA;VENTOLIN HFA Inhale 1 puff into the lungs every 6 (six) hours as needed for wheezing or shortness of breath. Reported on 06/01/2016   apixaban 5 MG Tabs tablet Commonly known as:  ELIQUIS Take 1 tablet (5 mg total) by mouth 2 (two) times daily.   atorvastatin 40 MG tablet Commonly known as:  LIPITOR TAKE 1 TABLET(40 MG) BY MOUTH DAILY What changed:  Another medication with the same name was removed. Continue taking this medication, and follow the directions you see here.   DIGOX 0.125 MG tablet Generic drug:  digoxin TAKE 1 TABLET(0.125 MG) BY MOUTH DAILY What changed:  See  the new instructions.   diltiazem 120 MG 24 hr capsule Commonly known as:  CARDIZEM CD Take 1 capsule (120 mg total) by mouth daily. What changed:    medication strength  how much to take   ferrous sulfate 325 (65 FE) MG tablet Take 325 mg by mouth every evening.   furosemide 80 MG tablet Commonly known as:  LASIX Take 1 tablet (80 mg total) by mouth 2 (two) times daily. What changed:    medication strength  how much to take  Another medication with the same name was removed.  Continue taking this medication, and follow the directions you see here.   guaiFENesin 600 MG 12 hr tablet Commonly known as:  MUCINEX Take 1 tablet (600 mg total) by mouth 2 (two) times daily.   ipratropium-albuterol 0.5-2.5 (3) MG/3ML Soln Commonly known as:  DUONEB Take 3 mLs by nebulization 4 (four) times daily as needed. What changed:    when to take this  reasons to take this   magnesium gluconate 30 MG tablet Commonly known as:  MAGONATE Take 1 tablet (30 mg total) by mouth daily.   mirtazapine 15 MG tablet Commonly known as:  REMERON TAKE 1 TABLET(15 MG) BY MOUTH AT BEDTIME What changed:  Another medication with the same name was removed. Continue taking this medication, and follow the directions you see here.   multivitamin with minerals tablet Take 1 tablet by mouth daily. gummie   NUTRITIONAL SUPPLEMENT Liqd Take 120 mLs by mouth daily as needed (hydration). MedPass   OXYGEN Inhale 2 L into the lungs daily.   pantoprazole 40 MG tablet Commonly known as:  PROTONIX TAKE 1 TABLET(40 MG) BY MOUTH DAILY What changed:  See the new instructions.   potassium chloride SA 20 MEQ tablet Commonly known as:  K-DUR,KLOR-CON Take 2 tablets (40 mEq total) by mouth every Monday, Wednesday, and Friday.   sertraline 25 MG tablet Commonly known as:  ZOLOFT Take 1-2 tablets (25-50 mg total) by mouth daily. What changed:  how much to take   tolterodine 4 MG 24 hr capsule Commonly known as:  DETROL LA Take 1 capsule (4 mg total) by mouth daily.      Allergies  Allergen Reactions  . Influenza Vaccines Anaphylaxis    Patient allergic to eggs  . Eggs Or Egg-Derived Products Nausea And Vomiting   Follow-up Information    Hoyt Koch, MD Follow up.   Specialty:  Internal Medicine Contact information: [place] [id number] [phone]        Pixie Casino, MD Follow up in 3 week(s).   Specialty:  Cardiology Why:  diastolic  chf/afib Contact information: [place] [address] [phone]        Rigoberto Noel, MD Follow up in 3 week(s).   Specialty:  Pulmonary Disease Why:  recurrent right side pleural effusion, pleurx evaluation /bronchoscopy? /PET scan? Contact information: [place] [address] [phone]        Luanne Bras, MD Follow up in 2 week(s).   Specialties:  Interventional Radiology, Radiology Why:  distal right carotid artery stenosis/occlusion  Contact information: [place] [address] [phone]            The results of significant diagnostics from this hospitalization (including imaging, microbiology, ancillary and laboratory) are listed below for reference.    Significant Diagnostic Studies: Dg Chest 1 View  Result Date: 10/31/2018 CLINICAL  DATA:  Short of breath EXAM: CHEST  1 VIEW COMPARISON:  10/30/2018 FINDINGS: Large right pleural effusion with a essentially complete collapse of the right lung. Significant progression of right pleural effusion since yesterday. Increased reticular markings throughout the left lung suggestive of pulmonary edema. Small left effusion. Atherosclerotic aortic arch. IMPRESSION: Progression of large right pleural effusion with near complete collapse of the right lung Progression of reticular lung markings in the left lung suggestive of pulmonary edema. Electronically Signed   By: Franchot Gallo M.D.   On: 10/31/2018 08:29   Dg Chest 2 View  Result Date: 11/06/2018 CLINICAL DATA:  Dyspnea. Right pleural effusion. History of right lung adenocarcinoma status post radiation and chemotherapy. EXAM: CHEST - 2 VIEW COMPARISON:  Chest x-rays dated 11/04/2018, 11/02/2018 and 10/31/2018 and chest CT dated 10/31/2018 FINDINGS: The moderate right pleural effusion has shifted and is now primarily at the base rather than at the base and right apex. The interstitial markings are  accentuated on the left but there is no longer any consolidation. Small left effusion noted on the prior CT scan has completely resolved. Heart size and vascularity are normal. Aortic atherosclerosis. IMPRESSION: Moderate right effusion, persistent but slightly changed in position. Resolution of small left effusion. Aortic Atherosclerosis (ICD10-I70.0). Electronically Signed   By: Lorriane Shire M.D.   On: 11/06/2018 09:27   Dg Chest 2 View  Result Date: 10/30/2018 CLINICAL DATA:  Cough and shortness of breath EXAM: CHEST - 2 VIEW COMPARISON:  08/29/2018 FINDINGS: Cardiac shadow is enlarged but stable. Aortic calcifications are again seen. Elevation of the right hemidiaphragm is again noted consistent with prior volume loss. Increased density is noted in the right paratracheal region stable from the previous exam. Patchy atelectatic changes are noted in the bases bilaterally. No sizable effusion is noted. No bony abnormality is seen. IMPRESSION: Patchy bibasilar atelectasis superimposed over more chronic changes with elevation of the right hemidiaphragm. Electronically Signed   By: Inez Catalina M.D.   On: 10/30/2018 16:23   Ct Chest Wo Contrast  Result Date: 10/31/2018 CLINICAL DATA:  Non-small cell lung cancer with locoregional recurrence. Assess response to therapy. EXAM: CT CHEST WITHOUT CONTRAST TECHNIQUE: Multidetector CT imaging of the chest was performed following the standard protocol without IV contrast. COMPARISON:  10/30/2018 FINDINGS: Cardiovascular: Mild cardiac enlargement. Extensive aortic atherosclerosis. Three vessel coronary artery atherosclerotic calcifications noted. Mediastinum/Nodes: Small low-attenuation thyroid nodules are again noted. The trachea appears patent and is midline. Normal appearance of the esophagus. No axillary or supraclavicular adenopathy. High right paratracheal lymph node measures 0.9 cm, image 45/3. Unchanged from previous exam. Left pre-vascular lymph node measures  1.1 cm, image 52/3. Unchanged from previous exam. Subcarinal lymph node is enlarged measuring 1.6 cm, image 70/3. Unchanged. The hilar regions are suboptimally evaluated due to lack of IV contrast material. Lungs/Pleura: Moderate to large right pleural effusion is again noted. This is decreased in size when compared with the previous exam status post thoracentesis. Tiny right anterior and medial pneumothorax is identified overlying the right lower lobe and right middle lobe. There is a new small to moderate left pleural effusion identified. Progressive multifocal bilateral areas of airspace consolidation with surrounding ground-glass attenuation are identified bilaterally. This includes a new area within the posterior and lateral left upper lobe, image, image 44/8. Also new, is dense airspace consolidation and atelectasis within the left lower lobe overlying the new left pleural effusion, image 113/7. Masslike architectural distortion and fibrosis within the paramediastinal right lung is identified compatible  with treatment related changes secondary to external beam radiation, image 57/8. Within the anterior right lower lobe there is a focal area of persistent and mildly progressive nodularity measuring 1.8 by 1.1 cm, image 33/6. On 01/02/2018 this measured 1.4 by 1.0 cm. Nonspecific nodularity within the right middle lobe is identified on image 93/8. This may be postinflammatory or infectious in etiology. Upper Abdomen: No acute abnormality. Musculoskeletal: No suspicious bone lesions identified.Compression fracture involving the L1 vertebra is unchanged. IMPRESSION: 1. Interval decrease in volume of right pleural effusion status post thoracentesis. There is a tiny right right-sided pneumothorax. 2. Interval development of moderate left pleural effusion. 3. Interval development of dense left lower lobe airspace consolidation and patchy areas of consolidation within the left upper lobe with surrounding areas of  ground-glass attenuation. These are new when compared with 10/30/2018 and are favored to represent progressive changes of inflammation and/or infection. A nodular density within the anterior right lower lobe is a noted and demonstrates mild increase in size compared with 01/02/2018. This is nonspecific. Residual/recurrence of tumor is not excluded. Masslike architectural distortion and fibrosis within the right upper lung is again noted and is presumed to represent post treatment changes. 4. No significant change in prominent right paratracheal and prevascular lymph nodes and enlarged subcarinal lymph node. In the setting of infection and/or CHF this is a nonspecific finding. Metastatic adenopathy cannot be excluded. Electronically Signed   By: Kerby Moors M.D.   On: 10/31/2018 16:20   Ct Angio Chest Pe W And/or Wo Contrast  Result Date: 10/30/2018 CLINICAL DATA:  Chest pain and dyspnea since yesterday productive cough. History of breast cancer, stage III non-small cell cancer, mastectomy and cholecystectomy EXAM: CT ANGIOGRAPHY CHEST CT ABDOMEN AND PELVIS WITH CONTRAST TECHNIQUE: Multidetector CT imaging of the chest was performed using the standard protocol during bolus administration of intravenous contrast. Multiplanar CT image reconstructions and MIPs were obtained to evaluate the vascular anatomy. Multidetector CT imaging of the abdomen and pelvis was performed using the standard protocol during bolus administration of intravenous contrast. CONTRAST:  134mL ISOVUE-370 IOPAMIDOL (ISOVUE-370) INJECTION 76% COMPARISON:  07/04/2017 CT chest, abdomen and pelvis. FINDINGS: CTA CHEST FINDINGS Cardiovascular: Satisfactory opacification of the pulmonary arteries to the segmental level. No evidence of pulmonary embolism. Conventional branch pattern of the great vessels with atherosclerosis of the great vessels. Moderate atheromatous calcification of the thoracic aorta without dissection or aneurysm. Left main and  three-vessel coronary arteriosclerosis is identified. No pericardial effusion thickening. Mediastinum/Nodes: The previously noted subcarinal adenopathy is obscured by presence of a large right pleural effusion. Mildly enlarged prevascular lymph nodes are identified since prior measuring up to 11 mm short axis. 10 mm right upper paratracheal lymph node is also noted. No definite hilar adenopathy. The right hilum however is obscured by atelectasis and pleural fluid. No thyromegaly. Cystic nodules are noted in the lower poles of the thyroid gland measuring up to 1 cm each. The trachea is midline and patent. The mainstem bronchi appear patent. The esophagus is unremarkable. Lungs/Pleura: Large right effusion with compressive atelectasis, new since prior. These findings obscure the pulmonary nodularity noted at the right lung base on prior exam. New ill-defined opacity adjacent to the right heart border is nonspecific but may also represent focal atelectasis and/or scarring. Nodule is not entirely excluded, series 93, measuring 16 mm. Short-term interval follow-up is recommended. Redemonstration of subpleural reticular opacities bilaterally with superimposed mild diffuse interstitial edema noted. Pulmonary nodule at the right lung base is respiratory motion artifacts  limit assessment. No pneumothorax. No significant pleural effusion on the left. Musculoskeletal: Mild multilevel thoracic spondylosis disc space narrowing and endplate spurring since our prior. No aggressive osseous lesions. Right mastectomy with associated right axillary lymph node dissection. Review of the MIP images confirms the above findings. CT ABDOMEN and PELVIS FINDINGS Hepatobiliary: No focal liver abnormality is seen. Status post cholecystectomy. Slight reservoir effect status post cholecystectomy believed to account for the slight intrahepatic ductal dilatation of the left lobe. Findings are similar to prior however. Pancreas: Unremarkable. No  pancreatic ductal dilatation or surrounding inflammatory changes. Spleen: Small splenule along the caudal aspect. The spleen abuts the slightly enlarged and elongated left hepatic lobe the subtle hypodensity in the spleen is less apparent and may have represented a small hemangioma or partial volume averaging. Adrenals/Urinary Tract: Stable hypodense left adrenal nodule measuring 7 mm. The right adrenal gland is normal. Stable bilateral renal cysts, the largest on right measuring 3.7 cm versus 3.5 cm previously. Smaller cysts are noted bilaterally too small further characterize. No nephrolithiasis or hydronephrosis. The urinary bladder is unremarkable for the degree of distention. Stomach/Bowel: Decompressed stomach. Stable duodenal diverticulum off the distal second portion. Normal small bowel rotation and ligament Treitz position partial right colectomy. Colonic diverticulosis along the descending and sigmoid colon without acute diverticulitis. Probable large diverticulum off the descending colon given similar appearance, series 11/63. Vascular/Lymphatic: Moderate aortoiliac atherosclerosis without aneurysm. No lymphadenopathy. Reproductive: Uterus and bilateral adnexa are unremarkable. Mild engorgement of left periuterine and gonadal veins. Other: Small bilateral fat containing inguinal hernias. No abdominopelvic ascites. Musculoskeletal: Chronic stable moderate compression deformity of L1 with 50% loss. No aggressive or suspicious osseous lesions. Review of the MIP images confirms the above findings. IMPRESSION: Chest CT: 1. Moderate to large right pleural effusion with adjacent compressive atelectasis. 2. Chronic stable subpleural reticulations within both lungs. 16 mm rounded opacity adjacent to the right heart border may represent a focus of rounded atelectasis. As a pulmonary nodule is not entirely excluded, consider short-term interval follow-up or PET-CT. 3. No acute pulmonary embolus. 4. Coronary  arteriosclerosis and aortic atherosclerosis. CT AP: 1. Stable subcentimeter left adrenal nodule measuring 7 mm. 2. Descending and sigmoid diverticulosis without acute diverticulitis. 3. Status post partial right colectomy without complicating features. No bowel obstruction or inflammation. 4. Thoracolumbar spondylosis with chronic moderate compression deformity of L1. Electronically Signed   By: Ashley Royalty M.D.   On: 10/30/2018 19:42   Mr Brain Wo Contrast  Result Date: 11/08/2018 CLINICAL DATA:  83 year old female with persistent vertigo. EXAM: MRI HEAD WITHOUT CONTRAST TECHNIQUE: Multiplanar, multiecho pulse sequences of the brain and surrounding structures were obtained without intravenous contrast. COMPARISON:  Brain MRI 04/29/2015. FINDINGS: The examination had to be discontinued prior to completion due to patient request. Only a axial and coronal diffusion weighted imaging, sagittal T1 and axial T2 weighted imaging was obtained. Brain: There are several punctate foci of restricted diffusion in the right occipital and posterior right temporal lobes, and at the anterior right frontal operculum. Superimposed chronic encephalomalacia in the right occipital pole. No associated hemorrhage or mass effect. No convincing left hemisphere or posterior fossa restricted diffusion. Chronic lacunar infarcts in the bilateral corona radiata, left greater than white deep gray matter, and inferior right cerebellum. Generalized cerebral volume loss since 2016. No ventriculomegaly. Negative pituitary and cervicomedullary junction. Vascular: Major intracranial vascular flow voids are stable since 2016. Skull and upper cervical spine: Normal bone marrow signal. Negative visible cervical spine. Sinuses/Orbits: Postoperative changes to the right  globe. Paranasal Visualized paranasal sinuses and mastoids are stable and well pneumatized. Other: Visible internal auditory structures appear normal. IMPRESSION: 1. Approximately seven  punctate acute infarcts are scattered in the right hemisphere involving the frontal, temporal, and occipital lobes. No associated hemorrhage or mass effect. 2. Underlying chronic small vessel disease and prior right PCA territory infarct. 3. By patient request the examination had to be discontinued prior to completion. Electronically Signed   By: Genevie Ann M.D.   On: 11/08/2018 15:27   Dg Chest Bilateral Decubitus  Result Date: 11/06/2018 CLINICAL DATA:  Pleural effusions.  Dyspnea. EXAM: CHEST - BILATERAL DECUBITUS VIEW COMPARISON:  Chest x-rays dated 11/06/2018 and 11/04/2018 and 10/31/2018 FINDINGS: There is no residual left pleural effusion. There is a moderate right pleural effusion which shifts and layers laterally in the decubitus position. However, some of the effusion is loculated at the apex on the right. IMPRESSION: 1. Moderate right effusion some of which is free-flowing. 2. Resolution of small left effusion. Electronically Signed   By: Lorriane Shire M.D.   On: 11/06/2018 09:29   Ct Abdomen Pelvis W Contrast  Result Date: 10/30/2018 CLINICAL DATA:  Chest pain and dyspnea since yesterday productive cough. History of breast cancer, stage III non-small cell cancer, mastectomy and cholecystectomy EXAM: CT ANGIOGRAPHY CHEST CT ABDOMEN AND PELVIS WITH CONTRAST TECHNIQUE: Multidetector CT imaging of the chest was performed using the standard protocol during bolus administration of intravenous contrast. Multiplanar CT image reconstructions and MIPs were obtained to evaluate the vascular anatomy. Multidetector CT imaging of the abdomen and pelvis was performed using the standard protocol during bolus administration of intravenous contrast. CONTRAST:  172mL ISOVUE-370 IOPAMIDOL (ISOVUE-370) INJECTION 76% COMPARISON:  07/04/2017 CT chest, abdomen and pelvis. FINDINGS: CTA CHEST FINDINGS Cardiovascular: Satisfactory opacification of the pulmonary arteries to the segmental level. No evidence of pulmonary  embolism. Conventional branch pattern of the great vessels with atherosclerosis of the great vessels. Moderate atheromatous calcification of the thoracic aorta without dissection or aneurysm. Left main and three-vessel coronary arteriosclerosis is identified. No pericardial effusion thickening. Mediastinum/Nodes: The previously noted subcarinal adenopathy is obscured by presence of a large right pleural effusion. Mildly enlarged prevascular lymph nodes are identified since prior measuring up to 11 mm short axis. 10 mm right upper paratracheal lymph node is also noted. No definite hilar adenopathy. The right hilum however is obscured by atelectasis and pleural fluid. No thyromegaly. Cystic nodules are noted in the lower poles of the thyroid gland measuring up to 1 cm each. The trachea is midline and patent. The mainstem bronchi appear patent. The esophagus is unremarkable. Lungs/Pleura: Large right effusion with compressive atelectasis, new since prior. These findings obscure the pulmonary nodularity noted at the right lung base on prior exam. New ill-defined opacity adjacent to the right heart border is nonspecific but may also represent focal atelectasis and/or scarring. Nodule is not entirely excluded, series 93, measuring 16 mm. Short-term interval follow-up is recommended. Redemonstration of subpleural reticular opacities bilaterally with superimposed mild diffuse interstitial edema noted. Pulmonary nodule at the right lung base is respiratory motion artifacts limit assessment. No pneumothorax. No significant pleural effusion on the left. Musculoskeletal: Mild multilevel thoracic spondylosis disc space narrowing and endplate spurring since our prior. No aggressive osseous lesions. Right mastectomy with associated right axillary lymph node dissection. Review of the MIP images confirms the above findings. CT ABDOMEN and PELVIS FINDINGS Hepatobiliary: No focal liver abnormality is seen. Status post cholecystectomy.  Slight reservoir effect status post cholecystectomy believed to  account for the slight intrahepatic ductal dilatation of the left lobe. Findings are similar to prior however. Pancreas: Unremarkable. No pancreatic ductal dilatation or surrounding inflammatory changes. Spleen: Small splenule along the caudal aspect. The spleen abuts the slightly enlarged and elongated left hepatic lobe the subtle hypodensity in the spleen is less apparent and may have represented a small hemangioma or partial volume averaging. Adrenals/Urinary Tract: Stable hypodense left adrenal nodule measuring 7 mm. The right adrenal gland is normal. Stable bilateral renal cysts, the largest on right measuring 3.7 cm versus 3.5 cm previously. Smaller cysts are noted bilaterally too small further characterize. No nephrolithiasis or hydronephrosis. The urinary bladder is unremarkable for the degree of distention. Stomach/Bowel: Decompressed stomach. Stable duodenal diverticulum off the distal second portion. Normal small bowel rotation and ligament Treitz position partial right colectomy. Colonic diverticulosis along the descending and sigmoid colon without acute diverticulitis. Probable large diverticulum off the descending colon given similar appearance, series 11/63. Vascular/Lymphatic: Moderate aortoiliac atherosclerosis without aneurysm. No lymphadenopathy. Reproductive: Uterus and bilateral adnexa are unremarkable. Mild engorgement of left periuterine and gonadal veins. Other: Small bilateral fat containing inguinal hernias. No abdominopelvic ascites. Musculoskeletal: Chronic stable moderate compression deformity of L1 with 50% loss. No aggressive or suspicious osseous lesions. Review of the MIP images confirms the above findings. IMPRESSION: Chest CT: 1. Moderate to large right pleural effusion with adjacent compressive atelectasis. 2. Chronic stable subpleural reticulations within both lungs. 16 mm rounded opacity adjacent to the right heart  border may represent a focus of rounded atelectasis. As a pulmonary nodule is not entirely excluded, consider short-term interval follow-up or PET-CT. 3. No acute pulmonary embolus. 4. Coronary arteriosclerosis and aortic atherosclerosis. CT AP: 1. Stable subcentimeter left adrenal nodule measuring 7 mm. 2. Descending and sigmoid diverticulosis without acute diverticulitis. 3. Status post partial right colectomy without complicating features. No bowel obstruction or inflammation. 4. Thoracolumbar spondylosis with chronic moderate compression deformity of L1. Electronically Signed   By: Ashley Royalty M.D.   On: 10/30/2018 19:42   Dg Chest Port 1 View  Result Date: 11/04/2018 CLINICAL DATA:  Acute respiratory failure. EXAM: PORTABLE CHEST 1 VIEW COMPARISON:  11/02/2018 FINDINGS: Patient is rotated to the left. Heart size is stable. Aortic atherosclerosis. Layering right pleural effusion shows no significant change in size. There's persistent atelectasis in the left upper lobe, however decreased atelectasis is noted in both lung bases. IMPRESSION: Stable layering right pleural effusion and right upper lobe atelectasis. Decreased bibasilar atelectasis. Electronically Signed   By: Earle Gell M.D.   On: 11/04/2018 07:31   Dg Chest Port 1 View  Result Date: 11/02/2018 CLINICAL DATA:  Cough, chest congestion, and shortness of breath. History of previous episodes of pneumonia, right lung malignancy, breast malignancy, COPD. Former smoker. EXAM: PORTABLE CHEST 1 VIEW COMPARISON:  Chest x-ray of October 31, 2018 and chest CT scan of the same date FINDINGS: Since the previous study there has developed a larger right pleural effusion with fluid now lying along the superior and lateral aspects of the right lung as well as inferiorly. The tiny right apical pneumothorax has resolved. On the left the interstitial markings are coarse but fairly stable. There is a small left pleural effusion which is stable. The right heart  border is largely obscured. The heart does not appear significantly enlarged. The pulmonary vascularity is indistinct but not clearly engorged. There is calcification in the wall of the aortic arch. There is multilevel degenerative disc disease of the thoracic spine. IMPRESSION: Interval worsening  in the appearance of the right hemithorax with increased volume of pleural fluid present. Stable appearance of densities in the left upper and left lower lobes and the small left pleural effusion. Electronically Signed   By: David  Martinique M.D.   On: 11/02/2018 09:33   Dg Chest Port 1 View  Addendum Date: 10/31/2018   ADDENDUM REPORT: 10/31/2018 12:16 ADDENDUM: Trace right pneumothorax discussed by telephone with Dr. Salvadore Dom on 10/31/2018 at 1149 hours. Electronically Signed   By: Genevie Ann M.D.   On: 10/31/2018 12:16   Result Date: 10/31/2018 CLINICAL DATA:  83 year old female status post thoracentesis. EXAM: PORTABLE CHEST 1 VIEW COMPARISON:  0740 hours today and earlier. FINDINGS: Portable AP semi upright view at 1043 hours. Substantially reduced right pleural effusion and improved right lung ventilation. There is a trace right apical pneumothorax visible. There is right upper lobe and persistent left lung reticulonodular opacity which is nonspecific. Dense retrocardiac opacity with obscuration of the left hemidiaphragm has increased since 10/30/2018. Calcified aortic atherosclerosis. IMPRESSION: 1. Substantially reduced right pleural effusion and improved right lung ventilation following thoracentesis. There is a trace right apical pneumothorax. 2. Nonspecific bilateral reticulonodular opacity and increased left lower lobe collapse or consolidation since 10/30/2018. Electronically Signed: By: Genevie Ann M.D. On: 10/31/2018 11:44   Vas US Carotid (at [facility] Only)  Result Date: 11/09/2018 Carotid Arterial Duplex Study Indications:       CVA. Risk Factors:      Hypertension. Comparison Study:  No prior study  available Performing Technologist: Rudell Cobb  Examination Guidelines: A complete evaluation includes B-mode imaging, spectral Doppler, color Doppler, and power Doppler as needed of all accessible portions of each vessel. Bilateral testing is considered an integral part of a complete examination. Limited examinations for reoccurring indications may be performed as noted.  Right Carotid Findings: +----------+--------+--------+--------+-------------------+--------------------+           PSV cm/sEDV cm/sStenosisDescribe           Comments             +----------+--------+--------+--------+-------------------+--------------------+ CCA Prox  65      11                                                      +----------+--------+--------+--------+-------------------+--------------------+ CCA Distal58      13              hypoechoic and                                                            diffuse                                 +----------+--------+--------+--------+-------------------+--------------------+ ICA Prox  101     21      1-39%   focal, hyperechoic Shadowing  and calcific                            +----------+--------+--------+--------+-------------------+--------------------+ ICA Mid   77      27              hyperechoic and                                                           diffuse                                 +----------+--------+--------+--------+-------------------+--------------------+ ICA Distal                Occludedhypoechoic and     3 different attempts                                   diffuse            demonstrate possible                                                      occlusion distally   +----------+--------+--------+--------+-------------------+--------------------+ ECA       62                                                               +----------+--------+--------+--------+-------------------+--------------------+ +----------+--------+-------+--------+-------------------+           PSV cm/sEDV cmsDescribeArm Pressure (mmHG) +----------+--------+-------+--------+-------------------+ Subclavian113                                        +----------+--------+-------+--------+-------------------+ +---------+--------+--+--------+--+---------+ VertebralPSV cm/s42EDV cm/s10Antegrade +---------+--------+--+--------+--+---------+  Left Carotid Findings: +----------+--------+--------+--------+------------------------------+---------+           PSV cm/sEDV cm/sStenosisDescribe                      Comments  +----------+--------+--------+--------+------------------------------+---------+ CCA Prox  107     17                                                      +----------+--------+--------+--------+------------------------------+---------+ CCA Distal76      14              diffuse and hypoechoic                  +----------+--------+--------+--------+------------------------------+---------+ ICA Prox  59      14      1-39%   focal, hyperechoic and        Shadowing  calcific                                +----------+--------+--------+--------+------------------------------+---------+ ICA Mid   69      20                                                      +----------+--------+--------+--------+------------------------------+---------+ ICA Distal74      14                                                      +----------+--------+--------+--------+------------------------------+---------+ ECA       123                                                             +----------+--------+--------+--------+------------------------------+---------+ +----------+--------+--------+--------+-------------------+ SubclavianPSV cm/sEDV cm/sDescribeArm Pressure  (mmHG) +----------+--------+--------+--------+-------------------+           282                                         +----------+--------+--------+--------+-------------------+ +---------+--------+--+--------+--+---------+ VertebralPSV cm/s60EDV cm/s12Antegrade +---------+--------+--+--------+--+---------+  Summary: Right Carotid: 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                Right ICA prox and mid are patent, however, with three different                samples doppler attempts demonstrate occlusion at the distal                segment. Left Carotid: Velocities in the left ICA are consistent with a 1-39% stenosis. Vertebrals: Bilateral vertebral arteries demonstrate antegrade flow. *See table(s) above for measurements and observations.  Electronically signed by Deitra Mayo MD on 11/09/2018 at 1:52:03 PM.    Final     Microbiology: No results found for this or any previous visit (from the past 240 hour(s)).   Labs: Basic Metabolic Panel: Recent Labs  Lab 11/07/18 0506 11/08/18 0510 11/09/18 0530 11/10/18 0441 11/11/18 0528 11/12/18 0455  NA 138 140 140 139 140 139  K 3.9 3.6 3.7 3.8 3.6 3.6  CL 90* 93* 93* 91* 94* 94*  CO2 36* 37* 34* 36* 36* 35*  GLUCOSE 198* 146* 154* 137* 149* 138*  BUN 30* 23 26* 28* 27* 23  CREATININE 0.96 0.82 0.93 0.90 0.97 0.86  CALCIUM 9.0 8.8* 8.8* 8.7* 8.8* 9.0  MG 1.9 1.8 1.7 2.0 2.0  --    Liver Function Tests: No results for input(s): AST, ALT, ALKPHOS, BILITOT, PROT, ALBUMIN in the last 168 hours. No results for input(s): LIPASE, AMYLASE in the last 168 hours. No results for input(s): AMMONIA in the last 168 hours. CBC: Recent Labs  Lab 11/06/18 0445 11/07/18 0506 11/08/18 0510 11/09/18 0530 11/11/18 0528  WBC 12.5* 13.2* 11.7* 12.8* 11.6*  NEUTROABS  --   --   --   --  10.2*  HGB 12.0 12.2 12.1 12.4 12.2  HCT 37.7 38.1 38.2 39.7 38.3  MCV 94.0 93.4 93.4 92.5 91.8  PLT 434* 496* 507*  546* 510*   Cardiac Enzymes: No results for input(s): CKTOTAL, CKMB, CKMBINDEX, TROPONINI in the last 168 hours. BNP: BNP (last 3 results) Recent Labs    08/24/18 1626  BNP 156.0*    ProBNP (last 3 results) Recent Labs    09/12/18 1130  PROBNP 175.0*    CBG: Recent Labs  Lab 11/11/18 1229 11/11/18 1720 11/11/18 2025 11/12/18 0745 11/12/18 1224  GLUCAP 152* 206* 216* 124* 193*       Signed:  Florencia Reasons MD, PhD  [facility] 11/12/2018, 3:18 PM

## 2018-11-12 NOTE — Clinical Social Work Placement (Signed)
Pt admitting to [facility]- report [phone]. [employer] Bernadene Bell) authorized admission today. Daughter [name] aware and agreeable. Will arrange [facility] transportation.   CLINICAL SOCIAL WORK PLACEMENT  NOTE  Date:  11/12/2018  Patient Details  Name: Pamela Mcguire MRN: [id number] Date of Birth: 11/21/1935  Clinical Social Work is seeking post-discharge placement for this patient at the [place] level of care (*CSW will initial, date and re-position this form in  chart as items are completed):  Yes   Patient/family provided with [facility] Work Department's list of facilities offering this level of care within the geographic area requested by the patient (or if unable, by the patient's family).  Yes   Patient/family informed of their freedom to choose among providers that offer the needed level of care, that participate in Medicare, Medicaid or managed care program needed by the patient, have an available bed and are willing to accept the patient.  Yes   Patient/family informed of [facility]'s ownership interest in [facility] and [facility], as well as of the fact that they are under no obligation to receive care at these facilities.  PASRR submitted to EDS on 11/09/18     PASRR number received on       Existing PASRR number confirmed on       FL2 transmitted to all facilities in geographic area requested by pt/family on 11/09/18     FL2 transmitted to all facilities within larger geographic area on       Patient informed that his/her managed care company has contracts with or will negotiate with certain facilities, including the following:        Yes   Patient/family informed of bed offers received.  Patient chooses bed at [facility]     Physician recommends and patient chooses bed at [facility]    Patient to be transferred to [facility] on 11/12/18.  Patient to be transferred to facility by [facility]     Patient family  notified on 11/12/18 of transfer.  Name of family member notified:  daughter [name]     PHYSICIAN       Additional Comment:    _______________________________________________ Nila Nephew, LCSW 11/12/2018, 2:29 PM [phone]

## 2018-11-12 NOTE — Evaluation (Signed)
Occupational Therapy Evaluation Patient Details Name: Pamela Mcguire MRN: [id number] DOB: October 17, 1935 Today's Date: 11/12/2018    History of Present Illness 83 y.o. female with past medical history significant for right lung adenocarcinoma status post radiation and chemotherapy in the past and now on surveillance and no active therapy, history of breast cancer status post right mastectomy and colon cancer in remission, COPD, L1 compression fx, afib and pt was found to be hypotensive, tachycardic and febrile in the ED.  CT scan of the chest and abdomen showed large right-sided pleural effusion   Clinical Impression   .Pt is making slow, but steady progress.  She requires less assist for functional mobility.  She is difficult to engage in conversation and at times does not perform activities as asked - unsure if this due to cognitive deficit or due to potential depression.  She voices considerable frustration re: her situation and being in the hospital.  She requires min - max A, overall, for ADLs.  Continue to recommend SNF level rehab as pt was mod I PTA at ALF.  Will follow.     Follow Up Recommendations  SNF    Equipment Recommendations  3 in 1 bedside commode    Recommendations for Other Services       Precautions / Restrictions Precautions Precautions: Fall Precaution Comments: chronic oxygen (2L O2 baseline) Restrictions Weight Bearing Restrictions: No      Mobility Bed Mobility Overal bed mobility: Needs Assistance Bed Mobility: Supine to Sit     Supine to sit: Min assist     General bed mobility comments: assist to lift trunk   Transfers Overall transfer level: Needs assistance Equipment used: Rolling walker (2 wheeled) Transfers: Sit to/from [facility] Sit to Stand: Min assist Stand pivot transfers: Min assist       General transfer comment: cues for hand placement.  Assist to power up into standing and assist for balance     Balance Overall  balance assessment: Needs assistance Sitting-balance support: Feet supported Sitting balance-Leahy Scale: Good     Standing balance support: Bilateral upper extremity supported Standing balance-Leahy Scale: Poor Standing balance comment: requires UE support and min A                            ADL either performed or assessed with clinical judgement   ADL Overall ADL's : Needs assistance/impaired Eating/Feeding: Set up;Bed level;Sitting   Grooming: Wash/dry face;Wash/dry hands;Oral care;Brushing hair;Minimal assistance;Sitting   Upper Body Bathing: Moderate assistance;Sitting   Lower Body Bathing: Moderate assistance;Sit to/from stand       Lower Body Dressing: Maximal assistance;Sit to/from stand   Toilet Transfer: Minimal assistance;Stand-pivot;BSC   Toileting- Clothing Manipulation and Hygiene: Minimal assistance;Moderate assistance;Sit to/from stand         General ADL Comments: Pt difficult to engage in ADL activities      Vision         Perception     Praxis      Pertinent Vitals/Pain Pain Assessment: Faces Faces Pain Scale: Hurts a little bit Pain Location: generalized  Pain Descriptors / Indicators: Guarding Pain Intervention(s): Monitored during session;Repositioned     Hand Dominance     Extremity/Trunk Assessment Upper Extremity Assessment Upper Extremity Assessment: Generalized weakness   Lower Extremity Assessment Lower Extremity Assessment: Defer to PT evaluation       Communication     Cognition Arousal/Alertness: Awake/alert Behavior During Therapy: [facility] for tasks assessed/performed Overall Cognitive  Status: Difficult to assess Area of Impairment: Attention;Following commands;Safety/judgement;Problem solving                   Current Attention Level: Sustained;Selective   Following Commands: Follows one step commands consistently;Follows multi-step commands inconsistently Safety/Judgement: Decreased awareness of  deficits;Decreased awareness of safety   Problem Solving: Slow processing;Decreased initiation;Difficulty sequencing;Requires verbal cues;Requires tactile cues General Comments: Pt has flat affect.  She is minimally interactive initially and only speaks when spoken to, and only answers with one word responses.  She eventually voices that she is tired of being in hospital and with her situation.  She did become a bit more interactive and smiled a bit toward end of session.  This made assessing cognition difficult    General Comments  DOE3/4-4/4 on 2L supplemental 02.  Pt was instructed in pursed lip breathing, but required max cues to actually perform     Exercises     Shoulder Instructions      Home Living                                          Prior Functioning/Environment                   OT Problem List:        OT Treatment/Interventions:      OT Goals(Current goals can be found in the care plan section)    OT Frequency: Min 2X/week   Barriers to D/C:            Co-evaluation              AM-PAC OT "6 Clicks" Daily Activity     Outcome Measure Help from another person eating meals?: None Help from another person taking care of personal grooming?: None Help from another person toileting, which includes using toliet, bedpan, or urinal?: A Lot Help from another person bathing (including washing, rinsing, drying)?: A Lot Help from another person to put on and taking off regular upper body clothing?: A Little Help from another person to put on and taking off regular lower body clothing?: A Lot 6 Click Score: 17   End of Session Equipment Utilized During Treatment: Rolling walker;Oxygen;Gait belt Nurse Communication: Mobility status  Activity Tolerance: Patient limited by fatigue Patient left: in chair;with call bell/phone within reach  OT Visit Diagnosis: Unsteadiness on feet (R26.81);Muscle weakness (generalized) (M62.81)                 Time: 1135-1150 OT Time Calculation (min): 15 min Charges:  OT General Charges $OT Visit: 1 Visit OT Treatments $Therapeutic Activity: 8-22 mins  Lucille Passy, OTR/L [place] Pager [phone] Office [phone]   Lucille Passy M 11/12/2018, 12:35 PM

## 2018-11-12 NOTE — Care Management Important Message (Signed)
Important Message  Patient Details  Name: Pamela Mcguire MRN: [id number] Date of Birth: [birth year]   Medicare Important Message Given:  Yes    Kerin Salen 11/12/2018, 2:44 [place] Message  Patient Details  Name: Pamela Mcguire MRN: [id number] Date of Birth: 1935/07/07   Medicare Important Message Given:  Yes    Kerin Salen 11/12/2018, 2:44 PM

## 2018-11-12 NOTE — Progress Notes (Signed)
Pt's daughter [name] has selected [facility] from bed offers made. CSW confirmed with admissions- insurance authorization request is pending.  Sharren Bridge, MSW, LCSW Clinical Social Work 11/12/2018 [phone]

## 2018-11-13 ENCOUNTER — Telehealth: Payer: Self-pay | Admitting: Internal Medicine

## 2018-11-13 NOTE — Telephone Encounter (Signed)
Per 12/17 schmesage -pt daughter wants to see what dr. Julien Nordmann says about r./s first.

## 2018-11-14 ENCOUNTER — Telehealth: Payer: Self-pay | Admitting: *Deleted

## 2018-11-14 ENCOUNTER — Inpatient Hospital Stay: Payer: Medicare HMO | Admitting: Internal Medicine

## 2018-11-14 NOTE — Telephone Encounter (Signed)
Pt was on TCM report admitted 10/30/18 for SIRS (systemic inflammatory response syndrome).  Pt states she begin to have worsening shortness of breath in her chest pain. Patient received fluids resuscitation in the ED and IV abx, sob worsened, repeat cxr on 12/4 "Large right pleural effusion with a essentially complete collapse of the right lung. Pt D/c 11/12/18 to SNF with palliative care. Will f/u w/SNF MD in 1 week if no better will consider hospice/comfort measures...Pamela Mcguire

## 2018-11-16 ENCOUNTER — Telehealth: Payer: Self-pay | Admitting: Internal Medicine

## 2018-11-16 NOTE — Telephone Encounter (Signed)
No r/s from 12/18 appt - per MM :  She had a lot of work-up done during her hospitalization. I saw the patient briefly for social visit when she was in the hospital. No urgent follow-up visit is needed. We can see her after she gets discharged from the rehab center. Thank you.    Patients daughter is aware and will give [place] a call back when she is discharged

## 2018-12-06 ENCOUNTER — Other Ambulatory Visit: Payer: Self-pay | Admitting: Internal Medicine

## 2018-12-06 ENCOUNTER — Telehealth: Payer: Self-pay

## 2018-12-06 NOTE — Telephone Encounter (Signed)
Copied from [place] [phone]. Topic: Quick Communication - Home Health Verbal Orders >> Dec 05, 2018  4:08 PM Rayann Heman wrote: Caller/Agency: ronda with [facility] calling to let us know that there will be a delay in starting service. Pt will be seen Saturday the 11th. Juluis Rainier  Callback Number: [phone]

## 2018-12-06 NOTE — Telephone Encounter (Signed)
Is she still in SNF? If so that provider should be giving her medication? This is from hospital and she has not followed up with [place] for labs and should have visit for labs and make sure this is appropriate. Also should not be out until 12/12/18.

## 2018-12-06 NOTE — Telephone Encounter (Signed)
Pls advise on request below.Marland KitchenJohny Mcguire

## 2018-12-06 NOTE — Telephone Encounter (Signed)
Copied from [place] [phone]. Topic: Quick Communication - See Telephone Encounter >> Dec 06, 2018 10:37 AM Conception Chancy, NT wrote: CRM for notification. See Telephone encounter for: 12/06/18.  Patient daughter is calling and states she is needing a refill on potassium chloride SA (K-DUR,KLOR-CON) 20 MEQ tablet  and diltiazem (CARDIZEM CD) 120 MG 24 hr capsule. Please advise.  [facility] [place], [address] AT [facility] OF [place] [address] Phone: [phone] Fax: [phone]

## 2018-12-06 NOTE — Telephone Encounter (Signed)
Requested medication (s) are due for refill today -yes  Requested medication (s) are on the active medication list -yes  Future visit scheduled -yes  Last refill: 11/12/18  Notes to clinic: Patient is requesting medications filled by out side provider- probable hospital provider. Please have PCP review for requested refill.  Requested Prescriptions  Pending Prescriptions Disp Refills   potassium chloride SA (K-DUR,KLOR-CON) 20 MEQ tablet 30 tablet 0    Sig: Take 2 tablets (40 mEq total) by mouth every Monday, Wednesday, and Friday.     Endocrinology:  Minerals - Potassium Supplementation Passed - 12/06/2018 10:41 AM      Passed - K in normal range and within 360 days    Potassium  Date Value Ref Range Status  11/12/2018 3.6 3.5 - 5.1 mmol/L Final  07/04/2017 5.0 3.5 - 5.1 mEq/L Final         Passed - Cr in normal range and within 360 days    Creatinine  Date Value Ref Range Status  05/01/2018 1.02 0.60 - 1.10 mg/dL Final  07/04/2017 1.1 0.6 - 1.1 mg/dL Final   Creatinine, Ser  Date Value Ref Range Status  11/12/2018 0.86 0.44 - 1.00 mg/dL Final         Passed - Valid encounter within last 12 months    Recent Outpatient Visits          2 months ago Acute on chronic diastolic CHF (congestive heart failure) ([place])   [facility] -Chuck Hint, MD   3 months ago Shortness of breath   [place], Marvis Repress, FNP   10 months ago SOB (shortness of breath)   [place], Elizabeth A, MD   1 year ago Malignant neoplasm of lung, unspecified laterality, unspecified part of lung ([place])   [place], Elizabeth A, MD   1 year ago Pleural effusion   [place], FNP      Future Appointments            In 5 days Hoyt Koch, MD [place], [facility]   In 1 week Hilty,  Nadean Corwin, MD [facility] [place], [facility]          diltiazem (CARDIZEM CD) 120 MG 24 hr capsule 30 capsule 0    Sig: Take 1 capsule (120 mg total) by mouth daily.     Off-Protocol Failed - 12/06/2018 10:41 AM      Failed - Medication not assigned to a protocol, review manually.      Passed - Valid encounter within last 12 months    Recent Outpatient Visits          2 months ago Acute on chronic diastolic CHF (congestive heart failure) ([place])   [place], Elizabeth A, MD   3 months ago Shortness of breath   [place], Marvis Repress, FNP   10 months ago SOB (shortness of breath)   [place], Elizabeth A, MD   1 year ago Malignant neoplasm of lung, unspecified laterality, unspecified part of lung ([place])   [place], Elizabeth A, MD   1 year ago Pleural effusion   [place], FNP      Future Appointments  In 5 days Hoyt Koch, MD [place], [place]   In 1 week [place], Nadean Corwin, MD [facility] [place], [facility]            Requested Prescriptions  Pending Prescriptions Disp Refills   potassium chloride SA (K-DUR,KLOR-CON) 20 MEQ tablet 30 tablet 0    Sig: Take 2 tablets (40 mEq total) by mouth every Monday, Wednesday, and Friday.     Endocrinology:  Minerals - Potassium Supplementation Passed - 12/06/2018 10:41 AM      Passed - K in normal range and within 360 days    Potassium  Date Value Ref Range Status  11/12/2018 3.6 3.5 - 5.1 mmol/L Final  07/04/2017 5.0 3.5 - 5.1 mEq/L Final         Passed - Cr in normal range and within 360 days    Creatinine  Date Value Ref Range Status  05/01/2018 1.02 0.60 - 1.10 mg/dL Final  07/04/2017 1.1 0.6 - 1.1 mg/dL Final   Creatinine, Ser  Date Value Ref Range Status  11/12/2018 0.86 0.44 -  1.00 mg/dL Final         Passed - Valid encounter within last 12 months    Recent Outpatient Visits          2 months ago Acute on chronic diastolic CHF (congestive heart failure) ([place])   [facility] -Chuck Hint, MD   3 months ago Shortness of breath   [place], Marvis Repress, FNP   10 months ago SOB (shortness of breath)   [place], Elizabeth A, MD   1 year ago Malignant neoplasm of lung, unspecified laterality, unspecified part of lung ([place])   [place], Elizabeth A, MD   1 year ago Pleural effusion   [place], FNP      Future Appointments            In 5 days Hoyt Koch, MD [place], [facility]   In 1 week Hilty, Nadean Corwin, MD [facility], [facility]          diltiazem (CARDIZEM CD) 120 MG 24 hr capsule 30 capsule 0    Sig: Take 1 capsule (120 mg total) by mouth daily.     Off-Protocol Failed - 12/06/2018 10:41 AM      Failed - Medication not assigned to a protocol, review manually.      Passed - Valid encounter within last 12 months    Recent Outpatient Visits          2 months ago Acute on chronic diastolic CHF (congestive heart failure) ([place])   [place], Elizabeth A, MD   3 months ago Shortness of breath    [place], Marvis Repress, FNP   10 months ago SOB (shortness of breath)   [place], Elizabeth A, MD   1 year ago Malignant neoplasm of lung, unspecified laterality, unspecified part of lung ([place])   [facility] -Chuck Hint, MD   1 year ago Pleural effusion   [place], FNP      Future Appointments            In 5 days Sharlet [place] Real Cons, MD [place], [facility]  In 1 week Hilty, Nadean Corwin, MD [facility] [place], [facility]

## 2018-12-06 NOTE — Telephone Encounter (Signed)
fyi

## 2018-12-07 ENCOUNTER — Other Ambulatory Visit: Payer: Self-pay | Admitting: Internal Medicine

## 2018-12-07 ENCOUNTER — Telehealth: Payer: Self-pay | Admitting: Internal Medicine

## 2018-12-07 MED ORDER — DILTIAZEM HCL ER COATED BEADS 120 MG PO CP24: 120.0000 mg | ORAL_CAPSULE | Freq: Every day | ORAL | 0 refills | Status: AC

## 2018-12-07 MED ORDER — POTASSIUM CHLORIDE CRYS ER 20 MEQ PO TBCR: 40.0000 meq | EXTENDED_RELEASE_TABLET | ORAL | 0 refills | Status: AC

## 2018-12-07 NOTE — Telephone Encounter (Signed)
Called pt/daughter (Pamela Mcguire) she states mom was release from [facility] on Wednesday. They have already called and made follow-up appt for 12/11/18. They did not give mom rx's for the Diltiazem & her Potassium. Would like [facility]. Also went ahead and completed TCM below.Pamela Mcguire  Transition Care Management Follow-up Telephone Call   Date discharged? 12/05/18   How have you been since you were released from the hospital? Per daughter she states mom is doing ok. She is currently staying at her sisters house until she come in for her follow-up appt    Do you understand why you were in the hospital? YES   Do you understand the discharge instructions? YES   Where were you discharged to? Home   Items Reviewed:  Medications reviewed: YES, but needing refill on the Diltiazem & Potassium  Allergies reviewed: YES  Dietary changes reviewed: YES, heart healthy  Referrals reviewed: No referral needed   Functional Questionnaire:   Activities of Daily Living (ADLs):   She states mom are independent in the following: bathing and hygiene, feeding, continence, grooming and toileting States they require assistance with the following: ambulation and dressing   Any transportation issues/concerns?: NO   Any patient concerns? NO, just needing refills   Confirmed importance and date/time of follow-up visits scheduled YES, appt 12/11/18  Provider Appointment booked with Dr. Sharlet Mcguire   Confirmed with patient if condition begins to worsen call PCP or go to the ER.  Patient was given the office number and encouraged to call back with question or concerns.  : YES

## 2018-12-07 NOTE — Telephone Encounter (Signed)
Copied from [place] [phone]. Topic: Quick Communication - Home Health Verbal Orders >> Dec 07, 2018  4:51 PM Blase Mess A wrote: Caller/Agency: David/[facility] Callback Number: [phone] to leave verbal on voice mail Requesting OT/PT/Skilled Nursing/Social Work: Continuing PT Frequency: 1 week 1, 2 week 4, 1 week 1 for gate transfers, strength, balance, endurance

## 2018-12-10 NOTE — Telephone Encounter (Signed)
Noted  

## 2018-12-10 NOTE — Telephone Encounter (Signed)
Called david and informed of MD response.  Can you please schedule patient for appointment in order to get verbals. Thank you

## 2018-12-10 NOTE — Telephone Encounter (Signed)
Patient is scheduled to be seen tomorrow, 12/11/2017.

## 2018-12-10 NOTE — Telephone Encounter (Signed)
We cannot given as she did not come for hospital follow up and we need face to face within 30 days of start date for home health. Schedule visit.

## 2018-12-11 ENCOUNTER — Encounter: Payer: Self-pay | Admitting: Internal Medicine

## 2018-12-11 ENCOUNTER — Ambulatory Visit (INDEPENDENT_AMBULATORY_CARE_PROVIDER_SITE_OTHER): Payer: Medicare HMO | Admitting: Internal Medicine

## 2018-12-11 VITALS — BP 100/60 | HR 89 | Temp 97.9°F | Ht 66.0 in | Wt 143.0 lb

## 2018-12-11 DIAGNOSIS — Z23 Encounter for immunization: Secondary | ICD-10-CM

## 2018-12-11 DIAGNOSIS — J9601 Acute respiratory failure with hypoxia: Secondary | ICD-10-CM

## 2018-12-11 DIAGNOSIS — J948 Other specified pleural conditions: Secondary | ICD-10-CM

## 2018-12-11 DIAGNOSIS — R0602 Shortness of breath: Secondary | ICD-10-CM | POA: Diagnosis not present

## 2018-12-11 DIAGNOSIS — I639 Cerebral infarction, unspecified: Secondary | ICD-10-CM

## 2018-12-11 MED ORDER — HYDROCODONE-HOMATROPINE 5-1.5 MG/5ML PO SYRP: 5.0000 mL | ORAL_SOLUTION | ORAL | 0 refills | Status: AC | PRN

## 2018-12-11 NOTE — Patient Instructions (Addendum)
We will get palliative care coming out to the house to check on you.   We have sent in cough medicine that you can use for cough or for shortness of breath. Use 5 mL up to every 4 hours. This can cause sleepiness. This medicine can cause constipation so if you are getting constipated let us know and we can give instructions on what to use for this.

## 2018-12-11 NOTE — Progress Notes (Signed)
   Subjective:   Patient ID: Pamela Mcguire, female    DOB: 01-24-1935, 84 y.o.   MRN: [id number]  HPI The patient is an 84 YO female coming in for hospital follow up (in for hypoxia and recurrent pleural effusion which was drained and diuresis as well as infection). She is coughing some in the last day or so. Some sputum. Denies fevers. She is on oxygen all the time. She is very tired. Not able to walk around much. Not able to do her ADLs. She was in independent house previous and now is staying with her sister. Her daughter is present with her and gives history. They talked to hospice at the SNF where she stayed for 2 weeks after leaving the hospital. She has been home 1-2 weeks now. No real decline but not much progress in function. She wants the flu shot today and is not sure how it got on her allergy list. Using nebulizer 1-2 times per day and it is not helping much. She feels SOB most of the time.   PMH, [facility], social history reviewed  Review of Systems  Constitutional: Positive for activity change, appetite change and fatigue.  HENT: Negative.   Eyes: Negative.   Respiratory: Positive for cough, shortness of breath and wheezing. Negative for chest tightness.   Cardiovascular: Negative for chest pain, palpitations and leg swelling.  Gastrointestinal: Negative for abdominal distention, abdominal pain, constipation, diarrhea, nausea and vomiting.  Musculoskeletal: Negative.   Skin: Negative.   Neurological: Positive for weakness.  Psychiatric/Behavioral: Negative.     Objective:  Physical Exam Constitutional:      Appearance: She is well-developed. She is ill-appearing.  HENT:     Head: Normocephalic and atraumatic.  Neck:     Musculoskeletal: Normal range of motion.  Cardiovascular:     Rate and Rhythm: Normal rate and regular rhythm.  Pulmonary:     Effort: Pulmonary effort is normal. No respiratory distress.     Breath sounds: No rales.     Comments: Very shallow and rapid  breathing, can talk in short sentences Abdominal:     General: Bowel sounds are normal. There is no distension.     Palpations: Abdomen is soft.     Tenderness: There is no abdominal tenderness. There is no rebound.  Skin:    General: Skin is warm and dry.  Neurological:     Mental Status: She is alert and oriented to person, place, and time.     Coordination: Coordination abnormal.     Comments: wheelchair     Vitals:   12/11/18 1408  BP: 100/60  Pulse: 89  Temp: 97.9 F (36.6 C)  TempSrc: Oral  SpO2: 98%  Weight: 143 lb (64.9 kg)  Height: 5\' 6"  (1.676 m)    Assessment & Plan:  Flu shot given at visit

## 2018-12-12 ENCOUNTER — Encounter (HOSPITAL_COMMUNITY): Payer: Self-pay | Admitting: Emergency Medicine

## 2018-12-12 ENCOUNTER — Emergency Department (HOSPITAL_COMMUNITY): Payer: Medicare HMO

## 2018-12-12 ENCOUNTER — Observation Stay (HOSPITAL_COMMUNITY)
Admission: EM | Admit: 2018-12-12 | Discharge: 2018-12-16 | Disposition: A | Discharge status: Home / Self Care | Payer: Medicare HMO | Attending: Internal Medicine | Admitting: Internal Medicine

## 2018-12-12 ENCOUNTER — Telehealth: Payer: Self-pay

## 2018-12-12 ENCOUNTER — Ambulatory Visit: Payer: Self-pay

## 2018-12-12 DIAGNOSIS — W19XXXA Unspecified fall, initial encounter: Secondary | ICD-10-CM | POA: Diagnosis not present

## 2018-12-12 DIAGNOSIS — I272 Pulmonary hypertension, unspecified: Secondary | ICD-10-CM | POA: Insufficient documentation

## 2018-12-12 DIAGNOSIS — C50911 Malignant neoplasm of unspecified site of right female breast: Secondary | ICD-10-CM | POA: Insufficient documentation

## 2018-12-12 DIAGNOSIS — I130 Hypertensive heart and chronic kidney disease with heart failure and stage 1 through stage 4 chronic kidney disease, or unspecified chronic kidney disease: Secondary | ICD-10-CM | POA: Diagnosis not present

## 2018-12-12 DIAGNOSIS — J9611 Chronic respiratory failure with hypoxia: Secondary | ICD-10-CM | POA: Diagnosis not present

## 2018-12-12 DIAGNOSIS — Z7901 Long term (current) use of anticoagulants: Secondary | ICD-10-CM | POA: Insufficient documentation

## 2018-12-12 DIAGNOSIS — Z7189 Other specified counseling: Secondary | ICD-10-CM

## 2018-12-12 DIAGNOSIS — Z87891 Personal history of nicotine dependence: Secondary | ICD-10-CM | POA: Insufficient documentation

## 2018-12-12 DIAGNOSIS — R55 Syncope and collapse: Principal | ICD-10-CM

## 2018-12-12 DIAGNOSIS — Z66 Do not resuscitate: Secondary | ICD-10-CM | POA: Insufficient documentation

## 2018-12-12 DIAGNOSIS — I340 Nonrheumatic mitral (valve) insufficiency: Secondary | ICD-10-CM | POA: Insufficient documentation

## 2018-12-12 DIAGNOSIS — K219 Gastro-esophageal reflux disease without esophagitis: Secondary | ICD-10-CM | POA: Insufficient documentation

## 2018-12-12 DIAGNOSIS — R000 Tachycardia, unspecified: Secondary | ICD-10-CM | POA: Insufficient documentation

## 2018-12-12 DIAGNOSIS — I4891 Unspecified atrial fibrillation: Secondary | ICD-10-CM

## 2018-12-12 DIAGNOSIS — Z923 Personal history of irradiation: Secondary | ICD-10-CM | POA: Insufficient documentation

## 2018-12-12 DIAGNOSIS — S0003XA Contusion of scalp, initial encounter: Secondary | ICD-10-CM | POA: Diagnosis not present

## 2018-12-12 DIAGNOSIS — I5032 Chronic diastolic (congestive) heart failure: Secondary | ICD-10-CM | POA: Insufficient documentation

## 2018-12-12 DIAGNOSIS — C189 Malignant neoplasm of colon, unspecified: Secondary | ICD-10-CM | POA: Insufficient documentation

## 2018-12-12 DIAGNOSIS — T07XXXA Unspecified multiple injuries, initial encounter: Secondary | ICD-10-CM

## 2018-12-12 DIAGNOSIS — C3411 Malignant neoplasm of upper lobe, right bronchus or lung: Secondary | ICD-10-CM

## 2018-12-12 DIAGNOSIS — E876 Hypokalemia: Secondary | ICD-10-CM | POA: Insufficient documentation

## 2018-12-12 DIAGNOSIS — C3490 Malignant neoplasm of unspecified part of unspecified bronchus or lung: Secondary | ICD-10-CM | POA: Diagnosis not present

## 2018-12-12 DIAGNOSIS — R7303 Prediabetes: Secondary | ICD-10-CM | POA: Insufficient documentation

## 2018-12-12 DIAGNOSIS — F329 Major depressive disorder, single episode, unspecified: Secondary | ICD-10-CM | POA: Insufficient documentation

## 2018-12-12 DIAGNOSIS — I480 Paroxysmal atrial fibrillation: Secondary | ICD-10-CM | POA: Insufficient documentation

## 2018-12-12 DIAGNOSIS — M25462 Effusion, left knee: Secondary | ICD-10-CM | POA: Insufficient documentation

## 2018-12-12 DIAGNOSIS — Z9981 Dependence on supplemental oxygen: Secondary | ICD-10-CM | POA: Diagnosis not present

## 2018-12-12 DIAGNOSIS — M25562 Pain in left knee: Secondary | ICD-10-CM | POA: Diagnosis not present

## 2018-12-12 DIAGNOSIS — Z79899 Other long term (current) drug therapy: Secondary | ICD-10-CM | POA: Insufficient documentation

## 2018-12-12 DIAGNOSIS — R2689 Other abnormalities of gait and mobility: Secondary | ICD-10-CM | POA: Insufficient documentation

## 2018-12-12 DIAGNOSIS — M25559 Pain in unspecified hip: Secondary | ICD-10-CM

## 2018-12-12 DIAGNOSIS — M25552 Pain in left hip: Secondary | ICD-10-CM | POA: Insufficient documentation

## 2018-12-12 DIAGNOSIS — I2510 Atherosclerotic heart disease of native coronary artery without angina pectoris: Secondary | ICD-10-CM | POA: Insufficient documentation

## 2018-12-12 DIAGNOSIS — N183 Chronic kidney disease, stage 3 (moderate): Secondary | ICD-10-CM | POA: Insufficient documentation

## 2018-12-12 DIAGNOSIS — E785 Hyperlipidemia, unspecified: Secondary | ICD-10-CM | POA: Diagnosis not present

## 2018-12-12 DIAGNOSIS — R9431 Abnormal electrocardiogram [ECG] [EKG]: Secondary | ICD-10-CM | POA: Diagnosis present

## 2018-12-12 DIAGNOSIS — F0390 Unspecified dementia without behavioral disturbance: Secondary | ICD-10-CM | POA: Insufficient documentation

## 2018-12-12 DIAGNOSIS — F419 Anxiety disorder, unspecified: Secondary | ICD-10-CM | POA: Insufficient documentation

## 2018-12-12 DIAGNOSIS — R0600 Dyspnea, unspecified: Secondary | ICD-10-CM

## 2018-12-12 DIAGNOSIS — M25569 Pain in unspecified knee: Secondary | ICD-10-CM

## 2018-12-12 DIAGNOSIS — I249 Acute ischemic heart disease, unspecified: Secondary | ICD-10-CM | POA: Insufficient documentation

## 2018-12-12 DIAGNOSIS — J90 Pleural effusion, not elsewhere classified: Secondary | ICD-10-CM | POA: Diagnosis not present

## 2018-12-12 DIAGNOSIS — I10 Essential (primary) hypertension: Secondary | ICD-10-CM

## 2018-12-12 DIAGNOSIS — Z515 Encounter for palliative care: Secondary | ICD-10-CM

## 2018-12-12 LAB — CBC WITH DIFFERENTIAL/PLATELET
Abs Immature Granulocytes: 0.05 10*3/uL (ref 0.00–0.07)
BASOS PCT: 0 %
Basophils Absolute: 0 10*3/uL (ref 0.0–0.1)
Eosinophils Absolute: 0 10*3/uL (ref 0.0–0.5)
Eosinophils Relative: 0 %
HCT: 36 % (ref 36.0–46.0)
Hemoglobin: 11.8 g/dL — ABNORMAL LOW (ref 12.0–15.0)
Immature Granulocytes: 1 %
Lymphocytes Relative: 11 %
Lymphs Abs: 0.9 10*3/uL (ref 0.7–4.0)
MCH: 29.8 pg (ref 26.0–34.0)
MCHC: 32.8 g/dL (ref 30.0–36.0)
MCV: 90.9 fL (ref 80.0–100.0)
Monocytes Absolute: 0.4 10*3/uL (ref 0.1–1.0)
Monocytes Relative: 5 %
NEUTROS ABS: 7 10*3/uL (ref 1.7–7.7)
Neutrophils Relative %: 83 %
Platelets: 307 10*3/uL (ref 150–400)
RBC: 3.96 MIL/uL (ref 3.87–5.11)
RDW: 14 % (ref 11.5–15.5)
WBC: 8.4 10*3/uL (ref 4.0–10.5)
nRBC: 0 % (ref 0.0–0.2)

## 2018-12-12 LAB — BASIC METABOLIC PANEL
Anion gap: 14 (ref 5–15)
BUN: 11 mg/dL (ref 8–23)
CO2: 28 mmol/L (ref 22–32)
Calcium: 8.2 mg/dL — ABNORMAL LOW (ref 8.9–10.3)
Chloride: 93 mmol/L — ABNORMAL LOW (ref 98–111)
Creatinine, Ser: 1.09 mg/dL — ABNORMAL HIGH (ref 0.44–1.00)
GFR calc Af Amer: 55 mL/min — ABNORMAL LOW (ref >60)
GFR, EST NON AFRICAN AMERICAN: 48 mL/min — AB (ref >60)
Glucose, Bld: 106 mg/dL — ABNORMAL HIGH (ref 70–99)
POTASSIUM: 4 mmol/L (ref 3.5–5.1)
Sodium: 135 mmol/L (ref 135–145)

## 2018-12-12 LAB — URINALYSIS, MICROSCOPIC (REFLEX)

## 2018-12-12 LAB — URINALYSIS, ROUTINE W REFLEX MICROSCOPIC
Bilirubin Urine: NEGATIVE
Glucose, UA: NEGATIVE mg/dL
Ketones, ur: NEGATIVE mg/dL
Leukocytes, UA: NEGATIVE
Nitrite: NEGATIVE
PROTEIN: NEGATIVE mg/dL
Specific Gravity, Urine: 1.01 (ref 1.005–1.030)
pH: 8.5 — ABNORMAL HIGH (ref 5.0–8.0)

## 2018-12-12 LAB — I-STAT TROPONIN, ED: Troponin i, poc: 0.02 ng/mL (ref 0.00–0.08)

## 2018-12-12 LAB — DIGOXIN LEVEL: Digoxin Level: 0.9 ng/mL (ref 0.8–2.0)

## 2018-12-12 LAB — TROPONIN I: Troponin I: 0.04 ng/mL (ref <0.03)

## 2018-12-12 LAB — PROTIME-INR
INR: 1.62
Prothrombin Time: 19.1 seconds — ABNORMAL HIGH (ref 11.4–15.2)

## 2018-12-12 MED ORDER — IPRATROPIUM-ALBUTEROL 0.5-2.5 (3) MG/3ML IN SOLN: 3.0000 mL | Freq: Four times a day (QID) | RESPIRATORY_TRACT | Status: DC | PRN

## 2018-12-12 MED ORDER — GUAIFENESIN ER 600 MG PO TB12
600.0000 mg | ORAL_TABLET | Freq: Two times a day (BID) | ORAL | Status: DC
  Administered 2018-12-12 – 2018-12-16 (×8): 600 mg via ORAL
  Filled 2018-12-12 (×8): qty 1

## 2018-12-12 MED ORDER — ACETAMINOPHEN 325 MG PO TABS: 650.0000 mg | ORAL_TABLET | Freq: Four times a day (QID) | ORAL | Status: DC | PRN

## 2018-12-12 MED ORDER — DIGOXIN 125 MCG PO TABS
0.1250 mg | ORAL_TABLET | Freq: Every day | ORAL | Status: DC
  Administered 2018-12-13 – 2018-12-16 (×4): 0.125 mg via ORAL
  Filled 2018-12-12 (×4): qty 1

## 2018-12-12 MED ORDER — DILTIAZEM HCL ER COATED BEADS 120 MG PO CP24
120.0000 mg | ORAL_CAPSULE | Freq: Every day | ORAL | Status: DC
  Administered 2018-12-14 – 2018-12-16 (×3): 120 mg via ORAL
  Filled 2018-12-12 (×4): qty 1

## 2018-12-12 MED ORDER — APIXABAN 5 MG PO TABS
5.0000 mg | ORAL_TABLET | Freq: Two times a day (BID) | ORAL | Status: DC
  Administered 2018-12-12 – 2018-12-16 (×8): 5 mg via ORAL
  Filled 2018-12-12 (×9): qty 1

## 2018-12-12 MED ORDER — HYDROCODONE-HOMATROPINE 5-1.5 MG/5ML PO SYRP: 5.0000 mL | ORAL_SOLUTION | ORAL | Status: DC | PRN

## 2018-12-12 MED ORDER — FESOTERODINE FUMARATE ER 4 MG PO TB24
4.0000 mg | ORAL_TABLET | Freq: Every day | ORAL | Status: DC
  Administered 2018-12-12 – 2018-12-16 (×5): 4 mg via ORAL
  Filled 2018-12-12 (×5): qty 1

## 2018-12-12 MED ORDER — BOOST PO LIQD
237.0000 mL | Freq: Every day | ORAL | Status: DC | PRN
  Filled 2018-12-12: qty 237

## 2018-12-12 MED ORDER — ACETAMINOPHEN 650 MG RE SUPP: 650.0000 mg | Freq: Four times a day (QID) | RECTAL | Status: DC | PRN

## 2018-12-12 MED ORDER — ADULT MULTIVITAMIN W/MINERALS CH
1.0000 | ORAL_TABLET | Freq: Every day | ORAL | Status: DC
  Administered 2018-12-13 – 2018-12-16 (×4): 1 via ORAL
  Filled 2018-12-12 (×4): qty 1

## 2018-12-12 MED ORDER — MIRTAZAPINE 15 MG PO TABS
15.0000 mg | ORAL_TABLET | Freq: Every day | ORAL | Status: DC
  Administered 2018-12-12 – 2018-12-15 (×4): 15 mg via ORAL
  Filled 2018-12-12 (×5): qty 1

## 2018-12-12 MED ORDER — SERTRALINE HCL 50 MG PO TABS
50.0000 mg | ORAL_TABLET | Freq: Every day | ORAL | Status: DC
  Administered 2018-12-13 – 2018-12-16 (×4): 50 mg via ORAL
  Filled 2018-12-12 (×4): qty 1

## 2018-12-12 MED ORDER — FERROUS SULFATE 325 (65 FE) MG PO TABS
325.0000 mg | ORAL_TABLET | Freq: Every evening | ORAL | Status: DC
  Administered 2018-12-13 – 2018-12-15 (×4): 325 mg via ORAL
  Filled 2018-12-12 (×4): qty 1

## 2018-12-12 MED ORDER — ACETAMINOPHEN 500 MG PO TABS
1000.0000 mg | ORAL_TABLET | Freq: Three times a day (TID) | ORAL | Status: DC | PRN
  Administered 2018-12-12 – 2018-12-14 (×4): 1000 mg via ORAL
  Filled 2018-12-12 (×5): qty 2

## 2018-12-12 MED ORDER — PANTOPRAZOLE SODIUM 40 MG PO TBEC
40.0000 mg | DELAYED_RELEASE_TABLET | Freq: Every day | ORAL | Status: DC
  Administered 2018-12-13 – 2018-12-16 (×4): 40 mg via ORAL
  Filled 2018-12-12: qty 1
  Filled 2018-12-12: qty 2
  Filled 2018-12-12 (×2): qty 1

## 2018-12-12 MED ORDER — LORATADINE 10 MG PO TABS
10.0000 mg | ORAL_TABLET | Freq: Every day | ORAL | Status: DC
  Administered 2018-12-12 – 2018-12-15 (×4): 10 mg via ORAL
  Filled 2018-12-12 (×4): qty 1

## 2018-12-12 MED ORDER — SODIUM CHLORIDE 0.9 % IV SOLN
INTRAVENOUS | Status: AC
  Administered 2018-12-13: via INTRAVENOUS

## 2018-12-12 MED ORDER — ATORVASTATIN CALCIUM 40 MG PO TABS
40.0000 mg | ORAL_TABLET | Freq: Every day | ORAL | Status: DC
  Administered 2018-12-13 – 2018-12-16 (×4): 40 mg via ORAL
  Filled 2018-12-12 (×4): qty 1

## 2018-12-12 NOTE — ED Provider Notes (Signed)
[facility] Provider Note   CSN: [id number] Arrival date & time: 12/12/18  1408     History   Chief Complaint Chief Complaint  Patient presents with  . Near Syncope  . Head Injury    HPI Pamela Mcguire is a 84 y.o. female.  HPI Patient had gotten up to meet a caregiver.  She had been in bed.  She was using her walker.  She reports she got dizzy or lost her balance and fell.  Her son-in-law is here with her.  He reports that he was with her at the time and going to let the caretaker him when he heard her fall.  He reports away she is lying, he thinks that she hit her head on the door frame.  Patient reports that most of her pain is on the left side of the back of her chest her lower hip and knee.  She does take Eliquis for atrial fibrillation.  He denies that she has a generalized headache.  No blurred vision.  No weakness or numbness or tingling to the extremities.  Patient has been hospitalized within the past month.  She did have recurrent pleural effusions that required thoracentesis without specific etiology. Past Medical History:  Diagnosis Date  . AF (atrial fibrillation) ([place])   . Anxiety   . Arthritis    "thighs, hips, arms" (04/28/2015)  . Breast cancer ([place])    right mastectomy  . CAD (coronary artery disease)   . Dehydration 12/06/2016  . Dementia ([place])   . Depression   . Diverticulitis   . GERD (gastroesophageal reflux disease)   . HCAP (healthcare-associated pneumonia) 09/15/2015  . Hyperlipidemia   . Hypertension   . Non-small cell carcinoma of lung, stage 3 ([place]) 04/30/2015  . Prediabetes   . Seasonal allergies   . Shortness of breath dyspnea   . Sleep apnea     dx'd 10/2014,refused cpap machine  . Urinary frequency   . UTI (urinary tract infection) 06/06/2015    Patient Active Problem List   Diagnosis Date Noted  . Non-small cell lung cancer, right ([place])   . Acute CVA (cerebrovascular accident) ([place])   . Acute  respiratory failure with hypoxia ([place])   . Chronic respiratory failure with hypoxia ([place]), 2L home O2 08/29/2018  . SOB (shortness of breath) 08/29/2018  . Acute on chronic diastolic CHF (congestive heart failure) ([place]) 08/29/2018  . COPD exacerbation ([place]) 08/26/2017  . Pleural effusion transudative 08/22/2017  . Medicare annual wellness visit, subsequent 07/10/2017  . Encounter for general adult medical examination with abnormal findings 07/10/2017  . Insomnia due to medical condition 04/18/2017  . Blurred vision 04/18/2017  . Cancer of ascending colon s/p lap colectomy 02/08/2017 02/08/2017  . Iron deficiency anemia   . Fundic gland polyps of stomach, benign   . Fracture of L1 vertebra ([place]) 10/19/2015  . Weakness   . Compression fracture 09/13/2015  . Mobility impaired   . Fatigue 08/18/2015  . Major depression 08/18/2015  . Frequent stools 08/18/2015  . Tachypnea 06/22/2015  . Adenocarcinoma of upper lobe of right lung 04/30/2015  . Mediastinal adenopathy 04/24/2015  . Severe obstructive sleep apnea 03/27/2015  . Lung mass 03/27/2015  . Folate deficiency 09/21/2014  . Paroxysmal a-fib ([place]) 05/27/2014  . Essential hypertension 05/27/2014  . Breast cancer ([place]) 04/17/2014  . Prediabetes 04/17/2014  . CAD (coronary artery disease) 04/16/2014  . Hyperlipidemia 04/16/2014    Past Surgical History:  Procedure Laterality Date  .  BREAST SURGERY    . CARDIOVERSION    . Cataract surgery    . CESAREAN SECTION  X 2  . COLON SURGERY    . COLONOSCOPY    . COLONOSCOPY N/A 01/13/2017   Procedure: COLONOSCOPY;  Surgeon: Gatha Mayer, MD;  Location: [facility] END[facility]COPY;  Service: Endoscopy;  Laterality: N/A;  . ESOPHAGOGASTRODUODENOSCOPY N/A 01/13/2017   Procedure: ESOPHAGOGASTRODUODENOSCOPY (EGD);  Surgeon: Gatha Mayer, MD;  Location: [facility];  Service: Endoscopy;  Laterality: N/A;  . LAPAROSCOPIC PARTIAL COLECTOMY N/A 02/08/2017   Procedure: LAPAROSCOPIC PARTIAL COLECTOMY;   Surgeon: Leighton Ruff, MD;  Location: [facility] ORS;  Service: General;  Laterality: N/A;  . MASTECTOMY Right   . MEDIASTINOSCOPY N/A 04/28/2015   Procedure: MEDIASTINOSCOPY;  Surgeon: [name], MD;  Location: [facility] OR;  Service: Thoracic;  Laterality: N/A;  . TONSILLECTOMY       OB History   No obstetric history on file.      Home Medications    Prior to Admission medications   Medication Sig Start Date End Date Taking? Authorizing Provider  acetaminophen (TYLENOL) 325 MG tablet Take 2 tablets (650 mg total) by mouth every 6 (six) hours as needed for mild pain (or Fever >/= 101). 11/12/18   Florencia Reasons, MD  albuterol (PROVENTIL HFA;VENTOLIN HFA) 108 (90 Base) MCG/ACT inhaler Inhale 1 puff into the lungs every 6 (six) hours as needed for wheezing or shortness of breath. Reported on 06/01/2016 11/15/16   Golden Circle, FNP  apixaban (ELIQUIS) 5 MG TABS tablet Take 1 tablet (5 mg total) by mouth 2 (two) times daily. 10/08/18   Pixie Casino, MD  atorvastatin (LIPITOR) 40 MG tablet TAKE 1 TABLET(40 MG) BY MOUTH DAILY 12/07/18   Hoyt Koch, MD  DIGOX 125 MCG tablet TAKE 1 TABLET(0.125 MG) BY MOUTH DAILY Patient taking differently: Take 0.125 mg by mouth daily.  02/12/18   Hilty, Nadean Corwin, MD  diltiazem (CARDIZEM CD) 120 MG 24 hr capsule Take 1 capsule (120 mg total) by mouth daily. 12/07/18   Hoyt Koch, MD  ferrous sulfate 325 (65 FE) MG tablet Take 325 mg by mouth every evening.     [provider]  furosemide (LASIX) 80 MG tablet Take 1 tablet (80 mg total) by mouth 2 (two) times daily. 11/12/18   Florencia Reasons, MD  guaiFENesin (MUCINEX) 600 MG 12 hr tablet Take 1 tablet (600 mg total) by mouth 2 (two) times daily. 11/12/18 11/12/19  Florencia Reasons, MD  HYDROcodone-homatropine [facility]) 5-1.5 MG/5ML syrup Take 5 mLs by mouth every 4 (four) hours as needed for cough (SOB). 12/11/18   Hoyt Koch, MD  ipratropium-albuterol (DUONEB) 0.5-2.5 (3) MG/3ML SOLN Take 3 mLs  by nebulization 4 (four) times daily as needed. Patient taking differently: Take 3 mLs by nebulization daily as needed (shortness of breath).  09/19/17   Hoyt Koch, MD  loratadine (CLARITIN) 10 MG tablet TAKE 1 TABLET BY MOUTH AT BEDTIME 12/07/18   Hoyt Koch, MD  magnesium gluconate (MAGONATE) 30 MG tablet Take 1 tablet (30 mg total) by mouth daily. 11/12/18   Florencia Reasons, MD  mirtazapine (REMERON) 15 MG tablet TAKE 1 TABLET(15 MG) BY MOUTH AT BEDTIME 12/07/18   Hoyt Koch, MD  Multiple Vitamins-Minerals (MULTIVITAMIN WITH MINERALS) tablet Take 1 tablet by mouth daily. gummie    [provider]  NUTRITIONAL SUPPLEMENT LIQD Take 120 mLs by mouth daily as needed (hydration). [employer]     [provider]  OXYGEN Inhale 2 L into the lungs daily.     [provider]  pantoprazole (PROTONIX) 40 MG tablet TAKE 1 TABLET(40 MG) BY MOUTH DAILY Patient taking differently: Take 40 mg by mouth daily.  10/08/18   Hoyt Koch, MD  potassium chloride SA (K-DUR,KLOR-CON) 20 MEQ tablet Take 2 tablets (40 mEq total) by mouth every Monday, Wednesday, and Friday. 12/07/18   Hoyt Koch, MD  sertraline (ZOLOFT) 25 MG tablet Take 1-2 tablets (25-50 mg total) by mouth daily. Patient taking differently: Take 50 mg by mouth daily.  09/12/18   Hoyt Koch, MD  tolterodine (DETROL LA) 4 MG 24 hr capsule Take 1 capsule (4 mg total) by mouth daily. 06/06/18   Hoyt Koch, MD    Family History Family History  Problem Relation Age of Onset  . Mental illness Mother     Social History Social History   Tobacco Use  . Smoking status: Former Smoker    Packs/day: 1.00    Years: 35.00    Pack years: 35.00    Types: Cigarettes    Last attempt to quit: 11/28/1992    Years since quitting: 26.0  . Smokeless tobacco: Never Used  . Tobacco comment: "stopped smoking at age 77"  Substance Use Topics  . Alcohol use: Yes    Alcohol/week:  9.0 standard drinks    Types: 9 Glasses of wine per week    Comment: 04/28/2015 "drink 3, 6oz,  glasses of wine on Sat & Sun"  . Drug use: No     Allergies   Patient has no active allergies.   Review of Systems Review of Systems 10 Systems reviewed and are negative for acute change except as noted in the HPI.  Physical Exam Updated Vital Signs BP (!) 101/52   Pulse (!) 107   Resp (!) 42   Wt 64.8 kg   LMP  (LMP Unknown)   SpO2 100%   BMI 23.06 kg/m   Physical Exam Constitutional:      Comments: Patient is alert with clear mental status.  She is mildly anxious in appearance.  She is wearing home nasal cannula oxygen.  Slightly tachypneic but not severe respiratory distress.  HENT:     Head:     Comments: 2 cm hematoma to the left parietal scalp without any associated abrasion or laceration.  No other facial or head trauma.    Nose: Nose normal.  Eyes:     Extraocular Movements: Extraocular movements intact.     Pupils: Pupils are equal, round, and reactive to light.  Neck:     Comments: C-spine nontender to palpation. Cardiovascular:     Comments: Irregularly irregular.  Tachycardia on the monitor approximately 110. Pulmonary:     Comments: Patient is mildly tachypneic.  Speaking in full sentences.  Breath sounds are soft in the lower lung fields.  No gross wheeze or rale. Abdominal:     General: There is no distension.     Palpations: Abdomen is soft.     Tenderness: There is no abdominal tenderness.  Musculoskeletal:     Comments: No contusions or abrasions to the back.  Patient denies tenderness to palpation of the bony prominences of the vertebral column.  Subtle erythema to the mid back on the left.  Patient is not tender in this area.  No tenderness over the sacrum.  Patient endorses some tenderness over the lower aspect of the trochanter on the left.  No visible  contusion or abrasion.  Minor abrasion over the left knee.  Range of motion intact without any effusion.   Patient versus tenderness of the left knee.  I can put the patient through flexion and extension of the left lower extremity most discomfort is perceivable to the patient in the knee.  Good range of motion of the hip.  Patient has a minor superficial skin tear to the left forearm.  Skin:    General: Skin is warm and dry.     Coloration: Skin is pale.  Neurological:     General: No focal deficit present.     Mental Status: She is oriented to person, place, and time.     Coordination: Coordination normal.  Psychiatric:     Comments: Patient seems mildly anxious.  She is alert and cooperative.      ED Treatments / Results  Labs (all labs ordered are listed, but only abnormal results are displayed) Labs Reviewed  BASIC METABOLIC PANEL  CBC WITH DIFFERENTIAL/PLATELET  PROTIME-INR  I-STAT TROPONIN, ED    EKG EKG Interpretation  Date/Time:  Wednesday December 12 2018 16:30:35 EST Ventricular Rate:  104 PR Interval:    QRS Duration: 76 QT Interval:  350 QTC Calculation: 460 R Axis:   62 Text Interpretation:  Atrial fibrillation with rapid ventricular response Marked ST abnormality, possible inferior subendocardial injury Abnormal ECG marked ischemic changes. Confirmed by Charlesetta Shanks [phone]) on 12/12/2018 4:36:23 PM   Radiology Ct Head Wo Contrast  Result Date: 12/12/2018 CLINICAL DATA:  84 year old female with dizziness and near syncopal event resulting in a fall earlier today. She has a posterior scalp hematoma. EXAM: CT HEAD WITHOUT CONTRAST TECHNIQUE: Contiguous axial images were obtained from the base of the skull through the vertex without intravenous contrast. COMPARISON:  Prior brain MRI 11/08/2018 FINDINGS: Brain: No evidence of acute infarction, hemorrhage, hydrocephalus, extra-axial collection or mass lesion/mass effect. Mild cortical atrophy. Multiple circumscribed low-attenuation lesions present throughout the left basal ganglia with perhaps a few on the right. These  may represent dilated perivascular spaces or remote lacunar infarct. Chronic microvascular ischemic white matter disease again noted. Vascular: No hyperdense vessel or unexpected calcification. Skull: Normal. Negative for fracture or focal lesion. Sinuses/Orbits: No acute finding. Other: None. IMPRESSION: 1. No acute intracranial abnormality. 2. Multifocal subacute in remote punctate lacunar infarcts, cortical atrophy and chronic microvascular ischemic white matter disease. Electronically Signed   By: Jacqulynn Cadet M.D.   On: 12/12/2018 17:28   Dg Chest Port 1 View  Result Date: 12/12/2018 CLINICAL DATA:  84 year old female with history of dizziness. Near syncopal event today. Fall. EXAM: PORTABLE CHEST 1 VIEW COMPARISON:  Chest x-ray 11/06/2018. FINDINGS: Chronic elevation of the right hemidiaphragm, unchanged. Skinfold artifact over the lateral aspect of the right hemithorax incidentally noted. Widespread but heterogeneous distribution of interstitial prominence throughout the lungs bilaterally, similar to the prior study. No acute consolidative airspace disease. No pleural effusions. Probable subpulmonic right-sided pleural effusion, similar to priors. No left pleural effusion. No evidence of pulmonary edema. Heart size is normal. Aortic atherosclerosis. IMPRESSION: 1. Chronic right-sided small sub pulmonic pleural effusion, similar to the prior study. 2. Aortic atherosclerosis. Electronically Signed   By: Vinnie Langton M.D.   On: 12/12/2018 17:01    Procedures Procedures (including critical care time) CRITICAL CARE Performed by: Charlesetta Shanks   Total critical care time: 30 minutes  Critical care time was exclusive of separately billable procedures and treating other patients.  Critical care was necessary to treat or  prevent imminent or life-threatening deterioration.  Critical care was time spent personally by me on the following activities: development of treatment plan with patient  and/or surrogate as well as nursing, discussions with consultants, evaluation of patient's response to treatment, examination of patient, obtaining history from patient or surrogate, ordering and performing treatments and interventions, ordering and review of laboratory studies, ordering and review of radiographic studies, pulse oximetry and re-evaluation of patient's condition. Medications Ordered in ED Medications - No data to display   Initial Impression / Assessment and Plan / ED Course  I have reviewed the triage vital signs and the nursing notes.  Pertinent labs & imaging results that were available during my care of the patient were reviewed by me and considered in my medical decision making (see chart for details).  Clinical Course as of Dec 12 1736  Wed Dec 12, 2018  1708 Consult: Reviewed with Dr. Alvester Chou.  He has looked at the EKG with ischemic appearance.  We have reviewed the patient's history of present illness.  He advises this is consistent with subendocardial ischemic appearance but no STEMI.  He advises admission to hospitalist service for continued evaluation.   [MP]    Clinical Course User Index [MP] Charlesetta Shanks, MD   Upon initial presentation, patient described mechanical fall.  She had minor head injury but on Eliquis.  She complained of pain predominantly at the left hip and knee but had intact range of motion without deformity.  Initial plan was for CT head and plain film x-rays of the hip and the knee.  However, while awaiting diagnostic studies patient's heart rate increased to the 130s and 140s.  Patient then also complained of left anterior chest pain and increased shortness of breath.  She did not appear significantly more short of breath than previously.  She is on oxygen chronically and has known atrial fibrillation.  EKG obtained which showed diffuse ST depression with ischemic appearance.  This was reviewed with Dr. Alvester Chou and did not find this suggestive of acute  STEMI.  Advice is for continued monitoring and admission to hospitalist service.  With patient's clinical change, lab work has been added.  Portable chest x-ray reviewed first by myself with no appearance of gross recurrence of pleural effusion or pneumothorax.  Diagnostic studies are pending.  Case has been reviewed with Dr. Coralyn Pear at shift change to review remainder of diagnostic studies and final disposition.  Final Clinical Impressions(s) / ED Diagnoses   Final diagnoses:  Fall, initial encounter  Atrial fibrillation with RVR [facility])  Multiple contusions    ED Discharge Orders    None       Charlesetta Shanks, MD 12/12/18 1739

## 2018-12-12 NOTE — ED Provider Notes (Signed)
Signed out by Dr Johnney Killian that pt with syncope/near syncope, afib/rapid earlier, abnormal ecg (cardiology reviewed ecg and indicates ischemia/strain admit to medicine), and to call hospitalists for admission when labs resulted.   Initial trop is normal.   No chest pain or discomfort currently. Earlier ecg is concerning with ST elv AVL, AVR, V1, with ST dep inf/lat - prior ecg reviewed/discussed with cardiology, Dr Gwenlyn Found.   Head ct neg acute.  Hospitalists consulted for admission.     Lajean Saver, MD 12/12/18 [phone]

## 2018-12-12 NOTE — Telephone Encounter (Signed)
Reita Chard, RN with [facility] called to report the patient fall. She says she arrrived a few minutes after 1215 to the home and the patient's son reported a fall a few minutes before I arrived. She hit the back of head and has a small size hematoma to the back of the head and a skin tear to left arm, which was bandaged with iodoform gauze and coban wrap. The patient's vitals: BP sitting 150/68, standing 88/52, P 120, then dropped to 90's, R 22, O2 sats 94-97% on 2L oxygen. She says the patient is feeling dizzy. I advised to have the patient go to the ED for evaluation, she verbalized understanding and says she will let the son know, she's still there with the patient in the home. I advised if they are unable to take her by car, call EMS.

## 2018-12-12 NOTE — Assessment & Plan Note (Signed)
Concern still for possible malignancy although this has not been found in several samples. There is some mediastinal LAD and cancer in both lung and colon in the past. She has required drainage multiple times for palliation of SOB.

## 2018-12-12 NOTE — ED Notes (Signed)
Patient was given bag lunch and drink.  Okay per MD

## 2018-12-12 NOTE — Telephone Encounter (Signed)
Fyi.

## 2018-12-12 NOTE — Assessment & Plan Note (Signed)
Rx for hycodan cough syrup to help with SOB and cough. Likely some of the SOB is air hunger and may be helped by this. She does have pleural effusion which worsens her COPD and CHF which is unclear cause. She does not like taking her lasix at home and had not been taking properly prior to hospitalization.

## 2018-12-12 NOTE — Telephone Encounter (Signed)
Since I will be out of office starting noon and out for several weeks may not have time to call her personally. Can we try to find out concerns so we can address those?

## 2018-12-12 NOTE — Telephone Encounter (Signed)
LVM for daughter informing that Dr. Sharlet Salina will be out of the office for the rest of the week and to call and let us know her concerns if they need to be addressed right away or if it is something that she feels can wait.

## 2018-12-12 NOTE — ED Notes (Signed)
Report given to 3E RN. All questions answered 

## 2018-12-12 NOTE — H&P (Addendum)
History and Physical    Pamela Mcguire VPX:[id number] DOB: [birth year] DOA: 12/12/2018  PCP: Hoyt Koch, MD Patient coming from: Nursing home  Chief Complaint: Near syncope  HPI: Pamela Mcguire is a 84 y.o. female with medical history significant of A. fib, CAD, depression, hypertension, hyperlipidemia, GERD presenting to the hospital via EMS from her nursing home for evaluation of near syncope.  In the ED, patient went into A. fib with RVR with heart rate in the 130s to 140s.  She complained of left anterior chest pain and increased shortness of breath.  EKG revealed acute ischemic changes.  Case was discussed by ED provider with Dr. Alvester Chou from cardiology who did not find this suggestive of acute STEMI.  Recommended serial troponins and admission to medicine service.  Patient states she was walking to her bathroom and lost balance and fell.  Denies losing consciousness.  States she did hit her head when she fell.  She has been experiencing pain in her left knee and left hip since the fall.  States she is always lightheaded and short of breath.  Denies having any chest pain/pressure prior to the fall.  States sometimes she has chest pain but only when coughing.  States her appetite is good but she does not drink much fluid.  She takes Lasix every day.  No additional history could be obtained from the patient.  Review of Systems: As per HPI otherwise 10 point review of systems negative.  Past Medical History:  Diagnosis Date  . AF (atrial fibrillation) ([place])   . Anxiety   . Arthritis    "thighs, hips, arms" (04/28/2015)  . Breast cancer ([place])    right mastectomy  . CAD (coronary artery disease)   . Dehydration 12/06/2016  . Dementia ([place])   . Depression   . Diverticulitis   . GERD (gastroesophageal reflux disease)   . HCAP (healthcare-associated pneumonia) 09/15/2015  . Hyperlipidemia   . Hypertension   . Non-small cell carcinoma of lung, stage 3 ([place]) 04/30/2015  . Prediabetes   .  Seasonal allergies   . Shortness of breath dyspnea   . Sleep apnea     dx'd 10/2014,refused cpap machine  . Urinary frequency   . UTI (urinary tract infection) 06/06/2015    Past Surgical History:  Procedure Laterality Date  . BREAST SURGERY    . CARDIOVERSION    . Cataract surgery    . CESAREAN SECTION  X 2  . COLON SURGERY    . COLONOSCOPY    . COLONOSCOPY N/A 01/13/2017   Procedure: COLONOSCOPY;  Surgeon: Gatha Mayer, MD;  Location: [facility];  Service: Endoscopy;  Laterality: N/A;  . ESOPHAGOGASTRODUODENOSCOPY N/A 01/13/2017   Procedure: ESOPHAGOGASTRODUODENOSCOPY (EGD);  Surgeon: Gatha Mayer, MD;  Location: [facility];  Service: Endoscopy;  Laterality: N/A;  . LAPAROSCOPIC PARTIAL COLECTOMY N/A 02/08/2017   Procedure: LAPAROSCOPIC PARTIAL COLECTOMY;  Surgeon: Leighton Ruff, MD;  Location: [facility] ORS;  Service: General;  Laterality: N/A;  . MASTECTOMY Right   . MEDIASTINOSCOPY N/A 04/28/2015   Procedure: MEDIASTINOSCOPY;  Surgeon: [name], MD;  Location: [facility] OR;  Service: Thoracic;  Laterality: N/A;  . TONSILLECTOMY       reports that she quit smoking about 26 years ago. Her smoking use included cigarettes. She has a 35.00 pack-year smoking history. She has never used smokeless tobacco. She reports current alcohol use of about 9.0 standard drinks of alcohol per week. She reports that she does not use drugs.  No  Known Allergies  Family History  Problem Relation Age of Onset  . Mental illness Mother     Prior to Admission medications   Medication Sig Start Date End Date Taking? Authorizing Provider  acetaminophen (TYLENOL) 325 MG tablet Take 2 tablets (650 mg total) by mouth every 6 (six) hours as needed for mild pain (or Fever >/= 101). 11/12/18  Yes Florencia Reasons, MD  albuterol (PROVENTIL HFA;VENTOLIN HFA) 108 (90 Base) MCG/ACT inhaler Inhale 1 puff into the lungs every 6 (six) hours as needed for wheezing or shortness of breath. Reported on 06/01/2016 11/15/16  Yes  Gold[place] Circle, FNP  apixaban (ELIQUIS) 5 MG TABS tablet Take 1 tablet (5 mg total) by mouth 2 (two) times daily. 10/08/18  Yes Hilty, Nadean Corwin, MD  atorvastatin (LIPITOR) 40 MG tablet TAKE 1 TABLET(40 MG) BY MOUTH DAILY Patient taking differently: Take 40 mg by mouth daily.  12/07/18  Yes Hoyt Koch, MD  DIGOX 125 MCG tablet TAKE 1 TABLET(0.125 MG) BY MOUTH DAILY Patient taking differently: Take 0.125 mg by mouth daily.  02/12/18  Yes Hilty, Nadean Corwin, MD  diltiazem (CARDIZEM CD) 120 MG 24 hr capsule Take 1 capsule (120 mg total) by mouth daily. 12/07/18  Yes Hoyt Koch, MD  ferrous sulfate 325 (65 FE) MG tablet Take 325 mg by mouth every evening.    Yes [provider]  furosemide (LASIX) 80 MG tablet Take 1 tablet (80 mg total) by mouth 2 (two) times daily. 11/12/18  Yes Florencia Reasons, MD  HYDROcodone-homatropine [facility]) 5-1.5 MG/5ML syrup Take 5 mLs by mouth every 4 (four) hours as needed for cough (SOB). Patient taking differently: Take 5 mLs by mouth every 4 (four) hours as needed for cough (or "shortness of breath").  12/11/18  Yes Hoyt Koch, MD  ipratropium-albuterol (DUONEB) 0.5-2.5 (3) MG/3ML SOLN Take 3 mLs by nebulization 4 (four) times daily as needed. Patient taking differently: Take 3 mLs by nebulization daily as needed (shortness of breath).  09/19/17  Yes Hoyt Koch, MD  lactose free nutrition (BOOST) LIQD Take 237 mLs by mouth daily as needed (for supplementation).    Yes [provider]  loratadine (CLARITIN) 10 MG tablet TAKE 1 TABLET BY MOUTH AT BEDTIME Patient taking differently: Take 10 mg by mouth at bedtime.  12/07/18  Yes Hoyt Koch, MD  mirtazapine (REMERON) 15 MG tablet TAKE 1 TABLET(15 MG) BY MOUTH AT BEDTIME Patient taking differently: Take 15 mg by mouth at bedtime.  12/07/18  Yes Hoyt Koch, MD  Multiple Vitamins-Minerals (MULTIVITAMIN GUMMIES ADULT) CHEW Chew 1 tablet by mouth daily.    Yes [provider]  OXYGEN Inhale 2-3 L into the lungs continuous.    Yes [provider]  pantoprazole (PROTONIX) 40 MG tablet TAKE 1 TABLET(40 MG) BY MOUTH DAILY Patient taking differently: Take 40 mg by mouth daily.  10/08/18  Yes Hoyt Koch, MD  potassium chloride SA (K-DUR,KLOR-CON) 20 MEQ tablet Take 2 tablets (40 mEq total) by mouth every Monday, Wednesday, and Friday. 12/07/18  Yes Hoyt Koch, MD  sertraline (ZOLOFT) 25 MG tablet Take 1-2 tablets (25-50 mg total) by mouth daily. Patient taking differently: Take 50 mg by mouth daily.  09/12/18  Yes Hoyt Koch, MD  tolterodine (DETROL LA) 4 MG 24 hr capsule Take 1 capsule (4 mg total) by mouth daily. Patient taking differently: Take 4 mg by mouth every evening.  06/06/18  Yes Hoyt Koch, MD  guaiFENesin (MUCINEX) 600 MG 12 hr tablet Take 1 tablet (600 mg total) by mouth 2 (two) times daily. Patient not taking: Reported on 12/12/2018 11/12/18 11/12/19  Florencia Reasons, MD  magnesium gluconate (MAGONATE) 30 MG tablet Take 1 tablet (30 mg total) by mouth daily. Patient not taking: Reported on 12/12/2018 11/12/18   Florencia Reasons, MD    Physical Exam: Vitals:   12/12/18 2015 12/12/18 2100 12/12/18 2145 12/12/18 2215  BP: 109/62 (!) 118/53 (!) 114/59 (!) 121/56  Pulse: 96 89 86 80  Resp:   (!) 22 18  Temp:    98.5 F (36.9 C)  TempSrc:    Oral  SpO2: 99% 98% 96%   Weight:    64.2 kg  Height:    5\' 6"  (1.676 m)    Physical Exam  Constitutional: She is oriented to person, place, and time. No distress.  Frail elderly female  HENT:  Head: Normocephalic.  Slightly dry mucous membranes  Eyes: Pupils are equal, round, and reactive to light.  Neck: Neck supple.  Cardiovascular: Normal rate, regular rhythm and intact distal pulses.  Pulmonary/Chest: Effort normal.  Course breath sounds appreciated on auscultation diffusely. No wheezes or rales.   Abdominal: Soft. Bowel sounds are normal.  She exhibits no distension. There is no abdominal tenderness. There is no guarding.  Musculoskeletal:        General: No edema.  Neurological: She is alert and oriented to person, place, and time. No cranial nerve deficit.  Speech fluent, tongue midline, no facial droop Strength 5 out of 5 in bilateral upper and lower extremities Sensation to light touch intact throughout  Skin: Skin is warm and dry. She is not diaphoretic.  Psychiatric: She has a normal mood and affect. Her behavior is normal.     Labs on Admission: I have personally reviewed following labs and imaging studies  CBC: Recent Labs  Lab 12/12/18 1629  WBC 8.4  NEUTROABS 7.0  HGB 11.8*  HCT 36.0  MCV 90.9  PLT 300   Basic Metabolic Panel: Recent Labs  Lab 12/12/18 1850  NA 135  K 4.0  CL 93*  CO2 28  GLUCOSE 106*  BUN 11  CREATININE 1.09*  CALCIUM 8.2*   GFR: Estimated Creatinine Clearance: 37.9 mL/min (A) (by C-G formula based on SCr of 1.09 mg/dL (H)). Liver Function Tests: No results for input(s): AST, ALT, ALKPHOS, BILITOT, PROT, ALBUMIN in the last 168 hours. No results for input(s): LIPASE, AMYLASE in the last 168 hours. No results for input(s): AMMONIA in the last 168 hours. Coagulation Profile: Recent Labs  Lab 12/12/18 1629  INR 1.62   Cardiac Enzymes: Recent Labs  Lab 12/12/18 2234  TROPONINI 0.04*   BNP (last 3 results) Recent Labs    09/12/18 1130  PROBNP 175.0*   HbA1C: No results for input(s): HGBA1C in the last 72 hours. CBG: No results for input(s): GLUCAP in the last 168 hours. Lipid Profile: No results for input(s): CHOL, HDL, LDLCALC, TRIG, CHOLHDL, LDLDIRECT in the last 72 hours. Thyroid Function Tests: No results for input(s): TSH, T4TOTAL, FREET4, T3FREE, THYROIDAB in the last 72 hours. Anemia Panel: No results for input(s): VITAMINB12, FOLATE, FERRITIN, TIBC, IRON, RETICCTPCT in the last 72 hours. Urine analysis:    Component Value Date/Time   COLORURINE  YELLOW 12/12/2018 2020   APPEARANCEUR CLEAR 12/12/2018 2020   LABSPEC 1.010 12/12/2018 2020   LABSPEC 1.010 06/02/2015 1706   PHURINE 8.5 (H) 12/12/2018 2020   GLUCOSEU NEGATIVE 12/12/2018 2020  GLUCOSEU NEGATIVE 07/10/2017 1633   GLUCOSEU Negative 06/02/2015 1706   HGBUR SMALL (A) 12/12/2018 2020   BILIRUBINUR NEGATIVE 12/12/2018 2020   BILIRUBINUR 3+      4mg /dl 09/08/2016 1133   KETONESUR NEGATIVE 12/12/2018 2020   PROTEINUR NEGATIVE 12/12/2018 2020   UROBILINOGEN 0.2 07/10/2017 1633   UROBILINOGEN 0.2 06/02/2015 1706   NITRITE NEGATIVE 12/12/2018 2020   LEUKOCYTESUR NEGATIVE 12/12/2018 2020   LEUKOCYTESUR Moderate 06/02/2015 1706    Radiological Exams on Admission: Dg Knee 2 Views Left  Result Date: 12/12/2018 CLINICAL DATA:  84 year old female status post fall with pain. EXAM: LEFT KNEE - 1-2 VIEW COMPARISON:  None. FINDINGS: Small joint effusion on the cross-table lateral view. No fat fluid level identified. Patella appears intact. No fracture or dislocation identified. Joint spaces appear normal for age. IMPRESSION: Small joint effusion. No acute fracture or dislocation identified about the left knee. Electronically Signed   By: Genevie Ann M.D.   On: 12/12/2018 17:46   Ct Head Wo Contrast  Result Date: 12/12/2018 CLINICAL DATA:  84 year old female with dizziness and near syncopal event resulting in a fall earlier today. She has a posterior scalp hematoma. EXAM: CT HEAD WITHOUT CONTRAST TECHNIQUE: Contiguous axial images were obtained from the base of the skull through the vertex without intravenous contrast. COMPARISON:  Prior brain MRI 11/08/2018 FINDINGS: Brain: No evidence of acute infarction, hemorrhage, hydrocephalus, extra-axial collection or mass lesion/mass effect. Mild cortical atrophy. Multiple circumscribed low-attenuation lesions present throughout the left basal ganglia with perhaps a few on the right. These may represent dilated perivascular spaces or remote lacunar  infarct. Chronic microvascular ischemic white matter disease again noted. Vascular: No hyperdense vessel or unexpected calcification. Skull: Normal. Negative for fracture or focal lesion. Sinuses/Orbits: No acute finding. Other: None. IMPRESSION: 1. No acute intracranial abnormality. 2. Multifocal subacute in remote punctate lacunar infarcts, cortical atrophy and chronic microvascular ischemic white matter disease. Electronically Signed   By: Jacqulynn Cadet M.D.   On: 12/12/2018 17:28   Dg Chest Port 1 View  Result Date: 12/12/2018 CLINICAL DATA:  84 year old female with history of dizziness. Near syncopal event today. Fall. EXAM: PORTABLE CHEST 1 VIEW COMPARISON:  Chest x-ray 11/06/2018. FINDINGS: Chronic elevation of the right hemidiaphragm, unchanged. Skinfold artifact over the lateral aspect of the right hemithorax incidentally noted. Widespread but heterogeneous distribution of interstitial prominence throughout the lungs bilaterally, similar to the prior study. No acute consolidative airspace disease. No pleural effusions. Probable subpulmonic right-sided pleural effusion, similar to priors. No left pleural effusion. No evidence of pulmonary edema. Heart size is normal. Aortic atherosclerosis. IMPRESSION: 1. Chronic right-sided small sub pulmonic pleural effusion, similar to the prior study. 2. Aortic atherosclerosis. Electronically Signed   By: Vinnie Langton M.D.   On: 12/12/2018 17:01   Dg Hip Unilat With Pelvis 2-3 Views Left  Result Date: 12/12/2018 CLINICAL DATA:  84 year old female status post fall with pain. EXAM: DG HIP (WITH OR WITHOUT PELVIS) 2-3V LEFT COMPARISON:  CT Abdomen and Pelvis 10/30/2018 and earlier. FINDINGS: Bone mineralization is within normal limits for age. Femoral heads are normally located. Hip joint spaces appear symmetric and normal for age. Grossly intact proximal right femur. The proximal left femur appears intact. No pelvic fracture identified. Negative visible  bowel gas pattern, lower abdominal and pelvic visceral contours. IMPRESSION: No acute fracture or dislocation identified about the left hip or pelvis. Electronically Signed   By: Genevie Ann M.D.   On: 12/12/2018 17:45    EKG: Independently reviewed.  Sinus tachycardia (heart rate 109), ST elevation in aVL, aVR, V1 with ST depression in inferior and lateral leads.  ST changes are new compared to prior tracing.  Assessment/Plan Principal Problem:   ACS (acute coronary syndrome) (HCC) Active Problems:   A-fib (HCC)   HTN (hypertension)   Anemia   HLD (hyperlipidemia)   Near syncope   Knee pain   Hip pain   Suspected ACS Patient reported having left-sided chest pain and shortness of breath in the ED.  Troponin 0.02 > 0.04.   EKG showing ST elevation in aVL, aVR, V1 with ST depression in inferior and lateral leads.  ST changes are new compared to prior tracing. Case was discussed by ED provider with Dr. Alvester Chou from cardiology who did not find this suggestive of acute STEMI.  Recommended serial troponins and admission to medicine service.  Did not recommend starting heparin.  Patient is currently chest pain-free and in no distress. -Cardiac monitoring -Give full dose aspirin. No aspirin was administered in the ED.   -Trend troponin -Repeat EKG in a.m. -Echocardiogram  Near syncope, fall -Fall seems to be mechanical based on the history as patient did not lose consciousness.  Denies having any chest pain at that time. -Infectious etiology less likely as patient is afebrile and does not have leukocytosis.  UA not suggestive of infection.  Chest x-ray not suggestive of pneumonia. -Head CT negative for acute finding. 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  Per discharge summary from her hospitalization in December 2019, case was discussed with neurology and neuro interventionalist and patient was thought to be  high risk for going through a procedure.  Plan was for her to follow-up with neuro interventionalist Dr. Franchot Gallo in 2 weeks.  Please ensure patient has follow-up with Dr. Franchot Gallo. -Echocardiogram ordered -Cardiac monitoring -Check orthostatics -IV fluid hydration  Left knee pain secondary to fall X-ray showing small joint effusion; no acute fracture or dislocation. -Tylenol PRN  Left hip pain secondary to fall X-ray showing no acute fracture or dislocation. -Tylenol PRN  Paroxysmal A. fib Found to be in A. fib with RVR in the ED with heart rate in the 130s to 140s.  Currently rate controlled with heart rate in the 80s.  Continue home Eliquis, digoxin, diltiazem.   Hyperlipidemia -Continue home statin  Hypertension -Continue home diltiazem  Anemia Hemoglobin 11.8.  No signs of active bleeding. -Check iron, ferritin, TIBC -Continue home iron supplement  GERD -Continue PPI  Depression -Continue Zoloft  CKD 3 -Stable.  Creatinine 1.0, baseline 0.8-0.9.  DVT prophylaxis: Eliquis Code Status: Patient wishes to be DNR. Family Communication: No family available. Disposition Plan: Anticipate discharge in 1 to 2 days. Consults called: Cardiology Admission status: Observation, cardiac telemetry   Shela Leff MD [facility] Pager [phone]  If 7PM-7AM, please contact night-coverage [email or web address] Password [email or web address]  12/13/2018, 4:10 AM

## 2018-12-12 NOTE — Assessment & Plan Note (Signed)
Due to stopping anticoagulation in hospital and not amenable to retrieval. She is not able to perform ADLs currently and wants to work with PT to see how she can improve at home. Ordered palliative care to come out as well to help improve QOL. She does wish to focus on QOL.

## 2018-12-12 NOTE — Assessment & Plan Note (Signed)
Still in flare and using oxygen all the time. She is given hycodan cough syrup to help with air hunger. Following up with pulmonary for options. [place] database reviewed prior to script.

## 2018-12-12 NOTE — ED Triage Notes (Signed)
Pt in from [facility] via [place] where she had dizziness and a near syncopal event with a fall today. Pt has posterior head hematoma and is also c/o L knee pain s/p fall. Takes Eliquis, was 160>88 SBP orthostatic with EMS en route. A&ox4, denies any LOC

## 2018-12-12 NOTE — Telephone Encounter (Signed)
Patient daughter, [name], states that she can wait until Dr. Sharlet Salina returns to discuss.

## 2018-12-12 NOTE — Telephone Encounter (Signed)
Copied from [place] [id number]. Topic: General - Other >> Dec 12, 2018  9:13 AM Valla Leaver wrote: Reason for CRM: Daughter, [name] (on [facility]) would like a call from [place] to discuss moms care/appt yesterday.

## 2018-12-13 ENCOUNTER — Ambulatory Visit: Payer: Medicare HMO | Admitting: Internal Medicine

## 2018-12-13 ENCOUNTER — Observation Stay (HOSPITAL_BASED_OUTPATIENT_CLINIC_OR_DEPARTMENT_OTHER): Payer: Medicare HMO

## 2018-12-13 ENCOUNTER — Inpatient Hospital Stay (HOSPITAL_COMMUNITY): Payer: Medicare HMO

## 2018-12-13 DIAGNOSIS — R55 Syncope and collapse: Secondary | ICD-10-CM | POA: Diagnosis not present

## 2018-12-13 DIAGNOSIS — I951 Orthostatic hypotension: Secondary | ICD-10-CM | POA: Diagnosis not present

## 2018-12-13 DIAGNOSIS — E785 Hyperlipidemia, unspecified: Secondary | ICD-10-CM | POA: Diagnosis not present

## 2018-12-13 DIAGNOSIS — R0600 Dyspnea, unspecified: Secondary | ICD-10-CM

## 2018-12-13 DIAGNOSIS — I361 Nonrheumatic tricuspid (valve) insufficiency: Secondary | ICD-10-CM | POA: Diagnosis not present

## 2018-12-13 DIAGNOSIS — R9431 Abnormal electrocardiogram [ECG] [EKG]: Secondary | ICD-10-CM | POA: Diagnosis not present

## 2018-12-13 DIAGNOSIS — Z66 Do not resuscitate: Secondary | ICD-10-CM | POA: Diagnosis not present

## 2018-12-13 DIAGNOSIS — I249 Acute ischemic heart disease, unspecified: Secondary | ICD-10-CM | POA: Insufficient documentation

## 2018-12-13 DIAGNOSIS — M25559 Pain in unspecified hip: Secondary | ICD-10-CM

## 2018-12-13 DIAGNOSIS — T07XXXA Unspecified multiple injuries, initial encounter: Secondary | ICD-10-CM | POA: Diagnosis not present

## 2018-12-13 DIAGNOSIS — M25569 Pain in unspecified knee: Secondary | ICD-10-CM

## 2018-12-13 LAB — BASIC METABOLIC PANEL
Anion gap: 13 (ref 5–15)
BUN: 12 mg/dL (ref 8–23)
CHLORIDE: 96 mmol/L — AB (ref 98–111)
CO2: 28 mmol/L (ref 22–32)
Calcium: 7.5 mg/dL — ABNORMAL LOW (ref 8.9–10.3)
Creatinine, Ser: 1.07 mg/dL — ABNORMAL HIGH (ref 0.44–1.00)
GFR calc non Af Amer: 49 mL/min — ABNORMAL LOW (ref >60)
GFR, EST AFRICAN AMERICAN: 56 mL/min — AB (ref >60)
Glucose, Bld: 98 mg/dL (ref 70–99)
Potassium: 3.3 mmol/L — ABNORMAL LOW (ref 3.5–5.1)
Sodium: 137 mmol/L (ref 135–145)

## 2018-12-13 LAB — IRON AND TIBC
Iron: 89 ug/dL (ref 28–170)
Saturation Ratios: 40 % — ABNORMAL HIGH (ref 10.4–31.8)
TIBC: 223 ug/dL — AB (ref 250–450)
UIBC: 134 ug/dL

## 2018-12-13 LAB — ECHOCARDIOGRAM COMPLETE
Height: 66 in
Weight: 2264.57 oz

## 2018-12-13 LAB — FERRITIN: FERRITIN: 260 ng/mL (ref 11–307)

## 2018-12-13 LAB — TROPONIN I
Troponin I: 0.03 ng/mL (ref <0.03)
Troponin I: <0.03 ng/mL (ref <0.03)

## 2018-12-13 MED ORDER — DICLOFENAC SODIUM 1 % TD GEL
2.0000 g | Freq: Four times a day (QID) | TRANSDERMAL | Status: DC
  Administered 2018-12-13 – 2018-12-16 (×10): 2 g via TOPICAL
  Filled 2018-12-13: qty 100

## 2018-12-13 MED ORDER — BENZONATATE 100 MG PO CAPS
100.0000 mg | ORAL_CAPSULE | Freq: Three times a day (TID) | ORAL | Status: DC | PRN
  Administered 2018-12-14 – 2018-12-16 (×3): 100 mg via ORAL
  Filled 2018-12-13 (×4): qty 1

## 2018-12-13 MED ORDER — HYDROCODONE-HOMATROPINE 5-1.5 MG/5ML PO SYRP
5.0000 mL | ORAL_SOLUTION | Freq: Every evening | ORAL | Status: DC | PRN
  Administered 2018-12-14 – 2018-12-15 (×2): 5 mL via ORAL
  Filled 2018-12-13 (×2): qty 5

## 2018-12-13 MED ORDER — SODIUM CHLORIDE 0.9 % IV SOLN
INTRAVENOUS | Status: DC
  Administered 2018-12-13: 12:00:00 via INTRAVENOUS

## 2018-12-13 MED ORDER — POTASSIUM CHLORIDE CRYS ER 20 MEQ PO TBCR
40.0000 meq | EXTENDED_RELEASE_TABLET | Freq: Once | ORAL | Status: AC
  Administered 2018-12-13: 40 meq via ORAL
  Filled 2018-12-13: qty 2

## 2018-12-13 MED ORDER — ASPIRIN 81 MG PO CHEW
324.0000 mg | CHEWABLE_TABLET | Freq: Once | ORAL | Status: AC
  Administered 2018-12-13: 324 mg via ORAL
  Filled 2018-12-13: qty 4

## 2018-12-13 NOTE — Progress Notes (Signed)
Spoke with [facility] nurse via phone regarding patients skin care order set for skin tears.  Will remove current gauze using saline and cover with petroleum gauze and roll gauze.

## 2018-12-13 NOTE — Evaluation (Signed)
Physical Therapy Evaluation Patient Details Name: Pamela Mcguire MRN: [id number] DOB: Sep 29, 1935 Today's Date: 12/13/2018   History of Present Illness  Pt is an 84 y.o. female admitted 12/12/18 for evaluation of near syncope resulting in fall with LLE pain. Pt also with SOB, afib with RVR and chest pain; EKG revealed ischemic changes; cardiology ruled out STEMI. L knee imaging with small joint effusion; hip imaging (-) for fx. PMH includes HTN, CAD, dementia, afib, arthritis, anxiety.    Clinical Impression  Pt presents with an overall decrease in functional mobility secondary to above. PTA, pt is a resident at [facility], but has been staying at daughter's house since recent d/c from [facility] for rehab. Pt ambulatory with rollator, able to perform ADLs with supervision. Fall leading to admission is only recent fall family recalls. Today, pt able to transfer and walk short distance with RW and min guard; limited by c/o significant dizziness (see BP values below). At high risk for falls. Recommend short-term SNF-level therapies to maximize functional mobility and independence prior to return home; family currently considering transition from ILF to ALF; increased time spent discussing this. Will follow acutely.  BPs  Sitting at EOB 108/57  Seated on toilet after walking to bathroom 81/33  Seated in recliner after walking from bathroom 145/59  Seated ~3 min 109/53      Follow Up Recommendations SNF;Supervision for mobility/OOB(if declining, HHPT with continued 24/7 assist from family)    Equipment Recommendations  None recommended by PT    Recommendations for Other Services       Precautions / Restrictions Precautions Precautions: Fall;Other (comment) Precaution Comments: Orthostatic hypotension Restrictions Weight Bearing Restrictions: No      Mobility  Bed Mobility Overal bed mobility: Modified Independent             General bed mobility comments: Increased time  and effort; HOB elevated. C/o dizziness upon sitting, BP 108/57.  Transfers Overall transfer level: Needs assistance Equipment used: Rolling walker (2 wheeled) Transfers: Sit to/from Stand Sit to Stand: Supervision;Min assist         General transfer comment: Increased time and effort, limited by left hip and knee pain; cues for safe hand placement. Supervision to stand from bed, minA for trunk elevation standing from lower toilet height  Ambulation/Gait Ambulation/Gait assistance: Min guard Gait Distance (Feet): 30 Feet Assistive device: Rolling walker (2 wheeled) Gait Pattern/deviations: Step-to pattern;Trunk flexed;Antalgic Gait velocity: Decreased Gait velocity interpretation: <1.8 ft/sec, indicate of risk for recurrent falls General Gait Details: Slow, antalgic amb to/from bathroom with RW and close min guard for balance secondary to pt's c/o dizziness; BP 81/33 while seated on toilet. Upon return to recliner BP 145/59  Stairs            Wheelchair Mobility    Modified Rankin (Stroke Patients Only)       Balance Overall balance assessment: Needs assistance   Sitting balance-Leahy Scale: Fair Sitting balance - Comments: Indep to perform pericare seated on toilet     Standing balance-Leahy Scale: Poor Standing balance comment: Reliant on UE support                             Pertinent Vitals/Pain Pain Assessment: Faces Faces Pain Scale: Hurts even more Pain Location: L knee Pain Descriptors / Indicators: Sore;Grimacing;Guarding;Moaning Pain Intervention(s): Limited activity within patient's tolerance;Monitored during session    Home Living Family/patient expects to be discharged to:: Assisted living([facility]  ILF)               Home Equipment: Walker - 4 wheels Additional Comments: Pt is resident at [facility], living alone in [place] apartment, mod indep with mobility. Pt with recent admission to [facility], since then has been  staying at daughter's home for increased supervision/assist. Unsure if will d/c back to daughter's home or ILF    Prior Function Level of Independence: Needs assistance   Gait / Transfers Assistance Needed: Ambulatory with rollator, ambulates to dining room at [facility]. Fall leading to admission was only fall pt reports  ADL's / Homemaking Assistance Needed: Indep with ADLs, ILF only assists with medications. Since staying at daughter's home, pt performing seated showers with supervision        Hand Dominance        Extremity/Trunk Assessment   Upper Extremity Assessment Upper Extremity Assessment: Generalized weakness    Lower Extremity Assessment Lower Extremity Assessment: Generalized weakness    Cervical / Trunk Assessment Cervical / Trunk Assessment: Kyphotic  Communication   Communication: HOH  Cognition Arousal/Alertness: Awake/alert Behavior During Therapy: WFL for tasks assessed/performed Overall Cognitive Status: Within Functional Limits for tasks assessed                                 General Comments: WFL for simple tasks; HOH      General Comments General comments (skin integrity, edema, etc.): Family present. SpO2 down to 84% on RA, returning to >90% on 2L O2 [place]     Exercises     Assessment/Plan    PT Assessment Patient needs continued PT services  PT Problem List Decreased strength;Decreased activity tolerance;Decreased balance;Decreased mobility;Cardiopulmonary status limiting activity       PT Treatment Interventions DME instruction;Gait training;Stair training;Functional mobility training;Therapeutic activities;Therapeutic exercise;Balance training;Patient/family education    PT Goals (Current goals can be found in the Care Plan section)  Acute Rehab PT Goals Patient Stated Goal: Figure out what is causing this dizziness PT Goal Formulation: With patient/family Time For Goal Achievement: 12/27/18 Potential to Achieve Goals:  Fair    Frequency Min 3X/week   Barriers to discharge Decreased caregiver support      Co-evaluation               AM-PAC PT "6 Clicks" Mobility  Outcome Measure Help needed turning from your back to your side while in a flat bed without using bedrails?: A Little Help needed moving from lying on your back to sitting on the side of a flat bed without using bedrails?: A Little Help needed moving to and from a bed to a chair (including a wheelchair)?: A Little Help needed standing up from a chair using your arms (e.g., wheelchair or bedside chair)?: A Little Help needed to walk in hospital room?: A Little Help needed climbing 3-5 steps with a railing? : A Lot 6 Click Score: 17    End of Session Equipment Utilized During Treatment: Gait belt;Oxygen Activity Tolerance: Patient tolerated treatment well;Treatment limited secondary to medical complications (Comment) Patient left: in chair;with call bell/phone within reach;with chair alarm set;with family/visitor present Nurse Communication: Mobility status PT Visit Diagnosis: Other abnormalities of gait and mobility (R26.89)    Time: 3532-9924 PT Time Calculation (min) (ACUTE ONLY): 39 min   Charges:   PT Evaluation $PT Eval Moderate Complexity: 1 Mod PT Treatments $Gait Training: 8-22 mins $Self Care/Home Management: 8-22  Mabeline Caras, PT, DPT Acute Rehabilitation Services  Pager [phone] Office [place] 12/13/2018, 2:34 PM

## 2018-12-13 NOTE — Consult Note (Signed)
[facility] Nurse wound consult note Patient receiving care at [facility].  I spoke with the primary RN, Velia via telephone Reason for Consult: skin tear According to the primary care RN, Velia, the patient has a small skin tear on the left arm and left knee.  I have activated the Skin Care Order Set for skin tears. Monitor the wound area(s) for worsening of condition such as: Signs/symptoms of infection,  Increase in size,  Development of or worsening of odor, Development of pain, or increased pain at the affected locations.  Notify the medical team if any of these develop.  Thank you for the consult.  Discussed plan of care with the bedside nurse.  [place] nurse will not follow at this time.  Please re-consult the [facility] team if needed.  Val Riles, RN, MSN, CWOCN, CNS-BC, pager [phone]

## 2018-12-13 NOTE — Progress Notes (Signed)
Progress Note    Nadalie Laughner  ZHY:[id number] DOB: 07/04/1935  DOA: 12/12/2018 PCP: Hoyt Koch, MD    Brief Narrative:    Medical records reviewed and are as summarized below:  Dalene Robards is an 84 y.o. female with medical history significant of A. fib, CAD, depression, hypertension, hyperlipidemia, GERD presenting to the hospital via EMS from [facility] for evaluation of syncope.    Assessment/Plan:   Principal Problem:   ACS (acute coronary syndrome) (HCC) Active Problems:   A-fib (HCC)   HTN (hypertension)   HLD (hyperlipidemia)   Near syncope   Knee pain   Hip pain  Orthostatic hypotension -story is consistent with this -BP checked by home health RN supportive of this -volume status is difficult as she has had 2 recent thoracentesis for pleural effusions (12/19-- her lasix was doubled from 40 mg BID to 80mg  BID Per PCCM from last admission: Impression/Plan: Persistent pleural effusion on the right: this has been present for months. As such it will never go away.  I don't think it had anything to do with her pathology from this admission as she was admitted with a left sided CAP from H. Flu.   > would not perform thoracentesis again > monitor with repeat imaging as coordinating for cancer surveillance in oncology clinic Her hemoptysis bothers me because of her history of lung cancer.  I would like to perform a bronchoscopy but I think she is too frail to go through that right now as she continues to recover from CAP. > hold eliquis > complete treatment for CAP > needs f/u with Dr. Earlie Server and likely PET scan > if she gains strength we can perform a bronchoscopy  H/o lung cancer -per family Dr. Inda Merlin has told them there is nothing that can be done -last note from D. Mohammad: ) Stage IIIA non-small cell lung cancer, adenocarcinoma status post concurrent chemoradiation with partial response and she was not considered a good candidate for consolidation  chemotherapy. The patient is currently on observation. The patient has a repeat CT scan of the chest that showed no concerning findings for disease progression except for the enlarging right pleural effusion.  I discussed the scan results with the patient and her daughter and recommended for her to proceed with ultrasound-guided right thoracentesis for diagnostic and therapeutic purposes.  If the cytology of the pleural fluid is negative for malignancy, I will see her back for follow-up visit in 6 months for evaluation with repeat CT scan of the chest. 2) stage II (T3, N0, Mx) colon adenocarcinoma status post partial colectomy with lymph node dissection. The patient will continue on observation for now. She was advised to call immediately if she has any concerning symptoms in the interval.  Skin tear -WOC consult  Chronic respiratory failure due to hypoxia -continue home O2  Hypokalemia -replete  Left knee pain secondary to fall X-ray showing small joint effusion; no acute fracture or dislocation. -Tylenol PRN -trial of voltaren gel  Left hip pain secondary to fall X-ray showing no acute fracture or dislocation. -Tylenol PRN  Paroxysmal A. fib - rate controlled with heart rate in the 80s.  - Continue home Eliquis, digoxin -holding parameters on diltiazem.   Hyperlipidemia -Continue home statin  GERD -Continue PPI  Depression -Continue Zoloft   Family Communication/Anticipated D/C date and plan/Code Status   DVT prophylaxis: eliquis Code Status: dnr Family Communication: multiple at bedside Disposition Plan: long discussion with family regarding prognosis/symptoms-- family is asking for  palliative care consult for [place]-- seems to be some confusion about options-- not sure if family has not been going to appointments   Medical Consultants:    Palliative care    Subjective:   [place] after getting up from the bed to go to the bathroom Patient and family asking  for a palliative care consult  Objective:    Vitals:   12/13/18 0434 12/13/18 1002 12/13/18 1009 12/13/18 1214  BP:  (!) 107/40 (!) 84/53 (!) 112/49  Pulse:  87 74 71  Resp: (!) 26 (!) 21  (!) 22  Temp:    97.8 F (36.6 C)  TempSrc:    Oral  SpO2: 94% 94% 96% 93%  Weight:      Height:        Intake/Output Summary (Last 24 hours) at 12/13/2018 1623 Last data filed at 12/13/2018 1154 Gross per 24 hour  Intake 1948.84 ml  Output 0 ml  Net 1948.84 ml   Filed Weights   12/12/18 1428 12/12/18 2215 12/13/18 0420  Weight: 64.8 kg 64.2 kg 64.2 kg    Exam: Frail chronically ill female Increased work of breathing Coarse breath sounds through out Skin tear on left arm +LE edema b/l  Data Reviewed:   I have personally reviewed following labs and imaging studies:  Labs: Labs show the following:   Basic Metabolic Panel: Recent Labs  Lab 12/12/18 1850 12/13/18 0614  NA 135 137  K 4.0 3.3*  CL 93* 96*  CO2 28 28  GLUCOSE 106* 98  BUN 11 12  CREATININE 1.09* 1.07*  CALCIUM 8.2* 7.5*   GFR Estimated Creatinine Clearance: 38.6 mL/min (A) (by C-G formula based on SCr of 1.07 mg/dL (H)). Liver Function Tests: No results for input(s): AST, ALT, ALKPHOS, BILITOT, PROT, ALBUMIN in the last 168 hours. No results for input(s): LIPASE, AMYLASE in the last 168 hours. No results for input(s): AMMONIA in the last 168 hours. Coagulation profile Recent Labs  Lab 12/12/18 1629  INR 1.62    CBC: Recent Labs  Lab 12/12/18 1629  WBC 8.4  NEUTROABS 7.0  HGB 11.8*  HCT 36.0  MCV 90.9  PLT 307   Cardiac Enzymes: Recent Labs  Lab 12/12/18 2234 12/13/18 0614 12/13/18 0919  TROPONINI 0.04* 0.03* <0.03   BNP (last 3 results) Recent Labs    09/12/18 1130  PROBNP 175.0*   CBG: No results for input(s): GLUCAP in the last 168 hours. D-Dimer: No results for input(s): DDIMER in the last 72 hours. Hgb A1c: No results for input(s): HGBA1C in the last 72 hours. Lipid  Profile: No results for input(s): CHOL, HDL, LDLCALC, TRIG, CHOLHDL, LDLDIRECT in the last 72 hours. Thyroid function studies: No results for input(s): TSH, T4TOTAL, T3FREE, THYROIDAB in the last 72 hours.  Invalid input(s): FREET3 Anemia work up: Recent Labs    12/13/18 0614  FERRITIN 260  TIBC 223*  IRON 89   Sepsis Labs: Recent Labs  Lab 12/12/18 1629  WBC 8.4    Microbiology No results found for this or any previous visit (from the past 240 hour(s)).  Procedures and diagnostic studies:  Dg Knee 2 Views Left  Result Date: 12/12/2018 CLINICAL DATA:  84 year old female status post fall with pain. EXAM: LEFT KNEE - 1-2 VIEW COMPARISON:  None. FINDINGS: Small joint effusion on the cross-table lateral view. No fat fluid level identified. Patella appears intact. No fracture or dislocation identified. Joint spaces appear normal for age. IMPRESSION: Small joint effusion. No acute fracture  or dislocation identified about the left knee. Electronically Signed   By: Genevie Ann M.D.   On: 12/12/2018 17:46   Ct Head Wo Contrast  Result Date: 12/12/2018 CLINICAL DATA:  84 year old female with dizziness and near syncopal event resulting in a fall earlier today. She has a posterior scalp hematoma. EXAM: CT HEAD WITHOUT CONTRAST TECHNIQUE: Contiguous axial images were obtained from the base of the skull through the vertex without intravenous contrast. COMPARISON:  Prior brain MRI 11/08/2018 FINDINGS: Brain: No evidence of acute infarction, hemorrhage, hydrocephalus, extra-axial collection or mass lesion/mass effect. Mild cortical atrophy. Multiple circumscribed low-attenuation lesions present throughout the left basal ganglia with perhaps a few on the right. These may represent dilated perivascular spaces or remote lacunar infarct. Chronic microvascular ischemic white matter disease again noted. Vascular: No hyperdense vessel or unexpected calcification. Skull: Normal. Negative for fracture or focal  lesion. Sinuses/Orbits: No acute finding. Other: None. IMPRESSION: 1. No acute intracranial abnormality. 2. Multifocal subacute in remote punctate lacunar infarcts, cortical atrophy and chronic microvascular ischemic white matter disease. Electronically Signed   By: Jacqulynn Cadet M.D.   On: 12/12/2018 17:28   Dg Chest Port 1 View  Result Date: 12/13/2018 CLINICAL DATA:  Dyspnea EXAM: PORTABLE CHEST 1 VIEW COMPARISON:  12/12/2018 FINDINGS: Cardiac shadow is stable. Aortic calcifications are again seen. Persistent elevation of the right hemidiaphragm is noted consistent with the sub pulmonic effusion. Chronic fibrotic changes are noted most marked in the right upper lobe. No new focal abnormality is seen. IMPRESSION: Stable appearance of the chest when compared with the previous day. Electronically Signed   By: Inez Catalina M.D.   On: 12/13/2018 16:01   Dg Chest Port 1 View  Result Date: 12/12/2018 CLINICAL DATA:  84 year old female with history of dizziness. Near syncopal event today. Fall. EXAM: PORTABLE CHEST 1 VIEW COMPARISON:  Chest x-ray 11/06/2018. FINDINGS: Chronic elevation of the right hemidiaphragm, unchanged. Skinfold artifact over the lateral aspect of the right hemithorax incidentally noted. Widespread but heterogeneous distribution of interstitial prominence throughout the lungs bilaterally, similar to the prior study. No acute consolidative airspace disease. No pleural effusions. Probable subpulmonic right-sided pleural effusion, similar to priors. No left pleural effusion. No evidence of pulmonary edema. Heart size is normal. Aortic atherosclerosis. IMPRESSION: 1. Chronic right-sided small sub pulmonic pleural effusion, similar to the prior study. 2. Aortic atherosclerosis. Electronically Signed   By: Vinnie Langton M.D.   On: 12/12/2018 17:01   Dg Hip Unilat With Pelvis 2-3 Views Left  Result Date: 12/12/2018 CLINICAL DATA:  84 year old female status post fall with pain. EXAM: DG  HIP (WITH OR WITHOUT PELVIS) 2-3V LEFT COMPARISON:  CT Abdomen and Pelvis 10/30/2018 and earlier. FINDINGS: Bone mineralization is within normal limits for age. Femoral heads are normally located. Hip joint spaces appear symmetric and normal for age. Grossly intact proximal right femur. The proximal left femur appears intact. No pelvic fracture identified. Negative visible bowel gas pattern, lower abdominal and pelvic visceral contours. IMPRESSION: No acute fracture or dislocation identified about the left hip or pelvis. Electronically Signed   By: Genevie Ann M.D.   On: 12/12/2018 17:45    Medications:   . apixaban  5 mg Oral BID  . atorvastatin  40 mg Oral Daily  . diclofenac sodium  2 g Topical QID  . digoxin  0.125 mg Oral Daily  . diltiazem  120 mg Oral Daily  . ferrous sulfate  325 mg Oral QPM  . fesoterodine  4 mg Oral  Daily  . guaiFENesin  600 mg Oral BID  . loratadine  10 mg Oral QHS  . mirtazapine  15 mg Oral QHS  . multivitamin with minerals  1 tablet Oral Daily  . pantoprazole  40 mg Oral Daily  . sertraline  50 mg Oral Daily   Continuous Infusions:   LOS: 0 days   Geradine Girt  [facility]   *Please refer to [email or web address], password [email or web address] to get updated schedule on who will round on this patient, as hospitalists switch teams weekly. If 7PM-7AM, please contact night-coverage at [email or web address], password [email or web address] for any overnight needs.  12/13/2018, 4:23 PM

## 2018-12-13 NOTE — Progress Notes (Signed)
  Echocardiogram 2D Echocardiogram has been performed.  Pamela Mcguire 12/13/2018, 9:04 AM

## 2018-12-14 ENCOUNTER — Other Ambulatory Visit: Payer: Self-pay

## 2018-12-14 DIAGNOSIS — Z7189 Other specified counseling: Secondary | ICD-10-CM

## 2018-12-14 DIAGNOSIS — R55 Syncope and collapse: Secondary | ICD-10-CM | POA: Diagnosis not present

## 2018-12-14 DIAGNOSIS — C3411 Malignant neoplasm of upper lobe, right bronchus or lung: Secondary | ICD-10-CM | POA: Diagnosis not present

## 2018-12-14 DIAGNOSIS — Z515 Encounter for palliative care: Secondary | ICD-10-CM | POA: Diagnosis not present

## 2018-12-14 DIAGNOSIS — I4891 Unspecified atrial fibrillation: Secondary | ICD-10-CM | POA: Diagnosis not present

## 2018-12-14 LAB — COMPREHENSIVE METABOLIC PANEL
ALT: 16 U/L (ref 0–44)
AST: 24 U/L (ref 15–41)
Albumin: 2.7 g/dL — ABNORMAL LOW (ref 3.5–5.0)
Alkaline Phosphatase: 48 U/L (ref 38–126)
Anion gap: 10 (ref 5–15)
BUN: 11 mg/dL (ref 8–23)
CO2: 29 mmol/L (ref 22–32)
Calcium: 8.3 mg/dL — ABNORMAL LOW (ref 8.9–10.3)
Chloride: 100 mmol/L (ref 98–111)
Creatinine, Ser: 0.94 mg/dL (ref 0.44–1.00)
GFR calc Af Amer: >60 mL/min (ref >60)
GFR calc non Af Amer: 57 mL/min — ABNORMAL LOW (ref >60)
Glucose, Bld: 116 mg/dL — ABNORMAL HIGH (ref 70–99)
Potassium: 4.2 mmol/L (ref 3.5–5.1)
Sodium: 139 mmol/L (ref 135–145)
Total Bilirubin: 0.5 mg/dL (ref 0.3–1.2)
Total Protein: 6 g/dL — ABNORMAL LOW (ref 6.5–8.1)

## 2018-12-14 LAB — CBC
HCT: 35.7 % — ABNORMAL LOW (ref 36.0–46.0)
Hemoglobin: 11.9 g/dL — ABNORMAL LOW (ref 12.0–15.0)
MCH: 30.3 pg (ref 26.0–34.0)
MCHC: 33.3 g/dL (ref 30.0–36.0)
MCV: 90.8 fL (ref 80.0–100.0)
Platelets: 306 10*3/uL (ref 150–400)
RBC: 3.93 MIL/uL (ref 3.87–5.11)
RDW: 13.8 % (ref 11.5–15.5)
WBC: 6.2 10*3/uL (ref 4.0–10.5)
nRBC: 0 % (ref 0.0–0.2)

## 2018-12-14 NOTE — Consult Note (Signed)
Consultation Note Date: 12/14/2018   Patient Name: Pamela Mcguire  DOB: 12-01-1934  MRN: [id number]  Age / Sex: 84 y.o., female  PCP: Hoyt Koch, MD Referring Physician: Albertine Patricia, MD  Reason for Consultation: Establishing goals of care, Hospice Evaluation and Psychosocial/spiritual support  HPI/Patient Profile: 84 y.o. female  with past medical history of CVA, diastolic heart failure, A. fib, obstructive sleep apnea, COPD, non-small cell lung cancer with right upper lobe lobectomy, history of thoracentesis July 2019, September 2019; history of colon cancer with colectomy 2018; remote history of breast cancer with right mastectomy admitted on 12/12/2018 with near syncope, orthostasis.  She was complaining of chest pain and was admitted with acute coronary syndrome.  Patient was seen by cardiology and felt to not have an acute STEMI.  Patient has been seen by oncology services in the past, Dr. Earlie Server, and has had chemotherapy as well as radiation treatment   and is not a candidate for further chemo or radiation treatment.  She has had a recurrent right pleural effusion, cytology wasnegative for malignancy.   Consult ordered for goals of care and hospice evaluation.  Patient has been seen by palliative medicine services in December 2019 for goals of care discussion as well as hospice discussions  Clinical Assessment and Goals of Care: Patient seen, chart reviewed.  Spoke to patient's daughter, Pamela Mcguire on the phone as well as patient today.  Both patient and daughter Pamela Mcguire, struggling with grasping why the fluid keeps returning and what could be "done about it".  Patient also states that the physicians have talked about hospice which alarmed her.  Per chart review, when palliative medicine provider saw her in December 2019 hospice was discussed at that time as well.  Pamela Mcguire reached the obituaries  daily.  In these, she has noticed hospice referenced.  I spoke to her regarding different levels of hospice, hospice at home or hospice hospital.  I did share with her that the majority I believe what she is reading in the obituaries is related to people who are in the hospice hospitals.    Daughter has been struggling to care for her in her home but recognizes her mother's decline and that she can no longer return to independent living at [facility].  Patient has recently just been discharged from [facility] for rehab and is already been readmitted to the hospital.  Patient can participate in some goals of care discussion.  Her daughters, Pamela Mcguire at [phone] as well as daughter Pamela Mcguire, daughter, [phone]    SUMMARY OF RECOMMENDATIONS   DNR DNI Family meeting scheduled for 12/15/2018 at 1:30 PM Family still struggling with disease processes and understanding next steps.  We will attempt to address all these at tomorrow's meeting.  Options would include discharge to SNF with further rehab; hospice at her daughter's home but her daughter has to work full-time and patient is too weak to be left alone; does patient and family need further work-up/cosultation from Dr. Lew Dawes standpoint ? Code Status/Advance  Care Planning:  DNR   Palliative Prophylaxis:   Aspiration, Bowel Regimen, Delirium Protocol, Eye Care, Frequent Pain Assessment, Oral Care and Turn Reposition  Additional Recommendations (Limitations, Scope, Preferences):  No Chemotherapy, No Hemodialysis, No Radiation and No Tracheostomy  Psycho-social/Spiritual:   Desire for further Chaplaincy support:no  Additional Recommendations: Referral to Community Resources   Prognosis:   < 6 months in the setting of non-small cell lung cancer with recurrent right pleural effusion (cytology negative for malignancy), etiology likely multifactorial; multisystem frailty including CVA diastolic heart failure atrial fib COPD  history of colon cancer history of breast cancer.  Did share with patient and daughter that patient would qualify for her hospice in-home benefit if this was something she elects  Discharge Planning: To Be Determined      Primary Diagnoses: Present on Admission: . Near syncope   I have reviewed the medical record, interviewed the patient and family, and examined the patient. The following aspects are pertinent.  Past Medical History:  Diagnosis Date  . AF (atrial fibrillation) ([place])   . Anxiety   . Arthritis    "thighs, hips, arms" (04/28/2015)  . Breast cancer ([place])    right mastectomy  . CAD (coronary artery disease)   . Dehydration 12/06/2016  . Dementia ([place])   . Depression   . Diverticulitis   . GERD (gastroesophageal reflux disease)   . HCAP (healthcare-associated pneumonia) 09/15/2015  . Hyperlipidemia   . Hypertension   . Non-small cell carcinoma of lung, stage 3 ([place]) 04/30/2015  . Prediabetes   . Seasonal allergies   . Shortness of breath dyspnea   . Sleep apnea     dx'd 10/2014,refused cpap machine  . Urinary frequency   . UTI (urinary tract infection) 06/06/2015   Social History   Socioeconomic History  . Marital status: Widowed    Spouse name: Not on file  . Number of children: 4  . Years of education: 1  . Highest education level: Not on file  Occupational History  . Occupation: retired  Scientific laboratory technician  . Financial resource strain: Not hard at all  . Food insecurity:    Worry: Never true    Inability: Never true  . Transportation needs:    Medical: No    Non-medical: No  Tobacco Use  . Smoking status: Former Smoker    Packs/day: 1.00    Years: 35.00    Pack years: 35.00    Types: Cigarettes    Last attempt to quit: 11/28/1992    Years since quitting: 26.0  . Smokeless tobacco: Never Used  . Tobacco comment: "stopped smoking at age 21"  Substance and Sexual Activity  . Alcohol use: Yes    Alcohol/week: 9.0 standard drinks    Types: 9 Glasses of  wine per week    Comment: 04/28/2015 "drink 3, 6oz,  glasses of wine on Sat & Sun"  . Drug use: No  . Sexual activity: Never  Lifestyle  . Physical activity:    Days per week: 0 days    Minutes per session: 0 min  . Stress: To some extent  Relationships  . Social connections:    Talks on phone: More than three times a week    Gets together: More than three times a week    Attends religious service: Not on file    Active member of club or organization: Not on file    Attends meetings of clubs or organizations: Not on file    Relationship status:  Widowed  Other Topics Concern  . Not on file  Social History Narrative   Fun: Word search and read   Denies abuse and feels safe at home   Family History  Problem Relation Age of Onset  . Mental illness Mother    Scheduled Meds: . apixaban  5 mg Oral BID  . atorvastatin  40 mg Oral Daily  . diclofenac sodium  2 g Topical QID  . digoxin  0.125 mg Oral Daily  . diltiazem  120 mg Oral Daily  . ferrous sulfate  325 mg Oral QPM  . fesoterodine  4 mg Oral Daily  . guaiFENesin  600 mg Oral BID  . loratadine  10 mg Oral QHS  . mirtazapine  15 mg Oral QHS  . multivitamin with minerals  1 tablet Oral Daily  . pantoprazole  40 mg Oral Daily  . sertraline  50 mg Oral Daily   Continuous Infusions: PRN Meds:.acetaminophen, benzonatate, HYDROcodone-homatropine, ipratropium-albuterol, lactose free nutrition Medications Prior to Admission:  Prior to Admission medications   Medication Sig Start Date End Date Taking? Authorizing Provider  acetaminophen (TYLENOL) 325 MG tablet Take 2 tablets (650 mg total) by mouth every 6 (six) hours as needed for mild pain (or Fever >/= 101). 11/12/18  Yes Florencia Reasons, MD  albuterol (PROVENTIL HFA;VENTOLIN HFA) 108 (90 Base) MCG/ACT inhaler Inhale 1 puff into the lungs every 6 (six) hours as needed for wheezing or shortness of breath. Reported on 06/01/2016 11/15/16  Yes Golden Circle, FNP  apixaban (ELIQUIS) 5 MG  TABS tablet Take 1 tablet (5 mg total) by mouth 2 (two) times daily. 10/08/18  Yes Hilty, Nadean Corwin, MD  atorvastatin (LIPITOR) 40 MG tablet TAKE 1 TABLET(40 MG) BY MOUTH DAILY Patient taking differently: Take 40 mg by mouth daily.  12/07/18  Yes Hoyt Koch, MD  DIGOX 125 MCG tablet TAKE 1 TABLET(0.125 MG) BY MOUTH DAILY Patient taking differently: Take 0.125 mg by mouth daily.  02/12/18  Yes Hilty, Nadean Corwin, MD  diltiazem (CARDIZEM CD) 120 MG 24 hr capsule Take 1 capsule (120 mg total) by mouth daily. 12/07/18  Yes Hoyt Koch, MD  ferrous sulfate 325 (65 FE) MG tablet Take 325 mg by mouth every evening.    Yes [provider]  furosemide (LASIX) 80 MG tablet Take 1 tablet (80 mg total) by mouth 2 (two) times daily. 11/12/18  Yes Florencia Reasons, MD  HYDROcodone-homatropine [facility]) 5-1.5 MG/5ML syrup Take 5 mLs by mouth every 4 (four) hours as needed for cough (SOB). Patient taking differently: Take 5 mLs by mouth every 4 (four) hours as needed for cough (or "shortness of breath").  12/11/18  Yes Hoyt Koch, MD  ipratropium-albuterol (DUONEB) 0.5-2.5 (3) MG/3ML SOLN Take 3 mLs by nebulization 4 (four) times daily as needed. Patient taking differently: Take 3 mLs by nebulization daily as needed (shortness of breath).  09/19/17  Yes Hoyt Koch, MD  lactose free nutrition (BOOST) LIQD Take 237 mLs by mouth daily as needed (for supplementation).    Yes [provider]  loratadine (CLARITIN) 10 MG tablet TAKE 1 TABLET BY MOUTH AT BEDTIME Patient taking differently: Take 10 mg by mouth at bedtime.  12/07/18  Yes Hoyt Koch, MD  mirtazapine (REMERON) 15 MG tablet TAKE 1 TABLET(15 MG) BY MOUTH AT BEDTIME Patient taking differently: Take 15 mg by mouth at bedtime.  12/07/18  Yes Hoyt Koch, MD  Multiple Vitamins-Minerals (MULTIVITAMIN GUMMIES ADULT) [place] 1  tablet by mouth daily.   Yes [provider]  OXYGEN Inhale 2-3 L  into the lungs continuous.    Yes [provider]  pantoprazole (PROTONIX) 40 MG tablet TAKE 1 TABLET(40 MG) BY MOUTH DAILY Patient taking differently: Take 40 mg by mouth daily.  10/08/18  Yes Hoyt Koch, MD  potassium chloride SA (K-DUR,KLOR-CON) 20 MEQ tablet Take 2 tablets (40 mEq total) by mouth every Monday, Wednesday, and Friday. 12/07/18  Yes Hoyt Koch, MD  sertraline (ZOLOFT) 25 MG tablet Take 1-2 tablets (25-50 mg total) by mouth daily. Patient taking differently: Take 50 mg by mouth daily.  09/12/18  Yes Hoyt Koch, MD  tolterodine (DETROL LA) 4 MG 24 hr capsule Take 1 capsule (4 mg total) by mouth daily. Patient taking differently: Take 4 mg by mouth every evening.  06/06/18  Yes Hoyt Koch, MD  guaiFENesin (MUCINEX) 600 MG 12 hr tablet Take 1 tablet (600 mg total) by mouth 2 (two) times daily. Patient not taking: Reported on 12/12/2018 11/12/18 11/12/19  Florencia Reasons, MD  magnesium gluconate (MAGONATE) 30 MG tablet Take 1 tablet (30 mg total) by mouth daily. Patient not taking: Reported on 12/12/2018 11/12/18   Florencia Reasons, MD   No Known Allergies Review of Systems  Unable to perform ROS: Other    Physical Exam Vitals signs and nursing note reviewed.  Constitutional:      Appearance: She is normal weight.  HENT:     Head: Normocephalic and atraumatic.  Neck:     Musculoskeletal: Normal range of motion.  Cardiovascular:     Rate and Rhythm: Rhythm irregular.  Pulmonary:     Comments: Increased work of breathing Moist cough Skin:    General: Skin is warm and dry.  Neurological:     General: No focal deficit present.     Mental Status: She is alert and oriented to person, place, and time.  Psychiatric:        Mood and Affect: Mood normal.     Vital Signs: BP 127/60   Pulse 85   Temp 97.7 F (36.5 C) (Oral)   Resp (!) 22   Ht 5\' 6"  (1.676 m)   Wt 65 kg   LMP  (LMP Unknown)   SpO2 98%   BMI 23.11 kg/m  Pain Scale:  0-10   Pain Score: Asleep   SpO2: SpO2: 98 % O2 Device:SpO2: 98 % O2 Flow Rate: .O2 Flow Rate (L/min): 2 L/min  IO: Intake/output summary:   Intake/Output Summary (Last 24 hours) at 12/14/2018 1452 Last data filed at 12/14/2018 1353 Gross per 24 hour  Intake 960 ml  Output 801 ml  Net 159 ml    LBM: Last BM Date: 12/10/18 Baseline Weight: Weight: 64.8 kg Most recent weight: Weight: 65 kg     Palliative Assessment/Data:   Flowsheet Rows     Most Recent Value  Intake Tab  Referral Department  Hospitalist  Unit at Time of Referral  Med/Surg Unit  Palliative Care Primary Diagnosis  Pulmonary  Date Notified  12/13/18  Palliative Care Type  Return patient Palliative Care  Date of Admission  12/13/18  Date first seen by Palliative Care  12/14/18  # of days Palliative referral response time  1 Day(s)  # of days IP prior to Palliative referral  0  Clinical Assessment  Palliative Performance Scale Score  40%  Pain Max last 24 hours  Not able to report  Pain Min Last 24  hours  Not able to report  Dyspnea Max Last 24 Hours  Not able to report  Dyspnea Min Last 24 hours  Not able to report  Nausea Max Last 24 Hours  Not able to report  Nausea Min Last 24 Hours  Not able to report  Anxiety Max Last 24 Hours  Not able to report  Anxiety Min Last 24 Hours  Not able to report  Other Max Last 24 Hours  Not able to report  Psychosocial & Spiritual Assessment  Palliative Care Outcomes  Patient/Family meeting held?  Yes  Who was at the meeting?  pt. dtr  Palliative Care follow-up planned  Yes, Facility      Time In: 1400 Time Out: 1510 Time Total: 70 min Greater than 50%  of this time was spent counseling and coordinating care related to the above assessment and plan. Stafed with Dr. Landis Gandy  Signed by: Dory Horn, NP   Please contact Palliative Medicine Team phone at [phone] for questions and concerns.  For individual provider: See  Shea Evans

## 2018-12-14 NOTE — Progress Notes (Signed)
Physical Therapy Treatment Patient Details Name: Pamela Mcguire MRN: [id number] DOB: May 29, 1935 Today's Date: 12/14/2018    History of Present Illness Pt is an 84 y.o. female admitted 12/12/18 for evaluation of near syncope resulting in fall with LLE pain. Pt also with SOB, afib with RVR and chest pain; EKG revealed ischemic changes; cardiology ruled out STEMI. L knee imaging with small joint effusion; hip imaging (-) for fx. PMH includes HTN, CAD, dementia, afib, arthritis, anxiety; per palliative, poor prognosis secondary to non-small cell lung cancer with recurrent right pleural effusion.   PT Comments    Pt slowly progressing with mobility. Able to increase ambulation distance with RW and close min guard for balance due to c/o continued dizziness; ambulatory BP 155/61, down to 134/71 upon return to room. Pt limited by generalized weakness, fatigue, L knee pain and decreased activity tolerance. Daughter reports not interested in SNF-level therapies upon discharge; currently trying to decide between ALF or return home with family support. Will continue to follow acutely.    Follow Up Recommendations  SNF;Supervision for mobility/OOB     Equipment Recommendations  None recommended by PT    Recommendations for Other Services       Precautions / Restrictions Precautions Precautions: Fall;Other (comment) Precaution Comments: Orthostatic hypotension Restrictions Weight Bearing Restrictions: No    Mobility  Bed Mobility Overal bed mobility: Modified Independent             General bed mobility comments: Increased time and effort; HOB elevated  Transfers Overall transfer level: Needs assistance Equipment used: Rolling walker (2 wheeled) Transfers: Sit to/from Stand Sit to Stand: Supervision         General transfer comment: Supervision to stand from EOB and toilet to RW; heavy reliance on UE support  Ambulation/Gait Ambulation/Gait assistance: Min guard Gait Distance  (Feet): 120 Feet Assistive device: Rolling walker (2 wheeled) Gait Pattern/deviations: Step-to pattern;Trunk flexed;Antalgic Gait velocity: Decreased Gait velocity interpretation: <1.8 ft/sec, indicate of risk for recurrent falls General Gait Details: Slow, antalgic ambwith RW and close min guard for balance secondary to pt's c/o dizziness; pt required seated rest break in hallway with dizzines, BP 155/61. BP 134/71 upon return to room   Stairs             Wheelchair Mobility    Modified Rankin (Stroke Patients Only)       Balance Overall balance assessment: Needs assistance   Sitting balance-Leahy Scale: Fair       Standing balance-Leahy Scale: Poor Standing balance comment: Reliant on UE support                            Cognition Arousal/Alertness: Awake/alert Behavior During Therapy: WFL for tasks assessed/performed Overall Cognitive Status: Within Functional Limits for tasks assessed                                 General Comments: WFL for simple tasks; HOH      Exercises      General Comments General comments (skin integrity, edema, etc.): Family present      Pertinent Vitals/Pain Pain Assessment: Faces Faces Pain Scale: Hurts little more Pain Location: L knee Pain Descriptors / Indicators: Sore;Grimacing;Guarding;Moaning Pain Intervention(s): Monitored during session;Limited activity within patient's tolerance    Home Living  Prior Function            PT Goals (current goals can now be found in the care plan section) Acute Rehab PT Goals Patient Stated Goal: Figure out plans for discharge (ALF vs. HH); family not interested in SNF PT Goal Formulation: With patient/family Time For Goal Achievement: 12/27/18 Potential to Achieve Goals: Fair Progress towards PT goals: Progressing toward goals    Frequency    Min 3X/week      PT Plan Current plan remains appropriate     Co-evaluation              AM-PAC PT "6 Clicks" Mobility   Outcome Measure  Help needed turning from your back to your side while in a flat bed without using bedrails?: None Help needed moving from lying on your back to sitting on the side of a flat bed without using bedrails?: None Help needed moving to and from a bed to a chair (including a wheelchair)?: A Little Help needed standing up from a chair using your arms (e.g., wheelchair or bedside chair)?: A Little Help needed to walk in hospital room?: A Little Help needed climbing 3-5 steps with a railing? : A Lot 6 Click Score: 19    End of Session Equipment Utilized During Treatment: Gait belt;Oxygen Activity Tolerance: Patient tolerated treatment well;Patient limited by pain Patient left: in bed;with call bell/phone within reach;with bed alarm set;with family/visitor present Nurse Communication: Mobility status PT Visit Diagnosis: Other abnormalities of gait and mobility (R26.89)     Time: 0931-1216 PT Time Calculation (min) (ACUTE ONLY): 35 min  Charges:  $Gait Training: 8-22 mins $Therapeutic Activity: 8-22 mins                    Mabeline Caras, PT, DPT Acute Rehabilitation Services  Pager [phone] Office [place] 12/14/2018, 5:16 PM

## 2018-12-14 NOTE — Progress Notes (Signed)
RT instructed pt on the use of flutter valve.  Pt able to demonstrate back good technique.

## 2018-12-14 NOTE — Discharge Instructions (Addendum)
Patient will need close monitoring of her volume status, patient to weight herself daily, and to take extra dose 40 mg of Lasix for that day if she has any more than 3 pounds weight gain, as well need to be instructed to hold her Lasix for that day of her weight dropped by 2 pounds her ideal body weight of 145 -Patient to continue with thigh-high TED hose bilaterally to avoid recurrent orthostasis.         Information on my medicine - ELIQUIS (apixaban)  Why was Eliquis prescribed for you? Eliquis was prescribed for you to reduce the risk of a blood clot forming that can cause a stroke if you have a medical condition called atrial fibrillation (a type of irregular heartbeat).  What do You need to know about Eliquis ? Take your Eliquis TWICE DAILY - one tablet in the morning and one tablet in the evening with or without food. If you have difficulty swallowing the tablet whole please discuss with your pharmacist how to take the medication safely.  Take Eliquis exactly as prescribed by your doctor and DO NOT stop taking Eliquis without talking to the doctor who prescribed the medication.  Stopping may increase your risk of developing a stroke.  Refill your prescription before you run out.  After discharge, you should have regular check-up appointments with your healthcare provider that is prescribing your Eliquis.  In the future your dose may need to be changed if your kidney function or weight changes by a significant amount or as you get older.  What do you do if you miss a dose? If you miss a dose, take it as soon as you remember on the same day and resume taking twice daily.  Do not take more than one dose of ELIQUIS at the same time to make up a missed dose.  Important Safety Information A possible side effect of Eliquis is bleeding. You should call your healthcare provider right away if you experience any of the following: ? Bleeding from an injury or your nose that does not  stop. ? Unusual colored urine (red or dark brown) or unusual colored stools (red or black). ? Unusual bruising for unknown reasons. ? A serious fall or if you hit your head (even if there is no bleeding).  Some medicines may interact with Eliquis and might increase your risk of bleeding or clotting while on Eliquis. To help avoid this, consult your healthcare provider or pharmacist prior to using any new prescription or non-prescription medications, including herbals, vitamins, non-steroidal anti-inflammatory drugs (NSAIDs) and supplements.  This website has more information on Eliquis (apixaban): http://www.eliquis.com/eliquis/home

## 2018-12-14 NOTE — Progress Notes (Signed)
Progress Note    Pamela Mcguire  LDJ:[id number] DOB: 1935-10-31  DOA: 12/12/2018 PCP: Hoyt Koch, MD    Brief Narrative:    Pamela Mcguire is an 84 y.o. female with medical history significant of A. fib, CAD, depression, hypertension, hyperlipidemia, GERD, recurrent pleural effusion, lung cancer following with Dr. Earlie Server, presenting to the hospital via EMS from HOME for evaluation of syncope.,  Her work-up was significant for orthostasis, low blood pressure, she was admitted for further work-up.   Subjective:   Patient reports she is feeling very weak, having cough, nonproductive   Assessment/Plan:   Active Problems:   A-fib (HCC)   HTN (hypertension)   HLD (hyperlipidemia)   Near syncope   Knee pain   Hip pain  Near-syncope -This is most likely in the setting of orthostasis, as story is consistent with it specially with recently her legs has been increased in outpatient setting. -2D echo with no evidence of regional wall motion abnormalities, and preserved EF -Was hypotensive yesterday setting 81/33, continue to hold her diuresis, given her chronic recurrent effusion, I will hold initiating any IV fluids.   Persistent pleural effusion -At this point appears to be at baseline, contributing to any hypoxia or dyspnea, so no indication for thoracentesis, and certainly there is no room to increase diuresis especially in the setting of her orthostasis. Per PCCM from last admission: Impression/Plan: Persistent pleural effusion on the right: this has been present for months. As such it will never go away.  I don't think it had anything to do with her pathology from this admission as she was admitted with a left sided CAP from H. Flu.   > would not perform thoracentesis again > monitor with repeat imaging as coordinating for cancer surveillance in oncology clinic Her hemoptysis bothers me because of her history of lung cancer.  I would like to perform a bronchoscopy but I  think she is too frail to go through that right now as she continues to recover from CAP. > hold eliquis > complete treatment for CAP > needs f/u with Dr. Earlie Server and likely PET scan > if she gains strength we can perform a bronchoscopy  H/o lung cancer -per family Dr. Inda Merlin has told them there is nothing that can be done -last note from D. Mohammad: ) Stage IIIA non-small cell lung cancer, adenocarcinoma status post concurrent chemoradiation with partial response and she was not considered a good candidate for consolidation chemotherapy. The patient is currently on observation. The patient has a repeat CT scan of the chest that showed no concerning findings for disease progression except for the enlarging right pleural effusion.  I discussed the scan results with the patient and her daughter and recommended for her to proceed with ultrasound-guided right thoracentesis for diagnostic and therapeutic purposes.  If the cytology of the pleural fluid is negative for malignancy, I will see her back for follow-up visit in 6 months for evaluation with repeat CT scan of the chest. 2) stage II (T3, N0, Mx) colon adenocarcinoma status post partial colectomy with lymph node dissection. The patient will continue on observation for now. She was advised to call immediately if she has any concerning symptoms in the interval.  Skin tear -WOC consult  Chronic respiratory failure due to hypoxia -continue home O2  Hypokalemia -replete  Left knee pain secondary to fall X-ray showing small joint effusion; no acute fracture or dislocation. -Tylenol PRN -trial of voltaren gel  Left hip pain secondary to  fall X-ray showing no acute fracture or dislocation. -Tylenol PRN  Paroxysmal A. fib - rate controlled with heart rate in the 80s.  - Continue home Eliquis, digoxin -holding parameters on diltiazem.   Hyperlipidemia -Continue home statin  GERD -Continue PPI  Depression -Continue  Zoloft  Goals of care:  -Dr. Eliseo Squires discussed with family yesterday, and they do request palliative care consult for goals of care, will await their care consult regarding recommendation for placement and goals of care and symptoms management.   Family Communication/Anticipated D/C date and plan/Code Status   DVT prophylaxis: eliquis Code Status: dnr Family Communication: multiple at bedside Disposition Plan: Was seen by PT, recommendation for SNF placement .   Medical Consultants:    Palliative care   Objective:    Vitals:   12/14/18 0034 12/14/18 0500 12/14/18 0602 12/14/18 0832  BP: 122/60  (!) 145/67 128/86  Pulse: 92  99 87  Resp: 18  18 (!) 22  Temp: 98.4 F (36.9 C)  98.2 F (36.8 C) 97.7 F (36.5 C)  TempSrc: Oral  Oral Oral  SpO2: 96%  99% 95%  Weight:  65 kg    Height:        Intake/Output Summary (Last 24 hours) at 12/14/2018 1136 Last data filed at 12/14/2018 1109 Gross per 24 hour  Intake 960 ml  Output 801 ml  Net 159 ml   Filed Weights   12/12/18 2215 12/13/18 0420 12/14/18 0500  Weight: 64.2 kg 64.2 kg 65 kg    Exam:  Awake Alert, Oriented X 3, frail, chronically ill-appearing  symmetrical Chest wall movement, air entry at the bases, no wheezing RRR,No Gallops,Rubs or new Murmurs, No Parasternal Heave +ve B.Sounds, Abd Soft, No tenderness, No rebound - guarding or rigidity. No Cyanosis, Clubbing or edema, No new Rash or bruise     Data Reviewed:   I have personally reviewed following labs and imaging studies:  Labs: Labs show the following:   Basic Metabolic Panel: Recent Labs  Lab 12/12/18 1850 12/13/18 0614 12/14/18 0503  NA 135 137 139  K 4.0 3.3* 4.2  CL 93* 96* 100  CO2 28 28 29   GLUCOSE 106* 98 116*  BUN 11 12 11   CREATININE 1.09* 1.07* 0.94  CALCIUM 8.2* 7.5* 8.3*   GFR Estimated Creatinine Clearance: 43.9 mL/min (by C-G formula based on SCr of 0.94 mg/dL). Liver Function Tests: Recent Labs  Lab 12/14/18 0503   AST 24  ALT 16  ALKPHOS 48  BILITOT 0.5  PROT 6.0*  ALBUMIN 2.7*   No results for input(s): LIPASE, AMYLASE in the last 168 hours. No results for input(s): AMMONIA in the last 168 hours. Coagulation profile Recent Labs  Lab 12/12/18 1629  INR 1.62    CBC: Recent Labs  Lab 12/12/18 1629 12/14/18 0503  WBC 8.4 6.2  NEUTROABS 7.0  --   HGB 11.8* 11.9*  HCT 36.0 35.7*  MCV 90.9 90.8  PLT 307 306   Cardiac Enzymes: Recent Labs  Lab 12/12/18 2234 12/13/18 0614 12/13/18 0919  TROPONINI 0.04* 0.03* <0.03   BNP (last 3 results) Recent Labs    09/12/18 1130  PROBNP 175.0*   CBG: No results for input(s): GLUCAP in the last 168 hours. D-Dimer: No results for input(s): DDIMER in the last 72 hours. Hgb A1c: No results for input(s): HGBA1C in the last 72 hours. Lipid Profile: No results for input(s): CHOL, HDL, LDLCALC, TRIG, CHOLHDL, LDLDIRECT in the last 72 hours. Thyroid function studies: No  results for input(s): TSH, T4TOTAL, T3FREE, THYROIDAB in the last 72 hours.  Invalid input(s): FREET3 Anemia work up: Recent Labs    12/13/18 0614  FERRITIN 260  TIBC 223*  IRON 89   Sepsis Labs: Recent Labs  Lab 12/12/18 1629 12/14/18 0503  WBC 8.4 6.2    Microbiology No results found for this or any previous visit (from the past 240 hour(s)).  Procedures and diagnostic studies:  Dg Knee 2 Views Left  Result Date: 12/12/2018 CLINICAL DATA:  84 year old female status post fall with pain. EXAM: LEFT KNEE - 1-2 VIEW COMPARISON:  None. FINDINGS: Small joint effusion on the cross-table lateral view. No fat fluid level identified. Patella appears intact. No fracture or dislocation identified. Joint spaces appear normal for age. IMPRESSION: Small joint effusion. No acute fracture or dislocation identified about the left knee. Electronically Signed   By: Genevie Ann M.D.   On: 12/12/2018 17:46   Ct Head Wo Contrast  Result Date: 12/12/2018 CLINICAL DATA:  84 year old  female with dizziness and near syncopal event resulting in a fall earlier today. She has a posterior scalp hematoma. EXAM: CT HEAD WITHOUT CONTRAST TECHNIQUE: Contiguous axial images were obtained from the base of the skull through the vertex without intravenous contrast. COMPARISON:  Prior brain MRI 11/08/2018 FINDINGS: Brain: No evidence of acute infarction, hemorrhage, hydrocephalus, extra-axial collection or mass lesion/mass effect. Mild cortical atrophy. Multiple circumscribed low-attenuation lesions present throughout the left basal ganglia with perhaps a few on the right. These may represent dilated perivascular spaces or remote lacunar infarct. Chronic microvascular ischemic white matter disease again noted. Vascular: No hyperdense vessel or unexpected calcification. Skull: Normal. Negative for fracture or focal lesion. Sinuses/Orbits: No acute finding. Other: None. IMPRESSION: 1. No acute intracranial abnormality. 2. Multifocal subacute in remote punctate lacunar infarcts, cortical atrophy and chronic microvascular ischemic white matter disease. Electronically Signed   By: Jacqulynn Cadet M.D.   On: 12/12/2018 17:28   Dg Chest Port 1 View  Result Date: 12/13/2018 CLINICAL DATA:  Dyspnea EXAM: PORTABLE CHEST 1 VIEW COMPARISON:  12/12/2018 FINDINGS: Cardiac shadow is stable. Aortic calcifications are again seen. Persistent elevation of the right hemidiaphragm is noted consistent with the sub pulmonic effusion. Chronic fibrotic changes are noted most marked in the right upper lobe. No new focal abnormality is seen. IMPRESSION: Stable appearance of the chest when compared with the previous day. Electronically Signed   By: Inez Catalina M.D.   On: 12/13/2018 16:01   Dg Chest Port 1 View  Result Date: 12/12/2018 CLINICAL DATA:  84 year old female with history of dizziness. Near syncopal event today. Fall. EXAM: PORTABLE CHEST 1 VIEW COMPARISON:  Chest x-ray 11/06/2018. FINDINGS: Chronic elevation of the  right hemidiaphragm, unchanged. Skinfold artifact over the lateral aspect of the right hemithorax incidentally noted. Widespread but heterogeneous distribution of interstitial prominence throughout the lungs bilaterally, similar to the prior study. No acute consolidative airspace disease. No pleural effusions. Probable subpulmonic right-sided pleural effusion, similar to priors. No left pleural effusion. No evidence of pulmonary edema. Heart size is normal. Aortic atherosclerosis. IMPRESSION: 1. Chronic right-sided small sub pulmonic pleural effusion, similar to the prior study. 2. Aortic atherosclerosis. Electronically Signed   By: Vinnie Langton M.D.   On: 12/12/2018 17:01   Dg Hip Unilat With Pelvis 2-3 Views Left  Result Date: 12/12/2018 CLINICAL DATA:  84 year old female status post fall with pain. EXAM: DG HIP (WITH OR WITHOUT PELVIS) 2-3V LEFT COMPARISON:  CT Abdomen and Pelvis 10/30/2018 and earlier.  FINDINGS: Bone mineralization is within normal limits for age. Femoral heads are normally located. Hip joint spaces appear symmetric and normal for age. Grossly intact proximal right femur. The proximal left femur appears intact. No pelvic fracture identified. Negative visible bowel gas pattern, lower abdominal and pelvic visceral contours. IMPRESSION: No acute fracture or dislocation identified about the left hip or pelvis. Electronically Signed   By: Genevie Ann M.D.   On: 12/12/2018 17:45    Medications:   . apixaban  5 mg Oral BID  . atorvastatin  40 mg Oral Daily  . diclofenac sodium  2 g Topical QID  . digoxin  0.125 mg Oral Daily  . diltiazem  120 mg Oral Daily  . ferrous sulfate  325 mg Oral QPM  . fesoterodine  4 mg Oral Daily  . guaiFENesin  600 mg Oral BID  . loratadine  10 mg Oral QHS  . mirtazapine  15 mg Oral QHS  . multivitamin with minerals  1 tablet Oral Daily  . pantoprazole  40 mg Oral Daily  . sertraline  50 mg Oral Daily   Continuous Infusions:   LOS: 1 day   Jas Betten  [facility]   *Please refer to [email or web address], password [email or web address] to get updated schedule on who will round on this patient, as hospitalists switch teams weekly. If 7PM-7AM, please contact night-coverage at [email or web address], password [email or web address] for any overnight needs.  12/14/2018, 11:36 AM

## 2018-12-15 DIAGNOSIS — R0602 Shortness of breath: Secondary | ICD-10-CM | POA: Diagnosis not present

## 2018-12-15 DIAGNOSIS — Z515 Encounter for palliative care: Secondary | ICD-10-CM | POA: Diagnosis not present

## 2018-12-15 DIAGNOSIS — C3411 Malignant neoplasm of upper lobe, right bronchus or lung: Secondary | ICD-10-CM | POA: Diagnosis not present

## 2018-12-15 DIAGNOSIS — Z7189 Other specified counseling: Secondary | ICD-10-CM | POA: Diagnosis not present

## 2018-12-15 DIAGNOSIS — R55 Syncope and collapse: Secondary | ICD-10-CM | POA: Diagnosis not present

## 2018-12-15 MED ORDER — SODIUM CHLORIDE 0.9 % IV SOLN: INTRAVENOUS | Status: DC

## 2018-12-15 MED ORDER — SODIUM CHLORIDE 0.9 % IV SOLN
INTRAVENOUS | Status: DC
  Administered 2018-12-15: 13:00:00 via INTRAVENOUS

## 2018-12-15 NOTE — Evaluation (Signed)
Occupational Therapy Evaluation Patient Details Name: Pamela Mcguire MRN: [id number] DOB: February 24, 1935 Today's Date: 12/15/2018    History of Present Illness Pt is an 84 y.o. female admitted 12/12/18 for evaluation of near syncope resulting in fall with LLE pain. Pt also with SOB, afib with RVR and chest pain; EKG revealed ischemic changes; cardiology ruled out STEMI. L knee imaging with small joint effusion; hip imaging (-) for fx. PMH includes HTN, CAD, dementia, afib, arthritis, anxiety; per palliative, poor prognosis secondary to non-small cell lung cancer with recurrent right pleural effusion.   Clinical Impression   Pt lives at home with her daughter and has assist for bathing and 24 hour sup. Pt was a resident of [facility] and was recently at [facility] for rehab. Pt uses RW at home. Pt reports dizziness in supine, sitting EOB, and with mobility to BSC using RW. Pt currently min guard A with functional mobility using RW, sup with toileting, set up/sup eith UB ADLs/grooming and min A with LB ADLs. No further acute OT is indicated at this time, will defer any further OT intervention to SNF.    Follow Up Recommendations  SNF;Supervision/Assistance - 24 hour    Equipment Recommendations  Other (comment)(TBD at next venue of care)    Recommendations for Other Services       Precautions / Restrictions Precautions Precautions: Fall;Other (comment) Precaution Comments: Orthostatic hypotension Restrictions Weight Bearing Restrictions: No      Mobility Bed Mobility Overal bed mobility: Modified Independent             General bed mobility comments: Increased time and effort; HOB elevated  Transfers Overall transfer level: Needs assistance Equipment used: Rolling walker (2 wheeled) Transfers: Sit to/from Stand Sit to Stand: Min guard         General transfer comment: pt c/o dizziness    Balance Overall balance assessment: Needs assistance Sitting-balance  support: No upper extremity supported;Feet supported Sitting balance-Leahy Scale: Fair     Standing balance support: During functional activity;Bilateral upper extremity supported Standing balance-Leahy Scale: Poor                             ADL either performed or assessed with clinical judgement   ADL Overall ADL's : Needs assistance/impaired     Grooming: Wash/dry hands;Wash/dry face;Sitting;Supervision/safety   Upper Body Bathing: Set up;Supervision/ safety Upper Body Bathing Details (indicate cue type and reason): simulated Lower Body Bathing: Minimal assistance Lower Body Bathing Details (indicate cue type and reason): due to dizziness Upper Body Dressing : Set up;Supervision/safety;Sitting   Lower Body Dressing: Minimal assistance Lower Body Dressing Details (indicate cue type and reason): due to dizziness Toilet Transfer: Min guard;Ambulation;RW;BSC   Toileting- Clothing Manipulation and Hygiene: Supervision/safety;Sitting/lateral lean       Functional mobility during ADLs: Min guard General ADL Comments: pt reports dizziness in supine, sitting and standing     Vision Patient Visual Report: No change from baseline       Perception     Praxis      Pertinent Vitals/Pain Pain Assessment: Faces Faces Pain Scale: Hurts little more Pain Location: R shoulder Pain Descriptors / Indicators: Grimacing;Guarding;Sore Pain Intervention(s): Limited activity within patient's tolerance;Monitored during session;Repositioned     Hand Dominance Right   Extremity/Trunk Assessment Upper Extremity Assessment Upper Extremity Assessment: Generalized weakness   Lower Extremity Assessment Lower Extremity Assessment: Defer to PT evaluation   Cervical / Trunk Assessment Cervical /  Trunk Assessment: Kyphotic   Communication Communication Communication: HOH   Cognition Arousal/Alertness: Awake/alert Behavior During Therapy: WFL for tasks  assessed/performed Overall Cognitive Status: (hx of dementia)                                 General Comments: pt unable to recall that she fell on 12/12/2018 or how much progress she made at her recent stay at [facility] for rehab   General Comments       Exercises     Shoulder Instructions      Home Living Family/patient expects to be discharged to:: Unsure Living Arrangements: [place]: [name] - 4 wheels   Additional Comments: Pt is resident at [facility], living alone in [place] apartment, mod indep with mobility. Pt with recent admission to [facility], since then has been staying at daughter's home for increased supervision/assist with ADLs from ans aide. Unsure if will d/c back to daughter's home or ILF      Prior Functioning/Environment    Gait / Transfers Assistance Needed: Ambulatory with rollator, ambulates to dining room at [facility]. Fall leading to admission was only fall pt reports ADL's / Homemaking Assistance Needed: Indep with ADLs, ILF only assists with medications. Since staying at daughter's home, pt performing seated showers with supervision            OT Problem List: Decreased strength;Decreased activity tolerance;Decreased cognition;Decreased knowledge of use of DME or AE;Impaired balance (sitting and/or standing);Pain      OT Treatment/Interventions: Self-care/ADL training    OT Goals(Current goals can be found in the care plan section) Acute Rehab OT Goals Patient Stated Goal: go home OT Goal Formulation: All assessment and education complete, DC therapy  OT Frequency:     Barriers to D/C: Decreased caregiver support          Co-evaluation              AM-PAC OT "6 Clicks" Daily Activity     Outcome Measure Help from another person eating meals?: None Help from another person taking care of personal grooming?: None Help from another person toileting, which includes  using toliet, bedpan, or urinal?: A Little Help from another person bathing (including washing, rinsing, drying)?: A Little Help from another person to put on and taking off regular upper body clothing?: None Help from another person to put on and taking off regular lower body clothing?: A Little 6 Click Score: 21   End of Session Equipment Utilized During Treatment: Gait belt;Rolling walker;Other (comment)(BSC)  Activity Tolerance: Other (comment);Patient limited by fatigue(dizziness) Patient left: in bed;with call bell/phone within reach;with bed alarm set  OT Visit Diagnosis: Other abnormalities of gait and mobility (R26.89);History of falling (Z91.81);Muscle weakness (generalized) (M62.81);Pain Pain - Right/Left: Right Pain - part of body: Shoulder                Time: 1035-1059 OT Time Calculation (min): 24 min Charges:  OT General Charges $OT Visit: 1 Visit OT Evaluation $OT Eval Moderate Complexity: 1 Mod OT Treatments $Self Care/Home Management : 8-22 mins    Emmit Alexanders [facility] 12/15/2018, 11:34 AM

## 2018-12-15 NOTE — Care Management CC44 (Signed)
Condition Code 44 Documentation Completed  Patient Details  Name: Pamela Mcguire MRN: [id number] Date of Birth: 1935/06/22   Condition Code 44 given:  Yes Patient signature on Condition Code 44 notice:  Yes Documentation of 2 MD's agreement:  Yes Code 44 added to claim:  Yes    Carles Collet, RN 12/15/2018, 11:35 AM

## 2018-12-15 NOTE — Progress Notes (Signed)
[facility] ° °Received request from PMT NP Sarah Bullard, confirmed by RNCM Debbie for family interest in hospice services at home after discharge. Chart under review by hospice physician. Eligibility pending at this time. Spoke with daughter Denise by phone x2. Met with patient at bedside. Debbie was not present.  ° °Per Debbie plan is for patient to return to Debbie's home upon release from the hospital. Debbie is not sure when patient will be transitioned to ALF. She has requested BSC to be delivered to her home. She already has oxygen from [facility] which will need to be switched. Debbie is aware and agreeable. Debbie's address is the address in Epic chart and has been given to [facility] to coordinate with Debbie for delivery of BSC to home. Per RNCM, plan is for patient to transport home via car.  ° °Please send home with patient signed and completed out of facility DNR form.  ° °Please send home with patient scripts for comfort medications. ° °[facility] referral center is aware of this referral and will contact Debbie to arrange RN visit in the home after discharge. ° °Will followup with Debbie and RNCM after eligibility is confirmed.  ° °Thank you,  °Eva Davis, LCSW °[phone]  °  °

## 2018-12-15 NOTE — Progress Notes (Signed)
  Patient seen, chart reviewed.  Met with patient's daughter [name], as well as daughter Arrie Aran and brother Elenore Rota via telephone conference to discuss goals of care and next steps.  Family confirmed that patient is a DNR.  They have been struggling to come to terms with their mother's illness and understanding different aspects of her current medical condition such as recurrent pleural effusion.  Dr. Waldron Labs did join [place] as well which was very helpful for family to appreciate their mothers medical picture.  Patient is still very weak, orthostatic hypotension, dizziness, has been a long standing issue.  She is getting some extra IV fluids today.  Patient states "I feel terrible" but denies pain or shortness of breath.  Discussed hospice benefit in the home care setting.  I do believe that patient would qualify for her hospice in-home benefit given her diagnoses of non-small cell lung cancer, recurrent pleural effusions (not malignant) fluid balance difficulties, protein calorie malnutrition with an albumin of 2.7.  We also discussed as  condition declines, hospice hospitals as an additional resource  Family did elect to be evaluated by [facility] of [facility] (offered choice per Medicare guidelines of hospice organizations) for support at [facility] assisted living.  Patient was living in independent living but given clinical decline, family has located an assisted living bed at the facility.  [facility] are able to see patient in the facility and I have put both a consult into case management as well as calling hospice and palliative care of [facility] directly to arrange admission to their services before she is discharged.  Patient likely will be discharged on 12/16/2018.  Answered family's questions and they feel that the additional support from hospice will be helpful.  Thank you for the consult Romona Curls, NP Time spent: 40 minutes Greater  than 50% of the time was spent in counseling and coordination of care

## 2018-12-15 NOTE — Progress Notes (Signed)
Progress Note    Pamela Mcguire  NTI:[id number] DOB: [birth year]  DOA: 12/12/2018 PCP: Hoyt Koch, MD    Brief Narrative:    Pamela Mcguire is an 84 y.o. female with medical history significant of A. fib, CAD, depression, hypertension, hyperlipidemia, GERD, recurrent pleural effusion, lung cancer following with Dr. Earlie Server, presenting to the hospital via EMS from HOME for evaluation of syncope.,  Her work-up was significant for orthostasis, low blood pressure, she was admitted for further work-up.   Subjective:   Patient today reporting she is still very weak, having cough, nonproductive   Assessment/Plan:   Active Problems:   A-fib (HCC)   HTN (hypertension)   Malignant neoplasm of upper lobe of right lung (HCC)   HLD (hyperlipidemia)   Goals of care, counseling/discussion   Near syncope   Knee pain   Hip pain   Palliative care by specialist   Syncope  Near-syncope -This is most likely in the setting of orthostasis, as story is consistent with it specially with recently her legs has been increased in outpatient setting. -2D echo with no evidence of regional wall motion abnormalities, and preserved EF -Was hypotensive yesterday setting 81/33, continue to hold her diuresis, given her chronic recurrent effusion, I will hold initiating any IV fluids. -We will recheck orthostatics today   Persistent pleural effusion -At this point appears to be at baseline, do not think is contributing to her hypoxia or dyspnea, given its small size, no indication for thoracentesis . Per PCCM from last admission: Impression/Plan: Persistent pleural effusion on the right: this has been present for months. As such it will never go away.  I don't think it had anything to do with her pathology from this admission as she was admitted with a left sided CAP from H. Flu.   > would not perform thoracentesis again > monitor with repeat imaging as coordinating for cancer surveillance in  oncology clinic Her hemoptysis bothers me because of her history of lung cancer.  I would like to perform a bronchoscopy but I think she is too frail to go through that right now as she continues to recover from CAP. > hold eliquis > complete treatment for CAP > needs f/u with Dr. Earlie Server and likely PET scan > if she gains strength we can perform a bronchoscopy  H/o lung cancer -per family Dr. Inda Merlin has told them there is nothing that can be done -last note from D. Mohammad: ) Stage IIIA non-small cell lung cancer, adenocarcinoma status post concurrent chemoradiation with partial response and she was not considered a good candidate for consolidation chemotherapy. The patient is currently on observation. The patient has a repeat CT scan of the chest that showed no concerning findings for disease progression except for the enlarging right pleural effusion.  I discussed the scan results with the patient and her daughter and recommended for her to proceed with ultrasound-guided right thoracentesis for diagnostic and therapeutic purposes.  If the cytology of the pleural fluid is negative for malignancy, I will see her back for follow-up visit in 6 months for evaluation with repeat CT scan of the chest. 2) stage II (T3, N0, Mx) colon adenocarcinoma status post partial colectomy with lymph node dissection. The patient will continue on observation for now. She was advised to call immediately if she has any concerning symptoms in the interval.  Skin tear -WOC consult  Chronic respiratory failure due to hypoxia -continue home O2, at 2 L baseline  Hypokalemia -replete  Left knee pain secondary to fall X-ray showing small joint effusion; no acute fracture or dislocation. -Tylenol PRN -trial of voltaren gel  Left hip pain secondary to fall X-ray showing no acute fracture or dislocation. -Tylenol PRN  Paroxysmal A. fib - rate controlled with heart rate in the 80s.  - Continue home  Eliquis, digoxin -holding parameters on diltiazem.   Hyperlipidemia -Continue home statin  GERD -Continue PPI  Depression -Continue Zoloft  Goals of care:  -Dr. Eliseo Squires discussed with family yesterday, and they do request palliative care consult for goals of care, will await their care consult regarding recommendation for placement and goals of care and symptoms management.   Family Communication/Anticipated D/C date and plan/Code Status   DVT prophylaxis: eliquis Code Status: dnr Family Communication: None at bedside Disposition Plan: Was seen by PT, recommendation for SNF placement .   Medical Consultants:    Palliative care   Objective:    Vitals:   12/14/18 1146 12/14/18 1942 12/15/18 0613 12/15/18 0948  BP: 127/60 (!) 113/51 (!) 140/53 (!) 113/49  Pulse: 85 64 66 64  Resp:  18 18   Temp:  98.6 F (37 C) 98.1 F (36.7 C)   TempSrc:  Oral Oral   SpO2: 98% 97% 96%   Weight:   64.8 kg   Height:        Intake/Output Summary (Last 24 hours) at 12/15/2018 1116 Last data filed at 12/15/2018 1001 Gross per 24 hour  Intake 680 ml  Output 550 ml  Net 130 ml   Filed Weights   12/13/18 0420 12/14/18 0500 12/15/18 0613  Weight: 64.2 kg 65 kg 64.8 kg    Exam:  Awake Alert, Oriented X 3, currently ill-appearing, extremely frail Symmetrical Chest wall movement, no wheezing, diminished air entry at the bases RRR,No Gallops,Rubs or new Murmurs, No Parasternal Heave +ve B.Sounds, Abd Soft, No tenderness, No rebound - guarding or rigidity. No Cyanosis, Clubbing or edema, No new Rash or bruise      Data Reviewed:   I have personally reviewed following labs and imaging studies:  Labs: Labs show the following:   Basic Metabolic Panel: Recent Labs  Lab 12/12/18 1850 12/13/18 0614 12/14/18 0503  NA 135 137 139  K 4.0 3.3* 4.2  CL 93* 96* 100  CO2 28 28 29   GLUCOSE 106* 98 116*  BUN 11 12 11   CREATININE 1.09* 1.07* 0.94  CALCIUM 8.2* 7.5* 8.3*    GFR Estimated Creatinine Clearance: 43.9 mL/min (by C-G formula based on SCr of 0.94 mg/dL). Liver Function Tests: Recent Labs  Lab 12/14/18 0503  AST 24  ALT 16  ALKPHOS 48  BILITOT 0.5  PROT 6.0*  ALBUMIN 2.7*   No results for input(s): LIPASE, AMYLASE in the last 168 hours. No results for input(s): AMMONIA in the last 168 hours. Coagulation profile Recent Labs  Lab 12/12/18 1629  INR 1.62    CBC: Recent Labs  Lab 12/12/18 1629 12/14/18 0503  WBC 8.4 6.2  NEUTROABS 7.0  --   HGB 11.8* 11.9*  HCT 36.0 35.7*  MCV 90.9 90.8  PLT 307 306   Cardiac Enzymes: Recent Labs  Lab 12/12/18 2234 12/13/18 0614 12/13/18 0919  TROPONINI 0.04* 0.03* <0.03   BNP (last 3 results) Recent Labs    09/12/18 1130  PROBNP 175.0*   CBG: No results for input(s): GLUCAP in the last 168 hours. D-Dimer: No results for input(s): DDIMER in the last 72 hours. Hgb A1c: No results for  input(s): HGBA1C in the last 72 hours. Lipid Profile: No results for input(s): CHOL, HDL, LDLCALC, TRIG, CHOLHDL, LDLDIRECT in the last 72 hours. Thyroid function studies: No results for input(s): TSH, T4TOTAL, T3FREE, THYROIDAB in the last 72 hours.  Invalid input(s): FREET3 Anemia work up: Recent Labs    12/13/18 0614  FERRITIN 260  TIBC 223*  IRON 89   Sepsis Labs: Recent Labs  Lab 12/12/18 1629 12/14/18 0503  WBC 8.4 6.2    Microbiology No results found for this or any previous visit (from the past 240 hour(s)).  Procedures and diagnostic studies:  Dg Chest Port 1 View  Result Date: 12/13/2018 CLINICAL DATA:  Dyspnea EXAM: PORTABLE CHEST 1 VIEW COMPARISON:  12/12/2018 FINDINGS: Cardiac shadow is stable. Aortic calcifications are again seen. Persistent elevation of the right hemidiaphragm is noted consistent with the sub pulmonic effusion. Chronic fibrotic changes are noted most marked in the right upper lobe. No new focal abnormality is seen. IMPRESSION: Stable appearance of the  chest when compared with the previous day. Electronically Signed   By: Inez Catalina M.D.   On: 12/13/2018 16:01    Medications:   . apixaban  5 mg Oral BID  . atorvastatin  40 mg Oral Daily  . diclofenac sodium  2 g Topical QID  . digoxin  0.125 mg Oral Daily  . diltiazem  120 mg Oral Daily  . ferrous sulfate  325 mg Oral QPM  . fesoterodine  4 mg Oral Daily  . guaiFENesin  600 mg Oral BID  . loratadine  10 mg Oral QHS  . mirtazapine  15 mg Oral QHS  . multivitamin with minerals  1 tablet Oral Daily  . pantoprazole  40 mg Oral Daily  . sertraline  50 mg Oral Daily   Continuous Infusions:   LOS: 2 days   Exzavier Ruderman  [facility]   *Please refer to [email or web address], password [email or web address] to get updated schedule on who will round on this patient, as hospitalists switch teams weekly. If 7PM-7AM, please contact night-coverage at [email or web address], password [email or web address] for any overnight needs.  12/15/2018, 11:16 AM

## 2018-12-15 NOTE — Care Management Obs Status (Signed)
[place] NOTIFICATION   Patient Details  Name: Pamela Mcguire MRN: [id number] Date of Birth: Jul 11, 1935   Medicare Observation Status Notification Given:  Yes    Carles Collet, RN 12/15/2018, 11:35 AM

## 2018-12-15 NOTE — Care Management Note (Addendum)
Case Management Note  Patient Details  Name: Pamela Mcguire MRN: [id number] Date of Birth: 01-Sep-1935  Subjective/Objective:                 syncope   Action/Plan:  Spoke w patient at bedside. CC44 reviewed. Patient states that she is staying with her daughter as she was recently released from [facility]. (Not sure if she has additional SNF days) Previously she lived at [facility].  She has home oxygen through [facility] and a tank for transport. She states her daughter will provide transport home. Per Epic outpatient notes she is active w [facility] for at least RN service. Rudene Christians verified that she is active for PT OT [facility]HA and RN. Palliative meeting later this afternoon to discuss dispo and goals of care. LM w daughter requesting call back,  [name] Daughter [phone]   Spoke w patient's daughter. He rplan is to take her back home until she can be placed in ALF. [place] services will resume. Also reviewed CC44.   Addendum Per Romona Curls w Palliative she spoke w family who wants to use [facility] after choice for home hospice offered. Oliver Barre [facility] will contact daughter today.  No immanent needs for DC identified.   Expected Discharge Date:                  Expected Discharge Plan:     In-House Referral:     Discharge planning Services     Post Acute Care Choice:    Choice offered to:     DME Arranged:    DME Agency:     HH Arranged:    HH Agency:     Status of Service:  In process, will continue to follow  If discussed at Long Length of Stay Meetings, dates discussed:    Additional Comments:  Carles Collet, RN 12/15/2018, 11:36 AM

## 2018-12-16 DIAGNOSIS — R55 Syncope and collapse: Secondary | ICD-10-CM | POA: Diagnosis not present

## 2018-12-16 DIAGNOSIS — I4891 Unspecified atrial fibrillation: Secondary | ICD-10-CM | POA: Diagnosis not present

## 2018-12-16 MED ORDER — BENZONATATE 100 MG PO CAPS: 100.0000 mg | ORAL_CAPSULE | Freq: Three times a day (TID) | ORAL | 0 refills | Status: AC | PRN

## 2018-12-16 MED ORDER — FUROSEMIDE 40 MG PO TABS: ORAL_TABLET | ORAL | 0 refills | Status: AC

## 2018-12-16 MED ORDER — MIDODRINE HCL 5 MG PO TABS
5.0000 mg | ORAL_TABLET | Freq: Two times a day (BID) | ORAL | Status: DC
  Administered 2018-12-16: 5 mg via ORAL
  Filled 2018-12-16: qty 1

## 2018-12-16 MED ORDER — MIDODRINE HCL 5 MG PO TABS: 5.0000 mg | ORAL_TABLET | Freq: Two times a day (BID) | ORAL | 0 refills | Status: DC

## 2018-12-16 NOTE — Progress Notes (Signed)
Pt pivoted to [facility] and once seated back on the edge of the bed stated "I feel faint ish" Blood pressure documented  Provided wet wash cloth and ice water  Pt lying in bed comfortably  Paged MD to inform Will try ortho static vitals again later today

## 2018-12-16 NOTE — Progress Notes (Signed)
Hospice and [facility])  Hospice eligibility has been confirmed. Daughter [name] and [facility] is aware. Per [name], no further oncology treatment planned. DME has been ordered. Daughter aware oxygen will be switched.  Please send home with patient signed and completed out of facility DNR form.   Please send home with patient scripts for comfort medications.  [facility] referral center is aware of this referral and will contact [name] to arrange RN visit in the home after discharge.  Thank you,  Erling Conte, LCSW [phone]  [facility] hospital liaisons are listed on [place] under Hospice and [place]

## 2018-12-16 NOTE — Progress Notes (Signed)
Pt daughter [name] called  Update provided  Daughter stated to call if discharged later today

## 2018-12-16 NOTE — Progress Notes (Signed)
MD stated he completed ortho static vital signs this morning  No need to repeat  MD stated pt should be discharged later today  Informed pt family

## 2018-12-16 NOTE — Progress Notes (Signed)
Pt had no IV access, telemetry removed  Pt daughter has all belongings including AVS, DNR paper and printed prescriptions  CM spoke to family about switching oxygen  NT discharged pt via wheelchair on oxygen

## 2018-12-16 NOTE — Progress Notes (Signed)
MD stated to place thigh high TED hose on pt for at least 1 hour prior to taking ortho static vital signs  Pt aware

## 2018-12-16 NOTE — Care Management (Signed)
Received notification form [facility] that patient has been accepted.

## 2018-12-16 NOTE — Discharge Summary (Addendum)
Pamela Mcguire, is a 84 y.o. female  DOB 1935-02-26  MRN [id number].  Admission date:  12/12/2018  Admitting Physician  Shela Leff, MD  Discharge Date:  12/16/2018   Primary MD  Hoyt Koch, MD  Recommendations for primary care physician for things to follow:  -Further management per hospice -Patient will need close monitoring of her volume status, patient to weight herself daily, and to take extra dose 40 mg of Lasix for that day if she has any more than 3 pounds weight gain, as well need to be instructed to hold her Lasix for that day of her weight dropped by 2 pounds her ideal body weight of 145 -Patient to continue with thigh-high TED hose bilaterally to avoid recurrent orthostasis.   Admission Diagnosis  Syncope and collapse [R55] Abnormal ECG [R94.31] Multiple contusions [T07.XXXA] Atrial fibrillation with RVR (HCC) [I48.91] Contusion of scalp, initial encounter [S00.03XA] Fall, initial encounter [W19.XXXA] Near syncope [R55]   Discharge Diagnosis  Syncope and collapse [R55] Abnormal ECG [R94.31] Multiple contusions [T07.XXXA] Atrial fibrillation with RVR (HCC) [I48.91] Contusion of scalp, initial encounter [S00.03XA] Fall, initial encounter [W19.XXXA] Near syncope [R55]    Active Problems:   A-fib (HCC)   HTN (hypertension)   Malignant neoplasm of upper lobe of right lung (HCC)   HLD (hyperlipidemia)   Goals of care, counseling/discussion   Dyspnea   Near syncope   Knee pain   Hip pain   Palliative care by specialist   Syncope      Past Medical History:  Diagnosis Date  . AF (atrial fibrillation) ([place])   . Anxiety   . Arthritis    "thighs, hips, arms" (04/28/2015)  . Breast cancer ([place])    right mastectomy  . CAD (coronary artery disease)   . Dehydration 12/06/2016  . Dementia ([place])   . Depression   . Diverticulitis   . GERD (gastroesophageal reflux disease)    . HCAP (healthcare-associated pneumonia) 09/15/2015  . Hyperlipidemia   . Hypertension   . Non-small cell carcinoma of lung, stage 3 ([place]) 04/30/2015  . Prediabetes   . Seasonal allergies   . Shortness of breath dyspnea   . Sleep apnea     dx'd 10/2014,refused cpap machine  . Urinary frequency   . UTI (urinary tract infection) 06/06/2015    Past Surgical History:  Procedure Laterality Date  . BREAST SURGERY    . CARDIOVERSION    . Cataract surgery    . CESAREAN SECTION  X 2  . COLON SURGERY    . COLONOSCOPY    . COLONOSCOPY N/A 01/13/2017   Procedure: COLONOSCOPY;  Surgeon: Gatha Mayer, MD;  Location: [facility];  Service: Endoscopy;  Laterality: N/A;  . ESOPHAGOGASTRODUODENOSCOPY N/A 01/13/2017   Procedure: ESOPHAGOGASTRODUODENOSCOPY (EGD);  Surgeon: Gatha Mayer, MD;  Location: [facility];  Service: Endoscopy;  Laterality: N/A;  . LAPAROSCOPIC PARTIAL COLECTOMY N/A 02/08/2017   Procedure: LAPAROSCOPIC PARTIAL COLECTOMY;  Surgeon: Leighton Ruff, MD;  Location: [facility] ORS;  Service: General;  Laterality: N/A;  .  MASTECTOMY Right   . MEDIASTINOSCOPY N/A 04/28/2015   Procedure: MEDIASTINOSCOPY;  Surgeon: [name], MD;  Location: [facility] OR;  Service: Thoracic;  Laterality: N/A;  . TONSILLECTOMY         History of present illness and  Hospital Course:     Kindly see H&P for history of present illness and admission details, please review complete Labs, Consult reports and Test reports for all details in brief  HPI  from the history and physical done on the day of admission 12/12/2018 HPI: Pamela Mcguire is a 84 y.o. female with medical history significant of A. fib, CAD, depression, hypertension, hyperlipidemia, GERD presenting to the hospital via EMS from her nursing home for evaluation of near syncope.  In the ED, patient went into A. fib with RVR with heart rate in the 130s to 140s.  She complained of left anterior chest pain and increased shortness of breath.  EKG revealed acute  ischemic changes.  Case was discussed by ED provider with Dr. Alvester Chou from cardiology who did not find this suggestive of acute STEMI.  Recommended serial troponins and admission to medicine service.  Patient states she was walking to her bathroom and lost balance and fell.  Denies losing consciousness.  States she did hit her head when she fell.  She has been experiencing pain in her left knee and left hip since the fall.  States she is always lightheaded and Mcguire of breath.  Denies having any chest pain/pressure prior to the fall.  States sometimes she has chest pain but only when coughing.  States her appetite is good but she does not drink much fluid.  She takes Lasix every day.  No additional history could be obtained from the patient.    Hospital Course    Angelisse Lawal is an 84 y.o. female with medical history significant ofA. fib, CAD, depression, hypertension, hyperlipidemia, GERD, recurrent pleural effusion, lung cancer following with Dr. Earlie Server, presenting to the hospital via EMS from HOME for evaluation of syncope.,  Her work-up was significant for orthostasis, low blood pressure, she was admitted for further work-up.   Near-syncope -This is most likely in the setting of orthostasis, as story is consistent with it specially with recently her lasix has been increased in outpatient setting. -2D echo with no evidence of regional wall motion abnormalities, and preserved EF -She is extremely frail, and deconditioned, and with orthostasis , volume management is very tricky, being had recurrent pleural effusion and orthostasis, currently if she is slightly dry, she will become orthostatic, and if she is a slightly wet she will have worsening of her pleural effusion, I have discussed with family, patient will need to be waited every day, hold her Lasix if she has drop in her weight(dry), and to take extra Lasix if she has significant weight gain(wet) -Continue with thigh-high TED  hose -Started on Midodrin   Persistent pleural effusion -At this point appears to be at baseline, do not think is contributing to her hypoxia or dyspnea, given its small size, no indication for thoracentesis . Per PCCM from last admission: Impression/Plan: Persistent pleural effusion on the right: this has been present for months. As such it will never go away. I don't think it had anything to do with her pathology from this admission as she was admitted with a left sided CAP from H. Flu.  > would not perform thoracentesis again > monitor with repeat imaging as coordinating for cancer surveillance in oncology clinic Her hemoptysis bothers  me because of her history of lung cancer. I would like to perform a bronchoscopy but I think she is too frail to go through that right now as she continues to recover from CAP. > hold eliquis > complete treatment for CAP > needs f/u with Dr. Earlie Server and likely PET scan > if she gains strength we can perform a bronchoscopy  H/o lung cancer -per family Dr. Inda Merlin has told them there is nothing that can be done -last note from D. Mohammad: ) Stage IIIA non-small cell lung cancer, adenocarcinoma status post concurrent chemoradiation with partial response and she was not considered a good candidate for consolidation chemotherapy. The patient is currently on observation. The patient has a repeat CT scan of the chest that showed no concerning findings for disease progression except for the enlarging right pleural effusion. I discussed the scan results with the patient and her daughter and recommended for her to proceed with ultrasound-guided right thoracentesis for diagnostic and therapeutic purposes.  If the cytology of the pleural fluid is negative for malignancy, I will see her back for follow-up visit in 6 months for evaluation with repeat CT scan of the chest. 2) stage II (T3, N0, Mx) colon adenocarcinoma status post partial colectomy with lymph node  dissection.The patient will continue on observation for now. She was advised to call immediately if she has any concerning symptoms in the interval.  Skin tear -WOC consult  Chronic respiratory failure due to hypoxia -continue home O2, at 2 L baseline  Hypokalemia -replete  Left knee pain secondary to fall X-ray showing small joint effusion; no acute fracture or dislocation. -Tylenol PRN -trial of voltaren gel  Left hip pain secondary to fall X-ray showing no acute fracture or dislocation. -Tylenol PRN  Paroxysmal A. fib - rate controlled with heart rate in the 80s.  -Continue home Eliquis, digoxin -holding parameters on diltiazem.  Hyperlipidemia -Continue home statin  GERD -Continue PPI  Depression -Continue Zoloft  Goals of care: -Shunt was seen by palliative medicine, plan to discharge home with hospice services  Discharge Condition:  Stable/hospice   Follow UP  Follow-up Information    [facility].   Contact information: [place] [id number] [phone]            Discharge Instructions  and  Discharge Medications     Discharge Instructions    Discharge instructions   Complete by:  As directed    Follow with Primary MD Hoyt Koch, MD or hospice physician   Activity: As tolerated with Full fall precautions use walker/cane & assistance as needed   Disposition Home    Diet: Heart Healthy  , with feeding assistance and aspiration precautions.  For Heart failure patients - Check your Weight same time everyday, if you gain over 2 pounds, or you develop in leg swelling, experience more shortness of breath or chest pain, call your Primary MD immediately. Follow Cardiac Low Salt Diet and 1.5 lit/day fluid restriction.   On your next visit with your primary care physician please Get Medicines reviewed and adjusted.   Please request your Prim.MD to go  over all Hospital Tests and Procedure/Radiological results at the follow up, please get all Hospital records sent to your Prim MD by signing hospital release before you go home.   If you experience worsening of your admission symptoms, develop shortness of breath, life threatening emergency, suicidal or homicidal thoughts you must seek medical attention immediately  by calling 911 or calling your MD immediately  if symptoms less severe.  You Must read complete instructions/literature along with all the possible adverse reactions/side effects for all the Medicines you take and that have been prescribed to you. Take any new Medicines after you have completely understood and accpet all the possible adverse reactions/side effects.   Do not drive, operating heavy machinery, perform activities at heights, swimming or participation in water activities or provide baby sitting services if your were admitted for syncope or siezures until you have seen by Primary MD or a Neurologist and advised to do so again.  Do not drive when taking Pain medications.    Do not take more than prescribed Pain, Sleep and Anxiety Medications  Special Instructions: If you have smoked or chewed Tobacco  in the last 2 yrs please stop smoking, stop any regular Alcohol  and or any Recreational drug use.  Wear Seat belts while driving.   Please note  You were cared for by a hospitalist during your hospital stay. If you have any questions about your discharge medications or the care you received while you were in the hospital after you are discharged, you can call the unit and asked to speak with the hospitalist on call if the hospitalist that took care of you is not available. Once you are discharged, your primary care physician will handle any further medical issues. Please note that NO REFILLS for any discharge medications will be authorized once you are discharged, as it is imperative that you return to your primary care  physician (or establish a relationship with a primary care physician if you do not have one) for your aftercare needs so that they can reassess your need for medications and monitor your lab values.     Allergies as of 12/16/2018   No Known Allergies     Medication List    TAKE these medications   acetaminophen 325 MG tablet Commonly known as:  TYLENOL Take 2 tablets (650 mg total) by mouth every 6 (six) hours as needed for mild pain (or Fever >/= 101).   albuterol 108 (90 Base) MCG/ACT inhaler Commonly known as:  PROVENTIL HFA;VENTOLIN HFA Inhale 1 puff into the lungs every 6 (six) hours as needed for wheezing or shortness of breath. Reported on 06/01/2016   apixaban 5 MG Tabs tablet Commonly known as:  ELIQUIS Take 1 tablet (5 mg total) by mouth 2 (two) times daily.   atorvastatin 40 MG tablet Commonly known as:  LIPITOR TAKE 1 TABLET(40 MG) BY MOUTH DAILY What changed:  See the new instructions.   benzonatate 100 MG capsule Commonly known as:  TESSALON Take 1 capsule (100 mg total) by mouth 3 (three) times daily as needed for cough.   DIGOX 0.125 MG tablet Generic drug:  digoxin TAKE 1 TABLET(0.125 MG) BY MOUTH DAILY What changed:  See the new instructions.   diltiazem 120 MG 24 hr capsule Commonly known as:  CARDIZEM CD Take 1 capsule (120 mg total) by mouth daily.   ferrous sulfate 325 (65 FE) MG tablet Take 325 mg by mouth every evening.   furosemide 40 MG tablet Commonly known as:  LASIX Please start Lasix on 12/18/2018, start 40 mg oral daily, 20 mg at bedtime. What changed:    medication strength  how much to take  how to take this  when to take this  additional instructions   guaiFENesin 600 MG 12 hr tablet Commonly known as:  MUCINEX Take 1  tablet (600 mg total) by mouth 2 (two) times daily.   HYDROcodone-homatropine 5-1.5 MG/5ML syrup Commonly known as:  HYCODAN Take 5 mLs by mouth every 4 (four) hours as needed for cough (SOB). What changed:   reasons to take this   ipratropium-albuterol 0.5-2.5 (3) MG/3ML Soln Commonly known as:  DUONEB Take 3 mLs by nebulization 4 (four) times daily as needed. What changed:    when to take this  reasons to take this   lactose free nutrition Liqd Take 237 mLs by mouth daily as needed (for supplementation).   loratadine 10 MG tablet Commonly known as:  CLARITIN TAKE 1 TABLET BY MOUTH AT BEDTIME   magnesium gluconate 30 MG tablet Commonly known as:  MAGONATE Take 1 tablet (30 mg total) by mouth daily.   midodrine 5 MG tablet Commonly known as:  PROAMATINE Take 1 tablet (5 mg total) by mouth 2 (two) times daily with a meal.   mirtazapine 15 MG tablet Commonly known as:  REMERON TAKE 1 TABLET(15 MG) BY MOUTH AT BEDTIME What changed:  See the new instructions.   MULTIVITAMIN GUMMIES ADULT Chew Chew 1 tablet by mouth daily.   OXYGEN Inhale 2-3 L into the lungs continuous.   pantoprazole 40 MG tablet Commonly known as:  PROTONIX TAKE 1 TABLET(40 MG) BY MOUTH DAILY What changed:  See the new instructions.   potassium chloride SA 20 MEQ tablet Commonly known as:  K-DUR,KLOR-CON Take 2 tablets (40 mEq total) by mouth every Monday, Wednesday, and Friday.   sertraline 25 MG tablet Commonly known as:  ZOLOFT Take 1-2 tablets (25-50 mg total) by mouth daily. What changed:  how much to take   tolterodine 4 MG 24 hr capsule Commonly known as:  DETROL LA Take 1 capsule (4 mg total) by mouth daily. What changed:  when to take this         Diet and Activity recommendation: See Discharge Instructions above   Consults obtained -  palliative   Major procedures and Radiology Reports - PLEASE review detailed and final reports for all details, in brief -      Dg Knee 2 Views Left  Result Date: 12/12/2018 CLINICAL DATA:  84 year old female status post fall with pain. EXAM: LEFT KNEE - 1-2 VIEW COMPARISON:  None. FINDINGS: Small joint effusion on the cross-table lateral  view. No fat fluid level identified. Patella appears intact. No fracture or dislocation identified. Joint spaces appear normal for age. IMPRESSION: Small joint effusion. No acute fracture or dislocation identified about the left knee. Electronically Signed   By: Genevie Ann M.D.   On: 12/12/2018 17:46   Ct Head Wo Contrast  Result Date: 12/12/2018 CLINICAL DATA:  84 year old female with dizziness and near syncopal event resulting in a fall earlier today. She has a posterior scalp hematoma. EXAM: CT HEAD WITHOUT CONTRAST TECHNIQUE: Contiguous axial images were obtained from the base of the skull through the vertex without intravenous contrast. COMPARISON:  Prior brain MRI 11/08/2018 FINDINGS: Brain: No evidence of acute infarction, hemorrhage, hydrocephalus, extra-axial collection or mass lesion/mass effect. Mild cortical atrophy. Multiple circumscribed low-attenuation lesions present throughout the left basal ganglia with perhaps a few on the right. These may represent dilated perivascular spaces or remote lacunar infarct. Chronic microvascular ischemic white matter disease again noted. Vascular: No hyperdense vessel or unexpected calcification. Skull: Normal. Negative for fracture or focal lesion. Sinuses/Orbits: No acute finding. Other: None. IMPRESSION: 1. No acute intracranial abnormality. 2. Multifocal subacute in remote punctate lacunar infarcts,  cortical atrophy and chronic microvascular ischemic white matter disease. Electronically Signed   By: Jacqulynn Cadet M.D.   On: 12/12/2018 17:28   Dg Chest Port 1 View  Result Date: 12/13/2018 CLINICAL DATA:  Dyspnea EXAM: PORTABLE CHEST 1 VIEW COMPARISON:  12/12/2018 FINDINGS: Cardiac shadow is stable. Aortic calcifications are again seen. Persistent elevation of the right hemidiaphragm is noted consistent with the sub pulmonic effusion. Chronic fibrotic changes are noted most marked in the right upper lobe. No new focal abnormality is seen. IMPRESSION: Stable  appearance of the chest when compared with the previous day. Electronically Signed   By: Inez Catalina M.D.   On: 12/13/2018 16:01   Dg Chest Port 1 View  Result Date: 12/12/2018 CLINICAL DATA:  84 year old female with history of dizziness. Near syncopal event today. Fall. EXAM: PORTABLE CHEST 1 VIEW COMPARISON:  Chest x-ray 11/06/2018. FINDINGS: Chronic elevation of the right hemidiaphragm, unchanged. Skinfold artifact over the lateral aspect of the right hemithorax incidentally noted. Widespread but heterogeneous distribution of interstitial prominence throughout the lungs bilaterally, similar to the prior study. No acute consolidative airspace disease. No pleural effusions. Probable subpulmonic right-sided pleural effusion, similar to priors. No left pleural effusion. No evidence of pulmonary edema. Heart size is normal. Aortic atherosclerosis. IMPRESSION: 1. Chronic right-sided small sub pulmonic pleural effusion, similar to the prior study. 2. Aortic atherosclerosis. Electronically Signed   By: Vinnie Langton M.D.   On: 12/12/2018 17:01   Dg Hip Unilat With Pelvis 2-3 Views Left  Result Date: 12/12/2018 CLINICAL DATA:  84 year old female status post fall with pain. EXAM: DG HIP (WITH OR WITHOUT PELVIS) 2-3V LEFT COMPARISON:  CT Abdomen and Pelvis 10/30/2018 and earlier. FINDINGS: Bone mineralization is within normal limits for age. Femoral heads are normally located. Hip joint spaces appear symmetric and normal for age. Grossly intact proximal right femur. The proximal left femur appears intact. No pelvic fracture identified. Negative visible bowel gas pattern, lower abdominal and pelvic visceral contours. IMPRESSION: No acute fracture or dislocation identified about the left hip or pelvis. Electronically Signed   By: Genevie Ann M.D.   On: 12/12/2018 17:45    Micro Results    No results found for this or any previous visit (from the past 240 hour(s)).     Today   Subjective:   [name]  today ports she still feeling frail, weak, reports some cough, with productive phlegm .   Objective:   Blood pressure (!) 115/57, pulse 87, temperature 97.6 F (36.4 C), temperature source Oral, resp. rate 18, height 5\' 6"  (1.676 m), weight 65.8 kg, SpO2 96 %.   Intake/Output Summary (Last 24 hours) at 12/16/2018 1306 Last data filed at 12/16/2018 0941 Gross per 24 hour  Intake 1104.17 ml  Output 300 ml  Net 804.17 ml    Exam Awake Alert, Oriented x 3, terribly frail, chronically ill-appearing Symmetrical Chest wall movement, Good air movement bilaterally, diminished air entry at the bases RRR,No Gallops,Rubs or new Murmurs, No Parasternal Heave +ve B.Sounds, Abd Soft, Non tender, No rebound -guarding or rigidity. No Cyanosis, Clubbing or edema, No new Rash or bruise  Data Review   CBC w Diff:  Lab Results  Component Value Date   WBC 6.2 12/14/2018   HGB 11.9 (L) 12/14/2018   HGB 13.5 05/01/2018   HGB 13.6 07/04/2017   HCT 35.7 (L) 12/14/2018   HCT 41.8 07/04/2017   PLT 306 12/14/2018   PLT 325 05/01/2018   PLT 299 07/04/2017  LYMPHOPCT 11 12/12/2018   LYMPHOPCT 12.2 (L) 07/04/2017   MONOPCT 5 12/12/2018   MONOPCT 8.1 07/04/2017   EOSPCT 0 12/12/2018   EOSPCT 0.9 07/04/2017   BASOPCT 0 12/12/2018   BASOPCT 0.5 07/04/2017    CMP:  Lab Results  Component Value Date   NA 139 12/14/2018   NA 140 07/04/2017   K 4.2 12/14/2018   K 5.0 07/04/2017   CL 100 12/14/2018   CO2 29 12/14/2018   CO2 28 07/04/2017   BUN 11 12/14/2018   BUN 16.8 07/04/2017   CREATININE 0.94 12/14/2018   CREATININE 1.02 05/01/2018   CREATININE 1.1 07/04/2017   GLU 123 02/17/2017   PROT 6.0 (L) 12/14/2018   PROT 7.1 07/04/2017   ALBUMIN 2.7 (L) 12/14/2018   ALBUMIN 3.6 07/04/2017   BILITOT 0.5 12/14/2018   BILITOT 0.6 05/01/2018   BILITOT 0.49 07/04/2017   ALKPHOS 48 12/14/2018   ALKPHOS 64 07/04/2017   AST 24 12/14/2018   AST 20 05/01/2018   AST 15 07/04/2017   ALT 16  12/14/2018   ALT 19 05/01/2018   ALT 15 07/04/2017  .   Total Time in preparing paper work, data evaluation and todays exam - 39 minutes  Phillips Climes M.D on 12/16/2018 at 1:06 PM  [facility]   Office  [phone]

## 2018-12-17 ENCOUNTER — Telehealth: Payer: Self-pay | Admitting: Internal Medicine

## 2018-12-17 ENCOUNTER — Telehealth: Payer: Self-pay | Admitting: *Deleted

## 2018-12-17 NOTE — Telephone Encounter (Signed)
Pt was on TCM report admitted 12/12/18 for evaluation of near syncope. While in ED pt went into A-fib w/RVR w/HR in the 130's-140's. Pt c/o (L) anterior chest pain and SOB sxs. EKG revealed acute ischemic changes. Pt D/C 12/16/18 to hospice services.Marland KitchenJohny Chess

## 2018-12-17 NOTE — Telephone Encounter (Signed)
Informed Delsa Sale that Dr. Sharlet Salina will be the attending

## 2018-12-17 NOTE — Telephone Encounter (Signed)
Copied from [place] [id number]. Topic: Quick Communication - See Telephone Encounter >> Dec 17, 2018  8:57 AM Rayann Heman wrote: CRM for notification. See Telephone encounter for: 12/17/18. Jen from [facility] would like to know if Dr Sharlet Salina will be attending of record. Please advise

## 2018-12-18 DIAGNOSIS — I480 Paroxysmal atrial fibrillation: Secondary | ICD-10-CM

## 2018-12-18 DIAGNOSIS — Z8673 Personal history of transient ischemic attack (TIA), and cerebral infarction without residual deficits: Secondary | ICD-10-CM

## 2018-12-18 DIAGNOSIS — G4733 Obstructive sleep apnea (adult) (pediatric): Secondary | ICD-10-CM

## 2018-12-18 DIAGNOSIS — I110 Hypertensive heart disease with heart failure: Secondary | ICD-10-CM

## 2018-12-18 DIAGNOSIS — I5032 Chronic diastolic (congestive) heart failure: Secondary | ICD-10-CM

## 2018-12-18 DIAGNOSIS — Z9981 Dependence on supplemental oxygen: Secondary | ICD-10-CM

## 2018-12-18 DIAGNOSIS — F0390 Unspecified dementia without behavioral disturbance: Secondary | ICD-10-CM

## 2018-12-18 DIAGNOSIS — Z7901 Long term (current) use of anticoagulants: Secondary | ICD-10-CM

## 2018-12-18 DIAGNOSIS — C3411 Malignant neoplasm of upper lobe, right bronchus or lung: Secondary | ICD-10-CM

## 2018-12-18 DIAGNOSIS — F329 Major depressive disorder, single episode, unspecified: Secondary | ICD-10-CM

## 2018-12-18 DIAGNOSIS — J441 Chronic obstructive pulmonary disease with (acute) exacerbation: Secondary | ICD-10-CM | POA: Diagnosis not present

## 2018-12-18 DIAGNOSIS — J9621 Acute and chronic respiratory failure with hypoxia: Secondary | ICD-10-CM | POA: Diagnosis not present

## 2018-12-18 DIAGNOSIS — I2510 Atherosclerotic heart disease of native coronary artery without angina pectoris: Secondary | ICD-10-CM

## 2018-12-18 DIAGNOSIS — M47815 Spondylosis without myelopathy or radiculopathy, thoracolumbar region: Secondary | ICD-10-CM

## 2018-12-25 ENCOUNTER — Telehealth: Payer: Self-pay

## 2018-12-25 NOTE — Telephone Encounter (Signed)
Gae Bon from hospice calling to ask about orders that were signed for [facility] in regards to no added salt or restrictions on fluid intake. She was asking if this was something that needed to be met because [facility] stated they wouldn't be able to meet those demands. Since they are assisted living there are salt shakers available on the tables and patients are able to get their own beverages. Abigail Butts faxing over forms with notations to look over and sign. Patient moving in 12/26/2018 at 1200pm

## 2018-12-26 ENCOUNTER — Telehealth: Payer: Self-pay | Admitting: Internal Medicine

## 2018-12-26 NOTE — Telephone Encounter (Signed)
Will route to office for final disposition; pt last seen by Dr Pricilla Holm, [facility], 12/11/2018.

## 2018-12-26 NOTE — Telephone Encounter (Signed)
Copied from [place] [id number]. Topic: Quick Communication - Rx Refill/Question >> Dec 26, 2018  1:44 PM Reyne Dumas L wrote: Medication:  furosemide (LASIX) 40 MG tablet  Bryan with [facility] calling for clarification on this RX.  States pt has just moved into assisted living and they are trying to pill pack medication for her.  Gaspar Bidding can be reached at [phone].

## 2018-12-27 NOTE — Telephone Encounter (Signed)
LVM for Pamela Mcguire explaining we do not fill this medication for patient and they would have to call her cardiologist to get clarification for said medication

## 2019-01-01 ENCOUNTER — Ambulatory Visit: Payer: Medicare HMO | Admitting: Internal Medicine

## 2019-01-09 ENCOUNTER — Telehealth: Payer: Self-pay | Admitting: Medical Oncology

## 2019-01-09 NOTE — Telephone Encounter (Signed)
-----   Message from Curt Bears, MD sent at 11/13/2018  1:29 PM EST ----- Regarding: RE: f/u She had a lot of work-up done during her hospitalization.  I saw the patient briefly for social visit when she was in the hospital.  No urgent follow-up visit is needed.  We can see her after she gets discharged from the rehab center.  Thank you. ----- Message ----- From: Nicholaus Corolla Sent: 11/13/2018  11:55 AM EST To: Lucile Crater, RN, Curt Bears, MD, # Subject: f/u                                            F/u for tomorrow cancelled - per daughter pt is still in rehab center - not sure how far out to r/s appt - pts daughter wanted to see what you suggested    Let me know what I need to r/s please . (:

## 2019-01-09 NOTE — Telephone Encounter (Signed)
Spoke to [place] [place] [facility] ( dtr) and pt fell yesterday . She is out of rehab. [name] said she will call back to r/s f/u when pt is able.

## 2019-06-12 DIAGNOSIS — R9431 Abnormal electrocardiogram [ECG] [EKG]: Secondary | ICD-10-CM | POA: Diagnosis not present

## 2019-06-12 DIAGNOSIS — R079 Chest pain, unspecified: Secondary | ICD-10-CM | POA: Diagnosis not present

## 2019-06-27 ENCOUNTER — Other Ambulatory Visit: Payer: Self-pay | Admitting: Internal Medicine

## 2019-09-07 DIAGNOSIS — J449 Chronic obstructive pulmonary disease, unspecified: Secondary | ICD-10-CM | POA: Diagnosis not present

## 2019-09-07 DIAGNOSIS — C3411 Malignant neoplasm of upper lobe, right bronchus or lung: Secondary | ICD-10-CM | POA: Diagnosis not present

## 2019-09-24 DIAGNOSIS — Z9981 Dependence on supplemental oxygen: Secondary | ICD-10-CM | POA: Diagnosis not present

## 2019-09-24 DIAGNOSIS — C3490 Malignant neoplasm of unspecified part of unspecified bronchus or lung: Secondary | ICD-10-CM | POA: Diagnosis not present

## 2019-09-24 DIAGNOSIS — R042 Hemoptysis: Secondary | ICD-10-CM | POA: Diagnosis not present

## 2019-09-24 DIAGNOSIS — R0789 Other chest pain: Secondary | ICD-10-CM | POA: Diagnosis not present

## 2019-10-08 DIAGNOSIS — J449 Chronic obstructive pulmonary disease, unspecified: Secondary | ICD-10-CM | POA: Diagnosis not present

## 2019-10-08 DIAGNOSIS — C3411 Malignant neoplasm of upper lobe, right bronchus or lung: Secondary | ICD-10-CM | POA: Diagnosis not present

## 2019-10-27 DIAGNOSIS — C3411 Malignant neoplasm of upper lobe, right bronchus or lung: Secondary | ICD-10-CM | POA: Diagnosis not present

## 2019-10-27 DIAGNOSIS — J449 Chronic obstructive pulmonary disease, unspecified: Secondary | ICD-10-CM | POA: Diagnosis not present

## 2019-10-29 DIAGNOSIS — Z03818 Encounter for observation for suspected exposure to other biological agents ruled out: Secondary | ICD-10-CM | POA: Diagnosis not present

## 2019-11-07 DIAGNOSIS — J449 Chronic obstructive pulmonary disease, unspecified: Secondary | ICD-10-CM | POA: Diagnosis not present

## 2019-11-07 DIAGNOSIS — C3411 Malignant neoplasm of upper lobe, right bronchus or lung: Secondary | ICD-10-CM | POA: Diagnosis not present

## 2019-11-11 DIAGNOSIS — Z03818 Encounter for observation for suspected exposure to other biological agents ruled out: Secondary | ICD-10-CM | POA: Diagnosis not present

## 2019-11-18 DIAGNOSIS — Z03818 Encounter for observation for suspected exposure to other biological agents ruled out: Secondary | ICD-10-CM | POA: Diagnosis not present

## 2019-11-26 DIAGNOSIS — Z03818 Encounter for observation for suspected exposure to other biological agents ruled out: Secondary | ICD-10-CM | POA: Diagnosis not present

## 2019-11-26 DIAGNOSIS — C3411 Malignant neoplasm of upper lobe, right bronchus or lung: Secondary | ICD-10-CM | POA: Diagnosis not present

## 2019-11-26 DIAGNOSIS — J449 Chronic obstructive pulmonary disease, unspecified: Secondary | ICD-10-CM | POA: Diagnosis not present

## 2019-11-27 DIAGNOSIS — Z9981 Dependence on supplemental oxygen: Secondary | ICD-10-CM | POA: Diagnosis not present

## 2019-11-27 DIAGNOSIS — R42 Dizziness and giddiness: Secondary | ICD-10-CM | POA: Diagnosis not present

## 2019-11-27 DIAGNOSIS — R0789 Other chest pain: Secondary | ICD-10-CM | POA: Diagnosis not present

## 2019-11-27 DIAGNOSIS — C3490 Malignant neoplasm of unspecified part of unspecified bronchus or lung: Secondary | ICD-10-CM | POA: Diagnosis not present

## 2019-11-28 DIAGNOSIS — N390 Urinary tract infection, site not specified: Secondary | ICD-10-CM | POA: Diagnosis not present

## 2019-11-28 DIAGNOSIS — Z79899 Other long term (current) drug therapy: Secondary | ICD-10-CM | POA: Diagnosis not present

## 2019-11-29 DIAGNOSIS — C3411 Malignant neoplasm of upper lobe, right bronchus or lung: Secondary | ICD-10-CM | POA: Diagnosis not present

## 2019-12-06 DIAGNOSIS — Z23 Encounter for immunization: Secondary | ICD-10-CM | POA: Diagnosis not present

## 2019-12-08 DIAGNOSIS — C3411 Malignant neoplasm of upper lobe, right bronchus or lung: Secondary | ICD-10-CM | POA: Diagnosis not present

## 2019-12-08 DIAGNOSIS — J449 Chronic obstructive pulmonary disease, unspecified: Secondary | ICD-10-CM | POA: Diagnosis not present

## 2019-12-11 DIAGNOSIS — C3490 Malignant neoplasm of unspecified part of unspecified bronchus or lung: Secondary | ICD-10-CM | POA: Diagnosis not present

## 2019-12-11 DIAGNOSIS — R0789 Other chest pain: Secondary | ICD-10-CM | POA: Diagnosis not present

## 2019-12-11 DIAGNOSIS — N3001 Acute cystitis with hematuria: Secondary | ICD-10-CM | POA: Diagnosis not present

## 2019-12-11 DIAGNOSIS — Z9981 Dependence on supplemental oxygen: Secondary | ICD-10-CM | POA: Diagnosis not present

## 2019-12-11 DIAGNOSIS — Z03818 Encounter for observation for suspected exposure to other biological agents ruled out: Secondary | ICD-10-CM | POA: Diagnosis not present

## 2019-12-16 DIAGNOSIS — Z03818 Encounter for observation for suspected exposure to other biological agents ruled out: Secondary | ICD-10-CM | POA: Diagnosis not present

## 2019-12-18 DIAGNOSIS — Z03818 Encounter for observation for suspected exposure to other biological agents ruled out: Secondary | ICD-10-CM | POA: Diagnosis not present

## 2019-12-23 DIAGNOSIS — Z03818 Encounter for observation for suspected exposure to other biological agents ruled out: Secondary | ICD-10-CM | POA: Diagnosis not present

## 2019-12-25 DIAGNOSIS — Z03818 Encounter for observation for suspected exposure to other biological agents ruled out: Secondary | ICD-10-CM | POA: Diagnosis not present

## 2019-12-27 DIAGNOSIS — C3411 Malignant neoplasm of upper lobe, right bronchus or lung: Secondary | ICD-10-CM | POA: Diagnosis not present

## 2019-12-27 DIAGNOSIS — J449 Chronic obstructive pulmonary disease, unspecified: Secondary | ICD-10-CM | POA: Diagnosis not present

## 2019-12-30 DIAGNOSIS — Z03818 Encounter for observation for suspected exposure to other biological agents ruled out: Secondary | ICD-10-CM | POA: Diagnosis not present

## 2019-12-30 DIAGNOSIS — C3411 Malignant neoplasm of upper lobe, right bronchus or lung: Secondary | ICD-10-CM | POA: Diagnosis not present

## 2020-01-07 ENCOUNTER — Other Ambulatory Visit: Payer: Self-pay | Admitting: Internal Medicine

## 2020-01-08 DIAGNOSIS — C3411 Malignant neoplasm of upper lobe, right bronchus or lung: Secondary | ICD-10-CM | POA: Diagnosis not present

## 2020-01-08 DIAGNOSIS — J449 Chronic obstructive pulmonary disease, unspecified: Secondary | ICD-10-CM | POA: Diagnosis not present

## 2020-01-13 DIAGNOSIS — Z03818 Encounter for observation for suspected exposure to other biological agents ruled out: Secondary | ICD-10-CM | POA: Diagnosis not present

## 2020-01-20 DIAGNOSIS — Z03818 Encounter for observation for suspected exposure to other biological agents ruled out: Secondary | ICD-10-CM | POA: Diagnosis not present

## 2020-01-26 DIAGNOSIS — C3411 Malignant neoplasm of upper lobe, right bronchus or lung: Secondary | ICD-10-CM | POA: Diagnosis not present

## 2020-01-26 DIAGNOSIS — J449 Chronic obstructive pulmonary disease, unspecified: Secondary | ICD-10-CM | POA: Diagnosis not present

## 2020-01-27 DIAGNOSIS — C3411 Malignant neoplasm of upper lobe, right bronchus or lung: Secondary | ICD-10-CM | POA: Diagnosis not present

## 2020-02-05 DIAGNOSIS — J449 Chronic obstructive pulmonary disease, unspecified: Secondary | ICD-10-CM | POA: Diagnosis not present

## 2020-02-05 DIAGNOSIS — C3411 Malignant neoplasm of upper lobe, right bronchus or lung: Secondary | ICD-10-CM | POA: Diagnosis not present

## 2020-02-12 DIAGNOSIS — Z03818 Encounter for observation for suspected exposure to other biological agents ruled out: Secondary | ICD-10-CM | POA: Diagnosis not present

## 2020-02-20 DIAGNOSIS — Z03818 Encounter for observation for suspected exposure to other biological agents ruled out: Secondary | ICD-10-CM | POA: Diagnosis not present

## 2020-02-24 DIAGNOSIS — C3411 Malignant neoplasm of upper lobe, right bronchus or lung: Secondary | ICD-10-CM | POA: Diagnosis not present

## 2020-02-24 DIAGNOSIS — J449 Chronic obstructive pulmonary disease, unspecified: Secondary | ICD-10-CM | POA: Diagnosis not present

## 2020-02-26 DIAGNOSIS — Z03818 Encounter for observation for suspected exposure to other biological agents ruled out: Secondary | ICD-10-CM | POA: Diagnosis not present

## 2020-02-27 DIAGNOSIS — C3411 Malignant neoplasm of upper lobe, right bronchus or lung: Secondary | ICD-10-CM | POA: Diagnosis not present

## 2020-03-07 DIAGNOSIS — C3411 Malignant neoplasm of upper lobe, right bronchus or lung: Secondary | ICD-10-CM | POA: Diagnosis not present

## 2020-03-07 DIAGNOSIS — J449 Chronic obstructive pulmonary disease, unspecified: Secondary | ICD-10-CM | POA: Diagnosis not present

## 2020-04-28 DEATH — deceased
